# Patient Record
Sex: Female | Born: 1940 | Race: White | Hispanic: No | Marital: Married | Smoking: Former smoker
Health system: Southern US, Community
[De-identification: ages and names within clinical notes are randomized; demographics above are authoritative.]

## PROBLEM LIST (undated history)

## (undated) DIAGNOSIS — I10 Essential (primary) hypertension: Secondary | ICD-10-CM

## (undated) DIAGNOSIS — Z9842 Cataract extraction status, left eye: Secondary | ICD-10-CM

## (undated) DIAGNOSIS — E079 Disorder of thyroid, unspecified: Secondary | ICD-10-CM

## (undated) DIAGNOSIS — I4891 Unspecified atrial fibrillation: Secondary | ICD-10-CM

## (undated) DIAGNOSIS — I219 Acute myocardial infarction, unspecified: Secondary | ICD-10-CM

## (undated) DIAGNOSIS — Z7901 Long term (current) use of anticoagulants: Secondary | ICD-10-CM

## (undated) DIAGNOSIS — H409 Unspecified glaucoma: Secondary | ICD-10-CM

## (undated) DIAGNOSIS — M5030 Other cervical disc degeneration, unspecified cervical region: Secondary | ICD-10-CM

## (undated) DIAGNOSIS — M1990 Unspecified osteoarthritis, unspecified site: Secondary | ICD-10-CM

## (undated) DIAGNOSIS — I700 Atherosclerosis of aorta: Secondary | ICD-10-CM

## (undated) DIAGNOSIS — E785 Hyperlipidemia, unspecified: Secondary | ICD-10-CM

## (undated) DIAGNOSIS — Z9889 Other specified postprocedural states: Secondary | ICD-10-CM

## (undated) DIAGNOSIS — F32A Depression, unspecified: Secondary | ICD-10-CM

## (undated) DIAGNOSIS — D124 Benign neoplasm of descending colon: Secondary | ICD-10-CM

## (undated) DIAGNOSIS — T8859XA Other complications of anesthesia, initial encounter: Secondary | ICD-10-CM

## (undated) DIAGNOSIS — I2510 Atherosclerotic heart disease of native coronary artery without angina pectoris: Secondary | ICD-10-CM

## (undated) DIAGNOSIS — C801 Malignant (primary) neoplasm, unspecified: Secondary | ICD-10-CM

## (undated) DIAGNOSIS — R0600 Dyspnea, unspecified: Secondary | ICD-10-CM

## (undated) DIAGNOSIS — Z9841 Cataract extraction status, right eye: Secondary | ICD-10-CM

## (undated) DIAGNOSIS — R51 Headache: Secondary | ICD-10-CM

## (undated) DIAGNOSIS — F329 Major depressive disorder, single episode, unspecified: Secondary | ICD-10-CM

## (undated) DIAGNOSIS — Z951 Presence of aortocoronary bypass graft: Secondary | ICD-10-CM

## (undated) DIAGNOSIS — U071 COVID-19: Secondary | ICD-10-CM

## (undated) DIAGNOSIS — I639 Cerebral infarction, unspecified: Secondary | ICD-10-CM

## (undated) DIAGNOSIS — L570 Actinic keratosis: Secondary | ICD-10-CM

## (undated) DIAGNOSIS — G8929 Other chronic pain: Secondary | ICD-10-CM

## (undated) DIAGNOSIS — D122 Benign neoplasm of ascending colon: Secondary | ICD-10-CM

## (undated) DIAGNOSIS — T753XXA Motion sickness, initial encounter: Secondary | ICD-10-CM

## (undated) DIAGNOSIS — G43909 Migraine, unspecified, not intractable, without status migrainosus: Secondary | ICD-10-CM

## (undated) DIAGNOSIS — J45909 Unspecified asthma, uncomplicated: Secondary | ICD-10-CM

## (undated) DIAGNOSIS — Z8489 Family history of other specified conditions: Secondary | ICD-10-CM

## (undated) DIAGNOSIS — T4145XA Adverse effect of unspecified anesthetic, initial encounter: Secondary | ICD-10-CM

## (undated) DIAGNOSIS — N183 Chronic kidney disease, stage 3 unspecified: Secondary | ICD-10-CM

## (undated) DIAGNOSIS — G4733 Obstructive sleep apnea (adult) (pediatric): Secondary | ICD-10-CM

## (undated) DIAGNOSIS — R519 Headache, unspecified: Secondary | ICD-10-CM

## (undated) DIAGNOSIS — K219 Gastro-esophageal reflux disease without esophagitis: Secondary | ICD-10-CM

## (undated) DIAGNOSIS — E039 Hypothyroidism, unspecified: Secondary | ICD-10-CM

## (undated) DIAGNOSIS — F419 Anxiety disorder, unspecified: Secondary | ICD-10-CM

## (undated) DIAGNOSIS — R112 Nausea with vomiting, unspecified: Secondary | ICD-10-CM

## (undated) DIAGNOSIS — M797 Fibromyalgia: Secondary | ICD-10-CM

## (undated) DIAGNOSIS — G4730 Sleep apnea, unspecified: Secondary | ICD-10-CM

## (undated) DIAGNOSIS — G4700 Insomnia, unspecified: Secondary | ICD-10-CM

## (undated) DIAGNOSIS — M545 Low back pain, unspecified: Secondary | ICD-10-CM

## (undated) DIAGNOSIS — R9439 Abnormal result of other cardiovascular function study: Secondary | ICD-10-CM

## (undated) DIAGNOSIS — E559 Vitamin D deficiency, unspecified: Secondary | ICD-10-CM

## (undated) HISTORY — PX: APPENDECTOMY: SHX54

## (undated) HISTORY — DX: Malignant (primary) neoplasm, unspecified: C80.1

## (undated) HISTORY — DX: Benign neoplasm of descending colon: D12.4

## (undated) HISTORY — DX: COVID-19: U07.1

## (undated) HISTORY — PX: ABDOMINAL HYSTERECTOMY: SHX81

## (undated) HISTORY — DX: Benign neoplasm of ascending colon: D12.2

## (undated) HISTORY — DX: Depression, unspecified: F32.A

## (undated) HISTORY — PX: RIGHT OOPHORECTOMY: SHX2359

## (undated) HISTORY — PX: CATARACT EXTRACTION W/ INTRAOCULAR LENS  IMPLANT, BILATERAL: SHX1307

## (undated) HISTORY — PX: BLADDER SURGERY: SHX569

## (undated) HISTORY — DX: Essential (primary) hypertension: I10

## (undated) HISTORY — DX: Major depressive disorder, single episode, unspecified: F32.9

## (undated) HISTORY — DX: Unspecified glaucoma: H40.9

## (undated) HISTORY — DX: Cerebral infarction, unspecified: I63.9

## (undated) HISTORY — DX: Disorder of thyroid, unspecified: E07.9

## (undated) HISTORY — DX: Actinic keratosis: L57.0

## (undated) HISTORY — PX: NASAL SINUS SURGERY: SHX719

---

## 1993-03-25 DIAGNOSIS — E785 Hyperlipidemia, unspecified: Secondary | ICD-10-CM

## 1993-03-25 HISTORY — DX: Hyperlipidemia, unspecified: E78.5

## 1993-04-15 DIAGNOSIS — E039 Hypothyroidism, unspecified: Secondary | ICD-10-CM | POA: Insufficient documentation

## 1994-03-23 DIAGNOSIS — M1990 Unspecified osteoarthritis, unspecified site: Secondary | ICD-10-CM | POA: Insufficient documentation

## 2001-09-16 ENCOUNTER — Other Ambulatory Visit: Admission: RE | Admit: 2001-09-16 | Discharge: 2001-09-16 | Payer: Self-pay | Admitting: Family Medicine

## 2004-10-06 ENCOUNTER — Ambulatory Visit: Payer: Self-pay | Admitting: Family Medicine

## 2004-11-14 ENCOUNTER — Ambulatory Visit: Payer: Self-pay | Admitting: General Surgery

## 2005-02-18 ENCOUNTER — Ambulatory Visit: Payer: Self-pay | Admitting: Urology

## 2006-01-20 ENCOUNTER — Ambulatory Visit: Payer: Self-pay | Admitting: Family Medicine

## 2006-10-05 DIAGNOSIS — N959 Unspecified menopausal and perimenopausal disorder: Secondary | ICD-10-CM | POA: Insufficient documentation

## 2006-10-05 DIAGNOSIS — G43909 Migraine, unspecified, not intractable, without status migrainosus: Secondary | ICD-10-CM | POA: Insufficient documentation

## 2006-10-07 DIAGNOSIS — K219 Gastro-esophageal reflux disease without esophagitis: Secondary | ICD-10-CM | POA: Insufficient documentation

## 2007-01-18 ENCOUNTER — Ambulatory Visit: Payer: Self-pay | Admitting: Gastroenterology

## 2007-01-29 DIAGNOSIS — F4320 Adjustment disorder, unspecified: Secondary | ICD-10-CM | POA: Insufficient documentation

## 2007-03-09 DIAGNOSIS — I10 Essential (primary) hypertension: Secondary | ICD-10-CM | POA: Insufficient documentation

## 2007-03-09 HISTORY — DX: Essential (primary) hypertension: I10

## 2007-09-02 ENCOUNTER — Other Ambulatory Visit: Payer: Self-pay

## 2007-09-02 ENCOUNTER — Emergency Department: Payer: Self-pay | Admitting: Emergency Medicine

## 2007-09-29 ENCOUNTER — Encounter: Payer: Self-pay | Admitting: Specialist

## 2007-09-29 DIAGNOSIS — G4700 Insomnia, unspecified: Secondary | ICD-10-CM | POA: Insufficient documentation

## 2007-10-24 ENCOUNTER — Encounter: Payer: Self-pay | Admitting: Specialist

## 2007-11-23 ENCOUNTER — Encounter: Payer: Self-pay | Admitting: Specialist

## 2007-12-24 ENCOUNTER — Encounter: Payer: Self-pay | Admitting: Specialist

## 2008-01-24 ENCOUNTER — Encounter: Payer: Self-pay | Admitting: Specialist

## 2008-02-20 ENCOUNTER — Ambulatory Visit: Payer: Self-pay | Admitting: Family Medicine

## 2008-02-23 ENCOUNTER — Encounter: Payer: Self-pay | Admitting: Specialist

## 2008-04-27 ENCOUNTER — Emergency Department: Payer: Self-pay | Admitting: Emergency Medicine

## 2008-06-09 ENCOUNTER — Observation Stay: Payer: Self-pay | Admitting: Internal Medicine

## 2009-05-03 ENCOUNTER — Ambulatory Visit: Payer: Self-pay | Admitting: Family Medicine

## 2009-05-03 DIAGNOSIS — R7309 Other abnormal glucose: Secondary | ICD-10-CM | POA: Insufficient documentation

## 2009-11-14 DIAGNOSIS — Z86010 Personal history of colonic polyps: Secondary | ICD-10-CM | POA: Insufficient documentation

## 2009-12-31 ENCOUNTER — Ambulatory Visit: Payer: Self-pay | Admitting: Family Medicine

## 2010-03-03 ENCOUNTER — Ambulatory Visit: Payer: Self-pay

## 2010-03-04 ENCOUNTER — Ambulatory Visit: Payer: Self-pay

## 2011-02-03 LAB — HM COLONOSCOPY

## 2011-03-16 ENCOUNTER — Ambulatory Visit: Payer: Self-pay | Admitting: Obstetrics and Gynecology

## 2011-04-10 ENCOUNTER — Ambulatory Visit: Payer: Self-pay | Admitting: Obstetrics and Gynecology

## 2011-04-14 ENCOUNTER — Ambulatory Visit: Payer: Self-pay | Admitting: Obstetrics and Gynecology

## 2011-04-17 LAB — PATHOLOGY REPORT

## 2011-07-31 ENCOUNTER — Ambulatory Visit: Payer: Self-pay | Admitting: Family Medicine

## 2011-07-31 DIAGNOSIS — R509 Fever, unspecified: Secondary | ICD-10-CM | POA: Diagnosis not present

## 2011-07-31 DIAGNOSIS — R059 Cough, unspecified: Secondary | ICD-10-CM | POA: Diagnosis not present

## 2011-07-31 DIAGNOSIS — G43909 Migraine, unspecified, not intractable, without status migrainosus: Secondary | ICD-10-CM | POA: Diagnosis not present

## 2011-07-31 DIAGNOSIS — R05 Cough: Secondary | ICD-10-CM | POA: Diagnosis not present

## 2011-07-31 DIAGNOSIS — IMO0001 Reserved for inherently not codable concepts without codable children: Secondary | ICD-10-CM | POA: Diagnosis not present

## 2011-09-21 DIAGNOSIS — H2510 Age-related nuclear cataract, unspecified eye: Secondary | ICD-10-CM | POA: Diagnosis not present

## 2011-11-09 DIAGNOSIS — C4402 Squamous cell carcinoma of skin of lip: Secondary | ICD-10-CM | POA: Diagnosis not present

## 2011-11-12 DIAGNOSIS — I10 Essential (primary) hypertension: Secondary | ICD-10-CM | POA: Diagnosis not present

## 2011-11-12 DIAGNOSIS — E7800 Pure hypercholesterolemia, unspecified: Secondary | ICD-10-CM | POA: Diagnosis not present

## 2011-11-12 DIAGNOSIS — D72819 Decreased white blood cell count, unspecified: Secondary | ICD-10-CM | POA: Diagnosis not present

## 2011-11-12 DIAGNOSIS — E039 Hypothyroidism, unspecified: Secondary | ICD-10-CM | POA: Diagnosis not present

## 2011-11-12 DIAGNOSIS — E559 Vitamin D deficiency, unspecified: Secondary | ICD-10-CM | POA: Diagnosis not present

## 2011-11-19 DIAGNOSIS — R7309 Other abnormal glucose: Secondary | ICD-10-CM | POA: Diagnosis not present

## 2011-11-19 DIAGNOSIS — Z23 Encounter for immunization: Secondary | ICD-10-CM | POA: Diagnosis not present

## 2011-11-19 DIAGNOSIS — IMO0001 Reserved for inherently not codable concepts without codable children: Secondary | ICD-10-CM | POA: Diagnosis not present

## 2011-11-19 DIAGNOSIS — E7800 Pure hypercholesterolemia, unspecified: Secondary | ICD-10-CM | POA: Diagnosis not present

## 2011-11-19 DIAGNOSIS — E039 Hypothyroidism, unspecified: Secondary | ICD-10-CM | POA: Diagnosis not present

## 2011-12-09 DIAGNOSIS — Z1231 Encounter for screening mammogram for malignant neoplasm of breast: Secondary | ICD-10-CM | POA: Diagnosis not present

## 2011-12-31 DIAGNOSIS — E7800 Pure hypercholesterolemia, unspecified: Secondary | ICD-10-CM | POA: Diagnosis not present

## 2011-12-31 DIAGNOSIS — R7309 Other abnormal glucose: Secondary | ICD-10-CM | POA: Diagnosis not present

## 2012-01-01 DIAGNOSIS — E0590 Thyrotoxicosis, unspecified without thyrotoxic crisis or storm: Secondary | ICD-10-CM | POA: Diagnosis not present

## 2012-01-18 DIAGNOSIS — L820 Inflamed seborrheic keratosis: Secondary | ICD-10-CM | POA: Diagnosis not present

## 2012-01-18 DIAGNOSIS — L821 Other seborrheic keratosis: Secondary | ICD-10-CM | POA: Diagnosis not present

## 2012-01-18 DIAGNOSIS — L578 Other skin changes due to chronic exposure to nonionizing radiation: Secondary | ICD-10-CM | POA: Diagnosis not present

## 2012-01-18 DIAGNOSIS — Z85828 Personal history of other malignant neoplasm of skin: Secondary | ICD-10-CM | POA: Diagnosis not present

## 2012-01-23 DIAGNOSIS — IMO0002 Reserved for concepts with insufficient information to code with codable children: Secondary | ICD-10-CM | POA: Diagnosis not present

## 2012-01-23 DIAGNOSIS — I10 Essential (primary) hypertension: Secondary | ICD-10-CM | POA: Diagnosis not present

## 2012-01-23 DIAGNOSIS — T8140XA Infection following a procedure, unspecified, initial encounter: Secondary | ICD-10-CM | POA: Diagnosis not present

## 2012-02-10 DIAGNOSIS — E039 Hypothyroidism, unspecified: Secondary | ICD-10-CM | POA: Diagnosis not present

## 2012-02-17 DIAGNOSIS — Z23 Encounter for immunization: Secondary | ICD-10-CM | POA: Diagnosis not present

## 2012-03-04 DIAGNOSIS — Z23 Encounter for immunization: Secondary | ICD-10-CM | POA: Diagnosis not present

## 2012-03-04 DIAGNOSIS — E7800 Pure hypercholesterolemia, unspecified: Secondary | ICD-10-CM | POA: Diagnosis not present

## 2012-03-04 DIAGNOSIS — N3000 Acute cystitis without hematuria: Secondary | ICD-10-CM | POA: Diagnosis not present

## 2012-03-15 DIAGNOSIS — E039 Hypothyroidism, unspecified: Secondary | ICD-10-CM | POA: Diagnosis not present

## 2012-03-28 DIAGNOSIS — J40 Bronchitis, not specified as acute or chronic: Secondary | ICD-10-CM | POA: Diagnosis not present

## 2012-03-28 DIAGNOSIS — E7800 Pure hypercholesterolemia, unspecified: Secondary | ICD-10-CM | POA: Diagnosis not present

## 2012-03-28 DIAGNOSIS — Z23 Encounter for immunization: Secondary | ICD-10-CM | POA: Diagnosis not present

## 2012-06-28 DIAGNOSIS — D1800 Hemangioma unspecified site: Secondary | ICD-10-CM | POA: Diagnosis not present

## 2012-06-28 DIAGNOSIS — Z85828 Personal history of other malignant neoplasm of skin: Secondary | ICD-10-CM | POA: Diagnosis not present

## 2012-06-28 DIAGNOSIS — L578 Other skin changes due to chronic exposure to nonionizing radiation: Secondary | ICD-10-CM | POA: Diagnosis not present

## 2012-06-28 DIAGNOSIS — L820 Inflamed seborrheic keratosis: Secondary | ICD-10-CM | POA: Diagnosis not present

## 2012-06-28 DIAGNOSIS — L821 Other seborrheic keratosis: Secondary | ICD-10-CM | POA: Diagnosis not present

## 2012-07-28 DIAGNOSIS — H25019 Cortical age-related cataract, unspecified eye: Secondary | ICD-10-CM | POA: Diagnosis not present

## 2012-08-04 DIAGNOSIS — E7800 Pure hypercholesterolemia, unspecified: Secondary | ICD-10-CM | POA: Diagnosis not present

## 2012-08-04 DIAGNOSIS — Z23 Encounter for immunization: Secondary | ICD-10-CM | POA: Diagnosis not present

## 2012-08-04 DIAGNOSIS — N3000 Acute cystitis without hematuria: Secondary | ICD-10-CM | POA: Diagnosis not present

## 2012-08-10 DIAGNOSIS — H2510 Age-related nuclear cataract, unspecified eye: Secondary | ICD-10-CM | POA: Diagnosis not present

## 2012-08-12 DIAGNOSIS — H33309 Unspecified retinal break, unspecified eye: Secondary | ICD-10-CM | POA: Diagnosis not present

## 2012-08-24 ENCOUNTER — Ambulatory Visit: Payer: Self-pay | Admitting: Ophthalmology

## 2012-08-24 DIAGNOSIS — E039 Hypothyroidism, unspecified: Secondary | ICD-10-CM | POA: Diagnosis not present

## 2012-08-24 DIAGNOSIS — Z79899 Other long term (current) drug therapy: Secondary | ICD-10-CM | POA: Diagnosis not present

## 2012-08-24 DIAGNOSIS — H259 Unspecified age-related cataract: Secondary | ICD-10-CM | POA: Diagnosis not present

## 2012-08-24 DIAGNOSIS — Z85819 Personal history of malignant neoplasm of unspecified site of lip, oral cavity, and pharynx: Secondary | ICD-10-CM | POA: Diagnosis not present

## 2012-08-24 DIAGNOSIS — H2510 Age-related nuclear cataract, unspecified eye: Secondary | ICD-10-CM | POA: Diagnosis not present

## 2012-08-24 DIAGNOSIS — IMO0001 Reserved for inherently not codable concepts without codable children: Secondary | ICD-10-CM | POA: Diagnosis not present

## 2012-08-24 DIAGNOSIS — E7800 Pure hypercholesterolemia, unspecified: Secondary | ICD-10-CM | POA: Diagnosis not present

## 2012-08-24 DIAGNOSIS — K5730 Diverticulosis of large intestine without perforation or abscess without bleeding: Secondary | ICD-10-CM | POA: Diagnosis not present

## 2012-08-24 DIAGNOSIS — F3289 Other specified depressive episodes: Secondary | ICD-10-CM | POA: Diagnosis not present

## 2012-08-24 DIAGNOSIS — F329 Major depressive disorder, single episode, unspecified: Secondary | ICD-10-CM | POA: Diagnosis not present

## 2012-08-24 DIAGNOSIS — F411 Generalized anxiety disorder: Secondary | ICD-10-CM | POA: Diagnosis not present

## 2012-08-24 DIAGNOSIS — Z7982 Long term (current) use of aspirin: Secondary | ICD-10-CM | POA: Diagnosis not present

## 2012-08-24 DIAGNOSIS — I10 Essential (primary) hypertension: Secondary | ICD-10-CM | POA: Diagnosis not present

## 2012-08-24 DIAGNOSIS — G43909 Migraine, unspecified, not intractable, without status migrainosus: Secondary | ICD-10-CM | POA: Diagnosis not present

## 2012-08-24 DIAGNOSIS — K219 Gastro-esophageal reflux disease without esophagitis: Secondary | ICD-10-CM | POA: Diagnosis not present

## 2012-08-24 DIAGNOSIS — M129 Arthropathy, unspecified: Secondary | ICD-10-CM | POA: Diagnosis not present

## 2012-11-04 DIAGNOSIS — H25019 Cortical age-related cataract, unspecified eye: Secondary | ICD-10-CM | POA: Diagnosis not present

## 2012-11-05 ENCOUNTER — Ambulatory Visit: Payer: Self-pay | Admitting: Family Medicine

## 2012-11-05 DIAGNOSIS — G43909 Migraine, unspecified, not intractable, without status migrainosus: Secondary | ICD-10-CM | POA: Diagnosis not present

## 2012-11-05 DIAGNOSIS — Z23 Encounter for immunization: Secondary | ICD-10-CM | POA: Diagnosis not present

## 2012-11-05 DIAGNOSIS — R51 Headache: Secondary | ICD-10-CM | POA: Diagnosis not present

## 2012-11-05 DIAGNOSIS — E7800 Pure hypercholesterolemia, unspecified: Secondary | ICD-10-CM | POA: Diagnosis not present

## 2012-11-16 ENCOUNTER — Ambulatory Visit: Payer: Self-pay | Admitting: Ophthalmology

## 2012-11-16 DIAGNOSIS — I10 Essential (primary) hypertension: Secondary | ICD-10-CM | POA: Diagnosis not present

## 2012-11-16 DIAGNOSIS — H25019 Cortical age-related cataract, unspecified eye: Secondary | ICD-10-CM | POA: Diagnosis not present

## 2012-11-16 DIAGNOSIS — H2510 Age-related nuclear cataract, unspecified eye: Secondary | ICD-10-CM | POA: Diagnosis not present

## 2012-11-16 DIAGNOSIS — G43909 Migraine, unspecified, not intractable, without status migrainosus: Secondary | ICD-10-CM | POA: Diagnosis not present

## 2012-11-16 DIAGNOSIS — M129 Arthropathy, unspecified: Secondary | ICD-10-CM | POA: Diagnosis not present

## 2012-11-16 DIAGNOSIS — H259 Unspecified age-related cataract: Secondary | ICD-10-CM | POA: Diagnosis not present

## 2012-11-16 DIAGNOSIS — M47812 Spondylosis without myelopathy or radiculopathy, cervical region: Secondary | ICD-10-CM | POA: Diagnosis not present

## 2012-11-16 DIAGNOSIS — Z888 Allergy status to other drugs, medicaments and biological substances status: Secondary | ICD-10-CM | POA: Diagnosis not present

## 2012-11-16 DIAGNOSIS — Z880 Allergy status to penicillin: Secondary | ICD-10-CM | POA: Diagnosis not present

## 2012-11-16 DIAGNOSIS — Z79899 Other long term (current) drug therapy: Secondary | ICD-10-CM | POA: Diagnosis not present

## 2012-11-16 DIAGNOSIS — K219 Gastro-esophageal reflux disease without esophagitis: Secondary | ICD-10-CM | POA: Diagnosis not present

## 2012-11-21 DIAGNOSIS — N3000 Acute cystitis without hematuria: Secondary | ICD-10-CM | POA: Diagnosis not present

## 2012-11-21 DIAGNOSIS — E039 Hypothyroidism, unspecified: Secondary | ICD-10-CM | POA: Diagnosis not present

## 2012-11-21 DIAGNOSIS — Z0000 Encounter for general adult medical examination without abnormal findings: Secondary | ICD-10-CM | POA: Diagnosis not present

## 2012-11-21 DIAGNOSIS — D72819 Decreased white blood cell count, unspecified: Secondary | ICD-10-CM | POA: Diagnosis not present

## 2012-11-21 DIAGNOSIS — E7800 Pure hypercholesterolemia, unspecified: Secondary | ICD-10-CM | POA: Diagnosis not present

## 2012-11-21 DIAGNOSIS — E559 Vitamin D deficiency, unspecified: Secondary | ICD-10-CM | POA: Diagnosis not present

## 2012-11-21 DIAGNOSIS — G4700 Insomnia, unspecified: Secondary | ICD-10-CM | POA: Diagnosis not present

## 2012-12-01 DIAGNOSIS — M545 Low back pain, unspecified: Secondary | ICD-10-CM | POA: Diagnosis not present

## 2012-12-01 DIAGNOSIS — IMO0002 Reserved for concepts with insufficient information to code with codable children: Secondary | ICD-10-CM | POA: Diagnosis not present

## 2012-12-01 DIAGNOSIS — M1990 Unspecified osteoarthritis, unspecified site: Secondary | ICD-10-CM | POA: Diagnosis not present

## 2012-12-08 ENCOUNTER — Ambulatory Visit: Payer: Self-pay | Admitting: Orthopedic Surgery

## 2012-12-14 ENCOUNTER — Ambulatory Visit: Payer: Self-pay | Admitting: Family Medicine

## 2012-12-14 DIAGNOSIS — Z1231 Encounter for screening mammogram for malignant neoplasm of breast: Secondary | ICD-10-CM | POA: Diagnosis not present

## 2012-12-15 ENCOUNTER — Ambulatory Visit: Payer: Self-pay | Admitting: Orthopedic Surgery

## 2012-12-15 DIAGNOSIS — M47812 Spondylosis without myelopathy or radiculopathy, cervical region: Secondary | ICD-10-CM | POA: Diagnosis not present

## 2012-12-15 DIAGNOSIS — D1779 Benign lipomatous neoplasm of other sites: Secondary | ICD-10-CM | POA: Diagnosis not present

## 2012-12-15 DIAGNOSIS — S46919A Strain of unspecified muscle, fascia and tendon at shoulder and upper arm level, unspecified arm, initial encounter: Secondary | ICD-10-CM | POA: Diagnosis not present

## 2012-12-15 DIAGNOSIS — M67919 Unspecified disorder of synovium and tendon, unspecified shoulder: Secondary | ICD-10-CM | POA: Diagnosis not present

## 2012-12-15 DIAGNOSIS — M25519 Pain in unspecified shoulder: Secondary | ICD-10-CM | POA: Diagnosis not present

## 2012-12-15 DIAGNOSIS — M719 Bursopathy, unspecified: Secondary | ICD-10-CM | POA: Diagnosis not present

## 2012-12-20 DIAGNOSIS — L659 Nonscarring hair loss, unspecified: Secondary | ICD-10-CM | POA: Diagnosis not present

## 2012-12-20 DIAGNOSIS — Z85828 Personal history of other malignant neoplasm of skin: Secondary | ICD-10-CM | POA: Diagnosis not present

## 2012-12-20 DIAGNOSIS — L578 Other skin changes due to chronic exposure to nonionizing radiation: Secondary | ICD-10-CM | POA: Diagnosis not present

## 2012-12-20 DIAGNOSIS — D1800 Hemangioma unspecified site: Secondary | ICD-10-CM | POA: Diagnosis not present

## 2012-12-20 DIAGNOSIS — D239 Other benign neoplasm of skin, unspecified: Secondary | ICD-10-CM | POA: Diagnosis not present

## 2012-12-20 DIAGNOSIS — L820 Inflamed seborrheic keratosis: Secondary | ICD-10-CM | POA: Diagnosis not present

## 2012-12-20 DIAGNOSIS — L821 Other seborrheic keratosis: Secondary | ICD-10-CM | POA: Diagnosis not present

## 2012-12-30 DIAGNOSIS — IMO0002 Reserved for concepts with insufficient information to code with codable children: Secondary | ICD-10-CM | POA: Diagnosis not present

## 2012-12-30 DIAGNOSIS — M545 Low back pain, unspecified: Secondary | ICD-10-CM | POA: Diagnosis not present

## 2012-12-30 DIAGNOSIS — D179 Benign lipomatous neoplasm, unspecified: Secondary | ICD-10-CM | POA: Diagnosis not present

## 2013-01-07 DIAGNOSIS — I10 Essential (primary) hypertension: Secondary | ICD-10-CM | POA: Diagnosis not present

## 2013-01-07 DIAGNOSIS — R5383 Other fatigue: Secondary | ICD-10-CM | POA: Diagnosis not present

## 2013-01-07 DIAGNOSIS — N390 Urinary tract infection, site not specified: Secondary | ICD-10-CM | POA: Diagnosis not present

## 2013-01-07 DIAGNOSIS — R5381 Other malaise: Secondary | ICD-10-CM | POA: Diagnosis not present

## 2013-01-07 DIAGNOSIS — R109 Unspecified abdominal pain: Secondary | ICD-10-CM | POA: Diagnosis not present

## 2013-01-24 DIAGNOSIS — Z23 Encounter for immunization: Secondary | ICD-10-CM | POA: Diagnosis not present

## 2013-03-14 DIAGNOSIS — H6980 Other specified disorders of Eustachian tube, unspecified ear: Secondary | ICD-10-CM | POA: Diagnosis not present

## 2013-03-14 DIAGNOSIS — H6120 Impacted cerumen, unspecified ear: Secondary | ICD-10-CM | POA: Diagnosis not present

## 2013-03-14 DIAGNOSIS — R42 Dizziness and giddiness: Secondary | ICD-10-CM | POA: Diagnosis not present

## 2013-03-14 DIAGNOSIS — H9209 Otalgia, unspecified ear: Secondary | ICD-10-CM | POA: Diagnosis not present

## 2013-06-26 DIAGNOSIS — R5381 Other malaise: Secondary | ICD-10-CM | POA: Diagnosis not present

## 2013-06-26 DIAGNOSIS — N390 Urinary tract infection, site not specified: Secondary | ICD-10-CM | POA: Diagnosis not present

## 2013-06-26 DIAGNOSIS — I10 Essential (primary) hypertension: Secondary | ICD-10-CM | POA: Diagnosis not present

## 2013-06-26 DIAGNOSIS — R609 Edema, unspecified: Secondary | ICD-10-CM | POA: Diagnosis not present

## 2013-06-27 DIAGNOSIS — H43819 Vitreous degeneration, unspecified eye: Secondary | ICD-10-CM | POA: Diagnosis not present

## 2013-06-29 DIAGNOSIS — Z85828 Personal history of other malignant neoplasm of skin: Secondary | ICD-10-CM | POA: Diagnosis not present

## 2013-06-29 DIAGNOSIS — L719 Rosacea, unspecified: Secondary | ICD-10-CM | POA: Diagnosis not present

## 2013-10-18 DIAGNOSIS — L821 Other seborrheic keratosis: Secondary | ICD-10-CM | POA: Diagnosis not present

## 2013-10-18 DIAGNOSIS — Z85828 Personal history of other malignant neoplasm of skin: Secondary | ICD-10-CM | POA: Diagnosis not present

## 2013-10-18 DIAGNOSIS — D1800 Hemangioma unspecified site: Secondary | ICD-10-CM | POA: Diagnosis not present

## 2013-10-18 DIAGNOSIS — L578 Other skin changes due to chronic exposure to nonionizing radiation: Secondary | ICD-10-CM | POA: Diagnosis not present

## 2013-10-18 DIAGNOSIS — L719 Rosacea, unspecified: Secondary | ICD-10-CM | POA: Diagnosis not present

## 2013-10-18 DIAGNOSIS — D239 Other benign neoplasm of skin, unspecified: Secondary | ICD-10-CM | POA: Diagnosis not present

## 2013-11-22 DIAGNOSIS — I10 Essential (primary) hypertension: Secondary | ICD-10-CM | POA: Diagnosis not present

## 2013-11-22 DIAGNOSIS — E7800 Pure hypercholesterolemia, unspecified: Secondary | ICD-10-CM | POA: Diagnosis not present

## 2013-11-22 DIAGNOSIS — R109 Unspecified abdominal pain: Secondary | ICD-10-CM | POA: Diagnosis not present

## 2013-11-22 DIAGNOSIS — Z23 Encounter for immunization: Secondary | ICD-10-CM | POA: Diagnosis not present

## 2013-11-22 DIAGNOSIS — Z0000 Encounter for general adult medical examination without abnormal findings: Secondary | ICD-10-CM | POA: Diagnosis not present

## 2013-11-22 DIAGNOSIS — R7309 Other abnormal glucose: Secondary | ICD-10-CM | POA: Diagnosis not present

## 2013-11-22 DIAGNOSIS — IMO0001 Reserved for inherently not codable concepts without codable children: Secondary | ICD-10-CM | POA: Diagnosis not present

## 2013-11-22 DIAGNOSIS — Z124 Encounter for screening for malignant neoplasm of cervix: Secondary | ICD-10-CM | POA: Diagnosis not present

## 2013-11-22 DIAGNOSIS — E039 Hypothyroidism, unspecified: Secondary | ICD-10-CM | POA: Diagnosis not present

## 2013-11-22 LAB — HEPATIC FUNCTION PANEL
ALT: 13 U/L (ref 7–35)
AST: 15 U/L (ref 13–35)

## 2013-11-22 LAB — BASIC METABOLIC PANEL
BUN: 18 mg/dL (ref 4–21)
Creatinine: 0.8 mg/dL (ref <=1.1)
Glucose: 104 mg/dL
Potassium: 4.5 mmol/L (ref 3.4–5.3)
Sodium: 138 mmol/L (ref 137–147)

## 2013-11-22 LAB — LIPID PANEL
Cholesterol: 230 mg/dL — AB (ref 0–200)
HDL: 44 mg/dL (ref 35–70)
LDL Cholesterol: 143 mg/dL
Triglycerides: 215 mg/dL — AB (ref 40–160)

## 2013-11-22 LAB — HEMOGLOBIN A1C: Hgb A1c MFr Bld: 5.9 % (ref 4.0–6.0)

## 2013-11-22 LAB — HM PAP SMEAR: HM PAP: NORMAL

## 2013-11-22 LAB — CBC AND DIFFERENTIAL
HEMATOCRIT: 42 % (ref 36–46)
HEMOGLOBIN: 14.3 g/dL (ref 12.0–16.0)
PLATELETS: 197 10*3/uL (ref 150–399)
WBC: 4.8 10^3/mL

## 2013-11-30 ENCOUNTER — Ambulatory Visit: Payer: Self-pay | Admitting: Family Medicine

## 2013-11-30 DIAGNOSIS — R109 Unspecified abdominal pain: Secondary | ICD-10-CM | POA: Diagnosis not present

## 2013-11-30 DIAGNOSIS — Z9079 Acquired absence of other genital organ(s): Secondary | ICD-10-CM | POA: Diagnosis not present

## 2013-11-30 DIAGNOSIS — R141 Gas pain: Secondary | ICD-10-CM | POA: Diagnosis not present

## 2013-12-04 ENCOUNTER — Ambulatory Visit: Payer: Self-pay | Admitting: Family Medicine

## 2013-12-04 DIAGNOSIS — R109 Unspecified abdominal pain: Secondary | ICD-10-CM | POA: Diagnosis not present

## 2013-12-04 DIAGNOSIS — R141 Gas pain: Secondary | ICD-10-CM | POA: Diagnosis not present

## 2013-12-04 DIAGNOSIS — R112 Nausea with vomiting, unspecified: Secondary | ICD-10-CM | POA: Diagnosis not present

## 2013-12-21 ENCOUNTER — Ambulatory Visit: Payer: Self-pay | Admitting: Family Medicine

## 2013-12-21 DIAGNOSIS — M949 Disorder of cartilage, unspecified: Secondary | ICD-10-CM | POA: Diagnosis not present

## 2013-12-21 DIAGNOSIS — M899 Disorder of bone, unspecified: Secondary | ICD-10-CM | POA: Diagnosis not present

## 2013-12-21 DIAGNOSIS — N959 Unspecified menopausal and perimenopausal disorder: Secondary | ICD-10-CM | POA: Diagnosis not present

## 2013-12-21 LAB — HM DEXA SCAN

## 2014-01-05 DIAGNOSIS — IMO0001 Reserved for inherently not codable concepts without codable children: Secondary | ICD-10-CM | POA: Diagnosis not present

## 2014-01-05 DIAGNOSIS — Z23 Encounter for immunization: Secondary | ICD-10-CM | POA: Diagnosis not present

## 2014-01-05 DIAGNOSIS — I10 Essential (primary) hypertension: Secondary | ICD-10-CM | POA: Diagnosis not present

## 2014-01-05 DIAGNOSIS — R109 Unspecified abdominal pain: Secondary | ICD-10-CM | POA: Diagnosis not present

## 2014-01-05 DIAGNOSIS — Z0000 Encounter for general adult medical examination without abnormal findings: Secondary | ICD-10-CM | POA: Diagnosis not present

## 2014-01-19 DIAGNOSIS — Z23 Encounter for immunization: Secondary | ICD-10-CM | POA: Diagnosis not present

## 2014-03-01 ENCOUNTER — Encounter: Payer: Self-pay | Admitting: Podiatry

## 2014-03-01 ENCOUNTER — Ambulatory Visit (INDEPENDENT_AMBULATORY_CARE_PROVIDER_SITE_OTHER): Payer: Medicare Other | Admitting: Podiatry

## 2014-03-01 ENCOUNTER — Ambulatory Visit: Payer: Self-pay

## 2014-03-01 VITALS — BP 134/73 | HR 79 | Resp 16 | Ht 61.5 in | Wt 175.0 lb

## 2014-03-01 DIAGNOSIS — M722 Plantar fascial fibromatosis: Secondary | ICD-10-CM

## 2014-03-01 NOTE — Progress Notes (Signed)
   Subjective:    Patient ID: Stacey [name], female    DOB: 1940/09/30, 73 y.o.   MRN: [id number]  HPI Comments: Ms. Sterne, 73 year old female, presents the office today with complaints of right heel pain. She states it has been ongoing for a couple months it has become more consistent over the last month. She states that there is a throbbing dull ache on the bottom of her foot. She states that standing weightbearing makes the pain worse but is painful most the time. 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 States that she has swelling on the outside aspect of her heel. She previously has been treated for plantar fasciitis for which she states that she underwent multiple steroid injections and immobilization. She denies any specific history of injury or trauma to the area. No other complaints at this time.   Foot Pain      Review of Systems  All other systems reviewed and are negative.      Objective:   Physical Exam AAO x3, NAD DP/PT pulses palpable b/l. CRT < 3 sec Protective sensation intact with Simms Weinstein monofilament, vibratory sensation intact, Achilles tendon reflex. Tenderness to palpation of the plantar medial band of the plantar fascia distal to the insertion on the calcaneus. There is edema over the lateral aspect of the heel with tenderness over this area. There is mild discomfort with vibratory sensation however no specific tenderness. Tenderness over lateral aspect of calcaneus. Decrease in medial arch height. No pain along the posterior aspect of the calcaneus. No calf pain, swelling, warmth. No open lesions. MMT 5/5, ROM WNL       Assessment & Plan:  73 year old female with likely right foot plantar fasciitis however cannot rule out possible stress fracture given pain and swelling on lateral aspect of the calcaneus. -X-rays were obtained and reviewed the patient. No acute fractures. -Conservative versus surgical  treatment were discussed including alternatives, risks, complications. -Given the amount of pain in the location of the lateral aspect of the calcaneus cannot rule out stress fracture. At this time will immobilize and a cam walker for period time. Will rex-ray at next appointment. If symptoms persist we'll consider MRI. -Ice to the effected area. -Signs and symptoms of DVT and other complications of immobilization were discussed the patient. Directed to the emergency room if any are to occur. -Followup in 2 weeks or sooner if any problems are to arise or any change in symptoms. In the meantime call the office with any questions, concerns, change in symptoms to

## 2014-03-01 NOTE — Patient Instructions (Signed)
Cryotherapy °Cryotherapy means treatment with cold. Ice or gel packs can be used to reduce both pain and swelling. Ice is the most helpful within the first 24 to 48 hours after an injury or flare-up from overusing a muscle or joint. Sprains, strains, spasms, burning pain, shooting pain, and aches can all be eased with ice. Ice can also be used when recovering from surgery. Ice is effective, has very few side effects, and is safe for most people to use. °PRECAUTIONS  °Ice is not a safe treatment option for people with: °· Raynaud phenomenon. This is a condition affecting small blood vessels in the extremities. Exposure to cold may cause your problems to return. °· Cold hypersensitivity. There are many forms of cold hypersensitivity, including: °¨ Cold urticaria. Red, itchy hives appear on the skin when the tissues begin to warm after being iced. °¨ Cold erythema. This is a red, itchy rash caused by exposure to cold. °¨ Cold hemoglobinuria. Red blood cells break down when the tissues begin to warm after being iced. The hemoglobin that carry oxygen are passed into the urine because they cannot combine with blood proteins fast enough. °· Numbness or altered sensitivity in the area being iced. °If you have any of the following conditions, do not use ice until you have discussed cryotherapy with your caregiver: °· Heart conditions, such as arrhythmia, angina, or chronic heart disease. °· High blood pressure. °· Healing wounds or open skin in the area being iced. °· Current infections. °· Rheumatoid arthritis. °· Poor circulation. °· Diabetes. °Ice slows the blood flow in the region it is applied. This is beneficial when trying to stop inflamed tissues from spreading irritating chemicals to surrounding tissues. However, if you expose your skin to cold temperatures for too long or without the proper protection, you can damage your skin or nerves. Watch for signs of skin damage due to cold. °HOME CARE INSTRUCTIONS °Follow  these tips to use ice and cold packs safely. °· Place a dry or damp towel between the ice and skin. A damp towel will cool the skin more quickly, so you may need to shorten the time that the ice is used. °· For a more rapid response, add gentle compression to the ice. °· Ice for no more than 10 to 20 minutes at a time. The bonier the area you are icing, the less time it will take to get the benefits of ice. °· Check your skin after 5 minutes to make sure there are no signs of a poor response to cold or skin damage. °· Rest 20 minutes or more between uses. °· Once your skin is numb, you can end your treatment. You can test numbness by very lightly touching your skin. The touch should be so light that you do not see the skin dimple from the pressure of your fingertip. When using ice, most people will feel these normal sensations in this order: cold, burning, aching, and numbness. °· Do not use ice on someone who cannot communicate their responses to pain, such as small children or people with dementia. °HOW TO MAKE AN ICE PACK °Ice packs are the most common way to use ice therapy. Other methods include ice massage, ice baths, and cryosprays. Muscle creams that cause a cold, tingly feeling do not offer the same benefits that ice offers and should not be used as a substitute unless recommended by your caregiver. °To make an ice pack, do one of the following: °· Place crushed ice or a   bag of frozen vegetables in a sealable plastic bag. Squeeze out the excess air. Place this bag inside another plastic bag. Slide the bag into a pillowcase or place a damp towel between your skin and the bag. °· Mix 3 parts water with 1 part rubbing alcohol. Freeze the mixture in a sealable plastic bag. When you remove the mixture from the freezer, it will be slushy. Squeeze out the excess air. Place this bag inside another plastic bag. Slide the bag into a pillowcase or place a damp towel between your skin and the bag. °SEEK MEDICAL CARE  IF: °· You develop white spots on your skin. This may give the skin a blotchy (mottled) appearance. °· Your skin turns blue or pale. °· Your skin becomes waxy or hard. °· Your swelling gets worse. °MAKE SURE YOU:  °· Understand these instructions. °· Will watch your condition. °· Will get help right away if you are not doing well or get worse. °Document Released: 01/05/2011 Document Revised: 09/25/2013 Document Reviewed: 01/05/2011 °ExitCare® Patient Information ©2015 [employer]. This information is not intended to replace advice given to you by your health care provider. Make sure you discuss any questions you have with your health care provider. ° °

## 2014-03-15 ENCOUNTER — Ambulatory Visit (INDEPENDENT_AMBULATORY_CARE_PROVIDER_SITE_OTHER): Payer: Medicare Other | Admitting: Podiatry

## 2014-03-15 ENCOUNTER — Ambulatory Visit: Payer: Self-pay

## 2014-03-15 ENCOUNTER — Encounter: Payer: Self-pay | Admitting: Podiatry

## 2014-03-15 VITALS — BP 159/82 | HR 68 | Resp 17

## 2014-03-15 DIAGNOSIS — M79671 Pain in right foot: Secondary | ICD-10-CM

## 2014-03-15 DIAGNOSIS — M722 Plantar fascial fibromatosis: Secondary | ICD-10-CM

## 2014-03-15 NOTE — Progress Notes (Signed)
Patient ID: Stacey [name], female   DOB: [birth year], 73 y.o.   MRN: [id number]  Subjective: Stacey [name] returns the office they for followup evaluation of right heel pain. She states that she has since gone on a cruise since last appointment. She has been continuing with the cam boot except for today. She states that she has improvement in symptoms while wearing the boot. She states that when she does take the boot off and put pressure she continues to have pain on the outside aspect of her right heel. Also states that when she comes out of the boot she has increased swelling to the area. She denies any recent injury or trauma to the area. No other complaints at this time. No acute changes since last appointment.  Objective: AAO x3, NAD DP/PT pulses palpable bilaterally, CRT less than 3 seconds Protective sensation intact with Simms Weinstein monofilament, vibratory sensation intact, Achilles tendon reflex intact Tenderness to palpation over the plantar medial band of the plantar fascia just distal to the insertion of the calcaneus. There is mild discomfort over the lateral aspect of the calcaneus near the inferior aspect. There is mild edema over the lateral aspect of the heel. There is no pain along the posterior aspect of the calcaneus. There is no pain the vibratory sensation. MMT 5/5, ROM WNL No open lesions or pre-ulcerative lesions No calf pain, swelling, warmth  Assessment: 73 year old female with likely right foot plantar fasciitis however possible stress fracture  Plan: -X-rays were obtained and reviewed with the patient. No acute changes his last appointment. -Various treatments discussed including alternatives, risks, complications. -Discussed possible MRI however the patient does not want to do this at this time. -Continue in CAM boot. Risks and complications of immobilization including but not limited to DVT discussed with the patient. She was directed to go to the emergency  room if any are to occur to -Ice to the effected area -Followup in 3 weeks or sooner if any problems are to arise or any changes symptoms. In the meantime call the office with any questions, concerns.

## 2014-04-05 ENCOUNTER — Ambulatory Visit (INDEPENDENT_AMBULATORY_CARE_PROVIDER_SITE_OTHER): Payer: Medicare Other | Admitting: Podiatry

## 2014-04-05 VITALS — BP 150/83 | HR 75 | Resp 16

## 2014-04-05 DIAGNOSIS — M722 Plantar fascial fibromatosis: Secondary | ICD-10-CM | POA: Diagnosis not present

## 2014-04-05 NOTE — Patient Instructions (Signed)
Plantar Fasciitis (Heel Spur Syndrome) with Rehab The plantar fascia is a fibrous, ligament-like, soft-tissue structure that spans the bottom of the foot. Plantar fasciitis is a condition that causes pain in the foot due to inflammation of the tissue. SYMPTOMS   Pain and tenderness on the underneath side of the foot.  Pain that worsens with standing or walking. CAUSES  Plantar fasciitis is caused by irritation and injury to the plantar fascia on the underneath side of the foot. Common mechanisms of injury include:  Direct trauma to bottom of the foot.  Damage to a small nerve that runs under the foot where the main fascia attaches to the heel bone.  Stress placed on the plantar fascia due to bone spurs. RISK INCREASES WITH:   Activities that place stress on the plantar fascia (running, jumping, pivoting, or cutting).  Poor strength and flexibility.  Improperly fitted shoes.  Tight calf muscles.  Flat feet.  Failure to warm-up properly before activity.  Obesity. PREVENTION  Warm up and stretch properly before activity.  Allow for adequate recovery between workouts.  Maintain physical fitness:  Strength, flexibility, and endurance.  Cardiovascular fitness.  Maintain a health body weight.  Avoid stress on the plantar fascia.  Wear properly fitted shoes, including arch supports for individuals who have flat feet. PROGNOSIS  If treated properly, then the symptoms of plantar fasciitis usually resolve without surgery. However, occasionally surgery is necessary. RELATED COMPLICATIONS   Recurrent symptoms that may result in a chronic condition.  Problems of the lower back that are caused by compensating for the injury, such as limping.  Pain or weakness of the foot during push-off following surgery.  Chronic inflammation, scarring, and partial or complete fascia tear, occurring more often from repeated injections. TREATMENT  Treatment initially involves the use of  ice and medication to help reduce pain and inflammation. The use of strengthening and stretching exercises may help reduce pain with activity, especially stretches of the Achilles tendon. These exercises may be performed at home or with a therapist. Your caregiver may recommend that you use heel cups of arch supports to help reduce stress on the plantar fascia. Occasionally, corticosteroid injections are given to reduce inflammation. If symptoms persist for greater than 6 months despite non-surgical (conservative), then surgery may be recommended.  MEDICATION   If pain medication is necessary, then nonsteroidal anti-inflammatory medications, such as aspirin and ibuprofen, or other minor pain relievers, such as acetaminophen, are often recommended.  Do not take pain medication within 7 days before surgery.  Prescription pain relievers may be given if deemed necessary by your caregiver. Use only as directed and only as much as you need.  Corticosteroid injections may be given by your caregiver. These injections should be reserved for the most serious cases, because they may only be given a certain number of times. HEAT AND COLD  Cold treatment (icing) relieves pain and reduces inflammation. Cold treatment should be applied for 10 to 15 minutes every 2 to 3 hours for inflammation and pain and immediately after any activity that aggravates your symptoms. Use ice packs or massage the area with a piece of ice (ice massage).  Heat treatment may be used prior to performing the stretching and strengthening activities prescribed by your caregiver, physical therapist, or athletic trainer. Use a heat pack or soak the injury in warm water. SEEK IMMEDIATE MEDICAL CARE IF:  Treatment seems to offer no benefit, or the condition worsens.  Any medications produce adverse side effects. EXERCISES RANGE   OF MOTION (ROM) AND STRETCHING EXERCISES - Plantar Fasciitis (Heel Spur Syndrome) These exercises may help you  when beginning to rehabilitate your injury. Your symptoms may resolve with or without further involvement from your physician, physical therapist or athletic trainer. While completing these exercises, remember:   Restoring tissue flexibility helps normal motion to return to the joints. This allows healthier, less painful movement and activity.  An effective stretch should be held for at least 30 seconds.  A stretch should never be painful. You should only feel a gentle lengthening or release in the stretched tissue. RANGE OF MOTION - Toe Extension, Flexion  Sit with your right / left leg crossed over your opposite knee.  Grasp your toes and gently pull them back toward the top of your foot. You should feel a stretch on the bottom of your toes and/or foot.  Hold this stretch for __________ seconds.  Now, gently pull your toes toward the bottom of your foot. You should feel a stretch on the top of your toes and or foot.  Hold this stretch for __________ seconds. Repeat __________ times. Complete this stretch __________ times per day.  RANGE OF MOTION - Ankle Dorsiflexion, Active Assisted  Remove shoes and sit on a chair that is preferably not on a carpeted surface.  Place right / left foot under knee. Extend your opposite leg for support.  Keeping your heel down, slide your right / left foot back toward the chair until you feel a stretch at your ankle or calf. If you do not feel a stretch, slide your bottom forward to the edge of the chair, while still keeping your heel down.  Hold this stretch for __________ seconds. Repeat __________ times. Complete this stretch __________ times per day.  STRETCH - Gastroc, Standing  Place hands on wall.  Extend right / left leg, keeping the front knee somewhat bent.  Slightly point your toes inward on your back foot.  Keeping your right / left heel on the floor and your knee straight, shift your weight toward the wall, not allowing your back to  arch.  You should feel a gentle stretch in the right / left calf. Hold this position for __________ seconds. Repeat __________ times. Complete this stretch __________ times per day. STRETCH - Soleus, Standing  Place hands on wall.  Extend right / left leg, keeping the other knee somewhat bent.  Slightly point your toes inward on your back foot.  Keep your right / left heel on the floor, bend your back knee, and slightly shift your weight over the back leg so that you feel a gentle stretch deep in your back calf.  Hold this position for __________ seconds. Repeat __________ times. Complete this stretch __________ times per day. STRETCH - Gastrocsoleus, Standing  Note: This exercise can place a lot of stress on your foot and ankle. Please complete this exercise only if specifically instructed by your caregiver.   Place the ball of your right / left foot on a step, keeping your other foot firmly on the same step.  Hold on to the wall or a rail for balance.  Slowly lift your other foot, allowing your body weight to press your heel down over the edge of the step.  You should feel a stretch in your right / left calf.  Hold this position for __________ seconds.  Repeat this exercise with a slight bend in your right / left knee. Repeat __________ times. Complete this stretch __________ times per day.    STRENGTHENING EXERCISES - Plantar Fasciitis (Heel Spur Syndrome)  These exercises may help you when beginning to rehabilitate your injury. They may resolve your symptoms with or without further involvement from your physician, physical therapist or athletic trainer. While completing these exercises, remember:   Muscles can gain both the endurance and the strength needed for everyday activities through controlled exercises.  Complete these exercises as instructed by your physician, physical therapist or athletic trainer. Progress the resistance and repetitions only as guided. STRENGTH -  Towel Curls  Sit in a chair positioned on a non-carpeted surface.  Place your foot on a towel, keeping your heel on the floor.  Pull the towel toward your heel by only curling your toes. Keep your heel on the floor.  If instructed by your physician, physical therapist or athletic trainer, add ____________________ at the end of the towel. Repeat __________ times. Complete this exercise __________ times per day. STRENGTH - Ankle Inversion  Secure one end of a rubber exercise band/tubing to a fixed object (table, pole). Loop the other end around your foot just before your toes.  Place your fists between your knees. This will focus your strengthening at your ankle.  Slowly, pull your big toe up and in, making sure the band/tubing is positioned to resist the entire motion.  Hold this position for __________ seconds.  Have your muscles resist the band/tubing as it slowly pulls your foot back to the starting position. Repeat __________ times. Complete this exercises __________ times per day.  Document Released: 05/11/2005 Document Revised: 08/03/2011 Document Reviewed: 08/23/2008 Exit[employer] Patient Information 2015 [employer]. This information is not intended to replace advice given to you by your health care provider. Make sure you discuss any questions you have with your health care provider.  

## 2014-04-08 NOTE — Progress Notes (Signed)
Patient ID: Stacey [name], female   DOB: [birth year], 73 y.o.   MRN: [id number]  Subjective: 73 year old female returns the office they for follow-up evaluation of right foot heel pain. She states that over the last week she has started to transition out of the [employer] back into a regular shoe. She states that while wearing the cam walker she has no pain however since the transition back into the shoe she has some mild discomfort at times. She states that if it starts to hurt too much that she goes back into the boot. She also states that she has a physical therapist that she sees once a week for her fibromyalgia and she had her physical therapist start working with the foot as well. She denies any acute changes since last appointment. No other complaints at this time. Denies any systemic complaints such as fevers, chills, nausea, vomiting.  Objective: AAO x3, NAD DP/PT pulses palpable bilaterally, CRT less than 3 seconds Protective sensation intact with Simms Weinstein monofilament, vibratory sensation intact, Achilles tendon reflex intact Mild tenderness palpation over the plantar medial aspect of the calcaneus at the insertion the plantar fascia as well as just distal to the insertion. There is no pain with lateral compression of the calcaneus or with vibratory sensation. No pain on the posterior aspect or along the course of the Achilles tendon. There is no overlying edema, erythema, increase in warmth. There are no areas of pinpoint bony tenderness. MMT 5/5, ROM WNL No open lesions or pre-ulcerative lesions. No calf pain, swelling, warmth, erythema.  Assessment: 73 year old female with right foot heel pain, likely plantar fasciitis resolving.  Plan: -Treatment options were discussed with the patient including alternatives, risks, complications. -The patient still does not want to proceed with any further imaging at this time. -I discussed with her that she can continue transition out of  the Cam Walker back into a shoe as tolerated and to continue stretching exercises as well as icing to the effected area. Also she can have her physical therapist work with her and I provided a prescription today. -Follow-up in 4 weeks, or sooner if any problems arise. Call the office with any questions, concerns, changes symptoms.

## 2014-04-16 ENCOUNTER — Emergency Department: Payer: Self-pay | Admitting: Emergency Medicine

## 2014-04-16 DIAGNOSIS — S139XXA Sprain of joints and ligaments of unspecified parts of neck, initial encounter: Secondary | ICD-10-CM | POA: Diagnosis not present

## 2014-04-16 DIAGNOSIS — M25579 Pain in unspecified ankle and joints of unspecified foot: Secondary | ICD-10-CM | POA: Diagnosis not present

## 2014-04-16 DIAGNOSIS — S060X0A Concussion without loss of consciousness, initial encounter: Secondary | ICD-10-CM | POA: Diagnosis not present

## 2014-04-16 DIAGNOSIS — S90112A Contusion of left great toe without damage to nail, initial encounter: Secondary | ICD-10-CM | POA: Diagnosis not present

## 2014-04-16 DIAGNOSIS — Z880 Allergy status to penicillin: Secondary | ICD-10-CM | POA: Diagnosis not present

## 2014-04-16 DIAGNOSIS — M549 Dorsalgia, unspecified: Secondary | ICD-10-CM | POA: Diagnosis not present

## 2014-04-16 DIAGNOSIS — S29002A Unspecified injury of muscle and tendon of back wall of thorax, initial encounter: Secondary | ICD-10-CM | POA: Diagnosis not present

## 2014-04-16 DIAGNOSIS — W19XXXA Unspecified fall, initial encounter: Secondary | ICD-10-CM | POA: Diagnosis not present

## 2014-04-16 DIAGNOSIS — R51 Headache: Secondary | ICD-10-CM | POA: Diagnosis not present

## 2014-04-16 DIAGNOSIS — M542 Cervicalgia: Secondary | ICD-10-CM | POA: Diagnosis not present

## 2014-04-16 DIAGNOSIS — S134XXA Sprain of ligaments of cervical spine, initial encounter: Secondary | ICD-10-CM | POA: Diagnosis not present

## 2014-04-16 DIAGNOSIS — T25222A Burn of second degree of left foot, initial encounter: Secondary | ICD-10-CM | POA: Diagnosis not present

## 2014-04-16 DIAGNOSIS — S299XXA Unspecified injury of thorax, initial encounter: Secondary | ICD-10-CM | POA: Diagnosis not present

## 2014-04-16 DIAGNOSIS — M47812 Spondylosis without myelopathy or radiculopathy, cervical region: Secondary | ICD-10-CM | POA: Diagnosis not present

## 2014-04-20 ENCOUNTER — Ambulatory Visit: Payer: Self-pay | Admitting: Family Medicine

## 2014-04-20 DIAGNOSIS — R0781 Pleurodynia: Secondary | ICD-10-CM | POA: Diagnosis not present

## 2014-04-20 DIAGNOSIS — M940 Chondrocostal junction syndrome [Tietze]: Secondary | ICD-10-CM | POA: Diagnosis not present

## 2014-04-20 DIAGNOSIS — R0602 Shortness of breath: Secondary | ICD-10-CM | POA: Diagnosis not present

## 2014-04-23 DIAGNOSIS — R0602 Shortness of breath: Secondary | ICD-10-CM | POA: Diagnosis not present

## 2014-04-23 DIAGNOSIS — S90922A Unspecified superficial injury of left foot, initial encounter: Secondary | ICD-10-CM | POA: Diagnosis not present

## 2014-04-23 DIAGNOSIS — T25222A Burn of second degree of left foot, initial encounter: Secondary | ICD-10-CM | POA: Diagnosis not present

## 2014-04-30 DIAGNOSIS — T25222D Burn of second degree of left foot, subsequent encounter: Secondary | ICD-10-CM | POA: Diagnosis not present

## 2014-04-30 DIAGNOSIS — R0602 Shortness of breath: Secondary | ICD-10-CM | POA: Diagnosis not present

## 2014-04-30 DIAGNOSIS — S90922D Unspecified superficial injury of left foot, subsequent encounter: Secondary | ICD-10-CM | POA: Diagnosis not present

## 2014-05-03 ENCOUNTER — Ambulatory Visit (INDEPENDENT_AMBULATORY_CARE_PROVIDER_SITE_OTHER): Payer: Medicare Other | Admitting: Podiatry

## 2014-05-03 VITALS — BP 160/85 | HR 79 | Resp 16

## 2014-05-03 DIAGNOSIS — M722 Plantar fascial fibromatosis: Secondary | ICD-10-CM

## 2014-05-03 DIAGNOSIS — M79671 Pain in right foot: Secondary | ICD-10-CM
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
## 2014-05-10 ENCOUNTER — Emergency Department: Payer: Self-pay | Admitting: Emergency Medicine

## 2014-05-10 DIAGNOSIS — T148 Other injury of unspecified body region: Secondary | ICD-10-CM | POA: Diagnosis not present

## 2014-05-10 DIAGNOSIS — M542 Cervicalgia: Secondary | ICD-10-CM | POA: Diagnosis not present

## 2014-08-13 DIAGNOSIS — K130 Diseases of lips: Secondary | ICD-10-CM | POA: Diagnosis not present

## 2014-08-20 ENCOUNTER — Ambulatory Visit: Payer: Self-pay | Admitting: Family Medicine

## 2014-08-20 DIAGNOSIS — R0602 Shortness of breath: Secondary | ICD-10-CM | POA: Diagnosis not present

## 2014-08-20 DIAGNOSIS — R609 Edema, unspecified: Secondary | ICD-10-CM | POA: Diagnosis not present

## 2014-08-20 DIAGNOSIS — S90922D Unspecified superficial injury of left foot, subsequent encounter: Secondary | ICD-10-CM | POA: Diagnosis not present

## 2014-08-20 DIAGNOSIS — M7989 Other specified soft tissue disorders: Secondary | ICD-10-CM | POA: Diagnosis not present

## 2014-08-20 DIAGNOSIS — M79604 Pain in right leg: Secondary | ICD-10-CM | POA: Diagnosis not present

## 2014-08-23 ENCOUNTER — Other Ambulatory Visit: Payer: Self-pay | Admitting: Family Medicine

## 2014-08-23 DIAGNOSIS — Z1231 Encounter for screening mammogram for malignant neoplasm of breast: Secondary | ICD-10-CM

## 2014-08-31 ENCOUNTER — Ambulatory Visit (HOSPITAL_COMMUNITY): Payer: Medicare Other | Attending: Family Medicine

## 2014-09-13 DIAGNOSIS — Z1231 Encounter for screening mammogram for malignant neoplasm of breast: Secondary | ICD-10-CM | POA: Diagnosis not present

## 2014-09-13 LAB — HM MAMMOGRAPHY

## 2014-09-24 DIAGNOSIS — T7840XA Allergy, unspecified, initial encounter: Secondary | ICD-10-CM | POA: Diagnosis not present

## 2014-09-24 DIAGNOSIS — S90922D Unspecified superficial injury of left foot, subsequent encounter: Secondary | ICD-10-CM | POA: Diagnosis not present

## 2014-09-24 DIAGNOSIS — R0602 Shortness of breath: Secondary | ICD-10-CM | POA: Diagnosis not present

## 2014-09-27 DIAGNOSIS — E559 Vitamin D deficiency, unspecified: Secondary | ICD-10-CM | POA: Insufficient documentation

## 2014-09-27 DIAGNOSIS — E0590 Thyrotoxicosis, unspecified without thyrotoxic crisis or storm: Secondary | ICD-10-CM | POA: Insufficient documentation

## 2014-09-27 DIAGNOSIS — D72819 Decreased white blood cell count, unspecified: Secondary | ICD-10-CM | POA: Insufficient documentation

## 2014-09-27 DIAGNOSIS — R609 Edema, unspecified: Secondary | ICD-10-CM | POA: Insufficient documentation

## 2014-09-27 DIAGNOSIS — M797 Fibromyalgia: Secondary | ICD-10-CM | POA: Insufficient documentation

## 2014-09-27 DIAGNOSIS — F329 Major depressive disorder, single episode, unspecified: Secondary | ICD-10-CM | POA: Insufficient documentation

## 2014-09-27 DIAGNOSIS — R682 Dry mouth, unspecified: Secondary | ICD-10-CM | POA: Insufficient documentation

## 2014-09-27 DIAGNOSIS — F32A Depression, unspecified: Secondary | ICD-10-CM | POA: Insufficient documentation

## 2014-09-27 DIAGNOSIS — R51 Headache: Secondary | ICD-10-CM

## 2014-09-27 DIAGNOSIS — M545 Low back pain, unspecified: Secondary | ICD-10-CM | POA: Insufficient documentation

## 2014-09-27 DIAGNOSIS — IMO0002 Reserved for concepts with insufficient information to code with codable children: Secondary | ICD-10-CM | POA: Insufficient documentation

## 2014-09-27 DIAGNOSIS — Z13820 Encounter for screening for osteoporosis: Secondary | ICD-10-CM | POA: Insufficient documentation

## 2014-09-27 DIAGNOSIS — R519 Headache, unspecified: Secondary | ICD-10-CM | POA: Insufficient documentation

## 2014-10-24 DIAGNOSIS — L814 Other melanin hyperpigmentation: Secondary | ICD-10-CM | POA: Diagnosis not present

## 2014-10-24 DIAGNOSIS — L578 Other skin changes due to chronic exposure to nonionizing radiation: Secondary | ICD-10-CM | POA: Diagnosis not present

## 2014-10-24 DIAGNOSIS — Z1283 Encounter for screening for malignant neoplasm of skin: Secondary | ICD-10-CM | POA: Diagnosis not present

## 2014-10-24 DIAGNOSIS — D1800 Hemangioma unspecified site: Secondary | ICD-10-CM | POA: Diagnosis not present

## 2014-10-24 DIAGNOSIS — L820 Inflamed seborrheic keratosis: Secondary | ICD-10-CM | POA: Diagnosis not present

## 2014-10-24 DIAGNOSIS — Z85828 Personal history of other malignant neoplasm of skin: Secondary | ICD-10-CM | POA: Diagnosis not present

## 2014-10-24 DIAGNOSIS — D229 Melanocytic nevi, unspecified: Secondary | ICD-10-CM | POA: Diagnosis not present

## 2014-10-24 DIAGNOSIS — L821 Other seborrheic keratosis: Secondary | ICD-10-CM | POA: Diagnosis not present

## 2014-11-26 ENCOUNTER — Other Ambulatory Visit: Payer: Self-pay | Admitting: Family Medicine

## 2014-11-30 ENCOUNTER — Telehealth: Payer: Self-pay | Admitting: Family Medicine

## 2014-11-30 ENCOUNTER — Encounter: Payer: Self-pay | Admitting: Family Medicine

## 2014-11-30 ENCOUNTER — Ambulatory Visit (INDEPENDENT_AMBULATORY_CARE_PROVIDER_SITE_OTHER): Payer: Medicare Other | Admitting: Family Medicine

## 2014-11-30 VITALS — BP 150/82 | HR 80 | Temp 97.7°F | Resp 16 | Ht 61.5 in | Wt 184.0 lb

## 2014-11-30 DIAGNOSIS — E039 Hypothyroidism, unspecified: Secondary | ICD-10-CM

## 2014-11-30 DIAGNOSIS — E7800 Pure hypercholesterolemia, unspecified: Secondary | ICD-10-CM

## 2014-11-30 DIAGNOSIS — E668 Other obesity: Secondary | ICD-10-CM

## 2014-11-30 DIAGNOSIS — IMO0002 Reserved for concepts with insufficient information to code with codable children: Secondary | ICD-10-CM

## 2014-11-30 DIAGNOSIS — E559 Vitamin D deficiency, unspecified: Secondary | ICD-10-CM

## 2014-11-30 DIAGNOSIS — I10 Essential (primary) hypertension: Secondary | ICD-10-CM

## 2014-11-30 DIAGNOSIS — Z0000 Encounter for general adult medical examination without abnormal findings: Secondary | ICD-10-CM

## 2014-11-30 DIAGNOSIS — M1990 Unspecified osteoarthritis, unspecified site: Secondary | ICD-10-CM

## 2014-11-30 DIAGNOSIS — F32A Depression, unspecified: Secondary | ICD-10-CM

## 2014-11-30 DIAGNOSIS — F329 Major depressive disorder, single episode, unspecified: Secondary | ICD-10-CM

## 2014-11-30 MED ORDER — PREDNISONE 10 MG PO TABS: ORAL_TABLET | ORAL | Status: DC

## 2014-11-30 NOTE — Telephone Encounter (Signed)
Will address pain at ov. Thanks.

## 2014-11-30 NOTE — Progress Notes (Signed)
Patient ID: ANNLEIGH KNUEPPEL, female   DOB: [birth year], 74 y.o.   MRN: [id number]       Patient: AMI MALLY, Female    DOB: 09-26-1940, 74 y.o.   MRN: [id number] Visit Date: 11/30/2014  Today's Provider: Margarita Rana, MD   Chief Complaint  Patient presents with  . Medicare Wellness   Subjective:    Annual wellness visit SARAGRACE SELKE is a 74 y.o. female who presents today for her Subsequent Annual Wellness Visit. She feels fairly well, c/o of body aches all over. She reports not exercising other than a few streches. She reports she is sleeping poorly. 11/22/13 Last CPE 11/22/13 Pap-neg  09/13/14 BI-RADS 1 02/03/11 Colonoscopy-diverticulosis 12/21/13 BMD-Osteopenia 03/26/11 EKG  Patient also with significant joint and muscle pain. Is really affecting ADLs and quality of life.  Can not do a lot of things she would like to. Is quite severe. Also can not sleep for same reason. Also, affecting her mood. Making her depression worse. Has very positive ROS.   Lab Results  Component Value Date   WBC 4.8 11/22/2013   HGB 14.3 11/22/2013   HCT 42 11/22/2013   PLT 197 11/22/2013   CHOL 230* 11/22/2013   TRIG 215* 11/22/2013   HDL 44 11/22/2013   LDLCALC 143 11/22/2013   ALT 13 11/22/2013   AST 15 11/22/2013   NA 138 11/22/2013   K 4.5 11/22/2013   CREATININE 0.8 11/22/2013   BUN 18 11/22/2013   HGBA1C 5.9 11/22/2013    -----------------------------------------------------------   Review of Systems  Constitutional: Positive for activity change, appetite change and fatigue.  HENT: Positive for mouth sores.   Eyes: Positive for photophobia.  Respiratory: Negative.   Cardiovascular: Positive for leg swelling.  Gastrointestinal: Positive for abdominal pain and abdominal distention.  Endocrine: Positive for heat intolerance, polydipsia, polyphagia and polyuria.  Genitourinary: Negative.   Musculoskeletal: Positive for myalgias, back pain, joint swelling, arthralgias,  neck pain and neck stiffness.  Allergic/Immunologic: Positive for food allergies.  Neurological: Positive for weakness and headaches.  Hematological: Bruises/bleeds easily.  Psychiatric/Behavioral: Positive for sleep disturbance.    History   Social History  . Marital Status: Married    Spouse Name: [name]  . Number of Children: 3  . Years of Education: N/A   Occupational History  . Not on file.   Social History Main Topics  . Smoking status: Former Research scientist (life sciences)  . Smokeless tobacco: Never Used     Comment: quit 45  years ago   . Alcohol Use: No  . Drug Use: No  . Sexual Activity: Not on file   Other Topics Concern  . Not on file   Social History Narrative    Patient Active Problem List   Diagnosis Date Noted  . Adult BMI 30+ 09/27/2014  . Clinical depression 09/27/2014  . Dry mouth 09/27/2014  . Accumulation of fluid in tissues 09/27/2014  . Fibrositis 09/27/2014  . Cephalalgia 09/27/2014  . LBP (low back pain) 09/27/2014  . Screening for osteoporosis 09/27/2014  . Hyperthyroidism, subclinical 09/27/2014  . Avitaminosis D 09/27/2014  . Decreased leukocytes 09/27/2014  . History of colon polyps 11/14/2009  . Abnormal blood sugar 05/03/2009  . Cannot sleep 09/29/2007  . BP (high blood pressure) 03/09/2007  . Adaptation reaction 01/29/2007  . Acid reflux 10/07/2006  . Menopausal and postmenopausal disorder 10/05/2006  . Headache, migraine 10/05/2006  . Arthritis, degenerative 03/23/1994  . Adult hypothyroidism 04/15/1993  . Hypercholesteremia 03/25/1993    Past  Surgical History  Procedure Laterality Date  . Abdominal hysterectomy    . Nasal sinus surgery    . Right oophorectomy    . Appendectomy    . Bladder surgery      Her family history includes Alzheimer's disease in her mother; Cancer in her sister; Heart attack in her father; Heart attack (age of onset: 60) in her brother; Heart attack (age of onset: 54) in her brother; Kidney cancer in her mother;  Kidney disease in her sister; Ovarian cancer in her sister; Pancreatic cancer in her sister.    Previous Medications   ALBUTEROL (PROVENTIL HFA;VENTOLIN HFA) 108 (90 BASE) MCG/ACT INHALER    Inhale into the lungs.   ASPIRIN 81 MG TABLET    ASPIRIN, 81MG  (Oral Tablet Delayed Release)  1 Every Day for 0 days  Quantity: 0.00;  Refills: 0   Ordered :17-November-2010  [name] ;  Started 05-Oct-2006 Active Comments: DX: 782.9   BYSTOLIC 10 MG TABLET    TAKE 1 TABLET BY MOUTH EVERY DAY   DIAZEPAM (VALIUM) 10 MG TABLET       EPIPEN 2-PAK 0.3 MG/0.3ML SOAJ INJECTION    INJECT AS DIRECTED FOR SEVERE ALLERGIC REACTION   HYDROCHLOROTHIAZIDE (HYDRODIURIL) 12.5 MG TABLET    TAKE 1 TABLET BY MOUTH EVERY DAY FOR SWELLING IN FEET AND ANKLES   LEVOTHYROXINE (SYNTHROID, LEVOTHROID) 88 MCG TABLET       NIACIN PO    Take by mouth.   OMEPRAZOLE-SODIUM BICARBONATE PO    Take by mouth.   RED YEAST RICE 600 MG CAPS    Take by mouth.   TACROLIMUS (PROTOPIC) 0.1 % OINTMENT    TACROLIMUS, 0.1% (External Ointment) - Historical Medication  two times daily (0.1 %) Active   ZOLPIDEM (AMBIEN) 10 MG TABLET        Patient Care Team: Margarita Rana, MD as PCP - General (Family Medicine)     Objective:   Vitals: BP 150/82 mmHg  Pulse 80  Temp(Src) 97.7 F (36.5 C) (Oral)  Resp 16  Ht 5' 1.5" (1.562 m)  Wt 184 lb (83.462 kg)  BMI 34.21 kg/m2  Physical Exam  Constitutional: She is oriented to person, place, and time. She appears well-developed and well-nourished.  HENT:  Head: Normocephalic and atraumatic.  Right Ear: Tympanic membrane, external ear and ear canal normal.  Left Ear: Tympanic membrane, external ear and ear canal normal.  Nose: Nose normal.  Mouth/Throat: Uvula is midline, oropharynx is clear and moist and mucous membranes are normal.  Eyes: Conjunctivae, EOM and lids are normal. Pupils are equal, round, and reactive to light.  Neck: Trachea normal and normal range of motion. Neck supple. Carotid  bruit is not present. No thyroid mass and no thyromegaly present.  Cardiovascular: Normal rate, regular rhythm and normal heart sounds.   Pulmonary/Chest: Effort normal and breath sounds normal.  Abdominal: Soft. Normal appearance and bowel sounds are normal. There is no hepatosplenomegaly. There is no tenderness.  Musculoskeletal: Normal range of motion.  Lymphadenopathy:    She has no cervical adenopathy.    She has no axillary adenopathy.  Neurological: She is alert and oriented to person, place, and time. She has normal strength. No cranial nerve deficit.  Skin: Skin is warm, dry and intact.  Psychiatric: She has a normal mood and affect. Her speech is normal and behavior is normal. Judgment and thought content normal. Cognition and memory are normal.       Activities of Daily Living In  your present state of health, do you have any difficulty performing the following activities: 11/30/2014  Hearing? N  Vision? N  Difficulty concentrating or making decisions? N  Walking or climbing stairs? Y  Dressing or bathing? N  Doing errands, shopping? N    Fall Risk Assessment Fall Risk  11/30/2014  Falls in the past year? Yes  Number falls in past yr: 2 or more  Injury with Fall? Yes     Depression Screen PHQ 2/9 Scores 11/30/2014  PHQ - 2 Score 2  PHQ- 9 Score 12    Cognitive Testing - 6-CIT  Correct? Score   What year is it? yes 0 0 or 4  What month is it? yes 0 0 or 3  Memorize:    Pia Mau,  [birth year],  [address]  [place],      What time is it? (within 1 hour) yes 0 0 or 3  Count backwards from 20 yes 0 0, 2, or 4  Name the months of the year yes 0 0, 2, or 4  Repeat name & address above yes 2 0, 2, 4, 6, 8, or 10       TOTAL SCORE  2/28   Interpretation:  Normal  Normal (0-7) Abnormal (8-28)      Assessment & Plan:     Annual Wellness Visit  Reviewed patient's Family Medical History Reviewed and updated list of patient's medical providers Assessment of cognitive  impairment was done Assessed patient's functional ability Established a written schedule for health screening [place] Completed and Reviewed  Exercise Activities and Dietary recommendations Goals    . Exercise 150 minutes per week (moderate activity)       Immunization History  Administered Date(s) Administered  . Pneumococcal Conjugate-13 11/22/2013  . Pneumococcal Polysaccharide-23 03/30/2005, 11/19/2011  . Tdap 10/01/2005  . Zoster 11/19/2011    Health Maintenance  Topic Date Due  . INFLUENZA VACCINE  12/24/2014  . TETANUS/TDAP  10/02/2015  . MAMMOGRAM  09/12/2016  . COLONOSCOPY  02/02/2021  . DEXA SCAN  Completed  . ZOSTAVAX  Completed  . PNA vac Low Risk Adult  Completed    1. Medicare annual wellness visit, subsequent Healthy maintenance up to date.  Screening and education done. Does have depressed mood. See below. Will address exercise when pain improved.   2. Essential hypertension Continue med - CBC with Differential/Platelet  3. Hypothyroidism, unspecified hypothyroidism type  Check labs.  - TSH  4. Clinical depression Not well controlled currently. Will treat pain and reassess in one month.  May consider Effexor if not improved.    5. Hypercholesteremia Can not take statins. Taking supplements. Will check labs.  - Comprehensive metabolic panel - Lipid Panel With LDL/HDL Ratio  6. Avitaminosis D Taking supplements. Will check labs.  - Vit D  25 hydroxy (rtn osteoporosis monitoring)  7. Adult BMI 30+ Difficult to get off secondary to pain. Will assess at follow up.   8. Arthritis Worsening. Will try prednisone taper. Will check labs for other etiology.  - predniSONE (DELTASONE) 10 MG tablet; 6 po for 2 days and then 5 po for 2 days and then 4 po for 2 days and 3 po for 2 days and then 2 po for 2 days and then 1 po for 2 days.  Dispense: 42 tablet; Refill: 0 - Sedimentation rate - CYCLIC CITRUL PEPTIDE ANTIBODY, IGG/IGA -  ANA Comprehensive Panel - Rheumatoid factor  Patient was seen and examined by  Jerrell Belfast, MD, and note scribed by Lynford Humphrey, [place].  I have reviewed the document for accuracy and completeness and I agree with above. Jerrell Belfast, MD   Margarita Rana, MD         ------------------------------------------------------------------------------------------------------------

## 2014-12-01 LAB — COMPREHENSIVE METABOLIC PANEL
ALK PHOS: 76 IU/L (ref 39–117)
ALT: 13 IU/L (ref 0–32)
AST: 15 IU/L (ref 0–40)
Albumin/Globulin Ratio: 1.9 (ref 1.1–2.5)
Albumin: 4.2 g/dL (ref 3.5–4.8)
BILIRUBIN TOTAL: 0.4 mg/dL (ref 0.0–1.2)
BUN / CREAT RATIO: 20 (ref 11–26)
BUN: 17 mg/dL (ref 8–27)
CHLORIDE: 95 mmol/L — AB (ref 97–108)
CO2: 27 mmol/L (ref 18–29)
Calcium: 9.5 mg/dL (ref 8.7–10.3)
Creatinine, Ser: 0.85 mg/dL (ref 0.57–1.00)
GFR calc non Af Amer: 69 mL/min/{1.73_m2} (ref >59)
GFR, EST AFRICAN AMERICAN: 79 mL/min/{1.73_m2} (ref >59)
GLUCOSE: 116 mg/dL — AB (ref 65–99)
Globulin, Total: 2.2 g/dL (ref 1.5–4.5)
POTASSIUM: 4.5 mmol/L (ref 3.5–5.2)
SODIUM: 135 mmol/L (ref 134–144)
Total Protein: 6.4 g/dL (ref 6.0–8.5)

## 2014-12-01 LAB — ANA COMPREHENSIVE PANEL
Anti JO-1: <0.2 AI (ref 0.0–0.9)
Centromere Ab Screen: 0.3 AI (ref 0.0–0.9)
Chromatin Ab SerPl-aCnc: <0.2 AI (ref 0.0–0.9)
ENA RNP Ab: <0.2 AI (ref 0.0–0.9)
ENA SM Ab Ser-aCnc: <0.2 AI (ref 0.0–0.9)
ENA SSA (RO) Ab: <0.2 AI (ref 0.0–0.9)
ENA SSB (LA) Ab: <0.2 AI (ref 0.0–0.9)

## 2014-12-01 LAB — LIPID PANEL WITH LDL/HDL RATIO
Cholesterol, Total: 236 mg/dL — ABNORMAL HIGH (ref 100–199)
HDL: 51 mg/dL (ref >39)
LDL Calculated: 154 mg/dL — ABNORMAL HIGH (ref 0–99)
LDl/HDL Ratio: 3 ratio units (ref 0.0–3.2)
Triglycerides: 157 mg/dL — ABNORMAL HIGH (ref 0–149)
VLDL Cholesterol Cal: 31 mg/dL (ref 5–40)

## 2014-12-01 LAB — CBC WITH DIFFERENTIAL/PLATELET
Basophils Absolute: 0 10*3/uL (ref 0.0–0.2)
Basos: 1 %
EOS (ABSOLUTE): 0.1 10*3/uL (ref 0.0–0.4)
EOS: 3 %
Hematocrit: 42.4 % (ref 34.0–46.6)
Hemoglobin: 14 g/dL (ref 11.1–15.9)
Immature Grans (Abs): 0 10*3/uL (ref 0.0–0.1)
Immature Granulocytes: 0 %
LYMPHS ABS: 1.3 10*3/uL (ref 0.7–3.1)
Lymphs: 32 %
MCH: 28.8 pg (ref 26.6–33.0)
MCHC: 33 g/dL (ref 31.5–35.7)
MCV: 87 fL (ref 79–97)
MONOS ABS: 0.3 10*3/uL (ref 0.1–0.9)
Monocytes: 8 %
Neutrophils Absolute: 2.2 10*3/uL (ref 1.4–7.0)
Neutrophils: 56 %
PLATELETS: 176 10*3/uL (ref 150–379)
RBC: 4.86 x10E6/uL (ref 3.77–5.28)
RDW: 13.4 % (ref 12.3–15.4)
WBC: 4 10*3/uL (ref 3.4–10.8)

## 2014-12-01 LAB — SEDIMENTATION RATE: Sed Rate: 3 mm/hr (ref 0–40)

## 2014-12-01 LAB — VITAMIN D 25 HYDROXY (VIT D DEFICIENCY, FRACTURES): Vit D, 25-Hydroxy: 19.8 ng/mL — ABNORMAL LOW (ref 30.0–100.0)

## 2014-12-01 LAB — TSH: TSH: 1.15 u[IU]/mL (ref 0.450–4.500)

## 2014-12-01 LAB — RHEUMATOID FACTOR: Rhuematoid fact SerPl-aCnc: 10 IU/mL (ref 0.0–13.9)

## 2014-12-03 ENCOUNTER — Telehealth: Payer: Self-pay

## 2014-12-03 LAB — CYCLIC CITRUL PEPTIDE ANTIBODY, IGG/IGA: Cyclic Citrullin Peptide Ab: 3 units (ref 0–19)

## 2014-12-03 NOTE — Telephone Encounter (Signed)
-----   Message from Margarita Rana, MD sent at 12/03/2014  9:25 AM EDT ----- Labs stable except for low vit D. No signs of inflammatory arthritis. Cholesterol elevated some, but can not tolerated statins, so continue current medications.   Add  vit D 2000 iu otc daily. Thanks.

## 2014-12-03 NOTE — Telephone Encounter (Signed)
Pt advised as directed below.   Thanks,   -[place]ont Gavitt  

## 2014-12-10 ENCOUNTER — Other Ambulatory Visit: Payer: Self-pay | Admitting: Family Medicine

## 2014-12-10 DIAGNOSIS — F32A Depression, unspecified: Secondary | ICD-10-CM

## 2014-12-10 DIAGNOSIS — F329 Major depressive disorder, single episode, unspecified: Secondary | ICD-10-CM

## 2014-12-10 NOTE — Telephone Encounter (Signed)
Please call in rx.  Thanks.  

## 2014-12-28 ENCOUNTER — Ambulatory Visit (INDEPENDENT_AMBULATORY_CARE_PROVIDER_SITE_OTHER): Payer: Medicare Other | Admitting: Family Medicine

## 2014-12-28 ENCOUNTER — Encounter: Payer: Self-pay | Admitting: Family Medicine

## 2014-12-28 VITALS — BP 154/84 | HR 60 | Temp 98.5°F | Resp 16 | Wt 180.0 lb

## 2014-12-28 DIAGNOSIS — G4700 Insomnia, unspecified: Secondary | ICD-10-CM | POA: Diagnosis not present

## 2014-12-28 DIAGNOSIS — M158 Other polyosteoarthritis: Secondary | ICD-10-CM | POA: Diagnosis not present

## 2014-12-28 MED ORDER — CELECOXIB 200 MG PO CAPS: 200.0000 mg | ORAL_CAPSULE | Freq: Two times a day (BID) | ORAL | Status: DC

## 2014-12-28 MED ORDER — ZOLPIDEM TARTRATE 10 MG PO TABS: 10.0000 mg | ORAL_TABLET | Freq: Every day | ORAL | Status: DC

## 2014-12-28 MED ORDER — GABAPENTIN 300 MG PO CAPS: 300.0000 mg | ORAL_CAPSULE | Freq: Three times a day (TID) | ORAL | Status: DC

## 2014-12-28 NOTE — Progress Notes (Signed)
Patient ID: Stacey [name], female   DOB: [birth year], 74 y.o.   MRN: [id number]         Patient: Stacey [name] Female    DOB: [birth year]   74 y.o.   MRN: [id number] Visit Date: 12/28/2014  Today's Provider: Margarita Rana, MD   Chief Complaint  Patient presents with  . Arthritis   Subjective:    Arthritis Presents for follow-up visit. The disease course has been improving. She complains of pain and joint swelling. Associated symptoms include fatigue (Gets tired easily but has improved). Pertinent negatives include no diarrhea or fever. Her past medical history is significant for chronic back pain and osteoarthritis.       Allergies  Allergen Reactions  . Cephalexin Anaphylaxis  . Atorvastatin   . Statins Other (See Comments)    Muscle pain  . Penicillins Hives and Rash   Previous Medications   ALBUTEROL (PROVENTIL HFA;VENTOLIN HFA) 108 (90 BASE) MCG/ACT INHALER    Inhale into the lungs.   ASPIRIN 81 MG TABLET    ASPIRIN, 81MG  (Oral Tablet Delayed Release)  1 Every Day for 0 days  Quantity: 0.00;  Refills: 0   Ordered :17-November-2010  Stacey [name] ;  Started 05-Oct-2006 Active Comments: DX: 268.3   BYSTOLIC 10 MG TABLET    TAKE 1 TABLET BY MOUTH EVERY DAY   DIAZEPAM (VALIUM) 10 MG TABLET    TAKE 1 TABLET BY MOUTH EVERY DAY   EPIPEN 2-PAK 0.3 MG/0.3ML SOAJ INJECTION    INJECT AS DIRECTED FOR SEVERE ALLERGIC REACTION   HYDROCHLOROTHIAZIDE (HYDRODIURIL) 12.5 MG TABLET    TAKE 1 TABLET BY MOUTH EVERY DAY FOR SWELLING IN FEET AND ANKLES   LEVOTHYROXINE (SYNTHROID, LEVOTHROID) 88 MCG TABLET       NIACIN PO    Take by mouth.   OMEPRAZOLE-SODIUM BICARBONATE PO    Take by mouth.   PREDNISONE (DELTASONE) 10 MG TABLET    6 po for 2 days and then 5 po for 2 days and then 4 po for 2 days and 3 po for 2 days and then 2 po for 2 days and then 1 po for 2 days.   RED YEAST RICE 600 MG CAPS    Take by mouth.   TACROLIMUS (PROTOPIC) 0.1 % OINTMENT    TACROLIMUS, 0.1% (External Ointment) -  Historical Medication  two times daily (0.1 %) Active   ZOLPIDEM (AMBIEN) 10 MG TABLET        Review of Systems  Constitutional: Positive for fatigue (Gets tired easily but has improved). Negative for fever, chills, diaphoresis, activity change, appetite change and unexpected weight change.  Respiratory: Negative for apnea, cough, choking, chest tightness, shortness of breath, wheezing and stridor.   Cardiovascular: Positive for leg swelling (Has improved. ). Negative for chest pain and palpitations.  Gastrointestinal: Negative for nausea, vomiting, abdominal pain, diarrhea, constipation, blood in stool, abdominal distention, anal bleeding and rectal pain.       Heartburn seems to be worsening especially since starting Vitamin D.   Musculoskeletal: Positive for myalgias, back pain, joint swelling, arthralgias, arthritis, neck pain and neck stiffness. Negative for gait problem.       Pt reports her pain has improved since taking prednisone.  Psychiatric/Behavioral: Positive for sleep disturbance. Negative for suicidal ideas, hallucinations, behavioral problems, confusion, self-injury, dysphoric mood, decreased concentration and agitation. The patient is nervous/anxious. The patient is not hyperactive.     History  Substance Use Topics  . Smoking status: Former Research scientist (life sciences)  .  Smokeless tobacco: Never Used     Comment: quit 45  years ago   . Alcohol Use: No   Objective:   BP 154/84 mmHg  Pulse 60  Temp(Src) 98.5 F (36.9 C) (Oral)  Resp 16  Wt 180 lb (81.647 kg)  Physical Exam  Constitutional: She is oriented to person, place, and time. She appears well-developed and well-nourished.  Musculoskeletal:  Joints with no erythema today.   Neurological: She is alert and oriented to person, place, and time.      Assessment & Plan:     1. Cannot sleep Will refill medication and try to increase pain medication.  - zolpidem (AMBIEN) 10 MG tablet; Take 1 tablet (10 mg total) by mouth at  bedtime.  Dispense: 30 tablet; Refill: 5 - celecoxib (CELEBREX) 200 MG capsule; Take 1 capsule (200 mg total) by mouth 2 (two) times daily.  Dispense: 60 capsule; Refill: 5  2. Other osteoarthritis involving multiple joints Improved with Prednisone.  Fibromyalgia is probable diagnosis.  Will start Celebrex as needed and retry Gabapentin at higher dose. Will recheck in 6 weeks. Did have negative labs for inflammatory arthritis last month. Please call back if condition worsens or does not continue to improve.    - zolpidem (AMBIEN) 10 MG tablet; Take 1 tablet (10 mg total) by mouth at bedtime.  Dispense: 30 tablet; Refill: 5 - celecoxib (CELEBREX) 200 MG capsule; Take 1 capsule (200 mg total) by mouth 2 (two) times daily.  Dispense: 60 capsule; Refill: [place], MD  [facility]

## 2015-01-24 ENCOUNTER — Telehealth: Payer: Self-pay

## 2015-01-24 NOTE — Telephone Encounter (Signed)
(  Regarding fax sent by insurance about Ambien.) Pt has tried Trazodone and Remeron in the past for insomnia, and states, "I can't take those". Pt does not recall taking recommended medications Temazepam, Silenor, or Rozerem for sleep. (These are the medications that are recommended by [facility].) No record in EMR for these meds. Renaldo Fiddler, CMA

## 2015-01-25 MED ORDER — TEMAZEPAM 15 MG PO CAPS: 15.0000 mg | ORAL_CAPSULE | Freq: Every evening | ORAL | Status: DC | PRN

## 2015-01-25 NOTE — Telephone Encounter (Signed)
RX called in.   Thanks,   -Teneka Malmberg  

## 2015-01-25 NOTE — Telephone Encounter (Signed)
Please call in prescription  Thanks

## 2015-02-06 ENCOUNTER — Encounter: Payer: Self-pay | Admitting: Family Medicine

## 2015-02-06 ENCOUNTER — Ambulatory Visit (INDEPENDENT_AMBULATORY_CARE_PROVIDER_SITE_OTHER): Payer: Medicare Other | Admitting: Family Medicine

## 2015-02-06 VITALS — BP 146/76 | HR 72 | Temp 98.2°F | Resp 16 | Ht 61.5 in | Wt 184.0 lb

## 2015-02-06 DIAGNOSIS — I10 Essential (primary) hypertension: Secondary | ICD-10-CM

## 2015-02-06 DIAGNOSIS — M158 Other polyosteoarthritis: Secondary | ICD-10-CM | POA: Diagnosis not present

## 2015-02-06 MED ORDER — LISINOPRIL 5 MG PO TABS: 5.0000 mg | ORAL_TABLET | Freq: Every day | ORAL | Status: DC

## 2015-02-06 NOTE — Progress Notes (Signed)
Patient ID: Stacey [name], female   DOB: 1940-06-05, 74 y.o.   MRN: [id number]        Patient: Stacey [name] Female    DOB: 1940-06-22   74 y.o.   MRN: [id number] Visit Date: 02/06/2015  Today's Provider: Margarita Rana, MD   Chief Complaint  Patient presents with  . Osteoarthritis   Subjective:    HPI  Follow up for osteoarthritis in multiple joints  The patient was last seen for this 1 months ago. Changes made at last visit include starting Celebrex and increasing Gabapentin.  She reports excellent compliance with treatment. She feels that condition is Improved. Really thinks she is better.  She is having side effects. Swelling in her hands and feet. She is happy with how she feels currently. Still having pain. Can walk.  Having a hard time currently. Having to move her brother to long term care. He has CP and now dementia. Unable to stay at [facility].     Does have hypertension. Has ben monitoring. It has been running up.  Has not had a med change in a while. Takes her medication without any difficulty.  Is having some edema feet. Not painful.  Does not have any side effects to the medication.  ------------------------------------------------------------------------------------ .      Allergies  Allergen Reactions  . Cephalexin Anaphylaxis  . Atorvastatin   . Statins Other (See Comments)    Muscle pain  . Penicillins Hives and Rash   Previous Medications   ALBUTEROL (PROVENTIL HFA;VENTOLIN HFA) 108 (90 BASE) MCG/ACT INHALER    Inhale into the lungs.   ASPIRIN 81 MG TABLET    ASPIRIN, 81MG  (Oral Tablet Delayed Release)  1 Every Day for 0 days  Quantity: 0.00;  Refills: 0   Ordered :17-November-2010  [name] ;  Started 05-Oct-2006 Active Comments: DX: 510.2   BYSTOLIC 10 MG TABLET    TAKE 1 TABLET BY MOUTH EVERY DAY   CELECOXIB (CELEBREX) 200 MG CAPSULE    Take 1 capsule (200 mg total) by mouth 2 (two) times daily.   DIAZEPAM (VALIUM) 10 MG TABLET    TAKE  1 TABLET BY MOUTH EVERY DAY   EPIPEN 2-PAK 0.3 MG/0.3ML SOAJ INJECTION    INJECT AS DIRECTED FOR SEVERE ALLERGIC REACTION   GABAPENTIN (NEURONTIN) 300 MG CAPSULE    Take 1 capsule (300 mg total) by mouth 3 (three) times daily.   HYDROCHLOROTHIAZIDE (HYDRODIURIL) 12.5 MG TABLET    TAKE 1 TABLET BY MOUTH EVERY DAY FOR SWELLING IN FEET AND ANKLES   LEVOTHYROXINE (SYNTHROID, LEVOTHROID) 88 MCG TABLET       NIACIN PO    Take by mouth.   OMEPRAZOLE-SODIUM BICARBONATE PO    Take by mouth.   RED YEAST RICE 600 MG CAPS    Take by mouth.   TACROLIMUS (PROTOPIC) 0.1 % OINTMENT    TACROLIMUS, 0.1% (External Ointment) - Historical Medication  two times daily (0.1 %) Active   TEMAZEPAM (RESTORIL) 15 MG CAPSULE    Take 1 capsule (15 mg total) by mouth at bedtime as needed for sleep.   ZOLPIDEM (AMBIEN) 10 MG TABLET    Take 1 tablet (10 mg total) by mouth at bedtime.    Review of Systems  Constitutional: Positive for fatigue.  Respiratory: Negative.   Cardiovascular: Positive for leg swelling.  Endocrine: Negative.   Musculoskeletal: Positive for myalgias and arthralgias.  Psychiatric/Behavioral: Positive for sleep disturbance.    Social History  Substance Use Topics  .  Smoking status: Former Research scientist (life sciences)  . Smokeless tobacco: Never Used     Comment: quit 45  years ago   . Alcohol Use: No   Objective:   BP 146/76 mmHg  Pulse 72  Temp(Src) 98.2 F (36.8 C) (Oral)  Resp 16  Ht 5' 1.5" (1.562 m)  Wt 184 lb (83.462 kg)  BMI 34.21 kg/m2  SpO2 97%  Physical Exam  Constitutional: She is oriented to person, place, and time. She appears well-developed and well-nourished.  Cardiovascular: Normal rate and regular rhythm.   Pulmonary/Chest: Effort normal and breath sounds normal.  Neurological: She is alert and oriented to person, place, and time.      Assessment & Plan:     1. Other osteoarthritis involving multiple joints Improved with med changes.   2. Essential hypertension Worsening. Kidney  function is stable.  Will recheck labs in one week and follow up office visit.  - lisinopril (PRINIVIL,ZESTRIL) 5 MG tablet; Take 1 tablet (5 mg total) by mouth daily.  Dispense: 90 tablet; Refill: 3     Patient was seen and examined by Jerrell Belfast, MD, and note scribed, in part,  by Lynford Humphrey, CMA and reviewed with patient by me.  I have reviewed the document for accuracy and completeness and I agree with above. - Jerrell Belfast, MD     Margarita Rana, MD [facility]

## 2015-02-26 DIAGNOSIS — Z23 Encounter for immunization: Secondary | ICD-10-CM | POA: Diagnosis not present

## 2015-03-06 ENCOUNTER — Ambulatory Visit (INDEPENDENT_AMBULATORY_CARE_PROVIDER_SITE_OTHER): Payer: Medicare Other | Admitting: Family Medicine

## 2015-03-06 ENCOUNTER — Encounter: Payer: Self-pay | Admitting: Family Medicine

## 2015-03-06 VITALS — BP 152/90 | HR 64 | Temp 98.0°F | Resp 16 | Wt 181.0 lb

## 2015-03-06 DIAGNOSIS — I10 Essential (primary) hypertension: Secondary | ICD-10-CM | POA: Diagnosis not present

## 2015-03-06 DIAGNOSIS — G4700 Insomnia, unspecified: Secondary | ICD-10-CM | POA: Diagnosis not present

## 2015-03-06 DIAGNOSIS — M158 Other polyosteoarthritis: Secondary | ICD-10-CM

## 2015-03-06 MED ORDER — CELECOXIB 200 MG PO CAPS: 200.0000 mg | ORAL_CAPSULE | Freq: Every day | ORAL | Status: DC

## 2015-03-06 MED ORDER — LISINOPRIL 10 MG PO TABS: 10.0000 mg | ORAL_TABLET | Freq: Every day | ORAL | Status: DC

## 2015-03-06 MED ORDER — GABAPENTIN 300 MG PO CAPS: 300.0000 mg | ORAL_CAPSULE | Freq: Three times a day (TID) | ORAL | Status: DC

## 2015-03-06 NOTE — Progress Notes (Signed)
Patient ID: Stacey [name], female   DOB: July 25, 1940, 74 y.o.   MRN: [id number]        Patient: Stacey [name] Female    DOB: [birth year]   74 y.o.   MRN: [id number] Visit Date: 03/06/2015  Today's Provider: Margarita Rana, MD   Chief Complaint  Patient presents with  . Hypertension   Subjective:    HPI   Hypertension, follow-up:  BP Readings from Last 3 Encounters:  03/06/15 152/90  02/06/15 146/76  12/28/14 154/84    She was last seen for hypertension 4 weeks ago.  BP at that visit was 146/76. Management changes since that visit include starting Lisinopril. She reports fair compliance with treatment. She is having side effects. Nausea and diarrhea on and off.  She is exercising. She is adherent to low salt diet.   Outside blood pressures are WNL. She is experiencing none.  Patient denies chest pain.   Cardiovascular risk factors include obesity (BMI >= 30 kg/m2).  Use of agents associated with hypertension: none.  Does not know why she is stressed but at the same time does know why she is stressed- Really worried about her brother. Gets no help with him.      Weight trend: stable Wt Readings from Last 3 Encounters:  03/06/15 181 lb (82.101 kg)  02/06/15 184 lb (83.462 kg)  12/28/14 180 lb (81.647 kg)    Current diet: in general, a "healthy" diet    ------------------------------------------------------------------------       Allergies  Allergen Reactions  . Cephalexin Anaphylaxis  . Atorvastatin   . Statins Other (See Comments)    Muscle pain  . Penicillins Hives and Rash   Previous Medications   ALBUTEROL (PROVENTIL HFA;VENTOLIN HFA) 108 (90 BASE) MCG/ACT INHALER    Inhale into the lungs.   ASPIRIN 81 MG TABLET    ASPIRIN, 81MG (Oral Tablet Delayed Release)  1 Every Day for 0 days  Quantity: 0.00;  Refills: 0   Ordered :17-November-2010  [name] ;  Started 05-Oct-2006 Active Comments: DX: 676.7   BYSTOLIC 10 MG TABLET    TAKE 1 TABLET BY  MOUTH EVERY DAY   CELECOXIB (CELEBREX) 200 MG CAPSULE    Take 1 capsule (200 mg total) by mouth 2 (two) times daily.   DIAZEPAM (VALIUM) 10 MG TABLET    TAKE 1 TABLET BY MOUTH EVERY DAY   EPIPEN 2-PAK 0.3 MG/0.3ML SOAJ INJECTION    INJECT AS DIRECTED FOR SEVERE ALLERGIC REACTION   GABAPENTIN (NEURONTIN) 300 MG CAPSULE    Take 1 capsule (300 mg total) by mouth 3 (three) times daily.   HYDROCHLOROTHIAZIDE (HYDRODIURIL) 12.5 MG TABLET    TAKE 1 TABLET BY MOUTH EVERY DAY FOR SWELLING IN FEET AND ANKLES   LEVOTHYROXINE (SYNTHROID, LEVOTHROID) 88 MCG TABLET    Take 88 mcg by mouth daily before breakfast.    LISINOPRIL (PRINIVIL,ZESTRIL) 5 MG TABLET    Take 1 tablet (5 mg total) by mouth daily.   TACROLIMUS (PROTOPIC) 0.1 % OINTMENT    TACROLIMUS, 0.1% (External Ointment) - Historical Medication  two times daily (0.1 %) Active   TEMAZEPAM (RESTORIL) 15 MG CAPSULE    Take 1 capsule (15 mg total) by mouth at bedtime as needed for sleep.   ZOLPIDEM (AMBIEN) 10 MG TABLET    Take 1 tablet (10 mg total) by mouth at bedtime.    Review of Systems  Constitutional: Negative.   Respiratory: Negative.   Gastrointestinal: Positive for nausea, abdominal pain  and diarrhea.  Neurological: Negative.     Social History  Substance Use Topics  . Smoking status: Former Research scientist (life sciences)  . Smokeless tobacco: Never Used     Comment: quit 45  years ago   . Alcohol Use: No   Objective:   BP 152/90 mmHg  Pulse 64  Temp(Src) 98 F (36.7 C) (Oral)  Resp 16  Wt 181 lb (82.101 kg)  SpO2 98% Physical Exam  Constitutional: She is oriented to person, place, and time. She appears well-developed and well-nourished.  Cardiovascular: Normal rate and regular rhythm.   Pulmonary/Chest: Effort normal and breath sounds normal.  Neurological: She is alert and oriented to person, place, and time.  Psychiatric: She has a normal mood and affect. Her behavior is normal. Judgment and thought content normal.      Assessment & Plan:       1. Essential hypertension Not at goal. Will increase Lisinopril to 10 mg daily. Recheck in 4 weeks. Check met c to make sure kidney function ok.  - Comprehensive metabolic panel  2. Cannot sleep Better with better pain control.  Will continue current medication but decrease Celebrex secondary to abdominal issues.    3. Other osteoarthritis involving multiple joints Pain is improved, but with some GI distress . Decrease dose and recheck in one months.  - celecoxib (CELEBREX) 200 MG capsule; Take 1 capsule (200 mg total) by mouth daily.  Dispense: 30 capsule; Refill: 0     Margarita Rana, MD  [facility]

## 2015-03-08 DIAGNOSIS — I10 Essential (primary) hypertension: Secondary | ICD-10-CM | POA: Diagnosis not present

## 2015-03-09 LAB — COMPREHENSIVE METABOLIC PANEL
A/G RATIO: 2.3 (ref 1.1–2.5)
ALK PHOS: 63 IU/L (ref 39–117)
ALT: 12 IU/L (ref 0–32)
AST: 16 IU/L (ref 0–40)
Albumin: 4.4 g/dL (ref 3.5–4.8)
BUN/Creatinine Ratio: 15 (ref 11–26)
BUN: 12 mg/dL (ref 8–27)
Bilirubin Total: 0.4 mg/dL (ref 0.0–1.2)
CALCIUM: 9.3 mg/dL (ref 8.7–10.3)
CO2: 27 mmol/L (ref 18–29)
Chloride: 88 mmol/L — ABNORMAL LOW (ref 97–108)
Creatinine, Ser: 0.78 mg/dL (ref 0.57–1.00)
GFR calc Af Amer: 88 mL/min/{1.73_m2} (ref >59)
GFR, EST NON AFRICAN AMERICAN: 76 mL/min/{1.73_m2} (ref >59)
Globulin, Total: 1.9 g/dL (ref 1.5–4.5)
Glucose: 108 mg/dL — ABNORMAL HIGH (ref 65–99)
POTASSIUM: 5 mmol/L (ref 3.5–5.2)
Sodium: 129 mmol/L — ABNORMAL LOW (ref 134–144)
Total Protein: 6.3 g/dL (ref 6.0–8.5)

## 2015-03-11 ENCOUNTER — Telehealth: Payer: Self-pay

## 2015-03-11 NOTE — Telephone Encounter (Signed)
-----   Message from Margarita Rana, MD sent at 03/09/2015  9:39 AM EDT ----- Labs stable except for low sodium. Recommend recheck in 4 weeks. Thanks.

## 2015-03-11 NOTE — Telephone Encounter (Signed)
Pt advised.   Thanks,   -Laura  

## 2015-04-03 ENCOUNTER — Ambulatory Visit (INDEPENDENT_AMBULATORY_CARE_PROVIDER_SITE_OTHER): Payer: Medicare Other | Admitting: Family Medicine

## 2015-04-03 ENCOUNTER — Encounter: Payer: Self-pay | Admitting: Family Medicine

## 2015-04-03 VITALS — BP 140/82 | HR 76 | Temp 98.7°F | Resp 16 | Ht 62.0 in | Wt 184.0 lb

## 2015-04-03 DIAGNOSIS — G4700 Insomnia, unspecified: Secondary | ICD-10-CM

## 2015-04-03 DIAGNOSIS — M158 Other polyosteoarthritis: Secondary | ICD-10-CM | POA: Diagnosis not present

## 2015-04-03 DIAGNOSIS — I10 Essential (primary) hypertension: Secondary | ICD-10-CM

## 2015-04-03 MED ORDER — ZOLPIDEM TARTRATE 10 MG PO TABS: 10.0000 mg | ORAL_TABLET | Freq: Every evening | ORAL | Status: DC | PRN

## 2015-04-03 MED ORDER — LISINOPRIL 10 MG PO TABS: 10.0000 mg | ORAL_TABLET | Freq: Every day | ORAL | Status: DC

## 2015-04-03 NOTE — Progress Notes (Signed)
Patient ID: SHRIKA MILOS, female   DOB: Oct 08, 1940, 74 y.o.   MRN: [id number]        Patient: Stacey [name] Female    DOB: December 27, 1940   75 y.o.   MRN: [id number] Visit Date: 04/03/2015  Today's Provider: Margarita Rana, MD   Chief Complaint  Patient presents with  . Hypertension  . Insomnia   Subjective:    HPI  Hypertension, follow-up:  BP Readings from Last 3 Encounters:  04/03/15 140/82  03/06/15 152/90  02/06/15 146/76   She was last seen for hypertension 4 weeks ago.  BP at that visit was 152/90.  Management changes since that visit include increase Lisinopril 10 mg daily, labs checked. She reports excellent compliance with treatment. She is not having side effects.  She is not exercising. She is adherent to low salt diet.   Outside blood pressures are stable. She is experiencing none.  Patient denies chest pain.   Cardiovascular risk factors include none.  Use of agents associated with hypertension: none.     Weight trend: stable Wt Readings from Last 3 Encounters:  04/03/15 184 lb (83.462 kg)  03/06/15 181 lb (82.101 kg)  02/06/15 184 lb (83.462 kg)    Current diet: in general, a "healthy" diet       Follow up for insomnia  The patient was last seen for this 4 weeks ago. Changes made at last visit include decrease celebrex.  She reports excellent compliance with treatment. She feels that condition is Unchanged. She is not having side effects.  Concluded that she just is not going to bet a good nights sleep. Does take Ambien very occasionally.    Follow up for osteoarthritis  The patient was last seen for this 4 weeks ago. Changes made at last visit include as above.  She reports excellent compliance with treatment. She feels that condition is improved some.  She is not having side effects.  Can move without aching.     Allergies  Allergen Reactions  . Cephalexin Anaphylaxis  . Atorvastatin   . Statins Other (See Comments)   Muscle pain  . Penicillins Hives and Rash   Previous Medications   ALBUTEROL (PROVENTIL HFA;VENTOLIN HFA) 108 (90 BASE) MCG/ACT INHALER    Inhale into the lungs.   ASPIRIN 81 MG TABLET    ASPIRIN, 81MG  (Oral Tablet Delayed Release)  1 Every Day for 0 days  Quantity: 0.00;  Refills: 0   Ordered :17-November-2010  [name] ;  Started 05-Oct-2006 Active Comments: DX: 681.2   BYSTOLIC 10 MG TABLET    TAKE 1 TABLET BY MOUTH EVERY DAY   CELECOXIB (CELEBREX) 200 MG CAPSULE    Take 1 capsule (200 mg total) by mouth daily.   DIAZEPAM (VALIUM) 10 MG TABLET    TAKE 1 TABLET BY MOUTH EVERY DAY   EPIPEN 2-PAK 0.3 MG/0.3ML SOAJ INJECTION    INJECT AS DIRECTED FOR SEVERE ALLERGIC REACTION   GABAPENTIN (NEURONTIN) 300 MG CAPSULE    Take 1 capsule (300 mg total) by mouth 3 (three) times daily.   HYDROCHLOROTHIAZIDE (HYDRODIURIL) 12.5 MG TABLET    TAKE 1 TABLET BY MOUTH EVERY DAY FOR SWELLING IN FEET AND ANKLES   LEVOTHYROXINE (SYNTHROID, LEVOTHROID) 88 MCG TABLET    Take 88 mcg by mouth daily before breakfast.    LISINOPRIL (PRINIVIL,ZESTRIL) 10 MG TABLET    Take 1 tablet (10 mg total) by mouth daily.   TACROLIMUS (PROTOPIC) 0.1 % OINTMENT    TACROLIMUS, 0.1% (  External Ointment) - Historical Medication  two times daily (0.1 %) Active   TEMAZEPAM (RESTORIL) 15 MG CAPSULE       ZOLPIDEM (AMBIEN) 10 MG TABLET    Take 1 tablet (10 mg total) by mouth at bedtime.    Review of Systems  Social History  Substance Use Topics  . Smoking status: Former Research scientist (life sciences)  . Smokeless tobacco: Never Used     Comment: quit 45  years ago   . Alcohol Use: No   Objective:   BP 140/82 mmHg  Pulse 76  Temp(Src) 98.7 F (37.1 C) (Oral)  Resp 16  Ht 5\' 2"  (1.575 m)  Wt 184 lb (83.462 kg)  BMI 33.65 kg/m2  Physical Exam  Constitutional: She is oriented to person, place, and time. She appears well-developed and well-nourished.  Cardiovascular: Normal rate and regular rhythm.   Pulmonary/Chest: Effort normal and breath sounds  normal.  Neurological: She is alert and oriented to person, place, and time.  Psychiatric: She has a normal mood and affect. Her behavior is normal. Judgment and thought content normal.      Assessment & Plan:     1. Essential hypertension Condition is stable. Please continue current medication and  plan of care as noted.   - Comprehensive metabolic panel - lisinopril (PRINIVIL,ZESTRIL) 10 MG tablet; Take 1 tablet (10 mg total) by mouth daily.  Dispense: 90 tablet; Refill: 3  2. Cannot sleep Will refill medication to be used as needed.  - zolpidem (AMBIEN) 10 MG tablet; Take 1 tablet (10 mg total) by mouth at bedtime as needed.  Dispense: 30 tablet; Refill: 5  3. Other osteoarthritis involving multiple joints Will take medication as needed.  Has improved on current medication as above. Does not want to change medication at this time.  - zolpidem (AMBIEN) 10 MG tablet; Take 1 tablet (10 mg total) by mouth at bedtime as needed.  Dispense: 30 tablet; Refill: [place], MD  [facility]

## 2015-04-09 DIAGNOSIS — I10 Essential (primary) hypertension: Secondary | ICD-10-CM | POA: Diagnosis not present

## 2015-04-10 LAB — COMPREHENSIVE METABOLIC PANEL
A/G RATIO: 2.3 (ref 1.1–2.5)
ALT: 10 IU/L (ref 0–32)
AST: 15 IU/L (ref 0–40)
Albumin: 4.2 g/dL (ref 3.5–4.8)
Alkaline Phosphatase: 61 IU/L (ref 39–117)
BUN/Creatinine Ratio: 23 (ref 11–26)
BUN: 15 mg/dL (ref 8–27)
Bilirubin Total: 0.3 mg/dL (ref 0.0–1.2)
CALCIUM: 9.1 mg/dL (ref 8.7–10.3)
CO2: 29 mmol/L (ref 18–29)
Chloride: 93 mmol/L — ABNORMAL LOW (ref 97–106)
Creatinine, Ser: 0.65 mg/dL (ref 0.57–1.00)
GFR, EST AFRICAN AMERICAN: 103 mL/min/{1.73_m2} (ref >59)
GFR, EST NON AFRICAN AMERICAN: 89 mL/min/{1.73_m2} (ref >59)
GLOBULIN, TOTAL: 1.8 g/dL (ref 1.5–4.5)
Glucose: 147 mg/dL — ABNORMAL HIGH (ref 65–99)
POTASSIUM: 3.7 mmol/L (ref 3.5–5.2)
SODIUM: 137 mmol/L (ref 136–144)
Total Protein: 6 g/dL (ref 6.0–8.5)

## 2015-04-11 ENCOUNTER — Telehealth: Payer: Self-pay

## 2015-04-11 NOTE — Telephone Encounter (Signed)
Pt advised.   Thanks,   -Laura  

## 2015-04-11 NOTE — Telephone Encounter (Signed)
-----   Message from Margarita Rana, MD sent at 04/10/2015  7:03 AM EST ----- Labs stable. Please notify patient. Thanks.

## 2015-04-12 ENCOUNTER — Other Ambulatory Visit: Payer: Self-pay | Admitting: Family Medicine

## 2015-04-12 DIAGNOSIS — E039 Hypothyroidism, unspecified: Secondary | ICD-10-CM

## 2015-04-15 ENCOUNTER — Encounter: Payer: Self-pay | Admitting: Family Medicine

## 2015-04-15 ENCOUNTER — Ambulatory Visit (INDEPENDENT_AMBULATORY_CARE_PROVIDER_SITE_OTHER): Payer: Medicare Other | Admitting: Family Medicine

## 2015-04-15 VITALS — BP 140/82 | HR 72 | Temp 98.8°F | Resp 16 | Wt 184.0 lb

## 2015-04-15 DIAGNOSIS — H65111 Acute and subacute allergic otitis media (mucoid) (sanguinous) (serous), right ear: Secondary | ICD-10-CM | POA: Diagnosis not present

## 2015-04-15 DIAGNOSIS — H6690 Otitis media, unspecified, unspecified ear: Secondary | ICD-10-CM | POA: Insufficient documentation

## 2015-04-15 DIAGNOSIS — H6981 Other specified disorders of Eustachian tube, right ear: Secondary | ICD-10-CM | POA: Diagnosis not present

## 2015-04-15 MED ORDER — SULFAMETHOXAZOLE-TRIMETHOPRIM 800-160 MG PO TABS: 1.0000 | ORAL_TABLET | Freq: Two times a day (BID) | ORAL | Status: DC

## 2015-04-15 MED ORDER — FLUTICASONE PROPIONATE 50 MCG/ACT NA SUSP: 2.0000 | Freq: Every day | NASAL | Status: DC

## 2015-04-15 NOTE — Progress Notes (Signed)
Name: Stacey [name]   MRN: [id number]    DOB: [birth year]   Date:04/15/2015       Progress Note  Subjective  Chief Complaint  Chief Complaint  Patient presents with  . Otalgia    right ear since yesterday. Also c/o H/A    HPI   74 yo female with ear fullness and pain since Saturday.  Comes and goes.   Also started with congestion today. Did not try any medication, but did use ear drops that Dr. Tami Ribas started for her previously.  Did not really help. Pain is about a 6 out of 10.  Can not pop ear drum. Has perforated her ear drum in the past, last time about one and a half to two years ago.  No fevers. Is not taking medication for allergies. Has a lot going on. Having family for Thanksgiving.  Does not want to be sick.   Chronic problems unchanged.    Past Medical History  Diagnosis Date  . Cancer ([place])   . Glaucoma   . Thyroid disease   . Hypertension   . Depression     Social History  Substance Use Topics  . Smoking status: Former Research scientist (life sciences)  . Smokeless tobacco: Never Used     Comment: quit 45  years ago   . Alcohol Use: No     Current outpatient prescriptions:  .  albuterol (PROVENTIL HFA;VENTOLIN HFA) 108 (90 BASE) MCG/ACT inhaler, Inhale into the lungs., Disp: , Rfl:  .  aspirin 81 MG tablet, ASPIRIN, 81MG  (Oral Tablet Delayed Release)  1 Every Day for 0 days  Quantity: 0.00;  Refills: 0   Ordered :17-November-2010  [name] ;  Started 05-Oct-2006 Active Comments: DX: 401.9, Disp: , Rfl:  .  BYSTOLIC 10 MG tablet, TAKE 1 TABLET BY MOUTH EVERY DAY, Disp: 90 tablet, Rfl: 2 .  celecoxib (CELEBREX) 200 MG capsule, Take 1 capsule (200 mg total) by mouth daily., Disp: 30 capsule, Rfl: 0 .  diazepam (VALIUM) 10 MG tablet, TAKE 1 TABLET BY MOUTH EVERY DAY, Disp: 30 tablet, Rfl: 5 .  EPIPEN 2-PAK 0.3 MG/0.3ML SOAJ injection, INJECT AS DIRECTED FOR SEVERE ALLERGIC REACTION, Disp: , Rfl: 1 .  gabapentin (NEURONTIN) 300 MG capsule, Take 1 capsule (300 mg total) by mouth 3  (three) times daily., Disp: 90 capsule, Rfl: 3 .  hydrochlorothiazide (HYDRODIURIL) 12.5 MG tablet, TAKE 1 TABLET BY MOUTH EVERY DAY FOR SWELLING IN FEET AND ANKLES, Disp: 90 tablet, Rfl: 3 .  levothyroxine (SYNTHROID, LEVOTHROID) 88 MCG tablet, TAKE 1 TABLET EVERY DAY, Disp: 90 tablet, Rfl: 3 .  lisinopril (PRINIVIL,ZESTRIL) 10 MG tablet, Take 1 tablet (10 mg total) by mouth daily., Disp: 90 tablet, Rfl: 3 .  tacrolimus (PROTOPIC) 0.1 % ointment, TACROLIMUS, 0.1% (External Ointment) - Historical Medication  two times daily (0.1 %) Active, Disp: , Rfl:  .  temazepam (RESTORIL) 15 MG capsule, , Disp: , Rfl:  .  zolpidem (AMBIEN) 10 MG tablet, Take 1 tablet (10 mg total) by mouth at bedtime as needed., Disp: 30 tablet, Rfl: 5  Allergies  Allergen Reactions  . Cephalexin Anaphylaxis  . Atorvastatin   . Statins Other (See Comments)    Muscle pain  . Penicillins Hives and Rash    Review of Systems  Constitutional: Negative for fever and chills.  HENT: Positive for congestion and ear pain. Negative for ear discharge.   Respiratory: Negative for cough and hemoptysis.   Cardiovascular: Negative for chest pain and palpitations.  Neurological: Positive for headaches.    Objective  Filed Vitals:   04/15/15 1502  BP: 140/82  Pulse: 72  Temp: 98.8 F (37.1 C)  TempSrc: Oral  Resp: 16  Weight: 184 lb (83.462 kg)     Physical Exam  Constitutional: She is oriented to person, place, and time and well-developed, well-nourished, and in no distress.  HENT:  Head: Normocephalic and atraumatic.  Left Ear: External ear normal.  Mouth/Throat: Oropharynx is clear and moist.  Right TM dull.   Neck: Normal range of motion. Neck supple.  Cardiovascular: Normal rate and regular rhythm.   Pulmonary/Chest: Effort normal and breath sounds normal.  Musculoskeletal: Normal range of motion.  Neurological: She is alert and oriented to person, place, and time.  Psychiatric: Mood, memory, affect and  judgment normal.     Recent Results (from the past 2160 hour(s))  Comprehensive metabolic panel     Status: Abnormal   Collection Time: 03/08/15  8:23 AM  Result Value Ref Range   Glucose 108 (H) 65 - 99 mg/dL   BUN 12 8 - 27 mg/dL   Creatinine, Ser 0.78 0.57 - 1.00 mg/dL   GFR calc non Af Amer 76 >59 mL/min/1.73   GFR calc Af Amer 88 >59 mL/min/1.73   BUN/Creatinine Ratio 15 11 - 26   Sodium 129 (L) 134 - 144 mmol/L    Comment: **Effective March 11, 2015 the reference interval**   for Sodium, Serum will be changing to:                                             136 - 144    Potassium 5.0 3.5 - 5.2 mmol/L    Comment: **Effective March 11, 2015 the reference interval**   for Potassium, Serum will be changing to:                          0 -  7 days        3.7 - 5.2                          8 - 30 days        3.7 - 6.4                          1 -  6 months      3.8 - 6.0                   7 months -  1 year        3.8 - 5.3                              >1 year        3.5 - 5.2    Chloride 88 (L) 97 - 108 mmol/L    Comment: **Effective March 11, 2015 the reference interval**   for Chloride, Serum will be changing to:                                              97 - 106  CO2 27 18 - 29 mmol/L   Calcium 9.3 8.7 - 10.3 mg/dL   Total Protein 6.3 6.0 - 8.5 g/dL   Albumin 4.4 3.5 - 4.8 g/dL   Globulin, Total 1.9 1.5 - 4.5 g/dL   Albumin/Globulin Ratio 2.3 1.1 - 2.5   Bilirubin Total 0.4 0.0 - 1.2 mg/dL   Alkaline Phosphatase 63 39 - 117 IU/L   AST 16 0 - 40 IU/L   ALT 12 0 - 32 IU/L  Comprehensive metabolic panel     Status: Abnormal   Collection Time: 04/09/15  3:47 PM  Result Value Ref Range   Glucose 147 (H) 65 - 99 mg/dL   BUN 15 8 - 27 mg/dL   Creatinine, Ser 0.65 0.57 - 1.00 mg/dL   GFR calc non Af Amer 89 >59 mL/min/1.73   GFR calc Af Amer 103 >59 mL/min/1.73   BUN/Creatinine Ratio 23 11 - 26   Sodium 137 136 - 144 mmol/L   Potassium 3.7 3.5 - 5.2 mmol/L    Chloride 93 (L) 97 - 106 mmol/L   CO2 29 18 - 29 mmol/L   Calcium 9.1 8.7 - 10.3 mg/dL   Total Protein 6.0 6.0 - 8.5 g/dL   Albumin 4.2 3.5 - 4.8 g/dL   Globulin, Total 1.8 1.5 - 4.5 g/dL   Albumin/Globulin Ratio 2.3 1.1 - 2.5   Bilirubin Total 0.3 0.0 - 1.2 mg/dL   Alkaline Phosphatase 61 39 - 117 IU/L   AST 15 0 - 40 IU/L   ALT 10 0 - 32 IU/L     Assessment & Plan  1. Acute allergic otitis media of right ear, recurrence not specified Unclear if developing an secondary infection or just experiencing EUD, will start Flonase and add antibiotic if worsens or does not improve.   - fluticasone (FLONASE) 50 MCG/ACT nasal spray; Place 2 sprays into both nostrils daily.  Dispense: 16 g; Refill: 6 - sulfamethoxazole-trimethoprim (BACTRIM DS,SEPTRA DS) 800-160 MG tablet; Take 1 tablet by mouth 2 (two) times daily.  Dispense: 20 tablet; Refill: 0  2. Eustachian tube dysfunction, right Will try steroid nasal spray. Patient instructed to call back if condition worsens or does not improve.     Margarita Rana, MD

## 2015-04-29 ENCOUNTER — Other Ambulatory Visit: Payer: Self-pay | Admitting: Family Medicine

## 2015-04-29 DIAGNOSIS — M797 Fibromyalgia: Secondary | ICD-10-CM

## 2015-05-24 ENCOUNTER — Ambulatory Visit (INDEPENDENT_AMBULATORY_CARE_PROVIDER_SITE_OTHER): Payer: Medicare Other | Admitting: Family Medicine

## 2015-05-24 ENCOUNTER — Encounter: Payer: Self-pay | Admitting: Family Medicine

## 2015-05-24 VITALS — BP 172/98 | HR 72 | Temp 98.4°F | Resp 16 | Wt 185.0 lb

## 2015-05-24 DIAGNOSIS — M79671 Pain in right foot: Secondary | ICD-10-CM

## 2015-05-24 DIAGNOSIS — I10 Essential (primary) hypertension: Secondary | ICD-10-CM | POA: Diagnosis not present

## 2015-05-24 MED ORDER — AMLODIPINE BESYLATE 2.5 MG PO TABS: 2.5000 mg | ORAL_TABLET | Freq: Every day | ORAL | Status: DC

## 2015-05-24 NOTE — Progress Notes (Signed)
Subjective:    Patient ID: Stacey [name], female    DOB: [birth year], 74 y.o.   MRN: [id number]  HPI  SUBJECTIVE: Stacey [name] is a 74 y.o. female who is c/o  right foot pain since 3 day(s) ago. Mechanism of injury: No injury per pt. Symptoms: pain, swelling, deformity, erythema, warmth at times. Symptoms (throbbing) have been worsening since that time. Prior history of related problems: PMH of blood clot that traveled to lungs. Pt is concerned of a clot today. Pt notes swelling, redness, pain of right foot.   Also with elevated BP. Worse recently. Has been up at home. Did read 172 over 96 at home.   Did go back down. Never goes down very low.   Always at least in the 130s to 140s, and those are on the good days.    Review of Systems  Constitutional: Negative for fever, chills, diaphoresis, activity change, appetite change, fatigue and unexpected weight change.  Respiratory: Positive for cough. Negative for shortness of breath and wheezing.   Cardiovascular: Positive for leg swelling (right foot). Negative for chest pain and palpitations.  Musculoskeletal: Negative for gait problem. Arthralgias: now with foot pain as noted.    Neurological: Positive for numbness (right great toe) and headaches. Negative for dizziness and light-headedness.   BP 172/98 mmHg  Pulse 72  Temp(Src) 98.4 F (36.9 C) (Oral)  Resp 16  Wt 185 lb (83.915 kg)   Patient Active Problem List   Diagnosis Date Noted  . Otitis media 04/15/2015  . Other osteoarthritis involving multiple joints 02/06/2015  . Adult BMI 30+ 09/27/2014  . Clinical depression 09/27/2014  . Dry mouth 09/27/2014  . Accumulation of fluid in tissues 09/27/2014  . Fibrositis 09/27/2014  . Cephalalgia 09/27/2014  . LBP (low back pain) 09/27/2014  . Screening for osteoporosis 09/27/2014  . Hyperthyroidism, subclinical 09/27/2014  . Avitaminosis D 09/27/2014  . Decreased leukocytes 09/27/2014  . History of colon polyps  11/14/2009  . Abnormal blood sugar 05/03/2009  . Cannot sleep 09/29/2007  . BP (high blood pressure) 03/09/2007  . Adaptation reaction 01/29/2007  . Acid reflux 10/07/2006  . Menopausal and postmenopausal disorder 10/05/2006  . Headache, migraine 10/05/2006  . Arthritis, degenerative 03/23/1994  . Adult hypothyroidism 04/15/1993  . Hypercholesteremia 03/25/1993   Past Medical History  Diagnosis Date  . Cancer ([place])   . Glaucoma   . Thyroid disease   . Hypertension   . Depression    Current Outpatient Prescriptions on File Prior to Visit  Medication Sig  . albuterol (PROVENTIL HFA;VENTOLIN HFA) 108 (90 BASE) MCG/ACT inhaler Inhale into the lungs.  Marland Kitchen aspirin 81 MG tablet ASPIRIN, 81MG  (Oral Tablet Delayed Release)  1 Every Day for 0 days  Quantity: 0.00;  Refills: 0   Ordered :17-November-2010  [name] ;  Started 05-Oct-2006 Active Comments: DX: 401.9  . BYSTOLIC 10 MG tablet TAKE 1 TABLET BY MOUTH EVERY DAY  . celecoxib (CELEBREX) 200 MG capsule Take 1 capsule (200 mg total) by mouth daily.  . diazepam (VALIUM) 10 MG tablet TAKE 1 TABLET BY MOUTH EVERY DAY  . EPIPEN 2-PAK 0.3 MG/0.3ML SOAJ injection INJECT AS DIRECTED FOR SEVERE ALLERGIC REACTION  . fluticasone (FLONASE) 50 MCG/ACT nasal spray Place 2 sprays into both nostrils daily.  Marland Kitchen gabapentin (NEURONTIN) 300 MG capsule Take 1 capsule (300 mg total) by mouth 3 (three) times daily.  . hydrochlorothiazide (HYDRODIURIL) 12.5 MG tablet TAKE 1 TABLET BY MOUTH EVERY DAY FOR SWELLING IN  FEET AND ANKLES  . levothyroxine (SYNTHROID, LEVOTHROID) 88 MCG tablet TAKE 1 TABLET EVERY DAY  . lisinopril (PRINIVIL,ZESTRIL) 10 MG tablet Take 1 tablet (10 mg total) by mouth daily.  . tacrolimus (PROTOPIC) 0.1 % ointment TACROLIMUS, 0.1% (External Ointment) - Historical Medication  two times daily (0.1 %) Active  . zolpidem (AMBIEN) 10 MG tablet Take 1 tablet (10 mg total) by mouth at bedtime as needed.   No current facility-administered  medications on file prior to visit.   Allergies  Allergen Reactions  . Cephalexin Anaphylaxis  . Atorvastatin   . Statins Other (See Comments)    Muscle pain  . Penicillins Hives and Rash   Past Surgical History  Procedure Laterality Date  . Abdominal hysterectomy    . Nasal sinus surgery    . Right oophorectomy    . Appendectomy    . Bladder surgery     Social History   Social History  . Marital Status: Married    Spouse Name: [name]  . Number of Children: 3  . Years of Education: N/A   Occupational History  . Not on file.   Social History Main Topics  . Smoking status: Former Research scientist (life sciences)  . Smokeless tobacco: Never Used     Comment: quit 45  years ago   . Alcohol Use: No  . Drug Use: No  . Sexual Activity: Not on file   Other Topics Concern  . Not on file   Social History Narrative   Family History  Problem Relation Age of Onset  . Alzheimer's disease Mother   . Kidney cancer Mother   . Heart attack Father   . Pancreatic cancer Sister   . Heart attack Brother 41  . Ovarian cancer Sister   . Kidney disease Sister     dialysis  . Cancer Sister     uterin  . Heart attack Brother 56      Objective:   Physical Exam  Constitutional: She is oriented to person, place, and time. She appears well-developed and well-nourished.  Cardiovascular: Normal rate and regular rhythm.   Pulmonary/Chest: Effort normal and breath sounds normal.  Musculoskeletal:       Right foot: There is no swelling (there is an area of a 5 palpable firm cysts beneath surface).  +2 pulses in feet bilaterally  Neurological: She is alert and oriented to person, place, and time.  Psychiatric: She has a normal mood and affect. Her behavior is normal.  BP 172/98 mmHg  Pulse 72  Temp(Src) 98.4 F (36.9 C) (Oral)  Resp 16  Wt 185 lb (83.915 kg)     Assessment & Plan:  1. Essential hypertension Worsening. Will increase medication and recheck in 4 weeks.   - amLODipine (NORVASC) 2.5 MG  tablet; Take 1 tablet (2.5 mg total) by mouth daily.  Dispense: 90 tablet; Refill: 3  2. Foot pain, right New problem. Will refer.  - Ambulatory referral to Podiatry    Margarita Rana, MD

## 2015-06-13 ENCOUNTER — Ambulatory Visit (INDEPENDENT_AMBULATORY_CARE_PROVIDER_SITE_OTHER): Payer: Medicare Other | Admitting: Podiatry

## 2015-06-13 ENCOUNTER — Telehealth: Payer: Self-pay

## 2015-06-13 ENCOUNTER — Ambulatory Visit: Payer: Medicare Other

## 2015-06-13 ENCOUNTER — Other Ambulatory Visit: Payer: Self-pay | Admitting: Podiatry

## 2015-06-13 VITALS — BP 181/89

## 2015-06-13 DIAGNOSIS — M799 Soft tissue disorder, unspecified: Secondary | ICD-10-CM | POA: Diagnosis not present

## 2015-06-13 DIAGNOSIS — R52 Pain, unspecified: Secondary | ICD-10-CM | POA: Diagnosis not present

## 2015-06-13 DIAGNOSIS — M779 Enthesopathy, unspecified: Secondary | ICD-10-CM | POA: Diagnosis not present

## 2015-06-13 DIAGNOSIS — M7989 Other specified soft tissue disorders: Secondary | ICD-10-CM

## 2015-06-13 NOTE — Telephone Encounter (Signed)
Was up at podiatrist. Coming down now.  Take another amlodipine and bystolic. Did not feel well.  Is better now. Did get short of breath.   Feels better now.   Not sure if reacted to anesthesia again.   Any worsening symptoms. Call 911 if has symptoms.   Call back tomorrow.  Thanks.

## 2015-06-13 NOTE — Telephone Encounter (Signed)
Patient called and states her B/P is still running high, she was at podiatrist office earlier today and it was very high around 200 on top, she took it now and it is 204/96. She was last seen on December 30th with a high B/P adjustment to medication was done but patient states that B/P was still running high and did not improve when the change was made. Patient is having dizziness, headache -severe, possibly SOB-she states she did not know how she felt just feels off. Advised patient we will call back today and to rest and try not to move too much. Advised patient if she develops chest tightness or pain, any numbness she needs to call 911. Her husband is with her right now. Please review-aa

## 2015-06-13 NOTE — Progress Notes (Signed)
Patient ID: Stacey [name], female   DOB: 22-Mar-1941, 75 y.o.   MRN: [id number]  Subjective: 75 year old female presents the office with concerns of not so the top of her right foot which has been ongoing for approximate one month. She has seen her primary care physician for this and they were unsure what was going on and she was concerned for blood clot and she did see a vascular doctor he was told her she did not have a blood clot but they were unsure of what the issue was. She denies any recent injury or trauma to the area. She states that the area does swell and become red at times. Majority of symptoms appear to be localized the first big toe joint that she points to. No numbness or tingling. Denies any systemic complaints such as fevers, chills, nausea, vomiting. No acute changes since last appointment, and no other complaints at this time.   Objective: AAO x3, NAD DP/PT pulses palpable bilaterally, CRT less than 3 seconds Protective sensation intact with Simms Weinstein monofilament On the dorsal aspect of the right first MTPJ and just proximal along the course of the extensor tendon there does appear to be a fluid-filled soft tissue mass which is somewhat mobile. There is no overlying edema, erythema, increased warmth. There is mild tenderness overlying this area. No other areas of tenderness of bilateral lower extremities. No areas of pinpoint bony tenderness or pain with vibratory sensation. MMT 5/5, ROM WNL. No edema, erythema, increase in warmth to bilateral lower extremities.  No open lesions or pre-ulcerative lesions.  No pain with calf compression, swelling, warmth, erythema  Assessment: 75 year old female with soft tissue mass right foot, synovial fluid inflammation  Plan: -All treatment options discussed with the patient including all alternatives, risks, complications.  -X-rays were obtained and reviewed with the patient.  -I discussed aspiration and the soft tissue mass the  patient for which she understands the risks and consequences and wishes to proceed. Under sterile conditions a total of 3 mL of a one-to-one mixture of 2% lidocaine plain and 0.5% Marcaine plain was infiltrated in a regional block fashion. Once anesthetized the skin was prepped in sterile fashion. An 18-gauge needle was utilized after the soft tissue mass. Upon initial puncture fluid was expressed and the fluid did appear to be cleared is somewhat yellow in nature and was thin. There is no purulence. No gel like substance. No crystals. Fluid was sent for analysis although there was only a small amount of able to actually aspirate. 0.5 mL Kenalog-10 was infiltrated into the area. Postinjection care was discussed. - Fluid was sent for  Cell count. -Follow-up in 2 weeks or sooner if any problems arise. In the meantime, encouraged to call the office with any questions, concerns, change in symptoms.   *During today's appointment the patient did become flushed and diaphoretic and her blood pressure was 181/89. She denies any chest pain or shortness of breath. She states that she does  Have symptoms intermittently. I recommended her going emergency room where she declined this. She did get a ride home and her husband was made aware the symptoms and if she was not improving that she needs to go immediately to the emergency room however she declined this at this time. I discussed with her the risks and complications and possible sequela of this is not checked out and if her blood pressure states elevated she could have a heart attack or stroke.  Celesta Gentile, DPM

## 2015-06-14 ENCOUNTER — Telehealth: Payer: Self-pay | Admitting: *Deleted

## 2015-06-14 NOTE — Telephone Encounter (Signed)
Called patient this morning to ask how patient was feeling after patient was in the [place] office yesterday with an elevated BP and patient stated that it was 162/89 and talked to her doctor last night and her doctor doubled her medicines. Lattie Haw

## 2015-06-15 ENCOUNTER — Ambulatory Visit (INDEPENDENT_AMBULATORY_CARE_PROVIDER_SITE_OTHER): Payer: Medicare Other | Admitting: Family Medicine

## 2015-06-15 VITALS — BP 178/90 | HR 66 | Temp 98.5°F | Resp 18

## 2015-06-15 DIAGNOSIS — I10 Essential (primary) hypertension: Secondary | ICD-10-CM

## 2015-06-15 MED ORDER — LISINOPRIL 10 MG PO TABS: 20.0000 mg | ORAL_TABLET | Freq: Every day | ORAL | Status: DC

## 2015-06-15 MED ORDER — NEBIVOLOL HCL 10 MG PO TABS: 20.0000 mg | ORAL_TABLET | Freq: Every day | ORAL | Status: DC

## 2015-06-15 MED ORDER — HYDROCHLOROTHIAZIDE 12.5 MG PO TABS: 25.0000 mg | ORAL_TABLET | Freq: Every day | ORAL | Status: DC

## 2015-06-15 NOTE — Patient Instructions (Addendum)
Start taking 2 lisinopril 10mg  tablets every day starting today Start taking 2 hydrochlorothiazide 12.5 mg tablets every day starting today Start taking 2 bystolic 10mg  every day starting tomorrow Continue 1 amlodipine 2.5mg  every day  Avoid sodium in diet as much as possible, under 2000mg  a day.

## 2015-06-15 NOTE — Progress Notes (Signed)
Patient ID: Stacey [name], female   DOB: [birth year], 75 y.o.   MRN: [id number]       Patient: Stacey [name] Female    DOB: Jun 26, 1940   76 y.o.   MRN: [id number] Visit Date: 06/15/2015  Today's Provider: Lelon Huh, MD   Chief Complaint  Patient presents with  . Hypertension   Subjective:    HPI  This is a new patient to me who usually sees Dr. Venia Minks presenting today with very high blood pressures at home. She was at podiatrist yesterday and states BP was in the 200s. She was given injection of steroids in right foot. She called  and Dr. Venia Minks advised her to take additional Amlodipine 2.5 and Bystolic 20 and after that B/P came down to 168/83.  She has only taken Bystolic today and BP before coming to the office in the 190s. She has had persistent bifrontal headaches when her BP is up. She also has long history of fibromyalgia which has been worse lately. She feels anxiuos about her B/P and what can she do to get this improved. she was started on lisinopril 5mg  in September and increased to 10mg  In October. Added 2.5mg  amlodipine on 05/24/2015. Denies chest pains shortness of breath and mental status changes, but has more fatigued the last several weeks.     Allergies  Allergen Reactions  . Cephalexin Anaphylaxis  . Atorvastatin   . Statins Other (See Comments)    Muscle pain  . Penicillins Hives and Rash   Previous Medications   ALBUTEROL (PROVENTIL HFA;VENTOLIN HFA) 108 (90 BASE) MCG/ACT INHALER    Inhale into the lungs.   AMLODIPINE (NORVASC) 2.5 MG TABLET    Take 1 tablet (2.5 mg total) by mouth daily.   ASPIRIN 81 MG TABLET    ASPIRIN, 81MG  (Oral Tablet Delayed Release)  1 Every Day for 0 days  Quantity: 0.00;  Refills: 0   Ordered :17-November-2010  [name] ;  Started 05-Oct-2006 Active Comments: DX: [id number]   BYSTOLIC 10 MG TABLET    TAKE 1 TABLET BY MOUTH EVERY DAY   CELECOXIB (CELEBREX) 200 MG CAPSULE    Take 1 capsule (200 mg total) by mouth daily.   DIAZEPAM  (VALIUM) 10 MG TABLET    TAKE 1 TABLET BY MOUTH EVERY DAY   EPIPEN 2-PAK 0.3 MG/0.3ML SOAJ INJECTION    INJECT AS DIRECTED FOR SEVERE ALLERGIC REACTION   FLUTICASONE (FLONASE) 50 MCG/ACT NASAL SPRAY    Place 2 sprays into both nostrils daily.   GABAPENTIN (NEURONTIN) 300 MG CAPSULE    Take 1 capsule (300 mg total) by mouth 3 (three) times daily.   HYDROCHLOROTHIAZIDE (HYDRODIURIL) 12.5 MG TABLET    TAKE 1 TABLET BY MOUTH EVERY DAY FOR SWELLING IN FEET AND ANKLES   LEVOTHYROXINE (SYNTHROID, LEVOTHROID) 88 MCG TABLET    TAKE 1 TABLET EVERY DAY   LISINOPRIL (PRINIVIL,ZESTRIL) 10 MG TABLET    Take 1 tablet (10 mg total) by mouth daily.   TACROLIMUS (PROTOPIC) 0.1 % OINTMENT    TACROLIMUS, 0.1% (External Ointment) - Historical Medication  two times daily (0.1 %) Active   ZOLPIDEM (AMBIEN) 10 MG TABLET    Take 1 tablet (10 mg total) by mouth at bedtime as needed.    Review of Systems  Constitutional: Negative.   HENT: Positive for congestion.   Respiratory: Negative.   Cardiovascular: Negative.   Neurological: Positive for dizziness, light-headedness and headaches.  Psychiatric/Behavioral: The patient is nervous/anxious.  Social History  Substance Use Topics  . Smoking status: Former Research scientist (life sciences)  . Smokeless tobacco: Never Used     Comment: quit 45  years ago   . Alcohol Use: No   Objective:     Filed Vitals:   06/15/15 1152 06/15/15 1219  BP: 194/82 178/90  Pulse: 66   Temp: 98.5 F (36.9 C)   Resp: 18      Physical Exam   General Appearance:    Alert, cooperative, no distress  Eyes:    PERRL, conjunctiva/corneas clear, EOM's intact       Lungs:     Clear to auscultation bilaterally, respirations unlabored  Heart:    Regular rate and rhythm  Neurologic:   Awake, alert, oriented x 3. No apparent focal neurological           defect.           Assessment & Plan:     1. Essential hypertension May be exacerbated by worsening fibromyalgia, anxiety, possibly NSAIDs. She has  put Celebrex on hold.   Patient Instructions  Start taking 2 lisinopril 10mg  tablets every day starting today Start taking 2 hydrochlorothiazide 12.5 mg tablets every day starting today Start taking 2 bystolic 10mg  every day starting tomorrow Continue 1 amlodipine 2.5mg  every day  Avoid sodium in diet as much as possible, under 2000mg  a day.   go to ER if she develops any chest pain, shortness of breath, mental status changes or BP>220.   Otherwise follow up here in 5 day for BP check an additional evaluation.         Lelon Huh, MD  [facility]

## 2015-06-17 LAB — SYNOVIAL FLUID, CELL COUNT
Eos, Fluid: 2 %
Lining Cells, Synovial: 0 %
Lymphs, Fluid: 28 %
MACROPHAGES FLD: 40 %
Polys, Fluid: 30 %

## 2015-06-19 ENCOUNTER — Encounter: Payer: Self-pay | Admitting: Family Medicine

## 2015-06-19 ENCOUNTER — Ambulatory Visit (INDEPENDENT_AMBULATORY_CARE_PROVIDER_SITE_OTHER): Payer: Medicare Other | Admitting: Family Medicine

## 2015-06-19 VITALS — BP 150/80 | HR 53 | Temp 98.9°F | Resp 16 | Wt 184.0 lb

## 2015-06-19 DIAGNOSIS — E039 Hypothyroidism, unspecified: Secondary | ICD-10-CM | POA: Diagnosis not present

## 2015-06-19 DIAGNOSIS — I10 Essential (primary) hypertension: Secondary | ICD-10-CM

## 2015-06-19 LAB — POCT URINALYSIS DIPSTICK
Bilirubin, UA: NEGATIVE
GLUCOSE UA: NEGATIVE
Leukocytes, UA: NEGATIVE
Nitrite, UA: NEGATIVE
Protein, UA: NEGATIVE
SPEC GRAV UA: 1.01
UROBILINOGEN UA: 0.2
pH, UA: 6

## 2015-06-19 MED ORDER — NEBIVOLOL HCL 20 MG PO TABS: 20.0000 mg | ORAL_TABLET | Freq: Every day | ORAL | Status: DC

## 2015-06-19 MED ORDER — LISINOPRIL-HYDROCHLOROTHIAZIDE 20-25 MG PO TABS: 1.0000 | ORAL_TABLET | Freq: Every day | ORAL | Status: DC

## 2015-06-19 NOTE — Progress Notes (Signed)
Patient: Stacey [name] Female    DOB: Jan 02, 1941   75 y.o.   MRN: [id number] Visit Date: 06/19/2015  Today's Provider: Lelon Huh, MD   Chief Complaint  Patient presents with  . Hypertension    follow up   Subjective:    HPI   Hypertension, follow-up:  BP Readings from Last 3 Encounters:  06/15/15 178/90  06/13/15 181/89  05/24/15 172/98    She was last seen for hypertension 4 days ago.  BP at that visit was 194/82. Management at that visit includes doubling Lisinopril to 2x10mg  daily and doubling HCTZ to 2x 12.5mg  2 tablets daily,   The day before that visit she called with headache and very high blood pressure and was advised to double dose of Bystolic to 2x10mg  daily She states BP remained very high for about 2 days, but started coming down quite a bit yesterday and today. Her headache is much better.She is still fatigued and having a lot of fibromyalgia pain since she also stopped Celebrex.     Outside blood pressures are 137/87 today  Patient denies chest pain, chest pressure/discomfort, claudication, dyspnea, exertional chest pressure/discomfort, fatigue, irregular heart beat and lower extremity edema.       Weight trend: stable Wt Readings from Last 3 Encounters:  05/24/15 185 lb (83.915 kg)  04/15/15 184 lb (83.462 kg)  04/03/15 184 lb (83.462 kg)    Current diet: in general, a "healthy" diet    ------------------------------------------------------------------------       Allergies  Allergen Reactions  . Cephalexin Anaphylaxis  . Atorvastatin   . Statins Other (See Comments)    Muscle pain  . Penicillins Hives and Rash   Previous Medications   ALBUTEROL (PROVENTIL HFA;VENTOLIN HFA) 108 (90 BASE) MCG/ACT INHALER    Inhale into the lungs.   AMLODIPINE (NORVASC) 2.5 MG TABLET    Take 1 tablet (2.5 mg total) by mouth daily.   ASPIRIN 81 MG TABLET    ASPIRIN, 81MG  (Oral Tablet Delayed Release)  1 Every Day for 0 days  Quantity: 0.00;   Refills: 0   Ordered :17-November-2010  [name] ;  Started 05-Oct-2006 Active Comments: DX: 401.9   CELECOXIB (CELEBREX) 200 MG CAPSULE    Take 1 capsule (200 mg total) by mouth daily.   DIAZEPAM (VALIUM) 10 MG TABLET    TAKE 1 TABLET BY MOUTH EVERY DAY   EPIPEN 2-PAK 0.3 MG/0.3ML SOAJ INJECTION    INJECT AS DIRECTED FOR SEVERE ALLERGIC REACTION   FLUTICASONE (FLONASE) 50 MCG/ACT NASAL SPRAY    Place 2 sprays into both nostrils daily.   GABAPENTIN (NEURONTIN) 300 MG CAPSULE    Take 1 capsule (300 mg total) by mouth 3 (three) times daily.   HYDROCHLOROTHIAZIDE (HYDRODIURIL) 12.5 MG TABLET    Take 2 tablets (25 mg total) by mouth daily.   LEVOTHYROXINE (SYNTHROID, LEVOTHROID) 88 MCG TABLET    TAKE 1 TABLET EVERY DAY   LISINOPRIL (PRINIVIL,ZESTRIL) 10 MG TABLET    Take 2 tablets (20 mg total) by mouth daily.   NEBIVOLOL (BYSTOLIC) 10 MG TABLET    Take 2 tablets (20 mg total) by mouth daily.   TACROLIMUS (PROTOPIC) 0.1 % OINTMENT    TACROLIMUS, 0.1% (External Ointment) - Historical Medication  two times daily (0.1 %) Active   ZOLPIDEM (AMBIEN) 10 MG TABLET    Take 1 tablet (10 mg total) by mouth at bedtime as needed.    Review of Systems  Constitutional: Negative for  fever, chills, appetite change and fatigue.  Respiratory: Negative for chest tightness and shortness of breath.   Cardiovascular: Negative for chest pain and palpitations.  Gastrointestinal: Positive for nausea. Negative for vomiting and abdominal pain.  Neurological: Positive for headaches. Negative for dizziness and weakness.    Social History  Substance Use Topics  . Smoking status: Former Research scientist (life sciences)  . Smokeless tobacco: Never Used     Comment: quit 45  years ago   . Alcohol Use: No   Objective:   BP 150/80 mmHg  Pulse 53  Temp(Src) 98.9 F (37.2 C) (Oral)  Resp 16  Wt 184 lb (83.462 kg)  SpO2 95%  Physical Exam   General Appearance:    Alert, cooperative, no distress  Eyes:    PERRL, conjunctiva/corneas clear,  EOM's intact       Lungs:     Clear to auscultation bilaterally, respirations unlabored  Heart:    Regular rate and rhythm  Neurologic:   Awake, alert, oriented x 3. No apparent focal neurological           defect.       Results for orders placed or performed in visit on 06/19/15  POCT urinalysis dipstick  Result Value Ref Range   Color, UA Yellow    Clarity, UA Clear    Glucose, UA Negative    Bilirubin, UA Negative    Ketones, UA Netative    Spec Grav, UA 1.010    Blood, UA Trace    pH, UA 6.0    Protein, UA Negative    Urobilinogen, UA 0.2    Nitrite, UA Negative    Leukocytes, UA Negative Negative       Assessment & Plan:     1. Essential hypertension Much better since double bystolic, hctz, and lisinopril and seems to be tolerating medications well  Will change to combination lisinopril-hctz at same total daily dosage for convenience. She wants to finish off current bottle of Bystolic by take 2x10mg  per day since it is much more expensive. If BP remains well controlled she can try starting back on Celebrex, but advised to to stop if it pushes BP above 160/90.   - POCT urinalysis dipstick - Comprehensive metabolic panel - lisinopril-hydrochlorothiazide (PRINZIDE,ZESTORETIC) 20-25 MG tablet; Take 1 tablet by mouth daily.  Dispense: 90 tablet; Refill: 1 - Nebivolol HCl 20 MG TABS; Take 1 tablet (20 mg total) by mouth daily.  Dispense: 90 tablet; Refill: 1  2. Hypothyroidism, unspecified hypothyroidism type  - T4 AND TSH  Follow up Dr. Venia Minks first week of February as already scheduled.        Lelon Huh, MD  [facility]

## 2015-06-19 NOTE — Patient Instructions (Addendum)
   You can stop taking the 10mg  lisinopril tablets, and the 12.5mg  hctz tablets  In their place, start taking lisinopril-hctz 20/25mg  tablets once a day  Continue taking 2 Bystolic 10mg  tablets until they are gone, than take 1 20mg  tablet daily  Continue taking 1 amlodipine 2.5mg  tablets every day

## 2015-06-21 DIAGNOSIS — E039 Hypothyroidism, unspecified: Secondary | ICD-10-CM | POA: Diagnosis not present

## 2015-06-21 DIAGNOSIS — I10 Essential (primary) hypertension: Secondary | ICD-10-CM | POA: Diagnosis not present

## 2015-06-22 LAB — COMPREHENSIVE METABOLIC PANEL
ALBUMIN: 4.4 g/dL (ref 3.5–4.8)
ALK PHOS: 68 IU/L (ref 39–117)
ALT: 10 IU/L (ref 0–32)
AST: 16 IU/L (ref 0–40)
Albumin/Globulin Ratio: 2 (ref 1.1–2.5)
BILIRUBIN TOTAL: 0.5 mg/dL (ref 0.0–1.2)
BUN / CREAT RATIO: 20 (ref 11–26)
BUN: 18 mg/dL (ref 8–27)
CHLORIDE: 85 mmol/L — AB (ref 96–106)
CO2: 26 mmol/L (ref 18–29)
Calcium: 9.2 mg/dL (ref 8.7–10.3)
Creatinine, Ser: 0.91 mg/dL (ref 0.57–1.00)
GFR calc Af Amer: 73 mL/min/{1.73_m2} (ref >59)
GFR calc non Af Amer: 63 mL/min/{1.73_m2} (ref >59)
GLUCOSE: 103 mg/dL — AB (ref 65–99)
Globulin, Total: 2.2 g/dL (ref 1.5–4.5)
Potassium: 4.8 mmol/L (ref 3.5–5.2)
Sodium: 127 mmol/L — ABNORMAL LOW (ref 134–144)
Total Protein: 6.6 g/dL (ref 6.0–8.5)

## 2015-06-22 LAB — T4 AND TSH
T4, Total: 9.9 ug/dL (ref 4.5–12.0)
TSH: 1.45 u[IU]/mL (ref 0.450–4.500)

## 2015-06-24 ENCOUNTER — Telehealth: Payer: Self-pay | Admitting: *Deleted

## 2015-06-24 MED ORDER — LISINOPRIL 20 MG PO TABS: 20.0000 mg | ORAL_TABLET | Freq: Every day | ORAL | Status: DC

## 2015-06-24 MED ORDER — AMLODIPINE BESYLATE 5 MG PO TABS: 5.0000 mg | ORAL_TABLET | Freq: Every day | ORAL | Status: DC

## 2015-06-24 NOTE — Telephone Encounter (Signed)
Patient was notified of results. Patient expressed understanding. Rx's sent to pharmacy.  

## 2015-06-24 NOTE — Telephone Encounter (Signed)
-----   Message from Birdie Sons, MD sent at 06/22/2015 10:03 AM EST -----   ----- Message -----    From: Birdie Sons, MD    Sent: 06/22/2015  10:02 AM      To: [facility] Nurse  Patient's sodium is getting very low. Needs to STOP lisinopril-hctz. Start lisinopril 20mg  once a day, #30, rf x 1, and increase amlodipine to 5mg  daily, #30, rf x 1. Continue current dose of Bystolic. Otherwise labs are normal.

## 2015-06-27 ENCOUNTER — Telehealth: Payer: Self-pay | Admitting: *Deleted

## 2015-06-27 ENCOUNTER — Encounter: Payer: Self-pay | Admitting: Podiatry

## 2015-06-27 ENCOUNTER — Ambulatory Visit (INDEPENDENT_AMBULATORY_CARE_PROVIDER_SITE_OTHER): Payer: Medicare Other | Admitting: Podiatry

## 2015-06-27 VITALS — BP 154/86 | HR 86 | Resp 16

## 2015-06-27 DIAGNOSIS — M799 Soft tissue disorder, unspecified: Secondary | ICD-10-CM

## 2015-06-27 DIAGNOSIS — M7989 Other specified soft tissue disorders: Secondary | ICD-10-CM

## 2015-06-27 NOTE — Progress Notes (Signed)
Patient ID: HIEDI TALLUTO, female   DOB: [birth year], 75 y.o.   MRN: [id number]  Subjective: 75 year old female presents the office today. Evaluation of possible cyst, soft tissue mass to the top of her right foot along the plate toe joint. She states the area does feel better since last appointment although does continue to throb at times. Denies any swelling or redness over one area. No tenderness. Denies any systemic complaints such as fevers, chills, nausea, vomiting. No acute changes since last appointment, and no other complaints at this time.   Objective: AAO x3, NAD DP/PT pulses palpable bilaterally, CRT less than 3 seconds Protective sensation intact with Simms Weinstein monofilament On the dorsal aspect of the right first MTPJ there is a lobulated soft tissue fluid-filled mass which appears to be on the course the extensor tendon along the joint into the distal first metatarsal. There is mild discomfort upon palpation to this area. There is no upon erythema or increase in warmth. There is slight discomfort with first MTPJ range of motion. There is no other areas of tenderness to bilateral lower extremity is. No areas of pinpoint bony tenderness or pain with vibratory sensation. MMT 5/5, ROM WNL. No edema, erythema, increase in warmth to bilateral lower extremities.  No open lesions or pre-ulcerative lesions.  No pain with calf compression, swelling, warmth, erythema  Assessment: Soft tissue mass present right dorsal first MTPJ along the extensor tendon.  Plan: -All treatment options discussed with the patient including all alternatives, risks, complications.  -I discussed the fluid analysis with the patient. I do believe this is not an infection. Due to continued pain and a continuation the soft tissue mass will obtain an MRI to further evaluate the area. Discussed the surgical excision pending MRI. Given her pain with first MTPJ range of motion I'm concerned the mass may be extending  deep into the first interspace into the joint. We'll obtain the MRI and discuss further treatment options pending the MRI. -Follow-up after MRI or sooner if any problems arise. In the meantime, encouraged to call the office with any questions, concerns, change in symptoms.   Celesta Gentile, DPM

## 2015-06-27 NOTE — Telephone Encounter (Addendum)
-----   Message from Trula Slade, DPM sent at 06/27/2015  4:14 PM EST ----- Can you please order an MRI of the right foot. She has a lobulated soft tissue mass on the dorsal 1st MTPJ along the extensor tendon. Faxed orders and pt demographics to [facility].  07/22/2015-[place] - [facility] ASKED IF could change MRI of right foot to with and without contrast.  Dr. Jacqualyn Posey states yes if can.  07/23/2015-INFORMED [place] - [facility], Dr. Jacqualyn Posey wanted pt to have MRI with and without contrast. 07/24/2015-INFORMED PT THAT DR. Jacqualyn Posey reviewed her MRI and it showed small ganglion cysts, and I transferred to schedule an appt.

## 2015-06-30 ENCOUNTER — Other Ambulatory Visit: Payer: Self-pay | Admitting: Family Medicine

## 2015-06-30 DIAGNOSIS — F32A Depression, unspecified: Secondary | ICD-10-CM

## 2015-06-30 DIAGNOSIS — F329 Major depressive disorder, single episode, unspecified: Secondary | ICD-10-CM

## 2015-07-01 NOTE — Telephone Encounter (Signed)
Printed, please fax or call in to pharmacy. Thank you.   

## 2015-07-02 ENCOUNTER — Other Ambulatory Visit: Payer: Self-pay | Admitting: Family Medicine

## 2015-07-02 DIAGNOSIS — M158 Other polyosteoarthritis: Secondary | ICD-10-CM

## 2015-07-04 ENCOUNTER — Encounter: Payer: Self-pay | Admitting: Family Medicine

## 2015-07-04 ENCOUNTER — Ambulatory Visit (INDEPENDENT_AMBULATORY_CARE_PROVIDER_SITE_OTHER): Payer: Medicare Other | Admitting: Family Medicine

## 2015-07-04 ENCOUNTER — Ambulatory Visit: Payer: Medicare Other | Admitting: Family Medicine

## 2015-07-04 VITALS — BP 130/66 | HR 68 | Temp 98.4°F | Resp 16 | Wt 185.0 lb

## 2015-07-04 DIAGNOSIS — M546 Pain in thoracic spine: Secondary | ICD-10-CM

## 2015-07-04 DIAGNOSIS — E871 Hypo-osmolality and hyponatremia: Secondary | ICD-10-CM

## 2015-07-04 DIAGNOSIS — F329 Major depressive disorder, single episode, unspecified: Secondary | ICD-10-CM | POA: Diagnosis not present

## 2015-07-04 DIAGNOSIS — I10 Essential (primary) hypertension: Secondary | ICD-10-CM

## 2015-07-04 DIAGNOSIS — F32A Depression, unspecified: Secondary | ICD-10-CM

## 2015-07-04 DIAGNOSIS — M549 Dorsalgia, unspecified: Secondary | ICD-10-CM

## 2015-07-04 DIAGNOSIS — E870 Hyperosmolality and hypernatremia: Secondary | ICD-10-CM | POA: Insufficient documentation

## 2015-07-04 MED ORDER — AMLODIPINE BESYLATE 5 MG PO TABS: 5.0000 mg | ORAL_TABLET | Freq: Every day | ORAL | Status: DC

## 2015-07-04 NOTE — Progress Notes (Signed)
Subjective:    Patient ID: Stacey [name], female    DOB: Dec 02, 1940, 75 y.o.   MRN: [id number]  Hypertension This is a chronic (Pt last seen on 06/19/2015 for HTN. Started pt on Lisinopril-HCTZ. Pt advised to D/C Celebrex if BP >160/90. Labs were checked. Sodium was low at 127. D/C Lisinopril-HCTZ and started Lisinopril 20 mg, increased Amlodipine to 5 mg, and continue Bystolic.) problem. The problem has been gradually improving (BP is 130/66 in office today) since onset. The problem is controlled. Associated symptoms include anxiety, blurred vision (fibromyalgia), chest pain ("anxiety attack"), headaches, malaise/fatigue and neck pain (fibromyalgia). Pertinent negatives include no palpitations, peripheral edema or shortness of breath. Risk factors for coronary artery disease include dyslipidemia, stress and family history. Treatments tried: Bystolic 20 mg, Lisinopril 20 mg, Amlodipine 5 mg. The current treatment provides moderate improvement. There are no compliance problems.     Review of Systems  Constitutional: Positive for malaise/fatigue.  Eyes: Positive for blurred vision (fibromyalgia).  Respiratory: Negative for shortness of breath.   Cardiovascular: Positive for chest pain ("anxiety attack"). Negative for palpitations.  Musculoskeletal: Positive for neck pain (fibromyalgia).  Neurological: Positive for headaches.   BP 130/66 mmHg  Pulse 68  Temp(Src) 98.4 F (36.9 C) (Oral)  Resp 16  Wt 185 lb (83.915 kg)   Patient Active Problem List   Diagnosis Date Noted  . Other osteoarthritis involving multiple joints 02/06/2015  . Adult BMI 30+ 09/27/2014  . Clinical depression 09/27/2014  . Dry mouth 09/27/2014  . Accumulation of fluid in tissues 09/27/2014  . Fibrositis 09/27/2014  . Cephalalgia 09/27/2014  . LBP (low back pain) 09/27/2014  . Screening for osteoporosis 09/27/2014  . Hyperthyroidism, subclinical 09/27/2014  . Avitaminosis D 09/27/2014  . Decreased  leukocytes 09/27/2014  . History of colon polyps 11/14/2009  . Abnormal blood sugar 05/03/2009  . Cannot sleep 09/29/2007  . BP (high blood pressure) 03/09/2007  . Adaptation reaction 01/29/2007  . Acid reflux 10/07/2006  . Menopausal and postmenopausal disorder 10/05/2006  . Headache, migraine 10/05/2006  . Arthritis, degenerative 03/23/1994  . Adult hypothyroidism 04/15/1993  . Hypercholesteremia 03/25/1993   Past Medical History  Diagnosis Date  . Cancer ([place])   . Glaucoma   . Thyroid disease   . Hypertension   . Depression    Current Outpatient Prescriptions on File Prior to Visit  Medication Sig  . albuterol (PROVENTIL HFA;VENTOLIN HFA) 108 (90 BASE) MCG/ACT inhaler Inhale into the lungs.  Marland Kitchen amLODipine (NORVASC) 5 MG tablet Take 1 tablet (5 mg total) by mouth daily.  Marland Kitchen aspirin 81 MG tablet ASPIRIN, 81MG  (Oral Tablet Delayed Release)  1 Every Day for 0 days  Quantity: 0.00;  Refills: 0   Ordered :17-November-2010  [name] ;  Started 05-Oct-2006 Active Comments: DX: 401.9  . celecoxib (CELEBREX) 200 MG capsule Take 1 capsule (200 mg total) by mouth daily.  . diazepam (VALIUM) 10 MG tablet TAKE 1 TABLET EVERY DAY  . EPIPEN 2-PAK 0.3 MG/0.3ML SOAJ injection INJECT AS DIRECTED FOR SEVERE ALLERGIC REACTION  . fluticasone (FLONASE) 50 MCG/ACT nasal spray Place 2 sprays into both nostrils daily.  Marland Kitchen gabapentin (NEURONTIN) 300 MG capsule Take 1 capsule (300 mg total) by mouth 3 (three) times daily.  Marland Kitchen levothyroxine (SYNTHROID, LEVOTHROID) 88 MCG tablet TAKE 1 TABLET EVERY DAY  . lisinopril (PRINIVIL,ZESTRIL) 20 MG tablet Take 1 tablet (20 mg total) by mouth daily.  . Nebivolol HCl 20 MG TABS Take 1 tablet (20 mg total) by  mouth daily.  . tacrolimus (PROTOPIC) 0.1 % ointment TACROLIMUS, 0.1% (External Ointment) - Historical Medication  two times daily (0.1 %) Active  . zolpidem (AMBIEN) 10 MG tablet Take 1 tablet (10 mg total) by mouth at bedtime as needed.   No current  facility-administered medications on file prior to visit.   Allergies  Allergen Reactions  . Cephalexin Anaphylaxis  . Atorvastatin   . Statins Other (See Comments)    Muscle pain  . Penicillins Hives and Rash   Past Surgical History  Procedure Laterality Date  . Abdominal hysterectomy    . Nasal sinus surgery    . Right oophorectomy    . Appendectomy    . Bladder surgery     Social History   Social History  . Marital Status: Married    Spouse Name: [name]  . Number of Children: 3  . Years of Education: N/A   Occupational History  . Not on file.   Social History Main Topics  . Smoking status: Former Research scientist (life sciences)  . Smokeless tobacco: Never Used     Comment: quit 45  years ago   . Alcohol Use: No  . Drug Use: No  . Sexual Activity: Not on file   Other Topics Concern  . Not on file   Social History Narrative   Family History  Problem Relation Age of Onset  . Alzheimer's disease Mother   . Kidney cancer Mother   . Heart attack Father   . Pancreatic cancer Sister   . Heart attack Brother 43  . Ovarian cancer Sister   . Kidney disease Sister     dialysis  . Cancer Sister     uterin  . Heart attack Brother 56      Objective:   Physical Exam  Constitutional: She is oriented to person, place, and time. She appears well-developed and well-nourished.  Cardiovascular: Normal rate and regular rhythm.   Pulmonary/Chest: Effort normal and breath sounds normal.  Neurological: She is alert and oriented to person, place, and time.  Psychiatric: She has a normal mood and affect. Her behavior is normal. Judgment and thought content normal.   BP 130/66 mmHg  Pulse 68  Temp(Src) 98.4 F (36.9 C) (Oral)  Resp 16  Wt 185 lb (83.915 kg)      Assessment & Plan:  1. Essential hypertension Condition is stable. Please continue current medication and  plan of care as noted.  Do not think her home monitor is accurate. Will bring monitor in to be checked.   - amLODipine (NORVASC)  5 MG tablet; Take 1 tablet (5 mg total) by mouth daily.  Dispense: 30 tablet; Refill: 1  2. Hyponatremia Recheck labs.  - Comprehensive metabolic panel  3. Upper back pain Will refer to Dr.Chasnis to see if can help pain in different fashion.  Medical management has not helped. Has pain everywhere, but most symptomatic here currently.   - Ambulatory referral to Orthopedics  4. Clinical depression Stable. Really related to pain.   Patient was seen and examined by Jerrell Belfast, MD, and note scribed by Renaldo Fiddler, CMA. I have reviewed the document for accuracy and completeness and I agree with above. Jerrell Belfast, MD  Margarita Rana, MD

## 2015-07-05 ENCOUNTER — Telehealth: Payer: Self-pay | Admitting: Family Medicine

## 2015-07-05 LAB — COMPREHENSIVE METABOLIC PANEL
ALT: 12 IU/L (ref 0–32)
AST: 13 IU/L (ref 0–40)
Albumin/Globulin Ratio: 2.2 (ref 1.1–2.5)
Albumin: 4.4 g/dL (ref 3.5–4.8)
Alkaline Phosphatase: 61 IU/L (ref 39–117)
BUN/Creatinine Ratio: 19 (ref 11–26)
BUN: 14 mg/dL (ref 8–27)
Bilirubin Total: 0.3 mg/dL (ref 0.0–1.2)
CALCIUM: 9.3 mg/dL (ref 8.7–10.3)
CHLORIDE: 86 mmol/L — AB (ref 96–106)
CO2: 28 mmol/L (ref 18–29)
CREATININE: 0.73 mg/dL (ref 0.57–1.00)
GFR calc Af Amer: 95 mL/min/{1.73_m2} (ref >59)
GFR, EST NON AFRICAN AMERICAN: 83 mL/min/{1.73_m2} (ref >59)
GLUCOSE: 93 mg/dL (ref 65–99)
Globulin, Total: 2 g/dL (ref 1.5–4.5)
Potassium: 4.4 mmol/L (ref 3.5–5.2)
Sodium: 129 mmol/L — ABNORMAL LOW (ref 134–144)
TOTAL PROTEIN: 6.4 g/dL (ref 6.0–8.5)

## 2015-07-05 NOTE — Telephone Encounter (Signed)
Med list printed. Patient advised.

## 2015-07-05 NOTE — Telephone Encounter (Signed)
Pt would like to get an updated list of her medications. Pt would like to get it today if possible. Thanks TNP

## 2015-07-23 ENCOUNTER — Ambulatory Visit
Admission: RE | Admit: 2015-07-23 | Discharge: 2015-07-23 | Disposition: A | Discharge status: Home / Self Care | Payer: Medicare Other | Source: Ambulatory Visit | Attending: Podiatry | Admitting: Podiatry

## 2015-07-23 DIAGNOSIS — M799 Soft tissue disorder, unspecified: Secondary | ICD-10-CM | POA: Insufficient documentation

## 2015-07-23 DIAGNOSIS — R938 Abnormal findings on diagnostic imaging of other specified body structures: Secondary | ICD-10-CM | POA: Insufficient documentation

## 2015-07-23 DIAGNOSIS — M19071 Primary osteoarthritis, right ankle and foot: Secondary | ICD-10-CM | POA: Diagnosis not present

## 2015-07-23 DIAGNOSIS — M7989 Other specified soft tissue disorders: Secondary | ICD-10-CM

## 2015-07-23 MED ORDER — GADOBENATE DIMEGLUMINE 529 MG/ML IV SOLN
20.0000 mL | Freq: Once | INTRAVENOUS | Status: AC | PRN
  Administered 2015-07-23: 17 mL via INTRAVENOUS

## 2015-07-24 NOTE — Telephone Encounter (Signed)
-----   Message from Trula Slade, DPM sent at 07/24/2015 11:16 AM EST ----- Can you let her know that the MRI shoes small ganglion cysts. Can schedule follow-up.

## 2015-08-07 DIAGNOSIS — M5136 Other intervertebral disc degeneration, lumbar region: Secondary | ICD-10-CM | POA: Diagnosis not present

## 2015-08-07 DIAGNOSIS — M797 Fibromyalgia: Secondary | ICD-10-CM | POA: Diagnosis not present

## 2015-08-07 DIAGNOSIS — M62830 Muscle spasm of back: Secondary | ICD-10-CM | POA: Diagnosis not present

## 2015-08-07 DIAGNOSIS — M5030 Other cervical disc degeneration, unspecified cervical region: Secondary | ICD-10-CM | POA: Diagnosis not present

## 2015-08-07 DIAGNOSIS — R200 Anesthesia of skin: Secondary | ICD-10-CM | POA: Diagnosis not present

## 2015-08-15 ENCOUNTER — Ambulatory Visit (INDEPENDENT_AMBULATORY_CARE_PROVIDER_SITE_OTHER): Payer: Medicare Other | Admitting: Podiatry

## 2015-08-15 ENCOUNTER — Encounter: Payer: Self-pay | Admitting: Podiatry

## 2015-08-15 VITALS — BP 165/76 | HR 73 | Resp 18

## 2015-08-15 DIAGNOSIS — M19071 Primary osteoarthritis, right ankle and foot: Secondary | ICD-10-CM

## 2015-08-15 DIAGNOSIS — M799 Soft tissue disorder, unspecified: Secondary | ICD-10-CM | POA: Diagnosis not present

## 2015-08-15 DIAGNOSIS — M6740 Ganglion, unspecified site: Secondary | ICD-10-CM | POA: Diagnosis not present

## 2015-08-15 DIAGNOSIS — M7989 Other specified soft tissue disorders: Secondary | ICD-10-CM

## 2015-08-16 DIAGNOSIS — M19079 Primary osteoarthritis, unspecified ankle and foot: Secondary | ICD-10-CM | POA: Insufficient documentation

## 2015-08-16 DIAGNOSIS — M6740 Ganglion, unspecified site: Secondary | ICD-10-CM | POA: Insufficient documentation

## 2015-08-16 NOTE — Progress Notes (Signed)
Patient ID: Stacey [name], female   DOB: 1940/06/20, 75 y.o.   MRN: [id number]  Subjective: 75 year old female presents the office in follow-up evaluation of right foot pain along the dorsal aspect of the first MTPJ first mile cysts. She presents to discuss MRI results as well. No acute changes his last clinic. She is a very does continue to become painful intermittently. Denies any redness or warmth around the area. No tingling or numbness. Denies any systemic complaints such as fevers, chills, nausea, vomiting. No acute changes since last appointment, and no other complaints at this time.   Objective: AAO x3, NAD DP/PT pulses palpable bilaterally, CRT less than 3 seconds Protective sensation intact with Simms Weinstein monofilament There is some slight discomfort with first MTPJ range of motion however the majority of her symptoms appear to be localized over fluid-filled softs mobile soft tissue masses along the dorsal lateral aspect of the first MTPJ, first interspace. This is just adjacent to the extensor tendon. These. The ganglion cyst. There is no overlying erythema or increase in warmth. No other areas of tenderness to bilateral lower extremities. No areas of pinpoint bony tenderness or pain with vibratory sensation. MMT 5/5, ROM WNL. No edema, erythema, increase in warmth to bilateral lower extremities.  No open lesions or pre-ulcerative lesions.  No pain with calf compression, swelling, warmth, erythema  Assessment: Right foot ganglion cyst, osteoarthritis  Plan: -All treatment options discussed with the patient including all alternatives, risks, complications.  -MRI results were discussed the patient was sent reveal likely ganglion cyst and moderate degenerative changes of the first MTPJ. No stress fracture or AVN. -As I discussed aspiration of the area be amenable bubble aspirate much fluid. Discussed steroid injection and she would proceed with this today. Discussed the potential  risks and complications. Under sterile conditions does wasn't also was infiltrated into the fluid-filled mass without complications. Post injection care was discussed. -If the mass grows will likely drain. -Follow-up as scheduled. -Patient encouraged to call the office with any questions, concerns, change in symptoms.   Celesta Gentile, DPM

## 2015-08-31 ENCOUNTER — Other Ambulatory Visit: Payer: Self-pay | Admitting: Family Medicine

## 2015-09-06 DIAGNOSIS — S61202A Unspecified open wound of right middle finger without damage to nail, initial encounter: Secondary | ICD-10-CM | POA: Diagnosis not present

## 2015-09-11 ENCOUNTER — Ambulatory Visit (INDEPENDENT_AMBULATORY_CARE_PROVIDER_SITE_OTHER): Payer: Medicare Other | Admitting: Family Medicine

## 2015-09-11 ENCOUNTER — Encounter: Payer: Self-pay | Admitting: Family Medicine

## 2015-09-11 VITALS — BP 162/80 | HR 72 | Temp 98.2°F | Resp 16 | Wt 185.0 lb

## 2015-09-11 DIAGNOSIS — R55 Syncope and collapse: Secondary | ICD-10-CM

## 2015-09-11 DIAGNOSIS — R0789 Other chest pain: Secondary | ICD-10-CM | POA: Diagnosis not present

## 2015-09-11 DIAGNOSIS — F410 Panic disorder [episodic paroxysmal anxiety] without agoraphobia: Secondary | ICD-10-CM | POA: Diagnosis not present

## 2015-09-11 DIAGNOSIS — I10 Essential (primary) hypertension: Secondary | ICD-10-CM | POA: Diagnosis not present

## 2015-09-11 MED ORDER — ESCITALOPRAM OXALATE 10 MG PO TABS: 10.0000 mg | ORAL_TABLET | Freq: Every day | ORAL | Status: DC

## 2015-09-11 MED ORDER — LISINOPRIL 40 MG PO TABS: 40.0000 mg | ORAL_TABLET | Freq: Every day | ORAL | Status: DC
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
## 2015-09-16 DIAGNOSIS — E782 Mixed hyperlipidemia: Secondary | ICD-10-CM | POA: Insufficient documentation

## 2015-09-16 DIAGNOSIS — R42 Dizziness and giddiness: Secondary | ICD-10-CM | POA: Insufficient documentation

## 2015-09-16 DIAGNOSIS — I10 Essential (primary) hypertension: Secondary | ICD-10-CM | POA: Diagnosis not present

## 2015-09-16 DIAGNOSIS — R079 Chest pain, unspecified: Secondary | ICD-10-CM | POA: Insufficient documentation

## 2015-09-16 DIAGNOSIS — I208 Other forms of angina pectoris: Secondary | ICD-10-CM | POA: Diagnosis not present

## 2015-09-17 ENCOUNTER — Telehealth: Payer: Self-pay

## 2015-09-17 ENCOUNTER — Encounter: Payer: Self-pay | Admitting: Podiatry

## 2015-09-17 ENCOUNTER — Ambulatory Visit (INDEPENDENT_AMBULATORY_CARE_PROVIDER_SITE_OTHER): Payer: Medicare Other | Admitting: Podiatry

## 2015-09-17 VITALS — BP 173/94 | HR 59 | Resp 18

## 2015-09-17 DIAGNOSIS — M6740 Ganglion, unspecified site: Secondary | ICD-10-CM

## 2015-09-17 LAB — COMPREHENSIVE METABOLIC PANEL
A/G RATIO: 1.9 (ref 1.2–2.2)
ALT: 9 IU/L (ref 0–32)
AST: 13 IU/L (ref 0–40)
Albumin: 4.2 g/dL (ref 3.5–4.8)
Alkaline Phosphatase: 69 IU/L (ref 39–117)
BILIRUBIN TOTAL: 0.4 mg/dL (ref 0.0–1.2)
BUN/Creatinine Ratio: 23 (ref 12–28)
BUN: 18 mg/dL (ref 8–27)
CHLORIDE: 95 mmol/L — AB (ref 96–106)
CO2: 27 mmol/L (ref 18–29)
Calcium: 9.4 mg/dL (ref 8.7–10.3)
Creatinine, Ser: 0.77 mg/dL (ref 0.57–1.00)
GFR calc Af Amer: 89 mL/min/{1.73_m2} (ref >59)
GFR calc non Af Amer: 77 mL/min/{1.73_m2} (ref >59)
GLOBULIN, TOTAL: 2.2 g/dL (ref 1.5–4.5)
Glucose: 116 mg/dL — ABNORMAL HIGH (ref 65–99)
POTASSIUM: 4.6 mmol/L (ref 3.5–5.2)
SODIUM: 138 mmol/L (ref 134–144)
Total Protein: 6.4 g/dL (ref 6.0–8.5)

## 2015-09-17 NOTE — Telephone Encounter (Signed)
lmtcb Stacey [name], CMA  

## 2015-09-17 NOTE — Telephone Encounter (Signed)
Advised patient of results.  

## 2015-09-17 NOTE — Telephone Encounter (Signed)
Pt returned your call.   ° °Thanks, teri °

## 2015-09-17 NOTE — Telephone Encounter (Signed)
-----   Message from Margarita Rana, MD sent at 09/17/2015  7:59 AM EDT ----- Labs stable  Please notify patient. Thanks.

## 2015-09-19 NOTE — Progress Notes (Signed)
Patient ID: KANEKA BRONSON, female   DOB: 1940/12/20, 75 y.o.   MRN: [id number]  Subjective: Patient presents today for follow-up evaluation of systems top of the right foot which has gotten somewhat bigger. It causes irritation shoes and get somewhat red due to irritation. No recent injury or trauma. Denies any systemic complaints such as fevers, chills, nausea, vomiting. No acute changes since last appointment, and no other complaints at this time.   Objective: AAO x3, NAD DP/PT pulses palpable bilaterally, CRT less than 3 seconds Fluid-filled soft tissue mass present on the dorsal first MTPJ and on the first interspace of the right foot. There is no overlying erythema or skin change. There is mild tenderness overlying the cyst. No pain with MPJ range of motion. No other areas of tenderness bilaterally. nsation. MMT 5/5, ROM WNL. No edema, erythema, increase in warmth to bilateral lower extremities.  No open lesions or pre-ulcerative lesions.  No pain with calf compression, swelling, warmth, erythema  Assessment: Ganglion cyst likely right foot   Plan: -All treatment options discussed with the patient including all alternatives, risks, complications.  -Discussed aspiration of the cyst. She wishes to proceed. The cyst was drained with an 18-gauge needle under sterile conditions. Small amount of clear gel-like fluid was expressed. Dexamethasone phosphate was infiltrated. Compression bandage was applied.  -Follow up of symptoms recur or the symptoms do not improve in the next 3-4 weeks.  -Patient encouraged to call the office with any questions, concerns, change in symptoms.   Celesta Gentile, DPM

## 2015-09-23 DIAGNOSIS — R42 Dizziness and giddiness: Secondary | ICD-10-CM | POA: Diagnosis not present

## 2015-09-26 ENCOUNTER — Other Ambulatory Visit: Payer: Self-pay | Admitting: Family Medicine

## 2015-09-26 DIAGNOSIS — I10 Essential (primary) hypertension: Secondary | ICD-10-CM

## 2015-09-27 ENCOUNTER — Other Ambulatory Visit: Payer: Self-pay | Admitting: Family Medicine

## 2015-09-27 DIAGNOSIS — I10 Essential (primary) hypertension: Secondary | ICD-10-CM

## 2015-09-30 ENCOUNTER — Other Ambulatory Visit: Payer: Self-pay | Admitting: Family Medicine

## 2015-09-30 DIAGNOSIS — G4700 Insomnia, unspecified: Secondary | ICD-10-CM

## 2015-09-30 NOTE — Telephone Encounter (Signed)
Printed, please fax or call in to pharmacy. Thank you.   

## 2015-10-05 ENCOUNTER — Other Ambulatory Visit: Payer: Self-pay | Admitting: Family Medicine

## 2015-10-07 DIAGNOSIS — J209 Acute bronchitis, unspecified: Secondary | ICD-10-CM | POA: Diagnosis not present

## 2015-10-09 ENCOUNTER — Ambulatory Visit: Payer: Medicare Other | Admitting: Family Medicine

## 2015-10-12 ENCOUNTER — Other Ambulatory Visit: Payer: Self-pay | Admitting: Family Medicine

## 2015-10-12 DIAGNOSIS — F329 Major depressive disorder, single episode, unspecified: Secondary | ICD-10-CM

## 2015-10-12 DIAGNOSIS — F32A Depression, unspecified: Secondary | ICD-10-CM

## 2015-10-12 DIAGNOSIS — I10 Essential (primary) hypertension: Secondary | ICD-10-CM

## 2015-10-15 ENCOUNTER — Other Ambulatory Visit: Payer: Self-pay | Admitting: Family Medicine

## 2015-10-15 DIAGNOSIS — F32A Depression, unspecified: Secondary | ICD-10-CM

## 2015-10-15 DIAGNOSIS — F329 Major depressive disorder, single episode, unspecified: Secondary | ICD-10-CM

## 2015-10-18 ENCOUNTER — Encounter: Payer: Self-pay | Admitting: Family Medicine

## 2015-10-18 ENCOUNTER — Ambulatory Visit (INDEPENDENT_AMBULATORY_CARE_PROVIDER_SITE_OTHER): Payer: Medicare Other | Admitting: Family Medicine

## 2015-10-18 VITALS — BP 138/80 | HR 68 | Temp 98.6°F | Resp 16 | Wt 184.0 lb

## 2015-10-18 DIAGNOSIS — F410 Panic disorder [episodic paroxysmal anxiety] without agoraphobia: Secondary | ICD-10-CM | POA: Diagnosis not present

## 2015-10-18 DIAGNOSIS — I10 Essential (primary) hypertension: Secondary | ICD-10-CM | POA: Diagnosis not present

## 2015-10-18 DIAGNOSIS — G4700 Insomnia, unspecified: Secondary | ICD-10-CM

## 2015-10-18 MED ORDER — ZOLPIDEM TARTRATE 10 MG PO TABS: 10.0000 mg | ORAL_TABLET | Freq: Every evening | ORAL | Status: DC | PRN

## 2015-10-18 NOTE — Progress Notes (Signed)
Patient ID: Stacey [name], female   DOB: [birth year], 75 y.o.   MRN: [id number]        Patient: Stacey [name] Female    DOB: 1940/06/14   75 y.o.   MRN: [id number] Visit Date: 10/18/2015  Today's Provider: Margarita Rana, MD   No chief complaint on file.  Subjective:    Anxiety Presents for follow-up visit. The problem has been gradually improving. Symptoms include depressed mood, excessive worry, insomnia, nervous/anxious behavior and panic. Patient reports no chest pain, confusion, decreased concentration, dizziness, dry mouth, feeling of choking, irritability, malaise, muscle tension, nausea, obsessions, palpitations, restlessness, shortness of breath or suicidal ideas. The quality of sleep is poor.   Past treatments include SSRIs. The treatment provided mild relief. Compliance with prior treatments has been good.    Hypertension, follow-up:  BP Readings from Last 3 Encounters:  10/18/15 138/80  09/17/15 173/94  09/11/15 162/80    She was last seen for hypertension 4 weeks ago.  Management since that visit includes Increased Lisinopril to 40mg  She reports excellent compliance with treatment. She is not having side effects.  She is very active  She is adherent to low salt diet.   Outside blood pressures are 140's/80's. She is experiencing none.  Patient denies chest pain, irregular heart beat, near-syncope and palpitations.   Cardiovascular risk factors include advanced age (older than 67 for men, 70 for women).   Also sleeping ok, but not great. Needs her Lexapro refilled.   ------------------------------------------------------------------------    Allergies  Allergen Reactions  . Cephalexin Anaphylaxis  . Atorvastatin   . Statins Other (See Comments)    Muscle pain  . Penicillins Hives and Rash   Previous Medications   AMLODIPINE (NORVASC) 5 MG TABLET    TAKE 1 TABLET (5 MG TOTAL) BY MOUTH DAILY.   ASPIRIN 81 MG TABLET    ASPIRIN, 81MG  (Oral Tablet  Delayed Release)  1 Every Day for 0 days  Quantity: 0.00;  Refills: 0   Ordered :17-November-2010  Stacey [name] ;  Started 05-Oct-2006 Active Comments: DX: 401.9   CELECOXIB (CELEBREX) 200 MG CAPSULE    Take 1 capsule (200 mg total) by mouth daily.   CHOLECALCIFEROL (VITAMIN D) 2000 UNITS TABLET    Take 2,000 Units by mouth daily.   DIAZEPAM (VALIUM) 10 MG TABLET    TAKE 1 TABLET EVERY DAY   EPIPEN 2-PAK 0.3 MG/0.3ML SOAJ INJECTION    INJECT AS DIRECTED FOR SEVERE ALLERGIC REACTION   ESCITALOPRAM (LEXAPRO) 10 MG TABLET    Take 1 tablet (10 mg total) by mouth daily.   ESCITALOPRAM (LEXAPRO) 10 MG TABLET    Take 1 tablet (10 mg total) by mouth daily.   FLUTICASONE (FLONASE) 50 MCG/ACT NASAL SPRAY    Place 2 sprays into both nostrils daily.   GABAPENTIN (NEURONTIN) 300 MG CAPSULE    Take 1 capsule (300 mg total) by mouth 3 (three) times daily.   LEVOTHYROXINE (SYNTHROID, LEVOTHROID) 88 MCG TABLET    TAKE 1 TABLET EVERY DAY   LISINOPRIL (PRINIVIL,ZESTRIL) 40 MG TABLET    TAKE 1 TABLET (40 MG TOTAL) BY MOUTH DAILY. DOSE CHANGE.   NEBIVOLOL HCL 20 MG TABS    Take 1 tablet (20 mg total) by mouth daily.   TACROLIMUS (PROTOPIC) 0.1 % OINTMENT    TACROLIMUS, 0.1% (External Ointment) - Historical Medication  two times daily (0.1 %) Active   VENTOLIN HFA 108 (90 BASE) MCG/ACT INHALER    INHALE 2 PUFFS BY MOUTH  EVERY 4 TO 6 HOURS AS NEEDED FOR SHORTNESS OF BREATH   ZOLPIDEM (AMBIEN) 10 MG TABLET    Take 1 tablet (10 mg total) by mouth at bedtime as needed for sleep.    Review of Systems  Constitutional: Negative.  Negative for irritability.  Respiratory: Positive for cough. Negative for apnea, choking, chest tightness, shortness of breath, wheezing and stridor.   Cardiovascular: Negative.  Negative for chest pain and palpitations.  Gastrointestinal: Positive for abdominal pain and diarrhea. Negative for nausea, vomiting, constipation, blood in stool, abdominal distention, anal bleeding and rectal pain.       Pt  reports she was recently on an antibiotic that caused some stomach issues.  She says it is getting better now.  Neurological: Negative for dizziness.  Psychiatric/Behavioral: Positive for sleep disturbance and dysphoric mood. Negative for suicidal ideas, hallucinations, behavioral problems, confusion, self-injury, decreased concentration and agitation. The patient is nervous/anxious and has insomnia. The patient is not hyperactive.     Social History  Substance Use Topics  . Smoking status: Former Research scientist (life sciences)  . Smokeless tobacco: Never Used     Comment: quit 45  years ago   . Alcohol Use: No   Objective:   BP 138/80 mmHg  Pulse 68  Temp(Src) 98.6 F (37 C) (Oral)  Resp 16  Wt 184 lb (83.462 kg)  Physical Exam  Constitutional: She is oriented to person, place, and time. She appears well-developed and well-nourished.  Cardiovascular: Normal rate and regular rhythm.   Pulmonary/Chest: Effort normal and breath sounds normal.  Neurological: She is alert and oriented to person, place, and time.  Skin: Skin is warm and dry.  Psychiatric: She has a normal mood and affect. Her behavior is normal. Judgment and thought content normal.      Assessment & Plan:     1. Panic attacks Improved on Lexapro.  Continue current medication. Follow up as needed.    2. Essential hypertension Condition is stable. Please continue current medication and  plan of care as noted.    3. Insomnia Condition is stable. Please continue current medication and  plan of care as noted.   - zolpidem (AMBIEN) 10 MG tablet; Take 1 tablet (10 mg total) by mouth at bedtime as needed for sleep.  Dispense: 30 tablet; Refill: 5  Patient was seen and examined by Jerrell Belfast, MD, and note scribed by Stacey [name], CMA.  I have reviewed the document for accuracy and completeness and I agree with above. - Jerrell Belfast, MD        Margarita Rana, MD  [facility]

## 2015-10-24 DIAGNOSIS — D1800 Hemangioma unspecified site: Secondary | ICD-10-CM | POA: Diagnosis not present

## 2015-10-24 DIAGNOSIS — I781 Nevus, non-neoplastic: Secondary | ICD-10-CM | POA: Diagnosis not present

## 2015-10-24 DIAGNOSIS — L820 Inflamed seborrheic keratosis: Secondary | ICD-10-CM | POA: Diagnosis not present

## 2015-10-24 DIAGNOSIS — L570 Actinic keratosis: Secondary | ICD-10-CM | POA: Diagnosis not present

## 2015-10-24 DIAGNOSIS — I8393 Asymptomatic varicose veins of bilateral lower extremities: Secondary | ICD-10-CM | POA: Diagnosis not present

## 2015-10-24 DIAGNOSIS — D229 Melanocytic nevi, unspecified: Secondary | ICD-10-CM | POA: Diagnosis not present

## 2015-10-24 DIAGNOSIS — L821 Other seborrheic keratosis: Secondary | ICD-10-CM | POA: Diagnosis not present

## 2015-10-24 DIAGNOSIS — Z1283 Encounter for screening for malignant neoplasm of skin: Secondary | ICD-10-CM | POA: Diagnosis not present

## 2015-10-24 DIAGNOSIS — Z85828 Personal history of other malignant neoplasm of skin: Secondary | ICD-10-CM | POA: Diagnosis not present

## 2015-10-24 DIAGNOSIS — L815 Leukoderma, not elsewhere classified: Secondary | ICD-10-CM | POA: Diagnosis not present

## 2015-12-04 ENCOUNTER — Ambulatory Visit (INDEPENDENT_AMBULATORY_CARE_PROVIDER_SITE_OTHER): Payer: Medicare Other | Admitting: Physician Assistant

## 2015-12-04 ENCOUNTER — Encounter: Payer: Self-pay | Admitting: Physician Assistant

## 2015-12-04 VITALS — BP 160/80 | HR 72 | Temp 97.2°F | Resp 16 | Ht 62.0 in | Wt 183.8 lb

## 2015-12-04 DIAGNOSIS — Z1211 Encounter for screening for malignant neoplasm of colon: Secondary | ICD-10-CM

## 2015-12-04 DIAGNOSIS — Z0000 Encounter for general adult medical examination without abnormal findings: Secondary | ICD-10-CM | POA: Diagnosis not present

## 2015-12-04 DIAGNOSIS — Z1239 Encounter for other screening for malignant neoplasm of breast: Secondary | ICD-10-CM

## 2015-12-04 DIAGNOSIS — Z01419 Encounter for gynecological examination (general) (routine) without abnormal findings: Secondary | ICD-10-CM | POA: Diagnosis not present

## 2015-12-04 DIAGNOSIS — Z780 Asymptomatic menopausal state: Secondary | ICD-10-CM

## 2015-12-04 NOTE — Patient Instructions (Signed)

## 2015-12-04 NOTE — Progress Notes (Signed)
Patient: Stacey [name], Female    DOB: 1941/01/05, 75 y.o.   MRN: [id number] Visit Date: 12/04/2015  Today's Provider: Mar Daring, PA-C   Chief Complaint  Patient presents with  . Medicare Wellness   Subjective:    Annual wellness visit Stacey [name] is a 75 y.o. female. She feels fairly well. 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 She reports she is sleeping poorly.Takes 1/2 Ambien as needed. This helps her sleep.   Last PCP:11/30/2014 Pap-11/22/13 Normal Colonoscopy:02/03/2011-Diverticulosis BMD: 12/21/13-Osteopenia EKG: 03/26/2011 Mammogram:09/13/2014-Normal -----------------------------------------------------------  Review of Systems  Constitutional: Negative.   HENT: Positive for mouth sores (Dry mouth).   Eyes: Positive for photophobia, discharge and itching.  Respiratory: Positive for cough and wheezing (some).   Cardiovascular: Negative.   Gastrointestinal: Negative.   Endocrine: Positive for cold intolerance and heat intolerance.  Genitourinary: Negative.   Musculoskeletal: Positive for myalgias, back pain, arthralgias, neck pain and neck stiffness.  Allergic/Immunologic: Positive for environmental allergies and food allergies.  Neurological: Positive for headaches.  Hematological: Negative.   Psychiatric/Behavioral: Positive for sleep disturbance. The patient is nervous/anxious and is hyperactive.   All ROS that are positive are chronic issues, none acute.  Social History   Social History  . Marital Status: Married    Spouse Name: [name]  . Number of Children: 3  . Years of Education: N/A   Occupational History  . Not on file.   Social History Main Topics  . Smoking status: Former Research scientist (life sciences)  . Smokeless tobacco: Never Used     Comment: quit 45  years ago   . Alcohol Use: No  . Drug Use: No  . Sexual Activity: Not on file   Other Topics Concern  . Not on file   Social History Narrative    Past Medical  History  Diagnosis Date  . Cancer ([place])   . Glaucoma   . Thyroid disease   . Hypertension   . Depression      Patient Active Problem List   Diagnosis Date Noted  . Panic attacks 09/11/2015  . Ganglion cyst 08/16/2015  . Osteoarthritis of ankle and foot 08/16/2015  . Hypernatremia 07/04/2015  . Upper back pain 07/04/2015  . Other osteoarthritis involving multiple joints 02/06/2015  . Adult BMI 30+ 09/27/2014  . Clinical depression 09/27/2014  . Dry mouth 09/27/2014  . Accumulation of fluid in tissues 09/27/2014  . Fibrositis 09/27/2014  . Cephalalgia 09/27/2014  . LBP (low back pain) 09/27/2014  . Screening for osteoporosis 09/27/2014  . Avitaminosis D 09/27/2014  . Decreased leukocytes 09/27/2014  . History of colon polyps 11/14/2009  . Abnormal blood sugar 05/03/2009  . Insomnia 09/29/2007  . BP (high blood pressure) 03/09/2007  . Adaptation reaction 01/29/2007  . Acid reflux 10/07/2006  . Menopausal and postmenopausal disorder 10/05/2006  . Headache, migraine 10/05/2006  . Arthritis, degenerative 03/23/1994  . Adult hypothyroidism 04/15/1993  . Hypercholesteremia 03/25/1993    Past Surgical History  Procedure Laterality Date  . Abdominal hysterectomy    . Nasal sinus surgery    . Right oophorectomy    . Appendectomy    . Bladder surgery      Her family history includes Alzheimer's disease in her mother; Cancer in her sister; Heart attack in her father; Heart attack (age of onset: 102) in her brother; Heart attack (age of onset: 54) in her brother; Kidney cancer in her mother; Kidney disease in her sister; Ovarian cancer in her  sister; Pancreatic cancer in her sister.    Current Meds  Medication Sig  . amLODipine (NORVASC) 5 MG tablet TAKE 1 TABLET (5 MG TOTAL) BY MOUTH DAILY.  Marland Kitchen aspirin 81 MG tablet ASPIRIN, 81MG  (Oral Tablet Delayed Release)  1 Every Day for 0 days  Quantity: 0.00;  Refills: 0   Ordered :17-November-2010  [name] ;  Started 05-Oct-2006 Active  Comments: DX: 401.9  . celecoxib (CELEBREX) 200 MG capsule Take 1 capsule (200 mg total) by mouth daily.  . Cholecalciferol (VITAMIN D) 2000 units tablet Take 2,000 Units by mouth daily.  . diazepam (VALIUM) 10 MG tablet TAKE 1 TABLET EVERY DAY (Patient taking differently: TAKE 1 TABLET AS NEEDED)  . EPIPEN 2-PAK 0.3 MG/0.3ML SOAJ injection INJECT AS DIRECTED FOR SEVERE ALLERGIC REACTION  . escitalopram (LEXAPRO) 10 MG tablet Take 1 tablet (10 mg total) by mouth daily.  Marland Kitchen escitalopram (LEXAPRO) 10 MG tablet Take 1 tablet (10 mg total) by mouth daily.  . fluticasone (FLONASE) 50 MCG/ACT nasal spray Place 2 sprays into both nostrils daily.  Marland Kitchen gabapentin (NEURONTIN) 300 MG capsule Take 1 capsule (300 mg total) by mouth 3 (three) times daily. (Patient taking differently: Take 300 mg by mouth 2 (two) times daily. )  . levothyroxine (SYNTHROID, LEVOTHROID) 88 MCG tablet TAKE 1 TABLET EVERY DAY  . lisinopril (PRINIVIL,ZESTRIL) 40 MG tablet TAKE 1 TABLET (40 MG TOTAL) BY MOUTH DAILY. DOSE CHANGE.  Marland Kitchen Nebivolol HCl 20 MG TABS Take 1 tablet (20 mg total) by mouth daily.  . tacrolimus (PROTOPIC) 0.1 % ointment TACROLIMUS, 0.1% (External Ointment) - Historical Medication  two times daily (0.1 %) Active  . VENTOLIN HFA 108 (90 Base) MCG/ACT inhaler INHALE 2 PUFFS BY MOUTH EVERY 4 TO 6 HOURS AS NEEDED FOR SHORTNESS OF BREATH  . zolpidem (AMBIEN) 10 MG tablet Take 1 tablet (10 mg total) by mouth at bedtime as needed for sleep.    Patient Care Team: Margarita Rana, MD as PCP - General (Family Medicine)    Objective:   Vitals: BP 160/80 mmHg  Pulse 72  Temp(Src) 97.2 F (36.2 C) (Oral)  Resp 16  Ht 5\' 2"  (1.575 m)  Wt 183 lb 12.8 oz (83.371 kg)  BMI 33.61 kg/m2  Physical Exam  Constitutional: She is oriented to person, place, and time. She appears well-developed and well-nourished. No distress.  HENT:  Head: Normocephalic and atraumatic.  Right Ear: External ear normal.  Left Ear: External ear  normal.  Nose: Nose normal.  Mouth/Throat: Oropharynx is clear and moist. No oropharyngeal exudate.  Eyes: Conjunctivae and EOM are normal. Pupils are equal, round, and reactive to light. Right eye exhibits no discharge. Left eye exhibits no discharge. No scleral icterus.  Neck: Normal range of motion. Neck supple. No JVD present. No tracheal deviation present. No thyromegaly present.  Cardiovascular: Normal rate, regular rhythm, normal heart sounds and intact distal pulses.  Exam reveals no gallop and no friction rub.   No murmur heard. Pulmonary/Chest: Effort normal and breath sounds normal. No respiratory distress. She has no wheezes. She has no rales. She exhibits no tenderness. Right breast exhibits no inverted nipple, no mass, no nipple discharge, no skin change and no tenderness. Left breast exhibits no inverted nipple, no mass, no nipple discharge, no skin change and no tenderness. Breasts are symmetrical.  Abdominal: Soft. Bowel sounds are normal. She exhibits no distension and no mass. There is no tenderness. There is no rebound and no guarding.  Musculoskeletal: Normal range  of motion. She exhibits no edema or tenderness.  Lymphadenopathy:    She has no cervical adenopathy.  Neurological: She is alert and oriented to person, place, and time.  Skin: Skin is warm and dry. No rash noted. She is not diaphoretic.  Psychiatric: She has a normal mood and affect. Her behavior is normal. Judgment and thought content normal.  Vitals reviewed.   Activities of Daily Living In your present state of health, do you have any difficulty performing the following activities: 12/04/2015  Hearing? N  Vision? N  Difficulty concentrating or making decisions? N  Walking or climbing stairs? N  Dressing or bathing? N  Doing errands, shopping? N    Fall Risk Assessment Fall Risk  12/04/2015 11/30/2014  Falls in the past year? No Yes  Number falls in past yr: - 2 or more  Injury with Fall? - Yes       Depression Screen PHQ 2/9 Scores 12/04/2015 11/30/2014  PHQ - 2 Score 0 2  PHQ- 9 Score - 12    Cognitive Testing - 6-CIT  Correct? Score   What year is it? yes 0 0 or 4  What month is it? yes 0 0 or 3  Memorize:    Pia Mau,  44,  [place],      What time is it? (within 1 hour) yes 0 0 or 3  Count backwards from 20 yes 0 0, 2, or 4  Name the months of the year yes 0 0, 2, or 4  Repeat name & address above yes 3 0, 2, 4, 6, 8, or 10       TOTAL SCORE  3/28   Interpretation:  Normal  Normal (0-7) Abnormal (8-28)   Audit-C Alcohol Use Screening  Question Answer Points  How often do you have alcoholic drink? never 0  On days you do drink alcohol, how many drinks do you typically consume? 0 0  How oftey will you drink 6 or more in a total? never 0  Total Score:  0   A score of 3 or more in women, and 4 or more in men indicates increased risk for alcohol abuse, EXCEPT if all of the points are from question 1.     Assessment & Plan:     Annual Wellness Visit  Reviewed patient's Family Medical History Reviewed and updated list of patient's medical providers Assessment of cognitive impairment was done Assessed patient's functional ability Established a written schedule for health screening [place] Completed and Reviewed  Exercise Activities and Dietary recommendations Goals    . Exercise 150 minutes per week (moderate activity)       Immunization History  Administered Date(s) Administered  . Influenza,inj,Quad PF,36+ Mos 01/24/2015  . Pneumococcal Conjugate-13 11/22/2013  . Pneumococcal Polysaccharide-23 03/30/2005, 11/19/2011  . Tdap 10/01/2005  . Zoster 11/19/2011    Health Maintenance  Topic Date Due  . TETANUS/TDAP  10/02/2015  . INFLUENZA VACCINE  12/24/2015  . MAMMOGRAM  09/12/2016  . COLONOSCOPY  02/02/2021  . DEXA SCAN  Completed  . ZOSTAVAX  Completed  . PNA vac Low Risk Adult  Completed      Discussed health  benefits of physical activity, and encouraged her to engage in regular exercise appropriate for her age and condition.  1. Annual physical exam Normal physical exam today. She is to call the office in the meantime if she has any acute issue, questions or concerns. I will see her  back in one month for chronic issues and labs.   2. Encounter for screening breast examination Breast exam today was normal. There is no family history of breast cancer. She does perform regular self breast exams. Mammogram was ordered as below. Information for [facility] was given to patient so she may schedule her mammogram at her convenience. - MM DIGITAL SCREENING BILATERAL; Future  3. Colon cancer screening Family history of colon cancer. Colonoscopy in 01/2011 showed diverticulosis and was to repeat in 5 years due to family history. Referral placed as below. - Ambulatory referral to Gastroenterology  4. Postmenopausal estrogen deficiency - DG Bone Density; Future   ------------------------------------------------------------------------------------------------------------    Mar Daring, PA-C  [facility]

## 2015-12-09 ENCOUNTER — Telehealth: Payer: Self-pay

## 2015-12-09 ENCOUNTER — Other Ambulatory Visit: Payer: Self-pay

## 2015-12-09 NOTE — Telephone Encounter (Signed)
Gastroenterology Pre-Procedure Review  Request Date: 01/17/2016  Requesting Physician: Dr. Venia Minks  PATIENT REVIEW QUESTIONS: The patient responded to the following health history questions as indicated:    1. Are you having any GI issues? yes (Diarrhea and constipation, gassy) 2. Do you have a personal history of Polyps? yes (benign) 3. Do you have a family history of Colon Cancer or Polyps? yes (mother, polyps) 4. Diabetes Mellitus? no 5. Joint replacements in the past 12 months?no 6. Major health problems in the past 3 months?no 7. Any artificial heart valves, MVP, or defibrillator?no    MEDICATIONS & ALLERGIES:    Patient reports the following regarding taking any anticoagulation/antiplatelet therapy:   Plavix, Coumadin, Eliquis, Xarelto, Lovenox, Pradaxa, Brilinta, or Effient? no Aspirin? yes (blood thinner)  Patient confirms/reports the following medications:  Current Outpatient Prescriptions  Medication Sig Dispense Refill  . amLODipine (NORVASC) 5 MG tablet TAKE 1 TABLET (5 MG TOTAL) BY MOUTH DAILY. 90 tablet 1  . aspirin 81 MG tablet ASPIRIN, 81MG  (Oral Tablet Delayed Release)  1 Every Day for 0 days  Quantity: 0.00;  Refills: 0   Ordered :17-November-2010  [name] ;  Started 05-Oct-2006 Active Comments: DX: 401.9    . celecoxib (CELEBREX) 200 MG capsule Take 1 capsule (200 mg total) by mouth daily. 30 capsule 0  . Cholecalciferol (VITAMIN D) 2000 units tablet Take 2,000 Units by mouth daily.    . Cyanocobalamin (B-12) 500 MCG TABS Take by mouth.    . diazepam (VALIUM) 10 MG tablet TAKE 1 TABLET EVERY DAY (Patient taking differently: TAKE 1 TABLET AS NEEDED) 30 tablet 5  . EPIPEN 2-PAK 0.3 MG/0.3ML SOAJ injection INJECT AS DIRECTED FOR SEVERE ALLERGIC REACTION  1  . escitalopram (LEXAPRO) 10 MG tablet Take 1 tablet (10 mg total) by mouth daily. 90 tablet 1  . fluticasone (FLONASE) 50 MCG/ACT nasal spray Place 2 sprays into both nostrils daily. 16 g 6  . gabapentin  (NEURONTIN) 300 MG capsule Take 1 capsule (300 mg total) by mouth 3 (three) times daily. (Patient taking differently: Take 300 mg by mouth 2 (two) times daily. ) 90 capsule 3  . levothyroxine (SYNTHROID, LEVOTHROID) 88 MCG tablet TAKE 1 TABLET EVERY DAY 90 tablet 3  . lisinopril (PRINIVIL,ZESTRIL) 40 MG tablet TAKE 1 TABLET (40 MG TOTAL) BY MOUTH DAILY. DOSE CHANGE. 90 tablet 1  . Nebivolol HCl 20 MG TABS Take 1 tablet (20 mg total) by mouth daily. 90 tablet 1  . tacrolimus (PROTOPIC) 0.1 % ointment TACROLIMUS, 0.1% (External Ointment) - Historical Medication  two times daily (0.1 %) Active    . VENTOLIN HFA 108 (90 Base) MCG/ACT inhaler INHALE 2 PUFFS BY MOUTH EVERY 4 TO 6 HOURS AS NEEDED FOR SHORTNESS OF BREATH 18 g 5  . zolpidem (AMBIEN) 10 MG tablet Take 1 tablet (10 mg total) by mouth at bedtime as needed for sleep. 30 tablet 5   No current facility-administered medications for this visit.    Patient confirms/reports the following allergies:  Allergies  Allergen Reactions  . Cephalexin Anaphylaxis  . Atorvastatin   . Statins Other (See Comments)    Muscle pain  . Penicillins Hives and Rash    No orders of the defined types were placed in this encounter.    AUTHORIZATION INFORMATION Primary Insurance: 1D#: Group #:  Secondary Insurance: 1D#: Group #:  SCHEDULE INFORMATION: Date: 01/17/2016 Time: Location: [facility]

## 2015-12-09 NOTE — Telephone Encounter (Signed)
error 

## 2015-12-16 ENCOUNTER — Telehealth: Payer: Self-pay | Admitting: Emergency Medicine

## 2015-12-16 MED ORDER — GABAPENTIN 300 MG PO CAPS: 300.0000 mg | ORAL_CAPSULE | Freq: Two times a day (BID) | ORAL | 1 refills | Status: DC

## 2015-12-16 NOTE — Telephone Encounter (Signed)
Pharmacy faxed for a refill on Gabapentin 300 mg TID. Please advise. Thanks.

## 2015-12-16 NOTE — Telephone Encounter (Signed)
Gabapentin refilled

## 2015-12-17 DIAGNOSIS — M85852 Other specified disorders of bone density and structure, left thigh: Secondary | ICD-10-CM | POA: Diagnosis not present

## 2015-12-17 DIAGNOSIS — Z1231 Encounter for screening mammogram for malignant neoplasm of breast: Secondary | ICD-10-CM | POA: Diagnosis not present

## 2015-12-17 DIAGNOSIS — M85862 Other specified disorders of bone density and structure, left lower leg: Secondary | ICD-10-CM | POA: Diagnosis not present

## 2015-12-17 DIAGNOSIS — M8588 Other specified disorders of bone density and structure, other site: Secondary | ICD-10-CM | POA: Diagnosis not present

## 2015-12-17 DIAGNOSIS — Z780 Asymptomatic menopausal state: Secondary | ICD-10-CM | POA: Diagnosis not present

## 2015-12-26 ENCOUNTER — Encounter: Payer: Self-pay | Admitting: Family Medicine

## 2015-12-26 ENCOUNTER — Ambulatory Visit (INDEPENDENT_AMBULATORY_CARE_PROVIDER_SITE_OTHER): Payer: Medicare Other | Admitting: Family Medicine

## 2015-12-26 VITALS — BP 140/84 | HR 81 | Temp 98.2°F | Resp 16

## 2015-12-26 DIAGNOSIS — R112 Nausea with vomiting, unspecified: Secondary | ICD-10-CM | POA: Diagnosis not present

## 2015-12-26 DIAGNOSIS — R202 Paresthesia of skin: Secondary | ICD-10-CM | POA: Diagnosis not present

## 2015-12-26 MED ORDER — ONDANSETRON HCL 4 MG PO TABS: 4.0000 mg | ORAL_TABLET | Freq: Three times a day (TID) | ORAL | 0 refills | Status: DC | PRN

## 2015-12-26 NOTE — Patient Instructions (Signed)
We will call you with the lab results. 

## 2015-12-26 NOTE — Progress Notes (Signed)
Subjective:     Patient ID: Stacey [name], female   DOB: 07-30-1940, 75 y.o.   MRN: [id number]  HPI  Chief Complaint  Patient presents with  . Allergic Reaction    Patient comes in office today with concerns of a possible allergic reaction after consuming peanut butter an hr ago. Patient reports feeling opf burning throughout her whole body and numbness. Patient states that she is very lethargic and nauses and has taken otc Benadryl  Reports nausea, vomiting, and loose stools. 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 States she has taken a few of her medications today but not all.   Review of Systems     Objective:   Physical Exam  Constitutional: She appears well-developed and well-nourished. She has a sickly appearance.  Cardiovascular: Normal rate and regular rhythm.   Pulmonary/Chest: Breath sounds normal. She has no rales.  Abdominal: Soft. There is no tenderness.  Musculoskeletal: She exhibits no edema (of lower extremities).       Assessment:    1. Non-intractable vomiting with nausea, vomiting of unspecified type: ? Onset of viral gastroenteritis - Comprehensive metabolic panel - CBC with Differential/Platelet - ondansetron (ZOFRAN) 4 MG tablet; Take 1 tablet (4 mg total) by mouth every 8 (eight) hours as needed for nausea or vomiting.  Dispense: 12 tablet; Refill: 0  2. Paresthesias: ? SSRI discontinuation symptoms-unclear whether she is taking regularly    Plan:   Report to ER if symptoms worsening. Provided daughter with a wheelchair to get her to the lab. Further f/u pending lab work.

## 2015-12-27 ENCOUNTER — Encounter: Payer: Self-pay | Admitting: Physician Assistant

## 2015-12-27 LAB — CBC WITH DIFFERENTIAL/PLATELET
BASOS: 1 %
Basophils Absolute: 0.1 10*3/uL (ref 0.0–0.2)
EOS (ABSOLUTE): 0.1 10*3/uL (ref 0.0–0.4)
EOS: 2 %
HEMATOCRIT: 39.4 % (ref 34.0–46.6)
Hemoglobin: 13 g/dL (ref 11.1–15.9)
IMMATURE GRANS (ABS): 0 10*3/uL (ref 0.0–0.1)
IMMATURE GRANULOCYTES: 0 %
LYMPHS: 15 %
Lymphocytes Absolute: 0.9 10*3/uL (ref 0.7–3.1)
MCH: 28.3 pg (ref 26.6–33.0)
MCHC: 33 g/dL (ref 31.5–35.7)
MCV: 86 fL (ref 79–97)
Monocytes Absolute: 0.4 10*3/uL (ref 0.1–0.9)
Monocytes: 7 %
NEUTROS ABS: 4.7 10*3/uL (ref 1.4–7.0)
NEUTROS PCT: 75 %
PLATELETS: 197 10*3/uL (ref 150–379)
RBC: 4.6 x10E6/uL (ref 3.77–5.28)
RDW: 14 % (ref 12.3–15.4)
WBC: 6.2 10*3/uL (ref 3.4–10.8)

## 2015-12-27 LAB — COMPREHENSIVE METABOLIC PANEL
ALBUMIN: 4.5 g/dL (ref 3.5–4.8)
ALK PHOS: 71 IU/L (ref 39–117)
ALT: 10 IU/L (ref 0–32)
AST: 17 IU/L (ref 0–40)
Albumin/Globulin Ratio: 2.3 — ABNORMAL HIGH (ref 1.2–2.2)
BILIRUBIN TOTAL: 0.3 mg/dL (ref 0.0–1.2)
BUN/Creatinine Ratio: 23 (ref 12–28)
BUN: 18 mg/dL (ref 8–27)
CO2: 27 mmol/L (ref 18–29)
CREATININE: 0.79 mg/dL (ref 0.57–1.00)
Calcium: 9 mg/dL (ref 8.7–10.3)
Chloride: 93 mmol/L — ABNORMAL LOW (ref 96–106)
GFR calc Af Amer: 86 mL/min/{1.73_m2} (ref >59)
GFR calc non Af Amer: 74 mL/min/{1.73_m2} (ref >59)
GLOBULIN, TOTAL: 2 g/dL (ref 1.5–4.5)
GLUCOSE: 133 mg/dL — AB (ref 65–99)
POTASSIUM: 4.5 mmol/L (ref 3.5–5.2)
SODIUM: 136 mmol/L (ref 134–144)
Total Protein: 6.5 g/dL (ref 6.0–8.5)

## 2016-01-06 ENCOUNTER — Encounter: Payer: Self-pay | Admitting: Physician Assistant

## 2016-01-06 ENCOUNTER — Ambulatory Visit (INDEPENDENT_AMBULATORY_CARE_PROVIDER_SITE_OTHER): Payer: Medicare Other | Admitting: Physician Assistant

## 2016-01-06 VITALS — BP 152/90 | HR 62 | Temp 98.1°F | Resp 16 | Wt 185.6 lb

## 2016-01-06 DIAGNOSIS — T7801XA Anaphylactic reaction due to peanuts, initial encounter: Secondary | ICD-10-CM | POA: Diagnosis not present

## 2016-01-06 DIAGNOSIS — R7309 Other abnormal glucose: Secondary | ICD-10-CM | POA: Diagnosis not present

## 2016-01-06 DIAGNOSIS — G629 Polyneuropathy, unspecified: Secondary | ICD-10-CM | POA: Diagnosis not present

## 2016-01-06 DIAGNOSIS — H65111 Acute and subacute allergic otitis media (mucoid) (sanguinous) (serous), right ear: Secondary | ICD-10-CM

## 2016-01-06 DIAGNOSIS — E039 Hypothyroidism, unspecified: Secondary | ICD-10-CM

## 2016-01-06 DIAGNOSIS — M158 Other polyosteoarthritis: Secondary | ICD-10-CM | POA: Diagnosis not present

## 2016-01-06 DIAGNOSIS — F329 Major depressive disorder, single episode, unspecified: Secondary | ICD-10-CM

## 2016-01-06 DIAGNOSIS — G4700 Insomnia, unspecified: Secondary | ICD-10-CM | POA: Diagnosis not present

## 2016-01-06 DIAGNOSIS — E7800 Pure hypercholesterolemia, unspecified: Secondary | ICD-10-CM | POA: Diagnosis not present

## 2016-01-06 DIAGNOSIS — I10 Essential (primary) hypertension: Secondary | ICD-10-CM | POA: Diagnosis not present

## 2016-01-06 DIAGNOSIS — F32A Depression, unspecified: Secondary | ICD-10-CM

## 2016-01-06 MED ORDER — NEBIVOLOL HCL 20 MG PO TABS: 20.0000 mg | ORAL_TABLET | Freq: Every day | ORAL | 3 refills | Status: DC

## 2016-01-06 MED ORDER — EPINEPHRINE 0.3 MG/0.3ML IJ SOAJ: INTRAMUSCULAR | 1 refills | Status: DC

## 2016-01-06 MED ORDER — ESCITALOPRAM OXALATE 10 MG PO TABS: 10.0000 mg | ORAL_TABLET | Freq: Every day | ORAL | 3 refills | Status: DC

## 2016-01-06 MED ORDER — AMLODIPINE BESYLATE 5 MG PO TABS: ORAL_TABLET | ORAL | 3 refills | Status: DC

## 2016-01-06 MED ORDER — LISINOPRIL 40 MG PO TABS: ORAL_TABLET | ORAL | 3 refills | Status: DC

## 2016-01-06 MED ORDER — LEVOTHYROXINE SODIUM 88 MCG PO TABS: 88.0000 ug | ORAL_TABLET | Freq: Every day | ORAL | 3 refills | Status: DC

## 2016-01-06 MED ORDER — DIAZEPAM 10 MG PO TABS: 10.0000 mg | ORAL_TABLET | ORAL | 5 refills | Status: DC | PRN

## 2016-01-06 MED ORDER — CELECOXIB 200 MG PO CAPS
200.0000 mg | ORAL_CAPSULE | Freq: Every day | ORAL | 5 refills | Status: DC
Start: 2016-01-06 — End: 2016-06-12

## 2016-01-06 MED ORDER — GABAPENTIN 300 MG PO CAPS: 300.0000 mg | ORAL_CAPSULE | Freq: Two times a day (BID) | ORAL | 3 refills | Status: DC

## 2016-01-06 MED ORDER — ZOLPIDEM TARTRATE 10 MG PO TABS: 10.0000 mg | ORAL_TABLET | Freq: Every evening | ORAL | 5 refills | Status: DC | PRN

## 2016-01-06 MED ORDER — FLUTICASONE PROPIONATE 50 MCG/ACT NA SUSP: 2.0000 | Freq: Every day | NASAL | 11 refills | Status: DC

## 2016-01-06 NOTE — Progress Notes (Signed)
Patient: Stacey [name] Female    DOB: 1941-03-24   75 y.o.   MRN: [id number] Visit Date: 01/06/2016  Today's Provider: Mar Daring, PA-C   Chief Complaint  Patient presents with  . Follow-up    Chrinic issues and labs   Subjective:    HPI Patient is here a discuss chronic issues and labs. She needs a refill on all her medications. She feels she has been doing well for the most part. States that 3 weeks ago she had something like a panic attack. She did come to the office and get evaluated. She actually reports that day she had a little bit of graham cracker with peanut butter. She began feeling a hot sensation all over her body, then had blurred vision and a pain over her right eye. She has had a similar occurrence when she had a granola bar with peanuts a few years back. She went to the hospital that time. She reports she had tested positive for peanut allergy but still occasionally has them. She has epi-pens on hand if she has an anaphylactic reaction. She now believes that episode was an allergic reaction to the peanut butter rather than a panic attack now.      Allergies  Allergen Reactions  . Cephalexin Anaphylaxis  . Atorvastatin   . Statins Other (See Comments)    Muscle pain  . Penicillins Hives and Rash   Current Meds  Medication Sig  . amLODipine (NORVASC) 5 MG tablet TAKE 1 TABLET (5 MG TOTAL) BY MOUTH DAILY.  Marland Kitchen aspirin 81 MG tablet ASPIRIN, 81MG  (Oral Tablet Delayed Release)  1 Every Day for 0 days  Quantity: 0.00;  Refills: 0   Ordered :17-November-2010  [name] ;  Started 05-Oct-2006 Active Comments: DX: 401.9  . celecoxib (CELEBREX) 200 MG capsule Take 1 capsule (200 mg total) by mouth daily.  . Cholecalciferol (VITAMIN D) 2000 units tablet Take 2,000 Units by mouth daily.  . Cyanocobalamin (B-12) 500 MCG TABS Take by mouth.  . diazepam (VALIUM) 10 MG tablet TAKE 1 TABLET EVERY DAY (Patient taking differently: TAKE 1 TABLET AS NEEDED)  . EPIPEN  2-PAK 0.3 MG/0.3ML SOAJ injection INJECT AS DIRECTED FOR SEVERE ALLERGIC REACTION  . escitalopram (LEXAPRO) 10 MG tablet Take 1 tablet (10 mg total) by mouth daily.  . fluticasone (FLONASE) 50 MCG/ACT nasal spray Place 2 sprays into both nostrils daily.  Marland Kitchen gabapentin (NEURONTIN) 300 MG capsule Take 1 capsule (300 mg total) by mouth 2 (two) times daily.  Marland Kitchen levothyroxine (SYNTHROID, LEVOTHROID) 88 MCG tablet TAKE 1 TABLET EVERY DAY  . lisinopril (PRINIVIL,ZESTRIL) 40 MG tablet TAKE 1 TABLET (40 MG TOTAL) BY MOUTH DAILY. DOSE CHANGE.  Marland Kitchen Nebivolol HCl 20 MG TABS Take 1 tablet (20 mg total) by mouth daily.  . ondansetron (ZOFRAN) 4 MG tablet Take 1 tablet (4 mg total) by mouth every 8 (eight) hours as needed for nausea or vomiting.  . tacrolimus (PROTOPIC) 0.1 % ointment TACROLIMUS, 0.1% (External Ointment) - Historical Medication  two times daily (0.1 %) Active  . VENTOLIN HFA 108 (90 Base) MCG/ACT inhaler INHALE 2 PUFFS BY MOUTH EVERY 4 TO 6 HOURS AS NEEDED FOR SHORTNESS OF BREATH  . zolpidem (AMBIEN) 10 MG tablet Take 1 tablet (10 mg total) by mouth at bedtime as needed for sleep.    Review of Systems  Constitutional: Positive for fatigue.  Eyes: Negative for visual disturbance.  Respiratory: Negative for cough, chest tightness and  shortness of breath.   Cardiovascular: Negative for chest pain, palpitations and leg swelling.  Gastrointestinal: Negative for abdominal pain.  Musculoskeletal: Positive for arthralgias.  Neurological: Positive for dizziness (little dizzy) and headaches. Negative for weakness, light-headedness and numbness.    Social History  Substance Use Topics  . Smoking status: Former Research scientist (life sciences)  . Smokeless tobacco: Never Used     Comment: quit 45  years ago   . Alcohol use No   Objective:   BP (!) 152/90 (BP Location: Left Arm, Patient Position: Sitting, Cuff Size: Normal)   Pulse 62   Temp 98.1 F (36.7 C) (Oral)   Resp 16   Wt 185 lb 9.6 oz (84.2 kg)   BMI 33.95  kg/m   Physical Exam  Constitutional: She appears well-developed and well-nourished. No distress.  Neck: Normal range of motion. Neck supple. No tracheal deviation present. No thyromegaly present.  Cardiovascular: Normal rate, regular rhythm and normal heart sounds.  Exam reveals no gallop and no friction rub.   No murmur heard. Pulmonary/Chest: Effort normal and breath sounds normal. No respiratory distress. She has no wheezes. She has no rales.  Musculoskeletal: She exhibits no edema.  Lymphadenopathy:    She has no cervical adenopathy.  Skin: She is not diaphoretic.  Psychiatric: She has a normal mood and affect. Her behavior is normal. Judgment and thought content normal.  Vitals reviewed.     Assessment & Plan:     1. Essential hypertension Stable. Diagnosis pulled for medication refill. Continue current medical treatment plan. Will check labs as below and f/u pending results. - CBC with Differential - Comprehensive Metabolic Panel (CMET) - amLODipine (NORVASC) 5 MG tablet; TAKE 1 TABLET (5 MG TOTAL) BY MOUTH DAILY.  Dispense: 90 tablet; Refill: 3 - lisinopril (PRINIVIL,ZESTRIL) 40 MG tablet; TAKE 1 TABLET (40 MG TOTAL) BY MOUTH DAILY.  Dispense: 90 tablet; Refill: 3 - Nebivolol HCl 20 MG TABS; Take 1 tablet (20 mg total) by mouth daily.  Dispense: 90 tablet; Refill: 3  2. Hypothyroidism, unspecified hypothyroidism type Stable. Diagnosis pulled for medication refill. Continue current medical treatment plan. Will check labs as below and f/u pending results. - TSH - levothyroxine (SYNTHROID, LEVOTHROID) 88 MCG tablet; Take 1 tablet (88 mcg total) by mouth daily.  Dispense: 90 tablet; Refill: 3  3. Abnormal blood sugar Will check labs as below and f/u pending results. - Comprehensive Metabolic Panel (CMET) - HgB A1c  4. Hypercholesteremia Will check labs as below and f/u pending results. - Lipid Profile  5. Other osteoarthritis involving multiple joints Stable. Diagnosis  pulled for medication refill. Continue current medical treatment plan. - celecoxib (CELEBREX) 200 MG capsule; Take 1 capsule (200 mg total) by mouth daily.  Dispense: 30 capsule; Refill: 5  6. Clinical depression Stable. Diagnosis pulled for medication refill. Continue current medical treatment plan. - diazepam (VALIUM) 10 MG tablet; Take 1 tablet (10 mg total) by mouth as needed.  Dispense: 30 tablet; Refill: 5 - escitalopram (LEXAPRO) 10 MG tablet; Take 1 tablet (10 mg total) by mouth daily.  Dispense: 90 tablet; Refill: 3  7. 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 Diagnosis pulled for medication refill. Continue current medical treatment plan. - EPINEPHrine (EPIPEN 2-PAK) 0.3 mg/0.3 mL IJ SOAJ injection; INJECT AS DIRECTED FOR SEVERE ALLERGIC REACTION  Dispense: 1 Device; Refill: 1  8. Acute allergic otitis media of right ear, recurrence not specified Stable. Diagnosis pulled for medication refill. Continue current medical treatment plan. - fluticasone (FLONASE)  50 MCG/ACT nasal spray; Place 2 sprays into both nostrils daily.  Dispense: 16 g; Refill: 11  9. Neuropathy (HCC) Stable. Diagnosis pulled for medication refill. Continue current medical treatment plan. - gabapentin (NEURONTIN) 300 MG capsule; Take 1 capsule (300 mg total) by mouth 2 (two) times daily.  Dispense: 180 capsule; Refill: 3  10. Insomnia Stable. Diagnosis pulled for medication refill. Continue current medical treatment plan. - zolpidem (AMBIEN) 10 MG tablet; Take 1 tablet (10 mg total) by mouth at bedtime as needed for sleep.  Dispense: 30 tablet; Refill: [place], PA-C  [facility]

## 2016-01-07 DIAGNOSIS — I10 Essential (primary) hypertension: Secondary | ICD-10-CM | POA: Diagnosis not present

## 2016-01-07 DIAGNOSIS — R7309 Other abnormal glucose: Secondary | ICD-10-CM | POA: Diagnosis not present

## 2016-01-07 DIAGNOSIS — E039 Hypothyroidism, unspecified: Secondary | ICD-10-CM | POA: Diagnosis not present

## 2016-01-07 DIAGNOSIS — E7800 Pure hypercholesterolemia, unspecified: Secondary | ICD-10-CM | POA: Diagnosis not present

## 2016-01-08 LAB — CBC WITH DIFFERENTIAL/PLATELET
BASOS ABS: 0.1 10*3/uL (ref 0.0–0.2)
Basos: 1 %
EOS (ABSOLUTE): 0.2 10*3/uL (ref 0.0–0.4)
Eos: 4 %
Hematocrit: 42.1 % (ref 34.0–46.6)
Hemoglobin: 13.7 g/dL (ref 11.1–15.9)
IMMATURE GRANS (ABS): 0 10*3/uL (ref 0.0–0.1)
IMMATURE GRANULOCYTES: 1 %
LYMPHS: 29 %
Lymphocytes Absolute: 1.3 10*3/uL (ref 0.7–3.1)
MCH: 28.8 pg (ref 26.6–33.0)
MCHC: 32.5 g/dL (ref 31.5–35.7)
MCV: 88 fL (ref 79–97)
MONOS ABS: 0.4 10*3/uL (ref 0.1–0.9)
Monocytes: 9 %
NEUTROS ABS: 2.5 10*3/uL (ref 1.4–7.0)
NEUTROS PCT: 56 %
PLATELETS: 195 10*3/uL (ref 150–379)
RBC: 4.76 x10E6/uL (ref 3.77–5.28)
RDW: 13.8 % (ref 12.3–15.4)
WBC: 4.3 10*3/uL (ref 3.4–10.8)

## 2016-01-08 LAB — COMPREHENSIVE METABOLIC PANEL
A/G RATIO: 2 (ref 1.2–2.2)
ALK PHOS: 74 IU/L (ref 39–117)
ALT: 13 IU/L (ref 0–32)
AST: 14 IU/L (ref 0–40)
Albumin: 4.2 g/dL (ref 3.5–4.8)
BILIRUBIN TOTAL: 0.5 mg/dL (ref 0.0–1.2)
BUN/Creatinine Ratio: 21 (ref 12–28)
BUN: 17 mg/dL (ref 8–27)
CHLORIDE: 96 mmol/L (ref 96–106)
CO2: 29 mmol/L (ref 18–29)
Calcium: 9.4 mg/dL (ref 8.7–10.3)
Creatinine, Ser: 0.8 mg/dL (ref 0.57–1.00)
GFR calc non Af Amer: 73 mL/min/{1.73_m2} (ref >59)
GFR, EST AFRICAN AMERICAN: 85 mL/min/{1.73_m2} (ref >59)
GLUCOSE: 107 mg/dL — AB (ref 65–99)
Globulin, Total: 2.1 g/dL (ref 1.5–4.5)
POTASSIUM: 4.8 mmol/L (ref 3.5–5.2)
Sodium: 138 mmol/L (ref 134–144)
TOTAL PROTEIN: 6.3 g/dL (ref 6.0–8.5)

## 2016-01-08 LAB — HEMOGLOBIN A1C
Est. average glucose Bld gHb Est-mCnc: 114 mg/dL
HEMOGLOBIN A1C: 5.6 % (ref 4.8–5.6)

## 2016-01-08 LAB — LIPID PANEL
CHOLESTEROL TOTAL: 260 mg/dL — AB (ref 100–199)
Chol/HDL Ratio: 5.4 ratio units — ABNORMAL HIGH (ref 0.0–4.4)
HDL: 48 mg/dL (ref >39)
LDL Calculated: 169 mg/dL — ABNORMAL HIGH (ref 0–99)
Triglycerides: 216 mg/dL — ABNORMAL HIGH (ref 0–149)
VLDL CHOLESTEROL CAL: 43 mg/dL — AB (ref 5–40)

## 2016-01-08 LAB — TSH: TSH: 3.75 u[IU]/mL (ref 0.450–4.500)

## 2016-01-08 MED ORDER — FENOFIBRATE 145 MG PO TABS
145.0000 mg | ORAL_TABLET | Freq: Every day | ORAL | 1 refills | Status: DC
Start: 2016-01-08 — End: 2016-06-26

## 2016-01-08 NOTE — Addendum Note (Signed)
Addended by: Mar Daring on: 01/08/2016 01:02 PM   Modules accepted: Orders

## 2016-01-10 ENCOUNTER — Encounter: Payer: Self-pay | Admitting: Physician Assistant

## 2016-01-15 ENCOUNTER — Encounter: Payer: Self-pay | Admitting: *Deleted

## 2016-01-16 NOTE — Discharge Instructions (Signed)

## 2016-01-17 ENCOUNTER — Ambulatory Visit: Payer: Medicare Other | Admitting: Anesthesiology

## 2016-01-17 ENCOUNTER — Encounter
Admission: RE | Disposition: A | Discharge status: Home / Self Care | Payer: Self-pay | Source: Ambulatory Visit | Attending: Gastroenterology

## 2016-01-17 ENCOUNTER — Ambulatory Visit
Admission: RE | Admit: 2016-01-17 | Discharge: 2016-01-17 | Disposition: A | Discharge status: Home / Self Care | Payer: Medicare Other | Source: Ambulatory Visit | Attending: Gastroenterology | Admitting: Gastroenterology

## 2016-01-17 DIAGNOSIS — D124 Benign neoplasm of descending colon: Secondary | ICD-10-CM

## 2016-01-17 DIAGNOSIS — H409 Unspecified glaucoma: Secondary | ICD-10-CM | POA: Diagnosis not present

## 2016-01-17 DIAGNOSIS — Z791 Long term (current) use of non-steroidal anti-inflammatories (NSAID): Secondary | ICD-10-CM | POA: Insufficient documentation

## 2016-01-17 DIAGNOSIS — Z860100 Personal history of colon polyps, unspecified: Secondary | ICD-10-CM

## 2016-01-17 DIAGNOSIS — Z8051 Family history of malignant neoplasm of kidney: Secondary | ICD-10-CM | POA: Diagnosis not present

## 2016-01-17 DIAGNOSIS — Z79899 Other long term (current) drug therapy: Secondary | ICD-10-CM | POA: Insufficient documentation

## 2016-01-17 DIAGNOSIS — Z880 Allergy status to penicillin: Secondary | ICD-10-CM | POA: Diagnosis not present

## 2016-01-17 DIAGNOSIS — E039 Hypothyroidism, unspecified: Secondary | ICD-10-CM | POA: Diagnosis not present

## 2016-01-17 DIAGNOSIS — Z7982 Long term (current) use of aspirin: Secondary | ICD-10-CM | POA: Diagnosis not present

## 2016-01-17 DIAGNOSIS — Z87891 Personal history of nicotine dependence: Secondary | ICD-10-CM | POA: Insufficient documentation

## 2016-01-17 DIAGNOSIS — I10 Essential (primary) hypertension: Secondary | ICD-10-CM | POA: Insufficient documentation

## 2016-01-17 DIAGNOSIS — Z8249 Family history of ischemic heart disease and other diseases of the circulatory system: Secondary | ICD-10-CM | POA: Insufficient documentation

## 2016-01-17 DIAGNOSIS — M797 Fibromyalgia: Secondary | ICD-10-CM | POA: Insufficient documentation

## 2016-01-17 DIAGNOSIS — Z1211 Encounter for screening for malignant neoplasm of colon: Secondary | ICD-10-CM | POA: Diagnosis not present

## 2016-01-17 DIAGNOSIS — F329 Major depressive disorder, single episode, unspecified: Secondary | ICD-10-CM | POA: Diagnosis not present

## 2016-01-17 DIAGNOSIS — Z818 Family history of other mental and behavioral disorders: Secondary | ICD-10-CM | POA: Insufficient documentation

## 2016-01-17 DIAGNOSIS — K635 Polyp of colon: Secondary | ICD-10-CM | POA: Insufficient documentation

## 2016-01-17 DIAGNOSIS — Z841 Family history of disorders of kidney and ureter: Secondary | ICD-10-CM | POA: Insufficient documentation

## 2016-01-17 DIAGNOSIS — M1990 Unspecified osteoarthritis, unspecified site: Secondary | ICD-10-CM | POA: Insufficient documentation

## 2016-01-17 DIAGNOSIS — Z8041 Family history of malignant neoplasm of ovary: Secondary | ICD-10-CM | POA: Insufficient documentation

## 2016-01-17 DIAGNOSIS — K642 Third degree hemorrhoids: Secondary | ICD-10-CM | POA: Insufficient documentation

## 2016-01-17 DIAGNOSIS — Z800 Family history of malignant neoplasm of digestive organs: Secondary | ICD-10-CM | POA: Insufficient documentation

## 2016-01-17 DIAGNOSIS — D122 Benign neoplasm of ascending colon: Secondary | ICD-10-CM | POA: Diagnosis not present

## 2016-01-17 DIAGNOSIS — K5730 Diverticulosis of large intestine without perforation or abscess without bleeding: Secondary | ICD-10-CM | POA: Insufficient documentation

## 2016-01-17 DIAGNOSIS — J45909 Unspecified asthma, uncomplicated: Secondary | ICD-10-CM | POA: Diagnosis not present

## 2016-01-17 DIAGNOSIS — Z7951 Long term (current) use of inhaled steroids: Secondary | ICD-10-CM | POA: Diagnosis not present

## 2016-01-17 DIAGNOSIS — Z881 Allergy status to other antibiotic agents status: Secondary | ICD-10-CM | POA: Diagnosis not present

## 2016-01-17 DIAGNOSIS — Z888 Allergy status to other drugs, medicaments and biological substances status: Secondary | ICD-10-CM | POA: Insufficient documentation

## 2016-01-17 HISTORY — DX: Headache: R51

## 2016-01-17 HISTORY — DX: Unspecified osteoarthritis, unspecified site: M19.90

## 2016-01-17 HISTORY — PX: POLYPECTOMY: SHX5525

## 2016-01-17 HISTORY — DX: Headache, unspecified: R51.9

## 2016-01-17 HISTORY — DX: Nausea with vomiting, unspecified: R11.2

## 2016-01-17 HISTORY — DX: Other complications of anesthesia, initial encounter: T88.59XA

## 2016-01-17 HISTORY — DX: Motion sickness, initial encounter: T75.3XXA

## 2016-01-17 HISTORY — PX: COLONOSCOPY WITH PROPOFOL: SHX5780

## 2016-01-17 HISTORY — DX: Other specified postprocedural states: Z98.890

## 2016-01-17 HISTORY — DX: Fibromyalgia: M79.7

## 2016-01-17 HISTORY — DX: Family history of other specified conditions: Z84.89

## 2016-01-17 HISTORY — DX: Adverse effect of unspecified anesthetic, initial encounter: T41.45XA

## 2016-01-17 HISTORY — DX: Unspecified asthma, uncomplicated: J45.909

## 2016-01-17 SURGERY — COLONOSCOPY WITH PROPOFOL
Anesthesia: Monitor Anesthesia Care | Wound class: Contaminated

## 2016-01-17 MED ORDER — LACTATED RINGERS IV SOLN
INTRAVENOUS | Status: DC
  Administered 2016-01-17: 10:00:00 via INTRAVENOUS

## 2016-01-17 MED ORDER — PROPOFOL 10 MG/ML IV BOLUS
INTRAVENOUS | Status: DC | PRN
  Administered 2016-01-17: 50 mg via INTRAVENOUS
  Administered 2016-01-17: 20 mg via INTRAVENOUS
  Administered 2016-01-17: 50 mg via INTRAVENOUS
  Administered 2016-01-17: 20 mg via INTRAVENOUS
  Administered 2016-01-17: 10 mg via INTRAVENOUS
  Administered 2016-01-17: 50 mg via INTRAVENOUS

## 2016-01-17 MED ORDER — LIDOCAINE HCL (CARDIAC) 20 MG/ML IV SOLN
INTRAVENOUS | Status: DC | PRN
  Administered 2016-01-17: 40 mg via INTRAVENOUS

## 2016-01-17 MED ORDER — STERILE WATER FOR IRRIGATION IR SOLN
Status: DC | PRN
  Administered 2016-01-17: 11:00:00

## 2016-01-17 SURGICAL SUPPLY — 23 items
CANISTER SUCT 1200ML W/VALVE (MISCELLANEOUS) ×3 IMPLANT
CLIP HMST 235XBRD CATH ROT (MISCELLANEOUS) IMPLANT
CLIP RESOLUTION 360 11X235 (MISCELLANEOUS)
FCP ESCP3.2XJMB 240X2.8X (MISCELLANEOUS)
FORCEPS BIOP RAD 4 LRG CAP 4 (CUTTING FORCEPS) IMPLANT
FORCEPS BIOP RJ4 240 W/NDL (MISCELLANEOUS)
FORCEPS ESCP3.2XJMB 240X2.8X (MISCELLANEOUS) IMPLANT
GOWN CVR UNV OPN BCK APRN NK (MISCELLANEOUS) ×4 IMPLANT
GOWN ISOL THUMB LOOP REG UNIV (MISCELLANEOUS) ×6
INJECTOR VARIJECT VIN23 (MISCELLANEOUS) IMPLANT
KIT DEFENDO VALVE AND CONN (KITS) IMPLANT
KIT ENDO PROCEDURE OLY (KITS) ×3 IMPLANT
MARKER SPOT ENDO TATTOO 5ML (MISCELLANEOUS) IMPLANT
PAD GROUND ADULT SPLIT (MISCELLANEOUS) IMPLANT
PROBE APC STR FIRE (PROBE) IMPLANT
RETRIEVER NET ROTH 2.5X230 LF (MISCELLANEOUS) ×3 IMPLANT
SNARE SHORT THROW 13M SML OVAL (MISCELLANEOUS) ×1 IMPLANT
SNARE SHORT THROW 30M LRG OVAL (MISCELLANEOUS) IMPLANT
SNARE SNG USE RND 15MM (INSTRUMENTS) IMPLANT
SPOT EX ENDOSCOPIC TATTOO (MISCELLANEOUS)
TRAP ETRAP POLY (MISCELLANEOUS) ×1 IMPLANT
VARIJECT INJECTOR VIN23 (MISCELLANEOUS)
WATER STERILE IRR 250ML POUR (IV SOLUTION) ×3 IMPLANT

## 2016-01-17 NOTE — Anesthesia Preprocedure Evaluation (Signed)
Anesthesia Evaluation    History of Anesthesia Complications (+) PONV and history of anesthetic complications  Airway Mallampati: I  TM Distance: >3 FB Neck ROM: Full    Dental   Pulmonary asthma , former smoker,    Pulmonary exam normal        Cardiovascular hypertension, Pt. on medications Normal cardiovascular exam     Neuro/Psych  Headaches, PSYCHIATRIC DISORDERS Anxiety Depression  Neuromuscular disease    GI/Hepatic   Endo/Other  Hypothyroidism   Renal/GU      Musculoskeletal  (+) Arthritis , Fibromyalgia -  Abdominal   Peds  Hematology   Anesthesia Other Findings   Reproductive/Obstetrics                             Anesthesia Physical Anesthesia Plan  ASA: III  Anesthesia Plan: MAC   Post-op Pain Management:    Induction: Intravenous  Airway Management Planned: Natural Airway  Additional Equipment:   Intra-op Plan:   Post-operative Plan:   Informed Consent: I have reviewed the patients History and Physical, chart, labs and discussed the procedure including the risks, benefits and alternatives for the proposed anesthesia with the patient or authorized representative who has indicated his/her understanding and acceptance.     Plan Discussed with: CRNA  Anesthesia Plan Comments:         Anesthesia Quick Evaluation

## 2016-01-17 NOTE — Anesthesia Postprocedure Evaluation (Signed)
Anesthesia Post Note  Patient: Stacey [name]  Procedure(s) Performed: Procedure(s) (LRB): COLONOSCOPY WITH PROPOFOL (N/A) POLYPECTOMY  Patient location during evaluation: PACU Anesthesia Type: MAC Level of consciousness: awake and alert Pain management: pain level controlled Vital Signs Assessment: post-procedure vital signs reviewed and stable Respiratory status: spontaneous breathing, nonlabored ventilation, respiratory function stable and patient connected to nasal cannula oxygen Cardiovascular status: stable and blood pressure returned to baseline Anesthetic complications: no    Marshell Levan

## 2016-01-17 NOTE — Transfer of Care (Signed)
Immediate Anesthesia Transfer of Care Note  Patient: Stacey [name]  Procedure(s) Performed: Procedure(s): COLONOSCOPY WITH PROPOFOL (N/A) POLYPECTOMY  Patient Location: PACU  Anesthesia Type: MAC  Level of Consciousness: awake, alert  and patient cooperative  Airway and Oxygen Therapy: Patient Spontanous Breathing and Patient connected to supplemental oxygen  Post-op Assessment: Post-op Vital signs reviewed, Patient's Cardiovascular Status Stable, Respiratory Function Stable, Patent Airway and No signs of Nausea or vomiting  Post-op Vital Signs: Reviewed and stable  Complications: No apparent anesthesia complications

## 2016-01-17 NOTE — H&P (Signed)
[name], MD [facility] [address], [place], [place] [address] Phone: [phone] Fax : [phone]  Primary Care Physician:  Mar Daring, PA-C Primary Gastroenterologist:  Dr. Allen Norris  Pre-Procedure History & Physical: HPI:  Stacey [name] is a 75 y.o. female is here for an colonoscopy.   Past Medical History:  Diagnosis Date  . Arthritis   . Asthma   . Cancer ([place])   . Complication of anesthesia   . Depression   . Family history of adverse reaction to anesthesia    most of family - PONV  . Fibromyalgia   . Glaucoma   . Headache    migraines - 1-2x/mo  . Hypertension   . Motion sickness    all moving vehicles  . PONV (postoperative nausea and vomiting)   . Thyroid disease     Past Surgical History:  Procedure Laterality Date  . ABDOMINAL HYSTERECTOMY    . APPENDECTOMY    . BLADDER SURGERY    . CATARACT EXTRACTION W/ INTRAOCULAR LENS  IMPLANT, BILATERAL    . NASAL SINUS SURGERY    . RIGHT OOPHORECTOMY      Prior to Admission medications   Medication Sig Start Date End Date Taking? Authorizing Provider  amLODipine (NORVASC) 5 MG tablet TAKE 1 TABLET (5 MG TOTAL) BY MOUTH DAILY. 01/06/16  Yes Mar Daring, PA-C  aspirin 81 MG tablet ASPIRIN, 81MG  (Oral Tablet Delayed Release)  1 Every Day for 0 days  Quantity: 0.00;  Refills: 0   Ordered :17-November-2010  [name] ;  Started 05-Oct-2006 Active Comments: DX: 401.9 10/05/06  Yes Historical Provider, MD  celecoxib (CELEBREX) 200 MG capsule Take 1 capsule (200 mg total) by mouth daily. 01/06/16  Yes Mar Daring, PA-C  Cholecalciferol (VITAMIN D) 2000 units tablet Take 2,000 Units by mouth daily.   Yes Historical Provider, MD  Cyanocobalamin (B-12) 500 MCG TABS Take by mouth.   Yes Historical Provider, MD  diazepam (VALIUM) 10 MG tablet Take 1 tablet (10 mg total) by mouth as needed. 01/06/16  Yes Clearnce Sorrel Burnette, PA-C  escitalopram (LEXAPRO) 10 MG tablet Take 1 tablet (10 mg total) by  mouth daily. 01/06/16  Yes Clearnce Sorrel Burnette, PA-C  fenofibrate (TRICOR) 145 MG tablet Take 1 tablet (145 mg total) by mouth daily. 01/08/16  Yes Clearnce Sorrel Burnette, PA-C  fluticasone (FLONASE) 50 MCG/ACT nasal spray Place 2 sprays into both nostrils daily. 01/06/16  Yes Clearnce Sorrel Burnette, PA-C  gabapentin (NEURONTIN) 300 MG capsule Take 1 capsule (300 mg total) by mouth 2 (two) times daily. 01/06/16  Yes Clearnce Sorrel Burnette, PA-C  levothyroxine (SYNTHROID, LEVOTHROID) 88 MCG tablet Take 1 tablet (88 mcg total) by mouth daily. 01/06/16  Yes Clearnce Sorrel Burnette, PA-C  lisinopril (PRINIVIL,ZESTRIL) 40 MG tablet TAKE 1 TABLET (40 MG TOTAL) BY MOUTH DAILY. 01/06/16  Yes Clearnce Sorrel Burnette, PA-C  Nebivolol HCl 20 MG TABS Take 1 tablet (20 mg total) by mouth daily. 01/06/16  Yes Clearnce Sorrel Burnette, PA-C  ondansetron (ZOFRAN) 4 MG tablet Take 1 tablet (4 mg total) by mouth every 8 (eight) hours as needed for nausea or vomiting. 12/26/15  Yes Carmon Ginsberg, PA  tacrolimus (PROTOPIC) 0.1 % ointment TACROLIMUS, 0.1% (External Ointment) - Historical Medication  two times daily (0.1 %) Active   Yes Historical Provider, MD  VENTOLIN HFA 108 (90 Base) MCG/ACT inhaler INHALE 2 PUFFS BY MOUTH EVERY 4 TO 6 HOURS AS NEEDED FOR SHORTNESS OF BREATH 10/07/15  Yes  Margarita Rana, MD  zolpidem (AMBIEN) 10 MG tablet Take 1 tablet (10 mg total) by mouth at bedtime as needed for sleep. 01/06/16  Yes Clearnce Sorrel Burnette, PA-C  EPINEPHrine (EPIPEN 2-PAK) 0.3 mg/0.3 mL IJ SOAJ injection INJECT AS DIRECTED FOR SEVERE ALLERGIC REACTION 01/06/16   Mar Daring, PA-C    Allergies as of 12/09/2015 - Review Complete 12/04/2015  Allergen Reaction Noted  . Cephalexin Anaphylaxis 03/01/2014  . Atorvastatin  09/27/2014  . Statins Other (See Comments) 03/01/2014  . Penicillins Hives and Rash 03/01/2014    Family History  Problem Relation Age of Onset  . Alzheimer's disease Mother   . Kidney cancer Mother   . Heart attack Father    . Pancreatic cancer Sister   . Heart attack Brother 61  . Ovarian cancer Sister   . Kidney disease Sister     dialysis  . Cancer Sister     uterin  . Heart attack Brother 78    Social History   Social History  . Marital status: Married    Spouse name: [name]  . Number of children: 3  . Years of education: N/A   Occupational History  . Not on file.   Social History Main Topics  . Smoking status: Former Smoker    Quit date: 05/26/1975  . Smokeless tobacco: Never Used     Comment: quit 45  years ago   . Alcohol use No  . Drug use: No  . Sexual activity: Not on file   Other Topics Concern  . Not on file   Social History Narrative  . No narrative on file    Review of Systems: See HPI, otherwise negative ROS  Physical Exam: BP 136/72   Pulse 60   Temp 98.4 F (36.9 C) (Temporal)   Resp 16   Ht 5\' 2"  (1.575 m)   Wt 174 lb (78.9 kg)   SpO2 97%   BMI 31.83 kg/m  General:   Alert,  pleasant and cooperative in NAD Head:  Normocephalic and atraumatic. Neck:  Supple; no masses or thyromegaly. Lungs:  Clear throughout to auscultation.    Heart:  Regular rate and rhythm. Abdomen:  Soft, nontender and nondistended. Normal bowel sounds, without guarding, and without rebound.   Neurologic:  Alert and  oriented x4;  grossly normal neurologically.  Impression/Plan: ZLATA NGO is here for an colonoscopy to be performed for history of polyps  Risks, benefits, limitations, and alternatives regarding  colonoscopy have been reviewed with the patient.  Questions have been answered.  All parties agreeable.   [name], MD  01/17/2016, 9:58 AM

## 2016-01-17 NOTE — Op Note (Signed)
[facility] Patient Name: Stacey [name] Procedure Date: 01/17/2016 10:27 AM MRN: [id number] Account #: [id number] Date of Birth: 02/17/1941 Admit Type: Outpatient Age: 75 Room: [facility] OR ROOM 01 Gender: Female Note Status: Finalized Procedure:            Colonoscopy Indications:          High risk colon cancer surveillance: Personal history                        of colonic polyps Providers:            [name] MD, MD Referring MD:         Mar Daring (Referring MD) Medicines:            Propofol per Anesthesia Complications:        No immediate complications. Procedure:            Pre-Anesthesia Assessment:                       - Prior to the procedure, a History and Physical was                        performed, and patient medications and allergies were                        reviewed. The patient's tolerance of previous                        anesthesia was also reviewed. The risks and benefits of                        the procedure and the sedation options and risks were                        discussed with the patient. All questions were                        answered, and informed consent was obtained. Prior                        Anticoagulants: The patient has taken no previous                        anticoagulant or antiplatelet agents. ASA Grade                        Assessment: II - A patient with mild systemic disease.                        After reviewing the risks and benefits, the patient was                        deemed in satisfactory condition to undergo the                        procedure.                       After obtaining informed consent, the colonoscope was  passed under direct vision. Throughout the procedure,                        the patient's blood pressure, pulse, and oxygen                        saturations were monitored continuously. The Olympus CF      H180AL colonoscope (S#: [id number]) was introduced through                        the anus and advanced to the the cecum, identified by                        appendiceal orifice and ileocecal valve. The                        colonoscopy was performed without difficulty. The                        patient tolerated the procedure well. The quality of                        the bowel preparation was excellent. Findings:      The perianal and digital rectal examinations were normal.      A 2 mm polyp was found in the ascending colon. The polyp was sessile.       The polyp was removed with a cold snare. Resection was complete, but the       polyp tissue was not retrieved.      A 3 mm polyp was found in the descending colon. The polyp was sessile.       The polyp was removed with a cold snare. Resection and retrieval were       complete.      Non-bleeding internal hemorrhoids were found during retroflexion. The       hemorrhoids were Grade III (internal hemorrhoids that prolapse but       require manual reduction).      A few small-mouthed diverticula were found in the entire colon. Impression:           - One 2 mm polyp in the ascending colon, removed with a                        cold snare. Complete resection. Polyp tissue not                        retrieved.                       - One 3 mm polyp in the descending colon, removed with                        a cold snare. Resected and retrieved.                       - Non-bleeding internal hemorrhoids.                       - Diverticulosis in the entire examined colon. Recommendation:       - Await pathology results. Procedure Code(s):    --- Professional ---  45385, Colonoscopy, flexible; with removal of tumor(s),                        polyp(s), or other lesion(s) by snare technique Diagnosis Code(s):    --- Professional ---                       Z86.010, Personal history of colonic polyps                        D12.2, Benign neoplasm of ascending colon                       D12.4, Benign neoplasm of descending colon CPT copyright 2016 American Medical Association. All rights reserved. The codes documented in this report are preliminary and upon coder review may  be revised to meet current compliance requirements. [name] MD, MD 01/17/2016 10:58:30 AM This report has been signed electronically. Number of Addenda: 0 Note Initiated On: 01/17/2016 10:27 AM Scope Withdrawal Time: 0 hours 8 minutes 13 seconds  Total Procedure Duration: 0 hours 13 minutes 32 seconds       [facility]

## 2016-01-17 NOTE — H&P (Signed)
Stacey Lame, MD [facility] [address], [place], [place] [address] Phone: [phone] Fax : [phone]  Primary Care Physician:  Mar Daring, PA-C Primary Gastroenterologist:  Dr. Allen Norris  Pre-Procedure History & Physical: HPI:  Stacey [name] is a 75 y.o. female is here for a screening colonoscopy.   Past Medical History:  Diagnosis Date  . Arthritis   . Asthma   . Cancer ([place])   . Complication of anesthesia   . Depression   . Family history of adverse reaction to anesthesia    most of family - PONV  . Fibromyalgia   . Glaucoma   . Headache    migraines - 1-2x/mo  . Hypertension   . Motion sickness    all moving vehicles  . PONV (postoperative nausea and vomiting)   . Thyroid disease     Past Surgical History:  Procedure Laterality Date  . ABDOMINAL HYSTERECTOMY    . APPENDECTOMY    . BLADDER SURGERY    . CATARACT EXTRACTION W/ INTRAOCULAR LENS  IMPLANT, BILATERAL    . NASAL SINUS SURGERY    . RIGHT OOPHORECTOMY      Prior to Admission medications   Medication Sig Start Date End Date Taking? Authorizing Provider  amLODipine (NORVASC) 5 MG tablet TAKE 1 TABLET (5 MG TOTAL) BY MOUTH DAILY. 01/06/16  Yes Mar Daring, PA-C  aspirin 81 MG tablet ASPIRIN, 81MG  (Oral Tablet Delayed Release)  1 Every Day for 0 days  Quantity: 0.00;  Refills: 0   Ordered :17-November-2010  [name] ;  Started 05-Oct-2006 Active Comments: DX: 401.9 10/05/06  Yes Historical Provider, MD  celecoxib (CELEBREX) 200 MG capsule Take 1 capsule (200 mg total) by mouth daily. 01/06/16  Yes Mar Daring, PA-C  Cholecalciferol (VITAMIN D) 2000 units tablet Take 2,000 Units by mouth daily.   Yes Historical Provider, MD  Cyanocobalamin (B-12) 500 MCG TABS Take by mouth.   Yes Historical Provider, MD  diazepam (VALIUM) 10 MG tablet Take 1 tablet (10 mg total) by mouth as needed. 01/06/16  Yes Clearnce Sorrel Burnette, PA-C  escitalopram (LEXAPRO) 10 MG tablet Take 1 tablet (10 mg  total) by mouth daily. 01/06/16  Yes Clearnce Sorrel Burnette, PA-C  fenofibrate (TRICOR) 145 MG tablet Take 1 tablet (145 mg total) by mouth daily. 01/08/16  Yes Clearnce Sorrel Burnette, PA-C  fluticasone (FLONASE) 50 MCG/ACT nasal spray Place 2 sprays into both nostrils daily. 01/06/16  Yes Clearnce Sorrel Burnette, PA-C  gabapentin (NEURONTIN) 300 MG capsule Take 1 capsule (300 mg total) by mouth 2 (two) times daily. 01/06/16  Yes Clearnce Sorrel Burnette, PA-C  levothyroxine (SYNTHROID, LEVOTHROID) 88 MCG tablet Take 1 tablet (88 mcg total) by mouth daily. 01/06/16  Yes Clearnce Sorrel Burnette, PA-C  lisinopril (PRINIVIL,ZESTRIL) 40 MG tablet TAKE 1 TABLET (40 MG TOTAL) BY MOUTH DAILY. 01/06/16  Yes Clearnce Sorrel Burnette, PA-C  Nebivolol HCl 20 MG TABS Take 1 tablet (20 mg total) by mouth daily. 01/06/16  Yes Clearnce Sorrel Burnette, PA-C  ondansetron (ZOFRAN) 4 MG tablet Take 1 tablet (4 mg total) by mouth every 8 (eight) hours as needed for nausea or vomiting. 12/26/15  Yes Carmon Ginsberg, PA  tacrolimus (PROTOPIC) 0.1 % ointment TACROLIMUS, 0.1% (External Ointment) - Historical Medication  two times daily (0.1 %) Active   Yes Historical Provider, MD  VENTOLIN HFA 108 (90 Base) MCG/ACT inhaler INHALE 2 PUFFS BY MOUTH EVERY 4 TO 6 HOURS AS NEEDED FOR SHORTNESS OF BREATH 10/07/15  Yes Margarita Rana, MD  zolpidem (AMBIEN) 10 MG tablet Take 1 tablet (10 mg total) by mouth at bedtime as needed for sleep. 01/06/16  Yes Clearnce Sorrel Burnette, PA-C  EPINEPHrine (EPIPEN 2-PAK) 0.3 mg/0.3 mL IJ SOAJ injection INJECT AS DIRECTED FOR SEVERE ALLERGIC REACTION 01/06/16   Mar Daring, PA-C    Allergies as of 12/09/2015 - Review Complete 12/04/2015  Allergen Reaction Noted  . Cephalexin Anaphylaxis 03/01/2014  . Atorvastatin  09/27/2014  . Statins Other (See Comments) 03/01/2014  . Penicillins Hives and Rash 03/01/2014    Family History  Problem Relation Age of Onset  . Alzheimer's disease Mother   . Kidney cancer Mother   . Heart  attack Father   . Pancreatic cancer Sister   . Heart attack Brother 102  . Ovarian cancer Sister   . Kidney disease Sister     dialysis  . Cancer Sister     uterin  . Heart attack Brother 43    Social History   Social History  . Marital status: Married    Spouse name: [name]  . Number of children: 3  . Years of education: N/A   Occupational History  . Not on file.   Social History Main Topics  . Smoking status: Former Smoker    Quit date: 05/26/1975  . Smokeless tobacco: Never Used     Comment: quit 45  years ago   . Alcohol use No  . Drug use: No  . Sexual activity: Not on file   Other Topics Concern  . Not on file   Social History Narrative  . No narrative on file    Review of Systems: See HPI, otherwise negative ROS  Physical Exam: BP 136/72   Pulse 60   Temp 98.4 F (36.9 C) (Temporal)   Resp 16   Ht 5\' 2"  (1.575 m)   Wt 174 lb (78.9 kg)   SpO2 97%   BMI 31.83 kg/m  General:   Alert,  pleasant and cooperative in NAD Head:  Normocephalic and atraumatic. Neck:  Supple; no masses or thyromegaly. Lungs:  Clear throughout to auscultation.    Heart:  Regular rate and rhythm. Abdomen:  Soft, nontender and nondistended. Normal bowel sounds, without guarding, and without rebound.   Neurologic:  Alert and  oriented x4;  grossly normal neurologically.  Impression/Plan: [name] is now here to undergo a screening colonoscopy.  Risks, benefits, and alternatives regarding colonoscopy have been reviewed with the patient.  Questions have been answered.  All parties agreeable.

## 2016-01-20 ENCOUNTER — Encounter: Payer: Self-pay | Admitting: Gastroenterology

## 2016-01-21 ENCOUNTER — Encounter: Payer: Self-pay | Admitting: Gastroenterology

## 2016-01-22 ENCOUNTER — Encounter: Payer: Self-pay | Admitting: Gastroenterology

## 2016-02-19 DIAGNOSIS — Z23 Encounter for immunization: Secondary | ICD-10-CM | POA: Diagnosis not present

## 2016-06-10 ENCOUNTER — Other Ambulatory Visit: Payer: Self-pay | Admitting: Family Medicine

## 2016-06-10 DIAGNOSIS — M158 Other polyosteoarthritis: Secondary | ICD-10-CM

## 2016-06-12 NOTE — Telephone Encounter (Signed)
Please review-aa 

## 2016-06-18 ENCOUNTER — Other Ambulatory Visit: Payer: Self-pay | Admitting: Family Medicine

## 2016-06-18 DIAGNOSIS — J111 Influenza due to unidentified influenza virus with other respiratory manifestations: Secondary | ICD-10-CM

## 2016-06-18 MED ORDER — OSELTAMIVIR PHOSPHATE 75 MG PO CAPS: 75.0000 mg | ORAL_CAPSULE | Freq: Two times a day (BID) | ORAL | 0 refills | Status: DC

## 2016-06-26 ENCOUNTER — Other Ambulatory Visit: Payer: Self-pay | Admitting: Physician Assistant

## 2016-06-26 DIAGNOSIS — E7800 Pure hypercholesterolemia, unspecified: Secondary | ICD-10-CM

## 2016-08-13 ENCOUNTER — Other Ambulatory Visit: Payer: Self-pay

## 2016-08-13 DIAGNOSIS — M158 Other polyosteoarthritis: Secondary | ICD-10-CM

## 2016-08-13 MED ORDER — CELECOXIB 200 MG PO CAPS: 200.0000 mg | ORAL_CAPSULE | Freq: Every day | ORAL | 5 refills | Status: DC

## 2016-08-13 NOTE — Telephone Encounter (Signed)
[facility] sent fax requesting that we change the sig to one capsule daily, as this is how the patient is now taking the medication. Renaldo Fiddler, CMA

## 2016-08-23 DIAGNOSIS — M25562 Pain in left knee: Secondary | ICD-10-CM | POA: Diagnosis not present

## 2016-08-23 DIAGNOSIS — M1380 Other specified arthritis, unspecified site: Secondary | ICD-10-CM | POA: Diagnosis not present

## 2016-08-24 ENCOUNTER — Ambulatory Visit
Admission: RE | Admit: 2016-08-24 | Discharge: 2016-08-24 | Disposition: A | Discharge status: Home / Self Care | Payer: Medicare Other | Source: Ambulatory Visit | Attending: Family Medicine | Admitting: Family Medicine

## 2016-08-24 ENCOUNTER — Encounter: Payer: Self-pay | Admitting: Family Medicine

## 2016-08-24 ENCOUNTER — Ambulatory Visit (INDEPENDENT_AMBULATORY_CARE_PROVIDER_SITE_OTHER): Payer: Medicare Other | Admitting: Family Medicine

## 2016-08-24 VITALS — BP 132/80 | HR 64 | Temp 98.3°F | Resp 16

## 2016-08-24 DIAGNOSIS — M158 Other polyosteoarthritis: Secondary | ICD-10-CM | POA: Diagnosis not present

## 2016-08-24 DIAGNOSIS — M1712 Unilateral primary osteoarthritis, left knee: Secondary | ICD-10-CM | POA: Diagnosis not present

## 2016-08-24 DIAGNOSIS — M25562 Pain in left knee: Secondary | ICD-10-CM | POA: Diagnosis not present

## 2016-08-24 NOTE — Progress Notes (Signed)
Patient: Stacey [name] Female    DOB: Dec 06, 1940   76 y.o.   MRN: [id number] Visit Date: 08/24/2016  Today's Provider: Vernie Murders, PA   Chief Complaint  Patient presents with  . Knee Pain   Subjective:    Knee Pain   There was no injury mechanism. The pain is present in the left knee (x 4 weeks). The quality of the pain is described as stabbing and shooting. The pain is at a severity of 5/10 (pt reports the pain can get as high as an 8/10). The pain is moderate. The pain has been fluctuating since onset. Associated symptoms include an inability to bear weight, numbness and tingling. Pertinent negatives include no loss of motion or muscle weakness. The symptoms are aggravated by weight bearing (pt went to beach last weekend, and pt believes walking through the sand caused the pain to worsen). Treatments tried: Celebrex 200 mg three times daily, ibuprofen 800 mg TID. The treatment provided no relief.   Patient Active Problem List   Diagnosis Date Noted  . Personal history of colonic polyps   . Benign neoplasm of ascending colon   . Benign neoplasm of descending colon   . Chest pain 09/16/2015  . Disequilibrium 09/16/2015  . Panic attacks 09/11/2015  . Ganglion cyst 08/16/2015  . Osteoarthritis of ankle and foot 08/16/2015  . Hypernatremia 07/04/2015  . Upper back pain 07/04/2015  . Other osteoarthritis involving multiple joints 02/06/2015  . Adult BMI 30+ 09/27/2014  . Clinical depression 09/27/2014  . Dry mouth 09/27/2014  . Accumulation of fluid in tissues 09/27/2014  . Fibrositis 09/27/2014  . Cephalalgia 09/27/2014  . LBP (low back pain) 09/27/2014  . Screening for osteoporosis 09/27/2014  . Avitaminosis D 09/27/2014  . Decreased leukocytes 09/27/2014  . History of colon polyps 11/14/2009  . Abnormal blood sugar 05/03/2009  . Insomnia 09/29/2007  . BP (high blood pressure) 03/09/2007  . Adaptation reaction 01/29/2007  . Acid reflux 10/07/2006  .  Menopausal and postmenopausal disorder 10/05/2006  . Headache, migraine 10/05/2006  . Arthritis, degenerative 03/23/1994  . Adult hypothyroidism 04/15/1993  . Hypercholesteremia 03/25/1993   Past Surgical History:  Procedure Laterality Date  . ABDOMINAL HYSTERECTOMY    . APPENDECTOMY    . BLADDER SURGERY    . CATARACT EXTRACTION W/ INTRAOCULAR LENS  IMPLANT, BILATERAL    . COLONOSCOPY WITH PROPOFOL N/A 01/17/2016   Procedure: COLONOSCOPY WITH PROPOFOL;  Surgeon: [name], MD;  Location: [facility];  Service: Endoscopy;  Laterality: N/A;  . NASAL SINUS SURGERY    . POLYPECTOMY  01/17/2016   Procedure: POLYPECTOMY;  Surgeon: [name], MD;  Location: [facility];  Service: Endoscopy;;  . RIGHT OOPHORECTOMY     Family History  Problem Relation Age of Onset  . Alzheimer's disease Mother   . Kidney cancer Mother   . Heart attack Father   . Pancreatic cancer Sister   . Heart attack Brother 22  . Ovarian cancer Sister   . Kidney disease Sister     dialysis  . Cancer Sister     uterin  . Heart attack Brother 53   Past Medical History:  Diagnosis Date  . Arthritis   . Asthma   . Cancer ([place])   . Complication of anesthesia   . Depression   . Family history of adverse reaction to anesthesia    most of family - PONV  . Fibromyalgia   . Glaucoma   .  Headache    migraines - 1-2x/mo  . Hypertension   . Motion sickness    all moving vehicles  . PONV (postoperative nausea and vomiting)   . Thyroid disease     Allergies  Allergen Reactions  . Cephalexin Anaphylaxis  . Atorvastatin   . Novocain [Procaine]     Chest pain  . Shellfish Allergy Swelling    angioedema  . Statins Other (See Comments)    Muscle pain  . Tomato     (by testing) - also beans, wheat, peas  . Penicillins Hives and Rash     Current Outpatient Prescriptions:  .  amLODipine (NORVASC) 5 MG tablet, TAKE 1 TABLET (5 MG TOTAL) BY MOUTH DAILY., Disp: 90 tablet, Rfl: 3 .  aspirin 81  MG tablet, ASPIRIN, 81MG  (Oral Tablet Delayed Release)  1 Every Day for 0 days  Quantity: 0.00;  Refills: 0   Ordered :17-November-2010  [name] ;  Started 05-Oct-2006 Active Comments: DX: 401.9, Disp: , Rfl:  .  celecoxib (CELEBREX) 200 MG capsule, Take 1 capsule (200 mg total) by mouth daily. (Patient taking differently: Take 200 mg by mouth 3 (three) times daily. ), Disp: 30 capsule, Rfl: 5 .  Cholecalciferol (VITAMIN D) 2000 units tablet, Take 2,000 Units by mouth daily., Disp: , Rfl:  .  Cyanocobalamin (B-12) 500 MCG TABS, Take by mouth., Disp: , Rfl:  .  diazepam (VALIUM) 10 MG tablet, Take 1 tablet (10 mg total) by mouth as needed., Disp: 30 tablet, Rfl: 5 .  EPINEPHrine (EPIPEN 2-PAK) 0.3 mg/0.3 mL IJ SOAJ injection, INJECT AS DIRECTED FOR SEVERE ALLERGIC REACTION, Disp: 1 Device, Rfl: 1 .  escitalopram (LEXAPRO) 10 MG tablet, Take 1 tablet (10 mg total) by mouth daily., Disp: 90 tablet, Rfl: 3 .  fenofibrate (TRICOR) 145 MG tablet, TAKE 1 TABLET (145 MG TOTAL) BY MOUTH DAILY., Disp: 90 tablet, Rfl: 1 .  fluticasone (FLONASE) 50 MCG/ACT nasal spray, Place 2 sprays into both nostrils daily., Disp: 16 g, Rfl: 11 .  gabapentin (NEURONTIN) 300 MG capsule, Take 1 capsule (300 mg total) by mouth 2 (two) times daily., Disp: 180 capsule, Rfl: 3 .  levothyroxine (SYNTHROID, LEVOTHROID) 88 MCG tablet, Take 1 tablet (88 mcg total) by mouth daily., Disp: 90 tablet, Rfl: 3 .  lisinopril (PRINIVIL,ZESTRIL) 40 MG tablet, TAKE 1 TABLET (40 MG TOTAL) BY MOUTH DAILY., Disp: 90 tablet, Rfl: 3 .  Nebivolol HCl 20 MG TABS, Take 1 tablet (20 mg total) by mouth daily., Disp: 90 tablet, Rfl: 3 .  ondansetron (ZOFRAN) 4 MG tablet, Take 1 tablet (4 mg total) by mouth every 8 (eight) hours as needed for nausea or vomiting., Disp: 12 tablet, Rfl: 0 .  tacrolimus (PROTOPIC) 0.1 % ointment, TACROLIMUS, 0.1% (External Ointment) - Historical Medication  two times daily (0.1 %) Active, Disp: , Rfl:  .  VENTOLIN HFA 108 (90  Base) MCG/ACT inhaler, INHALE 2 PUFFS BY MOUTH EVERY 4 TO 6 HOURS AS NEEDED FOR SHORTNESS OF BREATH, Disp: 18 g, Rfl: 5 .  zolpidem (AMBIEN) 10 MG tablet, Take 1 tablet (10 mg total) by mouth at bedtime as needed for sleep., Disp: 30 tablet, Rfl: 5  Review of Systems  Constitutional: Negative.   HENT: Negative.   Respiratory: Negative.   Cardiovascular: Negative.   Genitourinary: Negative.   Musculoskeletal: Positive for arthralgias, gait problem and joint swelling.       Pain in left knee. Slight swelling medially.  Neurological: Positive for tingling and numbness.  Secondary to fibromyalgia    Social History  Substance Use Topics  . Smoking status: Former Smoker    Quit date: 05/26/1975  . Smokeless tobacco: Never Used     Comment: quit 45  years ago   . Alcohol use No   Objective:   BP 132/80 (BP Location: Right Arm, Patient Position: Sitting, Cuff Size: Large)   Pulse 64   Temp 98.3 F (36.8 C) (Oral)   Resp 16  Vitals:   08/24/16 1537  BP: 132/80  Pulse: 64  Resp: 16  Temp: 98.3 F (36.8 C)  TempSrc: Oral   Physical Exam  Constitutional: She is oriented to person, place, and time. She appears well-developed and well-nourished. No distress.  HENT:  Head: Normocephalic and atraumatic.  Right Ear: Hearing normal.  Left Ear: Hearing normal.  Nose: Nose normal.  Eyes: Conjunctivae and lids are normal. Right eye exhibits no discharge. Left eye exhibits no discharge. No scleral icterus.  Pulmonary/Chest: Effort normal. No respiratory distress.  Musculoskeletal: She exhibits tenderness.  Left medial knee with some crepitus. Shape pain to McMurray test medial knee. Good pulses.  Neurological: She is alert and oriented to person, place, and time.  Skin: Skin is intact. No lesion and no rash noted.  Psychiatric: She has a normal mood and affect. Her speech is normal and behavior is normal. Thought content normal.      Assessment & Plan:     1. Acute pain of left  knee Onset the past month with a throbbing and occasional sharp pain. Worse with first standing or fully extending the left knee. Some medial swelling without erythema. Crepitus mild to moderate. Tried Celebrex and Ibuprofen TID EACH and felt abdominal pain. Only did that for 2 days and had to stop. Stomach better with Zegerid BID. Discontinue oral NSAID and try Aspercreme with Lidocaine. Get x-ray evaluation. May need referral to orthopedist for cortisone injection or MRI. - DG Knee Complete 4 Views Left  2. Other osteoarthritis involving multiple joints  Aches and pains in hands and C-spine. X-rays of C-spine confirm degenerative disease in 2015. History of fibromyalgia in shoulders and upper back. Unable to tolerate opiates due to nausea and vomiting. Usually some relief from NSAID.     Patient seen and examined by Vernie Murders, PA, and note scribed by Renaldo Fiddler, CMA.  Vernie Murders, PA  [facility]

## 2016-08-25 ENCOUNTER — Other Ambulatory Visit: Payer: Self-pay | Admitting: Family Medicine

## 2016-08-25 ENCOUNTER — Telehealth: Payer: Self-pay

## 2016-08-25 DIAGNOSIS — M25562 Pain in left knee: Secondary | ICD-10-CM

## 2016-08-25 DIAGNOSIS — S83242A Other tear of medial meniscus, current injury, left knee, initial encounter: Secondary | ICD-10-CM

## 2016-08-25 NOTE — Telephone Encounter (Signed)
-----   Message from Margo Common, [place] sent at 08/24/2016  6:28 PM EDT ----- Mild degenerative changes of the left knee. Some narrowing of the joint space and patellofemoral compartment. Questionable free floating calcified body in the medial joint. Recommend MRI and/or orthopedic referral.

## 2016-08-25 NOTE — Telephone Encounter (Signed)
Patient advised of results and would like to proceed to having MRI. Please place order for MRI or tell me which type you would like to order and the associated dx code.

## 2016-08-25 NOTE — Telephone Encounter (Signed)
Please schedule. Thanks! 

## 2016-08-25 NOTE — Telephone Encounter (Signed)
MRI scan of left knee to access meniscal tear and calcified body. Placed MRI order in as a future order to be released when scheduled.

## 2016-08-27 ENCOUNTER — Other Ambulatory Visit: Payer: Self-pay | Admitting: Physician Assistant

## 2016-08-27 DIAGNOSIS — G4700 Insomnia, unspecified: Secondary | ICD-10-CM

## 2016-08-27 NOTE — Telephone Encounter (Signed)
Done. Prescription called into the pharmacy.  

## 2016-09-04 ENCOUNTER — Ambulatory Visit
Admission: RE | Admit: 2016-09-04 | Discharge: 2016-09-04 | Disposition: A | Discharge status: Home / Self Care | Payer: Medicare Other | Source: Ambulatory Visit | Attending: Family Medicine | Admitting: Family Medicine

## 2016-09-04 ENCOUNTER — Other Ambulatory Visit: Payer: Self-pay | Admitting: Family Medicine

## 2016-09-04 DIAGNOSIS — M25562 Pain in left knee: Secondary | ICD-10-CM

## 2016-09-04 DIAGNOSIS — X58XXXA Exposure to other specified factors, initial encounter: Secondary | ICD-10-CM | POA: Insufficient documentation

## 2016-09-04 DIAGNOSIS — S83232A Complex tear of medial meniscus, current injury, left knee, initial encounter: Secondary | ICD-10-CM | POA: Diagnosis not present

## 2016-09-04 DIAGNOSIS — S83242A Other tear of medial meniscus, current injury, left knee, initial encounter: Secondary | ICD-10-CM

## 2016-09-04 DIAGNOSIS — M949 Disorder of cartilage, unspecified: Secondary | ICD-10-CM | POA: Diagnosis not present

## 2016-09-07 NOTE — Progress Notes (Signed)
Advised  ED 

## 2016-09-09 DIAGNOSIS — M25562 Pain in left knee: Secondary | ICD-10-CM | POA: Insufficient documentation

## 2016-09-09 DIAGNOSIS — M1712 Unilateral primary osteoarthritis, left knee: Secondary | ICD-10-CM | POA: Diagnosis not present

## 2016-09-09 DIAGNOSIS — S83222A Peripheral tear of medial meniscus, current injury, left knee, initial encounter: Secondary | ICD-10-CM | POA: Diagnosis not present

## 2016-10-01 DIAGNOSIS — S83222A Peripheral tear of medial meniscus, current injury, left knee, initial encounter: Secondary | ICD-10-CM | POA: Diagnosis not present

## 2016-10-01 DIAGNOSIS — M25562 Pain in left knee: Secondary | ICD-10-CM | POA: Diagnosis not present

## 2016-10-26 DIAGNOSIS — L570 Actinic keratosis: Secondary | ICD-10-CM | POA: Diagnosis not present

## 2016-10-26 DIAGNOSIS — D1800 Hemangioma unspecified site: Secondary | ICD-10-CM | POA: Diagnosis not present

## 2016-10-26 DIAGNOSIS — L578 Other skin changes due to chronic exposure to nonionizing radiation: Secondary | ICD-10-CM | POA: Diagnosis not present

## 2016-10-26 DIAGNOSIS — Z1283 Encounter for screening for malignant neoplasm of skin: Secondary | ICD-10-CM | POA: Diagnosis not present

## 2016-10-26 DIAGNOSIS — L812 Freckles: Secondary | ICD-10-CM | POA: Diagnosis not present

## 2016-10-26 DIAGNOSIS — Z85828 Personal history of other malignant neoplasm of skin: Secondary | ICD-10-CM | POA: Diagnosis not present

## 2016-10-26 DIAGNOSIS — L821 Other seborrheic keratosis: Secondary | ICD-10-CM | POA: Diagnosis not present

## 2016-10-26 DIAGNOSIS — I8393 Asymptomatic varicose veins of bilateral lower extremities: Secondary | ICD-10-CM | POA: Diagnosis not present

## 2016-10-27 ENCOUNTER — Other Ambulatory Visit: Payer: Self-pay | Admitting: Physician Assistant

## 2016-10-27 DIAGNOSIS — I10 Essential (primary) hypertension: Secondary | ICD-10-CM

## 2016-10-27 NOTE — Telephone Encounter (Signed)
Has AWV/FU 12/17/2016. Renaldo Fiddler, CMA

## 2016-12-14 DIAGNOSIS — Z85828 Personal history of other malignant neoplasm of skin: Secondary | ICD-10-CM | POA: Diagnosis not present

## 2016-12-14 DIAGNOSIS — L578 Other skin changes due to chronic exposure to nonionizing radiation: Secondary | ICD-10-CM | POA: Diagnosis not present

## 2016-12-14 DIAGNOSIS — L570 Actinic keratosis: Secondary | ICD-10-CM | POA: Diagnosis not present

## 2016-12-17 ENCOUNTER — Ambulatory Visit (INDEPENDENT_AMBULATORY_CARE_PROVIDER_SITE_OTHER): Payer: Medicare Other

## 2016-12-17 ENCOUNTER — Ambulatory Visit (INDEPENDENT_AMBULATORY_CARE_PROVIDER_SITE_OTHER): Payer: Medicare Other | Admitting: Physician Assistant

## 2016-12-17 VITALS — BP 172/88 | HR 64 | Temp 98.8°F | Ht 62.0 in | Wt 180.4 lb

## 2016-12-17 VITALS — BP 168/72

## 2016-12-17 DIAGNOSIS — H65111 Acute and subacute allergic otitis media (mucoid) (sanguinous) (serous), right ear: Secondary | ICD-10-CM

## 2016-12-17 DIAGNOSIS — F5101 Primary insomnia: Secondary | ICD-10-CM | POA: Diagnosis not present

## 2016-12-17 DIAGNOSIS — G629 Polyneuropathy, unspecified: Secondary | ICD-10-CM | POA: Diagnosis not present

## 2016-12-17 DIAGNOSIS — M158 Other polyosteoarthritis: Secondary | ICD-10-CM

## 2016-12-17 DIAGNOSIS — T7801XA Anaphylactic reaction due to peanuts, initial encounter: Secondary | ICD-10-CM

## 2016-12-17 DIAGNOSIS — R7309 Other abnormal glucose: Secondary | ICD-10-CM

## 2016-12-17 DIAGNOSIS — E039 Hypothyroidism, unspecified: Secondary | ICD-10-CM

## 2016-12-17 DIAGNOSIS — Z0000 Encounter for general adult medical examination without abnormal findings: Secondary | ICD-10-CM | POA: Diagnosis not present

## 2016-12-17 DIAGNOSIS — F3342 Major depressive disorder, recurrent, in full remission: Secondary | ICD-10-CM

## 2016-12-17 DIAGNOSIS — E7800 Pure hypercholesterolemia, unspecified: Secondary | ICD-10-CM

## 2016-12-17 DIAGNOSIS — I10 Essential (primary) hypertension: Secondary | ICD-10-CM

## 2016-12-17 DIAGNOSIS — J4520 Mild intermittent asthma, uncomplicated: Secondary | ICD-10-CM

## 2016-12-17 MED ORDER — ZOLPIDEM TARTRATE 10 MG PO TABS: 10.0000 mg | ORAL_TABLET | Freq: Every evening | ORAL | 1 refills | Status: DC | PRN

## 2016-12-17 MED ORDER — FENOFIBRATE 145 MG PO TABS: 145.0000 mg | ORAL_TABLET | Freq: Every day | ORAL | 3 refills | Status: DC

## 2016-12-17 MED ORDER — EPINEPHRINE 0.3 MG/0.3ML IJ SOAJ: INTRAMUSCULAR | 1 refills | Status: AC

## 2016-12-17 MED ORDER — LEVOTHYROXINE SODIUM 88 MCG PO TABS: 88.0000 ug | ORAL_TABLET | Freq: Every day | ORAL | 3 refills | Status: DC

## 2016-12-17 MED ORDER — ALBUTEROL SULFATE HFA 108 (90 BASE) MCG/ACT IN AERS: INHALATION_SPRAY | RESPIRATORY_TRACT | 5 refills | Status: DC

## 2016-12-17 MED ORDER — GABAPENTIN 300 MG PO CAPS: 300.0000 mg | ORAL_CAPSULE | Freq: Three times a day (TID) | ORAL | 3 refills | Status: DC

## 2016-12-17 MED ORDER — LISINOPRIL 40 MG PO TABS: ORAL_TABLET | ORAL | 3 refills | Status: DC

## 2016-12-17 MED ORDER — FLUTICASONE PROPIONATE 50 MCG/ACT NA SUSP: 2.0000 | Freq: Every day | NASAL | 11 refills | Status: DC

## 2016-12-17 MED ORDER — CELECOXIB 200 MG PO CAPS: 200.0000 mg | ORAL_CAPSULE | Freq: Three times a day (TID) | ORAL | 3 refills | Status: DC

## 2016-12-17 MED ORDER — ESCITALOPRAM OXALATE 10 MG PO TABS: 10.0000 mg | ORAL_TABLET | Freq: Every day | ORAL | 3 refills | Status: DC

## 2016-12-17 MED ORDER — DIAZEPAM 10 MG PO TABS: 10.0000 mg | ORAL_TABLET | ORAL | 1 refills | Status: DC | PRN

## 2016-12-17 NOTE — Patient Instructions (Signed)
Health Maintenance for Postmenopausal Women Menopause is a normal process in which your reproductive ability comes to an end. This process happens gradually over a span of months to years, usually between the ages of 22 and 9. Menopause is complete when you have missed 12 consecutive menstrual periods. It is important to talk with your health care provider about some of the most common conditions that affect postmenopausal women, such as heart disease, cancer, and bone loss (osteoporosis). Adopting a healthy lifestyle and getting preventive care can help to promote your health and wellness. Those actions can also lower your chances of developing some of these common conditions. What should I know about menopause? During menopause, you may experience a number of symptoms, such as:  Moderate-to-severe hot flashes.  Night sweats.  Decrease in sex drive.  Mood swings.  Headaches.  Tiredness.  Irritability.  Memory problems.  Insomnia.  Choosing to treat or not to treat menopausal changes is an individual decision that you make with your health care provider. What should I know about hormone replacement therapy and supplements? Hormone therapy products are effective for treating symptoms that are associated with menopause, such as hot flashes and night sweats. Hormone replacement carries certain risks, especially as you become older. If you are thinking about using estrogen or estrogen with progestin treatments, discuss the benefits and risks with your health care provider. What should I know about heart disease and stroke? Heart disease, heart attack, and stroke become more likely as you age. This may be due, in part, to the hormonal changes that your body experiences during menopause. These can affect how your body processes dietary fats, triglycerides, and cholesterol. Heart attack and stroke are both medical emergencies. There are many things that you can do to help prevent heart disease  and stroke:  Have your blood pressure checked at least every 1-2 years. High blood pressure causes heart disease and increases the risk of stroke.  If you are 53-22 years old, ask your health care provider if you should take aspirin to prevent a heart attack or a stroke.  Do not use any tobacco products, including cigarettes, chewing tobacco, or electronic cigarettes. If you need help quitting, ask your health care provider.  It is important to eat a healthy diet and maintain a healthy weight. ? Be sure to include plenty of vegetables, fruits, low-fat dairy products, and lean protein. ? Avoid eating foods that are high in solid fats, added sugars, or salt (sodium).  Get regular exercise. This is one of the most important things that you can do for your health. ? Try to exercise for at least 150 minutes each week. The type of exercise that you do should increase your heart rate and make you sweat. This is known as moderate-intensity exercise. ? Try to do strengthening exercises at least twice each week. Do these in addition to the moderate-intensity exercise.  Know your numbers.Ask your health care provider to check your cholesterol and your blood glucose. Continue to have your blood tested as directed by your health care provider.  What should I know about cancer screening? There are several types of cancer. Take the following steps to reduce your risk and to catch any cancer development as early as possible. Breast Cancer  Practice breast self-awareness. ? This means understanding how your breasts normally appear and feel. ? It also means doing regular breast self-exams. Let your health care provider know about any changes, no matter how small.  If you are 40  or older, have a clinician do a breast exam (clinical breast exam or CBE) every year. Depending on your age, family history, and medical history, it may be recommended that you also have a yearly breast X-ray (mammogram).  If you  have a family history of breast cancer, talk with your health care provider about genetic screening.  If you are at high risk for breast cancer, talk with your health care provider about having an MRI and a mammogram every year.  Breast cancer (BRCA) gene test is recommended for women who have family members with BRCA-related cancers. Results of the assessment will determine the need for genetic counseling and BRCA1 and for BRCA2 testing. BRCA-related cancers include these types: ? Breast. This occurs in males or females. ? Ovarian. ? Tubal. This may also be called fallopian tube cancer. ? Cancer of the abdominal or pelvic lining (peritoneal cancer). ? Prostate. ? Pancreatic.  Cervical, Uterine, and Ovarian Cancer Your health care provider may recommend that you be screened regularly for cancer of the pelvic organs. These include your ovaries, uterus, and vagina. This screening involves a pelvic exam, which includes checking for microscopic changes to the surface of your cervix (Pap test).  For women ages 21-65, health care providers may recommend a pelvic exam and a Pap test every three years. For women ages 79-65, they may recommend the Pap test and pelvic exam, combined with testing for human papilloma virus (HPV), every five years. Some types of HPV increase your risk of cervical cancer. Testing for HPV may also be done on women of any age who have unclear Pap test results.  Other health care providers may not recommend any screening for nonpregnant women who are considered low risk for pelvic cancer and have no symptoms. Ask your health care provider if a screening pelvic exam is right for you.  If you have had past treatment for cervical cancer or a condition that could lead to cancer, you need Pap tests and screening for cancer for at least 20 years after your treatment. If Pap tests have been discontinued for you, your risk factors (such as having a new sexual partner) need to be  reassessed to determine if you should start having screenings again. Some women have medical problems that increase the chance of getting cervical cancer. In these cases, your health care provider may recommend that you have screening and Pap tests more often.  If you have a family history of uterine cancer or ovarian cancer, talk with your health care provider about genetic screening.  If you have vaginal bleeding after reaching menopause, tell your health care provider.  There are currently no reliable tests available to screen for ovarian cancer.  Lung Cancer Lung cancer screening is recommended for adults 69-62 years old who are at high risk for lung cancer because of a history of smoking. A yearly low-dose CT scan of the lungs is recommended if you:  Currently smoke.  Have a history of at least 30 pack-years of smoking and you currently smoke or have quit within the past 15 years. A pack-year is smoking an average of one pack of cigarettes per day for one year.  Yearly screening should:  Continue until it has been 15 years since you quit.  Stop if you develop a health problem that would prevent you from having lung cancer treatment.  Colorectal Cancer  This type of cancer can be detected and can often be prevented.  Routine colorectal cancer screening usually begins at  age 42 and continues through age 45.  If you have risk factors for colon cancer, your health care provider may recommend that you be screened at an earlier age.  If you have a family history of colorectal cancer, talk with your health care provider about genetic screening.  Your health care provider may also recommend using home test kits to check for hidden blood in your stool.  A small camera at the end of a tube can be used to examine your colon directly (sigmoidoscopy or colonoscopy). This is done to check for the earliest forms of colorectal cancer.  Direct examination of the colon should be repeated every  5-10 years until age 71. However, if early forms of precancerous polyps or small growths are found or if you have a family history or genetic risk for colorectal cancer, you may need to be screened more often.  Skin Cancer  Check your skin from head to toe regularly.  Monitor any moles. Be sure to tell your health care provider: ? About any new moles or changes in moles, especially if there is a change in a mole's shape or color. ? If you have a mole that is larger than the size of a pencil eraser.  If any of your family members has a history of skin cancer, especially at a young age, talk with your health care provider about genetic screening.  Always use sunscreen. Apply sunscreen liberally and repeatedly throughout the day.  Whenever you are outside, protect yourself by wearing long sleeves, pants, a wide-brimmed hat, and sunglasses.  What should I know about osteoporosis? Osteoporosis is a condition in which bone destruction happens more quickly than new bone creation. After menopause, you may be at an increased risk for osteoporosis. To help prevent osteoporosis or the bone fractures that can happen because of osteoporosis, the following is recommended:  If you are 46-71 years old, get at least 1,000 mg of calcium and at least 600 mg of vitamin D per day.  If you are older than age 55 but younger than age 65, get at least 1,200 mg of calcium and at least 600 mg of vitamin D per day.  If you are older than age 54, get at least 1,200 mg of calcium and at least 800 mg of vitamin D per day.  Smoking and excessive alcohol intake increase the risk of osteoporosis. Eat foods that are rich in calcium and vitamin D, and do weight-bearing exercises several times each week as directed by your health care provider. What should I know about how menopause affects my mental health? Depression may occur at any age, but it is more common as you become older. Common symptoms of depression  include:  Low or sad mood.  Changes in sleep patterns.  Changes in appetite or eating patterns.  Feeling an overall lack of motivation or enjoyment of activities that you previously enjoyed.  Frequent crying spells.  Talk with your health care provider if you think that you are experiencing depression. What should I know about immunizations? It is important that you get and maintain your immunizations. These include:  Tetanus, diphtheria, and pertussis (Tdap) booster vaccine.  Influenza every year before the flu season begins.  Pneumonia vaccine.  Shingles vaccine.  Your health care provider may also recommend other immunizations. This information is not intended to replace advice given to you by your health care provider. Make sure you discuss any questions you have with your health care provider. Document Released: 07/03/2005  Document Revised: 11/29/2015 Document Reviewed: 02/12/2015 Elsevier Interactive Patient Education  [employer].

## 2016-12-17 NOTE — Progress Notes (Signed)
Patient: Stacey [name] Female    DOB: 1940-10-08   76 y.o.   MRN: [id number] Visit Date: 12/17/2016  Today's Provider: Mar Daring, PA-C   Chief Complaint  Patient presents with  . Hypertension  . Hyperlipidemia  . Hypothyroidism   Subjective:    HPI  Hypertension, follow-up:  BP Readings from Last 3 Encounters:  12/17/16 (!) 168/72  12/17/16 (!) 172/88  08/24/16 132/80    She was last seen for hypertension 1 years ago.  BP at that visit was 102/67. Management since that visit includes no changes. She reports good compliance with treatment. She is not having side effects.  She is exercising. She is adherent to low salt diet.   Outside blood pressures are checked daily. She is experiencing fatigue.  Patient denies exertional chest pressure/discomfort, lower extremity edema and palpitations.   Cardiovascular risk factors include dyslipidemia.     Weight trend: stable Wt Readings from Last 3 Encounters:  12/17/16 180 lb 6.4 oz (81.8 kg)  01/17/16 174 lb (78.9 kg)  01/06/16 185 lb 9.6 oz (84.2 kg)    Current diet: well balanced   Lipid/Cholesterol, Follow-up:   Last seen for this1 years ago.  Management changes since that visit include no changes. . Last Lipid Panel:    Component Value Date/Time   CHOL 260 (H) 01/07/2016 0702   TRIG 216 (H) 01/07/2016 0702   HDL 48 01/07/2016 0702   CHOLHDL 5.4 (H) 01/07/2016 0702   LDLCALC 169 (H) 01/07/2016 2831    Risk factors for vascular disease include hypertension  She reports good compliance with treatment. She is not having side effects.  Current symptoms include none and have been stable. Weight trend: stable Prior visit with dietician: no Current diet: well balanced Current exercise: walking  Wt Readings from Last 3 Encounters:  12/17/16 180 lb 6.4 oz (81.8 kg)  01/17/16 174 lb (78.9 kg)  01/06/16 185 lb 9.6 oz (84.2 kg)         Allergies  Allergen Reactions  . Cephalexin  Anaphylaxis  . Atorvastatin   . Novocain [Procaine]     Chest pain  . Shellfish Allergy Swelling    angioedema  . Statins Other (See Comments)    Muscle pain  . Tomato     (by testing) - also beans, wheat, peas  . Penicillins Hives and Rash     Current Outpatient Prescriptions:  .  amLODipine (NORVASC) 5 MG tablet, TAKE 1 TABLET (5 MG TOTAL) BY MOUTH DAILY. (Patient not taking: Reported on 12/17/2016), Disp: 90 tablet, Rfl: 3 .  aspirin 81 MG tablet, ASPIRIN, 81MG  (Oral Tablet Delayed Release)  1 Every Day for 0 days  Quantity: 0.00;  Refills: 0   Ordered :17-November-2010  [name] ;  Started 05-Oct-2006 Active Comments: DX: 401.9, Disp: , Rfl:  .  BYSTOLIC 20 MG TABS, TAKE 1 TABLET BY MOUTH EVERY DAY, Disp: 90 tablet, Rfl: 2 .  celecoxib (CELEBREX) 200 MG capsule, Take 1 capsule (200 mg total) by mouth daily. (Patient taking differently: Take 200 mg by mouth 3 (three) times daily. ), Disp: 30 capsule, Rfl: 5 .  Cholecalciferol (VITAMIN D) 2000 units tablet, Take 2,000 Units by mouth daily., Disp: , Rfl:  .  Cyanocobalamin (B-12) 500 MCG TABS, Take by mouth., Disp: , Rfl:  .  diazepam (VALIUM) 10 MG tablet, Take 1 tablet (10 mg total) by mouth as needed., Disp: 30 tablet, Rfl: 5 .  EPINEPHrine (EPIPEN  2-PAK) 0.3 mg/0.3 mL IJ SOAJ injection, INJECT AS DIRECTED FOR SEVERE ALLERGIC REACTION, Disp: 1 Device, Rfl: 1 .  escitalopram (LEXAPRO) 10 MG tablet, Take 1 tablet (10 mg total) by mouth daily., Disp: 90 tablet, Rfl: 3 .  fenofibrate (TRICOR) 145 MG tablet, TAKE 1 TABLET (145 MG TOTAL) BY MOUTH DAILY., Disp: 90 tablet, Rfl: 1 .  fluticasone (FLONASE) 50 MCG/ACT nasal spray, Place 2 sprays into both nostrils daily., Disp: 16 g, Rfl: 11 .  gabapentin (NEURONTIN) 300 MG capsule, Take 1 capsule (300 mg total) by mouth 2 (two) times daily. (Patient taking differently: Take 300 mg by mouth 3 (three) times daily. ), Disp: 180 capsule, Rfl: 3 .  levothyroxine (SYNTHROID, LEVOTHROID) 88 MCG tablet,  Take 1 tablet (88 mcg total) by mouth daily., Disp: 90 tablet, Rfl: 3 .  lisinopril (PRINIVIL,ZESTRIL) 40 MG tablet, TAKE 1 TABLET (40 MG TOTAL) BY MOUTH DAILY., Disp: 90 tablet, Rfl: 3 .  ondansetron (ZOFRAN) 4 MG tablet, Take 1 tablet (4 mg total) by mouth every 8 (eight) hours as needed for nausea or vomiting. (Patient not taking: Reported on 12/17/2016), Disp: 12 tablet, Rfl: 0 .  tacrolimus (PROTOPIC) 0.1 % ointment, TACROLIMUS, 0.1% (External Ointment) - Historical Medication  two times daily (0.1 %) Active, Disp: , Rfl:  .  VENTOLIN HFA 108 (90 Base) MCG/ACT inhaler, INHALE 2 PUFFS BY MOUTH EVERY 4 TO 6 HOURS AS NEEDED FOR SHORTNESS OF BREATH, Disp: 18 g, Rfl: 5 .  zolpidem (AMBIEN) 10 MG tablet, TAKE 1 TABLET BY MOUTH AT BEDTIME AS NEEDED FOR SLEEP, Disp: 30 tablet, Rfl: 5  Review of Systems  Constitutional: Negative.   HENT: Negative.   Eyes: Negative.   Respiratory: Negative.   Cardiovascular: Negative.   Gastrointestinal: Negative.   Endocrine: Negative.   Genitourinary: Negative.   Musculoskeletal: Negative.   Skin: Negative.   Allergic/Immunologic: Negative.   Neurological: Negative.   Hematological: Negative.   Psychiatric/Behavioral: Negative.     Social History  Substance Use Topics  . Smoking status: Former Smoker    Quit date: 05/26/1975  . Smokeless tobacco: Never Used     Comment: quit 45  years ago   . Alcohol use No   Objective:   BP    172/88 (BP Location: Left Arm)     Pulse  64     Temp  98.8 F (37.1 C) (Oral)     Ht  5\' 2"  (1.575 m)     Wt  180 lb 6.4 oz (81.8 kg)      BMI  33.00 kg/m       Physical Exam  Constitutional: She is oriented to person, place, and time. She appears well-developed and well-nourished. No distress.  HENT:  Head: Normocephalic and atraumatic.  Right Ear: Hearing, tympanic membrane, external ear and ear canal normal.  Left Ear: Hearing, tympanic membrane, external ear and ear canal normal.  Nose: Nose  normal.  Mouth/Throat: Uvula is midline, oropharynx is clear and moist and mucous membranes are normal. No oropharyngeal exudate.  Eyes: Pupils are equal, round, and reactive to light. Conjunctivae and EOM are normal. Right eye exhibits no discharge. Left eye exhibits no discharge. No scleral icterus.  Neck: Normal range of motion. Neck supple. No JVD present. Carotid bruit is not present. No tracheal deviation present. No thyromegaly present.  Cardiovascular: Normal rate, regular rhythm, normal heart sounds and intact distal pulses.  Exam reveals no gallop and no friction rub.   No murmur heard. Pulmonary/Chest:  Effort normal and breath sounds normal. No respiratory distress. She has no wheezes. She has no rales. She exhibits no tenderness.  Abdominal: Soft. Bowel sounds are normal. She exhibits no distension and no mass. There is no tenderness. There is no rebound and no guarding.  Musculoskeletal: Normal range of motion. She exhibits no edema or tenderness.  Lymphadenopathy:    She has no cervical adenopathy.  Neurological: She is alert and oriented to person, place, and time.  Skin: Skin is warm and dry. No rash noted. She is not diaphoretic.  Psychiatric: She has a normal mood and affect. Her behavior is normal. Judgment and thought content normal.  Vitals reviewed.      Assessment & Plan:     1. Essential hypertension Uncontrolled. Continue lisinopril 40mg  and bystolic 20mg . Patient has not been taking amlodipine 5mg  so I advised patient to restart this. She is to call if she needs refills of amlodipine. She feels she has a bottle at home. She is to call with BP readings in 4-6 weeks.  - lisinopril (PRINIVIL,ZESTRIL) 40 MG tablet; TAKE 1 TABLET (40 MG TOTAL) BY MOUTH DAILY.  Dispense: 90 tablet; Refill: 3 - CBC w/Diff/Platelet - Comprehensive Metabolic Panel (CMET) - Lipid Profile - HgB A1c  2. Other osteoarthritis involving multiple joints Stable. Diagnosis pulled for medication  refill. Continue current medical treatment plan. - celecoxib (CELEBREX) 200 MG capsule; Take 1 capsule (200 mg total) by mouth 3 (three) times daily.  Dispense: 270 capsule; Refill: 3  3. 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 Diagnosis pulled for medication refill. Continue current medical treatment plan. - EPINEPHrine (EPIPEN 2-PAK) 0.3 mg/0.3 mL IJ SOAJ injection; INJECT AS DIRECTED FOR SEVERE ALLERGIC REACTION  Dispense: 1 Device; Refill: 1  4. Recurrent major depressive disorder, in full remission ([place]) Stable. Diagnosis pulled for medication refill. Continue current medical treatment plan. - escitalopram (LEXAPRO) 10 MG tablet; Take 1 tablet (10 mg total) by mouth daily.  Dispense: 90 tablet; Refill: 3 - diazepam (VALIUM) 10 MG tablet; Take 1 tablet (10 mg total) by mouth as needed.  Dispense: 90 tablet; Refill: 1  5. Hypercholesteremia Stable and tolerating Tricor. Intolerant to statins. Will check labs as below and f/u pending results. - fenofibrate (TRICOR) 145 MG tablet; Take 1 tablet (145 mg total) by mouth daily.  Dispense: 90 tablet; Refill: 3 - Comprehensive Metabolic Panel (CMET) - Lipid Profile - HgB A1c  6. Acute allergic otitis media of right ear, recurrence not specified Stable. Diagnosis pulled for medication refill. Continue current medical treatment plan. - fluticasone (FLONASE) 50 MCG/ACT nasal spray; Place 2 sprays into both nostrils daily.  Dispense: 16 g; Refill: 11  7. Neuropathy Stable. Diagnosis pulled for medication refill. Continue current medical treatment plan. - gabapentin (NEURONTIN) 300 MG capsule; Take 1 capsule (300 mg total) by mouth 3 (three) times daily.  Dispense: 270 capsule; Refill: 3  8. Primary insomnia Only uses prn. Stable. Diagnosis pulled for medication refill. Continue current medical treatment plan. - zolpidem (AMBIEN) 10 MG tablet; Take 1 tablet (10 mg total) by mouth at bedtime as needed. for sleep   Dispense: 90 tablet; Refill: 1  9. Hypothyroidism, unspecified type Stable. Diagnosis pulled for medication refill. Continue current medical treatment plan. Will check labs as below and f/u pending results. - levothyroxine (SYNTHROID, LEVOTHROID) 88 MCG tablet; Take 1 tablet (88 mcg total) by mouth daily.  Dispense: 90 tablet; Refill: 3 - TSH  10. Mild intermittent reactive airway disease without  complication Stable. Diagnosis pulled for medication refill. Continue current medical treatment plan. - albuterol (VENTOLIN HFA) 108 (90 Base) MCG/ACT inhaler; INHALE 2 PUFFS BY MOUTH EVERY 4 TO 6 HOURS AS NEEDED FOR SHORTNESS OF BREATH  Dispense: 18 g; Refill: 5  11. Abnormal blood sugar Will check labs as below and f/u pending results. - HgB A1c       Mar Daring, PA-C  [facility]

## 2016-12-17 NOTE — Patient Instructions (Signed)
Stacey [name] , Thank you for taking time to come for your Medicare Wellness Visit. I appreciate your ongoing commitment to your health goals. Please review the following plan we discussed and let me know if I can assist you in the future.   Screening recommendations/referrals: Colonoscopy: completed 01/17/16 Mammogram: completed 12/17/15, due 11/2016 Bone Density: completed 12/17/15 Recommended yearly ophthalmology/optometry visit for glaucoma screening and checkup Recommended yearly dental visit for hygiene and checkup  Vaccinations: Influenza vaccine: due 01/2017 Pneumococcal vaccine: completed series Tdap vaccine: completed 09/07/15, due 08/2025 Shingles vaccine: completed 11/19/11  Advanced directives: Please bring a copy of your POA (Power of [place]) and/or Living Will to your next appointment.   Conditions/risks identified: Obesity; Recommend eating dinner before 7 o'clock every night.   Next appointment: None, need to schedule 1 year AWV   Preventive Care 22 Years and Older, Female Preventive care refers to lifestyle choices and visits with your health care provider that can promote health and wellness. What does preventive care include?  A yearly physical exam. This is also called an annual well check.  Dental exams once or twice a year.  Routine eye exams. Ask your health care provider how often you should have your eyes checked.  Personal lifestyle choices, including:  Daily care of your teeth and gums.  Regular physical activity.  Eating a healthy diet.  Avoiding tobacco and drug use.  Limiting alcohol use.  Practicing safe sex.  Taking low-dose aspirin every day.  Taking vitamin and mineral supplements as recommended by your health care provider. What happens during an annual well check? The services and screenings done by your health care provider during your annual well check will depend on your age, overall health, lifestyle risk factors, and family  history of disease. Counseling  Your health care provider may ask you questions about your:  Alcohol use.  Tobacco use.  Drug use.  Emotional well-being.  Home and relationship well-being.  Sexual activity.  Eating habits.  History of falls.  Memory and ability to understand (cognition).  Work and work Statistician.  Reproductive health. Screening  You may have the following tests or measurements:  Height, weight, and BMI.  Blood pressure.  Lipid and cholesterol levels. These may be checked every 5 years, or more frequently if you are over 78 years old.  Skin check.  Lung cancer screening. You may have this screening every year starting at age 25 if you have a 30-pack-year history of smoking and currently smoke or have quit within the past 15 years.  Fecal occult blood test (FOBT) of the stool. You may have this test every year starting at age 70.  Flexible sigmoidoscopy or colonoscopy. You may have a sigmoidoscopy every 5 years or a colonoscopy every 10 years starting at age 45.  Hepatitis C blood test.  Hepatitis B blood test.  Sexually transmitted disease (STD) testing.  Diabetes screening. This is done by checking your blood sugar (glucose) after you have not eaten for a while (fasting). You may have this done every 1-3 years.  Bone density scan. This is done to screen for osteoporosis. You may have this done starting at age 48.  Mammogram. This may be done every 1-2 years. Talk to your health care provider about how often you should have regular mammograms. Talk with your health care provider about your test results, treatment options, and if necessary, the need for more tests. Vaccines  Your health care provider may recommend certain vaccines, such as:  Influenza  vaccine. This is recommended every year.  Tetanus, diphtheria, and acellular pertussis (Tdap, Td) vaccine. You may need a Td booster every 10 years.  Zoster vaccine. You may need this after  age 50.  Pneumococcal 13-valent conjugate (PCV13) vaccine. One dose is recommended after age 36.  Pneumococcal polysaccharide (PPSV23) vaccine. One dose is recommended after age 87. Talk to your health care provider about which screenings and vaccines you need and how often you need them. This information is not intended to replace advice given to you by your health care provider. Make sure you discuss any questions you have with your health care provider. Document Released: 06/07/2015 Document Revised: 01/29/2016 Document Reviewed: 03/12/2015 Elsevier Interactive Patient Education  2017 [place] Prevention in the Home Falls can cause injuries. They can happen to people of all ages. There are many things you can do to make your home safe and to help prevent falls. What can I do on the outside of my home?  Regularly fix the edges of walkways and driveways and fix any cracks.  Remove anything that might make you trip as you walk through a door, such as a raised step or threshold.  Trim any bushes or trees on the path to your home.  Use bright outdoor lighting.  Clear any walking paths of anything that might make someone trip, such as rocks or tools.  Regularly check to see if handrails are loose or broken. Make sure that both sides of any steps have handrails.  Any raised decks and porches should have guardrails on the edges.  Have any leaves, snow, or ice cleared regularly.  Use sand or salt on walking paths during winter.  Clean up any spills in your garage right away. This includes oil or grease spills. What can I do in the bathroom?  Use night lights.  Install grab bars by the toilet and in the tub and shower. Do not use towel bars as grab bars.  Use non-skid mats or decals in the tub or shower.  If you need to sit down in the shower, use a plastic, non-slip stool.  Keep the floor dry. Clean up any water that spills on the floor as soon as it  happens.  Remove soap buildup in the tub or shower regularly.  Attach bath mats securely with double-sided non-slip rug tape.  Do not have throw rugs and other things on the floor that can make you trip. What can I do in the bedroom?  Use night lights.  Make sure that you have a light by your bed that is easy to reach.  Do not use any sheets or blankets that are too big for your bed. They should not hang down onto the floor.  Have a firm chair that has side arms. You can use this for support while you get dressed.  Do not have throw rugs and other things on the floor that can make you trip. What can I do in the kitchen?  Clean up any spills right away.  Avoid walking on wet floors.  Keep items that you use a lot in easy-to-reach places.  If you need to reach something above you, use a strong step stool that has a grab bar.  Keep electrical cords out of the way.  Do not use floor polish or wax that makes floors slippery. If you must use wax, use non-skid floor wax.  Do not have throw rugs and other things on the floor that can  make you trip. What can I do with my stairs?  Do not leave any items on the stairs.  Make sure that there are handrails on both sides of the stairs and use them. Fix handrails that are broken or loose. Make sure that handrails are as long as the stairways.  Check any carpeting to make sure that it is firmly attached to the stairs. Fix any carpet that is loose or worn.  Avoid having throw rugs at the top or bottom of the stairs. If you do have throw rugs, attach them to the floor with carpet tape.  Make sure that you have a light switch at the top of the stairs and the bottom of the stairs. If you do not have them, ask someone to add them for you. What else can I do to help prevent falls?  Wear shoes that:  Do not have high heels.  Have rubber bottoms.  Are comfortable and fit you well.  Are closed at the toe. Do not wear sandals.  If you  use a stepladder:  Make sure that it is fully opened. Do not climb a closed stepladder.  Make sure that both sides of the stepladder are locked into place.  Ask someone to hold it for you, if possible.  Clearly mark and make sure that you can see:  Any grab bars or handrails.  First and last steps.  Where the edge of each step is.  Use tools that help you move around (mobility aids) if they are needed. These include:  Canes.  Walkers.  Scooters.  Crutches.  Turn on the lights when you go into a dark area. Replace any light bulbs as soon as they burn out.  Set up your furniture so you have a clear path. Avoid moving your furniture around.  If any of your floors are uneven, fix them.  If there are any pets around you, be aware of where they are.  Review your medicines with your doctor. Some medicines can make you feel dizzy. This can increase your chance of falling. Ask your doctor what other things that you can do to help prevent falls. This information is not intended to replace advice given to you by your health care provider. Make sure you discuss any questions you have with your health care provider. Document Released: 03/07/2009 Document Revised: 10/17/2015 Document Reviewed: 06/15/2014 Elsevier Interactive Patient Education  2017 [employer].

## 2016-12-17 NOTE — Progress Notes (Signed)
Subjective:   Stacey [name] is a 76 y.o. female who presents for Medicare Annual (Subsequent) preventive examination.  Review of Systems:  N/A  Cardiac Risk Factors include: advanced age (>23men, >4 women);hypertension;obesity (BMI >30kg/m2)     Objective:     Vitals: BP (!) 172/88 (BP Location: Left Arm)   Pulse 64   Temp 98.8 F (37.1 C) (Oral)   Ht 5\' 2"  (1.575 m)   Wt 180 lb 6.4 oz (81.8 kg)   BMI 33.00 kg/m   Body mass index is 33 kg/m.   Tobacco History  Smoking Status  . Former Smoker  . Quit date: 05/26/1975  Smokeless Tobacco  . Never Used    Comment: quit 45  years ago      Counseling given: Not Answered   Past Medical History:  Diagnosis Date  . Arthritis   . Asthma   . Cancer ([place])   . Complication of anesthesia   . Depression   . Family history of adverse reaction to anesthesia    most of family - PONV  . Fibromyalgia   . Glaucoma   . Headache    migraines - 1-2x/mo  . Hypertension   . Motion sickness    all moving vehicles  . PONV (postoperative nausea and vomiting)   . Thyroid disease    Past Surgical History:  Procedure Laterality Date  . ABDOMINAL HYSTERECTOMY    . APPENDECTOMY    . BLADDER SURGERY    . CATARACT EXTRACTION W/ INTRAOCULAR LENS  IMPLANT, BILATERAL    . COLONOSCOPY WITH PROPOFOL N/A 01/17/2016   Procedure: COLONOSCOPY WITH PROPOFOL;  Surgeon: [name], MD;  Location: [facility];  Service: Endoscopy;  Laterality: N/A;  . NASAL SINUS SURGERY    . POLYPECTOMY  01/17/2016   Procedure: POLYPECTOMY;  Surgeon: [name], MD;  Location: [facility];  Service: Endoscopy;;  . RIGHT OOPHORECTOMY     Family History  Problem Relation Age of Onset  . Alzheimer's disease Mother   . Kidney cancer Mother   . Heart attack Father   . Pancreatic cancer Sister   . Heart attack Brother 59  . Ovarian cancer Sister   . Kidney disease Sister        dialysis  . Cancer Sister        uterin  . Diabetes  Sister   . Heart attack Sister   . Heart attack Brother 38   History  Sexual Activity  . Sexual activity: Not on file    Outpatient Encounter Prescriptions as of 12/17/2016  Medication Sig  . aspirin 81 MG tablet ASPIRIN, 81MG  (Oral Tablet Delayed Release)  1 Every Day for 0 days  Quantity: 0.00;  Refills: 0   Ordered :17-November-2010  [name] ;  Started 05-Oct-2006 Active Comments: DX: 401.9  . BYSTOLIC 20 MG TABS TAKE 1 TABLET BY MOUTH EVERY DAY  . celecoxib (CELEBREX) 200 MG capsule Take 1 capsule (200 mg total) by mouth daily. (Patient taking differently: Take 200 mg by mouth 3 (three) times daily. )  . Cholecalciferol (VITAMIN D) 2000 units tablet Take 2,000 Units by mouth daily.  . Cyanocobalamin (B-12) 500 MCG TABS Take by mouth.  . diazepam (VALIUM) 10 MG tablet Take 1 tablet (10 mg total) by mouth as needed.  Marland Kitchen EPINEPHrine (EPIPEN 2-PAK) 0.3 mg/0.3 mL IJ SOAJ injection INJECT AS DIRECTED FOR SEVERE ALLERGIC REACTION  . escitalopram (LEXAPRO) 10 MG tablet Take 1 tablet (10 mg total) by  mouth daily.  . fenofibrate (TRICOR) 145 MG tablet TAKE 1 TABLET (145 MG TOTAL) BY MOUTH DAILY.  . fluticasone (FLONASE) 50 MCG/ACT nasal spray Place 2 sprays into both nostrils daily.  Marland Kitchen gabapentin (NEURONTIN) 300 MG capsule Take 1 capsule (300 mg total) by mouth 2 (two) times daily. (Patient taking differently: Take 300 mg by mouth 3 (three) times daily. )  . levothyroxine (SYNTHROID, LEVOTHROID) 88 MCG tablet Take 1 tablet (88 mcg total) by mouth daily.  Marland Kitchen lisinopril (PRINIVIL,ZESTRIL) 40 MG tablet TAKE 1 TABLET (40 MG TOTAL) BY MOUTH DAILY.  Marland Kitchen tacrolimus (PROTOPIC) 0.1 % ointment TACROLIMUS, 0.1% (External Ointment) - Historical Medication  two times daily (0.1 %) Active  . VENTOLIN HFA 108 (90 Base) MCG/ACT inhaler INHALE 2 PUFFS BY MOUTH EVERY 4 TO 6 HOURS AS NEEDED FOR SHORTNESS OF BREATH  . zolpidem (AMBIEN) 10 MG tablet TAKE 1 TABLET BY MOUTH AT BEDTIME AS NEEDED FOR SLEEP  . amLODipine  (NORVASC) 5 MG tablet TAKE 1 TABLET (5 MG TOTAL) BY MOUTH DAILY. (Patient not taking: Reported on 12/17/2016)  . ondansetron (ZOFRAN) 4 MG tablet Take 1 tablet (4 mg total) by mouth every 8 (eight) hours as needed for nausea or vomiting. (Patient not taking: Reported on 12/17/2016)   No facility-administered encounter medications on file as of 12/17/2016.     Activities of Daily Living In your present state of health, do you have any difficulty performing the following activities: 12/17/2016 01/17/2016  Hearing? N N  Vision? N N  Difficulty concentrating or making decisions? N N  Walking or climbing stairs? N Y  Dressing or bathing? N N  Doing errands, shopping? N -  Preparing Food and eating ? N -  Using the Toilet? N -  In the past six months, have you accidently leaked urine? Y -  Do you have problems with loss of bowel control? N -  Managing your Medications? N -  Managing your Finances? N -  Housekeeping or managing your Housekeeping? 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 Cut out extra servings          Recommend eating dinner before 7 o'clock every night.     . Exercise 150 minutes per week (moderate activity)      Fall Risk Fall Risk  12/17/2016 12/04/2015 11/30/2014  Falls in the past year? No No Yes  Number falls in past yr: - - 2 or more  Injury with Fall? - - Yes    Depression Screen PHQ 2/9 Scores 12/17/2016 12/17/2016 12/04/2015 11/30/2014  PHQ - 2 Score 0 0 0 2  PHQ- 9 Score 6 - - 12     Cognitive Function     6CIT Screen 12/17/2016  What Year? 0 points  What month? 0 points  What time? 0 points  Count back from 20 0 points  Months in reverse 0 points  Repeat phrase 2 points  Total Score 2  Immunization History  Administered Date(s) Administered  . Influenza,inj,Quad PF,36+ Mos 01/24/2015  . Pneumococcal Conjugate-13 11/22/2013  . Pneumococcal Polysaccharide-23 03/30/2005, 11/19/2011  . Tdap 10/01/2005  . Zoster 11/19/2011   Screening Tests Health Maintenance  Topic Date Due  . MAMMOGRAM  11/22/2017 (Originally 12/16/2016)  . INFLUENZA VACCINE  12/23/2016  . TETANUS/TDAP  09/06/2025  . COLONOSCOPY  01/16/2026  . DEXA SCAN  Completed  . PNA vac Low Risk Adult  Completed      Plan:  I have personally reviewed and addressed the Medicare Annual Wellness questionnaire and have noted the following in the patient's chart:  A. Medical and social history B. Use of alcohol, tobacco or illicit drugs  C. Current medications and supplements D. Functional ability and status E.  Nutritional status F.  Physical activity G. Advance directives H. List of other physicians I.  Hospitalizations, surgeries, and ER visits in previous 12 months J.  [place] such as hearing and vision if needed, cognitive and depression L. Referrals and appointments - none  In addition, I have reviewed and discussed with patient certain preventive protocols, quality metrics, and best practice recommendations. A written personalized care plan for preventive services as well as general preventive health recommendations were provided to patient.  See attached scanned questionnaire for additional information.   Signed,  Fabio Neighbors, LPN Nurse Health Advisor   MD Recommendations: None. Pt to set up mammogram screening within the next year.

## 2016-12-18 DIAGNOSIS — R7309 Other abnormal glucose: Secondary | ICD-10-CM | POA: Diagnosis not present

## 2016-12-18 DIAGNOSIS — I10 Essential (primary) hypertension: Secondary | ICD-10-CM | POA: Diagnosis not present

## 2016-12-18 DIAGNOSIS — E039 Hypothyroidism, unspecified: Secondary | ICD-10-CM | POA: Diagnosis not present

## 2016-12-18 DIAGNOSIS — E7800 Pure hypercholesterolemia, unspecified: Secondary | ICD-10-CM | POA: Diagnosis not present

## 2016-12-19 LAB — CBC WITH DIFFERENTIAL/PLATELET
BASOS: 1 %
Basophils Absolute: 0 10*3/uL (ref 0.0–0.2)
EOS (ABSOLUTE): 0.3 10*3/uL (ref 0.0–0.4)
EOS: 7 %
HEMATOCRIT: 38.3 % (ref 34.0–46.6)
HEMOGLOBIN: 12.4 g/dL (ref 11.1–15.9)
Immature Grans (Abs): 0 10*3/uL (ref 0.0–0.1)
Immature Granulocytes: 0 %
Lymphocytes Absolute: 1.2 10*3/uL (ref 0.7–3.1)
Lymphs: 31 %
MCH: 28.7 pg (ref 26.6–33.0)
MCHC: 32.4 g/dL (ref 31.5–35.7)
MCV: 89 fL (ref 79–97)
MONOCYTES: 9 %
Monocytes Absolute: 0.3 10*3/uL (ref 0.1–0.9)
NEUTROS ABS: 2.1 10*3/uL (ref 1.4–7.0)
NEUTROS PCT: 52 %
Platelets: 189 10*3/uL (ref 150–379)
RBC: 4.32 x10E6/uL (ref 3.77–5.28)
RDW: 12.6 % (ref 12.3–15.4)
WBC: 3.9 10*3/uL (ref 3.4–10.8)

## 2016-12-19 LAB — COMPREHENSIVE METABOLIC PANEL
ALBUMIN: 4 g/dL (ref 3.5–4.8)
ALK PHOS: 39 IU/L (ref 39–117)
ALT: 12 IU/L (ref 0–32)
AST: 16 IU/L (ref 0–40)
Albumin/Globulin Ratio: 1.7 (ref 1.2–2.2)
BILIRUBIN TOTAL: 0.3 mg/dL (ref 0.0–1.2)
BUN / CREAT RATIO: 24 (ref 12–28)
BUN: 21 mg/dL (ref 8–27)
CHLORIDE: 97 mmol/L (ref 96–106)
CO2: 29 mmol/L (ref 20–29)
Calcium: 9.2 mg/dL (ref 8.7–10.3)
Creatinine, Ser: 0.88 mg/dL (ref 0.57–1.00)
GFR calc non Af Amer: 65 mL/min/{1.73_m2} (ref >59)
GFR, EST AFRICAN AMERICAN: 75 mL/min/{1.73_m2} (ref >59)
GLOBULIN, TOTAL: 2.3 g/dL (ref 1.5–4.5)
Glucose: 97 mg/dL (ref 65–99)
Potassium: 4.7 mmol/L (ref 3.5–5.2)
SODIUM: 138 mmol/L (ref 134–144)
Total Protein: 6.3 g/dL (ref 6.0–8.5)

## 2016-12-19 LAB — LIPID PANEL
CHOL/HDL RATIO: 3.2 ratio (ref 0.0–4.4)
Cholesterol, Total: 174 mg/dL (ref 100–199)
HDL: 54 mg/dL (ref >39)
LDL Calculated: 104 mg/dL — ABNORMAL HIGH (ref 0–99)
Triglycerides: 79 mg/dL (ref 0–149)
VLDL CHOLESTEROL CAL: 16 mg/dL (ref 5–40)

## 2016-12-19 LAB — HEMOGLOBIN A1C
ESTIMATED AVERAGE GLUCOSE: 108 mg/dL
HEMOGLOBIN A1C: 5.4 % (ref 4.8–5.6)

## 2016-12-19 LAB — TSH: TSH: 1.27 u[IU]/mL (ref 0.450–4.500)

## 2016-12-21 ENCOUNTER — Telehealth: Payer: Self-pay

## 2016-12-21 DIAGNOSIS — I10 Essential (primary) hypertension: Secondary | ICD-10-CM

## 2016-12-21 MED ORDER — AMLODIPINE BESYLATE 10 MG PO TABS: 10.0000 mg | ORAL_TABLET | Freq: Every day | ORAL | 0 refills | Status: DC

## 2016-12-21 NOTE — Telephone Encounter (Signed)
Pt advised-aa 

## 2016-12-21 NOTE — Telephone Encounter (Signed)
Patient wanted to let you know that she has been taking Amlodipine with the rest of her medications. She wanted to know what should she do for her b/p at this time since it was elevated? -aa

## 2016-12-21 NOTE — Telephone Encounter (Signed)
She can increase to two pills daily.  I will send in refill for 10mg  instead of the 5mg  so then she would only need the one pill.

## 2016-12-26 ENCOUNTER — Other Ambulatory Visit: Payer: Self-pay | Admitting: Physician Assistant

## 2016-12-26 DIAGNOSIS — E7800 Pure hypercholesterolemia, unspecified: Secondary | ICD-10-CM

## 2016-12-28 DIAGNOSIS — I10 Essential (primary) hypertension: Secondary | ICD-10-CM | POA: Diagnosis not present

## 2016-12-28 DIAGNOSIS — E782 Mixed hyperlipidemia: Secondary | ICD-10-CM | POA: Diagnosis not present

## 2016-12-28 DIAGNOSIS — R0602 Shortness of breath: Secondary | ICD-10-CM | POA: Diagnosis not present

## 2017-01-06 DIAGNOSIS — M1712 Unilateral primary osteoarthritis, left knee: Secondary | ICD-10-CM | POA: Diagnosis not present

## 2017-01-11 DIAGNOSIS — I340 Nonrheumatic mitral (valve) insufficiency: Secondary | ICD-10-CM | POA: Diagnosis not present

## 2017-01-11 DIAGNOSIS — I10 Essential (primary) hypertension: Secondary | ICD-10-CM | POA: Diagnosis not present

## 2017-01-11 DIAGNOSIS — E782 Mixed hyperlipidemia: Secondary | ICD-10-CM | POA: Diagnosis not present

## 2017-01-11 DIAGNOSIS — R0602 Shortness of breath: Secondary | ICD-10-CM | POA: Diagnosis not present

## 2017-01-21 ENCOUNTER — Other Ambulatory Visit: Payer: Self-pay | Admitting: Physician Assistant

## 2017-01-21 DIAGNOSIS — I10 Essential (primary) hypertension: Secondary | ICD-10-CM

## 2017-02-17 DIAGNOSIS — Z23 Encounter for immunization: Secondary | ICD-10-CM | POA: Diagnosis not present

## 2017-02-18 ENCOUNTER — Ambulatory Visit (INDEPENDENT_AMBULATORY_CARE_PROVIDER_SITE_OTHER): Payer: Medicare Other | Admitting: Family Medicine

## 2017-02-18 ENCOUNTER — Encounter: Payer: Self-pay | Admitting: Family Medicine

## 2017-02-18 VITALS — BP 128/70 | HR 63 | Temp 98.4°F | Resp 16

## 2017-02-18 DIAGNOSIS — R079 Chest pain, unspecified: Secondary | ICD-10-CM | POA: Diagnosis not present

## 2017-02-18 DIAGNOSIS — M546 Pain in thoracic spine: Secondary | ICD-10-CM

## 2017-02-18 DIAGNOSIS — M542 Cervicalgia: Secondary | ICD-10-CM

## 2017-02-18 DIAGNOSIS — F3342 Major depressive disorder, recurrent, in full remission: Secondary | ICD-10-CM

## 2017-02-18 MED ORDER — BUPROPION HCL ER (XL) 150 MG PO TB24: 150.0000 mg | ORAL_TABLET | Freq: Every day | ORAL | 12 refills | Status: DC

## 2017-02-18 MED ORDER — PREDNISONE 10 MG PO TABS: 10.0000 mg | ORAL_TABLET | Freq: Every day | ORAL | 0 refills | Status: DC

## 2017-02-18 NOTE — Progress Notes (Signed)
Patient: Stacey [name] Female    DOB: Jan 30, 1941   76 y.o.   MRN: [id number] Visit Date: 02/18/2017  Today's Provider: Wilhemena Durie, MD   Chief Complaint  Patient presents with  . Hypertension   Subjective:    HPI  Hypertension, follow-up:  BP Readings from Last 3 Encounters:  12/17/16 (!) 168/72  12/17/16 (!) 172/88  08/24/16 132/80    She was last seen for hypertension 2 months ago (seen by Fenton Malling, PA-C).  BP at that visit was 168/72. Management since that visit includes increasing Amlodipine from 5mg  to 10mg  daily. She reports good compliance with treatment. She is not having side effects.  She is not exercising. She is adherent to low salt diet.   Outside blood pressures are 130-169/ 70-80. She is experiencing chest pain and lower extremity edema.  Patient denies dyspnea, near-syncope, palpitations, syncope and tachypnea.   Cardiovascular risk factors include advanced age (older than 56 for men, 71 for women) and hypertension.  Use of agents associated with hypertension: NSAIDS.     Weight trend: stable Wt Readings from Last 3 Encounters:  12/17/16 180 lb 6.4 oz (81.8 kg)  01/17/16 174 lb (78.9 kg)  01/06/16 185 lb 9.6 oz (84.2 kg)    Current diet: in general, an "unhealthy" diet  ------------------------------------------------------------------------     Allergies  Allergen Reactions  . Cephalexin Anaphylaxis  . Atorvastatin   . Novocain [Procaine]     Chest pain  . Shellfish Allergy Swelling    angioedema  . Statins Other (See Comments)    Muscle pain  . Tomato     (by testing) - also beans, wheat, peas  . Penicillins Hives and Rash     Current Outpatient Prescriptions:  .  albuterol (VENTOLIN HFA) 108 (90 Base) MCG/ACT inhaler, INHALE 2 PUFFS BY MOUTH EVERY 4 TO 6 HOURS AS NEEDED FOR SHORTNESS OF BREATH, Disp: 18 g, Rfl: 5 .  amLODipine (NORVASC) 10 MG tablet, Take 1 tablet (10 mg total) by mouth daily., Disp: 90  tablet, Rfl: 0 .  aspirin 81 MG tablet, ASPIRIN, 81MG  (Oral Tablet Delayed Release)  1 Every Day for 0 days  Quantity: 0.00;  Refills: 0   Ordered :17-November-2010  [name] ;  Started 05-Oct-2006 Active Comments: DX: 401.9, Disp: , Rfl:  .  BYSTOLIC 20 MG TABS, TAKE 1 TABLET BY MOUTH EVERY DAY, Disp: 90 tablet, Rfl: 2 .  celecoxib (CELEBREX) 200 MG capsule, Take 1 capsule (200 mg total) by mouth 3 (three) times daily., Disp: 270 capsule, Rfl: 3 .  Cholecalciferol (VITAMIN D) 2000 units tablet, Take 2,000 Units by mouth daily., Disp: , Rfl:  .  Cyanocobalamin (B-12) 500 MCG TABS, Take by mouth., Disp: , Rfl:  .  diazepam (VALIUM) 10 MG tablet, Take 1 tablet (10 mg total) by mouth as needed., Disp: 90 tablet, Rfl: 1 .  EPINEPHrine (EPIPEN 2-PAK) 0.3 mg/0.3 mL IJ SOAJ injection, INJECT AS DIRECTED FOR SEVERE ALLERGIC REACTION, Disp: 1 Device, Rfl: 1 .  escitalopram (LEXAPRO) 10 MG tablet, Take 1 tablet (10 mg total) by mouth daily., Disp: 90 tablet, Rfl: 3 .  fenofibrate (TRICOR) 145 MG tablet, TAKE 1 TABLET (145 MG TOTAL) BY MOUTH DAILY., Disp: 90 tablet, Rfl: 1 .  fluticasone (FLONASE) 50 MCG/ACT nasal spray, Place 2 sprays into both nostrils daily., Disp: 16 g, Rfl: 11 .  gabapentin (NEURONTIN) 300 MG capsule, Take 1 capsule (300 mg total) by mouth 3 (three)  times daily., Disp: 270 capsule, Rfl: 3 .  levothyroxine (SYNTHROID, LEVOTHROID) 88 MCG tablet, Take 1 tablet (88 mcg total) by mouth daily., Disp: 90 tablet, Rfl: 3 .  lisinopril (PRINIVIL,ZESTRIL) 40 MG tablet, TAKE 1 TABLET (40 MG TOTAL) BY MOUTH DAILY., Disp: 90 tablet, Rfl: 3 .  ondansetron (ZOFRAN) 4 MG tablet, Take 1 tablet (4 mg total) by mouth every 8 (eight) hours as needed for nausea or vomiting. (Patient not taking: Reported on 12/17/2016), Disp: 12 tablet, Rfl: 0 .  tacrolimus (PROTOPIC) 0.1 % ointment, TACROLIMUS, 0.1% (External Ointment) - Historical Medication  two times daily (0.1 %) Active, Disp: , Rfl:  .  zolpidem (AMBIEN) 10  MG tablet, Take 1 tablet (10 mg total) by mouth at bedtime as needed. for sleep, Disp: 90 tablet, Rfl: 1  Review of Systems  Constitutional: Negative for appetite change, chills, fatigue and fever.  HENT: Negative.   Eyes: Negative.   Respiratory: Negative for chest tightness and shortness of breath.   Cardiovascular: Positive for chest pain. Negative for palpitations.       Patient has no chest pain but over  the past few days has developed neck pain and thoracic back pain radiating to the chest when she exerts herself.. Again no rest pain. She does have some dyspnea when she exerts herself and is worsened also.  Gastrointestinal: Negative for abdominal pain, nausea and vomiting.  Endocrine: Negative.   Musculoskeletal: Positive for arthralgias (shoulder pain), myalgias (pain in both arms) and neck pain.       Patient thinks her neck and back pain are from her spine.  Allergic/Immunologic: Negative.   Neurological: Positive for headaches. Negative for dizziness and weakness.  Hematological: Negative.   Psychiatric/Behavioral: Negative.        She admits to being depressed. She is slightly tearful during exam today.    Social History  Substance Use Topics  . Smoking status: Former Smoker    Quit date: 05/26/1975  . Smokeless tobacco: Never Used     Comment: quit 45  years ago   . Alcohol use No   Objective:   BP 128/70 (BP Location: Left Arm, Patient Position: Sitting, Cuff Size: Normal)   Pulse 63   Temp 98.4 F (36.9 C) (Oral)   Resp 16   SpO2 96% Comment: room air There were no vitals filed for this visit.   Physical Exam  Constitutional: She is oriented to person, place, and time. She appears well-developed and well-nourished.  HENT:  Head: Normocephalic and atraumatic.  Right Ear: External ear normal.  Left Ear: External ear normal.  Eyes: Conjunctivae are normal.  Neck: Neck supple.  Cardiovascular: Normal rate, regular rhythm and normal heart sounds.     Pulmonary/Chest: Effort normal and breath sounds normal.  Abdominal: Soft.  Musculoskeletal: She exhibits no edema.  Minimal tenderness over cervical spine and thoracic spine.  Neurological: She is alert and oriented to person, place, and time.  Skin: Skin is warm and dry.  Psychiatric: She has a normal mood and affect. Her behavior is normal. Judgment and thought content normal.        Assessment & Plan:     1. Chest pain, unspecified type  Pain referred from neck and back. I am concerned this is developing angina. She is taking We'll plan on referring to cardiology. - EKG 12-Lead--unremarkable. - DG Thoracic Spine W/Swimmers; Future - DG Cervical Spine 2 or 3 views; Future  2. Recurrent major depressive disorder,  ([place])  -  buPROPion (WELLBUTRIN XL) 150 MG 24 hr tablet; Take 1 tablet (150 mg total) by mouth daily.  Dispense: 30 tablet; Refill: 12  3. Thoracic back pain, unspecified back pain laterality, unspecified chronicity  - DG Thoracic Spine W/Swimmers; Future - DG Cervical Spine 2 or 3 views; Future  4. Neck pain  - DG Thoracic Spine W/Swimmers; Future - DG Cervical Spine 2 or 3 views; Future      I have done the exam and reviewed the above chart and it is accurate to the best of my knowledge. Development worker, community has been used in this note in any air is in the dictation or transcription are unintentional.  Wilhemena Durie, MD  [place]

## 2017-02-19 ENCOUNTER — Emergency Department: Payer: Medicare Other

## 2017-02-19 ENCOUNTER — Encounter: Payer: Self-pay | Admitting: Emergency Medicine

## 2017-02-19 ENCOUNTER — Ambulatory Visit
Admission: RE | Admit: 2017-02-19 | Discharge: 2017-02-19 | Disposition: A | Discharge status: Home / Self Care | Payer: Medicare Other | Source: Ambulatory Visit | Attending: Family Medicine | Admitting: Family Medicine

## 2017-02-19 ENCOUNTER — Other Ambulatory Visit: Payer: Self-pay

## 2017-02-19 ENCOUNTER — Inpatient Hospital Stay
Admission: EM | Admit: 2017-02-19 | Discharge: 2017-02-20 | DRG: 281 | Disposition: A | Discharge status: Other Acute Inpatient Hospital | Payer: Medicare Other | Attending: Internal Medicine | Admitting: Internal Medicine

## 2017-02-19 DIAGNOSIS — M542 Cervicalgia: Secondary | ICD-10-CM

## 2017-02-19 DIAGNOSIS — H409 Unspecified glaucoma: Secondary | ICD-10-CM | POA: Diagnosis present

## 2017-02-19 DIAGNOSIS — Z9842 Cataract extraction status, left eye: Secondary | ICD-10-CM

## 2017-02-19 DIAGNOSIS — Z8041 Family history of malignant neoplasm of ovary: Secondary | ICD-10-CM

## 2017-02-19 DIAGNOSIS — I10 Essential (primary) hypertension: Secondary | ICD-10-CM | POA: Diagnosis present

## 2017-02-19 DIAGNOSIS — E7800 Pure hypercholesterolemia, unspecified: Secondary | ICD-10-CM | POA: Diagnosis present

## 2017-02-19 DIAGNOSIS — R079 Chest pain, unspecified: Secondary | ICD-10-CM | POA: Diagnosis not present

## 2017-02-19 DIAGNOSIS — M546 Pain in thoracic spine: Secondary | ICD-10-CM | POA: Insufficient documentation

## 2017-02-19 DIAGNOSIS — Z91018 Allergy to other foods: Secondary | ICD-10-CM

## 2017-02-19 DIAGNOSIS — M797 Fibromyalgia: Secondary | ICD-10-CM | POA: Diagnosis present

## 2017-02-19 DIAGNOSIS — Z820 Family history of epilepsy and other diseases of the nervous system: Secondary | ICD-10-CM

## 2017-02-19 DIAGNOSIS — I25119 Atherosclerotic heart disease of native coronary artery with unspecified angina pectoris: Secondary | ICD-10-CM | POA: Diagnosis present

## 2017-02-19 DIAGNOSIS — S299XXA Unspecified injury of thorax, initial encounter: Secondary | ICD-10-CM | POA: Diagnosis not present

## 2017-02-19 DIAGNOSIS — S199XXA Unspecified injury of neck, initial encounter: Secondary | ICD-10-CM | POA: Diagnosis not present

## 2017-02-19 DIAGNOSIS — Z800 Family history of malignant neoplasm of digestive organs: Secondary | ICD-10-CM

## 2017-02-19 DIAGNOSIS — E871 Hypo-osmolality and hyponatremia: Secondary | ICD-10-CM

## 2017-02-19 DIAGNOSIS — Z8249 Family history of ischemic heart disease and other diseases of the circulatory system: Secondary | ICD-10-CM

## 2017-02-19 DIAGNOSIS — M5030 Other cervical disc degeneration, unspecified cervical region: Secondary | ICD-10-CM | POA: Insufficient documentation

## 2017-02-19 DIAGNOSIS — R739 Hyperglycemia, unspecified: Secondary | ICD-10-CM

## 2017-02-19 DIAGNOSIS — Z90710 Acquired absence of both cervix and uterus: Secondary | ICD-10-CM

## 2017-02-19 DIAGNOSIS — Z884 Allergy status to anesthetic agent status: Secondary | ICD-10-CM

## 2017-02-19 DIAGNOSIS — M549 Dorsalgia, unspecified: Secondary | ICD-10-CM | POA: Diagnosis not present

## 2017-02-19 DIAGNOSIS — Z91013 Allergy to seafood: Secondary | ICD-10-CM

## 2017-02-19 DIAGNOSIS — J45909 Unspecified asthma, uncomplicated: Secondary | ICD-10-CM | POA: Diagnosis present

## 2017-02-19 DIAGNOSIS — M1990 Unspecified osteoarthritis, unspecified site: Secondary | ICD-10-CM | POA: Diagnosis present

## 2017-02-19 DIAGNOSIS — Z841 Family history of disorders of kidney and ureter: Secondary | ICD-10-CM

## 2017-02-19 DIAGNOSIS — Z9841 Cataract extraction status, right eye: Secondary | ICD-10-CM

## 2017-02-19 DIAGNOSIS — Z87891 Personal history of nicotine dependence: Secondary | ICD-10-CM | POA: Diagnosis not present

## 2017-02-19 DIAGNOSIS — I214 Non-ST elevation (NSTEMI) myocardial infarction: Secondary | ICD-10-CM | POA: Diagnosis not present

## 2017-02-19 DIAGNOSIS — Z888 Allergy status to other drugs, medicaments and biological substances status: Secondary | ICD-10-CM

## 2017-02-19 DIAGNOSIS — Z8051 Family history of malignant neoplasm of kidney: Secondary | ICD-10-CM

## 2017-02-19 DIAGNOSIS — Z885 Allergy status to narcotic agent status: Secondary | ICD-10-CM

## 2017-02-19 DIAGNOSIS — E039 Hypothyroidism, unspecified: Secondary | ICD-10-CM | POA: Diagnosis present

## 2017-02-19 DIAGNOSIS — G43909 Migraine, unspecified, not intractable, without status migrainosus: Secondary | ICD-10-CM | POA: Diagnosis present

## 2017-02-19 DIAGNOSIS — Z880 Allergy status to penicillin: Secondary | ICD-10-CM

## 2017-02-19 DIAGNOSIS — Z7982 Long term (current) use of aspirin: Secondary | ICD-10-CM

## 2017-02-19 DIAGNOSIS — Z961 Presence of intraocular lens: Secondary | ICD-10-CM | POA: Diagnosis present

## 2017-02-19 DIAGNOSIS — Z833 Family history of diabetes mellitus: Secondary | ICD-10-CM

## 2017-02-19 LAB — BRAIN NATRIURETIC PEPTIDE: B Natriuretic Peptide: 479 pg/mL — ABNORMAL HIGH (ref 0.0–100.0)

## 2017-02-19 LAB — TROPONIN I: Troponin I: 0.07 ng/mL (ref <0.03)

## 2017-02-19 LAB — CBC
HEMATOCRIT: 41.4 % (ref 35.0–47.0)
Hemoglobin: 14.1 g/dL (ref 12.0–16.0)
MCH: 29.8 pg (ref 26.0–34.0)
MCHC: 34.1 g/dL (ref 32.0–36.0)
MCV: 87.3 fL (ref 80.0–100.0)
Platelets: 212 10*3/uL (ref 150–440)
RBC: 4.74 MIL/uL (ref 3.80–5.20)
RDW: 13.4 % (ref 11.5–14.5)
WBC: 5.8 10*3/uL (ref 3.6–11.0)

## 2017-02-19 LAB — BASIC METABOLIC PANEL
Anion gap: 12 (ref 5–15)
BUN: 25 mg/dL — ABNORMAL HIGH (ref 6–20)
CHLORIDE: 94 mmol/L — AB (ref 101–111)
CO2: 26 mmol/L (ref 22–32)
Calcium: 9.8 mg/dL (ref 8.9–10.3)
Creatinine, Ser: 0.98 mg/dL (ref 0.44–1.00)
GFR calc non Af Amer: 55 mL/min — ABNORMAL LOW (ref >60)
Glucose, Bld: 268 mg/dL — ABNORMAL HIGH (ref 65–99)
POTASSIUM: 4 mmol/L (ref 3.5–5.1)
SODIUM: 132 mmol/L — AB (ref 135–145)

## 2017-02-19 MED ORDER — HEPARIN SODIUM (PORCINE) 5000 UNIT/ML IJ SOLN
4000.0000 [IU] | Freq: Once | INTRAMUSCULAR | Status: AC
  Administered 2017-02-19: 4000 [IU] via INTRAVENOUS

## 2017-02-19 MED ORDER — NITROGLYCERIN 0.4 MG SL SUBL
0.4000 mg | SUBLINGUAL_TABLET | SUBLINGUAL | Status: DC | PRN
Start: 2017-02-19 — End: 2017-02-20
  Administered 2017-02-19: 0.4 mg via SUBLINGUAL
  Filled 2017-02-19: qty 1

## 2017-02-19 MED ORDER — HEPARIN (PORCINE) IN NACL 100-0.45 UNIT/ML-% IJ SOLN
1000.0000 [IU]/h | INTRAMUSCULAR | Status: DC
Start: 2017-02-19 — End: 2017-02-20
  Administered 2017-02-19: 1000 [IU]/h via INTRAVENOUS
  Filled 2017-02-19: qty 250

## 2017-02-19 MED ORDER — IOPAMIDOL (ISOVUE-370) INJECTION 76%
75.0000 mL | Freq: Once | INTRAVENOUS | Status: AC | PRN
  Administered 2017-02-19: 75 mL via INTRAVENOUS

## 2017-02-19 MED ORDER — HEPARIN (PORCINE) IN NACL 100-0.45 UNIT/ML-% IJ SOLN: 12.0000 [IU]/kg/h | INTRAMUSCULAR | Status: DC

## 2017-02-19 MED ORDER — ASPIRIN 81 MG PO CHEW
324.0000 mg | CHEWABLE_TABLET | Freq: Once | ORAL | Status: AC
  Administered 2017-02-19: 324 mg via ORAL
  Filled 2017-02-19: qty 4

## 2017-02-19 MED ORDER — NITROGLYCERIN IN D5W 200-5 MCG/ML-% IV SOLN
0.0000 ug/min | Freq: Once | INTRAVENOUS | Status: AC
  Administered 2017-02-19: 40 ug/min via INTRAVENOUS
  Filled 2017-02-19: qty 250

## 2017-02-19 NOTE — ED Triage Notes (Signed)
Pt c/o left sided chest pain that radiates into back and down left arm. Pt reports she has had 3 episodes of this chest pain and in the last hour pain has increased and worsened. Pt contacted PCP and was referred to come to ED for further evaluation. Pt denies N/V but feels SOB.

## 2017-02-19 NOTE — ED Provider Notes (Addendum)
[facility] Emergency Department Provider Note  ____________________________________________  Time seen: Approximately 10:00 PM  I have reviewed the triage vital signs and the nursing notes.   HISTORY  Chief Complaint Chest Pain    HPI Stacey [name] is a 76 y.o. female with a history of hypertension, no known CAD, presenting with 3 weeks of chest pain. The patient and her husband describe that she has been having 2-3 daily episodes of a severe central "dullpressure" sensation that radiates into the neck and the bilateral arms and occasionally into the back. Positive associated shortness of breath but no diaphoresis, nausea or vomiting. No palpitations, lightheadedness or syncope.These episodes are not exertional and not related with food.Her chest pain is worse when she takes deep breaths. She has not noticed any lower external swelling or calf pain. When she has the episodes that night,she finds that her symptoms are improved if she sleeps with her knees on the ground and her head on the bed. The patient continues to have active chest pain at this time, which she has had since 7 PM without relief.   Past Medical History:  Diagnosis Date  . Arthritis   . Asthma   . Cancer ([place])   . Complication of anesthesia   . Depression   . Family history of adverse reaction to anesthesia    most of family - PONV  . Fibromyalgia   . Glaucoma   . Headache    migraines - 1-2x/mo  . Hypertension   . Motion sickness    all moving vehicles  . PONV (postoperative nausea and vomiting)   . Thyroid disease     Patient Active Problem List   Diagnosis Date Noted  . Personal history of colonic polyps   . Benign neoplasm of ascending colon   . Benign neoplasm of descending colon   . Chest pain 09/16/2015  . Disequilibrium 09/16/2015  . Panic attacks 09/11/2015  . Ganglion cyst 08/16/2015  . Hypernatremia 07/04/2015  . Upper back pain 07/04/2015  . Other  osteoarthritis involving multiple joints 02/06/2015  . Clinical depression 09/27/2014  . Dry mouth 09/27/2014  . Fibrositis 09/27/2014  . LBP (low back pain) 09/27/2014  . Avitaminosis D 09/27/2014  . Decreased leukocytes 09/27/2014  . Abnormal blood sugar 05/03/2009  . Insomnia 09/29/2007  . BP (high blood pressure) 03/09/2007  . Adaptation reaction 01/29/2007  . Acid reflux 10/07/2006  . Menopausal and postmenopausal disorder 10/05/2006  . Headache, migraine 10/05/2006  . Arthritis, degenerative 03/23/1994  . Adult hypothyroidism 04/15/1993  . Hypercholesteremia 03/25/1993    Past Surgical History:  Procedure Laterality Date  . ABDOMINAL HYSTERECTOMY    . APPENDECTOMY    . BLADDER SURGERY    . CATARACT EXTRACTION W/ INTRAOCULAR LENS  IMPLANT, BILATERAL    . COLONOSCOPY WITH PROPOFOL N/A 01/17/2016   Procedure: COLONOSCOPY WITH PROPOFOL;  Surgeon: [name], MD;  Location: [place];  Service: Endoscopy;  Laterality: N/A;  . NASAL SINUS SURGERY    . POLYPECTOMY  01/17/2016   Procedure: POLYPECTOMY;  Surgeon: [name], MD;  Location: [place];  Service: Endoscopy;;  . RIGHT OOPHORECTOMY      Current Outpatient Rx  . Order #: [id number] Class: Normal  . Order #: [id number] Class: Normal  . Order #: [id number] Class: Historical Med  . Order #: [id number] Class: Normal  . Order #: [id number] Class: Normal  . Order #: [id number] Class: Historical Med  . Order #: [id number] Class: Historical Med  . Order #: [id number] Class: Historical  Med  . Order #: [id number] Class: Print  . Order #: [id number] Class: Normal  . Order #: [id number] Class: Normal  . Order #: [id number] Class: Historical Med  . Order #: [id number] Class: Historical Med  . Order #: [id number] Class: Normal  . Order #: [id number] Class: Normal  . Order #: [id number] Class: Historical Med  . Order #: [id number] Class: Print    Allergies Cephalexin; Atorvastatin; Novocain [procaine]; Shellfish allergy; Statins;  Tomato; and Penicillins  Family History  Problem Relation Age of Onset  . Alzheimer's disease Mother   . Kidney cancer Mother   . Heart attack Father   . Pancreatic cancer Sister   . Heart attack Brother 107  . Ovarian cancer Sister   . Kidney disease Sister        dialysis  . Cancer Sister        uterin  . Diabetes Sister   . Heart attack Sister   . Heart attack Brother 58    Social History Social History  Substance Use Topics  . Smoking status: Former Smoker    Quit date: 05/26/1975  . Smokeless tobacco: Never Used     Comment: quit 45  years ago   . Alcohol use No    Review of Systems Constitutional: No fever/chills. No lightheadedness or syncope. Eyes: No visual changes. ENT: No sore throat. No congestion or rhinorrhea. Cardiovascular: positive centralchest pain that radiates into the neck, armsand back.. Denies palpitations. Respiratory: positive shortness of breath.  No cough. Gastrointestinal: No abdominal pain.  No nausea, no vomiting.  No diarrhea.  No constipation. Genitourinary: Negative for dysuria. Musculoskeletal: Negative for back pain. Skin: Negative for rash. Neurological: Negative for headaches. No focal numbness, tingling or weakness.     ____________________________________________   PHYSICAL EXAM:  VITAL SIGNS: ED Triage Vitals  Enc Vitals Group     BP 02/19/17 2020 (!) 171/83     Pulse Rate 02/19/17 2020 83     Resp 02/19/17 2020 17     Temp 02/19/17 2020 98.3 F (36.8 C)     Temp Source 02/19/17 2020 Oral     SpO2 02/19/17 2020 97 %     Weight 02/19/17 2015 180 lb (81.6 kg)     Height --      Head Circumference --      Peak Flow --      Pain Score --      Pain Loc --      Pain Edu? --      Excl. in [place]? --     Constitutional: Alert and oriented. Answers questions appropriately.the patient isn't comfortable appearing, hyperventilating, holding her chest. She is nontoxic. Eyes: Conjunctivae are normal.  EOMI. No scleral  icterus. Head: Atraumatic. Nose: No congestion/rhinnorhea. Mouth/Throat: Mucous membranes are moist.  Neck: No stridor.  Supple.  No JVD. No meningismus. Cardiovascular: Normal rate, regular rhythm. No murmurs, rubs or gallops.  Respiratory: achypnea with accessory muscle use but no retractions. Lungs CTAB.  No wheezes, rales or ronchi. Gastrointestinal: Soft, nontender and nondistended.  No guarding or rebound.  No peritoneal signs. Musculoskeletal: No LE edema. No ttp in the calves or palpable cords.  Negative Homan's sign. Neurologic:  A&Ox3.  Speech is clear.  Face and smile are symmetric.  EOMI.  Moves all extremities well. Skin:  Skin is warm, dry and intact. No rash noted. Psychiatric: Mood and affect are normal. Speech and behavior are normal.  Normal judgement  ____________________________________________   LABS (all labs ordered are listed, but only abnormal results are  displayed)  Labs Reviewed  BASIC METABOLIC PANEL - Abnormal; Notable for the following:       Result Value   Sodium 132 (*)    Chloride 94 (*)    Glucose, Bld 268 (*)    BUN 25 (*)    GFR calc non Af Amer 55 (*)    All other components within normal limits  TROPONIN I - Abnormal; Notable for the following:    Troponin I 0.07 (*)    All other components within normal limits  BRAIN NATRIURETIC PEPTIDE - Abnormal; Notable for the following:    B Natriuretic Peptide 479.0 (*)    All other components within normal limits  CBC   ____________________________________________  EKG  ED ECG REPORT I, Eula Listen, the attending physician, personally viewed and interpreted this ECG.   Date: 02/19/2017  EKG Time: 2010  Rate: 85  Rhythm: normal sinus rhythm  Axis: normal  Intervals:none  ST&T Change: No STEMI  ----------------------------------------- 11:25 PM on 02/19/2017 -----------------------------------------  ED ECG REPORT I, Eula Listen, the attending physician, personally  viewed and interpreted this ECG.   Date: 02/19/2017  EKG Time: 2310  Rate: 83  Rhythm: normal sinus rhythm  Axis: normal  Intervals:none  ST&T Change: No STEMI   ____________________________________________  RADIOLOGY  Dg Chest 2 View  Result Date: 02/19/2017 CLINICAL DATA:  Chest pain EXAM: CHEST  2 VIEW COMPARISON:  04/20/2014 FINDINGS: Hyperinflation. Mild cardiomegaly with aortic atherosclerosis. Moderate diffuse interstitial opacity possibly due to edema. Patchy more focal opacities at the lung bases could reflect superimposed pneumonia. No pneumothorax. IMPRESSION: 1. Moderate diffuse coarse interstitial opacity, could reflect mild underlying pulmonary edema ; patchy more confluent opacities at the lung bases may reflect superimposed pneumonia 2. Cardiomegaly with mild central congestion Electronically Signed   By: Donavan Foil M.D.   On: 02/19/2017 20:37   Dg Cervical Spine 2 Or 3 Views  Result Date: 02/19/2017 CLINICAL DATA:  Fall with neck pain EXAM: CERVICAL SPINE - 2-3 VIEW COMPARISON:  Cervical spine radiograph 05/10/2014 FINDINGS: There is narrowing of the intervertebral disc spaces of the lower cervical spine that is unchanged from the prior study. There is no fracture. Alignment is normal. IMPRESSION: Unchanged lower cervical degenerative disc disease compared to 05/10/2014. No acute abnormality. Electronically Signed   By: Ulyses Jarred M.D.   On: 02/19/2017 16:02   Dg Thoracic Spine W/swimmers  Result Date: 02/19/2017 CLINICAL DATA:  Fall with back pain EXAM: THORACIC SPINE - 3 VIEWS COMPARISON:  None. FINDINGS: There is no evidence of thoracic spine fracture. Alignment is normal. No other significant bone abnormalities are identified. There is calcific aortic atherosclerosis. IMPRESSION: No fracture or listhesis of the thoracic spine. Electronically Signed   By: Ulyses Jarred M.D.   On: 02/19/2017 16:00     ____________________________________________   PROCEDURES  Procedure(s) performed: None  Procedures  Critical Care performed: Yes ____________________________________________   INITIAL IMPRESSION / ASSESSMENT AND PLAN / ED COURSE  Pertinent labs & imaging results that were available during my care of the patient were reviewed by me and considered in my medical decision making (see chart for details).  76 y.o. Female with hypertension presenting with 3 weeks of intermittent episodes of chest pain, now with a chest pain episode that has not resolved. Overall, the patient is hypertensive and significantly uncomfortable looking. I'm concerned about ACS or MI. Her EKG does not show ischemic changes but she does have a positive troponin at 0.07. I am concerned about  ACS, but other causes of heart strain including PE could cause this elevation in troponin so a get a CT angiogram for further evaluation and rule out a PE. Aortic pathology is possible but less likely. Consider GI pathology. The patient will be given aspirin and nitroglycerin, but no further anticoagulation until her CT is complete and aortic pathology is ruled out. This patient will require admission.  ----------------------------------------- 11:24 PM on 02/19/2017 -----------------------------------------  I'm concerned that the patient is having an and STEMI, with a positive troponin and ongoing chest pain. She has significantly improved with 2 glycerin and is currently on a nitroglycerin drip. She also has an elevated BNP suggestive of CHF, whichalso explains the pulmonary opacities on her chest x-ray. She has no infectious symptoms, so these are unlikely to represent pneumonia or infection in the lung. She continues to be hemodynamically stable with this. Her repeat EKG does not show any ischemic changes. Her CT angiogram is negative for PE. At this time, we'll initiate heparinization and admission to the hospital for  further evaluation and treatment.  CRITICAL CARE Performed by: Eula Listen   Total critical care time: 45 minutes  Critical care time was exclusive of separately billable procedures and treating other patients.  Critical care was necessary to treat or prevent imminent or life-threatening deterioration.  Critical care was time spent personally by me on the following activities: development of treatment plan with patient and/or surrogate as well as nursing, discussions with consultants, evaluation of patient's response to treatment, examination of patient, obtaining history from patient or surrogate, ordering and performing treatments and interventions, ordering and review of laboratory studies, ordering and review of radiographic studies, pulse oximetry and re-evaluation of patient's condition.   ____________________________________________  FINAL CLINICAL IMPRESSION(S) / ED DIAGNOSES  Final diagnoses:  NSTEMI (non-ST elevated myocardial infarction) ([place])         NEW MEDICATIONS STARTED DURING THIS VISIT:  New Prescriptions   No medications on file      Eula Listen, MD 02/19/17 2325    Eula Listen, MD 02/19/17 2326

## 2017-02-20 ENCOUNTER — Inpatient Hospital Stay (HOSPITAL_COMMUNITY): Payer: Medicare Other

## 2017-02-20 ENCOUNTER — Inpatient Hospital Stay (HOSPITAL_COMMUNITY): Payer: Medicare Other | Admitting: Certified Registered Nurse Anesthetist

## 2017-02-20 ENCOUNTER — Ambulatory Visit: Payer: Medicare Other

## 2017-02-20 ENCOUNTER — Encounter: Payer: Self-pay | Admitting: Internal Medicine

## 2017-02-20 ENCOUNTER — Other Ambulatory Visit: Payer: Self-pay

## 2017-02-20 ENCOUNTER — Encounter
Admission: EM | Disposition: A | Discharge status: Other Acute Inpatient Hospital | Payer: Self-pay | Source: Home / Self Care | Attending: Internal Medicine

## 2017-02-20 ENCOUNTER — Inpatient Hospital Stay (HOSPITAL_COMMUNITY)
Admission: AD | Admit: 2017-02-20 | Discharge: 2017-02-28 | DRG: 236 | Disposition: A | Discharge status: Home Health | Payer: Medicare Other | Source: Ambulatory Visit | Attending: Cardiothoracic Surgery | Admitting: Cardiothoracic Surgery

## 2017-02-20 ENCOUNTER — Inpatient Hospital Stay: Admit: 2017-02-20 | Payer: Medicare Other

## 2017-02-20 ENCOUNTER — Encounter (HOSPITAL_COMMUNITY): Payer: Self-pay | Admitting: *Deleted

## 2017-02-20 ENCOUNTER — Inpatient Hospital Stay (HOSPITAL_COMMUNITY)
Admission: AD | Disposition: A | Discharge status: Home Health | Payer: Self-pay | Source: Ambulatory Visit | Attending: Cardiothoracic Surgery

## 2017-02-20 DIAGNOSIS — I10 Essential (primary) hypertension: Secondary | ICD-10-CM | POA: Diagnosis present

## 2017-02-20 DIAGNOSIS — Z9841 Cataract extraction status, right eye: Secondary | ICD-10-CM

## 2017-02-20 DIAGNOSIS — Z86010 Personal history of colonic polyps: Secondary | ICD-10-CM | POA: Diagnosis not present

## 2017-02-20 DIAGNOSIS — H409 Unspecified glaucoma: Secondary | ICD-10-CM | POA: Diagnosis not present

## 2017-02-20 DIAGNOSIS — Z800 Family history of malignant neoplasm of digestive organs: Secondary | ICD-10-CM | POA: Diagnosis not present

## 2017-02-20 DIAGNOSIS — Z961 Presence of intraocular lens: Secondary | ICD-10-CM | POA: Diagnosis not present

## 2017-02-20 DIAGNOSIS — Z09 Encounter for follow-up examination after completed treatment for conditions other than malignant neoplasm: Secondary | ICD-10-CM

## 2017-02-20 DIAGNOSIS — E119 Type 2 diabetes mellitus without complications: Secondary | ICD-10-CM | POA: Diagnosis not present

## 2017-02-20 DIAGNOSIS — J45909 Unspecified asthma, uncomplicated: Secondary | ICD-10-CM | POA: Diagnosis present

## 2017-02-20 DIAGNOSIS — Z9842 Cataract extraction status, left eye: Secondary | ICD-10-CM | POA: Diagnosis not present

## 2017-02-20 DIAGNOSIS — Z951 Presence of aortocoronary bypass graft: Secondary | ICD-10-CM

## 2017-02-20 DIAGNOSIS — M797 Fibromyalgia: Secondary | ICD-10-CM | POA: Diagnosis not present

## 2017-02-20 DIAGNOSIS — I25110 Atherosclerotic heart disease of native coronary artery with unstable angina pectoris: Secondary | ICD-10-CM | POA: Diagnosis not present

## 2017-02-20 DIAGNOSIS — E7800 Pure hypercholesterolemia, unspecified: Secondary | ICD-10-CM | POA: Diagnosis present

## 2017-02-20 DIAGNOSIS — G43909 Migraine, unspecified, not intractable, without status migrainosus: Secondary | ICD-10-CM | POA: Diagnosis present

## 2017-02-20 DIAGNOSIS — I252 Old myocardial infarction: Secondary | ICD-10-CM | POA: Diagnosis not present

## 2017-02-20 DIAGNOSIS — Z91013 Allergy to seafood: Secondary | ICD-10-CM | POA: Diagnosis not present

## 2017-02-20 DIAGNOSIS — Z9689 Presence of other specified functional implants: Secondary | ICD-10-CM

## 2017-02-20 DIAGNOSIS — E871 Hypo-osmolality and hyponatremia: Secondary | ICD-10-CM | POA: Diagnosis not present

## 2017-02-20 DIAGNOSIS — Z90710 Acquired absence of both cervix and uterus: Secondary | ICD-10-CM | POA: Diagnosis not present

## 2017-02-20 DIAGNOSIS — Z885 Allergy status to narcotic agent status: Secondary | ICD-10-CM | POA: Diagnosis not present

## 2017-02-20 DIAGNOSIS — Z841 Family history of disorders of kidney and ureter: Secondary | ICD-10-CM | POA: Diagnosis not present

## 2017-02-20 DIAGNOSIS — Z820 Family history of epilepsy and other diseases of the nervous system: Secondary | ICD-10-CM | POA: Diagnosis not present

## 2017-02-20 DIAGNOSIS — I2510 Atherosclerotic heart disease of native coronary artery without angina pectoris: Secondary | ICD-10-CM | POA: Diagnosis not present

## 2017-02-20 DIAGNOSIS — J90 Pleural effusion, not elsewhere classified: Secondary | ICD-10-CM | POA: Diagnosis not present

## 2017-02-20 DIAGNOSIS — I509 Heart failure, unspecified: Secondary | ICD-10-CM | POA: Diagnosis not present

## 2017-02-20 DIAGNOSIS — I081 Rheumatic disorders of both mitral and tricuspid valves: Secondary | ICD-10-CM | POA: Diagnosis not present

## 2017-02-20 DIAGNOSIS — R682 Dry mouth, unspecified: Secondary | ICD-10-CM | POA: Diagnosis present

## 2017-02-20 DIAGNOSIS — Z880 Allergy status to penicillin: Secondary | ICD-10-CM | POA: Diagnosis not present

## 2017-02-20 DIAGNOSIS — Z888 Allergy status to other drugs, medicaments and biological substances status: Secondary | ICD-10-CM | POA: Diagnosis not present

## 2017-02-20 DIAGNOSIS — Z87891 Personal history of nicotine dependence: Secondary | ICD-10-CM

## 2017-02-20 DIAGNOSIS — Z833 Family history of diabetes mellitus: Secondary | ICD-10-CM | POA: Diagnosis not present

## 2017-02-20 DIAGNOSIS — I214 Non-ST elevation (NSTEMI) myocardial infarction: Secondary | ICD-10-CM

## 2017-02-20 DIAGNOSIS — I200 Unstable angina: Secondary | ICD-10-CM | POA: Diagnosis not present

## 2017-02-20 DIAGNOSIS — Z8051 Family history of malignant neoplasm of kidney: Secondary | ICD-10-CM | POA: Diagnosis not present

## 2017-02-20 DIAGNOSIS — Z8249 Family history of ischemic heart disease and other diseases of the circulatory system: Secondary | ICD-10-CM | POA: Diagnosis not present

## 2017-02-20 DIAGNOSIS — D62 Acute posthemorrhagic anemia: Secondary | ICD-10-CM | POA: Diagnosis not present

## 2017-02-20 DIAGNOSIS — J9811 Atelectasis: Secondary | ICD-10-CM | POA: Diagnosis not present

## 2017-02-20 DIAGNOSIS — F3342 Major depressive disorder, recurrent, in full remission: Secondary | ICD-10-CM

## 2017-02-20 DIAGNOSIS — Z91018 Allergy to other foods: Secondary | ICD-10-CM

## 2017-02-20 DIAGNOSIS — I480 Paroxysmal atrial fibrillation: Secondary | ICD-10-CM | POA: Diagnosis not present

## 2017-02-20 DIAGNOSIS — M1990 Unspecified osteoarthritis, unspecified site: Secondary | ICD-10-CM | POA: Diagnosis present

## 2017-02-20 DIAGNOSIS — R079 Chest pain, unspecified: Secondary | ICD-10-CM | POA: Diagnosis not present

## 2017-02-20 DIAGNOSIS — J939 Pneumothorax, unspecified: Secondary | ICD-10-CM | POA: Diagnosis not present

## 2017-02-20 DIAGNOSIS — Z4682 Encounter for fitting and adjustment of non-vascular catheter: Secondary | ICD-10-CM | POA: Diagnosis not present

## 2017-02-20 DIAGNOSIS — Z884 Allergy status to anesthetic agent status: Secondary | ICD-10-CM | POA: Diagnosis not present

## 2017-02-20 DIAGNOSIS — I25119 Atherosclerotic heart disease of native coronary artery with unspecified angina pectoris: Secondary | ICD-10-CM | POA: Diagnosis present

## 2017-02-20 DIAGNOSIS — E039 Hypothyroidism, unspecified: Secondary | ICD-10-CM | POA: Diagnosis present

## 2017-02-20 DIAGNOSIS — Z8041 Family history of malignant neoplasm of ovary: Secondary | ICD-10-CM | POA: Diagnosis not present

## 2017-02-20 DIAGNOSIS — I219 Acute myocardial infarction, unspecified: Secondary | ICD-10-CM | POA: Diagnosis not present

## 2017-02-20 HISTORY — PX: CORONARY ARTERY BYPASS GRAFT: SHX141

## 2017-02-20 HISTORY — PX: TEE WITHOUT CARDIOVERSION: SHX5443

## 2017-02-20 HISTORY — PX: LEFT HEART CATH AND CORONARY ANGIOGRAPHY: CATH118249

## 2017-02-20 HISTORY — DX: Non-ST elevation (NSTEMI) myocardial infarction: I21.4

## 2017-02-20 LAB — POCT I-STAT 3, ART BLOOD GAS (G3+)
Acid-Base Excess: 5 mmol/L — ABNORMAL HIGH (ref 0.0–2.0)
Acid-Base Excess: 1 mmol/L (ref 0.0–2.0)
Bicarbonate: 28.5 mmol/L — ABNORMAL HIGH (ref 20.0–28.0)
Bicarbonate: 26.3 mmol/L (ref 20.0–28.0)
Bicarbonate: 25.4 mmol/L (ref 20.0–28.0)
O2 Saturation: 100 %
O2 Saturation: 100 %
O2 Saturation: 96 %
Patient temperature: 36
TCO2: 30 mmol/L (ref 22–32)
TCO2: 28 mmol/L (ref 22–32)
TCO2: 27 mmol/L (ref 22–32)
pCO2 arterial: 37.6 mmHg (ref 32.0–48.0)
pCO2 arterial: 44.6 mmHg (ref 32.0–48.0)
pCO2 arterial: 44.1 mmHg (ref 32.0–48.0)
pH, Arterial: 7.488 — ABNORMAL HIGH (ref 7.350–7.450)
pH, Arterial: 7.379 (ref 7.350–7.450)
pH, Arterial: 7.364 (ref 7.350–7.450)
pO2, Arterial: 346 mmHg — ABNORMAL HIGH (ref 83.0–108.0)
pO2, Arterial: 176 mmHg — ABNORMAL HIGH (ref 83.0–108.0)
pO2, Arterial: 85 mmHg (ref 83.0–108.0)

## 2017-02-20 LAB — CBC
HCT: 34 % — ABNORMAL LOW (ref 35.0–47.0)
HEMATOCRIT: 30.6 % — AB (ref 36.0–46.0)
Hemoglobin: 12 g/dL (ref 12.0–16.0)
Hemoglobin: 10 g/dL — ABNORMAL LOW (ref 12.0–15.0)
MCH: 29.9 pg (ref 26.0–34.0)
MCH: 28.3 pg (ref 26.0–34.0)
MCHC: 35.2 g/dL (ref 32.0–36.0)
MCHC: 32.7 g/dL (ref 30.0–36.0)
MCV: 85 fL (ref 80.0–100.0)
MCV: 86.7 fL (ref 78.0–100.0)
PLATELETS: 188 10*3/uL (ref 150–440)
PLATELETS: 172 10*3/uL (ref 150–400)
RBC: 4 MIL/uL (ref 3.80–5.20)
RBC: 3.53 MIL/uL — ABNORMAL LOW (ref 3.87–5.11)
RDW: 13.1 % (ref 11.5–14.5)
RDW: 13 % (ref 11.5–15.5)
WBC: 8.5 10*3/uL (ref 3.6–11.0)
WBC: 19.3 10*3/uL — AB (ref 4.0–10.5)

## 2017-02-20 LAB — POCT ACTIVATED CLOTTING TIME: Activated Clotting Time: 114 seconds

## 2017-02-20 LAB — POCT I-STAT, CHEM 8
BUN: 20 mg/dL (ref 6–20)
BUN: 19 mg/dL (ref 6–20)
BUN: 16 mg/dL (ref 6–20)
BUN: 17 mg/dL (ref 6–20)
BUN: 17 mg/dL (ref 6–20)
Calcium, Ion: 1.17 mmol/L (ref 1.15–1.40)
Calcium, Ion: 1.14 mmol/L — ABNORMAL LOW (ref 1.15–1.40)
Calcium, Ion: 0.97 mmol/L — ABNORMAL LOW (ref 1.15–1.40)
Calcium, Ion: 1 mmol/L — ABNORMAL LOW (ref 1.15–1.40)
Calcium, Ion: 1.03 mmol/L — ABNORMAL LOW (ref 1.15–1.40)
Chloride: 98 mmol/L — ABNORMAL LOW (ref 101–111)
Chloride: 99 mmol/L — ABNORMAL LOW (ref 101–111)
Chloride: 98 mmol/L — ABNORMAL LOW (ref 101–111)
Chloride: 98 mmol/L — ABNORMAL LOW (ref 101–111)
Chloride: 99 mmol/L — ABNORMAL LOW (ref 101–111)
Creatinine, Ser: 0.7 mg/dL (ref 0.44–1.00)
Creatinine, Ser: 0.8 mg/dL (ref 0.44–1.00)
Creatinine, Ser: 0.6 mg/dL (ref 0.44–1.00)
Creatinine, Ser: 0.6 mg/dL (ref 0.44–1.00)
Creatinine, Ser: 0.7 mg/dL (ref 0.44–1.00)
Glucose, Bld: 144 mg/dL — ABNORMAL HIGH (ref 65–99)
Glucose, Bld: 137 mg/dL — ABNORMAL HIGH (ref 65–99)
Glucose, Bld: 119 mg/dL — ABNORMAL HIGH (ref 65–99)
Glucose, Bld: 149 mg/dL — ABNORMAL HIGH (ref 65–99)
Glucose, Bld: 134 mg/dL — ABNORMAL HIGH (ref 65–99)
HCT: 33 % — ABNORMAL LOW (ref 36.0–46.0)
HCT: 31 % — ABNORMAL LOW (ref 36.0–46.0)
HCT: 26 % — ABNORMAL LOW (ref 36.0–46.0)
HCT: 25 % — ABNORMAL LOW (ref 36.0–46.0)
HCT: 26 % — ABNORMAL LOW (ref 36.0–46.0)
Hemoglobin: 11.2 g/dL — ABNORMAL LOW (ref 12.0–15.0)
Hemoglobin: 10.5 g/dL — ABNORMAL LOW (ref 12.0–15.0)
Hemoglobin: 8.8 g/dL — ABNORMAL LOW (ref 12.0–15.0)
Hemoglobin: 8.5 g/dL — ABNORMAL LOW (ref 12.0–15.0)
Hemoglobin: 8.8 g/dL — ABNORMAL LOW (ref 12.0–15.0)
Potassium: 3.8 mmol/L (ref 3.5–5.1)
Potassium: 4 mmol/L (ref 3.5–5.1)
Potassium: 4.3 mmol/L (ref 3.5–5.1)
Potassium: 4.9 mmol/L (ref 3.5–5.1)
Potassium: 4.2 mmol/L (ref 3.5–5.1)
Sodium: 138 mmol/L (ref 135–145)
Sodium: 137 mmol/L (ref 135–145)
Sodium: 137 mmol/L (ref 135–145)
Sodium: 134 mmol/L — ABNORMAL LOW (ref 135–145)
Sodium: 137 mmol/L (ref 135–145)
TCO2: 27 mmol/L (ref 22–32)
TCO2: 26 mmol/L (ref 22–32)
TCO2: 28 mmol/L (ref 22–32)
TCO2: 28 mmol/L (ref 22–32)
TCO2: 26 mmol/L (ref 22–32)

## 2017-02-20 LAB — BASIC METABOLIC PANEL
Anion gap: 8 (ref 5–15)
BUN: 20 mg/dL (ref 6–20)
CO2: 26 mmol/L (ref 22–32)
Calcium: 9 mg/dL (ref 8.9–10.3)
Chloride: 101 mmol/L (ref 101–111)
Creatinine, Ser: 0.79 mg/dL (ref 0.44–1.00)
GFR calc Af Amer: >60 mL/min (ref >60)
GLUCOSE: 146 mg/dL — AB (ref 65–99)
POTASSIUM: 4.1 mmol/L (ref 3.5–5.1)
Sodium: 135 mmol/L (ref 135–145)

## 2017-02-20 LAB — ECHO INTRAOPERATIVE TEE
Height: 62 in
Weight: 2899.49 oz

## 2017-02-20 LAB — GLUCOSE, CAPILLARY
GLUCOSE-CAPILLARY: 155 mg/dL — AB (ref 65–99)
Glucose-Capillary: 153 mg/dL — ABNORMAL HIGH (ref 65–99)
Glucose-Capillary: 144 mg/dL — ABNORMAL HIGH (ref 65–99)
Glucose-Capillary: 121 mg/dL — ABNORMAL HIGH (ref 65–99)
Glucose-Capillary: 113 mg/dL — ABNORMAL HIGH (ref 65–99)

## 2017-02-20 LAB — SURGICAL PCR SCREEN
MRSA, PCR: NEGATIVE
Staphylococcus aureus: NEGATIVE

## 2017-02-20 LAB — HEMOGLOBIN AND HEMATOCRIT, BLOOD
HCT: 24.2 % — ABNORMAL LOW (ref 36.0–46.0)
Hemoglobin: 8.4 g/dL — ABNORMAL LOW (ref 12.0–15.0)

## 2017-02-20 LAB — PROTIME-INR
INR: 1.06
INR: 1.38
PROTHROMBIN TIME: 13.7 s (ref 11.4–15.2)
Prothrombin Time: 16.9 seconds — ABNORMAL HIGH (ref 11.4–15.2)

## 2017-02-20 LAB — LIPID PANEL
CHOL/HDL RATIO: 3.2 ratio
Cholesterol: 183 mg/dL (ref 0–200)
HDL: 58 mg/dL (ref >40)
LDL CALC: 106 mg/dL — AB (ref 0–99)
Triglycerides: 96 mg/dL (ref <150)
VLDL: 19 mg/dL (ref 0–40)

## 2017-02-20 LAB — POCT I-STAT 4, (NA,K, GLUC, HGB,HCT)
Glucose, Bld: 134 mg/dL — ABNORMAL HIGH (ref 65–99)
HCT: 28 % — ABNORMAL LOW (ref 36.0–46.0)
Hemoglobin: 9.5 g/dL — ABNORMAL LOW (ref 12.0–15.0)
Potassium: 4 mmol/L (ref 3.5–5.1)
Sodium: 140 mmol/L (ref 135–145)

## 2017-02-20 LAB — ABO/RH: ABO/RH(D): A POS

## 2017-02-20 LAB — PLATELET COUNT: Platelets: 163 10*3/uL (ref 150–400)

## 2017-02-20 LAB — TROPONIN I
TROPONIN I: 6.38 ng/mL — AB (ref <0.03)
TROPONIN I: 1.2 ng/mL — AB (ref <0.03)
TROPONIN I: 4.34 ng/mL — AB (ref <0.03)

## 2017-02-20 LAB — APTT: APTT: 31 s (ref 24–36)

## 2017-02-20 LAB — HEPARIN LEVEL (UNFRACTIONATED): Heparin Unfractionated: 0.38 IU/mL (ref 0.30–0.70)

## 2017-02-20 LAB — MRSA PCR SCREENING: MRSA by PCR: NEGATIVE

## 2017-02-20 SURGERY — LEFT HEART CATH AND CORONARY ANGIOGRAPHY
Anesthesia: Moderate Sedation

## 2017-02-20 SURGERY — CORONARY ARTERY BYPASS GRAFTING (CABG)
Anesthesia: General | Site: Chest

## 2017-02-20 MED ORDER — HEPARIN (PORCINE) IN NACL 2-0.9 UNIT/ML-% IJ SOLN
INTRAMUSCULAR | Status: AC
  Filled 2017-02-20: qty 1000

## 2017-02-20 MED ORDER — ASPIRIN 81 MG PO CHEW: 324.0000 mg | CHEWABLE_TABLET | ORAL | Status: DC

## 2017-02-20 MED ORDER — CHLORHEXIDINE GLUCONATE 0.12 % MT SOLN
15.0000 mL | OROMUCOSAL | Status: AC
  Administered 2017-02-20: 15 mL via OROMUCOSAL

## 2017-02-20 MED ORDER — MIDAZOLAM HCL 2 MG/2ML IJ SOLN: 2.0000 mg | INTRAMUSCULAR | Status: DC | PRN

## 2017-02-20 MED ORDER — MAGNESIUM SULFATE 4 GM/100ML IV SOLN
4.0000 g | Freq: Once | INTRAVENOUS | Status: AC
  Administered 2017-02-20: 4 g via INTRAVENOUS
  Filled 2017-02-20: qty 100

## 2017-02-20 MED ORDER — NITROGLYCERIN IN D5W 200-5 MCG/ML-% IV SOLN
INTRAVENOUS | Status: DC | PRN
  Administered 2017-02-20: 50 ug/min via INTRAVENOUS

## 2017-02-20 MED ORDER — THROMBIN 20000 UNITS EX SOLR
CUTANEOUS | Status: AC
  Filled 2017-02-20: qty 20000

## 2017-02-20 MED ORDER — SODIUM CHLORIDE 0.9 % IV SOLN
INTRAVENOUS | Status: DC
  Filled 2017-02-20: qty 1

## 2017-02-20 MED ORDER — NEBIVOLOL HCL 10 MG PO TABS
20.0000 mg | ORAL_TABLET | Freq: Every day | ORAL | Status: DC
  Filled 2017-02-20: qty 2

## 2017-02-20 MED ORDER — ALBUTEROL SULFATE HFA 108 (90 BASE) MCG/ACT IN AERS
INHALATION_SPRAY | RESPIRATORY_TRACT | Status: AC
  Filled 2017-02-20: qty 6.7

## 2017-02-20 MED ORDER — FENTANYL CITRATE (PF) 100 MCG/2ML IJ SOLN
INTRAMUSCULAR | Status: DC | PRN
  Administered 2017-02-20: 100 ug via INTRAVENOUS
  Administered 2017-02-20: 150 ug via INTRAVENOUS
  Administered 2017-02-20: 50 ug via INTRAVENOUS
  Administered 2017-02-20 (×2): 100 ug via INTRAVENOUS
  Administered 2017-02-20: 50 ug via INTRAVENOUS
  Administered 2017-02-20: 150 ug via INTRAVENOUS
  Administered 2017-02-20: 200 ug via INTRAVENOUS
  Administered 2017-02-20: 150 ug via INTRAVENOUS
  Administered 2017-02-20 (×2): 100 ug via INTRAVENOUS

## 2017-02-20 MED ORDER — SODIUM CHLORIDE 0.9 % IV SOLN: 250.0000 mL | INTRAVENOUS | Status: DC

## 2017-02-20 MED ORDER — SODIUM CHLORIDE 0.9 % IV SOLN
INTRAVENOUS | Status: DC | PRN
  Administered 2017-02-20: 1250 mg via INTRAVENOUS

## 2017-02-20 MED ORDER — ZOLPIDEM TARTRATE 5 MG PO TABS: 5.0000 mg | ORAL_TABLET | Freq: Every evening | ORAL | Status: DC | PRN

## 2017-02-20 MED ORDER — TRANEXAMIC ACID (OHS) BOLUS VIA INFUSION
15.0000 mg/kg | INTRAVENOUS | Status: DC
  Filled 2017-02-20: qty 1233

## 2017-02-20 MED ORDER — LACTATED RINGERS IV SOLN
INTRAVENOUS | Status: DC | PRN
  Administered 2017-02-20: 13:00:00 via INTRAVENOUS

## 2017-02-20 MED ORDER — METOCLOPRAMIDE HCL 5 MG/ML IJ SOLN
10.0000 mg | Freq: Four times a day (QID) | INTRAMUSCULAR | Status: AC
  Administered 2017-02-21 (×3): 10 mg via INTRAVENOUS
  Filled 2017-02-20 (×3): qty 2

## 2017-02-20 MED ORDER — ALBUTEROL SULFATE HFA 108 (90 BASE) MCG/ACT IN AERS
INHALATION_SPRAY | RESPIRATORY_TRACT | Status: DC | PRN
  Administered 2017-02-20: 4 via RESPIRATORY_TRACT

## 2017-02-20 MED ORDER — DEXTROSE 5 % IV SOLN
0.0000 ug/min | INTRAVENOUS | Status: DC
Start: 2017-02-20 — End: 2017-02-20
  Filled 2017-02-20: qty 4

## 2017-02-20 MED ORDER — POTASSIUM CHLORIDE 2 MEQ/ML IV SOLN
80.0000 meq | INTRAVENOUS | Status: DC
  Filled 2017-02-20 (×2): qty 40

## 2017-02-20 MED ORDER — LEVOTHYROXINE SODIUM 88 MCG PO TABS
88.0000 ug | ORAL_TABLET | Freq: Every day | ORAL | Status: DC
  Administered 2017-02-20: 88 ug via ORAL
  Filled 2017-02-20: qty 1

## 2017-02-20 MED ORDER — ETOMIDATE 2 MG/ML IV SOLN
INTRAVENOUS | Status: DC | PRN
  Administered 2017-02-20: 16 mg via INTRAVENOUS

## 2017-02-20 MED ORDER — ROCURONIUM BROMIDE 100 MG/10ML IV SOLN
INTRAVENOUS | Status: DC | PRN
  Administered 2017-02-20: 20 mg via INTRAVENOUS
  Administered 2017-02-20: 100 mg via INTRAVENOUS
  Administered 2017-02-20: 20 mg via INTRAVENOUS

## 2017-02-20 MED ORDER — ORAL CARE MOUTH RINSE
15.0000 mL | Freq: Four times a day (QID) | OROMUCOSAL | Status: DC
  Administered 2017-02-21 – 2017-02-22 (×7): 15 mL via OROMUCOSAL

## 2017-02-20 MED ORDER — ESCITALOPRAM OXALATE 10 MG PO TABS
10.0000 mg | ORAL_TABLET | Freq: Every day | ORAL | Status: DC
Start: 2017-02-20 — End: 2017-02-28
  Administered 2017-02-21 – 2017-02-28 (×8): 10 mg via ORAL
  Filled 2017-02-20 (×8): qty 1

## 2017-02-20 MED ORDER — PROTAMINE SULFATE 10 MG/ML IV SOLN
INTRAVENOUS | Status: AC
  Filled 2017-02-20: qty 25

## 2017-02-20 MED ORDER — ACETAMINOPHEN 325 MG PO TABS
650.0000 mg | ORAL_TABLET | ORAL | Status: DC | PRN
  Administered 2017-02-20: 650 mg via ORAL
  Filled 2017-02-20: qty 2

## 2017-02-20 MED ORDER — MILRINONE LACTATE IN DEXTROSE 20-5 MG/100ML-% IV SOLN
0.2000 ug/kg/min | INTRAVENOUS | Status: DC
  Administered 2017-02-21 (×2): 0.2 ug/kg/min via INTRAVENOUS
  Filled 2017-02-20 (×2): qty 100

## 2017-02-20 MED ORDER — BUPROPION HCL ER (XL) 150 MG PO TB24
150.0000 mg | ORAL_TABLET | Freq: Every day | ORAL | Status: DC
  Administered 2017-02-21 – 2017-02-28 (×8): 150 mg via ORAL
  Filled 2017-02-20 (×8): qty 1

## 2017-02-20 MED ORDER — SODIUM CHLORIDE 0.9 % IV SOLN: 30.0000 ug/min | INTRAVENOUS | Status: DC

## 2017-02-20 MED ORDER — ASPIRIN 300 MG RE SUPP: 300.0000 mg | RECTAL | Status: DC

## 2017-02-20 MED ORDER — TRANEXAMIC ACID (OHS) BOLUS VIA INFUSION: 15.0000 mg/kg | INTRAVENOUS | Status: DC

## 2017-02-20 MED ORDER — FENOFIBRATE 54 MG PO TABS
54.0000 mg | ORAL_TABLET | Freq: Every day | ORAL | Status: DC
  Administered 2017-02-21 – 2017-02-28 (×8): 54 mg via ORAL
  Filled 2017-02-20 (×8): qty 1

## 2017-02-20 MED ORDER — ALBUMIN HUMAN 5 % IV SOLN
INTRAVENOUS | Status: DC | PRN
Start: 2017-02-20 — End: 2017-02-20
  Administered 2017-02-20 (×2): via INTRAVENOUS

## 2017-02-20 MED ORDER — PROTAMINE SULFATE 10 MG/ML IV SOLN
INTRAVENOUS | Status: DC | PRN
  Administered 2017-02-20 (×4): 25 mg via INTRAVENOUS
  Administered 2017-02-20: 10 mg via INTRAVENOUS
  Administered 2017-02-20: 15 mg via INTRAVENOUS
  Administered 2017-02-20 (×4): 30 mg via INTRAVENOUS
  Administered 2017-02-20: 20 mg via INTRAVENOUS
  Administered 2017-02-20: 25 mg via INTRAVENOUS

## 2017-02-20 MED ORDER — LEVOFLOXACIN IN D5W 500 MG/100ML IV SOLN
500.0000 mg | INTRAVENOUS | Status: DC
  Filled 2017-02-20 (×2): qty 100

## 2017-02-20 MED ORDER — LEVOFLOXACIN IN D5W 750 MG/150ML IV SOLN
750.0000 mg | INTRAVENOUS | Status: AC
  Administered 2017-02-21: 750 mg via INTRAVENOUS
  Filled 2017-02-20: qty 150

## 2017-02-20 MED ORDER — DOPAMINE-DEXTROSE 3.2-5 MG/ML-% IV SOLN
0.0000 ug/kg/min | INTRAVENOUS | Status: DC
  Filled 2017-02-20: qty 250

## 2017-02-20 MED ORDER — PROTAMINE SULFATE 10 MG/ML IV SOLN
INTRAVENOUS | Status: AC
  Filled 2017-02-20: qty 5

## 2017-02-20 MED ORDER — SODIUM CHLORIDE 0.9 % IV SOLN: INTRAVENOUS | Status: DC

## 2017-02-20 MED ORDER — LACTATED RINGERS IV SOLN: INTRAVENOUS | Status: DC

## 2017-02-20 MED ORDER — NITROGLYCERIN IN D5W 200-5 MCG/ML-% IV SOLN
2.0000 ug/min | INTRAVENOUS | Status: DC
  Filled 2017-02-20: qty 250

## 2017-02-20 MED ORDER — MORPHINE SULFATE (PF) 2 MG/ML IV SOLN: 2.0000 mg | INTRAVENOUS | Status: DC | PRN

## 2017-02-20 MED ORDER — SODIUM CHLORIDE 0.9 % IV SOLN
0.0000 ug/min | INTRAVENOUS | Status: DC
  Administered 2017-02-21: 90 ug/min via INTRAVENOUS
  Administered 2017-02-21: 60 ug/min via INTRAVENOUS
  Administered 2017-02-21: 100 ug/min via INTRAVENOUS
  Administered 2017-02-21: 90 ug/min via INTRAVENOUS
  Administered 2017-02-21 (×2): 60 ug/min via INTRAVENOUS
  Filled 2017-02-20 (×7): qty 2

## 2017-02-20 MED ORDER — METOPROLOL TARTRATE 25 MG/10 ML ORAL SUSPENSION: 12.5000 mg | Freq: Two times a day (BID) | ORAL | Status: DC

## 2017-02-20 MED ORDER — PHENYLEPHRINE 40 MCG/ML (10ML) SYRINGE FOR IV PUSH (FOR BLOOD PRESSURE SUPPORT)
PREFILLED_SYRINGE | INTRAVENOUS | Status: AC
  Filled 2017-02-20: qty 20

## 2017-02-20 MED ORDER — FENTANYL CITRATE (PF) 100 MCG/2ML IJ SOLN
INTRAMUSCULAR | Status: AC
  Filled 2017-02-20: qty 2

## 2017-02-20 MED ORDER — ALBUMIN HUMAN 5 % IV SOLN
250.0000 mL | INTRAVENOUS | Status: AC | PRN
  Administered 2017-02-20 – 2017-02-21 (×4): 250 mL via INTRAVENOUS
  Filled 2017-02-20 (×2): qty 250

## 2017-02-20 MED ORDER — ROCURONIUM BROMIDE 10 MG/ML (PF) SYRINGE
PREFILLED_SYRINGE | INTRAVENOUS | Status: AC
  Filled 2017-02-20: qty 10

## 2017-02-20 MED ORDER — EPINEPHRINE PF 1 MG/ML IJ SOLN: 0.0000 ug/min | INTRAVENOUS | Status: DC

## 2017-02-20 MED ORDER — BISACODYL 5 MG PO TBEC
10.0000 mg | DELAYED_RELEASE_TABLET | Freq: Every day | ORAL | Status: DC
  Administered 2017-02-21 – 2017-02-25 (×5): 10 mg via ORAL
  Filled 2017-02-20 (×5): qty 2

## 2017-02-20 MED ORDER — ASPIRIN 81 MG PO CHEW: 324.0000 mg | CHEWABLE_TABLET | Freq: Every day | ORAL | Status: DC

## 2017-02-20 MED ORDER — PLASMA-LYTE 148 IV SOLN
INTRAVENOUS | Status: AC
  Administered 2017-02-20: 14:00:00
  Filled 2017-02-20: qty 2.5

## 2017-02-20 MED ORDER — SODIUM CHLORIDE 0.9 % IV SOLN: 1.5000 mg/kg/h | INTRAVENOUS | Status: DC

## 2017-02-20 MED ORDER — ONDANSETRON HCL 4 MG/2ML IJ SOLN
4.0000 mg | Freq: Four times a day (QID) | INTRAMUSCULAR | Status: DC | PRN
  Administered 2017-02-22 – 2017-02-23 (×2): 4 mg via INTRAVENOUS
  Filled 2017-02-20 (×2): qty 2

## 2017-02-20 MED ORDER — SODIUM CHLORIDE 0.9% FLUSH: 10.0000 mL | INTRAVENOUS | Status: DC | PRN

## 2017-02-20 MED ORDER — INSULIN REGULAR BOLUS VIA INFUSION
0.0000 [IU] | Freq: Three times a day (TID) | INTRAVENOUS | Status: DC
  Filled 2017-02-20: qty 10

## 2017-02-20 MED ORDER — OXYCODONE HCL 5 MG PO TABS
5.0000 mg | ORAL_TABLET | ORAL | Status: DC | PRN
  Administered 2017-02-21: 10 mg via ORAL
  Filled 2017-02-20: qty 2

## 2017-02-20 MED ORDER — LEVOTHYROXINE SODIUM 88 MCG PO TABS: 88.0000 ug | ORAL_TABLET | Freq: Every day | ORAL | Status: DC

## 2017-02-20 MED ORDER — CHLORHEXIDINE GLUCONATE 0.12% ORAL RINSE (MEDLINE KIT)
15.0000 mL | Freq: Two times a day (BID) | OROMUCOSAL | Status: DC
  Administered 2017-02-21 – 2017-02-28 (×10): 15 mL via OROMUCOSAL

## 2017-02-20 MED ORDER — DOCUSATE SODIUM 100 MG PO CAPS
200.0000 mg | ORAL_CAPSULE | Freq: Every day | ORAL | Status: DC
  Administered 2017-02-21 – 2017-02-25 (×5): 200 mg via ORAL
  Filled 2017-02-20 (×5): qty 2

## 2017-02-20 MED ORDER — HEPARIN SODIUM (PORCINE) 1000 UNIT/ML IJ SOLN
INTRAMUSCULAR | Status: AC
  Filled 2017-02-20: qty 1

## 2017-02-20 MED ORDER — MIDAZOLAM HCL 2 MG/2ML IJ SOLN
INTRAMUSCULAR | Status: AC
  Filled 2017-02-20: qty 2

## 2017-02-20 MED ORDER — POTASSIUM CHLORIDE 10 MEQ/50ML IV SOLN: 10.0000 meq | INTRAVENOUS | Status: AC

## 2017-02-20 MED ORDER — HEPARIN SODIUM (PORCINE) 1000 UNIT/ML IJ SOLN
INTRAMUSCULAR | Status: DC | PRN
  Administered 2017-02-20: 29000 [IU] via INTRAVENOUS

## 2017-02-20 MED ORDER — VANCOMYCIN HCL 10 G IV SOLR
1250.0000 mg | INTRAVENOUS | Status: DC
  Filled 2017-02-20: qty 1250

## 2017-02-20 MED ORDER — ARTIFICIAL TEARS OPHTHALMIC OINT
TOPICAL_OINTMENT | OPHTHALMIC | Status: AC
  Filled 2017-02-20: qty 3.5

## 2017-02-20 MED ORDER — FENOFIBRATE 54 MG PO TABS
54.0000 mg | ORAL_TABLET | Freq: Every day | ORAL | Status: DC
  Filled 2017-02-20: qty 1

## 2017-02-20 MED ORDER — SODIUM CHLORIDE 0.9% FLUSH
3.0000 mL | Freq: Two times a day (BID) | INTRAVENOUS | Status: DC
  Administered 2017-02-22 – 2017-02-25 (×7): 3 mL via INTRAVENOUS

## 2017-02-20 MED ORDER — METOPROLOL TARTRATE 12.5 MG HALF TABLET
12.5000 mg | ORAL_TABLET | Freq: Two times a day (BID) | ORAL | Status: DC
  Administered 2017-02-21 – 2017-02-25 (×6): 12.5 mg via ORAL
  Filled 2017-02-20 (×6): qty 1

## 2017-02-20 MED ORDER — SODIUM CHLORIDE 0.9% FLUSH: 3.0000 mL | INTRAVENOUS | Status: DC | PRN

## 2017-02-20 MED ORDER — NITROGLYCERIN IN D5W 200-5 MCG/ML-% IV SOLN: 0.0000 ug/min | INTRAVENOUS | Status: DC

## 2017-02-20 MED ORDER — TRANEXAMIC ACID 1000 MG/10ML IV SOLN
1.5000 mg/kg/h | INTRAVENOUS | Status: DC
  Filled 2017-02-20: qty 25

## 2017-02-20 MED ORDER — ASPIRIN EC 81 MG PO TBEC: 81.0000 mg | DELAYED_RELEASE_TABLET | Freq: Every day | ORAL | Status: DC

## 2017-02-20 MED ORDER — HEMOSTATIC AGENTS (NO CHARGE) OPTIME
TOPICAL | Status: DC | PRN
  Administered 2017-02-20: 1 via TOPICAL

## 2017-02-20 MED ORDER — LACTATED RINGERS IV SOLN: 500.0000 mL | Freq: Once | INTRAVENOUS | Status: DC | PRN

## 2017-02-20 MED ORDER — ASPIRIN EC 325 MG PO TBEC
325.0000 mg | DELAYED_RELEASE_TABLET | Freq: Every day | ORAL | Status: DC
  Administered 2017-02-21 – 2017-02-22 (×2): 325 mg via ORAL
  Filled 2017-02-20 (×2): qty 1

## 2017-02-20 MED ORDER — DEXMEDETOMIDINE HCL IN NACL 400 MCG/100ML IV SOLN: 0.1000 ug/kg/h | INTRAVENOUS | Status: DC

## 2017-02-20 MED ORDER — SODIUM CHLORIDE 0.9 % IJ SOLN
OROMUCOSAL | Status: DC | PRN
  Administered 2017-02-20 (×3): via TOPICAL

## 2017-02-20 MED ORDER — PREDNISONE 20 MG PO TABS
40.0000 mg | ORAL_TABLET | Freq: Every day | ORAL | Status: DC
  Administered 2017-02-20: 40 mg via ORAL
  Filled 2017-02-20: qty 2

## 2017-02-20 MED ORDER — METOPROLOL TARTRATE 12.5 MG HALF TABLET: 12.5000 mg | ORAL_TABLET | Freq: Once | ORAL | Status: DC

## 2017-02-20 MED ORDER — SODIUM CHLORIDE 0.9 % IV SOLN
INTRAVENOUS | Status: DC
  Filled 2017-02-20: qty 30

## 2017-02-20 MED ORDER — PHENYLEPHRINE HCL 10 MG/ML IJ SOLN
INTRAVENOUS | Status: DC | PRN
  Administered 2017-02-20: 15 ug/min via INTRAVENOUS

## 2017-02-20 MED ORDER — 0.9 % SODIUM CHLORIDE (POUR BTL) OPTIME
TOPICAL | Status: DC | PRN
  Administered 2017-02-20: 1000 mL

## 2017-02-20 MED ORDER — FUROSEMIDE 10 MG/ML IJ SOLN
INTRAMUSCULAR | Status: AC
  Filled 2017-02-20: qty 4

## 2017-02-20 MED ORDER — MIDAZOLAM HCL 5 MG/5ML IJ SOLN
INTRAMUSCULAR | Status: DC | PRN
  Administered 2017-02-20: 1 mg via INTRAVENOUS
  Administered 2017-02-20: 2 mg via INTRAVENOUS
  Administered 2017-02-20: 1 mg via INTRAVENOUS
  Administered 2017-02-20: 3 mg via INTRAVENOUS
  Administered 2017-02-20 (×2): 1 mg via INTRAVENOUS

## 2017-02-20 MED ORDER — ACETAMINOPHEN 650 MG RE SUPP
650.0000 mg | Freq: Once | RECTAL | Status: AC
  Administered 2017-02-20: 650 mg via RECTAL

## 2017-02-20 MED ORDER — LIDOCAINE 2% (20 MG/ML) 5 ML SYRINGE
INTRAMUSCULAR | Status: AC
  Filled 2017-02-20: qty 5

## 2017-02-20 MED ORDER — MIDAZOLAM HCL 2 MG/2ML IJ SOLN
INTRAMUSCULAR | Status: DC | PRN
  Administered 2017-02-20: 1 mg via INTRAVENOUS

## 2017-02-20 MED ORDER — FAMOTIDINE IN NACL 20-0.9 MG/50ML-% IV SOLN
20.0000 mg | Freq: Two times a day (BID) | INTRAVENOUS | Status: AC
  Administered 2017-02-20 – 2017-02-21 (×2): 20 mg via INTRAVENOUS
  Filled 2017-02-20: qty 50

## 2017-02-20 MED ORDER — DOPAMINE-DEXTROSE 3.2-5 MG/ML-% IV SOLN: 0.0000 ug/kg/min | INTRAVENOUS | Status: DC

## 2017-02-20 MED ORDER — SODIUM CHLORIDE 0.45 % IV SOLN
INTRAVENOUS | Status: DC | PRN
  Administered 2017-02-20: 20:00:00 via INTRAVENOUS

## 2017-02-20 MED ORDER — LISINOPRIL 20 MG PO TABS: 40.0000 mg | ORAL_TABLET | Freq: Every day | ORAL | Status: DC

## 2017-02-20 MED ORDER — TRAMADOL HCL 50 MG PO TABS
50.0000 mg | ORAL_TABLET | ORAL | Status: DC | PRN
  Administered 2017-02-21: 50 mg via ORAL
  Administered 2017-02-21 – 2017-02-22 (×2): 100 mg via ORAL
  Administered 2017-02-22 – 2017-02-23 (×4): 50 mg via ORAL
  Filled 2017-02-20: qty 2
  Filled 2017-02-20 (×3): qty 1
  Filled 2017-02-20: qty 2
  Filled 2017-02-20 (×2): qty 1

## 2017-02-20 MED ORDER — VANCOMYCIN HCL 10 G IV SOLR
1250.0000 mg | INTRAVENOUS | Status: DC
  Filled 2017-02-20 (×2): qty 1250

## 2017-02-20 MED ORDER — ACETAMINOPHEN 160 MG/5ML PO SOLN: 650.0000 mg | Freq: Once | ORAL | Status: AC

## 2017-02-20 MED ORDER — SODIUM CHLORIDE 0.9 % IV SOLN
INTRAVENOUS | Status: DC | PRN
  Administered 2017-02-20: 1.7 [IU]/h via INTRAVENOUS

## 2017-02-20 MED ORDER — LEVOTHYROXINE SODIUM 88 MCG PO TABS
88.0000 ug | ORAL_TABLET | Freq: Every day | ORAL | Status: DC
  Administered 2017-02-21 – 2017-02-28 (×8): 88 ug via ORAL
  Filled 2017-02-20 (×8): qty 1

## 2017-02-20 MED ORDER — KETOROLAC TROMETHAMINE 15 MG/ML IJ SOLN
15.0000 mg | Freq: Four times a day (QID) | INTRAMUSCULAR | Status: DC | PRN
  Administered 2017-02-20: 15 mg via INTRAVENOUS
  Filled 2017-02-20: qty 1

## 2017-02-20 MED ORDER — THROMBIN 20000 UNITS EX SOLR
CUTANEOUS | Status: DC | PRN
  Administered 2017-02-20: 20000 [IU] via TOPICAL

## 2017-02-20 MED ORDER — ACETAMINOPHEN 500 MG PO TABS
1000.0000 mg | ORAL_TABLET | Freq: Four times a day (QID) | ORAL | Status: DC
  Administered 2017-02-21 – 2017-02-25 (×17): 1000 mg via ORAL
  Filled 2017-02-20 (×17): qty 2

## 2017-02-20 MED ORDER — ACETAMINOPHEN 160 MG/5ML PO SOLN
1000.0000 mg | Freq: Four times a day (QID) | ORAL | Status: DC
  Administered 2017-02-21: 1000 mg
  Filled 2017-02-20: qty 40.6

## 2017-02-20 MED ORDER — SODIUM CHLORIDE 0.9 % IV SOLN
INTRAVENOUS | Status: DC
  Administered 2017-02-20: 04:00:00 via INTRAVENOUS

## 2017-02-20 MED ORDER — SODIUM CHLORIDE 0.9% FLUSH
10.0000 mL | Freq: Two times a day (BID) | INTRAVENOUS | Status: DC
  Administered 2017-02-20 – 2017-02-23 (×5): 10 mL

## 2017-02-20 MED ORDER — PLASMA-LYTE 148 IV SOLN
INTRAVENOUS | Status: DC
  Filled 2017-02-20: qty 2.5

## 2017-02-20 MED ORDER — BISACODYL 10 MG RE SUPP: 10.0000 mg | Freq: Every day | RECTAL | Status: DC

## 2017-02-20 MED ORDER — NITROGLYCERIN IN D5W 200-5 MCG/ML-% IV SOLN
0.0000 ug/min | INTRAVENOUS | Status: DC
Start: 2017-02-20 — End: 2017-02-24

## 2017-02-20 MED ORDER — MAGNESIUM SULFATE 50 % IJ SOLN
40.0000 meq | INTRAMUSCULAR | Status: DC
  Filled 2017-02-20 (×2): qty 10

## 2017-02-20 MED ORDER — LEVALBUTEROL HCL 0.63 MG/3ML IN NEBU
0.6300 mg | INHALATION_SOLUTION | Freq: Three times a day (TID) | RESPIRATORY_TRACT | Status: DC
  Administered 2017-02-20 – 2017-02-28 (×21): 0.63 mg via RESPIRATORY_TRACT
  Filled 2017-02-20 (×22): qty 3

## 2017-02-20 MED ORDER — TRANEXAMIC ACID 1000 MG/10ML IV SOLN
INTRAVENOUS | Status: DC | PRN
  Administered 2017-02-20: 1.5 mg/kg/h via INTRAVENOUS

## 2017-02-20 MED ORDER — ONDANSETRON HCL 4 MG/2ML IJ SOLN: 4.0000 mg | Freq: Four times a day (QID) | INTRAMUSCULAR | Status: DC | PRN

## 2017-02-20 MED ORDER — ESCITALOPRAM OXALATE 10 MG PO TABS
10.0000 mg | ORAL_TABLET | Freq: Every day | ORAL | Status: DC
  Filled 2017-02-20: qty 1

## 2017-02-20 MED ORDER — BUPROPION HCL ER (XL) 150 MG PO TB24
150.0000 mg | ORAL_TABLET | Freq: Every day | ORAL | Status: DC
Start: 2017-02-20 — End: 2017-02-20
  Filled 2017-02-20: qty 1

## 2017-02-20 MED ORDER — MAGNESIUM SULFATE 50 % IJ SOLN: 40.0000 meq | INTRAMUSCULAR | Status: DC

## 2017-02-20 MED ORDER — MILRINONE LACTATE IN DEXTROSE 20-5 MG/100ML-% IV SOLN
0.1250 ug/kg/min | Freq: Once | INTRAVENOUS | Status: AC
  Administered 2017-02-20: .3 ug/kg/min via INTRAVENOUS
  Filled 2017-02-20: qty 100

## 2017-02-20 MED ORDER — TRANEXAMIC ACID (OHS) PUMP PRIME SOLUTION
2.0000 mg/kg | INTRAVENOUS | Status: DC
  Filled 2017-02-20: qty 1.64

## 2017-02-20 MED ORDER — FENTANYL CITRATE (PF) 100 MCG/2ML IJ SOLN
INTRAMUSCULAR | Status: DC | PRN
Start: 2017-02-20 — End: 2017-02-20
  Administered 2017-02-20 (×2): 25 ug via INTRAVENOUS

## 2017-02-20 MED ORDER — PHENYLEPHRINE HCL 10 MG/ML IJ SOLN
30.0000 ug/min | INTRAMUSCULAR | Status: DC
  Filled 2017-02-20: qty 2

## 2017-02-20 MED ORDER — LACTATED RINGERS IV SOLN
INTRAVENOUS | Status: DC
  Administered 2017-02-20: 19:00:00 via INTRAVENOUS
  Administered 2017-02-21: 20 mL/h via INTRAVENOUS

## 2017-02-20 MED ORDER — DEXMEDETOMIDINE HCL IN NACL 400 MCG/100ML IV SOLN: 0.0000 ug/kg/h | INTRAVENOUS | Status: DC

## 2017-02-20 MED ORDER — TRANEXAMIC ACID (OHS) PUMP PRIME SOLUTION: 2.0000 mg/kg | INTRAVENOUS | Status: DC

## 2017-02-20 MED ORDER — DEXMEDETOMIDINE HCL IN NACL 400 MCG/100ML IV SOLN
0.1000 ug/kg/h | INTRAVENOUS | Status: DC
  Filled 2017-02-20: qty 100

## 2017-02-20 MED ORDER — METOPROLOL TARTRATE 5 MG/5ML IV SOLN: 2.5000 mg | INTRAVENOUS | Status: DC | PRN

## 2017-02-20 MED ORDER — LEVOFLOXACIN IN D5W 500 MG/100ML IV SOLN
500.0000 mg | INTRAVENOUS | Status: AC
  Administered 2017-02-20: 500 mg via INTRAVENOUS
  Filled 2017-02-20: qty 100

## 2017-02-20 MED ORDER — DEXMEDETOMIDINE HCL IN NACL 400 MCG/100ML IV SOLN
INTRAVENOUS | Status: DC | PRN
  Administered 2017-02-20: .3 ug/kg/h via INTRAVENOUS

## 2017-02-20 MED ORDER — SODIUM CHLORIDE 0.9 % IV SOLN
INTRAVENOUS | Status: DC | PRN
  Administered 2017-02-20: 18:00:00 via INTRAVENOUS

## 2017-02-20 MED ORDER — FENTANYL CITRATE (PF) 100 MCG/2ML IJ SOLN
50.0000 ug | INTRAMUSCULAR | Status: DC | PRN
  Administered 2017-02-20: 100 ug via INTRAVENOUS
  Administered 2017-02-21 (×3): 50 ug via INTRAVENOUS
  Filled 2017-02-20 (×4): qty 2

## 2017-02-20 MED ORDER — CHLORHEXIDINE GLUCONATE 0.12 % MT SOLN: 15.0000 mL | Freq: Once | OROMUCOSAL | Status: DC

## 2017-02-20 MED ORDER — CHLORHEXIDINE GLUCONATE CLOTH 2 % EX PADS
6.0000 | MEDICATED_PAD | Freq: Every day | CUTANEOUS | Status: DC
  Administered 2017-02-20 – 2017-02-22 (×3): 6 via TOPICAL

## 2017-02-20 MED ORDER — VITAMIN D 1000 UNITS PO TABS: 2000.0000 [IU] | ORAL_TABLET | Freq: Every day | ORAL | Status: DC

## 2017-02-20 MED ORDER — VANCOMYCIN HCL IN DEXTROSE 1-5 GM/200ML-% IV SOLN
1000.0000 mg | Freq: Once | INTRAVENOUS | Status: AC
  Administered 2017-02-21: 1000 mg via INTRAVENOUS
  Filled 2017-02-20: qty 200

## 2017-02-20 MED ORDER — CELECOXIB 200 MG PO CAPS
200.0000 mg | ORAL_CAPSULE | Freq: Every day | ORAL | Status: DC
  Administered 2017-02-21: 200 mg via ORAL
  Filled 2017-02-20 (×2): qty 1

## 2017-02-20 MED ORDER — POTASSIUM CHLORIDE 2 MEQ/ML IV SOLN: 80.0000 meq | INTRAVENOUS | Status: DC

## 2017-02-20 MED ORDER — NITROGLYCERIN IN D5W 200-5 MCG/ML-% IV SOLN: 2.0000 ug/min | INTRAVENOUS | Status: DC

## 2017-02-20 MED ORDER — FENTANYL CITRATE (PF) 250 MCG/5ML IJ SOLN
INTRAMUSCULAR | Status: AC
Start: 2017-02-20 — End: 2017-02-20
  Filled 2017-02-20: qty 25

## 2017-02-20 MED ORDER — MIDAZOLAM HCL 10 MG/2ML IJ SOLN
INTRAMUSCULAR | Status: AC
  Filled 2017-02-20: qty 2

## 2017-02-20 MED ORDER — SODIUM CHLORIDE 0.9 % IV SOLN
INTRAVENOUS | Status: DC
  Administered 2017-02-20: 19:00:00 via INTRAVENOUS

## 2017-02-20 MED ORDER — PANTOPRAZOLE SODIUM 40 MG PO TBEC
40.0000 mg | DELAYED_RELEASE_TABLET | Freq: Every day | ORAL | Status: DC
  Administered 2017-02-22 – 2017-02-25 (×4): 40 mg via ORAL
  Filled 2017-02-20 (×4): qty 1

## 2017-02-20 SURGICAL SUPPLY — 11 items
CATH 5FR JR4 DIAGNOSTIC (CATHETERS) ×1 IMPLANT
CATH INFINITI 5FR ANG PIGTAIL (CATHETERS) ×1 IMPLANT
CATH INFINITI 5FR JL4 (CATHETERS) ×1 IMPLANT
DEVICE CLOSURE MYNXGRIP 6/7F (Vascular Products) ×1 IMPLANT
DEVICE INFLAT 30 PLUS (MISCELLANEOUS) IMPLANT
KIT MANI 3VAL PERCEP (MISCELLANEOUS) ×2 IMPLANT
NDL PERC 18GX7CM (NEEDLE) IMPLANT
NEEDLE PERC 18GX7CM (NEEDLE) ×2 IMPLANT
PACK CARDIAC CATH (CUSTOM PROCEDURE TRAY) ×2 IMPLANT
SHEATH AVANTI 6FR X 11CM (SHEATH) ×1 IMPLANT
WIRE EMERALD 3MM-J .035X150CM (WIRE) ×1 IMPLANT

## 2017-02-20 SURGICAL SUPPLY — 78 items
ADH SKN CLS APL DERMABOND .7 (GAUZE/BANDAGES/DRESSINGS) ×2
BAG DECANTER FOR FLEXI CONT (MISCELLANEOUS) ×4 IMPLANT
BANDAGE ACE 4X5 VEL STRL LF (GAUZE/BANDAGES/DRESSINGS) ×4 IMPLANT
BANDAGE ACE 6X5 VEL STRL LF (GAUZE/BANDAGES/DRESSINGS) ×4 IMPLANT
BLADE STERNUM SYSTEM 6 (BLADE) ×4 IMPLANT
BLADE SURG 11 STRL SS (BLADE) ×2 IMPLANT
BNDG GAUZE ELAST 4 BULKY (GAUZE/BANDAGES/DRESSINGS) ×4 IMPLANT
CANISTER SUCT 3000ML PPV (MISCELLANEOUS) ×4 IMPLANT
CATH CPB KIT GERHARDT (MISCELLANEOUS) ×4 IMPLANT
CATH THORACIC 28FR (CATHETERS) ×4 IMPLANT
CRADLE DONUT ADULT HEAD (MISCELLANEOUS) ×4 IMPLANT
DERMABOND ADVANCED (GAUZE/BANDAGES/DRESSINGS) ×2
DERMABOND ADVANCED .7 DNX12 (GAUZE/BANDAGES/DRESSINGS) IMPLANT
DRAIN CHANNEL 28F RND 3/8 FF (WOUND CARE) ×4 IMPLANT
DRAIN JACKSON PRT FLT 10 (DRAIN) ×2 IMPLANT
DRAPE CARDIOVASCULAR INCISE (DRAPES) ×4
DRAPE SLUSH/WARMER DISC (DRAPES) ×4 IMPLANT
DRAPE SRG 135X102X78XABS (DRAPES) ×2 IMPLANT
DRSG AQUACEL AG ADV 3.5X14 (GAUZE/BANDAGES/DRESSINGS) ×4 IMPLANT
ELECT BLADE 4.0 EZ CLEAN MEGAD (MISCELLANEOUS) ×4
ELECT REM PT RETURN 9FT ADLT (ELECTROSURGICAL) ×8
ELECTRODE BLDE 4.0 EZ CLN MEGD (MISCELLANEOUS) ×2 IMPLANT
ELECTRODE REM PT RTRN 9FT ADLT (ELECTROSURGICAL) ×4 IMPLANT
EVACUATOR SILICONE 100CC (DRAIN) ×2 IMPLANT
FELT TEFLON 1X6 (MISCELLANEOUS) ×6 IMPLANT
GAUZE SPONGE 4X4 12PLY STRL (GAUZE/BANDAGES/DRESSINGS) ×8 IMPLANT
GAUZE SPONGE 4X4 12PLY STRL LF (GAUZE/BANDAGES/DRESSINGS) ×4 IMPLANT
GLOVE BIO SURGEON STRL SZ 6 (GLOVE) ×6 IMPLANT
GLOVE BIO SURGEON STRL SZ 6.5 (GLOVE) ×15 IMPLANT
GLOVE BIO SURGEONS STRL SZ 6.5 (GLOVE) ×9
GLOVE BIOGEL M STER SZ 6 (GLOVE) ×16 IMPLANT
GLOVE BIOGEL PI IND STRL 6 (GLOVE) IMPLANT
GLOVE BIOGEL PI IND STRL 6.5 (GLOVE) IMPLANT
GLOVE BIOGEL PI INDICATOR 6 (GLOVE) ×4
GLOVE BIOGEL PI INDICATOR 6.5 (GLOVE) ×4
GOWN STRL REUS W/ TWL LRG LVL3 (GOWN DISPOSABLE) ×8 IMPLANT
GOWN STRL REUS W/TWL LRG LVL3 (GOWN DISPOSABLE) ×40
HEMOSTAT POWDER SURGIFOAM 1G (HEMOSTASIS) ×12 IMPLANT
HEMOSTAT SURGICEL 2X14 (HEMOSTASIS) ×4 IMPLANT
KIT BASIN OR (CUSTOM PROCEDURE TRAY) ×4 IMPLANT
KIT CATH CPB BARTLE (MISCELLANEOUS) IMPLANT
KIT CATH SUCT 8FR (CATHETERS) ×4 IMPLANT
KIT ROOM TURNOVER OR (KITS) ×4 IMPLANT
KIT SUCTION CATH 14FR (SUCTIONS) ×8 IMPLANT
KIT VASOVIEW HEMOPRO VH 3000 (KITS) ×4 IMPLANT
LEAD PACING MYOCARDI (MISCELLANEOUS) ×4 IMPLANT
MARKER GRAFT CORONARY BYPASS (MISCELLANEOUS) ×12 IMPLANT
MASK FACE 3 LYR ANT FOG FR FLM (MASK) IMPLANT
MASK SURG FACE ANTI FOG ADLT (MASK) ×4
NS IRRIG 1000ML POUR BTL (IV SOLUTION) ×22 IMPLANT
PACK OPEN HEART (CUSTOM PROCEDURE TRAY) ×4 IMPLANT
PAD ARMBOARD 7.5X6 YLW CONV (MISCELLANEOUS) ×8 IMPLANT
PAD ELECT DEFIB RADIOL ZOLL (MISCELLANEOUS) ×4 IMPLANT
PENCIL BUTTON HOLSTER BLD 10FT (ELECTRODE) ×4 IMPLANT
SCRUB FOAM CHG 2% SURGICAL (MISCELLANEOUS) ×2 IMPLANT
SPONGE LAP 18X18 X RAY DECT (DISPOSABLE) ×12 IMPLANT
SUT BONE WAX W31G (SUTURE) ×4 IMPLANT
SUT ETHILON 3 0 FSL (SUTURE) ×4 IMPLANT
SUT MNCRL AB 4-0 PS2 18 (SUTURE) ×4 IMPLANT
SUT PROLENE 3 0 SH1 36 (SUTURE) ×6 IMPLANT
SUT PROLENE 4 0 TF (SUTURE) ×12 IMPLANT
SUT PROLENE 6 0 C 1 30 (SUTURE) ×4 IMPLANT
SUT PROLENE 6 0 CC (SUTURE) ×10 IMPLANT
SUT PROLENE 7 0 BV 1 (SUTURE) ×4 IMPLANT
SUT PROLENE 7 0 BV1 MDA (SUTURE) ×6 IMPLANT
SUT PROLENE 8 0 BV175 6 (SUTURE) ×10 IMPLANT
SUT STEEL 6MS V (SUTURE) ×4 IMPLANT
SUT STEEL SZ 6 DBL 3X14 BALL (SUTURE) ×4 IMPLANT
SUT VIC AB 1 CTX 18 (SUTURE) ×8 IMPLANT
SUT VIC AB 2-0 CT1 27 (SUTURE) ×8
SUT VIC AB 2-0 CT1 TAPERPNT 27 (SUTURE) IMPLANT
SYSTEM SAHARA CHEST DRAIN ATS (WOUND CARE) ×4 IMPLANT
TAPE CLOTH SURG 4X10 WHT LF (GAUZE/BANDAGES/DRESSINGS) ×4 IMPLANT
TAPE PAPER 2X10 WHT MICROPORE (GAUZE/BANDAGES/DRESSINGS) ×2 IMPLANT
TOWEL GREEN STERILE (TOWEL DISPOSABLE) ×10 IMPLANT
TUBING INSUFFLATION (TUBING) ×4 IMPLANT
UNDERPAD 30X30 (UNDERPADS AND DIAPERS) ×4 IMPLANT
WATER STERILE IRR 1000ML POUR (IV SOLUTION) ×8 IMPLANT

## 2017-02-20 NOTE — OR Nursing (Signed)
1st call to SICU charge [phone]

## 2017-02-20 NOTE — Discharge Summary (Signed)
   Physician Discharge Summary  Patient ID: Stacey [name] MRN: [id number] DOB/AGE: 1940/08/19 76 y.o.  Admit date: 02/19/2017 Discharge date: 02/20/2017  Primary Discharge DiagnosisNon-ST elevation myocardial infarction Secondary Discharge Diagnosis coronary artery disease  Significant Diagnostic Studies: yes  Consults: cardiology  Hospital Course: The patient was admitted to the ICU on 02/20/2017 following new-onset substernal chest pain. Initial ECG revealed poor R-wave progression without acute ischemic ST-T wave changes. The patient ruled in for non-ST elevation myocardial infarction, with troponins of 1.2, 4.34 and 6.38. The patient was treated with intravenous heparin and nitro drip with persistent 5 out of 10 chest discomfort. She underwent urgent cardiac catheterization which revealed complex two-vessel coronary artery disease, with sequential 90 and 95% stenosis proximal and mid LAD, involving takeoff of a moderate caliber diagonal branch, 80% stenosis proximal segment of moderate caliber ramus intermedius branch, and 99% stenosis proximal segment large caliber RCA. Left ventriculography revealed mildly ventricular function, with anteroapical hypokinesis. The patient will be transferred to [facility] for urgent coronary artery bypass graft surgery per Dr. Pia Mau.   Discharge Exam: Blood pressure 124/66, pulse 69, temperature 98.1 F (36.7 C), temperature source Oral, resp. rate 15, height 5\' 2"  (1.575 m), weight 82.2 kg (181 lb 3.5 oz), SpO2 (!) 88 %.   General appearance: alert Head: Normocephalic, without obvious abnormality, atraumatic Eyes: conjunctivae/corneas clear. PERRL, EOM's intact. Fundi benign. Ears: normal TM's and external ear canals both ears Nose: Nares normal. Septum midline. Mucosa normal. No drainage or sinus tenderness. Throat: lips, mucosa, and tongue normal; teeth and gums normal Neck: no adenopathy, no carotid bruit, no JVD, supple, symmetrical,  trachea midline and thyroid not enlarged, symmetric, no tenderness/mass/nodules Back: symmetric, no curvature. ROM normal. No CVA tenderness. Resp: clear to auscultation bilaterally Chest wall: no tenderness Cardio: regular rate and rhythm, S1, S2 normal, no murmur, click, rub or gallop GI: soft, non-tender; bowel sounds normal; no masses,  no organomegaly Extremities: extremities normal, atraumatic, no cyanosis or edema Pulses: 2+ and symmetric Skin: Skin color, texture, turgor normal. No rashes or lesions Lymph nodes: Cervical, supraclavicular, and axillary nodes normal. Labs:   Lab Results  Component Value Date   WBC 8.5 02/20/2017   HGB 12.0 02/20/2017   HCT 34.0 (L) 02/20/2017   MCV 85.0 02/20/2017   PLT 188 02/20/2017    Recent Labs Lab 02/20/17 0738  NA 135  K 4.1  CL 101  CO2 26  BUN 20  CREATININE 0.79  CALCIUM 9.0  GLUCOSE 146*      Radiology:  XBM:[id number] sinus rhythm  FOLLOW UP PLANS AND APPOINTMENTS  Transfer [facility] for urgent CABG   BRING ALL MEDICATIONS WITH YOU TO FOLLOW UP APPOINTMENTS  Time spent with patient to include physician time: 25 minutes Signed:  Isaias Cowman MD, PhD, [facility] 02/20/2017, 11:28 AM

## 2017-02-20 NOTE — Anesthesia Procedure Notes (Signed)
Central Venous Catheter Insertion Performed by: Catalina Gravel, anesthesiologist Start/End9/29/2018 12:55 PM, 02/20/2017 1:00 PM Patient location: Pre-op. Preanesthetic checklist: patient identified, IV checked, site marked, risks and benefits discussed, surgical consent, monitors and equipment checked, pre-op evaluation, timeout performed and anesthesia consent Hand hygiene performed  and maximum sterile barriers used  Total catheter length 90. PA cath was placed.Swan type:thermodilution PA Cath depth:45 Procedure performed using ultrasound guided technique. Ultrasound Notes:anatomy identified, needle tip was noted to be adjacent to the nerve/plexus identified, no ultrasound evidence of intravascular and/or intraneural injection and image(s) printed for medical record Attempts: 1 Patient tolerated the procedure well with no immediate complications.

## 2017-02-20 NOTE — Progress Notes (Signed)
Patient ID: Stacey [name], female   DOB: 1940-11-26, 76 y.o.   MRN: [id number] EVENING ROUNDS NOTE :     [address]       [place] [address]             [phone]                 Day of Surgery Procedure(s) (LRB): CORONARY ARTERY BYPASS GRAFTING (CABG) x , three using left internal mammary artery to left anterior descending coronary artery and right greater saphenous vein harvested endoscopically to distal right and diagonal coronary arteries. (N/A) TRANSESOPHAGEAL ECHOCARDIOGRAM (TEE) (N/A)  Total Length of Stay:  LOS: 0 days  There were no vitals taken for this visit.  .Intake/Output      09/29 0701 - 09/30 0700   I.V. 2600   Blood 480   IV Piggyback 300   Total Intake 3380   Urine 1095   Blood 900   Total Output 1995   Net +1385         . sodium chloride    . [START ON 02/21/2017] sodium chloride    . sodium chloride    . albumin human    . dexmedetomidine (PRECEDEX) IV infusion    . famotidine (PEPCID) IV    . insulin (NOVOLIN-R) infusion    . lactated ringers    . lactated ringers    . lactated ringers    . [START ON 02/21/2017] levofloxacin (LEVAQUIN) IV    . magnesium sulfate    . milrinone    . nitroGLYCERIN    . phenylephrine (NEO-SYNEPHRINE) Adult infusion    . potassium chloride    . [START ON 02/21/2017] vancomycin       Lab Results  Component Value Date   WBC 8.5 02/20/2017   HGB 8.8 (L) 02/20/2017   HCT 26.0 (L) 02/20/2017   PLT 163 02/20/2017   GLUCOSE 134 (H) 02/20/2017   CHOL 183 02/20/2017   TRIG 96 02/20/2017   HDL 58 02/20/2017   LDLCALC 106 (H) 02/20/2017   ALT 12 12/18/2016   AST 16 12/18/2016   NA 137 02/20/2017   K 4.2 02/20/2017   CL 99 (L) 02/20/2017   CREATININE 0.70 02/20/2017   BUN 17 02/20/2017   CO2 26 02/20/2017   TSH 1.270 12/18/2016   INR 1.06 02/19/2017   HGBA1C 5.4 12/18/2016   Early post op, waking up slightly ci good on low sose milrinone   Grace Isaac MD  Beeper  [phone] Office [phone] 02/20/2017 7:37 PM

## 2017-02-20 NOTE — Anesthesia Postprocedure Evaluation (Signed)
Anesthesia Post Note  Patient: Stacey [name]  Procedure(s) Performed: Procedure(s) (LRB): CORONARY ARTERY BYPASS GRAFTING (CABG) x , three using left internal mammary artery to left anterior descending coronary artery and right greater saphenous vein harvested endoscopically to distal right and diagonal coronary arteries. (N/A) TRANSESOPHAGEAL ECHOCARDIOGRAM (TEE) (N/A)     Patient location during evaluation: SICU Anesthesia Type: General Level of consciousness: sedated Pain management: pain level controlled Vital Signs Assessment: post-procedure vital signs reviewed and stable Respiratory status: patient remains intubated per anesthesia plan Cardiovascular status: stable Postop Assessment: no apparent nausea or vomiting Anesthetic complications: no    Last Vitals:  Vitals:   02/20/17 2045 02/20/17 2100  BP:  92/61  Pulse: 88 89  Resp: 12 12  Temp:  36.4 C  SpO2: 98% 97%    Last Pain:  Vitals:   02/20/17 2100  TempSrc: Axillary  PainSc:                  Catalina Gravel

## 2017-02-20 NOTE — Anesthesia Procedure Notes (Addendum)
Procedure Name: Intubation Date/Time: 02/20/2017 1:22 PM Performed by: Julieta Bellini Pre-anesthesia Checklist: Patient identified, Emergency Drugs available, Suction available and Patient being monitored Patient Re-evaluated:Patient Re-evaluated prior to induction Oxygen Delivery Method: Circle system utilized Preoxygenation: Pre-oxygenation with 100% oxygen Induction Type: IV induction Ventilation: Mask ventilation without difficulty Laryngoscope Size: Mac and 3 Grade View: Grade I Tube type: Subglottic suction tube Tube size: 7.5 mm Number of attempts: 1 Airway Equipment and Method: Stylet Placement Confirmation: ETT inserted through vocal cords under direct vision,  positive ETCO2 and breath sounds checked- equal and bilateral Secured at: 21 cm Tube secured with: Tape Dental Injury: Teeth and Oropharynx as per pre-operative assessment

## 2017-02-20 NOTE — Consult Note (Signed)
PULMONARY / CRITICAL CARE MEDICINE   Name: Stacey [name] MRN: [id number] DOB: 1940/06/01    ADMISSION DATE:  02/19/2017   CONSULTATION DATE:  02/19/17  REFERRING MD:  Dr. Claria Dice  REASON: NSTEMI  CHIEF COMPLAINT:  Chest pain  HISTORY OF PRESENT ILLNESS:   76 Y/O WF who presented to the ED with complaints of chest pain, and acute dyspnea. She states that symptoms started insidiously and gradually got worse. She describes 2 episodes of substernal chest pain, associated with nausea and shortness of breath, lasting approximately 10-15 minutes. After she ate dinner, she had another episode and hence she decided to come to the emergency room. At the emergency room. Her chest x-ray as well as her initial EKG when negative. However, her troponin was elevated at 1.2. She was ruled in for a non-ST elevation MI, admitted on a heparin and nitroglycerin drip infusion. She reports significant improvement in symptoms but indicates mild left chest pressure. She reports having similar symptoms in August of this year.   PAST MEDICAL HISTORY :  She  has a past medical history of Arthritis; Asthma; Cancer ([place]); Complication of anesthesia; Depression; Family history of adverse reaction to anesthesia; Fibromyalgia; Glaucoma; Headache; Hypertension; Motion sickness; PONV (postoperative nausea and vomiting); and Thyroid disease.  PAST SURGICAL HISTORY: She  has a past surgical history that includes Abdominal hysterectomy; Nasal sinus surgery; Right oophorectomy; Appendectomy; Bladder surgery; Cataract extraction w/ intraocular lens  implant, bilateral; Colonoscopy with propofol (N/A, 01/17/2016); and polypectomy (01/17/2016).  Allergies  Allergen Reactions  . Cephalexin Anaphylaxis  . Atorvastatin   . Morphine And Related Hives  . Novocain [Procaine]     Chest pain  . Shellfish Allergy Swelling    angioedema  . Statins Other (See Comments)    Muscle pain  . Tomato     (by testing) - also beans, wheat,  peas  . Penicillins Hives and Rash    No current facility-administered medications on file prior to encounter.    Current Outpatient Prescriptions on File Prior to Encounter  Medication Sig  . albuterol (VENTOLIN HFA) 108 (90 Base) MCG/ACT inhaler INHALE 2 PUFFS BY MOUTH EVERY 4 TO 6 HOURS AS NEEDED FOR SHORTNESS OF BREATH  . aspirin 81 MG tablet ASPIRIN, 81MG  (Oral Tablet Delayed Release)  1 Every Day for 0 days  Quantity: 0.00;  Refills: 0   Ordered :17-November-2010  [name] ;  Started 05-Oct-2006 Active Comments: DX: 401.9  . buPROPion (WELLBUTRIN XL) 150 MG 24 hr tablet Take 1 tablet (150 mg total) by mouth daily.  Marland Kitchen BYSTOLIC 20 MG TABS TAKE 1 TABLET BY MOUTH EVERY DAY  . Cholecalciferol (VITAMIN D) 2000 units tablet Take 2,000 Units by mouth daily.  . Cyanocobalamin (B-12) 500 MCG TABS Take by mouth.  . EPINEPHrine (EPIPEN 2-PAK) 0.3 mg/0.3 mL IJ SOAJ injection INJECT AS DIRECTED FOR SEVERE ALLERGIC REACTION  . escitalopram (LEXAPRO) 10 MG tablet Take 1 tablet (10 mg total) by mouth daily.  Marland Kitchen levothyroxine (SYNTHROID, LEVOTHROID) 88 MCG tablet Take 1 tablet (88 mcg total) by mouth daily.  Marland Kitchen lisinopril (PRINIVIL,ZESTRIL) 40 MG tablet TAKE 1 TABLET (40 MG TOTAL) BY MOUTH DAILY.  Marland Kitchen amLODipine (NORVASC) 10 MG tablet Take 1 tablet (10 mg total) by mouth daily. (Patient not taking: Reported on 02/19/2017)  . diazepam (VALIUM) 10 MG tablet Take 1 tablet (10 mg total) by mouth as needed. (Patient not taking: Reported on 02/19/2017)  . zolpidem (AMBIEN) 10 MG tablet Take 1 tablet (10 mg total)  by mouth at bedtime as needed. for sleep    FAMILY HISTORY:  Her indicated that her mother is deceased. She indicated that her father is deceased. She indicated that all of her three sisters are deceased. She indicated that both of her brothers are deceased.    SOCIAL HISTORY: She  reports that she quit smoking about 41 years ago. She has never used smokeless tobacco. She reports that she does not drink  alcohol or use drugs.  REVIEW OF SYSTEMS:   Constitutional: Negative for fever and chills.  HENT: Negative for congestion and rhinorrhea.  Eyes: Negative for redness and visual disturbance.  Respiratory: Positive for shortness of breath but negative for wheezing.  Cardiovascular: positive for chest pressure but negative for palpitations.  Gastrointestinal:positive  for nausea but negative for vomiting and abdominal pain and  Loose stools Genitourinary: Negative for dysuria and urgency.  Endocrine: Denies polyuria, polyphagia and heat intolerance Musculoskeletal: Negative for myalgias and arthralgias.  Skin: Negative for pallor and wound.  Neurological: Negative for dizziness and headaches   SUBJECTIVE:   VITAL SIGNS: BP 135/71   Pulse 68   Temp 98 F (36.7 C) (Oral)   Resp 17   Ht 5\' 2"  (1.575 m)   Wt 181 lb 3.5 oz (82.2 kg)   SpO2 96%   BMI 33.15 kg/m   HEMODYNAMICS:    VENTILATOR SETTINGS:    INTAKE / OUTPUT: No intake/output data recorded.  PHYSICAL EXAMINATION: General:  No acute distress Neuro:  Alert and oriented 4, no focal deficits HEENT:  PERRLA, no JVD, trachea midline Cardiovascular:  Apical pulse regular, S1, S2, no murmur, regurg or gallop Lungs:  Clear to auscultation bilaterally Abdomen:  Soft, nontender, nondistended, normal bowel sounds in all 4 quadrants Musculoskeletal:  No deformities, no swelling Skin:  Warm and dry  LABS:  BMET  Recent Labs Lab 02/19/17 2014  NA 132*  K 4.0  CL 94*  CO2 26  BUN 25*  CREATININE 0.98  GLUCOSE 268*    Electrolytes  Recent Labs Lab 02/19/17 2014  CALCIUM 9.8    CBC  Recent Labs Lab 02/19/17 2014  WBC 5.8  HGB 14.1  HCT 41.4  PLT 212    Coag's  Recent Labs Lab 02/19/17 2353  APTT >160*  INR 1.06    Sepsis Markers No results for input(s): LATICACIDVEN, PROCALCITON, O2SATVEN in the last 168 hours.  ABG No results for input(s): PHART, PCO2ART, PO2ART in the last 168  hours.  Liver Enzymes No results for input(s): AST, ALT, ALKPHOS, BILITOT, ALBUMIN in the last 168 hours.  Cardiac Enzymes  Recent Labs Lab 02/19/17 2014 02/19/17 2353 02/20/17 0224  TROPONINI 0.07* 1.20* 4.34*    Glucose  Recent Labs Lab 02/20/17 0314  GLUCAP 155*    Imaging Dg Chest 2 View  Result Date: 02/19/2017 CLINICAL DATA:  Chest pain EXAM: CHEST  2 VIEW COMPARISON:  04/20/2014 FINDINGS: Hyperinflation. Mild cardiomegaly with aortic atherosclerosis. Moderate diffuse interstitial opacity possibly due to edema. Patchy more focal opacities at the lung bases could reflect superimposed pneumonia. No pneumothorax. IMPRESSION: 1. Moderate diffuse coarse interstitial opacity, could reflect mild underlying pulmonary edema ; patchy more confluent opacities at the lung bases may reflect superimposed pneumonia 2. Cardiomegaly with mild central congestion Electronically Signed   By: Donavan Foil M.D.   On: 02/19/2017 20:37   Dg Cervical Spine 2 Or 3 Views  Result Date: 02/19/2017 CLINICAL DATA:  Fall with neck pain EXAM: CERVICAL SPINE - 2-3 VIEW  COMPARISON:  Cervical spine radiograph 05/10/2014 FINDINGS: There is narrowing of the intervertebral disc spaces of the lower cervical spine that is unchanged from the prior study. There is no fracture. Alignment is normal. IMPRESSION: Unchanged lower cervical degenerative disc disease compared to 05/10/2014. No acute abnormality. Electronically Signed   By: Ulyses Jarred M.D.   On: 02/19/2017 16:02   Dg Thoracic Spine W/swimmers  Result Date: 02/19/2017 CLINICAL DATA:  Fall with back pain EXAM: THORACIC SPINE - 3 VIEWS COMPARISON:  None. FINDINGS: There is no evidence of thoracic spine fracture. Alignment is normal. No other significant bone abnormalities are identified. There is calcific aortic atherosclerosis. IMPRESSION: No fracture or listhesis of the thoracic spine. Electronically Signed   By: Ulyses Jarred M.D.   On: 02/19/2017 16:00    Ct Angio Chest Pe W And/or Wo Contrast  Result Date: 02/19/2017 CLINICAL DATA:  76 y/o F; Left-sided chest pain radiating into back and down left arm. EXAM: CT ANGIOGRAPHY CHEST WITH CONTRAST TECHNIQUE: Multidetector CT imaging of the chest was performed using the standard protocol during bolus administration of intravenous contrast. Multiplanar CT image reconstructions and MIPs were obtained to evaluate the vascular anatomy. CONTRAST:  75 cc Isovue 370 COMPARISON:  06/08/2008 CT chest FINDINGS: Cardiovascular: Mild cardiomegaly. Dense mitral annular calcification. Mild coronary artery calcification. No pericardial effusion. Normal caliber thoracic aorta with moderate calcific atherosclerosis. Enlarged main pulmonary artery measuring 4.0 cm, progressed from 2010. Satisfactory opacification of the main pulmonary artery. No pulmonary embolus identified. Mediastinum/Nodes: No enlarged mediastinal, hilar, or axillary lymph nodes. Thyroid gland, trachea, and esophagus demonstrate no significant findings. Lungs/Pleura: Smooth interlobular septal thickening and hazy ground-glass opacities greatest in lung bases is likely to represent interstitial and mild alveolar pulmonary edema. There is peribronchial thickening diffusely. No pleural effusion. Upper Abdomen: No acute abnormality. Musculoskeletal: No chest wall abnormality. No acute or significant osseous findings. Chronic right tenth posterior rib fracture. Review of the MIP images confirms the above findings. IMPRESSION: 1. No pulmonary embolus identified. 2. Mild cardiomegaly. Dense mitral annular calcification. Mild coronary artery calcification. 3. 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 4. Interstitial and mild alveolar pulmonary edema. Electronically Signed   By: Kristine Garbe M.D.   On: 02/19/2017 23:09    STUDIES:   none  CULTURES: none  ANTIBIOTICS: none  SIGNIFICANT EVENTS: 02/19/17>admitted  LINES/TUBES: PIVs  DISCUSSION: 76 y/o female presenting with NSTEMI  ASSESSMENT  NSTEMI Mitral regurgiation H/o hypertension, thyroid disease and depression  PLAN Cardiology following Heparin gtte Nitroglycerin gtt Moderate dynamic's per ICU protocol Cycle cardiac enzymes Monitor and replace electrolytes GI and DVT prophylaxis  FAMILY  - Updates: patient updated on current treatment plan  - Inter-disciplinary family meet or Palliative Care meeting due by:  day [place]. [facility] ANP-BC Pulmonary and Critical Care Medicine [facility] Pager [phone] or [phone] 02/20/2017, 5:13 AM   Merton Border, MD PCCM service Mobile [phone] Pager [phone] 02/20/2017 4:34 PM

## 2017-02-20 NOTE — Consult Note (Signed)
Stacey [name] Cardiology  CARDIOLOGY CONSULT NOTE  Patient ID: Stacey [name] MRN: [id number] DOB/AGE: [birth year] 76 y.o.  Admit date: 02/19/2017 Referring Physician Stacey [name] Primary Physician Stacey [name] Primary Cardiologist  Reason for Consultation Non-ST elevation myocardial infarction  HPI: Patient referred for non-ST elevation myocardial infarction. The patient has had a two-week history of intermittent shortness of breath. 2-D echocardiogram was performed 01/11/2001 which revealed normal left ventricular function, with LVEF greater than 55% with mild to moderate mitral regurgitation. On 02/19/2017 she experienced 2 episodes of substernal chest pressure with associated shortness of breath which lasted approximately 15-20 minutes. Last evening, she had recurrent chest pain following dinner which was prolonged and prompted her to come to [facility] emergency room. EKG reveals sinus rhythm, poor R-wave progression, no acute ST-T wave changes. Initial troponin was elevated to 1.2. Follow-up troponins were 4.34 and 6.38. The patient was treated with IV heparin and nitro drip, but has had persistent low level chest pain.  Review of systems complete and found to be negative unless listed above     Past Medical History:  Diagnosis Date  . Arthritis   . Asthma   . Cancer ([place])   . Complication of anesthesia   . Depression   . Family history of adverse reaction to anesthesia    most of family - PONV  . Fibromyalgia   . Glaucoma   . Headache    migraines - 1-2x/mo  . Hypertension   . Motion sickness    all moving vehicles  . PONV (postoperative nausea and vomiting)   . Thyroid disease     Past Surgical History:  Procedure Laterality Date  . ABDOMINAL HYSTERECTOMY    . APPENDECTOMY    . BLADDER SURGERY    . CATARACT EXTRACTION W/ INTRAOCULAR LENS  IMPLANT, BILATERAL    . COLONOSCOPY WITH PROPOFOL N/A 01/17/2016   Procedure: COLONOSCOPY WITH PROPOFOL;  Surgeon: Stacey Lame, MD;  Location: [place];  Service: Endoscopy;  Laterality: N/A;  . NASAL SINUS SURGERY    . POLYPECTOMY  01/17/2016   Procedure: POLYPECTOMY;  Surgeon: Stacey Lame, MD;  Location: [place];  Service: Endoscopy;;  . RIGHT OOPHORECTOMY      Prescriptions Prior to Admission  Medication Sig Dispense Refill Last Dose  . albuterol (VENTOLIN HFA) 108 (90 Base) MCG/ACT inhaler INHALE 2 PUFFS BY MOUTH EVERY 4 TO 6 HOURS AS NEEDED FOR SHORTNESS OF BREATH 18 g 5 prn  . aspirin 81 MG tablet ASPIRIN, 81MG  (Oral Tablet Delayed Release)  1 Every Day for 0 days  Quantity: 0.00;  Refills: 0   Ordered :17-November-2010  Stacey [name] ;  Started 05-Oct-2006 Active Comments: DX: 401.9   Taking  . buPROPion (WELLBUTRIN XL) 150 MG 24 hr tablet Take 1 tablet (150 mg total) by mouth daily. 30 tablet 12   . BYSTOLIC 20 MG TABS TAKE 1 TABLET BY MOUTH EVERY DAY 90 tablet 2 Taking  . celecoxib (CELEBREX) 200 MG capsule Take 1 capsule by mouth daily.  5   . Cholecalciferol (VITAMIN D) 2000 units tablet Take 2,000 Units by mouth daily.   Taking  . Cyanocobalamin (B-12) 500 MCG TABS Take by mouth.   Taking  . EPINEPHrine (EPIPEN 2-PAK) 0.3 mg/0.3 mL IJ SOAJ injection INJECT AS DIRECTED FOR SEVERE ALLERGIC REACTION 1 Device 1 prn  . escitalopram (LEXAPRO) 10 MG tablet Take 1 tablet (10 mg total) by mouth daily. 90 tablet 3   . fenofibrate (TRICOR) 145 MG tablet Take 1 tablet  by mouth daily.  1   . levothyroxine (SYNTHROID, LEVOTHROID) 88 MCG tablet Take 1 tablet (88 mcg total) by mouth daily. 90 tablet 3   . lisinopril (PRINIVIL,ZESTRIL) 40 MG tablet TAKE 1 TABLET (40 MG TOTAL) BY MOUTH DAILY. 90 tablet 3   . predniSONE (STERAPRED UNI-PAK 21 TAB) 10 MG (21) TBPK tablet Take 1 tablet by mouth as directed.     Marland Kitchen amLODipine (NORVASC) 10 MG tablet Take 1 tablet (10 mg total) by mouth daily. (Patient not taking: Reported on 02/19/2017) 90 tablet 0 Not Taking at Unknown time  . diazepam (VALIUM) 10 MG tablet Take 1 tablet (10 mg total) by mouth  as needed. (Patient not taking: Reported on 02/19/2017) 90 tablet 1 Not Taking at Unknown time  . FLUZONE HIGH-DOSE 0.5 ML injection Inject 1 Dose into the muscle once.  0 Completed Course at Unknown time  . zolpidem (AMBIEN) 10 MG tablet Take 1 tablet (10 mg total) by mouth at bedtime as needed. for sleep 90 tablet 1    Social History   Social History  . Marital status: Married    Spouse name: Stacey [name]  . Number of children: 3  . Years of education: N/A   Occupational History  .  [employer]   Social History Main Topics  . Smoking status: Former Smoker    Quit date: 05/26/1975  . Smokeless tobacco: Never Used     Comment: quit 45  years ago   . Alcohol use No  . Drug use: No  . Sexual activity: No   Other Topics Concern  . Not on file   Social History Narrative  . No narrative on file    Family History  Problem Relation Age of Onset  . Alzheimer's disease Mother   . Kidney cancer Mother   . Heart attack Father   . Pancreatic cancer Sister   . Heart attack Brother 86  . Ovarian cancer Sister   . Kidney disease Sister        dialysis  . Cancer Sister        uterin  . Diabetes Sister   . Heart attack Sister   . Heart attack Brother 56      Review of systems complete and found to be negative unless listed above      PHYSICAL EXAM  General: Well developed, well nourished, in no acute distress HEENT:  Normocephalic and atramatic Neck:  No JVD.  Lungs: Clear bilaterally to auscultation and percussion. Heart: HRRR . Normal S1 and S2 without gallops or murmurs.  Abdomen: Bowel sounds are positive, abdomen soft and non-tender  Msk:  Back normal, normal gait. Normal strength and tone for age. Extremities: No clubbing, cyanosis or edema.   Neuro: Alert and oriented X 3. Psych:  Good affect, responds appropriately  Labs:   Lab Results  Component Value Date   WBC 8.5 02/20/2017   HGB 12.0 02/20/2017   HCT 34.0 (L) 02/20/2017   MCV 85.0 02/20/2017   PLT 188  02/20/2017    Recent Labs Lab 02/20/17 0738  NA 135  K 4.1  CL 101  CO2 26  BUN 20  CREATININE 0.79  CALCIUM 9.0  GLUCOSE 146*   Lab Results  Component Value Date   TROPONINI 6.38 (HH) 02/20/2017    Lab Results  Component Value Date   CHOL 183 02/20/2017   CHOL 174 12/18/2016   CHOL 260 (H) 01/07/2016   Lab Results  Component Value Date  HDL 58 02/20/2017   HDL 54 12/18/2016   HDL 48 01/07/2016   Lab Results  Component Value Date   LDLCALC 106 (H) 02/20/2017   LDLCALC 104 (H) 12/18/2016   LDLCALC 169 (H) 01/07/2016   Lab Results  Component Value Date   TRIG 96 02/20/2017   TRIG 79 12/18/2016   TRIG 216 (H) 01/07/2016   Lab Results  Component Value Date   CHOLHDL 3.2 02/20/2017   CHOLHDL 3.2 12/18/2016   CHOLHDL 5.4 (H) 01/07/2016   No results found for: LDLDIRECT    Radiology: Dg Chest 2 View  Result Date: 02/19/2017 CLINICAL DATA:  Chest pain EXAM: CHEST  2 VIEW COMPARISON:  04/20/2014 FINDINGS: Hyperinflation. Mild cardiomegaly with aortic atherosclerosis. Moderate diffuse interstitial opacity possibly due to edema. Patchy more focal opacities at the lung bases could reflect superimposed pneumonia. No pneumothorax. IMPRESSION: 1. Moderate diffuse coarse interstitial opacity, could reflect mild underlying pulmonary edema ; patchy more confluent opacities at the lung bases may reflect superimposed pneumonia 2. Cardiomegaly with mild central congestion Electronically Signed   By: Donavan Foil M.D.   On: 02/19/2017 20:37   Dg Cervical Spine 2 Or 3 Views  Result Date: 02/19/2017 CLINICAL DATA:  Fall with neck pain EXAM: CERVICAL SPINE - 2-3 VIEW COMPARISON:  Cervical spine radiograph 05/10/2014 FINDINGS: There is narrowing of the intervertebral disc spaces of the lower cervical spine that is unchanged from the prior study. There is no fracture. Alignment is normal. IMPRESSION: Unchanged lower cervical degenerative disc disease compared to 05/10/2014. No acute  abnormality. Electronically Signed   By: Ulyses Jarred M.D.   On: 02/19/2017 16:02   Dg Thoracic Spine W/swimmers  Result Date: 02/19/2017 CLINICAL DATA:  Fall with back pain EXAM: THORACIC SPINE - 3 VIEWS COMPARISON:  None. FINDINGS: There is no evidence of thoracic spine fracture. Alignment is normal. No other significant bone abnormalities are identified. There is calcific aortic atherosclerosis. IMPRESSION: No fracture or listhesis of the thoracic spine. Electronically Signed   By: Ulyses Jarred M.D.   On: 02/19/2017 16:00   Ct Angio Chest Pe W And/or Wo Contrast  Result Date: 02/19/2017 CLINICAL DATA:  76 y/o F; Left-sided chest pain radiating into back and down left arm. EXAM: CT ANGIOGRAPHY CHEST WITH CONTRAST TECHNIQUE: Multidetector CT imaging of the chest was performed using the standard protocol during bolus administration of intravenous contrast. Multiplanar CT image reconstructions and MIPs were obtained to evaluate the vascular anatomy. CONTRAST:  75 cc Isovue 370 COMPARISON:  06/08/2008 CT chest FINDINGS: Cardiovascular: Mild cardiomegaly. Dense mitral annular calcification. Mild coronary artery calcification. No pericardial effusion. Normal caliber thoracic aorta with moderate calcific atherosclerosis. Enlarged main pulmonary artery measuring 4.0 cm, progressed from 2010. Satisfactory opacification of the main pulmonary artery. No pulmonary embolus identified. Mediastinum/Nodes: No enlarged mediastinal, hilar, or axillary lymph nodes. Thyroid gland, trachea, and esophagus demonstrate no significant findings. Lungs/Pleura: Smooth interlobular septal thickening and hazy ground-glass opacities greatest in lung bases is likely to represent interstitial and mild alveolar pulmonary edema. There is peribronchial thickening diffusely. No pleural effusion. Upper Abdomen: No acute abnormality. Musculoskeletal: No chest wall abnormality. No acute or significant osseous findings. Chronic right tenth  posterior rib fracture. Review of the MIP images confirms the above findings. IMPRESSION: 1. No pulmonary embolus identified. 2. Mild cardiomegaly. Dense mitral annular calcification. Mild coronary artery calcification. 3. 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 4. Interstitial and mild alveolar pulmonary  edema. Electronically Signed   By: Kristine Garbe M.D.   On: 02/19/2017 23:09    EKG: Normal sinus rhythm  ASSESSMENT AND PLAN:   1. Non-ST elevation myocardial infarction, persistent chest pain refractory to IV heparin and nitro drip  Recommendations  1. Proceed with cardiac catheterization with selective coronary arteriography. The risks, benefits alternatives cardiac catheterization, and possible percutaneous coronary intervention were explained to the patient and informed consent was obtained.  Signed: Isaias Cowman MD,PhD, [facility] 02/20/2017, 9:08 AM

## 2017-02-20 NOTE — Anesthesia Preprocedure Evaluation (Signed)
Anesthesia Evaluation  Patient identified by MRN, date of birth, ID band Patient awake    Reviewed: Allergy & Precautions, NPO status , Patient's Chart, lab work & pertinent test results  History of Anesthesia Complications (+) PONV, Family history of anesthesia reaction and history of anesthetic complications  Airway Mallampati: II  TM Distance: >3 FB Neck ROM: Full    Dental  (+) Teeth Intact, Dental Advisory Given, Loose   Pulmonary asthma , former smoker,    Pulmonary exam normal breath sounds clear to auscultation       Cardiovascular hypertension, Pt. on medications and Pt. on home beta blockers + CAD and + Past MI  Normal cardiovascular exam Rhythm:Regular Rate:Normal     Neuro/Psych  Headaches, PSYCHIATRIC DISORDERS Anxiety Depression    GI/Hepatic Neg liver ROS, GERD  Medicated,  Endo/Other  Hypothyroidism Obesity   Renal/GU negative Renal ROS     Musculoskeletal  (+) Arthritis , Fibromyalgia -  Abdominal   Peds  Hematology negative hematology ROS (+)   Anesthesia Other Findings Day of surgery medications reviewed with the patient.  Reproductive/Obstetrics                             Anesthesia Physical Anesthesia Plan  ASA: IV and emergent  Anesthesia Plan: General   Post-op Pain Management:    Induction: Intravenous  PONV Risk Score and Plan: 4 or greater and Ondansetron, Dexamethasone, Propofol infusion, Treatment may vary due to age or medical condition and Midazolam  Airway Management Planned: Oral ETT  Additional Equipment: Arterial line, CVP, PA Cath, TEE and Ultrasound Guidance Line Placement  Intra-op Plan:   Post-operative Plan: Post-operative intubation/ventilation  Informed Consent: I have reviewed the patients History and Physical, chart, labs and discussed the procedure including the risks, benefits and alternatives for the proposed anesthesia with the  patient or authorized representative who has indicated his/her understanding and acceptance.   Dental advisory given  Plan Discussed with: CRNA  Anesthesia Plan Comments:         Anesthesia Quick Evaluation

## 2017-02-20 NOTE — Brief Op Note (Addendum)
      [address]       [place] [address]             [phone]     02/20/2017  6:53 PM  PATIENT:  Stacey [name]  76 y.o. female  PRE-OPERATIVE DIAGNOSIS:  mycardial infarction  POST-OPERATIVE DIAGNOSIS:  mycardial infarction  PROCEDURE:  Procedure(s):  CORONARY ARTERY BYPASS GRAFTING x 3 -LIMA to LAD -SVG to DIAGONAL -SVG to DISTAL RCA  ENDOSCOPIC HARVEST GREATER SAPHENOUS VEIN -Right Leg  TRANSESOPHAGEAL ECHOCARDIOGRAM (TEE) (N/A)  SURGEON:  Surgeon(s) and Role:    * Grace Isaac, MD - Primary  PHYSICIAN ASSISTANT: Ellwood Handler PA-C  ANESTHESIA:   general  EBL:  Total I/O In: 1601 [I.V.:500; Blood:480; IV Piggyback:250] Out: 1995 [Urine:1095; Blood:900]  BLOOD ADMINISTERED:  CELLSAVER  DRAINS: Left Pleural Chest Tubes, Mediastinal Chest Drains, JP Drain Right leg   LOCAL MEDICATIONS USED:  NONE  SPECIMEN:  No Specimen  DISPOSITION OF SPECIMEN:  N/A  COUNTS:  YES  DICTATION: .Dragon Dictation  PLAN OF CARE: Admit to inpatient   PATIENT DISPOSITION:  ICU - intubated and hemodynamically stable.   Delay start of Pharmacological VTE agent (>24hrs) due to surgical blood loss or risk of bleeding: yes

## 2017-02-20 NOTE — Interval H&P Note (Signed)
History and Physical Interval Note:  02/20/2017 1:03 PM  Stacey [name]  has presented today for surgery, with the diagnosis of mycardia infarction  The various methods of treatment have been discussed with the patient and family. After consideration of risks, benefits and other options for treatment, the patient has consented to  Procedure(s): CORONARY ARTERY BYPASS GRAFTING (CABG) WITH TEE (N/A) as a surgical intervention .  The patient's history has been reviewed, patient examined, no change in status, stable for surgery.  I have reviewed the patient's chart and labs.  Questions were answered to the patient's satisfaction.     Grace Isaac

## 2017-02-20 NOTE — ED Notes (Signed)
Date and time results received: 02/20/17 0042 (use smartphrase ".now" to insert current time)  Test: troponin Critical Value: 1.20  Name of Provider Notified: Dr. Claria Dice  Orders Received? Or Actions Taken?: acknowledged

## 2017-02-20 NOTE — Progress Notes (Deleted)
  Echocardiogram 2D Echocardiogram has been performed.  Johny Chess 02/20/2017, 1:56 PM

## 2017-02-20 NOTE — ED Notes (Signed)
MD Beather Arbour aware of PTT results.

## 2017-02-20 NOTE — OR Nursing (Signed)
2nd call [phone]

## 2017-02-20 NOTE — Anesthesia Procedure Notes (Signed)
Central Venous Catheter Insertion Performed by: Catalina Gravel, anesthesiologist Start/End9/29/2018 12:45 PM, 02/20/2017 12:55 PM Patient location: Pre-op. Preanesthetic checklist: patient identified, IV checked, site marked, risks and benefits discussed, surgical consent, monitors and equipment checked, pre-op evaluation, timeout performed and anesthesia consent Position: Trendelenburg Lidocaine 1% used for infiltration and patient sedated Hand hygiene performed , maximum sterile barriers used  and Seldinger technique used Catheter size: 8.5 Fr Total catheter length 10. Central line was placed.Sheath introducer Swan type:thermodilution PA Cath depth:50 Procedure performed using ultrasound guided technique. Ultrasound Notes:anatomy identified, needle tip was noted to be adjacent to the nerve/plexus identified, no ultrasound evidence of intravascular and/or intraneural injection and image(s) printed for medical record Attempts: 1 Following insertion, line sutured, dressing applied and Biopatch. Post procedure assessment: blood return through all ports, free fluid flow and no air  Patient tolerated the procedure well with no immediate complications.

## 2017-02-20 NOTE — Progress Notes (Signed)
eLink Physician-Brief Progress Note Patient Name: Stacey [name] DOB: 07-29-1940 MRN: [id number]   Date of Service  02/20/2017  HPI/Events of Note  NSTEMI, On heparin drip, cardiology called Stable on cam check  eICU Interventions  No eICU interventions     Intervention Category Evaluation Type: New Patient Evaluation  Timesha Cervantez 02/20/2017, 4:17 AM

## 2017-02-20 NOTE — Progress Notes (Addendum)
ANTICOAGULATION CONSULT NOTE - Initial Consult  Pharmacy Consult for heparin (NOT A PHARMACY CONSULT) Indication: chest pain/ACS  Allergies  Allergen Reactions  . Cephalexin Anaphylaxis  . Atorvastatin   . Novocain [Procaine]     Chest pain  . Shellfish Allergy Swelling    angioedema  . Statins Other (See Comments)    Muscle pain  . Tomato     (by testing) - also beans, wheat, peas  . Penicillins Hives and Rash    Patient Measurements: Weight: 180 lb (81.6 kg) Heparin Dosing Weight: 81.6 kg  Vital Signs: Temp: 98.3 F (36.8 C) (09/28 2020) Temp Source: Oral (09/28 2020) BP: 171/83 (09/28 2020) Pulse Rate: 83 (09/28 2020)  Labs:  Recent Labs  02/19/17 2014  HGB 14.1  HCT 41.4  PLT 212  CREATININE 0.98  TROPONINI 0.07*    Estimated Creatinine Clearance: 49.9 mL/min (by C-G formula based on SCr of 0.98 mg/dL).   Medical History: Past Medical History:  Diagnosis Date  . Arthritis   . Asthma   . Cancer ([place])   . Complication of anesthesia   . Depression   . Family history of adverse reaction to anesthesia    most of family - PONV  . Fibromyalgia   . Glaucoma   . Headache    migraines - 1-2x/mo  . Hypertension   . Motion sickness    all moving vehicles  . PONV (postoperative nausea and vomiting)   . Thyroid disease     Medications:  Scheduled:    Assessment: Patient admitted for radiating CP with trops at 0.07. Patient is being started on a heparin drip.  No pharmacy consult ordered.  Goal of Therapy:  Heparin level 0.3-0.7 units/ml Monitor platelets by anticoagulation protocol: Yes   Plan:  Patient received heparin 4000 unit bolus IV x 1. Order came across for heparin drip 12 units/kg/hr, which came to 1000 units/hr Confirmed w/ ED doc doesn't want pharmacy consult--bolus and drip as written. Will monitor HL @ 0800. Baseline labs drawn.  Tobie Lords, PharmD, BCPS Clinical Pharmacist 02/20/2017

## 2017-02-20 NOTE — Progress Notes (Signed)
ANTICOAGULATION CONSULT NOTE - Initial Consult  Pharmacy Consult for heparin (NOW A PHARMACY CONSULT) Indication: chest pain/ACS  Allergies  Allergen Reactions  . Cephalexin Anaphylaxis  . Atorvastatin   . Novocain [Procaine]     Chest pain  . Shellfish Allergy Swelling    angioedema  . Statins Other (See Comments)    Muscle pain  . Tomato     (by testing) - also beans, wheat, peas  . Penicillins Hives and Rash    Patient Measurements: Weight: 180 lb (81.6 kg) Heparin Dosing Weight: 81.6 kg  Vital Signs: Temp: 98.3 F (36.8 C) (09/28 2020) Temp Source: Oral (09/28 2020) BP: 135/71 (09/29 0245) Pulse Rate: 68 (09/29 0245)  Labs:  Recent Labs  02/19/17 2014 02/19/17 2353 02/20/17 0224  HGB 14.1  --   --   HCT 41.4  --   --   PLT 212  --   --   APTT  --  >160*  --   LABPROT  --  13.7  --   INR  --  1.06  --   CREATININE 0.98  --   --   TROPONINI 0.07* 1.20* 4.34*    Estimated Creatinine Clearance: 49.9 mL/min (by C-G formula based on SCr of 0.98 mg/dL).   Medical History: Past Medical History:  Diagnosis Date  . Arthritis   . Asthma   . Cancer ([place])   . Complication of anesthesia   . Depression   . Family history of adverse reaction to anesthesia    most of family - PONV  . Fibromyalgia   . Glaucoma   . Headache    migraines - 1-2x/mo  . Hypertension   . Motion sickness    all moving vehicles  . PONV (postoperative nausea and vomiting)   . Thyroid disease     Medications:  Scheduled:  . aspirin  324 mg Oral NOW   Or  . aspirin  300 mg Rectal NOW  . [START ON 02/21/2017] aspirin EC  81 mg Oral Daily  . buPROPion  150 mg Oral Daily  . cholecalciferol  2,000 Units Oral Daily  . escitalopram  10 mg Oral Daily  . fenofibrate  54 mg Oral Daily  . levothyroxine  88 mcg Oral QAC breakfast  . lisinopril  40 mg Oral Daily  . nebivolol  20 mg Oral Daily    Assessment: Patient admitted for radiating CP with trops at 0.07. Patient is being  started on a heparin drip.  No pharmacy consult ordered in ED. Pharmacy consult now ordered by admitting MD.  Goal of Therapy:  Heparin level 0.3-0.7 units/ml Monitor platelets by anticoagulation protocol: Yes   Plan:  Patient received heparin 4000 unit bolus IV x 1. Order came across for heparin drip 12 units/kg/hr, which came to 1000 units/hr Confirmed w/ ED doc doesn't want pharmacy consult--bolus and drip as written. Pharmacy consult was now ordered by admitting MD--pharmacy will dose and monitor heparin. Current dose appropriate. Will reassess with next HL @ 0800. Baseline labs drawn.  Tobie Lords, PharmD, BCPS Clinical Pharmacist 02/20/2017

## 2017-02-20 NOTE — H&P (Signed)
[address]       [place] [address]             [phone]                    Kamdyn I Whittlesey [place] Medical Record #[id number] Date of Birth: [birth year]  Referring: Dr Saralyn Pilar  Primary Care: [name], MD  Chief Complaint:  Acute MI with onging chest pain    History of Present Illness:    Stacey [name] 76 y.o. female transferred emergently from [place] today for consideration of cabg . Cath done this am because  of rising troponin    On 02/19/2017 she experienced 2 episodes of substernal chest pressure with associated shortness of breath which lasted approximately 15-20 minutes. Last evening, she had recurrent chest pain following dinner last night which was prolonged and prompted her to go  to [facility] emergency room. EKG reveals sinus rhythm, poor R-wave progression, no acute ST-T wave changes. Initial troponin was elevated to 1.2. Follow-up troponins were 4.34 and 6.38. The patient was treated with IV heparin and nitro drip, but has had persistent low level chest pain. Urgent cath done today .  Current Activity/ Functional Status:  Patient is independent with mobility/ambulation, transfers, ADL's, IADL's.   Zubrod Score: At the time of surgery this patient's most appropriate activity status/level should be described as: []     0    Normal activity, no symptoms [x]     1    Restricted in physical strenuous activity but ambulatory, able to do out light work []     2    Ambulatory and capable of self care, unable to do work activities, up and about               >50 % of waking hours                              []     3    Only limited self care, in bed greater than 50% of waking hours []     4    Completely disabled, no self care, confined to bed or chair []     5    Moribund   Past Medical History:  Diagnosis Date  . Arthritis   . Asthma   . Cancer ([place])   . Complication of anesthesia   . Depression   . Family history of  adverse reaction to anesthesia    most of family - PONV  . Fibromyalgia   . Glaucoma   . Headache    migraines - 1-2x/mo  . Hypertension   . Motion sickness    all moving vehicles  . PONV (postoperative nausea and vomiting)   . Thyroid disease     Past Surgical History:  Procedure Laterality Date  . ABDOMINAL HYSTERECTOMY    . APPENDECTOMY    . BLADDER SURGERY    . CATARACT EXTRACTION W/ INTRAOCULAR LENS  IMPLANT, BILATERAL    . COLONOSCOPY WITH PROPOFOL N/A 01/17/2016   Procedure: COLONOSCOPY WITH PROPOFOL;  Surgeon: [name], MD;  Location: [place];  Service: Endoscopy;  Laterality: N/A;  . NASAL SINUS SURGERY    . POLYPECTOMY  01/17/2016   Procedure: POLYPECTOMY;  Surgeon: [name], MD;  Location: [place];  Service: Endoscopy;;  . RIGHT OOPHORECTOMY      Family History  Problem Relation  Age of Onset  . Alzheimer's disease Mother   . Kidney cancer Mother   . Heart attack Father   . Pancreatic cancer Sister   . Heart attack Brother 33  . Ovarian cancer Sister   . Kidney disease Sister        dialysis  . Cancer Sister        uterin  . Diabetes Sister   . Heart attack Sister   . Heart attack Brother 60    Social History   Social History  . Marital status: Married    Spouse name: [name]  . Number of children: 3  . Years of education: N/A   Occupational History  .  [employer]   Social History Main Topics  . Smoking status: Former Smoker    Quit date: 05/26/1975  . Smokeless tobacco: Never Used     Comment: quit 45  years ago   . Alcohol use No  . Drug use: No  . Sexual activity: No   Other Topics Concern  . Not on file   Social History Narrative  . No narrative on file    History  Smoking Status  . Former Smoker  . Quit date: 05/26/1975  Smokeless Tobacco  . Never Used    Comment: quit 45  years ago     History  Alcohol Use No     Allergies  Allergen Reactions  . Cephalexin Anaphylaxis  . Atorvastatin     . Morphine And Related Hives  . Novocain [Procaine]     Chest pain  . Shellfish Allergy Swelling    angioedema  . Statins Other (See Comments)    Muscle pain  . Tomato     (by testing) - also beans, wheat, peas  . Penicillins Hives and Rash    Current Facility-Administered Medications  Medication Dose Route Frequency Provider Last Rate Last Dose  . [START ON 02/21/2017] chlorhexidine (PERIDEX) 0.12 % solution 15 mL  15 mL Mouth/Throat Once Grace Isaac, MD      . heparin 2,500 Units, papaverine 30 mg in electrolyte-148 (PLASMALYTE-148) 500 mL irrigation   Irrigation To OR Grace Isaac, MD      . levofloxacin (LEVAQUIN) IVPB 500 mg  500 mg Intravenous To OR Grace Isaac, MD      . metoprolol tartrate (LOPRESSOR) tablet 12.5 mg  12.5 mg Oral Once Grace Isaac, MD      . vancomycin (VANCOCIN) 1,250 mg in sodium chloride 0.9 % 250 mL IVPB  1,250 mg Intravenous To OR Grace Isaac, MD       Facility-Administered Medications Ordered in Other Encounters  Medication Dose Route Frequency Provider Last Rate Last Dose  . fentaNYL (SUBLIMAZE) injection   Intravenous Anesthesia Intra-op Julieta Bellini, CRNA   50 mcg at 02/20/17 1244  . lactated ringers infusion   Intravenous Continuous PRN Julieta Bellini, CRNA      . midazolam (VERSED) 5 MG/5ML injection   Intravenous Anesthesia Intra-op Julieta Bellini, CRNA   1 mg at 02/20/17 1244  . nitroGLYCERIN 50 mg in dextrose 5 % 250 mL (0.2 mg/mL) infusion   Intravenous Continuous PRN Julieta Bellini, CRNA 15 mL/hr at 02/20/17 1243 50 mcg/min at 02/20/17 1243    Pertinent items are noted in HPI.   Review of Systems:     Cardiac Review of Systems: Y or N  Chest Pain [    ]  Resting SOB [   ] Exertional  SOB  [  ]  Orthopnea [  ]   Pedal Edema [   ]    Palpitations [  ] Syncope  [  ]   Presyncope [   ]  General Review of Systems: [Y] = yes [  ]=no Constitional: recent weight change [  ];  Wt  loss over the last 3 months [   ] anorexia [  ]; fatigue [  ]; nausea [  ]; night sweats [  ]; fever [  ]; or chills [  ];          Dental: poor dentition[  ]; Last Dentist visit:   Eye : blurred vision [  ]; diplopia [   ]; vision changes [  ];  Amaurosis fugax[  ]; Resp: cough [  ];  wheezing[  ];  hemoptysis[  ]; shortness of breath[  ]; paroxysmal nocturnal dyspnea[  ]; dyspnea on exertion[  ]; or orthopnea[  ];  GI:  gallstones[  ], vomiting[  ];  dysphagia[  ]; melena[  ];  hematochezia [  ]; heartburn[  ];   Hx of  Colonoscopy[  ]; GU: kidney stones [  ]; hematuria[  ];   dysuria [  ];  nocturia[  ];  history of     obstruction [  ]; urinary frequency [  ]             Skin: rash, swelling[  ];, hair loss[  ];  peripheral edema[  ];  or itching[  ]; Musculosketetal: myalgias[  ];  joint swelling[  ];  joint erythema[  ];  joint pain[  ];  back pain[  ];  Heme/Lymph: bruising[  ];  bleeding[  ];  anemia[  ];  Neuro: TIA[  ];  headaches[  ];  stroke[  ];  vertigo[  ];  seizures[  ];   paresthesias[  ];  difficulty walking[  ];  Psych:depression[  ]; anxiety[  ];  Endocrine: diabetes[  ];  thyroid dysfunction[  ];  Immunizations: Flu up to date [  ]; Pneumococcal up to date [  ];  Other:  Physical Exam: There were no vitals taken for this visit.  PHYSICAL EXAMINATION: General appearance: alert, cooperative and appears stated age Head: Normocephalic, without obvious abnormality, atraumatic Neck: no adenopathy, no carotid bruit, no JVD, supple, symmetrical, trachea midline and thyroid not enlarged, symmetric, no tenderness/mass/nodules Lymph nodes: Cervical, supraclavicular, and axillary nodes normal. Resp: clear to auscultation bilaterally Back: symmetric, no curvature. ROM normal. No CVA tenderness. Cardio: regular rate and rhythm, S1, S2 normal, no murmur, click, rub or gallop GI: soft, non-tender; bowel sounds normal; no masses,  no organomegaly Extremities: extremities normal,  atraumatic, no cyanosis or edema and Homans sign is negative, no sign of DVT Neurologic: Grossly normal  Diagnostic Studies & Laboratory data:     Recent Radiology Findings:   Dg Chest 2 View  Result Date: 02/19/2017 CLINICAL DATA:  Chest pain EXAM: CHEST  2 VIEW COMPARISON:  04/20/2014 FINDINGS: Hyperinflation. Mild cardiomegaly with aortic atherosclerosis. Moderate diffuse interstitial opacity possibly due to edema. Patchy more focal opacities at the lung bases could reflect superimposed pneumonia. No pneumothorax. IMPRESSION: 1. Moderate diffuse coarse interstitial opacity, could reflect mild underlying pulmonary edema ; patchy more confluent opacities at the lung bases may reflect superimposed pneumonia 2. Cardiomegaly with mild central congestion Electronically Signed   By: Donavan Foil M.D.   On: 02/19/2017 20:37   Dg Cervical Spine 2  Or 3 Views  Result Date: 02/19/2017 CLINICAL DATA:  Fall with neck pain EXAM: CERVICAL SPINE - 2-3 VIEW COMPARISON:  Cervical spine radiograph 05/10/2014 FINDINGS: There is narrowing of the intervertebral disc spaces of the lower cervical spine that is unchanged from the prior study. There is no fracture. Alignment is normal. IMPRESSION: Unchanged lower cervical degenerative disc disease compared to 05/10/2014. No acute abnormality. Electronically Signed   By: Ulyses Jarred M.D.   On: 02/19/2017 16:02   Dg Thoracic Spine W/swimmers  Result Date: 02/19/2017 CLINICAL DATA:  Fall with back pain EXAM: THORACIC SPINE - 3 VIEWS COMPARISON:  None. FINDINGS: There is no evidence of thoracic spine fracture. Alignment is normal. No other significant bone abnormalities are identified. There is calcific aortic atherosclerosis. IMPRESSION: No fracture or listhesis of the thoracic spine. Electronically Signed   By: Ulyses Jarred M.D.   On: 02/19/2017 16:00   Ct Angio Chest Pe W And/or Wo Contrast  Result Date: 02/19/2017 CLINICAL DATA:  76 y/o F; Left-sided chest pain  radiating into back and down left arm. EXAM: CT ANGIOGRAPHY CHEST WITH CONTRAST TECHNIQUE: Multidetector CT imaging of the chest was performed using the standard protocol during bolus administration of intravenous contrast. Multiplanar CT image reconstructions and MIPs were obtained to evaluate the vascular anatomy. CONTRAST:  75 cc Isovue 370 COMPARISON:  06/08/2008 CT chest FINDINGS: Cardiovascular: Mild cardiomegaly. Dense mitral annular calcification. Mild coronary artery calcification. No pericardial effusion. Normal caliber thoracic aorta with moderate calcific atherosclerosis. Enlarged main pulmonary artery measuring 4.0 cm, progressed from 2010. Satisfactory opacification of the main pulmonary artery. No pulmonary embolus identified. Mediastinum/Nodes: No enlarged mediastinal, hilar, or axillary lymph nodes. Thyroid gland, trachea, and esophagus demonstrate no significant findings. Lungs/Pleura: Smooth interlobular septal thickening and hazy ground-glass opacities greatest in lung bases is likely to represent interstitial and mild alveolar pulmonary edema. There is peribronchial thickening diffusely. No pleural effusion. Upper Abdomen: No acute abnormality. Musculoskeletal: No chest wall abnormality. No acute or significant osseous findings. Chronic right tenth posterior rib fracture. Review of the MIP images confirms the above findings. IMPRESSION: 1. No pulmonary embolus identified. 2. Mild cardiomegaly. Dense mitral annular calcification. Mild coronary artery calcification. 3. 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 4. Interstitial and mild alveolar pulmonary edema. Electronically Signed   By: Kristine Garbe M.D.   On: 02/19/2017 23:09     I have independently reviewed the above radiologic studies.  Recent Lab Findings: Lab Results  Component Value Date   WBC 8.5 02/20/2017   HGB 12.0 02/20/2017   HCT 34.0  (L) 02/20/2017   PLT 188 02/20/2017   GLUCOSE 146 (H) 02/20/2017   CHOL 183 02/20/2017   TRIG 96 02/20/2017   HDL 58 02/20/2017   LDLCALC 106 (H) 02/20/2017   ALT 12 12/18/2016   AST 16 12/18/2016   NA 135 02/20/2017   K 4.1 02/20/2017   CL 101 02/20/2017   CREATININE 0.79 02/20/2017   BUN 20 02/20/2017   CO2 26 02/20/2017   TSH 1.270 12/18/2016   INR 1.06 02/19/2017   HGBA1C 5.4 12/18/2016    Lab Results  Component Value Date   TROPONINI 6.38 [facility]) 02/20/2017         CARDIOLOGY DEPARTMENT NEYAH, ELLERMAN [id number] [facility] Acct #: [id number] [address] [place], [place] [address] Date: 01/11/2017 03:03 PM  Adult Female Age: 73 yrs  ECHOCARDIOGRAM REPORT Outpatient  KC::KCWC STUDY:CHEST  WALL TAPE: MD1:KOWALSKI, BRUCE JAY  ECHO:Yes DOPPLER:YesFILE: BP: 140/78 mmHg COLOR:YesCONTRAST:NoMACHINE:PhilipsHeight: 61 in RV BIOPSY:No 3D:No SOUND QLTY:Moderate Weight: 178 lb  MEDIUM:None BSA: 1.8 m2  ___________________________________________________________________________________________  HISTORY:DOE REASON:Assess, LV function INDICATION:SOBOE (shortness of breath on exertion) [R06.02 (ICD-10-CM)] ___________________________________________________________________________________________  ECHOCARDIOGRAPHIC MEASUREMENTS 2D DIMENSIONS AORTA ValuesNormal RangeMAIN PAValuesNormal Range Annulus:1.7 cm[2.1 - 2.5]PA Main:nm* [1.5 -  2.1] Aorta Sin:2.7 cm[2.7 - 3.3] RIGHT VENTRICLE ST Junction:nm* [2.3 - 2.9]RV Base:nm* [ < 4.2] Asc.Aorta:nm* [2.3 - 3.1] RV Mid:nm* [ < 3.5]  LEFT VENTRICLERV Length:2.7 cm[ < 8.6] LVIDd:4.8 cm[3.9 - 5.3] INFERIOR VENA CAVA LVIDs:3.2 cmMax. IVC:nm* [ <= 2.1]  FS:34.0 %[> 25]Min. IVC:nm* SWT:1.5 cm[0.5 - 0.9] ------------------ PWT:1.2 cm[0.5 - 0.9] nm* - not measured  LEFT ATRIUM LA Diam:4.3 cm[2.7 - 3.8] LA A4C Area:nm* [ < 20] LA Volume:nm* [22 - 52] ___________________________________________________________________________________________  ECHOCARDIOGRAPHIC DESCRIPTIONS  AORTIC ROOT Size:Normal Dissection:INDETERM FOR DISSECTION  AORTIC VALVE Leaflets:TricuspidMorphology:Normal Mobility:Fully mobile  LEFT VENTRICLE Size:Normal Anterior:Normal  Contraction:NormalLateral:Normal Closest EF:>55% (Estimated) Septal:Normal  LV Masses:No MassesApical:Normal  ZOX:WRUEAVWU LVH Inferior:Normal  Posterior:Normal Dias.FxClass:N/A  MITRAL VALVE Leaflets:Normal Mobility:Fully mobile Morphology:ANNULAR CALC  LEFT ATRIUM Size:MODERATELY ENLARGED LA Masses:No masses  IA Septum:Normal IAS  MAIN PA Size:Normal  PULMONIC VALVE Morphology:Normal Mobility:Fully mobile  RIGHT VENTRICLE  RV Masses:No MassesSize:Normal  Free  Wall:NormalContraction:Normal  TRICUSPID VALVE Leaflets:Normal Mobility:Fully mobile Morphology:Normal  RIGHT ATRIUM Size:Normal RA Other:None  RA Mass:No masses  PERICARDIUM  Fluid:No effusion  INFERIOR VENACAVA Size:Normal Normal respiratory collapse  _____________________________________________________________________  DOPPLER ECHO and OTHER SPECIAL PROCEDURES  Aortic:No ARNo AS 174.9 cm/sec peak vel12.2 mmHg peak grad   Mitral:MODERATE MRNo MS  2.5 cm^2 by DOPPLER MV Inflow E Vel=118.0 cm/sec MV Annulus E'Vel=4.2 cm/sec E/E'Ratio=28.1  Tricuspid:MILD TRNo TS 273.6 cm/sec peak TR vel 35.0 mmHg peak RV pressure  Pulmonary:MILD PRNo PS 89.6 cm/sec peak vel 3.2 mmHg peak grad    ___________________________________________________________________________________________  INTERPRETATION NORMAL LEFT VENTRICULAR SYSTOLIC FUNCTION WITH MODERATE LVH NORMAL RIGHT VENTRICULAR SYSTOLIC FUNCTION MODERATE VALVULAR REGURGITATION (See above) NO VALVULAR STENOSIS MILD to MODERATE MR MILD PHTN EF >55%  ___________________________________________________________________________________________  Electronically signed by: MD Serafina Royals on 01/11/2017 04:04 PM Performed By: Maurilio Lovely, [place] Ordering Physician: Serafina Royals _______________________________________________  Isaias Cowman, MD (Primary)  Paraschos, Alexander, MD  Procedures   LEFT HEART CATH AND CORONARY ANGIOGRAPHY  Conclusion     Ost Ramus to Ramus lesion, 80 %stenosed.  Ost LAD to Prox LAD lesion, 90 %stenosed.  Mid LAD lesion, 95 %stenosed.  Prox RCA to Mid RCA lesion, 99  %stenosed.   1. Complicated two-vessel coronary artery disease with 90% stenosis proximal LAD, 95% stenosis mid LAD involving takeoff of moderate caliber diagonal branch, 80% stenosis moderate caliber ramus intermedius branch, 99% stenosis proximal RCA. 2. Mildly reduced left ventricular function, with anteroapical and inferoapical hypokinesis  Recommendations  1. Urgent coronary artery bypass graft surgery   Procedural Details/Technique   Technical Details The patient was brought to the cardiac catheterization laboratory in the fasting state. 5 French sheath was introduced right femoral artery. Left ventriculography was performed with a pigtail catheter. Coronary angiography was performed with standard left and right diagnostic catheters. She received 1 mg of Versed and 50 g of fentanyl. Total contrast was 120 cc. the right femoral artery was closed with a minx closure device. There were no periprocedural complications.   Estimated blood loss <50 mL.  During this procedure the patient was administered the following to achieve and maintain moderate conscious sedation: Versed mg, while the patient's  heart rate, blood pressure, and oxygen saturation were continuously monitored.    Coronary Findings   Dominance: Right  Left Anterior Descending  Ost LAD to Prox LAD lesion, 90% stenosed. The lesion is tubular.  Mid LAD lesion, 95% stenosed. The lesion is tubular.  Ramus Intermedius  Ost Ramus to Ramus lesion, 80% stenosed. The lesion is tubular.  Right Coronary Artery  Prox RCA to Mid RCA lesion, 99% stenosed. The lesion is tubular.     Assessment / Plan:   Acute nonstemi with ongoing chest pain in  Setting of discrete   -large and complex disease of LAD disease. Agree with Dr Claudius Sis that emergency CABG offers best treatment option.       The goals risks and alternatives of the planned surgical procedure Procedure(s): CORONARY ARTERY BYPASS GRAFTING (CABG) WITH TEE (N/A)  have  been discussed with the patient in detail. The risks of the procedure including death, infection, stroke, myocardial infarction, bleeding, blood transfusion have all been discussed specifically.  I have quoted Stacey [name] a 2 % of perioperative mortality and a complication rate as high as 40  %. The patient's questions have been answered.Stacey [name] is willing  to proceed with the planned procedure.  Grace Isaac MD      [address] [place] [address] Office [phone]   Beeper [phone]  02/20/2017 12:56 PM

## 2017-02-20 NOTE — Anesthesia Procedure Notes (Signed)
Arterial Line Insertion Start/End9/29/2018 12:56 PM Performed by: Julieta Bellini, CRNA  Preanesthetic checklist: patient identified, IV checked, site marked, risks and benefits discussed, surgical consent, monitors and equipment checked, pre-op evaluation, timeout performed and anesthesia consent Lidocaine 1% used for infiltration radial was placed Catheter size: 20 G Hand hygiene performed  and maximum sterile barriers used  Allen's test indicative of satisfactory collateral circulation Attempts: 1 Procedure performed without using ultrasound guided technique. Following insertion, dressing applied and Biopatch. Post procedure assessment: normal  Patient tolerated the procedure well with no immediate complications.

## 2017-02-20 NOTE — Progress Notes (Signed)
[place] for heparin  Indication: chest pain/ACS  Allergies  Allergen Reactions  . Cephalexin Anaphylaxis  . Atorvastatin   . Morphine And Related Hives  . Novocain [Procaine]     Chest pain  . Shellfish Allergy Swelling    angioedema  . Statins Other (See Comments)    Muscle pain  . Tomato     (by testing) - also beans, wheat, peas  . Penicillins Hives and Rash    Patient Measurements: Height: 5\' 2"  (157.5 cm) Weight: 181 lb 3.5 oz (82.2 kg) IBW/kg (Calculated) : 50.1 Heparin Dosing Weight: 81.6 kg  Vital Signs: Temp: 98.1 F (36.7 C) (09/29 0800) Temp Source: Oral (09/29 0800) BP: 124/66 (09/29 0800) Pulse Rate: 69 (09/29 0800)  Labs:  Recent Labs  02/19/17 2014 02/19/17 2353 02/20/17 0224 02/20/17 0738  HGB 14.1  --   --  12.0  HCT 41.4  --   --  34.0*  PLT 212  --   --  188  APTT  --  >160*  --   --   LABPROT  --  13.7  --   --   INR  --  1.06  --   --   HEPARINUNFRC  --   --   --  0.38  CREATININE 0.98  --   --  0.79  TROPONINI 0.07* 1.20* 4.34* 6.38*    Estimated Creatinine Clearance: 61.3 mL/min (by C-G formula based on SCr of 0.79 mg/dL).   Medical History: Past Medical History:  Diagnosis Date  . Arthritis   . Asthma   . Cancer ([place])   . Complication of anesthesia   . Depression   . Family history of adverse reaction to anesthesia    most of family - PONV  . Fibromyalgia   . Glaucoma   . Headache    migraines - 1-2x/mo  . Hypertension   . Motion sickness    all moving vehicles  . PONV (postoperative nausea and vomiting)   . Thyroid disease     Medications:  Scheduled:  . aspirin  324 mg Oral NOW   Or  . aspirin  300 mg Rectal NOW  . [START ON 02/21/2017] aspirin EC  81 mg Oral Daily  . buPROPion  150 mg Oral Daily  . cholecalciferol  2,000 Units Oral Daily  . escitalopram  10 mg Oral Daily  . fenofibrate  54 mg Oral Daily  . levothyroxine  88 mcg Oral QAC breakfast  . lisinopril  40  mg Oral Daily  . nebivolol  20 mg Oral Daily  . predniSONE  40 mg Oral Q breakfast    Assessment: Pharmacy consulted for heparin drip management for 76 yo female admitted with elevated troponins/NSTEMI. Patient will have cardiac catheterization. Patient currently receiving heparin drip at 1000 units/hr.   Goal of Therapy:  Heparin level 0.3-0.7 units/ml Monitor platelets by anticoagulation protocol: Yes   Plan:  Anti-Xa level is in range, will continue heparin drip at 1000 units/hr and obtain follow up anti-Xa level at 1600.   Pharmacy will continue to monitor and adjust per consult.   MLS 02/20/2017

## 2017-02-20 NOTE — Transfer of Care (Signed)
Immediate Anesthesia Transfer of Care Note  Patient: Stacey [name]  Procedure(s) Performed: Procedure(s): CORONARY ARTERY BYPASS GRAFTING (CABG) x , three using left internal mammary artery to left anterior descending coronary artery and right greater saphenous vein harvested endoscopically to distal right and diagonal coronary arteries. (N/A) TRANSESOPHAGEAL ECHOCARDIOGRAM (TEE) (N/A)  Patient Location: SICU  Anesthesia Type:General  Level of Consciousness: sedated, unresponsive and Patient remains intubated per anesthesia plan  Airway & Oxygen Therapy: Patient remains intubated per anesthesia plan and Patient placed on Ventilator (see vital sign flow sheet for setting)  Post-op Assessment: Report given to RN and Post -op Vital signs reviewed and stable  Post vital signs: Reviewed and stable  Last Vitals: There were no vitals filed for this visit.  Last Pain: There were no vitals filed for this visit.       Complications: No apparent anesthesia complications

## 2017-02-20 NOTE — H&P (Addendum)
[place] [facility] [place] NAME: Stacey [name]    MR#:  [id number]  DATE OF BIRTH:  [birth year]  DATE OF ADMISSION:  02/19/2017  PRIMARY CARE PHYSICIAN: [name], MD   REQUESTING/REFERRING PHYSICIAN:   CHIEF COMPLAINT:   Chief Complaint  Patient presents with  . Chest Pain    HISTORY OF PRESENT ILLNESS: Stacey [name]  is a 76 y.o. female with a known history of arthritis, bronchial asthma,fibromyalgia, glaucoma, hypertension presented to the emergency room with chest pain.chest pain started yesterday and located in the retrosternal area. Patient also complains of back pain. Chest pain is sharp in nature 7 out of 10 on a scale of 1-10. Patient also had some numbness in the left arm.She also had some pain radiating down the left arm. Patient follows up with [facility] clinic cardiology as outpatient. Patient was evaluated in the emergency room her troponin was elevated. She was started on IV nitroglycerin drip and IV heparin drip. Hospitalist service was consulted. EKG showed normal sinus rhythm with no ST segment elevation.Patient also gets short of breath whenever she takes a flight of stairs. No history of orthopnea PAST MEDICAL HISTORY:   Past Medical History:  Diagnosis Date  . Arthritis   . Asthma   . Cancer ([place])   . Complication of anesthesia   . Depression   . Family history of adverse reaction to anesthesia    most of family - PONV  . Fibromyalgia   . Glaucoma   . Headache    migraines - 1-2x/mo  . Hypertension   . Motion sickness    all moving vehicles  . PONV (postoperative nausea and vomiting)   . Thyroid disease     PAST SURGICAL HISTORY: Past Surgical History:  Procedure Laterality Date  . ABDOMINAL HYSTERECTOMY    . APPENDECTOMY    . BLADDER SURGERY    . CATARACT EXTRACTION W/ INTRAOCULAR LENS  IMPLANT, BILATERAL    . COLONOSCOPY WITH PROPOFOL N/A 01/17/2016   Procedure: COLONOSCOPY WITH PROPOFOL;   Surgeon: [name], MD;  Location: [facility];  Service: Endoscopy;  Laterality: N/A;  . NASAL SINUS SURGERY    . POLYPECTOMY  01/17/2016   Procedure: POLYPECTOMY;  Surgeon: [name], MD;  Location: [facility];  Service: Endoscopy;;  . RIGHT OOPHORECTOMY      SOCIAL HISTORY:  Social History  Substance Use Topics  . Smoking status: Former Smoker    Quit date: 05/26/1975  . Smokeless tobacco: Never Used     Comment: quit 45  years ago   . Alcohol use No    FAMILY HISTORY:  Family History  Problem Relation Age of Onset  . Alzheimer's disease Mother   . Kidney cancer Mother   . Heart attack Father   . Pancreatic cancer Sister   . Heart attack Brother 67  . Ovarian cancer Sister   . Kidney disease Sister        dialysis  . Cancer Sister        uterin  . Diabetes Sister   . Heart attack Sister   . Heart attack Brother 16    DRUG ALLERGIES:  Allergies  Allergen Reactions  . Cephalexin Anaphylaxis  . Atorvastatin   . Novocain [Procaine]     Chest pain  . Shellfish Allergy Swelling    angioedema  . Statins Other (See Comments)    Muscle pain  . Tomato     (by  testing) - also beans, wheat, peas  . Penicillins Hives and Rash    REVIEW OF SYSTEMS:   CONSTITUTIONAL: No fever, fatigue or weakness.  EYES: No blurred or double vision.  EARS, NOSE, AND THROAT: No tinnitus or ear pain.  RESPIRATORY: No cough, has shortness of breath, no wheezing or hemoptysis.  CARDIOVASCULAR: Has chest pain,  No orthopnea, edema.  GASTROINTESTINAL: No nausea, vomiting, diarrhea or abdominal pain.  GENITOURINARY: No dysuria, hematuria.  ENDOCRINE: No polyuria, nocturia,  HEMATOLOGY: No anemia, easy bruising or bleeding SKIN: No rash or lesion. MUSCULOSKELETAL: No joint pain or arthritis.   Has back pain NEUROLOGIC: No tingling, numbness, weakness.  PSYCHIATRY: No anxiety or depression.   MEDICATIONS AT HOME:  Prior to Admission medications   Medication Sig  Start Date End Date Taking? Authorizing Provider  albuterol (VENTOLIN HFA) 108 (90 Base) MCG/ACT inhaler INHALE 2 PUFFS BY MOUTH EVERY 4 TO 6 HOURS AS NEEDED FOR SHORTNESS OF BREATH 12/17/16  Yes Mar Daring, PA-C  aspirin 81 MG tablet ASPIRIN, 81MG  (Oral Tablet Delayed Release)  1 Every Day for 0 days  Quantity: 0.00;  Refills: 0   Ordered :17-November-2010  [name] ;  Started 05-Oct-2006 Active Comments: DX: 401.9 10/05/06  Yes [provider]  buPROPion (WELLBUTRIN XL) 150 MG 24 hr tablet Take 1 tablet (150 mg total) by mouth daily. 02/18/17  Yes [name], MD  BYSTOLIC 20 MG TABS TAKE 1 TABLET BY MOUTH EVERY DAY 10/27/16  Yes Mar Daring, PA-C  celecoxib (CELEBREX) 200 MG capsule Take 1 capsule by mouth daily. 01/26/17  Yes [provider]  Cholecalciferol (VITAMIN D) 2000 units tablet Take 2,000 Units by mouth daily.   Yes [provider]  Cyanocobalamin (B-12) 500 MCG TABS Take by mouth.   Yes [provider]  EPINEPHrine (EPIPEN 2-PAK) 0.3 mg/0.3 mL IJ SOAJ injection INJECT AS DIRECTED FOR SEVERE ALLERGIC REACTION 12/17/16  Yes Fenton Malling M, PA-C  escitalopram (LEXAPRO) 10 MG tablet Take 1 tablet (10 mg total) by mouth daily. 12/17/16  Yes Mar Daring, PA-C  fenofibrate (TRICOR) 145 MG tablet Take 1 tablet by mouth daily. 01/26/17  Yes [provider]  levothyroxine (SYNTHROID, LEVOTHROID) 88 MCG tablet Take 1 tablet (88 mcg total) by mouth daily. 12/17/16  Yes Mar Daring, PA-C  lisinopril (PRINIVIL,ZESTRIL) 40 MG tablet TAKE 1 TABLET (40 MG TOTAL) BY MOUTH DAILY. 01/21/17  Yes Fenton Malling M, PA-C  predniSONE (STERAPRED UNI-PAK 21 TAB) 10 MG (21) TBPK tablet Take 1 tablet by mouth as directed. 02/18/17  Yes [provider]  amLODipine (NORVASC) 10 MG tablet Take 1 tablet (10 mg total) by mouth daily. Patient not taking: Reported on 02/19/2017 12/21/16   Mar Daring, PA-C  diazepam  (VALIUM) 10 MG tablet Take 1 tablet (10 mg total) by mouth as needed. Patient not taking: Reported on 02/19/2017 12/17/16   Mar Daring, PA-C  FLUZONE HIGH-DOSE 0.5 ML injection Inject 1 Dose into the muscle once. 02/17/17   [provider]  zolpidem (AMBIEN) 10 MG tablet Take 1 tablet (10 mg total) by mouth at bedtime as needed. for sleep 12/17/16   Mar Daring, PA-C      PHYSICAL EXAMINATION:   VITAL SIGNS: Blood pressure 125/64, pulse 69, temperature 98.3 F (36.8 C), temperature source Oral, resp. rate (!) 21, weight 81.6 kg (180 lb), SpO2 98 %.  GENERAL:  76 y.o.-year-old patient lying in the bed on oxygen via  nasal canula EYES: Pupils equal, round, reactive to light and accommodation. No scleral icterus. Extraocular muscles intact.  HEENT: Head atraumatic, normocephalic. Oropharynx and nasopharynx clear.  NECK:  Supple, no jugular venous distention. No thyroid enlargement, no tenderness.  LUNGS: Normal breath sounds bilaterally, no wheezing, rales,rhonchi or crepitation. No use of accessory muscles of respiration.  CARDIOVASCULAR: S1, S2 normal. No murmurs, rubs, or gallops.  ABDOMEN: Soft, nontender, nondistended. Bowel sounds present. No organomegaly or mass.  EXTREMITIES: No pedal edema, cyanosis, or clubbing.  NEUROLOGIC: Cranial nerves II through XII are intact. Muscle strength 5/5 in all extremities. Sensation intact. Gait not checked.  PSYCHIATRIC: The patient is alert and oriented x 3.  SKIN: No obvious rash, lesion, or ulcer.   LABORATORY PANEL:   CBC  Recent Labs Lab 02/19/17 2014  WBC 5.8  HGB 14.1  HCT 41.4  PLT 212  MCV 87.3  MCH 29.8  MCHC 34.1  RDW 13.4   ------------------------------------------------------------------------------------------------------------------  Chemistries   Recent Labs Lab 02/19/17 2014  NA 132*  K 4.0  CL 94*  CO2 26  GLUCOSE 268*  BUN 25*  CREATININE 0.98  CALCIUM 9.8    ------------------------------------------------------------------------------------------------------------------ estimated creatinine clearance is 49.9 mL/min (by C-G formula based on SCr of 0.98 mg/dL). ------------------------------------------------------------------------------------------------------------------ No results for input(s): TSH, T4TOTAL, T3FREE, THYROIDAB in the last 72 hours.  Invalid input(s): FREET3   Coagulation profile  Recent Labs Lab 02/19/17 2353  INR 1.06   ------------------------------------------------------------------------------------------------------------------- No results for input(s): DDIMER in the last 72 hours. -------------------------------------------------------------------------------------------------------------------  Cardiac Enzymes  Recent Labs Lab 02/19/17 2014 02/19/17 2353  TROPONINI 0.07* 1.20*   ------------------------------------------------------------------------------------------------------------------ Invalid input(s): POCBNP  ---------------------------------------------------------------------------------------------------------------  Urinalysis    Component Value Date/Time   BILIRUBINUR Negative 06/19/2015 1722   PROTEINUR Negative 06/19/2015 1722   UROBILINOGEN 0.2 06/19/2015 1722   NITRITE Negative 06/19/2015 1722   LEUKOCYTESUR Negative 06/19/2015 1722     RADIOLOGY: Dg Chest 2 View  Result Date: 02/19/2017 CLINICAL DATA:  Chest pain EXAM: CHEST  2 VIEW COMPARISON:  04/20/2014 FINDINGS: Hyperinflation. Mild cardiomegaly with aortic atherosclerosis. Moderate diffuse interstitial opacity possibly due to edema. Patchy more focal opacities at the lung bases could reflect superimposed pneumonia. No pneumothorax. IMPRESSION: 1. Moderate diffuse coarse interstitial opacity, could reflect mild underlying pulmonary edema ; patchy more confluent opacities at the lung bases may reflect superimposed pneumonia  2. Cardiomegaly with mild central congestion Electronically Signed   By: Donavan Foil M.D.   On: 02/19/2017 20:37   Dg Cervical Spine 2 Or 3 Views  Result Date: 02/19/2017 CLINICAL DATA:  Fall with neck pain EXAM: CERVICAL SPINE - 2-3 VIEW COMPARISON:  Cervical spine radiograph 05/10/2014 FINDINGS: There is narrowing of the intervertebral disc spaces of the lower cervical spine that is unchanged from the prior study. There is no fracture. Alignment is normal. IMPRESSION: Unchanged lower cervical degenerative disc disease compared to 05/10/2014. No acute abnormality. Electronically Signed   By: Ulyses Jarred M.D.   On: 02/19/2017 16:02   Dg Thoracic Spine W/swimmers  Result Date: 02/19/2017 CLINICAL DATA:  Fall with back pain EXAM: THORACIC SPINE - 3 VIEWS COMPARISON:  None. FINDINGS: There is no evidence of thoracic spine fracture. Alignment is normal. No other significant bone abnormalities are identified. There is calcific aortic atherosclerosis. IMPRESSION: No fracture or listhesis of the thoracic spine. Electronically Signed   By: Ulyses Jarred M.D.   On: 02/19/2017 16:00   Ct Angio Chest Pe W And/or Wo Contrast  Result Date: 02/19/2017 CLINICAL  DATA:  76 y/o F; Left-sided chest pain radiating into back and down left arm. EXAM: CT ANGIOGRAPHY CHEST WITH CONTRAST TECHNIQUE: Multidetector CT imaging of the chest was performed using the standard protocol during bolus administration of intravenous contrast. Multiplanar CT image reconstructions and MIPs were obtained to evaluate the vascular anatomy. CONTRAST:  75 cc Isovue 370 COMPARISON:  06/08/2008 CT chest FINDINGS: Cardiovascular: Mild cardiomegaly. Dense mitral annular calcification. Mild coronary artery calcification. No pericardial effusion. Normal caliber thoracic aorta with moderate calcific atherosclerosis. Enlarged main pulmonary artery measuring 4.0 cm, progressed from 2010. Satisfactory opacification of the main pulmonary artery. No  pulmonary embolus identified. Mediastinum/Nodes: No enlarged mediastinal, hilar, or axillary lymph nodes. Thyroid gland, trachea, and esophagus demonstrate no significant findings. Lungs/Pleura: Smooth interlobular septal thickening and hazy ground-glass opacities greatest in lung bases is likely to represent interstitial and mild alveolar pulmonary edema. There is peribronchial thickening diffusely. No pleural effusion. Upper Abdomen: No acute abnormality. Musculoskeletal: No chest wall abnormality. No acute or significant osseous findings. Chronic right tenth posterior rib fracture. Review of the MIP images confirms the above findings. IMPRESSION: 1. No pulmonary embolus identified. 2. Mild cardiomegaly. Dense mitral annular calcification. Mild coronary artery calcification. 3. 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 4. Interstitial and mild alveolar pulmonary edema. Electronically Signed   By: Kristine Garbe M.D.   On: 02/19/2017 23:09    EKG: Orders placed or performed during the hospital encounter of 02/19/17  . EKG 12-Lead  . EKG 12-Lead  . ED EKG within 10 minutes  . ED EKG within 10 minutes  . ED EKG  . ED EKG    IMPRESSION AND PLAN: 76 year old female patient with history of hypertension, fibromyalgia, arthritis, thyroid disease presented to the emergency room with chest pain.  Admitting diagnosis 1. Non-ST elevation myocardial infarction 2. Angina 3. Hypertension 4. Hyponatremia Treatment plan Admit patient to stepdown unit Continue IV heparin drip and IV nitroglycerin drip Cycle troponin Check echocardiogram Cardiology consultation Nothing by mouth for now Follow-up electrolytes Aspirin 81 mg daily Intensivist on call notified   All the records are reviewed and case discussed with ED provider. Management plans discussed with the patient, family and they are in agreement.  CODE  STATUS:FULL CODE Code Status History    This patient does not have a recorded code status. Please follow your organizational policy for patients in this situation.       TOTAL TIME TAKING CARE OF THIS PATIENT: 55 minutes.    Saundra Shelling M.D on 02/20/2017 at 2:20 AM  Between 7am to 6pm - Pager - [phone]  After 6pm go to [email or web address] - password [email or web address] [facility]  [facility]  Office  [phone]  CC: Primary care physician; [name], MD

## 2017-02-20 NOTE — Progress Notes (Signed)
  Echocardiogram Echocardiogram Transesophageal has been performed.  Stacey [name] 02/20/2017, 1:57 PM

## 2017-02-21 ENCOUNTER — Inpatient Hospital Stay (HOSPITAL_COMMUNITY): Payer: Medicare Other

## 2017-02-21 ENCOUNTER — Encounter (HOSPITAL_COMMUNITY): Payer: Self-pay | Admitting: Cardiothoracic Surgery

## 2017-02-21 LAB — GLUCOSE, CAPILLARY
Glucose-Capillary: 104 mg/dL — ABNORMAL HIGH (ref 65–99)
Glucose-Capillary: 109 mg/dL — ABNORMAL HIGH (ref 65–99)
Glucose-Capillary: 114 mg/dL — ABNORMAL HIGH (ref 65–99)
Glucose-Capillary: 115 mg/dL — ABNORMAL HIGH (ref 65–99)
Glucose-Capillary: 140 mg/dL — ABNORMAL HIGH (ref 65–99)
Glucose-Capillary: 143 mg/dL — ABNORMAL HIGH (ref 65–99)
Glucose-Capillary: 150 mg/dL — ABNORMAL HIGH (ref 65–99)
Glucose-Capillary: 167 mg/dL — ABNORMAL HIGH (ref 65–99)
Glucose-Capillary: 167 mg/dL — ABNORMAL HIGH (ref 65–99)
Glucose-Capillary: 76 mg/dL (ref 65–99)
Glucose-Capillary: 77 mg/dL (ref 65–99)
Glucose-Capillary: 91 mg/dL (ref 65–99)

## 2017-02-21 LAB — CREATININE, SERUM
CREATININE: 1.57 mg/dL — AB (ref 0.44–1.00)
GFR, EST AFRICAN AMERICAN: 36 mL/min — AB (ref >60)
GFR, EST NON AFRICAN AMERICAN: 31 mL/min — AB (ref >60)

## 2017-02-21 LAB — POCT I-STAT 3, ART BLOOD GAS (G3+)
Acid-base deficit: 1 mmol/L (ref 0.0–2.0)
Bicarbonate: 24.9 mmol/L (ref 20.0–28.0)
Bicarbonate: 26 mmol/L (ref 20.0–28.0)
O2 Saturation: 92 %
O2 Saturation: 94 %
Patient temperature: 37.6
Patient temperature: 97.8
TCO2: 26 mmol/L (ref 22–32)
TCO2: 28 mmol/L (ref 22–32)
pCO2 arterial: 45.2 mmHg (ref 32.0–48.0)
pCO2 arterial: 48.3 mmHg — ABNORMAL HIGH (ref 32.0–48.0)
pH, Arterial: 7.338 — ABNORMAL LOW (ref 7.350–7.450)
pH, Arterial: 7.352 (ref 7.350–7.450)
pO2, Arterial: 66 mmHg — ABNORMAL LOW (ref 83.0–108.0)
pO2, Arterial: 75 mmHg — ABNORMAL LOW (ref 83.0–108.0)

## 2017-02-21 LAB — BASIC METABOLIC PANEL
ANION GAP: 5 (ref 5–15)
Anion gap: 6 (ref 5–15)
BUN: 16 mg/dL (ref 6–20)
BUN: 22 mg/dL — ABNORMAL HIGH (ref 6–20)
CALCIUM: 7.5 mg/dL — AB (ref 8.9–10.3)
CHLORIDE: 107 mmol/L (ref 101–111)
CO2: 26 mmol/L (ref 22–32)
CO2: 24 mmol/L (ref 22–32)
CREATININE: 0.96 mg/dL (ref 0.44–1.00)
Calcium: 7.6 mg/dL — ABNORMAL LOW (ref 8.9–10.3)
Chloride: 105 mmol/L (ref 101–111)
Creatinine, Ser: 1.55 mg/dL — ABNORMAL HIGH (ref 0.44–1.00)
GFR calc Af Amer: >60 mL/min (ref >60)
GFR calc Af Amer: 37 mL/min — ABNORMAL LOW (ref >60)
GFR calc non Af Amer: 57 mL/min — ABNORMAL LOW (ref >60)
GFR calc non Af Amer: 32 mL/min — ABNORMAL LOW (ref >60)
Glucose, Bld: 99 mg/dL (ref 65–99)
Glucose, Bld: 143 mg/dL — ABNORMAL HIGH (ref 65–99)
Potassium: 3.7 mmol/L (ref 3.5–5.1)
Potassium: 4.9 mmol/L (ref 3.5–5.1)
Sodium: 138 mmol/L (ref 135–145)
Sodium: 135 mmol/L (ref 135–145)

## 2017-02-21 LAB — CBC
HCT: 23.9 % — ABNORMAL LOW (ref 36.0–46.0)
HEMATOCRIT: 25.6 % — AB (ref 36.0–46.0)
HEMOGLOBIN: 8.6 g/dL — AB (ref 12.0–15.0)
HEMOGLOBIN: 7.6 g/dL — AB (ref 12.0–15.0)
MCH: 29.4 pg (ref 26.0–34.0)
MCH: 28.6 pg (ref 26.0–34.0)
MCHC: 33.6 g/dL (ref 30.0–36.0)
MCHC: 31.8 g/dL (ref 30.0–36.0)
MCV: 87.4 fL (ref 78.0–100.0)
MCV: 89.8 fL (ref 78.0–100.0)
PLATELETS: 149 10*3/uL — AB (ref 150–400)
Platelets: 158 10*3/uL (ref 150–400)
RBC: 2.93 MIL/uL — AB (ref 3.87–5.11)
RBC: 2.66 MIL/uL — AB (ref 3.87–5.11)
RDW: 13.2 % (ref 11.5–15.5)
RDW: 13.9 % (ref 11.5–15.5)
WBC: 9.2 10*3/uL (ref 4.0–10.5)
WBC: 10.3 10*3/uL (ref 4.0–10.5)

## 2017-02-21 LAB — MAGNESIUM
MAGNESIUM: 2.7 mg/dL — AB (ref 1.7–2.4)
Magnesium: 3 mg/dL — ABNORMAL HIGH (ref 1.7–2.4)

## 2017-02-21 MED ORDER — INSULIN ASPART 100 UNIT/ML ~~LOC~~ SOLN
0.0000 [IU] | SUBCUTANEOUS | Status: DC
  Administered 2017-02-21: 4 [IU] via SUBCUTANEOUS
  Administered 2017-02-21: 2 [IU] via SUBCUTANEOUS
  Administered 2017-02-21: 4 [IU] via SUBCUTANEOUS
  Administered 2017-02-21 – 2017-02-22 (×4): 2 [IU] via SUBCUTANEOUS

## 2017-02-21 MED ORDER — INSULIN ASPART 100 UNIT/ML ~~LOC~~ SOLN: 0.0000 [IU] | SUBCUTANEOUS | Status: DC

## 2017-02-21 MED ORDER — POTASSIUM CHLORIDE 10 MEQ/50ML IV SOLN
10.0000 meq | INTRAVENOUS | Status: AC
  Administered 2017-02-21 (×3): 10 meq via INTRAVENOUS
  Filled 2017-02-21 (×3): qty 50

## 2017-02-21 MED ORDER — FUROSEMIDE 10 MG/ML IJ SOLN: 40.0000 mg | Freq: Once | INTRAMUSCULAR | Status: DC

## 2017-02-21 MED ORDER — FUROSEMIDE 10 MG/ML IJ SOLN
40.0000 mg | Freq: Once | INTRAMUSCULAR | Status: AC
  Administered 2017-02-21: 40 mg via INTRAVENOUS
  Filled 2017-02-21: qty 4

## 2017-02-21 MED ORDER — ENOXAPARIN SODIUM 30 MG/0.3ML ~~LOC~~ SOLN
30.0000 mg | SUBCUTANEOUS | Status: DC
  Administered 2017-02-21 – 2017-02-22 (×2): 30 mg via SUBCUTANEOUS
  Filled 2017-02-21 (×2): qty 0.3

## 2017-02-21 NOTE — Procedures (Signed)
Extubation Procedure Note  Patient Details:   Name: Stacey [name] DOB: 1941-02-23 MRN: [id number]   Airway Documentation:   PAtient extubated at [place] and placed on a 4l O2 Oxygen through the nose. Patient doing well, verbalized name and did well with her IS post extubation.  Evaluation  O2 sats: stable throughout Complications: No apparent complications Patient did tolerate procedure well. Bilateral Breath Sounds: Clear, Diminished   Yes Vent cleaned and removed from room at this time Evonnie Dawes 02/21/2017, 12:31 AM

## 2017-02-21 NOTE — Addendum Note (Signed)
Addendum  created 02/21/17 0255 by Suzy Bouchard, CRNA   Anesthesia Intra Flowsheets edited

## 2017-02-21 NOTE — Progress Notes (Addendum)
[address]       [place] [address]             [phone]      1 Day Post-Op Procedure(s) (LRB): CORONARY ARTERY BYPASS GRAFTING (CABG) x , three using left internal mammary artery to left anterior descending coronary artery and right greater saphenous vein harvested endoscopically to distal right and diagonal coronary arteries. (N/A) TRANSESOPHAGEAL ECHOCARDIOGRAM (TEE) (N/A)   Subjective:  Patient complains of pain.  She appears very sleepy/sedated this morning.  Husband is at bedside.  Objective: Vital signs in last 24 hours: Temp:  [96.8 F (36 C)-98.2 F (36.8 C)] 98.2 F (36.8 C) (09/30 0742) Pulse Rate:  [72-91] 89 (09/30 0800) Cardiac Rhythm: Heart block (09/30 0800) Resp:  [12-28] 18 (09/30 0800) BP: (83-116)/(43-68) 93/50 (09/30 0800) SpO2:  [88 %-98 %] 92 % (09/30 0800) Arterial Line BP: (71-150)/(39-80) 88/39 (09/30 0800) FiO2 (%):  [40 %-50 %] 40 % (09/29 2340) Weight:  [181 lb 3.5 oz (82.2 kg)-186 lb 1.1 oz (84.4 kg)] 186 lb 1.1 oz (84.4 kg) (09/30 0600)  Hemodynamic parameters for last 24 hours: PAP: (28-46)/(14-21) 40/20  Intake/Output from previous day: 09/29 0701 - 09/30 0700 In: 5676 [P.O.:200; I.V.:3746; Blood:480; IV Piggyback:1250] Out: 6270 [Urine:1795; Drains:35; Blood:900; Chest Tube:450] Intake/Output this shift: Total I/O In: 97.5 [I.V.:97.5] Out: -   General appearance: alert, cooperative and no distress Heart: regular rate and rhythm Lungs: diminished breath sounds bibasilar Abdomen: soft, non-tender; bowel sounds normal; no masses,  no organomegaly Extremities: edema mild Wound: clean and dry, sutures on RLE EVH sites  Lab Results:  Recent Labs  02/20/17 1910 02/21/17 0320  WBC 19.3* 9.2  HGB 10.0* 8.6*  HCT 30.6* 25.6*  PLT 172 149*   BMET:  Recent Labs  02/20/17 0738  02/20/17 1756 02/20/17 1906 02/21/17 0320  NA 135  < > 137 140 138  K 4.1  < > 4.2 4.0 3.7  CL 101  < > 99*  --  107  CO2  26  --   --   --  26  GLUCOSE 146*  < > 134* 134* 99  BUN 20  < > 17  --  16  CREATININE 0.79  < > 0.70  --  0.96  CALCIUM 9.0  --   --   --  7.5*  < > = values in this interval not displayed.  PT/INR:  Recent Labs  02/20/17 1910  LABPROT 16.9*  INR 1.38   ABG    Component Value Date/Time   PHART 7.338 (L) 02/21/2017 0133   HCO3 26.0 02/21/2017 0133   TCO2 28 02/21/2017 0133   ACIDBASEDEF 1.0 02/21/2017 0000   O2SAT 92.0 02/21/2017 0133   CBG (last 3)   Recent Labs  02/21/17 0501 02/21/17 0537 02/21/17 0743  GLUCAP 76 91 140*    Assessment/Plan: S/P Procedure(s) (LRB): CORONARY ARTERY BYPASS GRAFTING (CABG) x , three using left internal mammary artery to left anterior descending coronary artery and right greater saphenous vein harvested endoscopically to distal right and diagonal coronary arteries. (N/A) TRANSESOPHAGEAL ECHOCARDIOGRAM (TEE) (N/A)  1. CV- Paced at 90 BPM, currently NSR in the 80s underneath- remains on Milrinone and Neo.. Will wean as tolerated 2. Pulm- chest tube with 450 cc output, small pleural effusion on the left, + atelectasis.. Patient with sats in the upper 80s-- will leave tubes today, encouraged use of IS by nursing.Marland Kitchen However patient is fairly sedated 3. Renal-  creatinine WNL, weight is only 5 lbs above baseline, hold off on Lasix for now, K at 3.7 4. Expected post operative blood loss anemia- Hgb at 8.6 will montior 5. Pain control- patient with Fibromyalgia, states she is having pain, but very sedated on current regimen.. Will not adjust 6. CBGs- patient not a diabetic, will transition of insulin drip 7. Dispo- patient stable, wean milrinone/neo as tolerated, watch hemoglobin, leave chest tubes and Jp drain in place today, POD #1 progression orders   LOS: 1 day    BARRETT, ERIN 02/21/2017  Renal function stable D/c mt and aline  On low dose milrinone I have seen and examined [name] and agree with the above assessment  and  plan.  Grace Isaac MD Beeper [phone] Office [phone] 02/21/2017 5:31 PM

## 2017-02-22 ENCOUNTER — Telehealth: Payer: Self-pay

## 2017-02-22 ENCOUNTER — Inpatient Hospital Stay (HOSPITAL_COMMUNITY): Payer: Medicare Other

## 2017-02-22 ENCOUNTER — Encounter: Payer: Self-pay | Admitting: Cardiology

## 2017-02-22 ENCOUNTER — Other Ambulatory Visit: Payer: Self-pay | Admitting: Physician Assistant

## 2017-02-22 DIAGNOSIS — M158 Other polyosteoarthritis: Secondary | ICD-10-CM

## 2017-02-22 LAB — GLUCOSE, CAPILLARY
Glucose-Capillary: 121 mg/dL — ABNORMAL HIGH (ref 65–99)
Glucose-Capillary: 136 mg/dL — ABNORMAL HIGH (ref 65–99)
Glucose-Capillary: 150 mg/dL — ABNORMAL HIGH (ref 65–99)
Glucose-Capillary: 118 mg/dL — ABNORMAL HIGH (ref 65–99)
Glucose-Capillary: 141 mg/dL — ABNORMAL HIGH (ref 65–99)
Glucose-Capillary: 102 mg/dL — ABNORMAL HIGH (ref 65–99)

## 2017-02-22 LAB — BASIC METABOLIC PANEL
ANION GAP: 3 — AB (ref 5–15)
BUN: 24 mg/dL — ABNORMAL HIGH (ref 6–20)
CALCIUM: 7.8 mg/dL — AB (ref 8.9–10.3)
CHLORIDE: 107 mmol/L (ref 101–111)
CO2: 24 mmol/L (ref 22–32)
Creatinine, Ser: 1.66 mg/dL — ABNORMAL HIGH (ref 0.44–1.00)
GFR calc Af Amer: 34 mL/min — ABNORMAL LOW (ref >60)
GFR calc non Af Amer: 29 mL/min — ABNORMAL LOW (ref >60)
Glucose, Bld: 124 mg/dL — ABNORMAL HIGH (ref 65–99)
POTASSIUM: 4.7 mmol/L (ref 3.5–5.1)
Sodium: 134 mmol/L — ABNORMAL LOW (ref 135–145)

## 2017-02-22 LAB — CBC
HCT: 22.2 % — ABNORMAL LOW (ref 36.0–46.0)
HEMOGLOBIN: 7.2 g/dL — AB (ref 12.0–15.0)
MCH: 29.1 pg (ref 26.0–34.0)
MCHC: 32.4 g/dL (ref 30.0–36.0)
MCV: 89.9 fL (ref 78.0–100.0)
Platelets: 127 10*3/uL — ABNORMAL LOW (ref 150–400)
RBC: 2.47 MIL/uL — AB (ref 3.87–5.11)
RDW: 14.4 % (ref 11.5–15.5)
WBC: 8.3 10*3/uL (ref 4.0–10.5)

## 2017-02-22 LAB — HEMOGLOBIN AND HEMATOCRIT, BLOOD
HCT: 24.7 % — ABNORMAL LOW (ref 36.0–46.0)
Hemoglobin: 7.9 g/dL — ABNORMAL LOW (ref 12.0–15.0)

## 2017-02-22 LAB — PREPARE RBC (CROSSMATCH)

## 2017-02-22 LAB — TSH: TSH: 1.675 u[IU]/mL (ref 0.350–4.500)

## 2017-02-22 MED ORDER — AMIODARONE HCL IN DEXTROSE 360-4.14 MG/200ML-% IV SOLN
30.0000 mg/h | INTRAVENOUS | Status: DC
  Administered 2017-02-22 – 2017-02-23 (×2): 30 mg/h via INTRAVENOUS
  Filled 2017-02-22 (×2): qty 200

## 2017-02-22 MED ORDER — AMIODARONE HCL IN DEXTROSE 360-4.14 MG/200ML-% IV SOLN
60.0000 mg/h | INTRAVENOUS | Status: DC
  Administered 2017-02-22: 60 mg/h via INTRAVENOUS
  Filled 2017-02-22: qty 200

## 2017-02-22 MED ORDER — FUROSEMIDE 10 MG/ML IJ SOLN
40.0000 mg | Freq: Once | INTRAMUSCULAR | Status: DC
  Filled 2017-02-22: qty 4

## 2017-02-22 MED ORDER — SODIUM CHLORIDE 0.9 % IV SOLN
Freq: Once | INTRAVENOUS | Status: AC
  Administered 2017-02-22: 13:00:00 via INTRAVENOUS

## 2017-02-22 MED ORDER — FOLIC ACID 1 MG PO TABS
1.0000 mg | ORAL_TABLET | Freq: Every day | ORAL | Status: DC
  Administered 2017-02-22 – 2017-02-28 (×7): 1 mg via ORAL
  Filled 2017-02-22 (×7): qty 1

## 2017-02-22 MED FILL — Lidocaine HCl IV Inj 20 MG/ML: INTRAVENOUS | Qty: 5 | Status: AC

## 2017-02-22 MED FILL — Electrolyte-R (PH 7.4) Solution: INTRAVENOUS | Qty: 3000 | Status: AC

## 2017-02-22 MED FILL — Heparin Sodium (Porcine) Inj 1000 Unit/ML: INTRAMUSCULAR | Qty: 10 | Status: AC

## 2017-02-22 MED FILL — Sodium Bicarbonate IV Soln 8.4%: INTRAVENOUS | Qty: 50 | Status: AC

## 2017-02-22 MED FILL — Sodium Chloride IV Soln 0.9%: INTRAVENOUS | Qty: 2000 | Status: AC

## 2017-02-22 MED FILL — Mannitol IV Soln 20%: INTRAVENOUS | Qty: 500 | Status: AC

## 2017-02-22 NOTE — Telephone Encounter (Signed)
-----   Message from [name], MD sent at 02/19/2017  5:29 PM EDT ----- Mild DDD.

## 2017-02-22 NOTE — Telephone Encounter (Signed)
Patients spouse advised. KW

## 2017-02-22 NOTE — Telephone Encounter (Signed)
[id number] 

## 2017-02-22 NOTE — Evaluation (Signed)
Physical Therapy Evaluation Patient Details Name: Stacey [name] MRN: [id number] DOB: [birth year] Today's Date: 02/22/2017   History of Present Illness  Stacey [name] y.o.femaletransferred emergently from [place] for consideration of cabg .On 02/19/2017 she experienced 2 episodes of substernal chest pressure with associated shortness of breath which lasted approximately 15-20 minutes. In [facility] ED Initial troponin was elevated to 1.2. Follow-up troponins were 4.34 and 6.38. 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 PMH: fibromyalgia, HTN, thyroid disease, PONV.   Clinical Impression  Pt admitted with above diagnosis. Pt currently with functional limitations due to the deficits listed below (see PT Problem List). Pt lethargic this morning likely due to pain meds. Stood with +2 min A but was dizzy, pivoted to bed with +2 min A. Pt's family has thought through home environment and current plan is to go home. Though if progress is not as expected, they are not disagreeable to SNF then home.  Pt will benefit from skilled PT to increase their independence and safety with mobility to allow discharge to the venue listed below.       Follow Up Recommendations Home health PT;Supervision/Assistance - 24 hour    Equipment Recommendations  Rolling walker with 5" wheels    Recommendations for Other Services OT consult     Precautions / Restrictions Precautions Precautions: Fall;Sternal Precaution Comments: reviewed sternal precautions, pt with appropriate use of pillow Restrictions Weight Bearing Restrictions: No Other Position/Activity Restrictions: chest tube, external pacer, and JP R ankle      Mobility  Bed Mobility Overal bed mobility: Needs Assistance Bed Mobility: Sit to Supine       Sit to supine: Mod assist;+2 for physical assistance   General bed mobility comments: vc's for sequencing to decreased pressure on sternum, mod A to LE's for back into bed, mod A at trunk to maintain SL  until ready to roll onto back  Transfers Overall transfer level: Needs assistance Equipment used: Rolling walker (2 wheeled) Transfers: Sit to/from [facility] Sit to Stand: Min assist;+2 physical assistance Stand pivot transfers: Min assist;+2 physical assistance       General transfer comment: pt dizzy in standing and has had low BP today, returned to sitting once before pivoting to chair. Pt also very lethargic. Second time she stood she was slightly more alert. Took pivot steps to bed with min A +2 and use of RW  Ambulation/Gait             General Gait Details: only stepped to bed due to lethargy and dizziness  Stairs            Wheelchair Mobility    Modified Rankin (Stroke Patients Only)       Balance Overall balance assessment: Needs assistance Sitting-balance support: No upper extremity supported Sitting balance-Leahy Scale: Fair     Standing balance support: Bilateral upper extremity supported Standing balance-Leahy Scale: Poor Standing balance comment: reliant on UE support today                             Pertinent Vitals/Pain Pain Assessment: Faces Faces Pain Scale: Hurts even more Pain Location: chest and R leg at graft site Pain Descriptors / Indicators: Aching;Sore;Grimacing Pain Intervention(s): Limited activity within patient's tolerance;Monitored during session;Premedicated before session    Home Living Family/patient expects to be discharged to:: Private residence Living Arrangements: Spouse/significant other Available Help at Discharge: Family;Available 24 hours/day Type of Home: House Home Access: Stairs to enter Entrance  Stairs-Rails: None Entrance Stairs-Number of Steps: 2 Home Layout: Two level;1/2 bath on main level;Able to live on main level with bedroom/bathroom Home Equipment: Hospital bed;Bedside commode Additional Comments: plan of for pt to stay downstairs in hospital bed until she is ready  for flight of stairs. They have a close friend that is a HHPT who is planning to work with her and loan them a [place].     Prior Function Level of Independence: Independent         Comments: keeps children in her home     Hand Dominance        Extremity/Trunk Assessment   Upper Extremity Assessment Upper Extremity Assessment: Overall WFL for tasks assessed    Lower Extremity Assessment Lower Extremity Assessment: RLE deficits/detail RLE Deficits / Details: sore and stiff right knee due to graft site RLE: Unable to fully assess due to pain    Cervical / Trunk Assessment Cervical / Trunk Assessment: Kyphotic  Communication   Communication: No difficulties  Cognition Arousal/Alertness: Lethargic;Suspect due to medications Behavior During Therapy: Flat affect Overall Cognitive Status: Within Functional Limits for tasks assessed                                 General Comments: pt slow to respond today but seems related to pain meds      General Comments General comments (skin integrity, edema, etc.): O2 sats 96% on 6L O2, HR 86 bpm    Exercises     Assessment/Plan    PT Assessment Patient needs continued PT services  PT Problem List Decreased strength;Decreased activity tolerance;Decreased balance;Decreased mobility;Decreased knowledge of use of DME;Decreased safety awareness;Decreased knowledge of precautions;Pain;Obesity;Cardiopulmonary status limiting activity       PT Treatment Interventions DME instruction;Gait training;Stair training;Functional mobility training;Therapeutic activities;Therapeutic exercise;Balance training;Patient/family education    PT Goals (Current goals can be found in the Care Plan section)  Acute Rehab PT Goals Patient Stated Goal: return home PT Goal Formulation: With patient Time For Goal Achievement: 03/08/17 Potential to Achieve Goals: Good    Frequency Min 3X/week   Barriers to discharge        Co-evaluation                AM-PAC PT "6 Clicks" Daily Activity  Outcome Measure Difficulty turning over in bed (including adjusting bedclothes, sheets and blankets)?: Unable Difficulty moving from lying on back to sitting on the side of the bed? : Unable Difficulty sitting down on and standing up from a chair with arms (e.g., wheelchair, bedside commode, etc,.)?: Unable Help needed moving to and from a bed to chair (including a wheelchair)?: A Lot Help needed walking in hospital room?: A Lot Help needed climbing 3-5 steps with a railing? : Total 6 Click Score: 8    End of Session   Activity Tolerance: Patient limited by lethargy Patient left: in bed;with call bell/phone within reach;with family/visitor present;with nursing/sitter in room Nurse Communication: Mobility status PT Visit Diagnosis: Muscle weakness (generalized) (M62.81);Other abnormalities of gait and mobility (R26.89);Dizziness and giddiness (R42);Pain Pain - Right/Left: Right Pain - part of body: Leg (and chest)    Time: 1610-9604 PT Time Calculation (min) (ACUTE ONLY): 36 min   Charges:   PT Evaluation $PT Eval Moderate Complexity: 1 Mod PT Treatments $Therapeutic Activity: 8-22 mins   PT G Codes:        Leighton Roach, [place]  [phone]  [place] 02/22/2017, 12:41 PM

## 2017-02-22 NOTE — Care Management Note (Addendum)
Case Management Note  Patient Details  Name: Stacey [name] MRN: [id number] Date of Birth: 1940/06/16  Subjective/Objective:   From home with spouse,  POD 2 CABG, afib, wean milrinone, neo,  amio, cont to diurese, cret rising, conts with one chest tube.  Per pt eval rec HHPT, spouse chose [facility] , referral given to [facility] with [facility] for HHPT/ HHOT  and rolling walker.  Soc will begin 24-48 hrs post dc. Will need HHPT / HHOT orders with face to face and rolling walker order.  Granddaughter also wants her to have OT eval.  NCM informed Archivist, she will check with Dr. Servando Snare for this order.                   Action/Plan: NCM will cont to follow for dc needs.   Expected Discharge Date:                  Expected Discharge Plan:  [place]  In-House Referral:     Discharge planning Services  CM Consult  Post Acute Care Choice:  Home Health Choice offered to:  Spouse  DME Arranged:    DME Agency:     HH Arranged:  PT [place]:  [place]  Status of Service:  In process, will continue to follow  If discussed at Long Length of Stay Meetings, dates discussed:    Additional Comments:  Zenon Mayo, RN 02/22/2017, 3:16 PM

## 2017-02-22 NOTE — Progress Notes (Signed)
CT surgery p.m. Rounds  Patient resting comfortably Normal sinus rhythm, IV amiodarone drip Urine output 30 cc/h Hemoglobin 8.0 this p.m.

## 2017-02-22 NOTE — Progress Notes (Addendum)
TCTS DAILY ICU PROGRESS NOTE                   [address]            [place] [address]          [phone]   2 Days Post-Op Procedure(s) (LRB): CORONARY ARTERY BYPASS GRAFTING (CABG) x , three using left internal mammary artery to left anterior descending coronary artery and right greater saphenous vein harvested endoscopically to distal right and diagonal coronary arteries. (N/A) TRANSESOPHAGEAL ECHOCARDIOGRAM (TEE) (N/A)  Total Length of Stay:  LOS: 2 days   Subjective: In afib this am  Objective: Vital signs in last 24 hours: Temp:  [98.3 F (36.8 C)-98.8 F (37.1 C)] 98.4 F (36.9 C) (10/01 0753) Pulse Rate:  [88-111] 88 (10/01 0700) Cardiac Rhythm: Normal sinus rhythm;Atrial fibrillation (09/30 2000) Resp:  [12-26] 14 (10/01 0700) BP: (78-109)/(45-67) 97/59 (10/01 0700) SpO2:  [78 %-96 %] 96 % (10/01 0700) Arterial Line BP: (84-131)/(39-56) 108/51 (10/01 0500) FiO2 (%):  [40 %] 40 % (09/30 1446) Weight:  [190 lb (86.2 kg)] 190 lb (86.2 kg) (10/01 0500)  Filed Weights   02/20/17 1900 02/21/17 0600 02/22/17 0500  Weight: 181 lb 3.5 oz (82.2 kg) 186 lb 1.1 oz (84.4 kg) 190 lb (86.2 kg)    Weight change: 8 lb 12.5 oz (3.983 kg)   Hemodynamic parameters for last 24 hours:    Intake/Output from previous day: 09/30 0701 - 10/01 0700 In: 2237.8 [P.O.:240; I.V.:1997.8] Out: 1240 [Urine:775; Drains:115; Chest Tube:350]  Intake/Output this shift: No intake/output data recorded.  Current Meds: Scheduled Meds: . acetaminophen  1,000 mg Oral Q6H   Or  . acetaminophen (TYLENOL) oral liquid 160 mg/5 mL  1,000 mg Per Tube Q6H  . aspirin EC  325 mg Oral Daily   Or  . aspirin  324 mg Per Tube Daily  . bisacodyl  10 mg Oral Daily   Or  . bisacodyl  10 mg Rectal Daily  . buPROPion  150 mg Oral Daily  . celecoxib  200 mg Oral Daily  . chlorhexidine gluconate (MEDLINE KIT)  15 mL Mouth Rinse BID  . Chlorhexidine Gluconate Cloth  6 each Topical  Daily  . docusate sodium  200 mg Oral Daily  . enoxaparin (LOVENOX) injection  30 mg Subcutaneous Q24H  . escitalopram  10 mg Oral Daily  . fenofibrate  54 mg Oral Daily  . insulin aspart  0-24 Units Subcutaneous Q4H  . levalbuterol  0.63 mg Nebulization TID  . levothyroxine  88 mcg Oral QAC breakfast  . mouth rinse  15 mL Mouth Rinse QID  . metoprolol tartrate  12.5 mg Oral BID   Or  . metoprolol tartrate  12.5 mg Per Tube BID  . pantoprazole  40 mg Oral Daily  . sodium chloride flush  10-40 mL Intracatheter Q12H  . sodium chloride flush  3 mL Intravenous Q12H   Continuous Infusions: . sodium chloride Stopped (02/22/17 0600)  . sodium chloride    . sodium chloride Stopped (02/20/17 1930)  . dexmedetomidine (PRECEDEX) IV infusion Stopped (02/21/17 0430)  . lactated ringers    . lactated ringers    . lactated ringers 10 mL/hr at 02/22/17 0600  . milrinone 0.2 mcg/kg/min (02/22/17 0600)  . nitroGLYCERIN Stopped (02/20/17 1915)  . phenylephrine (NEO-SYNEPHRINE) Adult infusion 30 mcg/min (02/22/17 0600)   PRN Meds:.sodium chloride, fentaNYL (SUBLIMAZE) injection, lactated ringers, metoprolol tartrate, midazolam, ondansetron (ZOFRAN)  IV, oxyCODONE, sodium chloride flush, sodium chloride flush, traMADol, zolpidem  General appearance: alert, cooperative, fatigued and no distress Heart: irregularly irregular rhythm Lungs: dim in bases Abdomen: soft, nontender Extremities: + mild edema Wound: incis healing well  Lab Results: CBC: Recent Labs  02/21/17 1702 02/22/17 0416  WBC 10.3 8.3  HGB 7.6* 7.2*  HCT 23.9* 22.2*  PLT 158 127*   BMET:  Recent Labs  02/21/17 1702 02/22/17 0416  NA 135 134*  K 4.9 4.7  CL 105 107  CO2 24 24  GLUCOSE 143* 124*  BUN 22* 24*  CREATININE 1.55*  1.57* 1.66*  CALCIUM 7.6* 7.8*    CMET: Lab Results  Component Value Date   WBC 8.3 02/22/2017   HGB 7.2 (L) 02/22/2017   HCT 22.2 (L) 02/22/2017   PLT 127 (L) 02/22/2017   GLUCOSE 124  (H) 02/22/2017   CHOL 183 02/20/2017   TRIG 96 02/20/2017   HDL 58 02/20/2017   LDLCALC 106 (H) 02/20/2017   ALT 12 12/18/2016   AST 16 12/18/2016   NA 134 (L) 02/22/2017   K 4.7 02/22/2017   CL 107 02/22/2017   CREATININE 1.66 (H) 02/22/2017   BUN 24 (H) 02/22/2017   CO2 24 02/22/2017   TSH 1.270 12/18/2016   INR 1.38 02/20/2017   HGBA1C 5.4 12/18/2016      PT/INR:  Recent Labs  02/20/17 1910  LABPROT 16.9*  INR 1.38   Radiology: Dg Chest Port 1 View  Result Date: 02/22/2017 CLINICAL DATA:  Status post CABG EXAM: PORTABLE CHEST 1 VIEW COMPARISON:  Yesterday FINDINGS: Right IJ sheath and left-sided thoracic drain remain. Unchanged cardiomegaly and aortic tortuosity. Hazy and streaky opacities at the bases. No Kerley lines or pneumothorax. IMPRESSION: Stable atelectasis.  No visible pneumothorax. Electronically Signed   By: Monte Fantasia M.D.   On: 02/22/2017 07:36     Assessment/Plan: S/P Procedure(s) (LRB): CORONARY ARTERY BYPASS GRAFTING (CABG) x , three using left internal mammary artery to left anterior descending coronary artery and right greater saphenous vein harvested endoscopically to distal right and diagonal coronary arteries. (N/A) TRANSESOPHAGEAL ECHOCARDIOGRAM (TEE) (N/A)  1 doing well 2 wean milrinone as able, neo is now off 3  Amiodarone for afib. MG yesterday is 2.7. K+ 4.7 4 reorder lasix for volume overload, monitor creat closely as is slowly rising 5 H/H is close to transfusion threshhold, monitor closely 6 aggressive pulm toilet for atx as well as cont to mobilize 7 500 cc out chest tube yesterday- cont for now   GOLD,WAYNE E 02/22/2017 7:54 AM   Start on Cordarone  for Afib  Will need PT to get mobilized  I have seen and examined [name] and agree with the above assessment  and plan.  Grace Isaac MD [place] [phone] Office [phone] 02/22/2017 9:33 AM

## 2017-02-22 NOTE — Progress Notes (Signed)
1 unit PRBC verified utilizing Duck with Elink RN-Jill Glenford Peers and 2H RN-Pat [employer]. Pt MRN, armbad, blood bank band, and PRBC unit clearly visible for verification.

## 2017-02-23 ENCOUNTER — Inpatient Hospital Stay (HOSPITAL_COMMUNITY): Payer: Medicare Other

## 2017-02-23 ENCOUNTER — Ambulatory Visit: Payer: Self-pay | Admitting: Family Medicine

## 2017-02-23 LAB — BASIC METABOLIC PANEL
Anion gap: 7 (ref 5–15)
BUN: 27 mg/dL — ABNORMAL HIGH (ref 6–20)
CO2: 24 mmol/L (ref 22–32)
Calcium: 8.2 mg/dL — ABNORMAL LOW (ref 8.9–10.3)
Chloride: 100 mmol/L — ABNORMAL LOW (ref 101–111)
Creatinine, Ser: 1.41 mg/dL — ABNORMAL HIGH (ref 0.44–1.00)
GFR calc Af Amer: 41 mL/min — ABNORMAL LOW (ref >60)
GFR calc non Af Amer: 36 mL/min — ABNORMAL LOW (ref >60)
Glucose, Bld: 116 mg/dL — ABNORMAL HIGH (ref 65–99)
Potassium: 4.9 mmol/L (ref 3.5–5.1)
Sodium: 131 mmol/L — ABNORMAL LOW (ref 135–145)

## 2017-02-23 LAB — TYPE AND SCREEN
ABO/RH(D): A POS
Antibody Screen: NEGATIVE
Unit division: 0

## 2017-02-23 LAB — CBC
HCT: 25.9 % — ABNORMAL LOW (ref 36.0–46.0)
Hemoglobin: 8.4 g/dL — ABNORMAL LOW (ref 12.0–15.0)
MCH: 28.7 pg (ref 26.0–34.0)
MCHC: 32.4 g/dL (ref 30.0–36.0)
MCV: 88.4 fL (ref 78.0–100.0)
Platelets: 116 10*3/uL — ABNORMAL LOW (ref 150–400)
RBC: 2.93 MIL/uL — ABNORMAL LOW (ref 3.87–5.11)
RDW: 14.4 % (ref 11.5–15.5)
WBC: 6.6 10*3/uL (ref 4.0–10.5)

## 2017-02-23 LAB — BPAM RBC
Blood Product Expiration Date: 201810102359
ISSUE DATE / TIME: 201810011127
Unit Type and Rh: 6200

## 2017-02-23 LAB — GLUCOSE, CAPILLARY
Glucose-Capillary: 118 mg/dL — ABNORMAL HIGH (ref 65–99)
Glucose-Capillary: 103 mg/dL — ABNORMAL HIGH (ref 65–99)
Glucose-Capillary: 103 mg/dL — ABNORMAL HIGH (ref 65–99)
Glucose-Capillary: 124 mg/dL — ABNORMAL HIGH (ref 65–99)
Glucose-Capillary: 98 mg/dL (ref 65–99)
Glucose-Capillary: 91 mg/dL (ref 65–99)

## 2017-02-23 MED ORDER — FUROSEMIDE 10 MG/ML IJ SOLN
40.0000 mg | Freq: Once | INTRAMUSCULAR | Status: DC
  Filled 2017-02-23: qty 4

## 2017-02-23 MED ORDER — INSULIN ASPART 100 UNIT/ML ~~LOC~~ SOLN
0.0000 [IU] | Freq: Three times a day (TID) | SUBCUTANEOUS | Status: DC
  Administered 2017-02-23: 2 [IU] via SUBCUTANEOUS

## 2017-02-23 MED ORDER — AMIODARONE HCL 200 MG PO TABS: 200.0000 mg | ORAL_TABLET | Freq: Every day | ORAL | Status: DC

## 2017-02-23 MED ORDER — CHLORHEXIDINE GLUCONATE 0.12 % MT SOLN
OROMUCOSAL | Status: AC
  Filled 2017-02-23: qty 15

## 2017-02-23 MED ORDER — FUROSEMIDE 10 MG/ML IJ SOLN
INTRAMUSCULAR | Status: AC
  Administered 2017-02-23: 40 mg
  Filled 2017-02-23: qty 4

## 2017-02-23 MED ORDER — ASPIRIN 81 MG PO CHEW: 81.0000 mg | CHEWABLE_TABLET | Freq: Every day | ORAL | Status: DC

## 2017-02-23 MED ORDER — AMIODARONE HCL 200 MG PO TABS
200.0000 mg | ORAL_TABLET | Freq: Two times a day (BID) | ORAL | Status: DC
  Administered 2017-02-23 – 2017-02-28 (×11): 200 mg via ORAL
  Filled 2017-02-23 (×12): qty 1

## 2017-02-23 MED ORDER — ENOXAPARIN SODIUM 30 MG/0.3ML ~~LOC~~ SOLN
30.0000 mg | SUBCUTANEOUS | Status: DC
Start: 2017-02-23 — End: 2017-02-25
  Administered 2017-02-23 – 2017-02-24 (×2): 30 mg via SUBCUTANEOUS
  Filled 2017-02-23 (×3): qty 0.3

## 2017-02-23 MED ORDER — ENOXAPARIN SODIUM 30 MG/0.3ML ~~LOC~~ SOLN: 30.0000 mg | SUBCUTANEOUS | Status: DC

## 2017-02-23 MED ORDER — ASPIRIN EC 81 MG PO TBEC
81.0000 mg | DELAYED_RELEASE_TABLET | Freq: Every day | ORAL | Status: DC
  Administered 2017-02-23 – 2017-02-25 (×3): 81 mg via ORAL
  Filled 2017-02-23 (×3): qty 1

## 2017-02-23 NOTE — Progress Notes (Signed)
Patient ID: Stacey [name], female   DOB: [birth year], 76 y.o.   MRN: [id number] TCTS DAILY ICU PROGRESS NOTE                   [address]            [place] [address]          [phone]   3 Days Post-Op Procedure(s) (LRB): CORONARY ARTERY BYPASS GRAFTING (CABG) x , three using left internal mammary artery to left anterior descending coronary artery and right greater saphenous vein harvested endoscopically to distal right and diagonal coronary arteries. (N/A) TRANSESOPHAGEAL ECHOCARDIOGRAM (TEE) (N/A)  Total Length of Stay:  LOS: 3 days   Subjective: Feels better today , in sinus   Objective: Vital signs in last 24 hours: Temp:  [97.5 F (36.4 C)-98.6 F (37 C)] 97.8 F (36.6 C) (10/02 0802) Pulse Rate:  [63-106] 66 (10/02 0600) Cardiac Rhythm: Normal sinus rhythm (10/02 0400) Resp:  [11-25] 14 (10/02 0600) BP: (81-127)/(50-83) 111/62 (10/02 0600) SpO2:  [93 %-100 %] 98 % (10/02 0600) FiO2 (%):  [40 %] 40 % (10/01 1954) Weight:  [192 lb 8 oz (87.3 kg)] 192 lb 8 oz (87.3 kg) (10/02 0500)  Filed Weights   02/21/17 0600 02/22/17 0500 02/23/17 0500  Weight: 186 lb 1.1 oz (84.4 kg) 190 lb (86.2 kg) 192 lb 8 oz (87.3 kg)    Weight change: 2 lb 8 oz (1.134 kg)   Hemodynamic parameters for last 24 hours:    Intake/Output from previous day: 10/01 0701 - 10/02 0700 In: 1057.2 [I.V.:744.7; Blood:312.5] Out: 1005 [Urine:700; Drains:225; Chest Tube:80]  Intake/Output this shift: No intake/output data recorded.  Current Meds: Scheduled Meds: . acetaminophen  1,000 mg Oral Q6H   Or  . acetaminophen (TYLENOL) oral liquid 160 mg/5 mL  1,000 mg Per Tube Q6H  . aspirin EC  325 mg Oral Daily   Or  . aspirin  324 mg Per Tube Daily  . bisacodyl  10 mg Oral Daily   Or  . bisacodyl  10 mg Rectal Daily  . buPROPion  150 mg Oral Daily  . chlorhexidine gluconate (MEDLINE KIT)  15 mL Mouth Rinse BID  . Chlorhexidine Gluconate Cloth  6 each Topical Daily  .  docusate sodium  200 mg Oral Daily  . enoxaparin (LOVENOX) injection  30 mg Subcutaneous Q24H  . escitalopram  10 mg Oral Daily  . fenofibrate  54 mg Oral Daily  . folic acid  1 mg Oral Daily  . furosemide  40 mg Intravenous Once  . insulin aspart  0-24 Units Subcutaneous Q4H  . levalbuterol  0.63 mg Nebulization TID  . levothyroxine  88 mcg Oral QAC breakfast  . mouth rinse  15 mL Mouth Rinse QID  . metoprolol tartrate  12.5 mg Oral BID   Or  . metoprolol tartrate  12.5 mg Per Tube BID  . pantoprazole  40 mg Oral Daily  . sodium chloride flush  10-40 mL Intracatheter Q12H  . sodium chloride flush  3 mL Intravenous Q12H   Continuous Infusions: . sodium chloride Stopped (02/22/17 0600)  . sodium chloride    . sodium chloride Stopped (02/20/17 1930)  . amiodarone 30 mg/hr (02/23/17 0507)  . lactated ringers    . lactated ringers    . lactated ringers 10 mL/hr at 02/22/17 2000  . nitroGLYCERIN Stopped (02/20/17 1915)  . phenylephrine (NEO-SYNEPHRINE) Adult infusion 0 mcg/min (02/22/17 0800)   PRN  Meds:.sodium chloride, fentaNYL (SUBLIMAZE) injection, lactated ringers, metoprolol tartrate, ondansetron (ZOFRAN) IV, oxyCODONE, sodium chloride flush, sodium chloride flush, traMADol  General appearance: alert and cooperative Neurologic: intact Heart: regular rate and rhythm, S1, S2 normal, no murmur, click, rub or gallop Lungs: diminished breath sounds bibasilar Abdomen: soft, non-tender; bowel sounds normal; no masses,  no organomegaly Extremities: extremities normal, atraumatic, no cyanosis or edema and Homans sign is negative, no sign of DVT Wound: sternum inatct  Lab Results: CBC: Recent Labs  02/22/17 0416 02/22/17 1530 02/23/17 0301  WBC 8.3  --  6.6  HGB 7.2* 7.9* 8.4*  HCT 22.2* 24.7* 25.9*  PLT 127*  --  116*   BMET:  Recent Labs  02/22/17 0416 02/23/17 0301  NA 134* 131*  K 4.7 4.9  CL 107 100*  CO2 24 24  GLUCOSE 124* 116*  BUN 24* 27*  CREATININE 1.66*  1.41*  CALCIUM 7.8* 8.2*    CMET: Lab Results  Component Value Date   WBC 6.6 02/23/2017   HGB 8.4 (L) 02/23/2017   HCT 25.9 (L) 02/23/2017   PLT 116 (L) 02/23/2017   GLUCOSE 116 (H) 02/23/2017   CHOL 183 02/20/2017   TRIG 96 02/20/2017   HDL 58 02/20/2017   LDLCALC 106 (H) 02/20/2017   ALT 12 12/18/2016   AST 16 12/18/2016   NA 131 (L) 02/23/2017   K 4.9 02/23/2017   CL 100 (L) 02/23/2017   CREATININE 1.41 (H) 02/23/2017   BUN 27 (H) 02/23/2017   CO2 24 02/23/2017   TSH 1.675 02/22/2017   INR 1.38 02/20/2017   HGBA1C 5.4 12/18/2016      PT/INR:  Recent Labs  02/20/17 1910  LABPROT 16.9*  INR 1.38   Radiology: Dg Chest Port 1 View  Result Date: 02/23/2017 CLINICAL DATA:  Recent coronary artery bypass grafting. Chest tube in place EXAM: PORTABLE CHEST 1 VIEW COMPARISON:  February 22, 2017 FINDINGS: There is a chest tube on the left without evident pneumothorax. Cordis tip is in superior vena cava. Temporary pacemaker leads are attached to the right heart. There is cardiomegaly with mild pulmonary venous hypertension. There is atelectatic change in the mid and lower lung zones without frank consolidation. There is aortic atherosclerosis. There is no appreciable edema. No evident adenopathy. No bone lesions. IMPRESSION: Tube and catheter positions as described without evident pneumothorax. Mid and lower lung zone atelectasis without frank consolidation. Underlying pulmonary vascular congestion evident. There is aortic atherosclerosis. Aortic Atherosclerosis (ICD10-I70.0). Electronically Signed   By: Lowella Grip III M.D.   On: 02/23/2017 07:40     Assessment/Plan: S/P Procedure(s) (LRB): CORONARY ARTERY BYPASS GRAFTING (CABG) x , three using left internal mammary artery to left anterior descending coronary artery and right greater saphenous vein harvested endoscopically to distal right and diagonal coronary arteries. (N/A) TRANSESOPHAGEAL ECHOCARDIOGRAM (TEE)  (N/A) Mobilize Diuresis Diabetes control d/c tubes/lines H/h improved Convert to po cordrone     Grace Isaac 02/23/2017 8:12 AM

## 2017-02-23 NOTE — Progress Notes (Signed)
Chaplain stopped in and introduced herself to patient and patient's friend who is at bedside.  Patient says she is feeling somewhat better considering what she has gone through she is thankful to be here.  Chaplain will continue to provide support and care for this patient and would like to thank her medical professionals for their care for her.    02/23/17 1245  Clinical Encounter Type  Visited With Patient  Visit Type Initial;Psychological support;Spiritual support;Social support

## 2017-02-23 NOTE — Progress Notes (Signed)
      [address]       [place] [address]             [phone]      Up in chair  BP (!) 126/59   Pulse 65   Temp 97.7 F (36.5 C) (Oral)   Resp 15   Ht 5\' 2"  (1.575 m)   Wt 192 lb 8 oz (87.3 kg)   SpO2 97%   BMI 35.21 kg/m    Intake/Output Summary (Last 24 hours) at 02/23/17 1841 Last data filed at 02/23/17 1235  Gross per 24 hour  Intake           620.34 ml  Output             1915 ml  Net         -1294.66 ml   Stable day  Remo Lipps C. Roxan Hockey, MD [facility] [phone]

## 2017-02-23 NOTE — Evaluation (Signed)
Occupational Therapy Evaluation Patient Details Name: Stacey [name] MRN: [id number] DOB: June 26, 1940 Today's Date: 02/23/2017    History of Present Illness Stacey [name] y.o.femaletransferred emergently from [place] for consideration of cabg .On 02/19/2017 she experienced 2 episodes of substernal chest pressure with associated shortness of breath which lasted approximately 15-20 minutes. In [facility] ED Initial troponin was elevated to 1.2. Follow-up troponins were 4.34 and 6.38. 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 PMH: fibromyalgia, HTN, thyroid disease, PONV.    Clinical Impression   PTA, pt was independent with ADL and functional mobility and working. Pt currently requires max assist for LB ADL, min assist for UB ADL, and min assist +2 for toilet transfer ambulating with Harmon Pier walker. Pt educated on sternal precautions related to ADL participation and she is able to maintain with VC's this session. She was additionally able to stand at sink to complete grooming tasks with min assist this session. Pt would benefit from continued OT services while admitted to improve independence and safety with ADL prior to returning home with assistance from her daughter and home health OT follow-up. See general comments section for vital signs during session. OT will continue to follow while admitted.      Follow Up Recommendations  Home health OT;Supervision/Assistance - 24 hour    Equipment Recommendations  Tub/shower seat    Recommendations for Other Services       Precautions / Restrictions Precautions Precautions: Fall;Sternal Precaution Comments: reviewed sternal precautions, pt with appropriate use of pillow Restrictions Weight Bearing Restrictions: No Other Position/Activity Restrictions: chest tube, external pacer, and JP R ankle      Mobility Bed Mobility Overal bed mobility: Needs Assistance Bed Mobility: Sit to Sidelying;Rolling Rolling: Min assist       Sit to sidelying: Mod  assist;+2 for safety/equipment General bed mobility comments: Assist for B LE back into bed.   Transfers Overall transfer level: Needs assistance Equipment used: Rolling walker (2 wheeled) Transfers: Sit to/from Stand Sit to Stand: Min assist;+2 safety/equipment         General transfer comment: Min assist for lifting and steadying. Educated on sternal precautions and use of pillow to splint during standing.     Balance Overall balance assessment: Needs assistance Sitting-balance support: No upper extremity supported Sitting balance-Leahy Scale: Fair     Standing balance support: Bilateral upper extremity supported Standing balance-Leahy Scale: Poor Standing balance comment: reliant on UE support from [facility] walker today                           ADL either performed or assessed with clinical judgement   ADL Overall ADL's : Needs assistance/impaired Eating/Feeding: Supervision/ safety;Sitting   Grooming: Minimal assistance;Standing   Upper Body Bathing: Minimal assistance;Sitting   Lower Body Bathing: Maximal assistance;Sit to/from stand   Upper Body Dressing : Minimal assistance;Sitting   Lower Body Dressing: Maximal assistance;Sit to/from stand   Toilet Transfer: Minimal assistance;+2 for safety/equipment;Ambulation;BSC Harmon Pier walker)   Toileting- Clothing Manipulation and Hygiene: Maximal assistance;Sit to/from stand       Functional mobility during ADLs: Minimal assistance;+2 for safety/equipment Harmon Pier walker) General ADL Comments: Pt stood at sink to wash her hands and was able to ambulate in the hallway this session with Harmon Pier walker prior to use of [facility] for toileting task.      Vision Patient Visual Report: No change from baseline Vision Assessment?: No apparent visual deficits     Perception     Praxis  Pertinent Vitals/Pain Pain Assessment: Faces Faces Pain Scale: Hurts even more Pain Location: chest and R leg at graft site Pain Descriptors  / Indicators: Aching;Sore;Grimacing Pain Intervention(s): Limited activity within patient's tolerance;Monitored during session;Repositioned     Hand Dominance     Extremity/Trunk Assessment Upper Extremity Assessment Upper Extremity Assessment: RUE deficits/detail RUE Deficits / Details: history of rotator cuff pathology   Lower Extremity Assessment Lower Extremity Assessment: Defer to PT evaluation       Communication Communication Communication: No difficulties   Cognition Arousal/Alertness: Lethargic;Suspect due to medications Behavior During Therapy: Flat affect Overall Cognitive Status: Within Functional Limits for tasks assessed                                 General Comments: Pt becomes fixated with gaze during mobility (per daughter this is hyperfocus) and slow to respond. Pt with decreased follow through when attempting to talk therough breathing techniques during periods of intense focus in hallway.    General Comments  Pt on 5L O2 throughout session. Pt does demonstrate periods of time in which she holds her breath during ambulation with simultaneous SpO2 desaturation. Pt with desat to 78% briefly but quick return to 95% when educated concerning pursed lip breathing techniques.      Exercises     Shoulder Instructions      Home Living Family/patient expects to be discharged to:: Private residence Living Arrangements: Spouse/significant other Available Help at Discharge: Family;Available 24 hours/day Type of Home: House Home Access: Stairs to enter [employer] of Steps: 2 Entrance Stairs-Rails: None Home Layout: Two level;1/2 bath on main level;Able to live on main level with bedroom/bathroom Alternate Level Stairs-Number of Steps: flight Alternate Level Stairs-Rails: Right Bathroom Shower/Tub: Walk-in shower         Home Equipment: Hospital bed;Bedside commode   Additional Comments: plan of for pt to stay downstairs in hospital  bed until she is ready for flight of stairs. They have a close friend that is a HHPT who is planning to work with her and loan them a [place].       Prior Functioning/Environment Level of Independence: Independent        Comments: keeps children in her home        OT Problem List: Decreased strength;Decreased activity tolerance;Impaired balance (sitting and/or standing);Decreased safety awareness;Decreased knowledge of use of DME or AE;Decreased knowledge of precautions;Pain;Impaired UE functional use;Cardiopulmonary status limiting activity      OT Treatment/Interventions: Self-care/ADL training;Therapeutic exercise;Energy conservation;DME and/or AE instruction;Therapeutic activities;Patient/family education;Balance training    OT Goals(Current goals can be found in the care plan section) Acute Rehab OT Goals Patient Stated Goal: return home OT Goal Formulation: With patient/family Time For Goal Achievement: 03/09/17 Potential to Achieve Goals: Good ADL Goals Pt Will Perform Grooming: with supervision;standing Pt Will Perform Upper Body Dressing: with supervision;sitting Pt Will Perform Lower Body Dressing: with supervision;sit to/from stand Pt Will Transfer to Toilet: with supervision;ambulating;bedside commode (BSC over toilet) Pt Will Perform Toileting - Clothing Manipulation and hygiene: with supervision;sit to/from stand  OT Frequency: Min 2X/week   Barriers to D/C:            Co-evaluation PT/OT/SLP Co-Evaluation/Treatment: Yes Reason for Co-Treatment: Complexity of the patient's impairments (multi-system involvement);For patient/therapist safety;To address functional/ADL transfers   OT goals addressed during session: ADL's and self-care      AM-PAC PT "6 Clicks" Daily Activity     Outcome Measure  Help from another person eating meals?: A Little Help from another person taking care of personal grooming?: A Little Help from another person toileting, which includes  using toliet, bedpan, or urinal?: A Lot Help from another person bathing (including washing, rinsing, drying)?: A Lot Help from another person to put on and taking off regular upper body clothing?: A Little Help from another person to put on and taking off regular lower body clothing?: A Lot 6 Click Score: 15   End of Session Equipment Utilized During Treatment: Gait belt Harmon Pier walker) Nurse Communication: Mobility status  Activity Tolerance: Patient tolerated treatment well Patient left: in bed;with call bell/phone within reach  OT Visit Diagnosis: Muscle weakness (generalized) (M62.81);Other abnormalities of gait and mobility (R26.89)                Time: 1005-1050 OT Time Calculation (min): 45 min Charges:  OT General Charges $OT Visit: 1 Visit OT Evaluation $OT Eval Moderate Complexity: 1 Mod G-Codes:     Norman Herrlich, MS OTR/L  Pager: [place] 02/23/2017, 11:08 AM

## 2017-02-23 NOTE — Progress Notes (Signed)
  Amiodarone Drug - Drug Interaction Consult Note  Recommendations: None, pharmacy will continue to follow for drug interactions.  Amiodarone is metabolized by the cytochrome P450 system and therefore has the potential to cause many drug interactions. Amiodarone has an average plasma half-life of 50 days (range 20 to 100 days).   There is potential for drug interactions to occur several weeks or months after stopping treatment and the onset of drug interactions may be slow after initiating amiodarone.   []  Statins: Increased risk of myopathy. Simvastatin- restrict dose to 20mg  daily. Other statins: counsel patients to report any muscle pain or weakness immediately.  []  Anticoagulants: Amiodarone can increase anticoagulant effect. Consider warfarin dose reduction. Patients should be monitored closely and the dose of anticoagulant altered accordingly, remembering that amiodarone levels take several weeks to stabilize.  []  Antiepileptics: Amiodarone can increase plasma concentration of phenytoin, the dose should be reduced. Note that small changes in phenytoin dose can result in large changes in levels. Monitor patient and counsel on signs of toxicity.  [x]  Beta blockers: increased risk of bradycardia, AV block and myocardial depression. Sotalol - avoid concomitant use.  []   Calcium channel blockers (diltiazem and verapamil): increased risk of bradycardia, AV block and myocardial depression.  []   Cyclosporine: Amiodarone increases levels of cyclosporine. Reduced dose of cyclosporine is recommended.  []  Digoxin dose should be halved when amiodarone is started.  []  Diuretics: increased risk of cardiotoxicity if hypokalemia occurs.  []  Oral hypoglycemic agents (glyburide, glipizide, glimepiride): increased risk of hypoglycemia. Patient's glucose levels should be monitored closely when initiating amiodarone therapy.   []  Drugs that prolong the QT interval:  Torsades de pointes risk may be  increased with concurrent use - avoid if possible.  Monitor QTc, also keep magnesium/potassium WNL if concurrent therapy can't be avoided. Marland Kitchen Antibiotics: e.g. fluoroquinolones, erythromycin. . Antiarrhythmics: e.g. quinidine, procainamide, disopyramide, sotalol. . Antipsychotics: e.g. phenothiazines, haloperidol.  . Lithium, tricyclic antidepressants, and methadone.  Thank You,  Deboraha Sprang  02/23/2017 8:49 AM

## 2017-02-23 NOTE — Progress Notes (Signed)
Physical Therapy Treatment Patient Details Name: Stacey [name] MRN: [id number] DOB: 23-Nov-1940 Today's Date: 02/23/2017    History of Present Illness Stacey [name] y.o.femaletransferred emergently from [place] for consideration of cabg .On 02/19/2017 she experienced 2 episodes of substernal chest pressure with associated shortness of breath which lasted approximately 15-20 minutes. In [facility] ED Initial troponin was elevated to 1.2. Follow-up troponins were 4.34 and 6.38. 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 PMH: fibromyalgia, HTN, thyroid disease, PONV.     PT Comments    Pt is making progress towards her goals and is currently modA for bed mobility, and min A for transfers and ambulation of 160 feet with Harmon Pier walker. Pt educated on sternal precautions related to bed mobility and transfers. Pt requires skilled PT to progress mobility and improve strength and endurance to safely navigate their discharge environment.    Follow Up Recommendations  Home health PT;Supervision/Assistance - 24 hour     Equipment Recommendations  Rolling walker with 5" wheels    Recommendations for Other Services OT consult     Precautions / Restrictions Precautions Precautions: Fall;Sternal Precaution Comments: reviewed sternal precautions, pt with appropriate use of pillow Restrictions Weight Bearing Restrictions: No Other Position/Activity Restrictions: chest tube, external pacer, and JP R ankle    Mobility  Bed Mobility Overal bed mobility: Needs Assistance Bed Mobility: Sit to Sidelying;Rolling Rolling: Min assist       Sit to sidelying: Mod assist;+2 for safety/equipment General bed mobility comments: Assist for B LE back into bed.   Transfers Overall transfer level: Needs assistance Equipment used: Rolling walker (2 wheeled) Transfers: Sit to/from Stand Sit to Stand: Min assist;+2 safety/equipment         General transfer comment: Min assist for lifting and steadying. Educated  on sternal precautions and use of pillow to splint during standing.   Ambulation/Gait Ambulation/Gait assistance: Min assist Ambulation Distance (Feet): 160 Feet Assistive device:  (Eva walker) Gait Pattern/deviations: Step-through pattern;Decreased step length - right;Decreased step length - left;Drifts right/left;Shuffle Gait velocity: slowed Gait velocity interpretation: Below normal speed for age/gender General Gait Details: minA for steadying Eva walker with gait, pt with increased focus with gait and requires maximal verbal cuing to perform pursed lipped breathing to increase O2 saturation       Balance Overall balance assessment: Needs assistance Sitting-balance support: No upper extremity supported Sitting balance-Leahy Scale: Fair     Standing balance support: Bilateral upper extremity supported Standing balance-Leahy Scale: Poor Standing balance comment: reliant on UE support from Harmon Pier walker today                            Cognition Arousal/Alertness: Lethargic;Suspect due to medications Behavior During Therapy: Flat affect Overall Cognitive Status: Within Functional Limits for tasks assessed                                 General Comments: Pt becomes fixated with gaze during mobility (per daughter this is hyperfocus) and slow to respond. Pt with decreased follow through when attempting to talk therough breathing techniques during periods of intense focus in hallway.       Exercises      General Comments General comments (skin integrity, edema, etc.): Pt on 5L O2 throughout session. Pt does demonstrate periods of time in which she holds her breath during ambulation with simultaneous SpO2 desaturation. Pt with desat to 78% briefly but  quick return to 95% when educated concerning pursed lip breathing techniques.        Pertinent Vitals/Pain Pain Assessment: Faces Faces Pain Scale: Hurts even more Pain Location: chest and R leg at graft  site Pain Descriptors / Indicators: Aching;Sore;Grimacing Pain Intervention(s): Limited activity within patient's tolerance;Monitored during session;Repositioned    Home Living Family/patient expects to be discharged to:: Private residence Living Arrangements: Spouse/significant other Available Help at Discharge: Family;Available 24 hours/day Type of Home: House Home Access: Stairs to enter Entrance Stairs-Rails: None Home Layout: Two level;1/2 bath on main level;Able to live on main level with bedroom/bathroom Home Equipment: Hospital bed;Bedside commode Additional Comments: plan of for pt to stay downstairs in hospital bed until she is ready for flight of stairs. They have a close friend that is a HHPT who is planning to work with her and loan them a [place].     Prior Function Level of Independence: Independent      Comments: keeps children in her home   PT Goals (current goals can now be found in the care plan section) Acute Rehab PT Goals Patient Stated Goal: return home PT Goal Formulation: With patient Time For Goal Achievement: 03/08/17 Potential to Achieve Goals: Good Progress towards PT goals: Progressing toward goals    Frequency    Min 3X/week      PT Plan Current plan remains appropriate    Co-evaluation PT/OT/SLP Co-Evaluation/Treatment: Yes Reason for Co-Treatment: Complexity of the patient's impairments (multi-system involvement) PT goals addressed during session: Mobility/safety with mobility;Balance OT goals addressed during session: ADL's and self-care      AM-PAC PT "6 Clicks" Daily Activity  Outcome Measure  Difficulty turning over in bed (including adjusting bedclothes, sheets and blankets)?: Unable Difficulty moving from lying on back to sitting on the side of the bed? : Unable Difficulty sitting down on and standing up from a chair with arms (e.g., wheelchair, bedside commode, etc,.)?: Unable Help needed moving to and from a bed to chair  (including a wheelchair)?: A Lot Help needed walking in hospital room?: A Lot Help needed climbing 3-5 steps with a railing? : Total 6 Click Score: 8    End of Session Equipment Utilized During Treatment: Gait belt;Oxygen Activity Tolerance: Patient limited by lethargy Patient left: in bed;with call bell/phone within reach;with family/visitor present;with nursing/sitter in room Nurse Communication: Mobility status PT Visit Diagnosis: Unsteadiness on feet (R26.81);Other abnormalities of gait and mobility (R26.89);Repeated falls (R29.6);Muscle weakness (generalized) (M62.81);Difficulty in walking, not elsewhere classified (R26.2) Pain - Right/Left: Right Pain - part of body: Leg     Time: 1005-1050 PT Time Calculation (min) (ACUTE ONLY): 45 min  Charges:  $Gait Training: 8-22 mins                    G Codes:       Radonna Bracher B. Migdalia Dk PT, DPT Acute Rehabilitation  [phone] Pager [phone]     [place] 02/23/2017, 1:04 PM

## 2017-02-23 NOTE — Op Note (Signed)
[name], [name] NO.:  [id number]  MEDICAL RECORD NO.:  [id number]  LOCATION:  2D78E                        FACILITY:  [place]  PHYSICIAN:  Lanelle Bal, MD    DATE OF BIRTH:  [birth year]  DATE OF PROCEDURE:  02/20/2017 DATE OF DISCHARGE:                              OPERATIVE REPORT   PREOPERATIVE DIAGNOSIS:  Acute myocardial infarction, less than 24 hours.  POSTOPERATIVE DIAGNOSIS:  Acute myocardial infarction, less than 24 hours.  SURGICAL PROCEDURES:  Emergency coronary artery bypass grafting x3 with the left internal mammary to the left anterior descending coronary artery, reversed saphenous vein graft to the diagonal coronary artery, reversed saphenous vein graft to the distal right coronary artery with right greater saphenous endoscopic vein harvesting of the right greater saphenous vein thigh and calf.  SURGEON:  Lanelle Bal, MD.  FIRST ASSISTANT:  Ellwood Handler, PA.  BRIEF HISTORY:  The patient is a 76 year old female with previous history of fibromyalgia, who the day prior presented to [facility] with prolonged chest and neck pain.  The patient had noted the symptoms and initially thought it was her fibromyalgia, but because of persistent pain was admitted to [facility].  Serial troponins gradually rose to 6.5.  The patient continued having substernal discomfort and underwent emergent catheterization by Dr. Saralyn Pilar on the morning of surgery.  At the time of catheterization, she was found to have significant coronary artery disease with a mid right coronary lesion of 95%, complex serial 90% lesions of the LAD involving the diagonal branch.  There was a small intermediate branch that had disease.  Previous echocardiogram had shown normal systolic function with mild to moderate mitral regurgitation.  On catheterization, the patient was noted to have significant calcification in the ascending aorta and also highly  calcified mitral valve anulus. Because of the complex LAD disease that was not amenable to angioplasty or stenting, the patient was referred for emergency coronary artery bypass grafting and was transferred directly from the cath lab at [facility] to the [facility] [place].  Risks and options of surgery were discussed with the patient and with her family and she was willing to proceed. She and her husband signed informed consent.  DESCRIPTION OF PROCEDURE:  With Swan-Ganz and arterial line monitors in place, the patient underwent general endotracheal anesthesia without incident.  Skin of the chest and legs was prepped with Betadine and draped in usual sterile manner.  TEE probe was placed by Dr. Gifford Shave.  This showed depressed anterior wall motion with overall ejection fraction approximately 40%.  There was trace mitral regurgitation.  No aortic regurgitation.  Appropriate time-out was performed, and we proceeded with endoscopic vein harvesting of the right greater saphenous vein from the thigh and calf and obtained 2 suitable pieces of vein for bypass. Median sternotomy was performed.  Left internal mammary artery was dissected down as a pedicle graft.  The distal artery was divided and had excellent free flow.  Pericardium was opened.  There was a moderate slightly bloody pericardial effusion.  On examination, palpation of the aorta along the inner curve of the ascending aorta was a firm calcific plaque.  The proximal aorta just above the aortic valve and  along the right lateral side was normal at the cannulation site __________.  The patient was systemically heparinized.  The ascending aorta was cannulated.  The right atrium was cannulated.  An aortic root vent cardioplegia needle was introduced into the ascending aorta slightly to the outer curve of the aorta to avoid the calcific plaque.  The patient was placed on cardiopulmonary bypass at 2.4 L/min/m2.  Sites of anastomosis were selected and  dissected out of the epicardium.  The patient's body temperature was cooled to 32 degrees.  Aortic crossclamp was applied, and 500 mL of cold blood potassium cardioplegia was administered with diastolic arrest of the heart.  Myocardial septal temperature was monitored throughout the cross clamp.  Attention was turned first to the distal right coronary artery just prior to the bifurcation of the PD.  The vessel was opened with a large-sized vessel and it was of good quality at the site of anastomosis.  Using a running 7-0 Prolene, distal anastomosis was performed.  Additional cold blood cardioplegia was administered down the right vein graft.  The heart was elevated.  This intermediate branch was identified, but it was very small, less than 1 mm and was not felt to be bypassable.  The diagonal branch was also small, but was opened and admitted a 1-mm probe distally.  Using a running 8-0 Prolene, a segment of reversed saphenous vein graft was anastomosed to the diagonal coronary artery.  The distal LAD was then opened and admitted a 1-mm probe distally and proximally. The vessel was approximately 1.3 mm in size.  Using a running 8-0 Prolene, the left internal mammary artery was anastomosed to the left anterior descending coronary artery.  With the crossclamp still in place, 2 punch aortotomies were performed toward the right lateral side of the aorta, avoiding the calcific area along the inner curve.  At the site of the proximal anastomosis, the aorta was of normal plasticity and thickness.  Using running 6-0 Prolene, the proximal anastomoses were created.  The bulldog on the mammary artery was removed with prompt rise in myocardial septal temperature.  The heart was allowed to passively fill and de-air.  The proximal anastomoses were completed and the aortic cross-clamp was removed with total cross-clamp time of 75 minutes.  The patient spontaneously converted to a sinus rhythm.  Sites of  anastomosis were then inspected, were free of bleeding.  The patient was then ventilated and weaned off cardiopulmonary bypass without difficulty. The TEE post pump showed no increase in mitral regurgitation and overall adequate LV function without severe wall motion abnormalities.  The patient was decannulated.  Protamine was administered.  Atrial and ventricular pacing wires had been applied.  Graft markers were applied. Pericardium was loosely reapproximated.  Left pleural tube and Blake mediastinal drain were left in place.  Sternum was closed with #6 stainless steel wire.  Fascia was closed with interrupted 0 Vicryl, running 3-0 Vicryl on subcutaneous tissue, 4-0 subcuticular stitch in skin edges.  Dry dressings were applied.  Sponge and needle count was reported as correct at the completion of procedure.  The patient tolerated the procedure without obvious complication and was transferred to the Surgical Intensive Care Unit for further postoperative care.  A small silastic drain was left in the depths of the right calf endoscopic tunnel on the right leg for drainage.  Because of low hematocrit while on pump, the patient was given packed red blood cells.     Lanelle Bal, MD     EG/MEDQ  D:  02/23/2017  T:  02/23/2017  Job:  [id number]  cc:   Isaias Cowman, MD Serafina Royals, MD

## 2017-02-24 ENCOUNTER — Inpatient Hospital Stay (HOSPITAL_COMMUNITY): Payer: Medicare Other

## 2017-02-24 LAB — BASIC METABOLIC PANEL
Anion gap: 9 (ref 5–15)
BUN: 22 mg/dL — ABNORMAL HIGH (ref 6–20)
CO2: 27 mmol/L (ref 22–32)
Calcium: 8.4 mg/dL — ABNORMAL LOW (ref 8.9–10.3)
Chloride: 98 mmol/L — ABNORMAL LOW (ref 101–111)
Creatinine, Ser: 1.12 mg/dL — ABNORMAL HIGH (ref 0.44–1.00)
GFR calc Af Amer: 55 mL/min — ABNORMAL LOW (ref >60)
GFR calc non Af Amer: 47 mL/min — ABNORMAL LOW (ref >60)
Glucose, Bld: 100 mg/dL — ABNORMAL HIGH (ref 65–99)
Potassium: 4.2 mmol/L (ref 3.5–5.1)
Sodium: 134 mmol/L — ABNORMAL LOW (ref 135–145)

## 2017-02-24 LAB — GLUCOSE, CAPILLARY
Glucose-Capillary: 83 mg/dL (ref 65–99)
Glucose-Capillary: 78 mg/dL (ref 65–99)
Glucose-Capillary: 102 mg/dL — ABNORMAL HIGH (ref 65–99)
Glucose-Capillary: 112 mg/dL — ABNORMAL HIGH (ref 65–99)
Glucose-Capillary: 128 mg/dL — ABNORMAL HIGH (ref 65–99)
Glucose-Capillary: 107 mg/dL — ABNORMAL HIGH (ref 65–99)

## 2017-02-24 LAB — CBC
HCT: 26.8 % — ABNORMAL LOW (ref 36.0–46.0)
Hemoglobin: 8.9 g/dL — ABNORMAL LOW (ref 12.0–15.0)
MCH: 29.5 pg (ref 26.0–34.0)
MCHC: 33.2 g/dL (ref 30.0–36.0)
MCV: 88.7 fL (ref 78.0–100.0)
Platelets: 145 10*3/uL — ABNORMAL LOW (ref 150–400)
RBC: 3.02 MIL/uL — ABNORMAL LOW (ref 3.87–5.11)
RDW: 14.4 % (ref 11.5–15.5)
WBC: 6 10*3/uL (ref 4.0–10.5)

## 2017-02-24 LAB — MAGNESIUM: Magnesium: 2 mg/dL (ref 1.7–2.4)

## 2017-02-24 MED ORDER — FUROSEMIDE 10 MG/ML IJ SOLN
40.0000 mg | Freq: Once | INTRAMUSCULAR | Status: AC
  Administered 2017-02-24: 40 mg via INTRAVENOUS

## 2017-02-24 NOTE — Progress Notes (Signed)
Patient ID: Stacey [name], female   DOB: [birth year], 76 y.o.   MRN: [id number] TCTS evening rounds:  Hemodynamically stable today.  Went back into rate controlled atrial fib sats 97% on 1L [place] Diuresing well Continue present course.

## 2017-02-24 NOTE — Progress Notes (Signed)
Patient ID: Stacey [name], female   DOB: Jun 26, 1940, 76 y.o.   MRN: [id number] TCTS DAILY ICU PROGRESS NOTE                   [address]            [place] [address]          [phone]   4 Days Post-Op Procedure(s) (LRB): CORONARY ARTERY BYPASS GRAFTING (CABG) x , three using left internal mammary artery to left anterior descending coronary artery and right greater saphenous vein harvested endoscopically to distal right and diagonal coronary arteries. (N/A) TRANSESOPHAGEAL ECHOCARDIOGRAM (TEE) (N/A)  Total Length of Stay:  LOS: 4 days   Subjective: Walked in unit   Objective: Vital signs in last 24 hours: Temp:  [97.7 F (36.5 C)-98.3 F (36.8 C)] 97.9 F (36.6 C) (10/03 1120) Pulse Rate:  [61-97] 97 (10/03 0939) Cardiac Rhythm: Normal sinus rhythm (10/03 0800) Resp:  [0-33] 17 (10/03 0939) BP: (88-148)/(57-90) 98/57 (10/03 0900) SpO2:  [93 %-100 %] 100 % (10/03 0939) Weight:  [192 lb 10.9 oz (87.4 kg)] 192 lb 10.9 oz (87.4 kg) (10/03 0445)  Filed Weights   02/22/17 0500 02/23/17 0500 02/24/17 0445  Weight: 190 lb (86.2 kg) 192 lb 8 oz (87.3 kg) 192 lb 10.9 oz (87.4 kg)    Weight change: 2.9 oz (0.082 kg)   Hemodynamic parameters for last 24 hours:    Intake/Output from previous day: 10/02 0701 - 10/03 0700 In: 633.2 [P.O.:360; I.V.:273.2] Out: 2450 [Urine:2450]  Intake/Output this shift: Total I/O In: 240 [P.O.:240] Out: -   Current Meds: Scheduled Meds: . acetaminophen  1,000 mg Oral Q6H   Or  . acetaminophen (TYLENOL) oral liquid 160 mg/5 mL  1,000 mg Per Tube Q6H  . amiodarone  200 mg Oral Q12H   Followed by  . [START ON 03/02/2017] amiodarone  200 mg Oral Daily  . aspirin EC  81 mg Oral Daily   Or  . aspirin  81 mg Per Tube Daily  . bisacodyl  10 mg Oral Daily   Or  . bisacodyl  10 mg Rectal Daily  . buPROPion  150 mg Oral Daily  . chlorhexidine gluconate (MEDLINE KIT)  15 mL Mouth Rinse BID  . docusate sodium  200 mg  Oral Daily  . enoxaparin (LOVENOX) injection  30 mg Subcutaneous Q24H  . escitalopram  10 mg Oral Daily  . fenofibrate  54 mg Oral Daily  . folic acid  1 mg Oral Daily  . furosemide  40 mg Intravenous Once  . insulin aspart  0-24 Units Subcutaneous TID AC & HS  . levalbuterol  0.63 mg Nebulization TID  . levothyroxine  88 mcg Oral QAC breakfast  . metoprolol tartrate  12.5 mg Oral BID   Or  . metoprolol tartrate  12.5 mg Per Tube BID  . pantoprazole  40 mg Oral Daily  . sodium chloride flush  3 mL Intravenous Q12H   Continuous Infusions: . lactated ringers 20 mL/hr at 02/23/17 1100   PRN Meds:.metoprolol tartrate, ondansetron (ZOFRAN) IV, oxyCODONE, sodium chloride flush, traMADol  General appearance: alert and cooperative Neurologic: intact Heart: irregularly irregular rhythm Lungs: diminished breath sounds bibasilar Abdomen: soft, non-tender; bowel sounds normal; no masses,  no organomegaly Extremities: extremities normal, atraumatic, no cyanosis or edema and bruising at site of vein harvest  Wound: sternum intact  Lab Results: CBC: Recent Labs  02/23/17 0301 02/24/17 0525  WBC 6.6 6.0  HGB 8.4* 8.9*  HCT 25.9* 26.8*  PLT 116* 145*   BMET:  Recent Labs  02/23/17 0301 02/24/17 0525  NA 131* 134*  K 4.9 4.2  CL 100* 98*  CO2 24 27  GLUCOSE 116* 100*  BUN 27* 22*  CREATININE 1.41* 1.12*  CALCIUM 8.2* 8.4*    CMET: Lab Results  Component Value Date   WBC 6.0 02/24/2017   HGB 8.9 (L) 02/24/2017   HCT 26.8 (L) 02/24/2017   PLT 145 (L) 02/24/2017   GLUCOSE 100 (H) 02/24/2017   CHOL 183 02/20/2017   TRIG 96 02/20/2017   HDL 58 02/20/2017   LDLCALC 106 (H) 02/20/2017   ALT 12 12/18/2016   AST 16 12/18/2016   NA 134 (L) 02/24/2017   K 4.2 02/24/2017   CL 98 (L) 02/24/2017   CREATININE 1.12 (H) 02/24/2017   BUN 22 (H) 02/24/2017   CO2 27 02/24/2017   TSH 1.675 02/22/2017   INR 1.38 02/20/2017   HGBA1C 5.4 12/18/2016      PT/INR: No results for  input(s): LABPROT, INR in the last 72 hours. Radiology: Dg Chest Port 1 View  Result Date: 02/24/2017 CLINICAL DATA:  Status post CABG on February 20, 2017. History of asthma. EXAM: PORTABLE CHEST 1 VIEW COMPARISON:  Chest x-ray of February 23, 2017. FINDINGS: The lungs are adequately inflated. The interstitial markings are coarse bilaterally. The cardiac silhouette is enlarged and the pulmonary vascularity is engorged. There is calcification in the wall of the aortic arch. The right internal jugular Cordis sheath has been removed as has the left chest tube. The sternal wires are intact. IMPRESSION: CHF with mild interstitial edema. No significant pleural effusion and no pneumothorax. There has not been significant interval change since the previous study. Thoracic aortic atherosclerosis. Electronically Signed   By: David  Martinique M.D.   On: 02/24/2017 07:46     Assessment/Plan: S/P Procedure(s) (LRB): CORONARY ARTERY BYPASS GRAFTING (CABG) x , three using left internal mammary artery to left anterior descending coronary artery and right greater saphenous vein harvested endoscopically to distal right and diagonal coronary arteries. (N/A) TRANSESOPHAGEAL ECHOCARDIOGRAM (TEE) (N/A) Mobilize Diuresis Diabetes control Progressing, but now back in a fib with controlled rate, if does not convert will start on anticoagulation     Grace Isaac 02/24/2017 12:25 PM

## 2017-02-25 ENCOUNTER — Inpatient Hospital Stay (HOSPITAL_COMMUNITY): Payer: Medicare Other

## 2017-02-25 DIAGNOSIS — Z951 Presence of aortocoronary bypass graft: Secondary | ICD-10-CM

## 2017-02-25 DIAGNOSIS — I480 Paroxysmal atrial fibrillation: Secondary | ICD-10-CM

## 2017-02-25 LAB — BASIC METABOLIC PANEL
Anion gap: 9 (ref 5–15)
Anion gap: 10 (ref 5–15)
BUN: 22 mg/dL — ABNORMAL HIGH (ref 6–20)
BUN: 16 mg/dL (ref 6–20)
CO2: 28 mmol/L (ref 22–32)
CO2: 28 mmol/L (ref 22–32)
Calcium: 8.3 mg/dL — ABNORMAL LOW (ref 8.9–10.3)
Calcium: 8.6 mg/dL — ABNORMAL LOW (ref 8.9–10.3)
Chloride: 98 mmol/L — ABNORMAL LOW (ref 101–111)
Chloride: 98 mmol/L — ABNORMAL LOW (ref 101–111)
Creatinine, Ser: 1.12 mg/dL — ABNORMAL HIGH (ref 0.44–1.00)
Creatinine, Ser: 1 mg/dL (ref 0.44–1.00)
GFR calc Af Amer: 55 mL/min — ABNORMAL LOW (ref >60)
GFR calc Af Amer: >60 mL/min (ref >60)
GFR calc non Af Amer: 47 mL/min — ABNORMAL LOW (ref >60)
GFR calc non Af Amer: 54 mL/min — ABNORMAL LOW (ref >60)
Glucose, Bld: 115 mg/dL — ABNORMAL HIGH (ref 65–99)
Glucose, Bld: 115 mg/dL — ABNORMAL HIGH (ref 65–99)
Potassium: 3.9 mmol/L (ref 3.5–5.1)
Potassium: 4.3 mmol/L (ref 3.5–5.1)
Sodium: 135 mmol/L (ref 135–145)
Sodium: 136 mmol/L (ref 135–145)

## 2017-02-25 LAB — CBC
HCT: 28.5 % — ABNORMAL LOW (ref 36.0–46.0)
Hemoglobin: 9 g/dL — ABNORMAL LOW (ref 12.0–15.0)
MCH: 28 pg (ref 26.0–34.0)
MCHC: 31.6 g/dL (ref 30.0–36.0)
MCV: 88.5 fL (ref 78.0–100.0)
Platelets: 203 10*3/uL (ref 150–400)
RBC: 3.22 MIL/uL — ABNORMAL LOW (ref 3.87–5.11)
RDW: 14.3 % (ref 11.5–15.5)
WBC: 5.9 10*3/uL (ref 4.0–10.5)

## 2017-02-25 LAB — GLUCOSE, CAPILLARY
Glucose-Capillary: 98 mg/dL (ref 65–99)
Glucose-Capillary: 110 mg/dL — ABNORMAL HIGH (ref 65–99)
Glucose-Capillary: 96 mg/dL (ref 65–99)

## 2017-02-25 LAB — PROTIME-INR
INR: 1.1
Prothrombin Time: 14.1 seconds (ref 11.4–15.2)

## 2017-02-25 MED ORDER — TRAMADOL HCL 50 MG PO TABS
50.0000 mg | ORAL_TABLET | Freq: Four times a day (QID) | ORAL | Status: DC | PRN
  Administered 2017-02-25 – 2017-02-27 (×4): 50 mg via ORAL
  Filled 2017-02-25 (×4): qty 1

## 2017-02-25 MED ORDER — WARFARIN - PHYSICIAN DOSING INPATIENT
Freq: Every day | Status: DC
  Administered 2017-02-25: 17:00:00

## 2017-02-25 MED ORDER — SODIUM CHLORIDE 0.9% FLUSH: 3.0000 mL | INTRAVENOUS | Status: DC | PRN

## 2017-02-25 MED ORDER — ALUM & MAG HYDROXIDE-SIMETH 200-200-20 MG/5ML PO SUSP: 15.0000 mL | ORAL | Status: DC | PRN

## 2017-02-25 MED ORDER — SODIUM CHLORIDE 0.9% FLUSH
3.0000 mL | Freq: Two times a day (BID) | INTRAVENOUS | Status: DC
  Administered 2017-02-25 – 2017-02-28 (×5): 3 mL via INTRAVENOUS

## 2017-02-25 MED ORDER — PANTOPRAZOLE SODIUM 40 MG PO TBEC
40.0000 mg | DELAYED_RELEASE_TABLET | Freq: Every day | ORAL | Status: DC
  Administered 2017-02-26 – 2017-02-28 (×3): 40 mg via ORAL
  Filled 2017-02-25 (×3): qty 1

## 2017-02-25 MED ORDER — ONDANSETRON HCL 4 MG PO TABS
4.0000 mg | ORAL_TABLET | Freq: Four times a day (QID) | ORAL | Status: DC | PRN
  Filled 2017-02-25: qty 1

## 2017-02-25 MED ORDER — MOVING RIGHT ALONG BOOK
Freq: Once | Status: AC
  Administered 2017-02-25: 18:00:00
  Filled 2017-02-25: qty 1

## 2017-02-25 MED ORDER — ASPIRIN EC 81 MG PO TBEC
81.0000 mg | DELAYED_RELEASE_TABLET | Freq: Every day | ORAL | Status: DC
  Administered 2017-02-26 – 2017-02-28 (×3): 81 mg via ORAL
  Filled 2017-02-25 (×3): qty 1

## 2017-02-25 MED ORDER — BISACODYL 5 MG PO TBEC: 10.0000 mg | DELAYED_RELEASE_TABLET | Freq: Every day | ORAL | Status: DC | PRN

## 2017-02-25 MED ORDER — PATIENT'S GUIDE TO USING COUMADIN BOOK
Freq: Once | Status: AC
  Administered 2017-02-25: 10:00:00
  Filled 2017-02-25: qty 1

## 2017-02-25 MED ORDER — BISACODYL 10 MG RE SUPP
10.0000 mg | Freq: Every day | RECTAL | Status: DC | PRN
  Filled 2017-02-25: qty 1

## 2017-02-25 MED ORDER — OXYCODONE HCL 5 MG PO TABS
5.0000 mg | ORAL_TABLET | ORAL | Status: DC | PRN
  Administered 2017-02-25 – 2017-02-27 (×3): 5 mg via ORAL
  Filled 2017-02-25 (×4): qty 1

## 2017-02-25 MED ORDER — SODIUM CHLORIDE 0.9 % IV SOLN: 250.0000 mL | INTRAVENOUS | Status: DC | PRN

## 2017-02-25 MED ORDER — WARFARIN SODIUM 5 MG PO TABS
5.0000 mg | ORAL_TABLET | Freq: Every day | ORAL | Status: DC
  Administered 2017-02-25: 5 mg via ORAL
  Filled 2017-02-25: qty 1

## 2017-02-25 MED ORDER — METOPROLOL TARTRATE 12.5 MG HALF TABLET
12.5000 mg | ORAL_TABLET | Freq: Two times a day (BID) | ORAL | Status: DC
  Administered 2017-02-25 – 2017-02-26 (×2): 12.5 mg via ORAL
  Filled 2017-02-25 (×2): qty 1

## 2017-02-25 MED ORDER — ONDANSETRON HCL 4 MG/2ML IJ SOLN: 4.0000 mg | Freq: Four times a day (QID) | INTRAMUSCULAR | Status: DC | PRN

## 2017-02-25 MED ORDER — APIXABAN 5 MG PO TABS
5.0000 mg | ORAL_TABLET | Freq: Two times a day (BID) | ORAL | Status: DC
  Administered 2017-02-26 – 2017-02-28 (×5): 5 mg via ORAL
  Filled 2017-02-25 (×6): qty 1

## 2017-02-25 MED ORDER — ACETAMINOPHEN 325 MG PO TABS
650.0000 mg | ORAL_TABLET | Freq: Four times a day (QID) | ORAL | Status: DC | PRN
  Administered 2017-02-25 – 2017-02-27 (×2): 650 mg via ORAL
  Filled 2017-02-25 (×2): qty 2

## 2017-02-25 MED ORDER — INSULIN ASPART 100 UNIT/ML ~~LOC~~ SOLN
0.0000 [IU] | Freq: Three times a day (TID) | SUBCUTANEOUS | Status: DC
  Administered 2017-02-25: 2 [IU] via SUBCUTANEOUS

## 2017-02-25 NOTE — Progress Notes (Signed)
Patient ID: Stacey [name], female   DOB: 10/11/1940, 76 y.o.   MRN: [id number] TCTS DAILY ICU PROGRESS NOTE                   [address]            [place] [address]          [phone]   5 Days Post-Op Procedure(s) (LRB): CORONARY ARTERY BYPASS GRAFTING (CABG) x , three using left internal mammary artery to left anterior descending coronary artery and right greater saphenous vein harvested endoscopically to distal right and diagonal coronary arteries. (N/A) TRANSESOPHAGEAL ECHOCARDIOGRAM (TEE) (N/A)  Total Length of Stay:  LOS: 5 days   Subjective: Walked in hall with help today   Objective: Vital signs in last 24 hours: Temp:  [97.9 F (36.6 C)-98.6 F (37 C)] 98.5 F (36.9 C) (10/04 0800) Pulse Rate:  [69-112] 69 (10/04 0800) Cardiac Rhythm: Atrial fibrillation (10/04 0800) Resp:  [15-40] 40 (10/04 0900) BP: (87-145)/(56-82) 145/81 (10/04 0900) SpO2:  [91 %-99 %] 95 % (10/04 0800) FiO2 (%):  [24 %] 24 % (10/03 1411) Weight:  [181 lb 3.5 oz (82.2 kg)] 181 lb 3.5 oz (82.2 kg) (10/04 0500)  Filed Weights   02/23/17 0500 02/24/17 0445 02/25/17 0500  Weight: 192 lb 8 oz (87.3 kg) 192 lb 10.9 oz (87.4 kg) 181 lb 3.5 oz (82.2 kg)    Weight change: -11 lb 7.4 oz (-5.2 kg)   Hemodynamic parameters for last 24 hours:    Intake/Output from previous day: 10/03 0701 - 10/04 0700 In: 720 [P.O.:720] Out: 1850 [Urine:1850]  Intake/Output this shift: Total I/O In: 240 [P.O.:240] Out: -   Current Meds: Scheduled Meds: . acetaminophen  1,000 mg Oral Q6H   Or  . acetaminophen (TYLENOL) oral liquid 160 mg/5 mL  1,000 mg Per Tube Q6H  . amiodarone  200 mg Oral Q12H   Followed by  . [START ON 03/02/2017] amiodarone  200 mg Oral Daily  . aspirin EC  81 mg Oral Daily   Or  . aspirin  81 mg Per Tube Daily  . bisacodyl  10 mg Oral Daily   Or  . bisacodyl  10 mg Rectal Daily  . buPROPion  150 mg Oral Daily  . chlorhexidine gluconate (MEDLINE KIT)  15  mL Mouth Rinse BID  . docusate sodium  200 mg Oral Daily  . enoxaparin (LOVENOX) injection  30 mg Subcutaneous Q24H  . escitalopram  10 mg Oral Daily  . fenofibrate  54 mg Oral Daily  . folic acid  1 mg Oral Daily  . insulin aspart  0-24 Units Subcutaneous TID AC & HS  . levalbuterol  0.63 mg Nebulization TID  . levothyroxine  88 mcg Oral QAC breakfast  . metoprolol tartrate  12.5 mg Oral BID   Or  . metoprolol tartrate  12.5 mg Per Tube BID  . pantoprazole  40 mg Oral Daily  . sodium chloride flush  3 mL Intravenous Q12H   Continuous Infusions: . lactated ringers 20 mL/hr at 02/23/17 1100   PRN Meds:.metoprolol tartrate, ondansetron (ZOFRAN) IV, oxyCODONE, sodium chloride flush, traMADol  General appearance: alert and cooperative Neurologic: intact Heart: irregularly irregular rhythm Lungs: diminished breath sounds bibasilar Abdomen: soft, non-tender; bowel sounds normal; no masses,  no organomegaly Extremities: bruising left leg vein harvest  Wound: sternum intact  Lab Results: CBC: Recent Labs  02/24/17 0525 02/25/17 0317  WBC 6.0 5.9  HGB 8.9*  9.0*  HCT 26.8* 28.5*  PLT 145* 203   BMET:  Recent Labs  02/24/17 0525 02/25/17 0317  NA 134* 135  K 4.2 3.9  CL 98* 98*  CO2 27 28  GLUCOSE 100* 115*  BUN 22* 22*  CREATININE 1.12* 1.12*  CALCIUM 8.4* 8.3*    CMET: Lab Results  Component Value Date   WBC 5.9 02/25/2017   HGB 9.0 (L) 02/25/2017   HCT 28.5 (L) 02/25/2017   PLT 203 02/25/2017   GLUCOSE 115 (H) 02/25/2017   CHOL 183 02/20/2017   TRIG 96 02/20/2017   HDL 58 02/20/2017   LDLCALC 106 (H) 02/20/2017   ALT 12 12/18/2016   AST 16 12/18/2016   NA 135 02/25/2017   K 3.9 02/25/2017   CL 98 (L) 02/25/2017   CREATININE 1.12 (H) 02/25/2017   BUN 22 (H) 02/25/2017   CO2 28 02/25/2017   TSH 1.675 02/22/2017   INR 1.10 02/25/2017   HGBA1C 5.4 12/18/2016      PT/INR:  Recent Labs  02/25/17 0317  LABPROT 14.1  INR 1.10   Radiology: Dg  Chest Port 1 View  Result Date: 02/25/2017 CLINICAL DATA:  Status post CABG on February 20, 2017 EXAM: PORTABLE CHEST 1 VIEW COMPARISON:  Portable chest x-ray of February 24, 2017 FINDINGS: The lungs are reasonably well inflated. The interstitial markings have improved. No more than a trace of pleural fluid remains bilaterally. There is persistent left lower lobe atelectasis. The cardiac silhouette remains enlarged. The pulmonary vascularity is nearly normal. The sternal wires are intact. There is calcification in the wall of the aortic arch. IMPRESSION: Further interval improvement in the appearance of the pulmonary interstitium with decreased interstitial edema. Tiny bilateral pleural effusions persist as well as mild left basilar atelectasis. Electronically Signed   By: David  Martinique M.D.   On: 02/25/2017 07:35     Assessment/Plan: S/P Procedure(s) (LRB): CORONARY ARTERY BYPASS GRAFTING (CABG) x , three using left internal mammary artery to left anterior descending coronary artery and right greater saphenous vein harvested endoscopically to distal right and diagonal coronary arteries. (N/A) TRANSESOPHAGEAL ECHOCARDIOGRAM (TEE) (N/A) Mobilize Diuresis Plan for transfer to step-down: see transfer orders Intermittent AFib post op, on beta blocker and amniodrone , rate controlled but now staying in afib this am  will start coumadin , cardioclogy to see     Grace Isaac 02/25/2017 9:46 AM

## 2017-02-25 NOTE — Consult Note (Signed)
Cardiology Consult    Patient ID: Stacey [name] MRN: [id number], DOB/AGE: 74-27-1942   Admit date: 02/20/2017 Date of Consult: 02/25/2017  Primary Physician: [name], MD Primary Cardiologist: Paras[place] Requesting Provider: Servando Snare Reason for Consultation: post op Afib  Stacey [name] is a 76 y.o. female who is being seen today for the evaluation of post op Afib at the request of Dr. Servando Snare.   Patient Profile    76 yo female with PMH of HTN, fibromyalgia, depression and CAD s/p 3v bypass on 02/20/17 by Dr. Servando Snare who developed post op Afib.   Past Medical History   Past Medical History:  Diagnosis Date  . Arthritis   . Asthma   . Cancer ([place])   . Complication of anesthesia   . Depression   . Family history of adverse reaction to anesthesia    most of family - PONV  . Fibromyalgia   . Glaucoma   . Headache    migraines - 1-2x/mo  . Hypertension   . Motion sickness    all moving vehicles  . PONV (postoperative nausea and vomiting)   . Thyroid disease     Past Surgical History:  Procedure Laterality Date  . ABDOMINAL HYSTERECTOMY    . APPENDECTOMY    . BLADDER SURGERY    . CATARACT EXTRACTION W/ INTRAOCULAR LENS  IMPLANT, BILATERAL    . COLONOSCOPY WITH PROPOFOL N/A 01/17/2016   Procedure: COLONOSCOPY WITH PROPOFOL;  Surgeon: [name], MD;  Location: [place];  Service: Endoscopy;  Laterality: N/A;  . CORONARY ARTERY BYPASS GRAFT N/A 02/20/2017   Procedure: CORONARY ARTERY BYPASS GRAFTING (CABG) x , three using left internal mammary artery to left anterior descending coronary artery and right greater saphenous vein harvested endoscopically to distal right and diagonal coronary arteries.;  Surgeon: Grace Isaac, MD;  Location: [place];  Service: Open Heart Surgery;  Laterality: N/A;  . LEFT HEART CATH AND CORONARY ANGIOGRAPHY N/A 02/20/2017   Procedure: LEFT HEART CATH AND CORONARY ANGIOGRAPHY;  Surgeon: Isaias Cowman,  MD;  Location: [facility];  Service: Cardiovascular;  Laterality: N/A;  . NASAL SINUS SURGERY    . POLYPECTOMY  01/17/2016   Procedure: POLYPECTOMY;  Surgeon: [name], MD;  Location: [place];  Service: Endoscopy;;  . RIGHT OOPHORECTOMY    . TEE WITHOUT CARDIOVERSION N/A 02/20/2017   Procedure: TRANSESOPHAGEAL ECHOCARDIOGRAM (TEE);  Surgeon: Grace Isaac, MD;  Location: [place];  Service: Open Heart Surgery;  Laterality: N/A;     Allergies  Allergies  Allergen Reactions  . Cephalexin Anaphylaxis  . Atorvastatin   . Morphine And Related Hives  . Novocain [Procaine]     Chest pain  . Shellfish Allergy Swelling    angioedema  . Statins Other (See Comments)    Muscle pain  . Tomato     (by testing) - also beans, wheat, peas  . Penicillins Hives and Rash    History of Present Illness    Mrs. Stacey [name] is a 76 yo female with no previous PMH of CAD who presented to [facility] with chest pain. Ruled in with trop peaking at 6.38. Underwent cardiac cath showing complex multivessel disease with 95% p/mLAD, 80% stenosis of the proximal ramus intermediate branch, and 99% pRCA. LV function was reported as mildly reduced with anteroapical and inferoapical hypokinesis. She was sent to [facility] for urgent CABG with Dr. Servando Snare with successful 3v (LIMA to LAD, SVG to Diag, SVG to [facility]). Developed  post op Afib and placed on IV amiodarone and converted back to SR. Placed on PO amiodarone '200mg'$  BID. Converted back into Afib on 02/24/17 and placed on metoprolol 12.'5mg'$  BID. Has remained in Afib, rate controlled, and now planning to start [facility] with coumadin today.   Inpatient Medications    . acetaminophen  1,000 mg Oral Q6H   Or  . acetaminophen (TYLENOL) oral liquid 160 mg/5 mL  1,000 mg Per Tube Q6H  . amiodarone  200 mg Oral Q12H   Followed by  . [START ON 03/02/2017] amiodarone  200 mg Oral Daily  . aspirin EC  81 mg Oral Daily   Or  . aspirin  81 mg Per Tube Daily  . bisacodyl   10 mg Oral Daily   Or  . bisacodyl  10 mg Rectal Daily  . buPROPion  150 mg Oral Daily  . chlorhexidine gluconate (MEDLINE KIT)  15 mL Mouth Rinse BID  . docusate sodium  200 mg Oral Daily  . enoxaparin (LOVENOX) injection  30 mg Subcutaneous Q24H  . escitalopram  10 mg Oral Daily  . fenofibrate  54 mg Oral Daily  . folic acid  1 mg Oral Daily  . insulin aspart  0-24 Units Subcutaneous TID AC & HS  . levalbuterol  0.63 mg Nebulization TID  . levothyroxine  88 mcg Oral QAC breakfast  . metoprolol tartrate  12.5 mg Oral BID   Or  . metoprolol tartrate  12.5 mg Per Tube BID  . pantoprazole  40 mg Oral Daily  . sodium chloride flush  3 mL Intravenous Q12H  . warfarin  5 mg Oral Daily  . Warfarin - Physician Dosing Inpatient   Does not apply q21    Family History    Family History  Problem Relation Age of Onset  . Alzheimer's disease Mother   . Kidney cancer Mother   . Heart attack Father   . Pancreatic cancer Sister   . Heart attack Brother 96  . Ovarian cancer Sister   . Kidney disease Sister        dialysis  . Cancer Sister        uterin  . Diabetes Sister   . Heart attack Sister   . Heart attack Brother 29    Social History    Social History   Social History  . Marital status: Married    Spouse name: Stacey [name]  . Number of children: 3  . Years of education: N/A   Occupational History  .  [employer]   Social History Main Topics  . Smoking status: Former Smoker    Quit date: 05/26/1975  . Smokeless tobacco: Never Used     Comment: quit 45  years ago   . Alcohol use No  . Drug use: No  . Sexual activity: No   Other Topics Concern  . Not on file   Social History Narrative  . No narrative on file     Review of Systems    See HPI  All other systems reviewed and are otherwise negative except as noted above.  Physical Exam    Blood pressure 114/67, pulse 93, temperature 98.1 F (36.7 C), temperature source Oral, resp. rate 15, height '5\' 2"'$   (1.575 m), weight 181 lb 3.5 oz (82.2 kg), SpO2 97 %.  General: Pleasant, older WF, NAD Psych: Normal affect. Neuro: Alert and oriented X 3. Moves all extremities spontaneously. HEENT: Normal  Neck: Supple without bruits or JVD. Lungs:  Resp regular and unlabored, CTA. Heart: Irreg Irreg, no s3, s4, or murmurs, healing sternotomy  Abdomen: Soft, non-tender, non-distended, BS + x 4.  Extremities: No clubbing, cyanosis, RL edema with sutures intact. DP/PT/Radials 2+ and equal bilaterally.  Labs    Troponin (Point of Care Test) No results for input(s): TROPIPOC in the last 72 hours. No results for input(s): CKTOTAL, CKMB, TROPONINI in the last 72 hours. Lab Results  Component Value Date   WBC 5.9 02/25/2017   HGB 9.0 (L) 02/25/2017   HCT 28.5 (L) 02/25/2017   MCV 88.5 02/25/2017   PLT 203 02/25/2017    Recent Labs Lab 02/25/17 0317  NA 135  K 3.9  CL 98*  CO2 28  BUN 22*  CREATININE 1.12*  CALCIUM 8.3*  GLUCOSE 115*   Lab Results  Component Value Date   CHOL 183 02/20/2017   HDL 58 02/20/2017   LDLCALC 106 (H) 02/20/2017   TRIG 96 02/20/2017   No results found for: [facility]   Radiology Studies    Dg Chest 2 View  Result Date: 02/19/2017 CLINICAL DATA:  Chest pain EXAM: CHEST  2 VIEW COMPARISON:  04/20/2014 FINDINGS: Hyperinflation. Mild cardiomegaly with aortic atherosclerosis. Moderate diffuse interstitial opacity possibly due to edema. Patchy more focal opacities at the lung bases could reflect superimposed pneumonia. No pneumothorax. IMPRESSION: 1. Moderate diffuse coarse interstitial opacity, could reflect mild underlying pulmonary edema ; patchy more confluent opacities at the lung bases may reflect superimposed pneumonia 2. Cardiomegaly with mild central congestion Electronically Signed   By: Jasmine Pang M.D.   On: 02/19/2017 20:37   Dg Cervical Spine 2 Or 3 Views  Result Date: 02/19/2017 CLINICAL DATA:  Fall with neck pain EXAM: CERVICAL SPINE - 2-3 VIEW  COMPARISON:  Cervical spine radiograph 05/10/2014 FINDINGS: There is narrowing of the intervertebral disc spaces of the lower cervical spine that is unchanged from the prior study. There is no fracture. Alignment is normal. IMPRESSION: Unchanged lower cervical degenerative disc disease compared to 05/10/2014. No acute abnormality. Electronically Signed   By: Deatra Robinson M.D.   On: 02/19/2017 16:02   Dg Thoracic Spine W/swimmers  Result Date: 02/19/2017 CLINICAL DATA:  Fall with back pain EXAM: THORACIC SPINE - 3 VIEWS COMPARISON:  None. FINDINGS: There is no evidence of thoracic spine fracture. Alignment is normal. No other significant bone abnormalities are identified. There is calcific aortic atherosclerosis. IMPRESSION: No fracture or listhesis of the thoracic spine. Electronically Signed   By: Deatra Robinson M.D.   On: 02/19/2017 16:00   Ct Angio Chest Pe W And/or Wo Contrast  Result Date: 02/19/2017 CLINICAL DATA:  76 y/o F; Left-sided chest pain radiating into back and down left arm. EXAM: CT ANGIOGRAPHY CHEST WITH CONTRAST TECHNIQUE: Multidetector CT imaging of the chest was performed using the standard protocol during bolus administration of intravenous contrast. Multiplanar CT image reconstructions and MIPs were obtained to evaluate the vascular anatomy. CONTRAST:  75 cc Isovue 370 COMPARISON:  06/08/2008 CT chest FINDINGS: Cardiovascular: Mild cardiomegaly. Dense mitral annular calcification. Mild coronary artery calcification. No pericardial effusion. Normal caliber thoracic aorta with moderate calcific atherosclerosis. Enlarged main pulmonary artery measuring 4.0 cm, progressed from 2010. Satisfactory opacification of the main pulmonary artery. No pulmonary embolus identified. Mediastinum/Nodes: No enlarged mediastinal, hilar, or axillary lymph nodes. Thyroid gland, trachea, and esophagus demonstrate no significant findings. Lungs/Pleura: Smooth interlobular septal thickening and hazy  ground-glass opacities greatest in lung bases is likely to represent interstitial and mild alveolar pulmonary  edema. There is peribronchial thickening diffusely. No pleural effusion. Upper Abdomen: No acute abnormality. Musculoskeletal: No chest wall abnormality. No acute or significant osseous findings. Chronic right tenth posterior rib fracture. Review of the MIP images confirms the above findings. IMPRESSION: 1. No pulmonary embolus identified. 2. Mild cardiomegaly. Dense mitral annular calcification. Mild coronary artery calcification. 3. 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 4. Interstitial and mild alveolar pulmonary edema. Electronically Signed   By: Kristine Garbe M.D.   On: 02/19/2017 23:09   Dg Chest Port 1 View  Result Date: 02/25/2017 CLINICAL DATA:  Status post CABG on February 20, 2017 EXAM: PORTABLE CHEST 1 VIEW COMPARISON:  Portable chest x-ray of February 24, 2017 FINDINGS: The lungs are reasonably well inflated. The interstitial markings have improved. No more than a trace of pleural fluid remains bilaterally. There is persistent left lower lobe atelectasis. The cardiac silhouette remains enlarged. The pulmonary vascularity is nearly normal. The sternal wires are intact. There is calcification in the wall of the aortic arch. IMPRESSION: Further interval improvement in the appearance of the pulmonary interstitium with decreased interstitial edema. Tiny bilateral pleural effusions persist as well as mild left basilar atelectasis. Electronically Signed   By: David  Martinique M.D.   On: 02/25/2017 07:35   Dg Chest Port 1 View  Result Date: 02/24/2017 CLINICAL DATA:  Status post CABG on February 20, 2017. History of asthma. EXAM: PORTABLE CHEST 1 VIEW COMPARISON:  Chest x-ray of February 23, 2017. FINDINGS: The lungs are adequately inflated. The interstitial markings are coarse bilaterally. The cardiac  silhouette is enlarged and the pulmonary vascularity is engorged. There is calcification in the wall of the aortic arch. The right internal jugular Cordis sheath has been removed as has the left chest tube. The sternal wires are intact. IMPRESSION: CHF with mild interstitial edema. No significant pleural effusion and no pneumothorax. There has not been significant interval change since the previous study. Thoracic aortic atherosclerosis. Electronically Signed   By: David  Martinique M.D.   On: 02/24/2017 07:46   Dg Chest Port 1 View  Result Date: 02/23/2017 CLINICAL DATA:  Recent coronary artery bypass grafting. Chest tube in place EXAM: PORTABLE CHEST 1 VIEW COMPARISON:  February 22, 2017 FINDINGS: There is a chest tube on the left without evident pneumothorax. Cordis tip is in superior vena cava. Temporary pacemaker leads are attached to the right heart. There is cardiomegaly with mild pulmonary venous hypertension. There is atelectatic change in the mid and lower lung zones without frank consolidation. There is aortic atherosclerosis. There is no appreciable edema. No evident adenopathy. No bone lesions. IMPRESSION: Tube and catheter positions as described without evident pneumothorax. Mid and lower lung zone atelectasis without frank consolidation. Underlying pulmonary vascular congestion evident. There is aortic atherosclerosis. Aortic Atherosclerosis (ICD10-I70.0). Electronically Signed   By: Lowella Grip III M.D.   On: 02/23/2017 07:40   Dg Chest Port 1 View  Result Date: 02/22/2017 CLINICAL DATA:  Status post CABG EXAM: PORTABLE CHEST 1 VIEW COMPARISON:  Yesterday FINDINGS: Right IJ sheath and left-sided thoracic drain remain. Unchanged cardiomegaly and aortic tortuosity. Hazy and streaky opacities at the bases. No Kerley lines or pneumothorax. IMPRESSION: Stable atelectasis.  No visible pneumothorax. Electronically Signed   By: Monte Fantasia M.D.   On: 02/22/2017 07:36   Dg Chest Port 1  View  Result Date: 02/21/2017 CLINICAL DATA:  Status post CABG. EXAM: PORTABLE CHEST 1 VIEW COMPARISON:  02/21/2015 FINDINGS: Endotracheal tube and NG tube have been removed. Mediastinal tube, left thoracostomy tube and right IJ central venous catheter sheath again noted. Cardiomegaly and CABG changes again identified. Bibasilar atelectasis noted, improved at the left lung base. There is no evidence of pneumothorax. No other interval change identified. IMPRESSION: Endotracheal tube and NG tube removal with slightly improved left basilar aeration. No evidence of pneumothorax. Electronically Signed   By: Margarette Canada M.D.   On: 02/21/2017 07:34   Dg Chest Port 1 View  Result Date: 02/20/2017 CLINICAL DATA:  Status post coronary artery bypass graft. EXAM: PORTABLE CHEST 1 VIEW COMPARISON:  Radiographs of February 19, 2017. FINDINGS: Stable cardiomediastinal silhouette. Atherosclerosis of thoracic aorta is noted. Endotracheal tube is seen projected over tracheal air shadow with distal tip 2 cm above the carina. Nasogastric tube is seen entering proximal stomach. Left-sided chest tube is noted without pneumothorax. Bibasilar atelectasis is noted. Right internal jugular venous sheath is noted. Bony thorax is unremarkable. IMPRESSION: Endotracheal and nasogastric tubes in grossly good position. Left-sided chest tube is noted without pneumothorax. Mild bibasilar atelectasis is noted. Electronically Signed   By: Marijo Conception, M.D.   On: 02/20/2017 20:52    ECG & Cardiac Imaging    EKG: 02/22/17 Afib Rate 108  Assessment & Plan    76 yo female with PMH of HTN, fibromyalgia, depression and CAD s/p 3v bypass on 02/20/17 by Dr. Servando Snare who developed post op Afib.   1. Afib: Has been in and out of Afib since 10/1 but more recently ha sustained for at least the past 24 hours. Rate is generally controlled in the 80-90s while at rest, and low 100s with activity. Had increased edema on CXR but responded well to IV  lasix with 1.8L UOP yesterday. Currently on metoprolol 12.'5mg'$  BID and amiodarone '200mg'$  BID. Given duration of Afib was started on coumadin today by [facility]CTS. Overall appears asymptomatic.   2. CAD s/p 3v CABG (LIMA to LAD, SVG to Diag, and SVG to RCA): post op day 5, seems to be progressing well. Just finished her 3 time walking in the hallway with PT. On ASA, BB, fenofibrate. Intolerant to statins.   3. HTN: Controlled with current therapy  4. Anemia: post op anemia noted. Hgb improving. Need to monitor closely with the initiation of coumadin.   Barnet Pall, NP-C Pager [phone] 02/25/2017, 2:21 PM

## 2017-02-25 NOTE — Progress Notes (Signed)
Physical Therapy Treatment Patient Details Name: Stacey [name] MRN: [id number] DOB: 12/25/1940 Today's Date: 02/25/2017    History of Present Illness Stacey [name] y.o.femaletrans[facility] of cabg .On 02/19/2017 she experienced 2 episodes of substernal chest pressure with associated shortness of breath which lasted approximately 15-20 minutes. In [facility] ED Initial troponin was elevated to 1.2. Follow-up troponins were 4.34 and 6.38. 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 PMH: fibromyalgia, HTN, thyroid disease, PONV.     PT Comments    Patient tolerated ambulating 245ft with min A and RW. Pt required standing rest break due to 2/4 DOE. Pt with HR up to 125, RR up to 40, and SpO2 95%. Mod A for bed mobility. Continue to progress as tolerated.    Follow Up Recommendations  Home health PT;Supervision/Assistance - 24 hour     Equipment Recommendations  Rolling walker with 5" wheels    Recommendations for Other Services OT consult     Precautions / Restrictions Precautions Precautions: Fall;Sternal Precaution Comments: reviewed sternal precautions, pt with appropriate use of pillow Restrictions Weight Bearing Restrictions: Yes (sternal precautions)    Mobility  Bed Mobility Overal bed mobility: Needs Assistance Bed Mobility: Rolling;Sidelying to Sit Rolling: Mod assist Sidelying to sit: Mod assist       General bed mobility comments: assist to elevate trunk into sitting and to scoot hips toward EOB; pt hugging cardiac pillow  Transfers Overall transfer level: Needs assistance Equipment used: Rolling walker (2 wheeled) Transfers: Sit to/from Stand Sit to Stand: Min assist         General transfer comment: cues for technique; assist to power up into standing with pt hugging cardiac pillow  Ambulation/Gait Ambulation/Gait assistance: Min assist;+2 safety/equipment Ambulation Distance (Feet): 200 Feet Assistive device: Rolling  walker (2 wheeled) Gait Pattern/deviations: Step-through pattern;Decreased step length - right;Decreased step length - left     General Gait Details: slow, steady gait; standing rest break and cues for pursed lip breathing   Stairs            Wheelchair Mobility    Modified Rankin (Stroke Patients Only)       Balance Overall balance assessment: Needs assistance Sitting-balance support: No upper extremity supported Sitting balance-Leahy Scale: Fair     Standing balance support: Bilateral upper extremity supported Standing balance-Leahy Scale: Poor                              Cognition Arousal/Alertness: Awake/alert Behavior During Therapy: WFL for tasks assessed/performed Overall Cognitive Status: Within Functional Limits for tasks assessed                                        Exercises      General Comments General comments (skin integrity, edema, etc.): HR up to 125 with pt in Afib, Spo2 95% on RA, and RR up to 40       Pertinent Vitals/Pain Pain Assessment: Faces Faces Pain Scale: Hurts even more Pain Location: chest  Pain Descriptors / Indicators: Sore;Grimacing Pain Intervention(s): Limited activity within patient's tolerance;Monitored during session;Repositioned    Home Living                      Prior Function            PT Goals (current goals can now be  found in the care plan section) Acute Rehab PT Goals Patient Stated Goal: return home PT Goal Formulation: With patient Time For Goal Achievement: 03/08/17 Potential to Achieve Goals: Good Progress towards PT goals: Progressing toward goals    Frequency    Min 3X/week      PT Plan Current plan remains appropriate    Co-evaluation              AM-PAC PT "6 Clicks" Daily Activity  Outcome Measure  Difficulty turning over in bed (including adjusting bedclothes, sheets and blankets)?: Unable Difficulty moving from lying on back to  sitting on the side of the bed? : Unable Difficulty sitting down on and standing up from a chair with arms (e.g., wheelchair, bedside commode, etc,.)?: Unable Help needed moving to and from a bed to chair (including a wheelchair)?: A Little Help needed walking in hospital room?: A Little Help needed climbing 3-5 steps with a railing? : A Lot 6 Click Score: 11    End of Session Equipment Utilized During Treatment: Gait belt Activity Tolerance: Patient tolerated treatment well Patient left: with call bell/phone within reach;with family/visitor present;in chair Nurse Communication: Mobility status PT Visit Diagnosis: Unsteadiness on feet (R26.81);Other abnormalities of gait and mobility (R26.89);Repeated falls (R29.6);Muscle weakness (generalized) (M62.81);Difficulty in walking, not elsewhere classified (R26.2) Pain - Right/Left: Right Pain - part of body: Leg     Time: 7494-4967 PT Time Calculation (min) (ACUTE ONLY): 20 min  Charges:  $Gait Training: 8-22 mins                    G Codes:       Earney Navy, PTA Pager: [phone]     Darliss Cheney 02/25/2017, 4:48 PM

## 2017-02-25 NOTE — Progress Notes (Signed)
02/25/2017 1400 Received pt to room 4E-04 from [place].  Pt is A&O, no c/o voiced.  Tele monitor applied and CCMD notified.  Oriented to room, call light and bed.  Call bell in reach, family at bedside.

## 2017-02-25 NOTE — Progress Notes (Signed)
OT Cancellation Note  Patient Details Name: Stacey [name] MRN: [id number] DOB: 31-Jan-1941   Cancelled Treatment:    Reason Eval/Treat Not Completed: Fatigue/lethargy limiting ability to participate.   Just finished with PT.  Will reattempt.  [place], OTR/L [phone]   Lucille Passy M 02/25/2017, 4:57 PM

## 2017-02-25 NOTE — Progress Notes (Signed)
02/25/2017 1800 EPW D/C'd per order and per protocol.  Ends intact. Pt. Tolerated well.  Advised bedrest x1hr.  Call bell in reach.  Vital signs collected per protocol. [place]

## 2017-02-26 ENCOUNTER — Inpatient Hospital Stay (HOSPITAL_COMMUNITY): Payer: Medicare Other

## 2017-02-26 LAB — BASIC METABOLIC PANEL
Anion gap: 8 (ref 5–15)
BUN: 15 mg/dL (ref 6–20)
CO2: 30 mmol/L (ref 22–32)
Calcium: 8.3 mg/dL — ABNORMAL LOW (ref 8.9–10.3)
Chloride: 98 mmol/L — ABNORMAL LOW (ref 101–111)
Creatinine, Ser: 1 mg/dL (ref 0.44–1.00)
GFR calc Af Amer: >60 mL/min (ref >60)
GFR calc non Af Amer: 54 mL/min — ABNORMAL LOW (ref >60)
Glucose, Bld: 93 mg/dL (ref 65–99)
Potassium: 4.1 mmol/L (ref 3.5–5.1)
Sodium: 136 mmol/L (ref 135–145)

## 2017-02-26 LAB — CBC
HCT: 27.1 % — ABNORMAL LOW (ref 36.0–46.0)
Hemoglobin: 8.7 g/dL — ABNORMAL LOW (ref 12.0–15.0)
MCH: 28.5 pg (ref 26.0–34.0)
MCHC: 32.1 g/dL (ref 30.0–36.0)
MCV: 88.9 fL (ref 78.0–100.0)
Platelets: 216 10*3/uL (ref 150–400)
RBC: 3.05 MIL/uL — ABNORMAL LOW (ref 3.87–5.11)
RDW: 14.5 % (ref 11.5–15.5)
WBC: 5.8 10*3/uL (ref 4.0–10.5)

## 2017-02-26 LAB — GLUCOSE, CAPILLARY
Glucose-Capillary: 97 mg/dL (ref 65–99)
Glucose-Capillary: 101 mg/dL — ABNORMAL HIGH (ref 65–99)
Glucose-Capillary: 116 mg/dL — ABNORMAL HIGH (ref 65–99)
Glucose-Capillary: 146 mg/dL — ABNORMAL HIGH (ref 65–99)

## 2017-02-26 MED ORDER — POTASSIUM CHLORIDE CRYS ER 20 MEQ PO TBCR
20.0000 meq | EXTENDED_RELEASE_TABLET | Freq: Every day | ORAL | Status: AC
  Administered 2017-02-26 – 2017-02-28 (×3): 20 meq via ORAL
  Filled 2017-02-26 (×3): qty 1

## 2017-02-26 MED ORDER — FUROSEMIDE 40 MG PO TABS
40.0000 mg | ORAL_TABLET | Freq: Every day | ORAL | Status: AC
  Administered 2017-02-26 – 2017-02-28 (×3): 40 mg via ORAL
  Filled 2017-02-26 (×3): qty 1

## 2017-02-26 MED ORDER — METOPROLOL TARTRATE 25 MG PO TABS
25.0000 mg | ORAL_TABLET | Freq: Two times a day (BID) | ORAL | Status: DC
  Administered 2017-02-26 – 2017-02-27 (×2): 25 mg via ORAL
  Filled 2017-02-26 (×3): qty 1

## 2017-02-26 MED ORDER — METOPROLOL TARTRATE 12.5 MG HALF TABLET
12.5000 mg | ORAL_TABLET | Freq: Once | ORAL | Status: AC
  Administered 2017-02-26: 12.5 mg via ORAL

## 2017-02-26 NOTE — Progress Notes (Signed)
Occupational Therapy Treatment Patient Details Name: Stacey [name] MRN: [id number] DOB: 10-18-1940 Today's Date: 02/26/2017    History of present illness Stacey [name] [id number] y.o.femaletransferred emergently from [place] for consideration of cabg .On 02/19/2017 she experienced 2 episodes of substernal chest pressure with associated shortness of breath which lasted approximately 15-20 minutes. In [facility] ED Initial troponin was elevated to 1.2. Follow-up troponins were 4.34 and 6.38. 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 PMH: fibromyalgia, HTN, thyroid disease, PONV.    OT comments  Pt is making excellent progress toward OT goals.  She requires min guard assist to min A for LB ADLs and min guard assist for functional transfers. HR to 111 with activity.  Reviewed energy conservation techniques and need to pace self.  Family is very supportive.    Follow Up Recommendations  Home health OT;Supervision/Assistance - 24 hour    Equipment Recommendations  Tub/shower seat    Recommendations for Other Services      Precautions / Restrictions Precautions Precautions: Fall;Sternal Precaution Comments: reviewed sternal precautions, pt with appropriate use of pillow       Mobility Bed Mobility Overal bed mobility: Needs Assistance Bed Mobility: Rolling;Sidelying to Sit;Sit to Sidelying Rolling: Min assist Sidelying to sit: Min assist     Sit to sidelying: Min assist General bed mobility comments: assist to lift trunk and min A to lift LEs back onto bed   Transfers Overall transfer level: Needs assistance Equipment used: Rolling walker (2 wheeled) Transfers: Sit to/from [facility] Sit to Stand: Min guard Stand pivot transfers: Min guard       General transfer comment: demonstrates good technique     Balance Overall balance assessment: Needs assistance Sitting-balance support: No upper extremity supported Sitting balance-Leahy Scale: Good     Standing balance  support: No upper extremity supported;During functional activity Standing balance-Leahy Scale: Fair                             ADL either performed or assessed with clinical judgement   ADL Overall ADL's : Needs assistance/impaired     Grooming: Wash/dry hands;Wash/dry face;Min guard;Standing       Lower Body Bathing: Min guard;Sit to/from stand   Upper Body Dressing : Set up;Supervision/safety;Sitting   Lower Body Dressing: Minimal assistance;Sit to/from stand Lower Body Dressing Details (indicate cue type and reason): min A for socks  Toilet Transfer: Min guard;Ambulation;Regular Toilet;RW   Toileting- Water quality scientist and Hygiene: Min guard;Sit to/from Nurse, children's Details (indicate cue type and reason): Pt will sponge bathe initially  Functional mobility during ADLs: Min guard;Rolling walker General ADL Comments: Pt demonstrates good awareness of sternal precautions.  Discussed energy conservation techniques and need to pace self      Vision       Perception     Praxis      Cognition Arousal/Alertness: Awake/alert Behavior During Therapy: WFL for tasks assessed/performed Overall Cognitive Status: Within Functional Limits for tasks assessed                                          Exercises     Shoulder Instructions       General Comments HR to 111 pt required 3 standing rest breaks during activity     Pertinent Vitals/ Pain       Pain Assessment:  Faces Faces Pain Scale: Hurts little more Pain Location: chest  Pain Descriptors / Indicators: Sore;Grimacing Pain Intervention(s): Monitored during session  Home Living                                          Prior Functioning/Environment              Frequency  Min 2X/week        Progress Toward Goals  OT Goals(current goals can now be found in the care plan section)  Progress towards OT goals: Progressing toward  goals     Plan Discharge plan remains appropriate    Co-evaluation                 AM-PAC PT "6 Clicks" Daily Activity     Outcome Measure   Help from another person eating meals?: None Help from another person taking care of personal grooming?: A Little Help from another person toileting, which includes using toliet, bedpan, or urinal?: A Little Help from another person bathing (including washing, rinsing, drying)?: A Little Help from another person to put on and taking off regular upper body clothing?: A Little Help from another person to put on and taking off regular lower body clothing?: A Little 6 Click Score: 19    End of Session Equipment Utilized During Treatment: Rolling walker  OT Visit Diagnosis: Muscle weakness (generalized) (M62.81);Other abnormalities of gait and mobility (R26.89)   Activity Tolerance Patient tolerated treatment well   Patient Left in bed;with call bell/phone within reach;with family/visitor present   Nurse Communication Mobility status        Time: 5916-3846 OT Time Calculation (min): 35 min  Charges: OT General Charges $OT Visit: 1 Visit OT Treatments $Self Care/Home Management : 8-22 mins $Therapeutic Activity: 8-22 mins  [facility], OTR/L [phone]    Stacey [name] 02/26/2017, 4:07 PM
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
## 2017-02-26 NOTE — Progress Notes (Addendum)
    [name]       [place], [address]             [phone]      6 Days Post-Op Procedure(s) (LRB): CORONARY ARTERY BYPASS GRAFTING (CABG) x , three using left internal mammary artery to left anterior descending coronary artery and right greater saphenous vein harvested endoscopically to distal right and diagonal coronary arteries. (N/A) TRANSESOPHAGEAL ECHOCARDIOGRAM (TEE) (N/A) Subjective: Feels fairly well, + afib to 120's at times  Objective: Vital signs in last 24 hours: Temp:  [97.8 F (36.6 C)-98.7 F (37.1 C)] 98.7 F (37.1 C) (10/05 0500) Pulse Rate:  [82-107] 89 (10/05 0500) Cardiac Rhythm: Atrial fibrillation (10/05 0701) Resp:  [15-40] 15 (10/05 0500) BP: (108-145)/(56-89) 122/79 (10/05 0500) SpO2:  [93 %-100 %] 93 % (10/05 0500) Weight:  [181 lb 11.2 oz (82.4 kg)] 181 lb 11.2 oz (82.4 kg) (10/05 0500)  Hemodynamic parameters for last 24 hours:    Intake/Output from previous day: 10/04 0701 - 10/05 0700 In: 840 [P.O.:840] Out: 700 [Urine:700] Intake/Output this shift: No intake/output data recorded.  General appearance: alert, cooperative and no distress Heart: irregularly irregular rhythm and tachy Lungs: min dim in bases Abdomen: soft, nontender Extremities: + edema, minor in RLE Wound: no signs of infection  Lab Results:  Recent Labs  02/25/17 0317 02/26/17 0304  WBC 5.9 5.8  HGB 9.0* 8.7*  HCT 28.5* 27.1*  PLT 203 216   BMET:  Recent Labs  02/25/17 1814 02/26/17 0304  NA 136 136  K 4.3 4.1  CL 98* 98*  CO2 28 30  GLUCOSE 115* 93  BUN 16 15  CREATININE 1.00 1.00  CALCIUM 8.6* 8.3*    PT/INR:  Recent Labs  02/25/17 0317  LABPROT 14.1  INR 1.10   ABG    Component Value Date/Time   PHART 7.338 (L) 02/21/2017 0133   HCO3 26.0 02/21/2017 0133   TCO2 28 02/21/2017 0133   ACIDBASEDEF 1.0 02/21/2017 0000   O2SAT 92.0 02/21/2017 0133   CBG (last 3)   Recent Labs  02/25/17 1211 02/25/17 2138  02/26/17 0625  GLUCAP 110* 96 97    Meds Scheduled Meds: . amiodarone  200 mg Oral Q12H   Followed by  . [START ON 03/02/2017] amiodarone  200 mg Oral Daily  . apixaban  5 mg Oral BID  . aspirin EC  81 mg Oral Daily  . buPROPion  150 mg Oral Daily  . chlorhexidine gluconate (MEDLINE KIT)  15 mL Mouth Rinse BID  . escitalopram  10 mg Oral Daily  . fenofibrate  54 mg Oral Daily  . folic acid  1 mg Oral Daily  . insulin aspart  0-24 Units Subcutaneous TID AC & HS  . levalbuterol  0.63 mg Nebulization TID  . levothyroxine  88 mcg Oral QAC breakfast  . metoprolol tartrate  12.5 mg Oral BID  . pantoprazole  40 mg Oral QAC breakfast  . sodium chloride flush  3 mL Intravenous Q12H   Continuous Infusions: . sodium chloride     PRN Meds:.sodium chloride, acetaminophen, alum & mag hydroxide-simeth, bisacodyl **OR** bisacodyl, ondansetron **OR** ondansetron (ZOFRAN) IV, oxyCODONE, sodium chloride flush, traMADol  Xrays Dg Chest 2 View  Result Date: 02/26/2017 CLINICAL DATA:  Check postoperative status EXAM: CHEST  2 VIEW COMPARISON:  08/30/2008 FINDINGS: Cardiac shadow is enlarged. Postsurgical changes are again noted. Aortic calcifications are seen. Improved aeration is noted bilaterally with only minimal left basilar   atelectasis. Small pleural effusions are noted posteriorly. No acute bony abnormality is noted. IMPRESSION: Minimal left basilar atelectasis and small effusions. Electronically Signed   By: Mark  Lukens M.D.   On: 02/26/2017 06:55   Dg Chest Port 1 View  Result Date: 02/25/2017 CLINICAL DATA:  Status post CABG on February 20, 2017 EXAM: PORTABLE CHEST 1 VIEW COMPARISON:  Portable chest x-ray of February 24, 2017 FINDINGS: The lungs are reasonably well inflated. The interstitial markings have improved. No more than a trace of pleural fluid remains bilaterally. There is persistent left lower lobe atelectasis. The cardiac silhouette remains enlarged. The pulmonary vascularity is  nearly normal. The sternal wires are intact. There is calcification in the wall of the aortic arch. IMPRESSION: Further interval improvement in the appearance of the pulmonary interstitium with decreased interstitial edema. Tiny bilateral pleural effusions persist as well as mild left basilar atelectasis. Electronically Signed   By: David  Jordan M.D.   On: 02/25/2017 07:35    Assessment/Plan: S/P Procedure(s) (LRB): CORONARY ARTERY BYPASS GRAFTING (CABG) x , three using left internal mammary artery to left anterior descending coronary artery and right greater saphenous vein harvested endoscopically to distal right and diagonal coronary arteries. (N/A) TRANSESOPHAGEAL ECHOCARDIOGRAM (TEE) (N/A)  1 doing well 2 cardiology assisting with afib management 3 wires are out, now on apixaban 4 will add a little diuretic 5 sugars conrolled 6 H/H pretty stable, follow 7 renal fxn is normal 6 cont pulm toilet/rehab   LOS: 6 days    Stacey [name],Stacey [name] 02/26/2017   Started on apiaxpan today for afib , wires out last pm  Home 1-2 days depending on afib  I have seen and examined Stacey [name] and agree with the above assessment  and plan.  Stacey B Gerhardt MD Beeper [phone] Office [phone] 02/26/2017 10:59 AM       

## 2017-02-26 NOTE — Discharge Instructions (Addendum)
Endoscopic Saphenous Vein Harvesting, Care After °Refer to this sheet in the next few weeks. These instructions provide you with information about caring for yourself after your procedure. Your health care provider may also give you more specific instructions. Your treatment has been planned according to current medical practices, but problems sometimes occur. Call your health care provider if you have any problems or questions after your procedure. °What can I expect after the procedure? °After the procedure, it is common to have: °· Pain. °· Bruising. °· Swelling. °· Numbness. ° °Follow these instructions at home: °Medicine °· Take over-the-counter and prescription medicines only as told by your health care provider. °· Do not drive or operate heavy machinery while taking prescription pain medicine. °Incision care ° °· Follow instructions from your health care provider about how to take care of the cut made during surgery (incision). Make sure you: °? Wash your hands with soap and water before you change your bandage (dressing). If soap and water are not available, use hand sanitizer. °? Change your dressing as told by your health care provider. °? Leave stitches (sutures), skin glue, or adhesive strips in place. These skin closures may need to be in place for 2 weeks or longer. If adhesive strip edges start to loosen and curl up, you may trim the loose edges. Do not remove adhesive strips completely unless your health care provider tells you to do that. °· Check your incision area every day for signs of infection. Check for: °? More redness, swelling, or pain. °? More fluid or blood. °? Warmth. °? Pus or a bad smell. °General instructions °· Raise (elevate) your legs above the level of your heart while you are sitting or lying down. °· Do any exercises your health care providers have given you. These may include deep breathing, coughing, and walking exercises. °· Do not shower, take baths, swim, or use a hot tub  unless told by your health care provider. °· Wear your elastic stocking if told by your health care provider. °· Keep all follow-up visits as told by your health care provider. This is important. °Contact a health care provider if: °· Medicine does not help your pain. °· Your pain gets worse. °· You have new leg bruises or your leg bruises get bigger. °· You have a fever. °· Your leg feels numb. °· You have more redness, swelling, or pain around your incision. °· You have more fluid or blood coming from your incision. °· Your incision feels warm to the touch. °· You have pus or a bad smell coming from your incision. °Get help right away if: °· Your pain is severe. °· You develop pain, tenderness, warmth, redness, or swelling in any part of your leg. °· You have chest pain. °· You have trouble breathing. °This information is not intended to replace advice given to you by your health care provider. Make sure you discuss any questions you have with your health care provider. °Document Released: 01/21/2011 Document Revised: 10/17/2015 Document Reviewed: 03/25/2015 °Elsevier Interactive Patient Education © 2018 [employer] °Coronary Artery Bypass Grafting, Care After °These instructions give you information on caring for yourself after your procedure. Your doctor may also give you more specific instructions. Call your doctor if you have any problems or questions after your procedure. °Follow these instructions at home: °· Only take medicine as told by your doctor. Take medicines exactly as told. Do not stop taking medicines or start any new medicines without talking to your doctor first. °·   Take your pulse as told by your doctor.  Do deep breathing as told by your doctor. Use your breathing device (incentive spirometer), if given, to practice deep breathing several times a day. Support your chest with a pillow or your arms when you take deep breaths or cough.  Keep the area clean, dry, and protected where the  surgery cuts (incisions) were made. Remove bandages (dressings) only as told by your doctor. If strips were applied to surgical area, do not take them off. They fall off on their own.  Check the surgery area daily for puffiness (swelling), redness, or leaking fluid.  If surgery cuts were made in your legs: ? Avoid crossing your legs. ? Avoid sitting for long periods of time. Change positions every 30 minutes. ? Raise your legs when you are sitting. Place them on pillows.  Wear stockings that help keep blood clots from forming in your legs (compression stockings).  Only take sponge baths until your doctor says it is okay to take showers. Pat the surgery area dry. Do not rub the surgery area with a washcloth or towel. Do not bathe, swim, or use a hot tub until your doctor says it is okay.  Eat foods that are high in fiber. These include raw fruits and vegetables, whole grains, beans, and nuts. Choose lean meats. Avoid canned, processed, and fried foods.  Drink enough fluids to keep your pee (urine) clear or pale yellow.  Weigh yourself every day.  Rest and limit activity as told by your doctor. You may be told to: ? Stop any activity if you have chest pain, shortness of breath, changes in heartbeat, or dizziness. Get help right away if this happens. ? Move around often for short amounts of time or take short walks as told by your doctor. Gradually become more active. You may need help to strengthen your muscles and build endurance. ? Avoid lifting, pushing, or pulling anything heavier than 10 pounds (4.5 kg) for at least 6 weeks after surgery.  Do not drive until your doctor says it is okay.  Ask your doctor when you can go back to work.  Ask your doctor when you can begin sexual activity again.  Follow up with your doctor as told. Contact a doctor if:  You have puffiness, redness, more pain, or fluid draining from the incision site.  You have a fever.  You have puffiness in your  ankles or legs.  You have pain in your legs.  You gain 2 or more pounds (0.9 kg) a day.  You feel sick to your stomach (nauseous) or throw up (vomit).  You have watery poop (diarrhea). Get help right away if:  You have chest pain that goes to your jaw or arms.  You have shortness of breath.  You have a fast or irregular heartbeat.  You notice a "clicking" in your breastbone when you move.  You have numbness or weakness in your arms or legs.  You feel dizzy or light-headed. This information is not intended to replace advice given to you by your health care provider. Make sure you discuss any questions you have with your health care provider. Document Released: 05/16/2013 Document Revised: 10/17/2015 Document Reviewed: 10/18/2012 Elsevier Interactive Patient Education  2017 [place] on my medicine - ELIQUIS (apixaban)   Why was Eliquis prescribed for you? Eliquis was prescribed for you to reduce the risk of a blood clot forming that can cause a stroke if you have a  medical condition called atrial fibrillation (a type of irregular heartbeat).  What do You need to know about Eliquis ? Take your Eliquis TWICE DAILY - one tablet in the morning and one tablet in the evening with or without food. If you have difficulty swallowing the tablet whole please discuss with your pharmacist how to take the medication safely.  Take Eliquis exactly as prescribed by your doctor and DO NOT stop taking Eliquis without talking to the doctor who prescribed the medication.  Stopping may increase your risk of developing a stroke.  Refill your prescription before you run out.  After discharge, you should have regular check-up appointments with your healthcare provider that is prescribing your Eliquis.  In the future your dose may need to be changed if your kidney function or weight changes by a significant amount or as you get older.  What do you do if you miss a dose? If  you miss a dose, take it as soon as you remember on the same day and resume taking twice daily.  Do not take more than one dose of ELIQUIS at the same time to make up a missed dose.  Important Safety Information A possible side effect of Eliquis is bleeding. You should call your healthcare provider right away if you experience any of the following: ? Bleeding from an injury or your nose that does not stop. ? Unusual colored urine (red or dark brown) or unusual colored stools (red or black). ? Unusual bruising for unknown reasons. ? A serious fall or if you hit your head (even if there is no bleeding).  Some medicines may interact with Eliquis and might increase your risk of bleeding or clotting while on Eliquis. To help avoid this, consult your healthcare provider or pharmacist prior to using any new prescription or non-prescription medications, including herbals, vitamins, non-steroidal anti-inflammatory drugs (NSAIDs) and supplements.  This website has more information on Eliquis (apixaban): http://www.eliquis.com/eliquis/home

## 2017-02-26 NOTE — Progress Notes (Addendum)
DAILY PROGRESS NOTE   Patient Name: Stacey [name] Date of Encounter: 02/26/2017  Hospital Problem List   Active Problems:   S/P CABG x 4   PAF (paroxysmal atrial fibrillation) ([place])    Chief Complaint   Feels well  Subjective   HR noted to be up at times into the 120's. On amiodarone and metoprolol. Eliquis started this am - pacer wires removed overnight.  Objective   Vitals:   02/26/17 0051 02/26/17 0500 02/26/17 0806 02/26/17 0815  BP: 116/60 122/79 134/84 134/84  Pulse: (!) 103 89 (!) 105 99  Resp: '18 15 18 18  '$ Temp: 97.8 F (36.6 C) 98.7 F (37.1 C) 98.1 F (36.7 C)   TempSrc: Oral Oral Oral   SpO2: 94% 93% 97% 97%  Weight:  181 lb 11.2 oz (82.4 kg)    Height:        Intake/Output Summary (Last 24 hours) at 02/26/17 1041 Last data filed at 02/26/17 0253  Gross per 24 hour  Intake              600 ml  Output              700 ml  Net             -100 ml   Filed Weights   02/24/17 0445 02/25/17 0500 02/26/17 0500  Weight: 192 lb 10.9 oz (87.4 kg) 181 lb 3.5 oz (82.2 kg) 181 lb 11.2 oz (82.4 kg)    Physical Exam   General appearance: alert and no distress Lungs: clear to auscultation bilaterally Heart: regular rate and rhythm, S1, S2 normal, no murmur, click, rub or gallop Extremities: extremities normal, atraumatic, no cyanosis or edema Neurologic: Grossly normal  Inpatient Medications    Scheduled Meds: . amiodarone  200 mg Oral Q12H   Followed by  . [START ON 03/02/2017] amiodarone  200 mg Oral Daily  . apixaban  5 mg Oral BID  . aspirin EC  81 mg Oral Daily  . buPROPion  150 mg Oral Daily  . chlorhexidine gluconate (MEDLINE KIT)  15 mL Mouth Rinse BID  . escitalopram  10 mg Oral Daily  . fenofibrate  54 mg Oral Daily  . folic acid  1 mg Oral Daily  . furosemide  40 mg Oral Daily  . insulin aspart  0-24 Units Subcutaneous TID AC & HS  . levalbuterol  0.63 mg Nebulization TID  . levothyroxine  88 mcg Oral QAC breakfast  . metoprolol  tartrate  12.5 mg Oral Once  . metoprolol tartrate  25 mg Oral BID  . pantoprazole  40 mg Oral QAC breakfast  . potassium chloride  20 mEq Oral Daily  . sodium chloride flush  3 mL Intravenous Q12H    Continuous Infusions: . sodium chloride      PRN Meds: sodium chloride, acetaminophen, alum & mag hydroxide-simeth, bisacodyl **OR** bisacodyl, ondansetron **OR** ondansetron (ZOFRAN) IV, oxyCODONE, sodium chloride flush, traMADol   Labs   Results for orders placed or performed during the hospital encounter of 02/20/17 (from the past 48 hour(s))  Glucose, capillary     Status: Abnormal   Collection Time: 02/24/17 11:19 AM  Result Value Ref Range   Glucose-Capillary 102 (H) 65 - 99 mg/dL   Comment 1 Notify RN   Glucose, capillary     Status: Abnormal   Collection Time: 02/24/17  4:39 PM  Result Value Ref Range   Glucose-Capillary 112 (H) 65 - 99 mg/dL  Comment 1 Notify RN   Glucose, capillary     Status: Abnormal   Collection Time: 02/24/17  7:34 PM  Result Value Ref Range   Glucose-Capillary 128 (H) 65 - 99 mg/dL   Comment 1 Capillary Specimen   Glucose, capillary     Status: Abnormal   Collection Time: 02/24/17  9:20 PM  Result Value Ref Range   Glucose-Capillary 107 (H) 65 - 99 mg/dL   Comment 1 Capillary Specimen   Basic metabolic panel     Status: Abnormal   Collection Time: 02/25/17  3:17 AM  Result Value Ref Range   Sodium 135 135 - 145 mmol/L   Potassium 3.9 3.5 - 5.1 mmol/L   Chloride 98 (L) 101 - 111 mmol/L   CO2 28 22 - 32 mmol/L   Glucose, Bld 115 (H) 65 - 99 mg/dL   BUN 22 (H) 6 - 20 mg/dL   Creatinine, Ser 1.12 (H) 0.44 - 1.00 mg/dL   Calcium 8.3 (L) 8.9 - 10.3 mg/dL   GFR calc non Af Amer 47 (L) >60 mL/min   GFR calc Af Amer 55 (L) >60 mL/min    Comment: (NOTE) The eGFR has been calculated using the CKD EPI equation. This calculation has not been validated in all clinical situations. eGFR's persistently <60 mL/min signify possible Chronic  Kidney Disease.    Anion gap 9 5 - 15  CBC     Status: Abnormal   Collection Time: 02/25/17  3:17 AM  Result Value Ref Range   WBC 5.9 4.0 - 10.5 K/uL   RBC 3.22 (L) 3.87 - 5.11 MIL/uL   Hemoglobin 9.0 (L) 12.0 - 15.0 g/dL   HCT 28.5 (L) 36.0 - 46.0 %   MCV 88.5 78.0 - 100.0 fL   MCH 28.0 26.0 - 34.0 pg   MCHC 31.6 30.0 - 36.0 g/dL   RDW 14.3 11.5 - 15.5 %   Platelets 203 150 - 400 K/uL  Protime-INR     Status: None   Collection Time: 02/25/17  3:17 AM  Result Value Ref Range   Prothrombin Time 14.1 11.4 - 15.2 seconds   INR 1.10   Glucose, capillary     Status: None   Collection Time: 02/25/17  8:24 AM  Result Value Ref Range   Glucose-Capillary 98 65 - 99 mg/dL  Glucose, capillary     Status: Abnormal   Collection Time: 02/25/17 12:11 PM  Result Value Ref Range   Glucose-Capillary 110 (H) 65 - 99 mg/dL   Comment 1 Capillary Specimen    Comment 2 Notify RN   Basic metabolic panel     Status: Abnormal   Collection Time: 02/25/17  6:14 PM  Result Value Ref Range   Sodium 136 135 - 145 mmol/L   Potassium 4.3 3.5 - 5.1 mmol/L   Chloride 98 (L) 101 - 111 mmol/L   CO2 28 22 - 32 mmol/L   Glucose, Bld 115 (H) 65 - 99 mg/dL   BUN 16 6 - 20 mg/dL   Creatinine, Ser 1.00 0.44 - 1.00 mg/dL   Calcium 8.6 (L) 8.9 - 10.3 mg/dL   GFR calc non Af Amer 54 (L) >60 mL/min   GFR calc Af Amer >60 >60 mL/min    Comment: (NOTE) The eGFR has been calculated using the CKD EPI equation. This calculation has not been validated in all clinical situations. eGFR's persistently <60 mL/min signify possible Chronic Kidney Disease.    Anion gap 10 5 - 15  Glucose, capillary     Status: None   Collection Time: 02/25/17  9:38 PM  Result Value Ref Range   Glucose-Capillary 96 65 - 99 mg/dL  CBC     Status: Abnormal   Collection Time: 02/26/17  3:04 AM  Result Value Ref Range   WBC 5.8 4.0 - 10.5 K/uL   RBC 3.05 (L) 3.87 - 5.11 MIL/uL   Hemoglobin 8.7 (L) 12.0 - 15.0 g/dL   HCT 27.1 (L) 36.0 -  46.0 %   MCV 88.9 78.0 - 100.0 fL   MCH 28.5 26.0 - 34.0 pg   MCHC 32.1 30.0 - 36.0 g/dL   RDW 14.5 11.5 - 15.5 %   Platelets 216 150 - 400 K/uL  Basic metabolic panel     Status: Abnormal   Collection Time: 02/26/17  3:04 AM  Result Value Ref Range   Sodium 136 135 - 145 mmol/L   Potassium 4.1 3.5 - 5.1 mmol/L   Chloride 98 (L) 101 - 111 mmol/L   CO2 30 22 - 32 mmol/L   Glucose, Bld 93 65 - 99 mg/dL   BUN 15 6 - 20 mg/dL   Creatinine, Ser 1.00 0.44 - 1.00 mg/dL   Calcium 8.3 (L) 8.9 - 10.3 mg/dL   GFR calc non Af Amer 54 (L) >60 mL/min   GFR calc Af Amer >60 >60 mL/min    Comment: (NOTE) The eGFR has been calculated using the CKD EPI equation. This calculation has not been validated in all clinical situations. eGFR's persistently <60 mL/min signify possible Chronic Kidney Disease.    Anion gap 8 5 - 15  Glucose, capillary     Status: None   Collection Time: 02/26/17  6:25 AM  Result Value Ref Range   Glucose-Capillary 97 65 - 99 mg/dL    ECG   N/A  Telemetry   A-fib with rates up to 120's - Personally Reviewed  Radiology    Dg Chest 2 View  Result Date: 02/26/2017 CLINICAL DATA:  Check postoperative status EXAM: CHEST  2 VIEW COMPARISON:  08/30/2008 FINDINGS: Cardiac shadow is enlarged. Postsurgical changes are again noted. Aortic calcifications are seen. Improved aeration is noted bilaterally with only minimal left basilar atelectasis. Small pleural effusions are noted posteriorly. No acute bony abnormality is noted. IMPRESSION: Minimal left basilar atelectasis and small effusions. Electronically Signed   By: Inez Catalina M.D.   On: 02/26/2017 06:55   Dg Chest Port 1 View  Result Date: 02/25/2017 CLINICAL DATA:  Status post CABG on February 20, 2017 EXAM: PORTABLE CHEST 1 VIEW COMPARISON:  Portable chest x-ray of February 24, 2017 FINDINGS: The lungs are reasonably well inflated. The interstitial markings have improved. No more than a trace of pleural fluid remains  bilaterally. There is persistent left lower lobe atelectasis. The cardiac silhouette remains enlarged. The pulmonary vascularity is nearly normal. The sternal wires are intact. There is calcification in the wall of the aortic arch. IMPRESSION: Further interval improvement in the appearance of the pulmonary interstitium with decreased interstitial edema. Tiny bilateral pleural effusions persist as well as mild left basilar atelectasis. Electronically Signed   By: David  Martinique M.D.   On: 02/25/2017 07:35    Cardiac Studies   N/A  Assessment   1. Active Problems: 2.   S/P CABG x 4 3.   PAF (paroxysmal atrial fibrillation) ([place]) 4.   Plan   1. Close to discharge - will give additional B-blocker this am and increase lopressor to 25  mg BID. On amiodarone and Eliquis. May need a DCCV after 3 weeks if she remains in a-fib. Follow-up with Dr. [name].  Time Spent Directly with Patient:  I have spent a total of 15 minutes with the patient reviewing hospital notes, telemetry, EKGs, labs and examining the patient as well as establishing an assessment and plan that was discussed personally with the patient. > 50% of time was spent in direct patient care.  Length of Stay:  LOS: 6 days   Pixie Casino, MD, [place]  Attending Cardiologist  Direct Dial: [phone]  Fax: [phone]  Website:  [email or web address] Camron Essman 02/26/2017, 10:41 AM

## 2017-02-26 NOTE — Care Management Important Message (Signed)
Important Message  Patient Details  Name: Stacey [name] MRN: [id number] Date of Birth: Jan 07, 1941   Medicare Important Message Given:  Yes    Nathen May 02/26/2017, 9:46 AM

## 2017-02-26 NOTE — Progress Notes (Signed)
CARDIAC REHAB PHASE I   PRE:  Rate/Rhythm: 110 a fib  BP:  Sitting: 123/83        SaO2: 97 RA  MODE:  Ambulation: 300 ft   POST:  Rate/Rhythm: 115 a fib  BP:  Sitting: 111/73         SaO2: 98 RA  Pt ambulated 300 ft on RA, rolling walker, assist x1, steady gait, tolerated fairly well. Pt c/o DOE, standing rest x3. HR 110s during ambulation. Cardiac surgery discharge education completed with pt, pt's husband and daughter at bedside. Reviewed risk factors, IS, sternal precautions, activity progression, exercise, heart healthy diet, daily weights and phase 2 cardiac rehab. Pt verbalized understanding. Pt declined cardiac surgery discharge video. Pt agrees to phase 2 cardiac rehab referral, Dr. Saralyn Pilar is cardiologist, will send letter of interest to [facility]. Pt to recliner after walk, feet elevated, call bell within reach. Will follow.   [id number] Lenna Sciara, RN, BSN 02/26/2017 12:54 PM

## 2017-02-27 ENCOUNTER — Encounter (HOSPITAL_COMMUNITY): Payer: Medicare Other

## 2017-02-27 MED ORDER — LISINOPRIL 5 MG PO TABS
5.0000 mg | ORAL_TABLET | Freq: Every day | ORAL | Status: DC
  Administered 2017-02-27: 5 mg via ORAL
  Filled 2017-02-27: qty 1

## 2017-02-27 MED ORDER — METOPROLOL TARTRATE 25 MG PO TABS
37.5000 mg | ORAL_TABLET | Freq: Two times a day (BID) | ORAL | Status: DC
  Administered 2017-02-27 – 2017-02-28 (×2): 37.5 mg via ORAL
  Filled 2017-02-27 (×3): qty 1

## 2017-02-27 MED FILL — Heparin Sodium (Porcine) Inj 1000 Unit/ML: INTRAMUSCULAR | Qty: 30 | Status: AC

## 2017-02-27 MED FILL — Potassium Chloride Inj 2 mEq/ML: INTRAVENOUS | Qty: 40 | Status: AC

## 2017-02-27 MED FILL — Magnesium Sulfate Inj 50%: INTRAMUSCULAR | Qty: 10 | Status: AC

## 2017-02-27 NOTE — Progress Notes (Signed)
Pt refused AM CBG check stating that they have been fine every time and MD told her they were going to D/C ACHS check. Isac Caddy, RN

## 2017-02-27 NOTE — Progress Notes (Signed)
DAILY PROGRESS NOTE   Patient Name: Stacey [name] Date of Encounter: 02/27/2017  Primary Cardiologist :  [facility] Problem List   Active Problems:   S/P CABG x 4   PAF (paroxysmal atrial fibrillation) [facility])    Chief Complaint   No chest pain palpitations or dyspnea   Subjective   No complaints   Objective   Vitals:   02/27/17 0012 02/27/17 0353 02/27/17 0835 02/27/17 0940  BP: 109/74 131/79  123/73  Pulse: 99   (!) 118  Resp: 16 15    Temp: 98.9 F (37.2 C) 98 F (36.7 C)    TempSrc: Oral Oral    SpO2: 95% 95% 92%   Weight:  180 lb 9.6 oz (81.9 kg)    Height:        Intake/Output Summary (Last 24 hours) at 02/27/17 1039 Last data filed at 02/27/17 0941  Gross per 24 hour  Intake              603 ml  Output                0 ml  Net              603 ml   Filed Weights   02/25/17 0500 02/26/17 0500 02/27/17 0353  Weight: 181 lb 3.5 oz (82.2 kg) 181 lb 11.2 oz (82.4 kg) 180 lb 9.6 oz (81.9 kg)    Physical Exam   Affect appropriate Healthy:  appears stated age 23: normal Neck supple with no adenopathy JVP normal no bruits no thyromegaly Lungs clear with no wheezing and good diaphragmatic motion Heart:  S1/S2 no murmur, no rub, gallop or click post sternotomy PMI normal Abdomen: benighn, BS positve, no tenderness, no AAA no bruit.  No HSM or HJR Distal pulses intact with no bruits No edema Neuro non-focal Skin warm and dry No muscular weakness   Inpatient Medications    Scheduled Meds: . amiodarone  200 mg Oral Q12H   Followed by  . [START ON 03/02/2017] amiodarone  200 mg Oral Daily  . apixaban  5 mg Oral BID  . aspirin EC  81 mg Oral Daily  . buPROPion  150 mg Oral Daily  . chlorhexidine gluconate (MEDLINE KIT)  15 mL Mouth Rinse BID  . escitalopram  10 mg Oral Daily  . fenofibrate  54 mg Oral Daily  . folic acid  1 mg Oral Daily  . furosemide  40 mg Oral Daily  . levalbuterol  0.63 mg Nebulization TID  .  levothyroxine  88 mcg Oral QAC breakfast  . lisinopril  5 mg Oral Daily  . metoprolol tartrate  25 mg Oral BID  . pantoprazole  40 mg Oral QAC breakfast  . potassium chloride  20 mEq Oral Daily  . sodium chloride flush  3 mL Intravenous Q12H    Continuous Infusions: . sodium chloride      PRN Meds: sodium chloride, acetaminophen, alum & mag hydroxide-simeth, bisacodyl **OR** bisacodyl, ondansetron **OR** ondansetron (ZOFRAN) IV, oxyCODONE, sodium chloride flush, traMADol   Labs   Results for orders placed or performed during the hospital encounter of 02/20/17 (from the past 48 hour(s))  Glucose, capillary     Status: Abnormal   Collection Time: 02/25/17 12:11 PM  Result Value Ref Range   Glucose-Capillary 110 (H) 65 - 99 mg/dL   Comment 1 Capillary Specimen    Comment 2 Notify RN   Basic metabolic panel  Status: Abnormal   Collection Time: 02/25/17  6:14 PM  Result Value Ref Range   Sodium 136 135 - 145 mmol/L   Potassium 4.3 3.5 - 5.1 mmol/L   Chloride 98 (L) 101 - 111 mmol/L   CO2 28 22 - 32 mmol/L   Glucose, Bld 115 (H) 65 - 99 mg/dL   BUN 16 6 - 20 mg/dL   Creatinine, Ser 6.14 0.44 - 1.00 mg/dL   Calcium 8.6 (L) 8.9 - 10.3 mg/dL   GFR calc non Af Amer 54 (L) >60 mL/min   GFR calc Af Amer >60 >60 mL/min    Comment: (NOTE) The eGFR has been calculated using the CKD EPI equation. This calculation has not been validated in all clinical situations. eGFR's persistently <60 mL/min signify possible Chronic Kidney Disease.    Anion gap 10 5 - 15  Glucose, capillary     Status: None   Collection Time: 02/25/17  9:38 PM  Result Value Ref Range   Glucose-Capillary 96 65 - 99 mg/dL  CBC     Status: Abnormal   Collection Time: 02/26/17  3:04 AM  Result Value Ref Range   WBC 5.8 4.0 - 10.5 K/uL   RBC 3.05 (L) 3.87 - 5.11 MIL/uL   Hemoglobin 8.7 (L) 12.0 - 15.0 g/dL   HCT 43.2 (L) 46.9 - 97.8 %   MCV 88.9 78.0 - 100.0 fL   MCH 28.5 26.0 - 34.0 pg   MCHC 32.1 30.0 -  36.0 g/dL   RDW 02.0 89.1 - 00.2 %   Platelets 216 150 - 400 K/uL  Basic metabolic panel     Status: Abnormal   Collection Time: 02/26/17  3:04 AM  Result Value Ref Range   Sodium 136 135 - 145 mmol/L   Potassium 4.1 3.5 - 5.1 mmol/L   Chloride 98 (L) 101 - 111 mmol/L   CO2 30 22 - 32 mmol/L   Glucose, Bld 93 65 - 99 mg/dL   BUN 15 6 - 20 mg/dL   Creatinine, Ser 6.28 0.44 - 1.00 mg/dL   Calcium 8.3 (L) 8.9 - 10.3 mg/dL   GFR calc non Af Amer 54 (L) >60 mL/min   GFR calc Af Amer >60 >60 mL/min    Comment: (NOTE) The eGFR has been calculated using the CKD EPI equation. This calculation has not been validated in all clinical situations. eGFR's persistently <60 mL/min signify possible Chronic Kidney Disease.    Anion gap 8 5 - 15  Glucose, capillary     Status: None   Collection Time: 02/26/17  6:25 AM  Result Value Ref Range   Glucose-Capillary 97 65 - 99 mg/dL  Glucose, capillary     Status: Abnormal   Collection Time: 02/26/17 12:32 PM  Result Value Ref Range   Glucose-Capillary 101 (H) 65 - 99 mg/dL   Comment 1 Notify RN    Comment 2 Document in Chart   Glucose, capillary     Status: Abnormal   Collection Time: 02/26/17  4:38 PM  Result Value Ref Range   Glucose-Capillary 116 (H) 65 - 99 mg/dL   Comment 1 Notify RN    Comment 2 Document in Chart   Glucose, capillary     Status: Abnormal   Collection Time: 02/26/17  9:27 PM  Result Value Ref Range   Glucose-Capillary 146 (H) 65 - 99 mg/dL    ECG   54/9/65 SR rate 82 no acute changes   Telemetry   A-fib with  rates up to 100s - Personally Reviewed  Radiology    Dg Chest 2 View  Result Date: 02/26/2017 CLINICAL DATA:  Check postoperative status EXAM: CHEST  2 VIEW COMPARISON:  08/30/2008 FINDINGS: Cardiac shadow is enlarged. Postsurgical changes are again noted. Aortic calcifications are seen. Improved aeration is noted bilaterally with only minimal left basilar atelectasis. Small pleural effusions are noted  posteriorly. No acute bony abnormality is noted. IMPRESSION: Minimal left basilar atelectasis and small effusions. Electronically Signed   By: Inez Catalina M.D.   On: 02/26/2017 06:55    Cardiac Studies   N/A  Assessment   Active Problems:   S/P CABG x 4   PAF (paroxysmal atrial fibrillation) (HCC)   Plan   1. CAD/CABG: sternum looks good pacing wires out doing well 2. PAF:  Increase lopressor to 50 bid Day 2 eliquis consider [place] In 3 weeks if she does not convert spontaneously Continue amiodarone 200 bid   ? Outpatient f/u with Dr Saralyn Pilar in [place]
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
## 2017-02-28 MED ORDER — APIXABAN 5 MG PO TABS: 5.0000 mg | ORAL_TABLET | Freq: Two times a day (BID) | ORAL | 1 refills | Status: DC

## 2017-02-28 MED ORDER — POTASSIUM CHLORIDE ER 20 MEQ PO TBCR: 20.0000 meq | EXTENDED_RELEASE_TABLET | Freq: Every day | ORAL | 0 refills | Status: DC

## 2017-02-28 MED ORDER — FUROSEMIDE 40 MG PO TABS: 40.0000 mg | ORAL_TABLET | Freq: Every day | ORAL | 0 refills | Status: DC

## 2017-02-28 MED ORDER — TRAMADOL HCL 50 MG PO TABS: 50.0000 mg | ORAL_TABLET | Freq: Four times a day (QID) | ORAL | 0 refills | Status: DC | PRN

## 2017-02-28 MED ORDER — METOPROLOL TARTRATE 25 MG PO TABS: 37.5000 mg | ORAL_TABLET | Freq: Two times a day (BID) | ORAL | Status: DC

## 2017-02-28 MED ORDER — ESCITALOPRAM OXALATE 10 MG PO TABS: 10.0000 mg | ORAL_TABLET | Freq: Every day | ORAL | 3 refills | Status: DC

## 2017-02-28 MED ORDER — METOPROLOL TARTRATE 50 MG PO TABS: 37.5000 mg | ORAL_TABLET | Freq: Two times a day (BID) | ORAL | 1 refills | Status: DC

## 2017-02-28 MED ORDER — BIOTENE DRY MOUTH MT LIQD: 15.0000 mL | OROMUCOSAL | Status: DC | PRN

## 2017-02-28 MED ORDER — LISINOPRIL 2.5 MG PO TABS
2.5000 mg | ORAL_TABLET | Freq: Every day | ORAL | Status: DC
  Administered 2017-02-28: 2.5 mg via ORAL
  Filled 2017-02-28: qty 1

## 2017-02-28 MED ORDER — ACETAMINOPHEN 325 MG PO TABS: 650.0000 mg | ORAL_TABLET | Freq: Four times a day (QID) | ORAL | Status: DC | PRN

## 2017-02-28 MED ORDER — AMIODARONE HCL 200 MG PO TABS: 200.0000 mg | ORAL_TABLET | Freq: Two times a day (BID) | ORAL | 1 refills | Status: DC

## 2017-02-28 MED ORDER — LISINOPRIL 2.5 MG PO TABS: ORAL_TABLET | ORAL | 1 refills | Status: DC

## 2017-02-28 MED ORDER — METOPROLOL TARTRATE 25 MG PO TABS: 37.5000 mg | ORAL_TABLET | Freq: Two times a day (BID) | ORAL | 1 refills | Status: DC

## 2017-02-28 MED ORDER — METOPROLOL TARTRATE 50 MG PO TABS: 50.0000 mg | ORAL_TABLET | Freq: Two times a day (BID) | ORAL | Status: DC

## 2017-02-28 MED ORDER — METOPROLOL TARTRATE 50 MG PO TABS: 50.0000 mg | ORAL_TABLET | Freq: Two times a day (BID) | ORAL | 1 refills | Status: DC

## 2017-02-28 NOTE — Progress Notes (Addendum)
      [address]       [place], [address]             [phone]        8 Days Post-Op Procedure(s) (LRB): CORONARY ARTERY BYPASS GRAFTING (CABG) x , three using left internal mammary artery to left anterior descending coronary artery and right greater saphenous vein harvested endoscopically to distal right and diagonal coronary arteries. (N/A) TRANSESOPHAGEAL ECHOCARDIOGRAM (TEE) (N/A)  Subjective: Patient's only complaint is dry mouth.  Objective: Vital signs in last 24 hours: Temp:  [97.8 F (36.6 C)-98.3 F (36.8 C)] 97.9 F (36.6 C) (10/07 0412) Pulse Rate:  [73-118] 95 (10/07 0412) Cardiac Rhythm: Atrial fibrillation (10/06 2030) Resp:  [14-22] 14 (10/07 0412) BP: (100-132)/(59-73) 111/72 (10/07 0412) SpO2:  [92 %-100 %] 94 % (10/07 0412) Weight:  [177 lb 12.8 oz (80.6 kg)] 177 lb 12.8 oz (80.6 kg) (10/07 0412)  Pre op weight 82.2 kg Current Weight  02/28/17 177 lb 12.8 oz (80.6 kg)      Intake/Output from previous day: 10/06 0701 - 10/07 0700 In: 723 [P.O.:720; I.V.:3] Out: -    Physical Exam:  Cardiovascular: IRRR IRRR Pulmonary: Clear to auscultation bilaterally Abdomen: Soft, non tender, bowel sounds present. Extremities: Mild bilateral lower extremity edema.  Wounds: Clean and dry.  No erythema or signs of infection.  Lab Results: CBC:  Recent Labs  02/26/17 0304  WBC 5.8  HGB 8.7*  HCT 27.1*  PLT 216   BMET:   Recent Labs  02/25/17 1814 02/26/17 0304  NA 136 136  K 4.3 4.1  CL 98* 98*  CO2 28 30  GLUCOSE 115* 93  BUN 16 15  CREATININE 1.00 1.00  CALCIUM 8.6* 8.3*    PT/INR:  Lab Results  Component Value Date   INR 1.10 02/25/2017   INR 1.38 02/20/2017   INR 1.06 02/19/2017   ABG:  INR: Will add last result for INR, ABG once components are confirmed Will add last 4 CBG results once components are confirmed  Assessment/Plan:  1. CV - S/p NSTEMI. A fib with CVR this am-has had PAF with CVR. On  Amiodarone 200 mg bid, Lopressor 37.5 mg bid, Lisinopril 5 mg daily, and Apixaban. Will decrease Lisinopril. Per cardiology, will increase Lopressor to 50 mg bid as HR occasionally in the mid 100's. . May need DCCV as outpatient if remains in a fib. 2.  Pulmonary - On room air. Encourage incentive spirometer 3. Volume Overload - On Lasix 40 mg daily 4.  Acute blood loss anemia - H and H yesterday 8.7 and 27.1 5. Biotene for dry mouth 6.Will ask case manager to assist with obtaining Eliquis. Will discharge as long as patient able to obtain Eliquis.  ZIMMERMAN,DONIELLE MPA-C 02/28/2017,7:06 AM

## 2017-02-28 NOTE — Care Management Note (Signed)
Case Management Note  Patient Details  Name: Stacey [name] MRN: [id number] Date of Birth: 1941/02/04  Subjective/Objective:  CM consult for assist with Benefit check for Eliquis; made provider aware we were unable to perform these on the weekend yet we could provide 30d free coupon and she could discuss approval/benefit check with PCP after discharge.                   Action/Plan:CM will sign off for now but will be available should additional discharge needs arise or disposition change.    Expected Discharge Date:                  Expected Discharge Plan:  [place]  In-House Referral:     Discharge planning Services  CM Consult, Medication Assistance (Coupons for Eliquis)  Post Acute Care Choice:  Home Health Choice offered to:  Spouse  DME Arranged:    DME Agency:     HH Arranged:  PT [place]:  [place]  Status of Service:  Completed, signed off  If discussed at [place] of Stay Meetings, dates discussed:    Additional Comments:  Delrae Sawyers, RN 02/28/2017, 9:55 AM

## 2017-02-28 NOTE — Progress Notes (Signed)
DAILY PROGRESS NOTE   Patient Name: Stacey [name] Date of Encounter: 02/28/2017  Primary Cardiologist :  [facility]/ [facility] Problem List   Active Problems:   S/P CABG x 4   PAF (paroxysmal atrial fibrillation) (HCC)    Chief Complaint   No chest pain palpitations or dyspnea Ambulating well in hall with walker. She will f/u in [place] with Dr Saralyn Pilar  Subjective   No complaints   Objective   Vitals:   02/27/17 1448 02/27/17 1947 02/27/17 1955 02/28/17 0412  BP:   132/64 111/72  Pulse:  100 83 95  Resp:  '20 16 14  '$ Temp:   97.8 F (36.6 C) 97.9 F (36.6 C)  TempSrc:   Oral Oral  SpO2: 92% 94% 100% 94%  Weight:    177 lb 12.8 oz (80.6 kg)  Height:        Intake/Output Summary (Last 24 hours) at 02/28/17 0820 Last data filed at 02/27/17 1829  Gross per 24 hour  Intake              723 ml  Output                0 ml  Net              723 ml   Filed Weights   02/26/17 0500 02/27/17 0353 02/28/17 0412  Weight: 181 lb 11.2 oz (82.4 kg) 180 lb 9.6 oz (81.9 kg) 177 lb 12.8 oz (80.6 kg)    Physical Exam   Affect appropriate Healthy:  appears stated age HEENT: normal Neck supple with no adenopathy JVP normal no bruits no thyromegaly Lungs clear with no wheezing and good diaphragmatic motion Heart:  S1/S2 no murmur, no rub, gallop or click post sternotomy PMI normal Abdomen: benighn, BS positve, no tenderness, no AAA no bruit.  No HSM or HJR Distal pulses intact with no bruits No edema Neuro non-focal Skin warm and dry No muscular weakness   Inpatient Medications    Scheduled Meds: . amiodarone  200 mg Oral Q12H  . apixaban  5 mg Oral BID  . aspirin EC  81 mg Oral Daily  . buPROPion  150 mg Oral Daily  . chlorhexidine gluconate (MEDLINE KIT)  15 mL Mouth Rinse BID  . escitalopram  10 mg Oral Daily  . fenofibrate  54 mg Oral Daily  . folic acid  1 mg Oral Daily  . furosemide  40 mg Oral Daily  . levalbuterol  0.63 mg Nebulization  TID  . levothyroxine  88 mcg Oral QAC breakfast  . lisinopril  2.5 mg Oral Daily  . metoprolol tartrate  37.5 mg Oral BID  . pantoprazole  40 mg Oral QAC breakfast  . potassium chloride  20 mEq Oral Daily  . sodium chloride flush  3 mL Intravenous Q12H    Continuous Infusions: . sodium chloride      PRN Meds: sodium chloride, acetaminophen, alum & mag hydroxide-simeth, antiseptic oral rinse, bisacodyl **OR** bisacodyl, ondansetron **OR** ondansetron (ZOFRAN) IV, oxyCODONE, sodium chloride flush, traMADol   Labs   Results for orders placed or performed during the hospital encounter of 02/20/17 (from the past 48 hour(s))  Glucose, capillary     Status: Abnormal   Collection Time: 02/26/17 12:32 PM  Result Value Ref Range   Glucose-Capillary 101 (H) 65 - 99 mg/dL   Comment 1 Notify RN    Comment 2 Document in Chart   Glucose, capillary  Status: Abnormal   Collection Time: 02/26/17  4:38 PM  Result Value Ref Range   Glucose-Capillary 116 (H) 65 - 99 mg/dL   Comment 1 Notify RN    Comment 2 Document in Chart   Glucose, capillary     Status: Abnormal   Collection Time: 02/26/17  9:27 PM  Result Value Ref Range   Glucose-Capillary 146 (H) 65 - 99 mg/dL    ECG   02/23/17 SR rate 82 no acute changes   Telemetry   A-fib with rates up to 100s - Personally Reviewed  Radiology    No results found.  Cardiac Studies   N/A  Assessment   Active Problems:   S/P CABG x 4   PAF (paroxysmal atrial fibrillation) (HCC)   Plan   1. CAD/CABG: sternum looks good pacing wires out doing well 2. PAF:  Increase lopressor to 50 bid Day 2 eliquis consider [place] In 3 weeks if she does not convert spontaneously Continue amiodarone 200 bid   Outpatient f/u with Dr Saralyn Pilar in [place]

## 2017-02-28 NOTE — Progress Notes (Signed)
Chest tube sutures removed, Iv discontinued, tele discontinued. Pt given discharge instructions and 7 prescriptions. All questions answered.

## 2017-02-28 NOTE — Plan of Care (Signed)
Problem: Pain Management: Goal: Pain level will decrease Outcome: Progressing Pain controlled at this time, requests for pain meds as needed

## 2017-03-02 DIAGNOSIS — Z7982 Long term (current) use of aspirin: Secondary | ICD-10-CM | POA: Diagnosis not present

## 2017-03-02 DIAGNOSIS — M797 Fibromyalgia: Secondary | ICD-10-CM | POA: Diagnosis not present

## 2017-03-02 DIAGNOSIS — F329 Major depressive disorder, single episode, unspecified: Secondary | ICD-10-CM | POA: Diagnosis not present

## 2017-03-02 DIAGNOSIS — Z48812 Encounter for surgical aftercare following surgery on the circulatory system: Secondary | ICD-10-CM | POA: Diagnosis not present

## 2017-03-02 DIAGNOSIS — J45909 Unspecified asthma, uncomplicated: Secondary | ICD-10-CM | POA: Diagnosis not present

## 2017-03-02 DIAGNOSIS — Z7901 Long term (current) use of anticoagulants: Secondary | ICD-10-CM | POA: Diagnosis not present

## 2017-03-02 DIAGNOSIS — Z87891 Personal history of nicotine dependence: Secondary | ICD-10-CM | POA: Diagnosis not present

## 2017-03-02 DIAGNOSIS — I10 Essential (primary) hypertension: Secondary | ICD-10-CM | POA: Diagnosis not present

## 2017-03-02 DIAGNOSIS — M1990 Unspecified osteoarthritis, unspecified site: Secondary | ICD-10-CM | POA: Diagnosis not present

## 2017-03-02 DIAGNOSIS — I219 Acute myocardial infarction, unspecified: Secondary | ICD-10-CM | POA: Diagnosis not present

## 2017-03-03 ENCOUNTER — Other Ambulatory Visit (INDEPENDENT_AMBULATORY_CARE_PROVIDER_SITE_OTHER): Payer: Medicare Other | Admitting: Emergency Medicine

## 2017-03-03 ENCOUNTER — Other Ambulatory Visit: Payer: Self-pay | Admitting: Emergency Medicine

## 2017-03-03 DIAGNOSIS — J45909 Unspecified asthma, uncomplicated: Secondary | ICD-10-CM | POA: Diagnosis not present

## 2017-03-03 DIAGNOSIS — R300 Dysuria: Secondary | ICD-10-CM | POA: Diagnosis not present

## 2017-03-03 DIAGNOSIS — I10 Essential (primary) hypertension: Secondary | ICD-10-CM | POA: Diagnosis not present

## 2017-03-03 DIAGNOSIS — M797 Fibromyalgia: Secondary | ICD-10-CM | POA: Diagnosis not present

## 2017-03-03 DIAGNOSIS — I219 Acute myocardial infarction, unspecified: Secondary | ICD-10-CM | POA: Diagnosis not present

## 2017-03-03 DIAGNOSIS — Z48812 Encounter for surgical aftercare following surgery on the circulatory system: Secondary | ICD-10-CM | POA: Diagnosis not present

## 2017-03-03 DIAGNOSIS — M1990 Unspecified osteoarthritis, unspecified site: Secondary | ICD-10-CM | POA: Diagnosis not present

## 2017-03-03 LAB — POCT URINALYSIS DIPSTICK
Bilirubin, UA: NEGATIVE
Glucose, UA: NEGATIVE
Ketones, UA: NEGATIVE
NITRITE UA: NEGATIVE
PH UA: 7.5 (ref 5.0–8.0)
PROTEIN UA: NEGATIVE
SPEC GRAV UA: 1.01 (ref 1.010–1.025)
UROBILINOGEN UA: 0.2 U/dL

## 2017-03-03 NOTE — Addendum Note (Signed)
Addended by: Ermalinda Barrios on: 03/03/2017 01:54 PM   Modules accepted: Orders

## 2017-03-04 ENCOUNTER — Telehealth: Payer: Self-pay | Admitting: Family Medicine

## 2017-03-04 DIAGNOSIS — J45909 Unspecified asthma, uncomplicated: Secondary | ICD-10-CM | POA: Diagnosis not present

## 2017-03-04 DIAGNOSIS — I219 Acute myocardial infarction, unspecified: Secondary | ICD-10-CM | POA: Diagnosis not present

## 2017-03-04 DIAGNOSIS — I10 Essential (primary) hypertension: Secondary | ICD-10-CM | POA: Diagnosis not present

## 2017-03-04 DIAGNOSIS — M1990 Unspecified osteoarthritis, unspecified site: Secondary | ICD-10-CM | POA: Diagnosis not present

## 2017-03-04 DIAGNOSIS — Z48812 Encounter for surgical aftercare following surgery on the circulatory system: Secondary | ICD-10-CM | POA: Diagnosis not present

## 2017-03-04 DIAGNOSIS — M797 Fibromyalgia: Secondary | ICD-10-CM | POA: Diagnosis not present

## 2017-03-04 MED ORDER — NITROFURANTOIN MONOHYD MACRO 100 MG PO CAPS: 100.0000 mg | ORAL_CAPSULE | Freq: Two times a day (BID) | ORAL | 0 refills | Status: DC

## 2017-03-04 NOTE — Telephone Encounter (Signed)
Done-Averie Hornbaker V Montavius Subramaniam, RMA Try Nitrofurantoin 100mg  BID for 5 days. Pt just had CABG.

## 2017-03-04 NOTE — Telephone Encounter (Signed)
Pt's friend Kennyth Lose stated that pt got up this morning and she has blood in urine. Kennyth Lose is requesting a nurse return her call to discuss what they should try. [facility] [address]. Please advise. Thanks TNP

## 2017-03-04 NOTE — Telephone Encounter (Signed)
Spoke with patient - see lab result note also, advised patient will start her on antibiotic while waiting on urine culture to come back. Could not call back to the friend because she is not on DPR.Kris Mouton, RMA

## 2017-03-05 DIAGNOSIS — Z48812 Encounter for surgical aftercare following surgery on the circulatory system: Secondary | ICD-10-CM | POA: Diagnosis not present

## 2017-03-05 DIAGNOSIS — M797 Fibromyalgia: Secondary | ICD-10-CM | POA: Diagnosis not present

## 2017-03-05 DIAGNOSIS — M1990 Unspecified osteoarthritis, unspecified site: Secondary | ICD-10-CM | POA: Diagnosis not present

## 2017-03-05 DIAGNOSIS — I10 Essential (primary) hypertension: Secondary | ICD-10-CM | POA: Diagnosis not present

## 2017-03-05 DIAGNOSIS — J45909 Unspecified asthma, uncomplicated: Secondary | ICD-10-CM | POA: Diagnosis not present

## 2017-03-05 DIAGNOSIS — I219 Acute myocardial infarction, unspecified: Secondary | ICD-10-CM | POA: Diagnosis not present

## 2017-03-06 LAB — URINE CULTURE
MICRO NUMBER:: 81130485
SPECIMEN QUALITY:: ADEQUATE

## 2017-03-08 ENCOUNTER — Telehealth: Payer: Self-pay

## 2017-03-08 DIAGNOSIS — I10 Essential (primary) hypertension: Secondary | ICD-10-CM | POA: Diagnosis not present

## 2017-03-08 DIAGNOSIS — I219 Acute myocardial infarction, unspecified: Secondary | ICD-10-CM | POA: Diagnosis not present

## 2017-03-08 DIAGNOSIS — J45909 Unspecified asthma, uncomplicated: Secondary | ICD-10-CM | POA: Diagnosis not present

## 2017-03-08 DIAGNOSIS — M797 Fibromyalgia: Secondary | ICD-10-CM | POA: Diagnosis not present

## 2017-03-08 DIAGNOSIS — M1990 Unspecified osteoarthritis, unspecified site: Secondary | ICD-10-CM | POA: Diagnosis not present

## 2017-03-08 DIAGNOSIS — Z48812 Encounter for surgical aftercare following surgery on the circulatory system: Secondary | ICD-10-CM | POA: Diagnosis not present

## 2017-03-08 NOTE — Telephone Encounter (Signed)
Pt advised.   Thanks,   -Kensey Luepke  

## 2017-03-08 NOTE — Telephone Encounter (Signed)
-----   Message from [name], MD sent at 03/07/2017  1:21 PM EDT ----- Pt covered with macrobid.

## 2017-03-09 DIAGNOSIS — I219 Acute myocardial infarction, unspecified: Secondary | ICD-10-CM | POA: Diagnosis not present

## 2017-03-09 DIAGNOSIS — Z48812 Encounter for surgical aftercare following surgery on the circulatory system: Secondary | ICD-10-CM | POA: Diagnosis not present

## 2017-03-09 DIAGNOSIS — I10 Essential (primary) hypertension: Secondary | ICD-10-CM | POA: Diagnosis not present

## 2017-03-09 DIAGNOSIS — M1990 Unspecified osteoarthritis, unspecified site: Secondary | ICD-10-CM | POA: Diagnosis not present

## 2017-03-09 DIAGNOSIS — M797 Fibromyalgia: Secondary | ICD-10-CM | POA: Diagnosis not present

## 2017-03-09 DIAGNOSIS — J45909 Unspecified asthma, uncomplicated: Secondary | ICD-10-CM | POA: Diagnosis not present

## 2017-03-10 ENCOUNTER — Telehealth: Payer: Self-pay | Admitting: Family Medicine

## 2017-03-10 DIAGNOSIS — M797 Fibromyalgia: Secondary | ICD-10-CM | POA: Diagnosis not present

## 2017-03-10 DIAGNOSIS — I219 Acute myocardial infarction, unspecified: Secondary | ICD-10-CM | POA: Diagnosis not present

## 2017-03-10 DIAGNOSIS — J45909 Unspecified asthma, uncomplicated: Secondary | ICD-10-CM | POA: Diagnosis not present

## 2017-03-10 DIAGNOSIS — Z48812 Encounter for surgical aftercare following surgery on the circulatory system: Secondary | ICD-10-CM | POA: Diagnosis not present

## 2017-03-10 DIAGNOSIS — M1990 Unspecified osteoarthritis, unspecified site: Secondary | ICD-10-CM | POA: Diagnosis not present

## 2017-03-10 DIAGNOSIS — I10 Essential (primary) hypertension: Secondary | ICD-10-CM | POA: Diagnosis not present

## 2017-03-10 NOTE — Telephone Encounter (Signed)
Noted and pharmacy Harolyn Rutherford, RMA

## 2017-03-10 NOTE — Telephone Encounter (Signed)
FYI: [employer] faxed notice that the prior auth for  zolpidem (AMBIEN) 10 MG tablet was approved. The document has been attached in chart. Please advise. Thanks TNP

## 2017-03-11 DIAGNOSIS — Z48812 Encounter for surgical aftercare following surgery on the circulatory system: Secondary | ICD-10-CM | POA: Diagnosis not present

## 2017-03-11 DIAGNOSIS — M797 Fibromyalgia: Secondary | ICD-10-CM | POA: Diagnosis not present

## 2017-03-11 DIAGNOSIS — M1990 Unspecified osteoarthritis, unspecified site: Secondary | ICD-10-CM | POA: Diagnosis not present

## 2017-03-11 DIAGNOSIS — I219 Acute myocardial infarction, unspecified: Secondary | ICD-10-CM | POA: Diagnosis not present

## 2017-03-11 DIAGNOSIS — J45909 Unspecified asthma, uncomplicated: Secondary | ICD-10-CM | POA: Diagnosis not present

## 2017-03-11 DIAGNOSIS — I10 Essential (primary) hypertension: Secondary | ICD-10-CM | POA: Diagnosis not present

## 2017-03-12 DIAGNOSIS — M797 Fibromyalgia: Secondary | ICD-10-CM | POA: Diagnosis not present

## 2017-03-12 DIAGNOSIS — I219 Acute myocardial infarction, unspecified: Secondary | ICD-10-CM | POA: Diagnosis not present

## 2017-03-12 DIAGNOSIS — Z48812 Encounter for surgical aftercare following surgery on the circulatory system: Secondary | ICD-10-CM | POA: Diagnosis not present

## 2017-03-12 DIAGNOSIS — J45909 Unspecified asthma, uncomplicated: Secondary | ICD-10-CM | POA: Diagnosis not present

## 2017-03-12 DIAGNOSIS — M1990 Unspecified osteoarthritis, unspecified site: Secondary | ICD-10-CM | POA: Diagnosis not present

## 2017-03-12 DIAGNOSIS — I10 Essential (primary) hypertension: Secondary | ICD-10-CM | POA: Diagnosis not present

## 2017-03-15 DIAGNOSIS — E782 Mixed hyperlipidemia: Secondary | ICD-10-CM | POA: Diagnosis not present

## 2017-03-15 DIAGNOSIS — I10 Essential (primary) hypertension: Secondary | ICD-10-CM | POA: Diagnosis not present

## 2017-03-15 DIAGNOSIS — I25810 Atherosclerosis of coronary artery bypass graft(s) without angina pectoris: Secondary | ICD-10-CM | POA: Insufficient documentation

## 2017-03-15 DIAGNOSIS — I480 Paroxysmal atrial fibrillation: Secondary | ICD-10-CM | POA: Diagnosis not present

## 2017-03-16 DIAGNOSIS — I10 Essential (primary) hypertension: Secondary | ICD-10-CM | POA: Diagnosis not present

## 2017-03-16 DIAGNOSIS — J45909 Unspecified asthma, uncomplicated: Secondary | ICD-10-CM | POA: Diagnosis not present

## 2017-03-16 DIAGNOSIS — M797 Fibromyalgia: Secondary | ICD-10-CM | POA: Diagnosis not present

## 2017-03-16 DIAGNOSIS — Z48812 Encounter for surgical aftercare following surgery on the circulatory system: Secondary | ICD-10-CM | POA: Diagnosis not present

## 2017-03-16 DIAGNOSIS — M1990 Unspecified osteoarthritis, unspecified site: Secondary | ICD-10-CM | POA: Diagnosis not present

## 2017-03-16 DIAGNOSIS — I219 Acute myocardial infarction, unspecified: Secondary | ICD-10-CM | POA: Diagnosis not present

## 2017-03-18 DIAGNOSIS — J45909 Unspecified asthma, uncomplicated: Secondary | ICD-10-CM | POA: Diagnosis not present

## 2017-03-18 DIAGNOSIS — M1990 Unspecified osteoarthritis, unspecified site: Secondary | ICD-10-CM | POA: Diagnosis not present

## 2017-03-18 DIAGNOSIS — I219 Acute myocardial infarction, unspecified: Secondary | ICD-10-CM | POA: Diagnosis not present

## 2017-03-18 DIAGNOSIS — M797 Fibromyalgia: Secondary | ICD-10-CM | POA: Diagnosis not present

## 2017-03-18 DIAGNOSIS — Z48812 Encounter for surgical aftercare following surgery on the circulatory system: Secondary | ICD-10-CM | POA: Diagnosis not present

## 2017-03-18 DIAGNOSIS — I10 Essential (primary) hypertension: Secondary | ICD-10-CM | POA: Diagnosis not present

## 2017-03-19 DIAGNOSIS — I219 Acute myocardial infarction, unspecified: Secondary | ICD-10-CM | POA: Diagnosis not present

## 2017-03-19 DIAGNOSIS — Z48812 Encounter for surgical aftercare following surgery on the circulatory system: Secondary | ICD-10-CM | POA: Diagnosis not present

## 2017-03-19 DIAGNOSIS — M1990 Unspecified osteoarthritis, unspecified site: Secondary | ICD-10-CM | POA: Diagnosis not present

## 2017-03-19 DIAGNOSIS — I10 Essential (primary) hypertension: Secondary | ICD-10-CM | POA: Diagnosis not present

## 2017-03-19 DIAGNOSIS — J45909 Unspecified asthma, uncomplicated: Secondary | ICD-10-CM | POA: Diagnosis not present

## 2017-03-19 DIAGNOSIS — M797 Fibromyalgia: Secondary | ICD-10-CM | POA: Diagnosis not present

## 2017-03-22 ENCOUNTER — Encounter: Payer: Self-pay | Admitting: Family Medicine

## 2017-03-22 ENCOUNTER — Ambulatory Visit (INDEPENDENT_AMBULATORY_CARE_PROVIDER_SITE_OTHER): Payer: Medicare Other | Admitting: Family Medicine

## 2017-03-22 DIAGNOSIS — F3342 Major depressive disorder, recurrent, in full remission: Secondary | ICD-10-CM | POA: Diagnosis not present

## 2017-03-22 DIAGNOSIS — F5101 Primary insomnia: Secondary | ICD-10-CM

## 2017-03-22 MED ORDER — BUPROPION HCL ER (XL) 300 MG PO TB24: 300.0000 mg | ORAL_TABLET | Freq: Every day | ORAL | 3 refills | Status: DC

## 2017-03-22 MED ORDER — ROSUVASTATIN CALCIUM 10 MG PO TABS: 10.0000 mg | ORAL_TABLET | Freq: Every day | ORAL | 3 refills | Status: DC

## 2017-03-22 MED ORDER — ZOLPIDEM TARTRATE 10 MG PO TABS: 10.0000 mg | ORAL_TABLET | Freq: Every evening | ORAL | 1 refills | Status: DC | PRN

## 2017-03-22 NOTE — Progress Notes (Signed)
Patient: Stacey [name] Female    DOB: 12/02/1940   76 y.o.   MRN: [id number] Visit Date: 03/22/2017  Today's Provider: Wilhemena Durie, MD   Chief Complaint  Patient presents with  . Hospitalization Follow-up   Subjective:    HPI  Pt is here today for a hospital follow up. She had a CABG X4. She went to the hospital with chest pain on 02/19/17, had cardiac cath and was transported to [facility] for Urgent CABG. She had CABG on 01/2917 by Dr. Servando Snare. She was started on Amiodarone, Eliquis, Metoprolol, and lasix. Stopped meloxicam, Bystolic and Prednisone.  Pt looks to be doing well from the surgery. She says that she is hurting in her back across the bra line. She also feels depressed. She was told to stop taking her Lexapro due to drug interactions. Her UTI has cleared up.   Depression screen [facility] 2/9 03/22/2017 12/17/2016 12/17/2016 12/04/2015 11/30/2014  Decreased Interest 3 0 0 0 1  Down, Depressed, Hopeless 3 0 0 0 1  PHQ - 2 Score 6 0 0 0 2  Altered sleeping 2 2 - - 3  Tired, decreased energy 3 2 - - 3  Change in appetite 2 2 - - 0  Feeling bad or failure about yourself  3 0 - - 2  Trouble concentrating 2 0 - - 2  Moving slowly or fidgety/restless 1 0 - - 0  Suicidal thoughts 0 0 - - 0  PHQ-9 Score 19 6 - - 12  Difficult doing work/chores Extremely dIfficult - - - Very difficult        Allergies  Allergen Reactions  . Cephalexin Anaphylaxis  . Atorvastatin   . Morphine And Related Hives  . Novocain [Procaine]     Chest pain  . Shellfish Allergy Swelling    angioedema  . Statins Other (See Comments)    Muscle pain  . Tomato     (by testing) - also beans, wheat, peas  . Penicillins Hives and Rash     Current Outpatient Prescriptions:  .  acetaminophen (TYLENOL) 325 MG tablet, Take 2 tablets (650 mg total) by mouth every 6 (six) hours as needed for mild pain., Disp: , Rfl:  .  albuterol (VENTOLIN HFA) 108 (90 Base) MCG/ACT inhaler, INHALE 2 PUFFS BY  MOUTH EVERY 4 TO 6 HOURS AS NEEDED FOR SHORTNESS OF BREATH, Disp: 18 g, Rfl: 5 .  amiodarone (PACERONE) 200 MG tablet, Take 1 tablet (200 mg total) by mouth every 12 (twelve) hours. For one week then take Amiodarone 200 mg by mouth daily thereafter., Disp: 60 tablet, Rfl: 1 .  apixaban (ELIQUIS) 5 MG TABS tablet, Take 1 tablet (5 mg total) by mouth 2 (two) times daily., Disp: 60 tablet, Rfl: 1 .  aspirin 81 MG tablet, ASPIRIN, 81MG  (Oral Tablet Delayed Release)  1 Every Day for 0 days  Quantity: 0.00;  Refills: 0   Ordered :17-November-2010  [name] ;  Started 05-Oct-2006 Active Comments: DX: 401.9, Disp: , Rfl:  .  buPROPion (WELLBUTRIN XL) 150 MG 24 hr tablet, Take 1 tablet (150 mg total) by mouth daily., Disp: 30 tablet, Rfl: 12 .  Cholecalciferol (VITAMIN D) 2000 units tablet, Take 2,000 Units by mouth daily., Disp: , Rfl:  .  Cyanocobalamin (B-12) 500 MCG TABS, Take by mouth., Disp: , Rfl:  .  diazepam (VALIUM) 10 MG tablet, Take 1 tablet (10 mg total) by mouth as needed., Disp: 90  tablet, Rfl: 1 .  EPINEPHrine (EPIPEN 2-PAK) 0.3 mg/0.3 mL IJ SOAJ injection, INJECT AS DIRECTED FOR SEVERE ALLERGIC REACTION, Disp: 1 Device, Rfl: 1 .  levothyroxine (SYNTHROID, LEVOTHROID) 88 MCG tablet, Take 1 tablet (88 mcg total) by mouth daily., Disp: 90 tablet, Rfl: 3 .  lisinopril (PRINIVIL,ZESTRIL) 2.5 MG tablet, Take 2.5 mg by mouth daily., Disp: 30 tablet, Rfl: 1 .  metoprolol tartrate (LOPRESSOR) 25 MG tablet, Take 1.5 tablets (37.5 mg total) by mouth 2 (two) times daily., Disp: 60 tablet, Rfl: 1 .  Multiple Vitamin (MULTI-VITAMINS) TABS, Take 1 tablet by mouth daily., Disp: , Rfl:  .  traMADol (ULTRAM) 50 MG tablet, Take 1 tablet (50 mg total) by mouth every 6 (six) hours as needed for moderate pain., Disp: 30 tablet, Rfl: 0 .  zolpidem (AMBIEN) 10 MG tablet, Take 1 tablet (10 mg total) by mouth at bedtime as needed. for sleep, Disp: 90 tablet, Rfl: 1 .  escitalopram (LEXAPRO) 10 MG tablet, Take 1 tablet  (10 mg total) by mouth daily. (Patient not taking: Reported on 03/22/2017), Disp: 90 tablet, Rfl: 3 .  fenofibrate (TRICOR) 145 MG tablet, Take 1 tablet by mouth daily., Disp: , Rfl: 1 .  furosemide (LASIX) 40 MG tablet, Take 1 tablet (40 mg total) by mouth daily. For 4 days then stop. (Patient not taking: Reported on 03/22/2017), Disp: 30 tablet, Rfl: 0 .  nitrofurantoin, macrocrystal-monohydrate, (MACROBID) 100 MG capsule, Take 1 capsule (100 mg total) by mouth 2 (two) times daily. (Patient not taking: Reported on 03/22/2017), Disp: 10 capsule, Rfl: 0 .  potassium chloride 20 MEQ TBCR, Take 20 mEq by mouth daily. For 4 days then stop. (Patient not taking: Reported on 03/22/2017), Disp: 4 tablet, Rfl: 0  Review of Systems  Constitutional: Positive for fatigue.  HENT: Negative.   Eyes: Negative.   Respiratory: Negative.   Cardiovascular: Negative.   Gastrointestinal: Negative.   Endocrine: Negative.   Genitourinary: Negative.   Musculoskeletal: Positive for back pain.  Skin: Negative.   Allergic/Immunologic: Negative.   Neurological: Negative.   Hematological: Negative.   Psychiatric/Behavioral: Positive for dysphoric mood.    Social History  Substance Use Topics  . Smoking status: Former Smoker    Quit date: 05/26/1975  . Smokeless tobacco: Never Used     Comment: quit 45  years ago   . Alcohol use No   Objective:   BP (!) 148/88 (BP Location: Left Arm, Patient Position: Sitting, Cuff Size: Normal)   Pulse 78   Temp 97.7 F (36.5 C) (Oral)   Resp 16   Wt 170 lb (77.1 kg)   SpO2 97%   BMI 31.09 kg/m  Vitals:   03/22/17 1535  BP: (!) 148/88  Pulse: 78  Resp: 16  Temp: 97.7 F (36.5 C)  TempSrc: Oral  SpO2: 97%  Weight: 170 lb (77.1 kg)     Physical Exam  Constitutional: She is oriented to person, place, and time. She appears well-developed and well-nourished.  HENT:  Head: Normocephalic and atraumatic.  Right Ear: External ear normal.  Left Ear: External ear  normal.  Nose: Nose normal.  Mouth/Throat: Oropharynx is clear and moist.  Eyes: Pupils are equal, round, and reactive to light. Conjunctivae are normal. No scleral icterus.  Neck: No thyromegaly present.  Cardiovascular: Normal rate, regular rhythm and normal heart sounds.   Pulmonary/Chest: Effort normal and breath sounds normal.  Abdominal: Soft.  Neurological: She is alert and oriented to person, place, and time.  Skin:  Skin is warm and dry.  Psychiatric: She has a normal mood and affect. Her behavior is normal. Judgment and thought content normal.        Assessment & Plan:     1. Recurrent major depressive disorder, in full remission ([place])  - buPROPion (WELLBUTRIN XL) 300 MG 24 hr tablet; Take 1 tablet (300 mg total) by mouth daily.  Dispense: 90 tablet; Refill: 3  2. Primary insomnia  - zolpidem (AMBIEN) 10 MG tablet; Take 1 tablet (10 mg total) by mouth at bedtime as needed. for sleep  Dispense: 90 tablet; Refill: 1 3.CAD/s/p CABG 4.HLD Try crestor 5 as he has been statin intolerant in past.  I have done the exam and reviewed the chart and it is accurate to the best of my knowledge. Development worker, community has been used and  any errors in dictation or transcription are unintentional. Miguel Aschoff M.D. [place], MD  [facility]

## 2017-03-23 DIAGNOSIS — J45909 Unspecified asthma, uncomplicated: Secondary | ICD-10-CM | POA: Diagnosis not present

## 2017-03-23 DIAGNOSIS — M797 Fibromyalgia: Secondary | ICD-10-CM | POA: Diagnosis not present

## 2017-03-23 DIAGNOSIS — I10 Essential (primary) hypertension: Secondary | ICD-10-CM | POA: Diagnosis not present

## 2017-03-23 DIAGNOSIS — M1990 Unspecified osteoarthritis, unspecified site: Secondary | ICD-10-CM | POA: Diagnosis not present

## 2017-03-23 DIAGNOSIS — I219 Acute myocardial infarction, unspecified: Secondary | ICD-10-CM | POA: Diagnosis not present

## 2017-03-23 DIAGNOSIS — Z48812 Encounter for surgical aftercare following surgery on the circulatory system: Secondary | ICD-10-CM | POA: Diagnosis not present

## 2017-03-24 ENCOUNTER — Telehealth: Payer: Self-pay | Admitting: Physician Assistant

## 2017-03-24 NOTE — Telephone Encounter (Signed)
Please review-Stacey [name], RMA  

## 2017-03-24 NOTE — Telephone Encounter (Signed)
Correct, no Celebrex

## 2017-03-24 NOTE — Telephone Encounter (Signed)
Pharmacy request for celebrex. Patient should not be taking celebrex while taking eliquis.

## 2017-03-24 NOTE — Telephone Encounter (Signed)
Left message informing pharmacy.

## 2017-03-25 DIAGNOSIS — J45909 Unspecified asthma, uncomplicated: Secondary | ICD-10-CM | POA: Diagnosis not present

## 2017-03-25 DIAGNOSIS — I10 Essential (primary) hypertension: Secondary | ICD-10-CM | POA: Diagnosis not present

## 2017-03-25 DIAGNOSIS — M797 Fibromyalgia: Secondary | ICD-10-CM | POA: Diagnosis not present

## 2017-03-25 DIAGNOSIS — Z48812 Encounter for surgical aftercare following surgery on the circulatory system: Secondary | ICD-10-CM | POA: Diagnosis not present

## 2017-03-25 DIAGNOSIS — M1990 Unspecified osteoarthritis, unspecified site: Secondary | ICD-10-CM | POA: Diagnosis not present

## 2017-03-25 DIAGNOSIS — I219 Acute myocardial infarction, unspecified: Secondary | ICD-10-CM | POA: Diagnosis not present

## 2017-03-26 ENCOUNTER — Other Ambulatory Visit: Payer: Self-pay | Admitting: Cardiothoracic Surgery

## 2017-03-26 DIAGNOSIS — Z951 Presence of aortocoronary bypass graft: Secondary | ICD-10-CM

## 2017-03-29 ENCOUNTER — Ambulatory Visit (INDEPENDENT_AMBULATORY_CARE_PROVIDER_SITE_OTHER): Payer: Self-pay | Admitting: Physician Assistant

## 2017-03-29 ENCOUNTER — Ambulatory Visit
Admission: RE | Admit: 2017-03-29 | Discharge: 2017-03-29 | Disposition: A | Discharge status: Home / Self Care | Payer: Medicare Other | Source: Ambulatory Visit | Attending: Cardiothoracic Surgery | Admitting: Cardiothoracic Surgery

## 2017-03-29 DIAGNOSIS — I10 Essential (primary) hypertension: Secondary | ICD-10-CM

## 2017-03-29 DIAGNOSIS — J90 Pleural effusion, not elsewhere classified: Secondary | ICD-10-CM | POA: Diagnosis not present

## 2017-03-29 DIAGNOSIS — Z951 Presence of aortocoronary bypass graft: Secondary | ICD-10-CM

## 2017-03-29 MED ORDER — LISINOPRIL 5 MG PO TABS: ORAL_TABLET | ORAL | 1 refills | Status: DC

## 2017-03-29 NOTE — Progress Notes (Signed)
HPI: Patient returns for routine postoperative follow-up having undergone Emergent CABG x 3 on 02/20/2017 The patient's early postoperative recovery while in the hospital was notable for Atrial fibrillation requiring Eliqius. Since hospital discharge the patient reports she is doing very well.  She is ambulating independently without difficulty.  She continues to have some mild back discomfort.  Her incisions are healing without difficulty.    Current Outpatient Medications  Medication Sig Dispense Refill  . acetaminophen (TYLENOL) 325 MG tablet Take 2 tablets (650 mg total) by mouth every 6 (six) hours as needed for mild pain.    Marland Kitchen amiodarone (PACERONE) 200 MG tablet Take 1 tablet (200 mg total) by mouth every 12 (twelve) hours. For one week then take Amiodarone 200 mg by mouth daily thereafter. 60 tablet 1  . apixaban (ELIQUIS) 5 MG TABS tablet Take 1 tablet (5 mg total) by mouth 2 (two) times daily. 60 tablet 1  . aspirin 81 MG tablet ASPIRIN, 81MG  (Oral Tablet Delayed Release)  1 Every Day for 0 days  Quantity: 0.00;  Refills: 0   Ordered :17-November-2010  [name] ;  Started 05-Oct-2006 Active Comments: DX: 401.9    . buPROPion (WELLBUTRIN XL) 300 MG 24 hr tablet Take 1 tablet (300 mg total) by mouth daily. 90 tablet 3  . Cholecalciferol (VITAMIN D) 2000 units tablet Take 2,000 Units by mouth daily.    . Cyanocobalamin (B-12) 500 MCG TABS Take by mouth.    . diazepam (VALIUM) 10 MG tablet Take 1 tablet (10 mg total) by mouth as needed. 90 tablet 1  . EPINEPHrine (EPIPEN 2-PAK) 0.3 mg/0.3 mL IJ SOAJ injection INJECT AS DIRECTED FOR SEVERE ALLERGIC REACTION 1 Device 1  . levothyroxine (SYNTHROID, LEVOTHROID) 88 MCG tablet Take 1 tablet (88 mcg total) by mouth daily. 90 tablet 3  . lisinopril (PRINIVIL,ZESTRIL) 5 MG tablet Take 2.5 mg by mouth daily. 30 tablet 1  . metoprolol tartrate (LOPRESSOR) 25 MG tablet Take 1.5 tablets (37.5 mg total) by mouth 2 (two) times daily. 60 tablet 1  . Multiple  Vitamin (MULTI-VITAMINS) TABS Take 1 tablet by mouth daily.    . rosuvastatin (CRESTOR) 10 MG tablet Take 1 tablet (10 mg total) by mouth daily. 90 tablet 3  . traMADol (ULTRAM) 50 MG tablet Take 1 tablet (50 mg total) by mouth every 6 (six) hours as needed for moderate pain. 30 tablet 0  . zolpidem (AMBIEN) 10 MG tablet Take 1 tablet (10 mg total) by mouth at bedtime as needed. for sleep 90 tablet 1  . albuterol (VENTOLIN HFA) 108 (90 Base) MCG/ACT inhaler INHALE 2 PUFFS BY MOUTH EVERY 4 TO 6 HOURS AS NEEDED FOR SHORTNESS OF BREATH (Patient not taking: Reported on 03/29/2017) 18 g 5  . fenofibrate (TRICOR) 145 MG tablet Take 1 tablet by mouth daily.  1   No current facility-administered medications for this visit.     Physical Exam:  BP (!) 159/100 (BP Location: Left Arm, Patient Position: Sitting, Cuff Size: Normal)   Pulse 83   Ht 5\' 2"  (1.575 m)   Wt 167 lb (75.8 kg)   SpO2 95%   BMI 30.54 kg/m   Gen: no apparent distress Heart: RRR Lungs: CTA bilaterally Abd: soft non-tender Ext: no edema Incisions: healing without evidence of infection  Diagnostic Tests:  CXR: sternal wires intact, minimal pleural effusions, + atelectasis  1. CV- S/P CABG x 3 doing well 2. Hypertension- BP 159/100 today, will increase Lisinopril to 5 mg daily 3.  PAF- only 1 episodes since discharge from hospital.. May be able to stop Amiodarone at Cardiology visit later this week... She does question if she will be able to get off Eliqius 4. Activity- increase as tolerated, re-iterated sternal precautions and lifting restrictions for several more weeks 5. RTC prn   Ellwood Handler, PA-C [facility] [phone]

## 2017-03-29 NOTE — Patient Instructions (Signed)
You may return to driving an automobile as long as you are no longer requiring oral narcotic pain relievers during the daytime.  It would be wise to start driving only short distances during the daylight and gradually increase from there as you feel comfortable.  Make every effort to maintain a "heart-healthy" lifestyle with regular physical exercise and adherence to a low-fat, low-carbohydrate diet.  Continue to seek regular follow-up appointments with your primary care physician and/or cardiologist.      

## 2017-03-30 DIAGNOSIS — I10 Essential (primary) hypertension: Secondary | ICD-10-CM | POA: Diagnosis not present

## 2017-03-30 DIAGNOSIS — I219 Acute myocardial infarction, unspecified: Secondary | ICD-10-CM | POA: Diagnosis not present

## 2017-03-30 DIAGNOSIS — M1990 Unspecified osteoarthritis, unspecified site: Secondary | ICD-10-CM | POA: Diagnosis not present

## 2017-03-30 DIAGNOSIS — M797 Fibromyalgia: Secondary | ICD-10-CM | POA: Diagnosis not present

## 2017-03-30 DIAGNOSIS — Z48812 Encounter for surgical aftercare following surgery on the circulatory system: Secondary | ICD-10-CM | POA: Diagnosis not present

## 2017-03-30 DIAGNOSIS — J45909 Unspecified asthma, uncomplicated: Secondary | ICD-10-CM | POA: Diagnosis not present

## 2017-03-31 DIAGNOSIS — E782 Mixed hyperlipidemia: Secondary | ICD-10-CM | POA: Diagnosis not present

## 2017-03-31 DIAGNOSIS — I10 Essential (primary) hypertension: Secondary | ICD-10-CM | POA: Diagnosis not present

## 2017-03-31 DIAGNOSIS — I25810 Atherosclerosis of coronary artery bypass graft(s) without angina pectoris: Secondary | ICD-10-CM | POA: Diagnosis not present

## 2017-03-31 DIAGNOSIS — I480 Paroxysmal atrial fibrillation: Secondary | ICD-10-CM | POA: Diagnosis not present

## 2017-04-01 ENCOUNTER — Ambulatory Visit: Payer: Medicare Other | Admitting: Cardiothoracic Surgery

## 2017-04-04 ENCOUNTER — Other Ambulatory Visit: Payer: Self-pay | Admitting: Family Medicine

## 2017-04-04 DIAGNOSIS — I10 Essential (primary) hypertension: Secondary | ICD-10-CM

## 2017-04-04 MED ORDER — AMLODIPINE BESYLATE 5 MG PO TABS: 5.0000 mg | ORAL_TABLET | Freq: Every day | ORAL | 3 refills | Status: DC

## 2017-04-07 DIAGNOSIS — M1990 Unspecified osteoarthritis, unspecified site: Secondary | ICD-10-CM | POA: Diagnosis not present

## 2017-04-07 DIAGNOSIS — M797 Fibromyalgia: Secondary | ICD-10-CM | POA: Diagnosis not present

## 2017-04-07 DIAGNOSIS — J45909 Unspecified asthma, uncomplicated: Secondary | ICD-10-CM | POA: Diagnosis not present

## 2017-04-07 DIAGNOSIS — I219 Acute myocardial infarction, unspecified: Secondary | ICD-10-CM | POA: Diagnosis not present

## 2017-04-07 DIAGNOSIS — I10 Essential (primary) hypertension: Secondary | ICD-10-CM | POA: Diagnosis not present

## 2017-04-07 DIAGNOSIS — Z48812 Encounter for surgical aftercare following surgery on the circulatory system: Secondary | ICD-10-CM | POA: Diagnosis not present

## 2017-04-27 ENCOUNTER — Ambulatory Visit (INDEPENDENT_AMBULATORY_CARE_PROVIDER_SITE_OTHER): Payer: Medicare Other | Admitting: Family Medicine

## 2017-04-27 VITALS — BP 148/68 | HR 72 | Temp 98.1°F | Resp 14 | Wt 168.8 lb

## 2017-04-27 DIAGNOSIS — I25709 Atherosclerosis of coronary artery bypass graft(s), unspecified, with unspecified angina pectoris: Secondary | ICD-10-CM | POA: Diagnosis not present

## 2017-04-27 DIAGNOSIS — E7800 Pure hypercholesterolemia, unspecified: Secondary | ICD-10-CM | POA: Diagnosis not present

## 2017-04-27 DIAGNOSIS — E039 Hypothyroidism, unspecified: Secondary | ICD-10-CM

## 2017-04-27 DIAGNOSIS — F5101 Primary insomnia: Secondary | ICD-10-CM

## 2017-04-27 DIAGNOSIS — I10 Essential (primary) hypertension: Secondary | ICD-10-CM

## 2017-04-27 DIAGNOSIS — F3342 Major depressive disorder, recurrent, in full remission: Secondary | ICD-10-CM

## 2017-04-27 DIAGNOSIS — I209 Angina pectoris, unspecified: Secondary | ICD-10-CM | POA: Diagnosis not present

## 2017-04-27 MED ORDER — LISINOPRIL 40 MG PO TABS: 40.0000 mg | ORAL_TABLET | Freq: Every day | ORAL | 3 refills | Status: DC

## 2017-04-27 NOTE — Progress Notes (Signed)
Stacey [name]  MRN: [id number] DOB: [birth year]  Subjective:  HPI  Patient is here for follow up. Last office visit was on 03/22/17. Depression: Increased Wellbutrin to 300 mg on last visit. Patient states she is doing better she thinks on this dose. Depression screen [facility] 2/9 04/27/2017 03/22/2017 12/17/2016  Decreased Interest 1 3 0  Down, Depressed, Hopeless 2 3 0  PHQ - 2 Score 3 6 0  Altered sleeping 3 2 2   Tired, decreased energy 1 3 2   Change in appetite 1 2 2   Feeling bad or failure about yourself  2 3 0  Trouble concentrating 0 2 0  Moving slowly or fidgety/restless 0 1 0  Suicidal thoughts 0 0 0  PHQ-9 Score 10 19 6   Difficult doing work/chores Somewhat difficult Extremely dIfficult -   Hyperlipidemia: patient was started on Crestor 10 mg. Patient is taking this daily. Tolerating it so far. Lab Results  Component Value Date   CHOL 183 02/20/2017   HDL 58 02/20/2017   LDLCALC 106 (H) 02/20/2017   TRIG 96 02/20/2017   CHOLHDL 3.2 02/20/2017   HTN: patient was advised to increase Lisinopril 5 mg to 2 tablets in the morning and 1 tablet in the evening. Patient was also started on Amlodipine 5 mg daily. Patient states she has been checking her b/p and readings are up and down sometimes really high and then improves and sometimes gets low.  Patient has noticed swelling in her lower right leg and right foot. BP Readings from Last 3 Encounters:  04/27/17 (!) 148/68  03/29/17 (!) 159/100  03/22/17 (!) 148/88   Wt Readings from Last 3 Encounters:  04/27/17 168 lb 12.8 oz (76.6 kg)  03/29/17 167 lb (75.8 kg)  03/22/17 170 lb (77.1 kg)   Patient Active Problem List   Diagnosis Date Noted  . PAF (paroxysmal atrial fibrillation) ([place])   . Non-STEMI (non-ST elevated myocardial infarction) ([place]) 02/20/2017  . 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 Personal history of colonic polyps   . Benign neoplasm of ascending colon   . Benign neoplasm of descending colon   . Chest pain  09/16/2015  . Disequilibrium 09/16/2015  . Panic attacks 09/11/2015  . Ganglion cyst 08/16/2015  . Hypernatremia 07/04/2015  . Upper back pain 07/04/2015  . Other osteoarthritis involving multiple joints 02/06/2015  . Clinical depression 09/27/2014  . Dry mouth 09/27/2014  . Fibrositis 09/27/2014  . LBP (low back pain) 09/27/2014  . Avitaminosis D 09/27/2014  . Decreased leukocytes 09/27/2014  . Abnormal blood sugar 05/03/2009  . Insomnia 09/29/2007  . BP (high blood pressure) 03/09/2007  . Adaptation reaction 01/29/2007  . Acid reflux 10/07/2006  . Menopausal and postmenopausal disorder 10/05/2006  . Headache, migraine 10/05/2006  . Arthritis, degenerative 03/23/1994  . Adult hypothyroidism 04/15/1993  . Hypercholesteremia 03/25/1993    Past Medical History:  Diagnosis Date  . Arthritis   . Asthma   . Cancer ([place])   . Complication of anesthesia   . Depression   . Family history of adverse reaction to anesthesia    most of family - PONV  . Fibromyalgia   . Glaucoma   . Headache    migraines - 1-2x/mo  . Hypertension   . Motion sickness    all moving vehicles  . PONV (postoperative nausea and vomiting)   . Thyroid disease     Social History   Socioeconomic History  . Marital status: Married    Spouse name: [name]  .  Number of children: 3  . Years of education: Not on file  . Highest education level: Not on file  Social Needs  . Financial resource strain: Not on file  . Food insecurity - worry: Not on file  . Food insecurity - inability: Not on file  . Transportation needs - medical: Not on file  . Transportation needs - non-medical: Not on file  Occupational History    Employer: [employer]  Tobacco Use  . Smoking status: Former Smoker    Last attempt to quit: 05/26/1975    Years since quitting: 41.9  . Smokeless tobacco: Never Used  . Tobacco comment: quit 45  years ago   Substance and Sexual Activity  . Alcohol use: No  . Drug use: No  .  Sexual activity: No  Other Topics Concern  . Not on file  Social History Narrative  . Not on file    Outpatient Encounter Medications as of 04/27/2017  Medication Sig  . acetaminophen (TYLENOL) 325 MG tablet Take 2 tablets (650 mg total) by mouth every 6 (six) hours as needed for mild pain.  Marland Kitchen amLODipine (NORVASC) 5 MG tablet Take 1 tablet (5 mg total) daily by mouth.  Marland Kitchen apixaban (ELIQUIS) 5 MG TABS tablet Take 1 tablet (5 mg total) by mouth 2 (two) times daily. (Patient taking differently: Take 2.5 mg by mouth 2 (two) times daily. )  . aspirin 81 MG tablet ASPIRIN, 81MG  (Oral Tablet Delayed Release)  1 Every Day for 0 days  Quantity: 0.00;  Refills: 0   Ordered :17-November-2010  [name] ;  Started 05-Oct-2006 Active Comments: DX: 401.9  . buPROPion (WELLBUTRIN XL) 300 MG 24 hr tablet Take 1 tablet (300 mg total) by mouth daily.  . Cholecalciferol (VITAMIN D) 2000 units tablet Take 2,000 Units by mouth daily.  . Cyanocobalamin (B-12) 500 MCG TABS Take by mouth.  . EPINEPHrine (EPIPEN 2-PAK) 0.3 mg/0.3 mL IJ SOAJ injection INJECT AS DIRECTED FOR SEVERE ALLERGIC REACTION  . fenofibrate (TRICOR) 145 MG tablet Take 1 tablet by mouth daily.  Marland Kitchen levothyroxine (SYNTHROID, LEVOTHROID) 88 MCG tablet Take 1 tablet (88 mcg total) by mouth daily.  Marland Kitchen lisinopril (PRINIVIL,ZESTRIL) 5 MG tablet Take 2.5 mg by mouth daily. (Patient taking differently: Take 2 tablets in the morning and 1 tablet in the evening)  . metoprolol tartrate (LOPRESSOR) 25 MG tablet Take 1.5 tablets (37.5 mg total) by mouth 2 (two) times daily.  . Multiple Vitamin (MULTI-VITAMINS) TABS Take 1 tablet by mouth daily.  . rosuvastatin (CRESTOR) 10 MG tablet Take 1 tablet (10 mg total) by mouth daily.  . traMADol (ULTRAM) 50 MG tablet Take 1 tablet (50 mg total) by mouth every 6 (six) hours as needed for moderate pain.  Marland Kitchen zolpidem (AMBIEN) 10 MG tablet Take 1 tablet (10 mg total) by mouth at bedtime as needed. for sleep  . albuterol  (VENTOLIN HFA) 108 (90 Base) MCG/ACT inhaler INHALE 2 PUFFS BY MOUTH EVERY 4 TO 6 HOURS AS NEEDED FOR SHORTNESS OF BREATH (Patient not taking: Reported on 03/29/2017)  . diazepam (VALIUM) 10 MG tablet Take 1 tablet (10 mg total) by mouth as needed. (Patient not taking: Reported on 04/27/2017)  . [DISCONTINUED] amiodarone (PACERONE) 200 MG tablet Take 1 tablet (200 mg total) by mouth every 12 (twelve) hours. For one week then take Amiodarone 200 mg by mouth daily thereafter.   No facility-administered encounter medications on file as of 04/27/2017.     Allergies  Allergen Reactions  .  Cephalexin Anaphylaxis  . Atorvastatin   . Morphine And Related Hives  . Novocain [Procaine]     Chest pain  . Shellfish Allergy Swelling    angioedema  . Statins Other (See Comments)    Muscle pain  . Tomato     (by testing) - also beans, wheat, peas  . Penicillins Hives and Rash    Review of Systems  Constitutional: Positive for malaise/fatigue.  Eyes: Negative.   Respiratory: Negative.   Cardiovascular: Positive for chest pain and leg swelling. Negative for palpitations.  Gastrointestinal: Negative.   Musculoskeletal: Positive for back pain, joint pain and myalgias.  Skin: Negative.   Neurological: Negative.        Numbness in right foot  Endo/Heme/Allergies: Negative.   Psychiatric/Behavioral: Positive for depression. The patient is nervous/anxious and has insomnia.     Objective:  BP (!) 148/68   Pulse 72   Temp 98.1 F (36.7 C)   Resp 14   Wt 168 lb 12.8 oz (76.6 kg)   BMI 30.87 kg/m   Physical Exam  Constitutional: She is oriented to person, place, and time and well-developed, well-nourished, and in no distress.  HENT:  Head: Normocephalic and atraumatic.  Right Ear: External ear normal.  Left Ear: External ear normal.  Nose: Nose normal.  Eyes: Conjunctivae are normal. Pupils are equal, round, and reactive to light.  Neck: No thyromegaly present.  Cardiovascular: Normal rate,  regular rhythm, normal heart sounds and intact distal pulses. Exam reveals no gallop.  No murmur heard. Pulmonary/Chest: Effort normal and breath sounds normal. No respiratory distress. She has no wheezes.  Abdominal: Soft.  Musculoskeletal: She exhibits edema (1+).  Neurological: She is alert and oriented to person, place, and time. Gait normal. GCS score is 15.  Skin: Skin is warm and dry.  Psychiatric: Mood, memory, affect and judgment normal.    Assessment and Plan :  1. Essential hypertension Elevated still today but better. Will increase Lisinopril to 40 mg daily. BP stabilizing. - lisinopril (PRINIVIL,ZESTRIL) 40 MG tablet; Take 1 tablet (40 mg total) by mouth daily.  Dispense: 90 tablet; Refill: 3  2. Recurrent major depressive disorder, in full remission ([place]) Stable. Better. No medication changes to be made today.  3. Primary insomnia 4. Hypercholesteremia  5. Hypothyroidism, unspecified type 6.CAD/CABG Patient is following up with Dr [name]. Advised patient to ask Dr [name] about lifting post surgery. Advised patient to try OTC Zostrix cream to apply to scar issue that is irritated.  HPI, Exam and A&P transcribed by Tiffany Kocher, RMA under direction and in the presence of Miguel Aschoff, MD. I have done the exam and reviewed the chart and it is accurate to the best of my knowledge. Development worker, community has been used and  any errors in dictation or transcription are unintentional. Miguel Aschoff M.D. [facility]

## 2017-04-27 NOTE — Patient Instructions (Addendum)
Try OTC Zostrix for the skin discomfort to apply daily. Increase Lisinopril to 40 mg 1 tablet daily.

## 2017-04-29 DIAGNOSIS — E039 Hypothyroidism, unspecified: Secondary | ICD-10-CM | POA: Diagnosis not present

## 2017-04-29 DIAGNOSIS — F5101 Primary insomnia: Secondary | ICD-10-CM | POA: Diagnosis not present

## 2017-04-29 DIAGNOSIS — F3342 Major depressive disorder, recurrent, in full remission: Secondary | ICD-10-CM | POA: Diagnosis not present

## 2017-04-29 DIAGNOSIS — E782 Mixed hyperlipidemia: Secondary | ICD-10-CM | POA: Diagnosis not present

## 2017-04-29 DIAGNOSIS — E7800 Pure hypercholesterolemia, unspecified: Secondary | ICD-10-CM | POA: Diagnosis not present

## 2017-04-29 DIAGNOSIS — I340 Nonrheumatic mitral (valve) insufficiency: Secondary | ICD-10-CM | POA: Diagnosis not present

## 2017-04-29 DIAGNOSIS — I25810 Atherosclerosis of coronary artery bypass graft(s) without angina pectoris: Secondary | ICD-10-CM | POA: Diagnosis not present

## 2017-04-29 DIAGNOSIS — I10 Essential (primary) hypertension: Secondary | ICD-10-CM | POA: Diagnosis not present

## 2017-04-29 LAB — CBC WITH DIFFERENTIAL/PLATELET
BASOS ABS: 88 {cells}/uL (ref 0–200)
BASOS PCT: 1.8 %
EOS ABS: 191 {cells}/uL (ref 15–500)
EOS PCT: 3.9 %
HEMATOCRIT: 43 % (ref 35.0–45.0)
HEMOGLOBIN: 14 g/dL (ref 11.7–15.5)
LYMPHS ABS: 1088 {cells}/uL (ref 850–3900)
MCH: 28.2 pg (ref 27.0–33.0)
MCHC: 32.6 g/dL (ref 32.0–36.0)
MCV: 86.5 fL (ref 80.0–100.0)
MONOS PCT: 6.3 %
MPV: 10.3 fL (ref 7.5–12.5)
NEUTROS ABS: 3224 {cells}/uL (ref 1500–7800)
Neutrophils Relative %: 65.8 %
PLATELETS: 195 10*3/uL (ref 140–400)
RBC: 4.97 10*6/uL (ref 3.80–5.10)
RDW: 13.1 % (ref 11.0–15.0)
Total Lymphocyte: 22.2 %
WBC mixed population: 309 cells/uL (ref 200–950)
WBC: 4.9 10*3/uL (ref 3.8–10.8)

## 2017-04-29 LAB — COMPLETE METABOLIC PANEL WITH GFR
AG RATIO: 1.6 (calc) (ref 1.0–2.5)
ALBUMIN MSPROF: 4.2 g/dL (ref 3.6–5.1)
ALT: 11 U/L (ref 6–29)
AST: 16 U/L (ref 10–35)
Alkaline phosphatase (APISO): 70 U/L (ref 33–130)
BUN: 15 mg/dL (ref 7–25)
CALCIUM: 9.6 mg/dL (ref 8.6–10.4)
CO2: 32 mmol/L (ref 20–32)
CREATININE: 0.93 mg/dL (ref 0.60–0.93)
Chloride: 102 mmol/L (ref 98–110)
GFR, EST AFRICAN AMERICAN: 70 mL/min/{1.73_m2} (ref >=60)
GFR, Est Non African American: 61 mL/min/{1.73_m2} (ref >=60)
GLOBULIN: 2.6 g/dL (ref 1.9–3.7)
Glucose, Bld: 105 mg/dL — ABNORMAL HIGH (ref 65–99)
Potassium: 4.3 mmol/L (ref 3.5–5.3)
SODIUM: 141 mmol/L (ref 135–146)
TOTAL PROTEIN: 6.8 g/dL (ref 6.1–8.1)
Total Bilirubin: 0.4 mg/dL (ref 0.2–1.2)

## 2017-04-29 LAB — LIPID PANEL
CHOL/HDL RATIO: 2.9 (calc) (ref <5.0)
CHOLESTEROL: 165 mg/dL (ref <200)
HDL: 56 mg/dL (ref >50)
LDL Cholesterol (Calc): 86 mg/dL (calc)
Non-HDL Cholesterol (Calc): 109 mg/dL (calc) (ref <130)
Triglycerides: 134 mg/dL (ref <150)

## 2017-04-29 LAB — TSH: TSH: 4.7 m[IU]/L — AB (ref 0.40–4.50)

## 2017-04-30 ENCOUNTER — Telehealth: Payer: Self-pay

## 2017-04-30 MED ORDER — LEVOTHYROXINE SODIUM 100 MCG PO TABS: 100.0000 ug | ORAL_TABLET | Freq: Every day | ORAL | 3 refills | Status: DC

## 2017-04-30 NOTE — Telephone Encounter (Signed)
-----   Message from [name], MD sent at 04/30/2017  9:42 AM EST ----- Good--increase Synthroid from 88 to 134mcg daily.

## 2017-04-30 NOTE — Telephone Encounter (Signed)
Patient advised. RX sent to [facility] for 100 mcg

## 2017-05-05 ENCOUNTER — Telehealth: Payer: Self-pay | Admitting: Family Medicine

## 2017-05-05 NOTE — Telephone Encounter (Signed)
ok 

## 2017-05-05 NOTE — Telephone Encounter (Signed)
Pt's husband Albertina Parr stated [facility] [address] them that levothyroxine (SYNTHROID, LEVOTHROID) 100 MCG tablet has been on back order and they need a new Rx to get another manufacture. Please advise. Thanks TNP

## 2017-05-05 NOTE — Telephone Encounter (Signed)
Spoke with pharmacist and approved the change to a different manufacturer [employer], RMA

## 2017-05-22 ENCOUNTER — Encounter (HOSPITAL_COMMUNITY): Payer: Self-pay | Admitting: *Deleted

## 2017-05-22 ENCOUNTER — Emergency Department (HOSPITAL_COMMUNITY): Payer: Medicare Other

## 2017-05-22 ENCOUNTER — Inpatient Hospital Stay (HOSPITAL_COMMUNITY)
Admission: EM | Admit: 2017-05-22 | Discharge: 2017-05-24 | DRG: 287 | Disposition: A | Discharge status: Home / Self Care | Payer: Medicare Other | Attending: Internal Medicine | Admitting: Internal Medicine

## 2017-05-22 ENCOUNTER — Other Ambulatory Visit: Payer: Self-pay

## 2017-05-22 DIAGNOSIS — Z885 Allergy status to narcotic agent status: Secondary | ICD-10-CM

## 2017-05-22 DIAGNOSIS — I2510 Atherosclerotic heart disease of native coronary artery without angina pectoris: Secondary | ICD-10-CM | POA: Diagnosis not present

## 2017-05-22 DIAGNOSIS — Z91013 Allergy to seafood: Secondary | ICD-10-CM

## 2017-05-22 DIAGNOSIS — I4891 Unspecified atrial fibrillation: Secondary | ICD-10-CM | POA: Diagnosis present

## 2017-05-22 DIAGNOSIS — Z79899 Other long term (current) drug therapy: Secondary | ICD-10-CM | POA: Diagnosis not present

## 2017-05-22 DIAGNOSIS — M797 Fibromyalgia: Secondary | ICD-10-CM | POA: Diagnosis present

## 2017-05-22 DIAGNOSIS — H409 Unspecified glaucoma: Secondary | ICD-10-CM | POA: Diagnosis present

## 2017-05-22 DIAGNOSIS — I200 Unstable angina: Secondary | ICD-10-CM | POA: Diagnosis not present

## 2017-05-22 DIAGNOSIS — Z87891 Personal history of nicotine dependence: Secondary | ICD-10-CM

## 2017-05-22 DIAGNOSIS — Z951 Presence of aortocoronary bypass graft: Secondary | ICD-10-CM

## 2017-05-22 DIAGNOSIS — Z7982 Long term (current) use of aspirin: Secondary | ICD-10-CM | POA: Diagnosis not present

## 2017-05-22 DIAGNOSIS — R0789 Other chest pain: Secondary | ICD-10-CM | POA: Diagnosis not present

## 2017-05-22 DIAGNOSIS — Z91018 Allergy to other foods: Secondary | ICD-10-CM

## 2017-05-22 DIAGNOSIS — Z880 Allergy status to penicillin: Secondary | ICD-10-CM | POA: Diagnosis not present

## 2017-05-22 DIAGNOSIS — E785 Hyperlipidemia, unspecified: Secondary | ICD-10-CM | POA: Diagnosis present

## 2017-05-22 DIAGNOSIS — I2582 Chronic total occlusion of coronary artery: Secondary | ICD-10-CM | POA: Diagnosis present

## 2017-05-22 DIAGNOSIS — I25700 Atherosclerosis of coronary artery bypass graft(s), unspecified, with unstable angina pectoris: Secondary | ICD-10-CM | POA: Diagnosis not present

## 2017-05-22 DIAGNOSIS — R072 Precordial pain: Secondary | ICD-10-CM | POA: Diagnosis not present

## 2017-05-22 DIAGNOSIS — Z881 Allergy status to other antibiotic agents status: Secondary | ICD-10-CM | POA: Diagnosis not present

## 2017-05-22 DIAGNOSIS — I252 Old myocardial infarction: Secondary | ICD-10-CM | POA: Diagnosis not present

## 2017-05-22 DIAGNOSIS — R079 Chest pain, unspecified: Secondary | ICD-10-CM

## 2017-05-22 DIAGNOSIS — J45909 Unspecified asthma, uncomplicated: Secondary | ICD-10-CM | POA: Diagnosis present

## 2017-05-22 DIAGNOSIS — G8929 Other chronic pain: Secondary | ICD-10-CM | POA: Diagnosis not present

## 2017-05-22 DIAGNOSIS — I10 Essential (primary) hypertension: Secondary | ICD-10-CM | POA: Diagnosis present

## 2017-05-22 DIAGNOSIS — R9439 Abnormal result of other cardiovascular function study: Secondary | ICD-10-CM | POA: Diagnosis present

## 2017-05-22 DIAGNOSIS — E782 Mixed hyperlipidemia: Secondary | ICD-10-CM | POA: Diagnosis not present

## 2017-05-22 DIAGNOSIS — R748 Abnormal levels of other serum enzymes: Secondary | ICD-10-CM | POA: Diagnosis not present

## 2017-05-22 DIAGNOSIS — I214 Non-ST elevation (NSTEMI) myocardial infarction: Secondary | ICD-10-CM | POA: Diagnosis not present

## 2017-05-22 DIAGNOSIS — Z8249 Family history of ischemic heart disease and other diseases of the circulatory system: Secondary | ICD-10-CM

## 2017-05-22 HISTORY — DX: Hyperlipidemia, unspecified: E78.5

## 2017-05-22 HISTORY — DX: Essential (primary) hypertension: I10

## 2017-05-22 HISTORY — DX: Abnormal result of other cardiovascular function study: R94.39

## 2017-05-22 HISTORY — DX: Atherosclerotic heart disease of native coronary artery without angina pectoris: I25.10

## 2017-05-22 LAB — CBC
HEMATOCRIT: 42 % (ref 36.0–46.0)
HEMOGLOBIN: 13.3 g/dL (ref 12.0–15.0)
MCH: 27.5 pg (ref 26.0–34.0)
MCHC: 31.7 g/dL (ref 30.0–36.0)
MCV: 86.8 fL (ref 78.0–100.0)
Platelets: 161 10*3/uL (ref 150–400)
RBC: 4.84 MIL/uL (ref 3.87–5.11)
RDW: 13.6 % (ref 11.5–15.5)
WBC: 6.2 10*3/uL (ref 4.0–10.5)

## 2017-05-22 LAB — BASIC METABOLIC PANEL
ANION GAP: 9 (ref 5–15)
BUN: 13 mg/dL (ref 6–20)
CALCIUM: 9.2 mg/dL (ref 8.9–10.3)
CO2: 27 mmol/L (ref 22–32)
Chloride: 100 mmol/L — ABNORMAL LOW (ref 101–111)
Creatinine, Ser: 0.84 mg/dL (ref 0.44–1.00)
GFR calc non Af Amer: >60 mL/min (ref >60)
GLUCOSE: 103 mg/dL — AB (ref 65–99)
POTASSIUM: 3.9 mmol/L (ref 3.5–5.1)
Sodium: 136 mmol/L (ref 135–145)

## 2017-05-22 LAB — I-STAT TROPONIN, ED: TROPONIN I, POC: 0.25 ng/mL — AB (ref 0.00–0.08)

## 2017-05-22 LAB — TSH: TSH: 2.103 u[IU]/mL (ref 0.350–4.500)

## 2017-05-22 LAB — TROPONIN I: Troponin I: 0.18 ng/mL (ref <0.03)

## 2017-05-22 MED ORDER — ADULT MULTIVITAMIN W/MINERALS CH
1.0000 | ORAL_TABLET | Freq: Every day | ORAL | Status: DC
  Administered 2017-05-24: 1 via ORAL
  Filled 2017-05-22 (×2): qty 1

## 2017-05-22 MED ORDER — HEPARIN BOLUS VIA INFUSION
3000.0000 [IU] | Freq: Once | INTRAVENOUS | Status: AC
  Administered 2017-05-22: 3000 [IU] via INTRAVENOUS
  Filled 2017-05-22: qty 3000

## 2017-05-22 MED ORDER — ASPIRIN EC 81 MG PO TBEC
81.0000 mg | DELAYED_RELEASE_TABLET | Freq: Every day | ORAL | Status: DC
  Administered 2017-05-23 – 2017-05-24 (×2): 81 mg via ORAL
  Filled 2017-05-22 (×2): qty 1

## 2017-05-22 MED ORDER — LISINOPRIL 40 MG PO TABS
40.0000 mg | ORAL_TABLET | Freq: Every day | ORAL | Status: DC
  Administered 2017-05-23 – 2017-05-24 (×2): 40 mg via ORAL
  Filled 2017-05-22 (×2): qty 1

## 2017-05-22 MED ORDER — ASPIRIN 81 MG PO TABS: 81.0000 mg | ORAL_TABLET | Freq: Every day | ORAL | Status: DC

## 2017-05-22 MED ORDER — LEVOTHYROXINE SODIUM 100 MCG PO TABS
100.0000 ug | ORAL_TABLET | Freq: Every day | ORAL | Status: DC
  Administered 2017-05-23 – 2017-05-24 (×2): 100 ug via ORAL
  Filled 2017-05-22 (×2): qty 1

## 2017-05-22 MED ORDER — NITROGLYCERIN 0.4 MG SL SUBL: 0.4000 mg | SUBLINGUAL_TABLET | SUBLINGUAL | Status: DC | PRN

## 2017-05-22 MED ORDER — ASPIRIN 300 MG RE SUPP: 300.0000 mg | RECTAL | Status: AC

## 2017-05-22 MED ORDER — ONDANSETRON HCL 4 MG/2ML IJ SOLN: 4.0000 mg | Freq: Four times a day (QID) | INTRAMUSCULAR | Status: DC | PRN

## 2017-05-22 MED ORDER — AMLODIPINE BESYLATE 5 MG PO TABS
5.0000 mg | ORAL_TABLET | Freq: Every day | ORAL | Status: DC
  Administered 2017-05-22: 5 mg via ORAL
  Filled 2017-05-22: qty 1

## 2017-05-22 MED ORDER — BUPROPION HCL ER (XL) 150 MG PO TB24
300.0000 mg | ORAL_TABLET | Freq: Every day | ORAL | Status: DC
  Administered 2017-05-23 – 2017-05-24 (×2): 300 mg via ORAL
  Filled 2017-05-22 (×2): qty 2

## 2017-05-22 MED ORDER — HEPARIN (PORCINE) IN NACL 100-0.45 UNIT/ML-% IJ SOLN
1000.0000 [IU]/h | INTRAMUSCULAR | Status: DC
  Administered 2017-05-22: 750 [IU]/h via INTRAVENOUS
  Filled 2017-05-22: qty 250

## 2017-05-22 MED ORDER — NITROGLYCERIN 2 % TD OINT
1.0000 [in_us] | TOPICAL_OINTMENT | Freq: Three times a day (TID) | TRANSDERMAL | Status: DC
  Administered 2017-05-22 – 2017-05-24 (×5): 1 [in_us] via TOPICAL
  Filled 2017-05-22: qty 30

## 2017-05-22 MED ORDER — NITROGLYCERIN IN D5W 200-5 MCG/ML-% IV SOLN
0.0000 ug/min | Freq: Once | INTRAVENOUS | Status: AC
  Administered 2017-05-22: 5 ug/min via INTRAVENOUS
  Filled 2017-05-22: qty 250

## 2017-05-22 MED ORDER — ACETAMINOPHEN 325 MG PO TABS: 650.0000 mg | ORAL_TABLET | ORAL | Status: DC | PRN

## 2017-05-22 MED ORDER — VITAMIN D 1000 UNITS PO TABS
2000.0000 [IU] | ORAL_TABLET | Freq: Every day | ORAL | Status: DC
  Administered 2017-05-24: 2000 [IU] via ORAL
  Filled 2017-05-22: qty 2

## 2017-05-22 MED ORDER — DIAZEPAM 5 MG PO TABS
10.0000 mg | ORAL_TABLET | ORAL | Status: DC | PRN
Start: 2017-05-22 — End: 2017-05-24
  Administered 2017-05-23: 5 mg via ORAL
  Administered 2017-05-23 – 2017-05-24 (×2): 10 mg via ORAL
  Filled 2017-05-22 (×3): qty 2

## 2017-05-22 MED ORDER — ASPIRIN 81 MG PO CHEW: 324.0000 mg | CHEWABLE_TABLET | ORAL | Status: AC

## 2017-05-22 MED ORDER — ROSUVASTATIN CALCIUM 10 MG PO TABS
10.0000 mg | ORAL_TABLET | Freq: Every day | ORAL | Status: DC
  Administered 2017-05-23 – 2017-05-24 (×2): 10 mg via ORAL
  Filled 2017-05-22 (×2): qty 1

## 2017-05-22 MED ORDER — ACETAMINOPHEN 325 MG PO TABS: 650.0000 mg | ORAL_TABLET | Freq: Four times a day (QID) | ORAL | Status: DC | PRN

## 2017-05-22 MED ORDER — ZOLPIDEM TARTRATE 5 MG PO TABS
5.0000 mg | ORAL_TABLET | Freq: Every evening | ORAL | Status: DC | PRN
  Administered 2017-05-23: 5 mg via ORAL
  Filled 2017-05-22: qty 1

## 2017-05-22 NOTE — ED Provider Notes (Signed)
[facility] EMERGENCY DEPARTMENT Provider Note   CSN: [id number] Arrival date & time: 05/22/17  1415     History   Chief Complaint Chief Complaint  Patient presents with  . Chest Pain  . Dizziness  . Shortness of Breath    HPI Stacey [name] is a 76 y.o. female.  HPI   76 year old female who had an STEMI in October 29 went to cath due to continued chest pain, found to have diffuse disease and then moved to [facility] for quadruple bypass.  Pt discharged 2 months ago.  Patient stopped of course, gated by A. fib.  On Eliquis.  She is here today with substernal fullness radiating to her back into her jaw starting 2 hours prior to arrival.  Patient not had any chest pain since discharge.  Patient did not take anything prior to arrival.    Past Medical History:  Diagnosis Date  . Arthritis   . Asthma   . Cancer ([place])   . Complication of anesthesia   . Depression   . Family history of adverse reaction to anesthesia    most of family - PONV  . Fibromyalgia   . Glaucoma   . Headache    migraines - 1-2x/mo  . Hypertension   . Motion sickness    all moving vehicles  . PONV (postoperative nausea and vomiting)   . Thyroid disease     Patient Active Problem List   Diagnosis Date Noted  . 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 PAF (paroxysmal atrial fibrillation) ([place])   . Non-STEMI (non-ST elevated myocardial infarction) ([place]) 02/20/2017  . 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 Moderate mitral insufficiency 01/11/2017  . Personal history of colonic polyps   . Benign neoplasm of ascending colon   . Benign neoplasm of descending colon   . Chest pain 09/16/2015  . Disequilibrium 09/16/2015  . Panic attacks 09/11/2015  . Ganglion cyst 08/16/2015  . Hypernatremia 07/04/2015  . Upper back pain 07/04/2015  . Other osteoarthritis involving multiple joints 02/06/2015  . Clinical depression 09/27/2014  . Dry  mouth 09/27/2014  . Fibrositis 09/27/2014  . LBP (low back pain) 09/27/2014  . Avitaminosis D 09/27/2014  . Decreased leukocytes 09/27/2014  . Abnormal blood sugar 05/03/2009  . Insomnia 09/29/2007  . BP (high blood pressure) 03/09/2007  . Adaptation reaction 01/29/2007  . Acid reflux 10/07/2006  . Menopausal and postmenopausal disorder 10/05/2006  . Headache, migraine 10/05/2006  . Arthritis, degenerative 03/23/1994  . Adult hypothyroidism 04/15/1993  . Hypercholesteremia 03/25/1993    Past Surgical History:  Procedure Laterality Date  . ABDOMINAL HYSTERECTOMY    . APPENDECTOMY    . BLADDER SURGERY    . CATARACT EXTRACTION W/ INTRAOCULAR LENS  IMPLANT, BILATERAL    . COLONOSCOPY WITH PROPOFOL N/A 01/17/2016   Procedure: COLONOSCOPY WITH PROPOFOL;  Surgeon: [name], MD;  Location: [facility];  Service: Endoscopy;  Laterality: N/A;  . CORONARY ARTERY BYPASS GRAFT N/A 02/20/2017   Procedure: CORONARY ARTERY BYPASS GRAFTING (CABG) x , three using left internal mammary artery to left anterior descending coronary artery and right greater saphenous vein harvested endoscopically to distal right and diagonal coronary arteries.;  Surgeon: Grace Isaac, MD;  Location: [facility];  Service: Open Heart Surgery;  Laterality: N/A;  . LEFT HEART CATH AND CORONARY ANGIOGRAPHY N/A 02/20/2017   Procedure: LEFT HEART CATH AND CORONARY ANGIOGRAPHY;  Surgeon: Isaias Cowman, MD;  Location:  [facility] CV LAB;  Service: Cardiovascular;  Laterality: N/A;  . NASAL SINUS SURGERY    . POLYPECTOMY  01/17/2016   Procedure: POLYPECTOMY;  Surgeon: [name], MD;  Location: [facility];  Service: Endoscopy;;  . RIGHT OOPHORECTOMY    . TEE WITHOUT CARDIOVERSION N/A 02/20/2017   Procedure: TRANSESOPHAGEAL ECHOCARDIOGRAM (TEE);  Surgeon: Grace Isaac, MD;  Location: [facility];  Service: Open Heart Surgery;  Laterality: N/A;    OB History    Gravida Para Term Preterm AB Living   1 1            SAB TAB Ectopic Multiple Live Births                   Home Medications    Prior to Admission medications   Medication Sig Start Date End Date Taking? Authorizing Provider  acetaminophen (TYLENOL) 325 MG tablet Take 2 tablets (650 mg total) by mouth every 6 (six) hours as needed for mild pain. 02/28/17   Nani Skillern, PA-C  albuterol (VENTOLIN HFA) 108 (90 Base) MCG/ACT inhaler INHALE 2 PUFFS BY MOUTH EVERY 4 TO 6 HOURS AS NEEDED FOR SHORTNESS OF BREATH Patient not taking: Reported on 03/29/2017 12/17/16   Mar Daring, PA-C  amLODipine (NORVASC) 5 MG tablet Take 1 tablet (5 mg total) daily by mouth. 04/04/17   [name], MD  apixaban (ELIQUIS) 5 MG TABS tablet Take 1 tablet (5 mg total) by mouth 2 (two) times daily. Patient taking differently: Take 2.5 mg by mouth 2 (two) times daily.  02/28/17   Nani Skillern, PA-C  aspirin 81 MG tablet ASPIRIN, 81MG  (Oral Tablet Delayed Release)  1 Every Day for 0 days  Quantity: 0.00;  Refills: 0   Ordered :17-November-2010  [name] ;  Started 05-Oct-2006 Active Comments: DX: 401.9 10/05/06   [provider]  buPROPion (WELLBUTRIN XL) 300 MG 24 hr tablet Take 1 tablet (300 mg total) by mouth daily. 03/22/17   [name], MD  Cholecalciferol (VITAMIN D) 2000 units tablet Take 2,000 Units by mouth daily.    [provider]  Cyanocobalamin (B-12) 500 MCG TABS Take by mouth.    [provider]  diazepam (VALIUM) 10 MG tablet Take 1 tablet (10 mg total) by mouth as needed. Patient not taking: Reported on 04/27/2017 12/17/16   Mar Daring, PA-C  EPINEPHrine (EPIPEN 2-PAK) 0.3 mg/0.3 mL IJ SOAJ injection INJECT AS DIRECTED FOR SEVERE ALLERGIC REACTION 12/17/16   Mar Daring, PA-C  fenofibrate (TRICOR) 145 MG tablet Take 1 tablet by mouth daily. 01/26/17   [provider]  levothyroxine (SYNTHROID, LEVOTHROID) 100 MCG tablet Take 1 tablet (100 mcg total) by  mouth daily. 04/30/17   [name], MD  lisinopril (PRINIVIL,ZESTRIL) 40 MG tablet Take 1 tablet (40 mg total) by mouth daily. 04/27/17   [name], MD  metoprolol tartrate (LOPRESSOR) 25 MG tablet Take 1.5 tablets (37.5 mg total) by mouth 2 (two) times daily. 02/28/17   Nani Skillern, PA-C  Multiple Vitamin (MULTI-VITAMINS) TABS Take 1 tablet by mouth daily.    [provider]  rosuvastatin (CRESTOR) 10 MG tablet Take 1 tablet (10 mg total) by mouth daily. 03/22/17   [name], MD  traMADol (ULTRAM) 50 MG tablet Take 1 tablet (50 mg total) by mouth every 6 (six) hours as needed for moderate pain. 02/28/17   Nani Skillern, PA-C  zolpidem (AMBIEN) 10 MG tablet Take 1 tablet (10 mg total) by mouth at bedtime as needed. for sleep 03/22/17   [name], MD    Family History Family History  Problem Relation Age of Onset  . Alzheimer's disease Mother   . Kidney cancer Mother   . Heart attack Father   . Pancreatic cancer Sister   . Heart attack Brother 47  . Ovarian cancer Sister   . Kidney disease Sister        dialysis  . Cancer Sister        uterin  . Diabetes Sister   . Heart attack Sister   . Heart attack Brother 8    Social History Social History   Tobacco Use  . Smoking status: Former Smoker    Last attempt to quit: 05/26/1975    Years since quitting: 42.0  . Smokeless tobacco: Never Used  . Tobacco comment: quit 45  years ago   Substance Use Topics  . Alcohol use: No  . Drug use: No     Allergies   Cephalexin; Atorvastatin; Morphine and related; Novocain [procaine]; Shellfish allergy; Statins; Tomato; and Penicillins   Review of Systems Review of Systems  Constitutional: Negative for activity change, fatigue and fever.  Respiratory: Positive for chest tightness. Negative for shortness of breath.   Cardiovascular: Positive for chest pain.  Gastrointestinal: Negative for abdominal pain.    Musculoskeletal: Positive for back pain.  All other systems reviewed and are negative.    Physical Exam Updated Vital Signs BP (!) 168/82   Pulse 65   Temp 97.8 F (36.6 C) (Oral)   Resp 15   SpO2 98%   Physical Exam  Constitutional: She is oriented to person, place, and time. She appears well-developed and well-nourished.  HENT:  Head: Normocephalic and atraumatic.  Eyes: Right eye exhibits no discharge.  Cardiovascular: Normal rate, regular rhythm, normal heart sounds and normal pulses.  No murmur heard. Pulmonary/Chest: Effort normal and breath sounds normal. She has no wheezes. She has no rales.  Abdominal: Soft. She exhibits no distension. There is no tenderness.  Neurological: She is oriented to person, place, and time.  Skin: Skin is warm and dry. She is not diaphoretic.  Psychiatric: She has a normal mood and affect.  Nursing note and vitals reviewed.    ED Treatments / Results  Labs (all labs ordered are listed, but only abnormal results are displayed) Labs Reviewed  I-STAT TROPONIN, ED - Abnormal; Notable for the following components:      Result Value   Troponin i, poc 0.25 (*)    All other components within normal limits  CBC  BASIC METABOLIC PANEL    EKG  EKG Interpretation  Date/Time:  Saturday May 22 2017 14:22:08 EST Ventricular Rate:  70 PR Interval:  168 QRS Duration: 66 QT Interval:  394 QTC Calculation: 425 R Axis:   53 Text Interpretation:  Normal sinus rhythm Low voltage QRS Septal infarct , age undetermined Abnormal ECG Normal sinus rhythm Confirmed by Thomasene Lot, [place] [phone]) on 05/22/2017 3:01:28 PM       Radiology No results found.  Procedures Procedures (including critical care time)  CRITICAL CARE Performed by: Gardiner Sleeper Total critical care time: 60 minutes Critical care time was exclusive of separately billable procedures and treating other patients. Critical care was necessary to treat or prevent  imminent or life-threatening deterioration. Critical care was time spent personally by me on the following activities: development of treatment  plan with patient and/or surrogate as well as nursing, discussions with consultants, evaluation of patient's response to treatment, examination of patient, obtaining history from patient or surrogate, ordering and performing treatments and interventions, ordering and review of laboratory studies, ordering and review of radiographic studies, pulse oximetry and re-evaluation of patient's condition.   Medications Ordered in ED Medications  nitroGLYCERIN 50 mg in dextrose 5 % 250 mL (0.2 mg/mL) infusion (not administered)     Initial Impression / Assessment and Plan / ED Course  I have reviewed the triage vital signs and the nursing notes.  Pertinent labs & imaging results that were available during my care of the patient were reviewed by me and considered in my medical decision making (see chart for details).     76 year old female who had an STEMI in October 29 went to cath due to continued chest pain, found to have diffuse disease and then moved to [facility] for quadruple bypass.  Pt discharged 2 months ago.  Patient stopped of course, gated by A. fib.  On Eliquis.  She is here today with substernal fullness radiating to her back into her jaw starting 2 hours prior to arrival.  Patient not had any chest pain since discharge.  Patient did not take anything prior to arrival.  3:17 PM Patient story very concerning for ischemia.  Patient has elevated troponin 0.25.  EKG shows no evidence of STEMI.  Will touch base with cardiology.  Heparin and nitro drip started.  Final Clinical Impressions(s) / ED Diagnoses   Final diagnoses:  None    ED Discharge Orders    None       Macarthur Critchley, MD 05/23/17 2059

## 2017-05-22 NOTE — Progress Notes (Signed)
ANTICOAGULATION CONSULT NOTE - Initial Consult  Pharmacy Consult for heparin  Indication: chest pain/ACS  Allergies  Allergen Reactions  . Cephalexin Anaphylaxis  . Atorvastatin   . Morphine And Related Hives  . Novocain [Procaine]     Chest pain  . Shellfish Allergy Swelling    angioedema  . Statins Other (See Comments)    Muscle pain  . Tomato     (by testing) - also beans, wheat, peas  . Penicillins Hives and Rash    Patient Measurements:   Heparin Dosing Weight: 65 kg   Vital Signs: Temp: 97.8 F (36.6 C) (12/29 1427) Temp Source: Oral (12/29 1427) BP: 135/84 (12/29 1530) Pulse Rate: 69 (12/29 1530)  Labs: Recent Labs    05/22/17 1425  HGB 13.3  HCT 42.0  PLT 161  CREATININE 0.84    CrCl cannot be calculated (Unknown ideal weight.).    Assessment: Stacey [name] is a 76 yo female admitted with ACS/STEMI. Pharmacy has been consulted for heparin dosing. Of note, patient is 3 months out from a 3 vessel CABG. CBC is within normal limits. Patient had previously been prescribed Eliquis but this was stopped on 04/29/17. No signs of bleeding noted in the chart.   Goal of Therapy:  Heparin level 0.3-0.7 units/ml Monitor platelets by anticoagulation protocol: Yes   Plan:  Heparin 3000 units X 1  Then 750 units/hr  8 hour heparin level  Daily heparin level/CBC Monitor for s/sx of bleeding   Susa Raring, PharmD, BCPS PGY2 Infectious Diseases Pharmacy Resident Phone: Rhunette Croft [phone] 05/22/2017,3:54 PM

## 2017-05-22 NOTE — ED Notes (Signed)
Lab results reported to Nurse Benjamine Mola.

## 2017-05-22 NOTE — ED Notes (Signed)
Heparin Verified with [facility] ED RN

## 2017-05-22 NOTE — Consult Note (Signed)
CARDIOLOGY CONSULT NOTE     Primary Care Physician: [name], MD Referring Physician:  ED  Admit Date: 05/22/2017  Reason for consultation:  Chest discomfort  Stacey [name] is a 76 y.o. female with a h/o CAD s/p CABG 3 months ago who now presents with chest "heaviness". She reports doing well s/p recent surgery.  She is making good recovery.  Today while at a holiday gathering, she noticed mild chest heaviness.  She presented to [facility] ED and was given IV nitro with improvement in her pain.  She is currently pain free and without complaint.  Today, she denies symptoms of palpitations, shortness of breath, orthopnea, PND, lower extremity edema, dizziness, presyncope, syncope, or neurologic sequela. The patient is tolerating medications without difficulties and is otherwise without complaint today.   Past Medical History:  Diagnosis Date  . Arthritis   . Asthma   . Cancer ([place])   . Complication of anesthesia   . Depression   . Family history of adverse reaction to anesthesia    most of family - PONV  . Fibromyalgia   . Glaucoma   . Headache    migraines - 1-2x/mo  . Hypertension   . Motion sickness    all moving vehicles  . PONV (postoperative nausea and vomiting)   . Thyroid disease    Past Surgical History:  Procedure Laterality Date  . ABDOMINAL HYSTERECTOMY    . APPENDECTOMY    . BLADDER SURGERY    . CATARACT EXTRACTION W/ INTRAOCULAR LENS  IMPLANT, BILATERAL    . COLONOSCOPY WITH PROPOFOL N/A 01/17/2016   Procedure: COLONOSCOPY WITH PROPOFOL;  Surgeon: [name], MD;  Location: [facility];  Service: Endoscopy;  Laterality: N/A;  . CORONARY ARTERY BYPASS GRAFT N/A 02/20/2017   Procedure: CORONARY ARTERY BYPASS GRAFTING (CABG) x , three using left internal mammary artery to left anterior descending coronary artery and right greater saphenous vein harvested endoscopically to distal right and diagonal coronary arteries.;  Surgeon: Grace Isaac, MD;  Location: [facility];  Service: Open Heart Surgery;  Laterality: N/A;  . LEFT HEART CATH AND CORONARY ANGIOGRAPHY N/A 02/20/2017   Procedure: LEFT HEART CATH AND CORONARY ANGIOGRAPHY;  Surgeon: Isaias Cowman, MD;  Location: [facility];  Service: Cardiovascular;  Laterality: N/A;  . NASAL SINUS SURGERY    . POLYPECTOMY  01/17/2016   Procedure: POLYPECTOMY;  Surgeon: [name], MD;  Location: [facility];  Service: Endoscopy;;  . RIGHT OOPHORECTOMY    . TEE WITHOUT CARDIOVERSION N/A 02/20/2017   Procedure: TRANSESOPHAGEAL ECHOCARDIOGRAM (TEE);  Surgeon: Grace Isaac, MD;  Location: [facility];  Service: Open Heart Surgery;  Laterality: N/A;     . heparin 750 Units/hr (05/22/17 1652)    Allergies  Allergen Reactions  . Cephalexin Anaphylaxis  . Atorvastatin   . Morphine And Related Hives  . Novocain [Procaine]     Chest pain  . Shellfish Allergy Swelling    angioedema  . Statins Other (See Comments)    Muscle pain  . Tomato     (by testing) - also beans, wheat, peas  . Penicillins Hives and Rash    Social History   Socioeconomic History  . Marital status: Married    Spouse name: [name]  . Number of children: 3  . Years of education: Not on file  . Highest education level: Not on file  Social Needs  . Financial resource strain: Not on file  . Food  insecurity - worry: Not on file  . Food insecurity - inability: Not on file  . Transportation needs - medical: Not on file  . Transportation needs - non-medical: Not on file  Occupational History    Employer: [employer]  Tobacco Use  . Smoking status: Former Smoker    Last attempt to quit: 05/26/1975    Years since quitting: 42.0  . Smokeless tobacco: Never Used  . Tobacco comment: quit 45  years ago   Substance and Sexual Activity  . Alcohol use: No  . Drug use: No  . Sexual activity: No  Other Topics Concern  . Not on file  Social History Narrative  . Not on file    Family  History  Problem Relation Age of Onset  . Alzheimer's disease Mother   . Kidney cancer Mother   . Heart attack Father   . Pancreatic cancer Sister   . Heart attack Brother 86  . Ovarian cancer Sister   . Kidney disease Sister        dialysis  . Cancer Sister        uterin  . Diabetes Sister   . Heart attack Sister   . Heart attack Brother 56    ROS- All systems are reviewed and negative except as per the HPI above  Physical Exam: Telemetry: Vitals:   05/22/17 1530 05/22/17 1545 05/22/17 1600 05/22/17 1630  BP: 135/84 (!) 155/90 (!) 141/82 (!) 150/76  Pulse: 69 66 65 69  Resp: 18 11 15 16   Temp:      TempSrc:      SpO2: 96% 96% 93% 96%  Weight:   165 lb (74.8 kg)     GEN- The patient is well appearing, alert and oriented x 3 today.   Head- normocephalic, atraumatic Eyes-  Sclera clear, conjunctiva pink Ears- hearing intact Oropharynx- clear Neck- supple, no JVP Lymph- no cervical lymphadenopathy Lungs- Clear to ausculation bilaterally, normal work of breathing Heart- Regular rate and rhythm, no murmurs, rubs or gallops, PMI not laterally displaced GI- soft, NT, ND, + BS Extremities- no clubbing, cyanosis, or edema MS- no significant deformity or atrophy Skin- no rash or lesion Psych- euthymic mood, full affect Neuro- strength and sensation are intact  EKG:  Sinus rhythm, no ischemic changes  Labs:   Lab Results  Component Value Date   WBC 6.2 05/22/2017   HGB 13.3 05/22/2017   HCT 42.0 05/22/2017   MCV 86.8 05/22/2017   PLT 161 05/22/2017    Recent Labs  Lab 05/22/17 1425  NA 136  K 3.9  CL 100*  CO2 27  BUN 13  CREATININE 0.84  CALCIUM 9.2  GLUCOSE 103*   Lab Results  Component Value Date   TROPONINI 6.38 (HH) 02/20/2017    Lab Results  Component Value Date   CHOL 165 04/29/2017   CHOL 183 02/20/2017   CHOL 174 12/18/2016   Lab Results  Component Value Date   HDL 56 04/29/2017   HDL 58 02/20/2017   HDL 54 12/18/2016   Lab Results   Component Value Date   LDLCALC 106 (H) 02/20/2017   LDLCALC 104 (H) 12/18/2016   LDLCALC 169 (H) 01/07/2016   Lab Results  Component Value Date   TRIG 134 04/29/2017   TRIG 96 02/20/2017   TRIG 79 12/18/2016   Lab Results  Component Value Date   CHOLHDL 2.9 04/29/2017   CHOLHDL 3.2 02/20/2017   CHOLHDL 3.2 12/18/2016   No results found  for: LDLDIRECT     ASSESSMENT AND PLAN:   1. Chest pain/CAD Given recent CABG, will admit for further evaluation.  Continue on IV heparin Convert IV nitro to nitropaste.  Will optimize medical regimen while here. Will cycle Cms on telemetry overnight.  If rules out for MI, would plan myoview tomorrow.  If significant elevation in CMs or refractory chest pain, then would consider cath  2. HTN Stable No change required today  3. Mixed hyperlipidemia Stable No change required today    Thompson Grayer, MD 05/22/2017  5:05 PM

## 2017-05-22 NOTE — ED Triage Notes (Signed)
To ED for eval of chest pressure, back pain, and dizziness that started this am. Pt is 3 months out of 3 vessel CABG.

## 2017-05-23 ENCOUNTER — Inpatient Hospital Stay (HOSPITAL_COMMUNITY): Payer: Medicare Other

## 2017-05-23 DIAGNOSIS — R748 Abnormal levels of other serum enzymes: Secondary | ICD-10-CM

## 2017-05-23 LAB — NM MYOCAR MULTI W/SPECT W/WALL MOTION / EF
CSEPED: 0 min
CSEPEDS: 0 s
CSEPHR: 69 %
CSEPPHR: 101 {beats}/min
Estimated workload: 1 METS
MPHR: 146 {beats}/min
RPE: 0
Rest HR: 71 {beats}/min

## 2017-05-23 LAB — CBC
HCT: 39.4 % (ref 36.0–46.0)
HEMOGLOBIN: 12.6 g/dL (ref 12.0–15.0)
MCH: 28 pg (ref 26.0–34.0)
MCHC: 32 g/dL (ref 30.0–36.0)
MCV: 87.6 fL (ref 78.0–100.0)
Platelets: 127 10*3/uL — ABNORMAL LOW (ref 150–400)
RBC: 4.5 MIL/uL (ref 3.87–5.11)
RDW: 14.1 % (ref 11.5–15.5)
WBC: 4.3 10*3/uL (ref 4.0–10.5)

## 2017-05-23 LAB — MAGNESIUM: MAGNESIUM: 1.9 mg/dL (ref 1.7–2.4)

## 2017-05-23 LAB — HEPARIN LEVEL (UNFRACTIONATED)
HEPARIN UNFRACTIONATED: 0.21 [IU]/mL — AB (ref 0.30–0.70)
Heparin Unfractionated: 0.14 IU/mL — ABNORMAL LOW (ref 0.30–0.70)

## 2017-05-23 LAB — T4, FREE: Free T4: 1.37 ng/dL — ABNORMAL HIGH (ref 0.61–1.12)

## 2017-05-23 LAB — TROPONIN I
TROPONIN I: 0.19 ng/mL — AB (ref <0.03)
Troponin I: 0.21 ng/mL (ref <0.03)

## 2017-05-23 MED ORDER — TECHNETIUM TC 99M TETROFOSMIN IV KIT
10.0000 | PACK | Freq: Once | INTRAVENOUS | Status: AC | PRN
  Administered 2017-05-23: 10 via INTRAVENOUS

## 2017-05-23 MED ORDER — HEPARIN (PORCINE) IN NACL 100-0.45 UNIT/ML-% IJ SOLN
1000.0000 [IU]/h | INTRAMUSCULAR | Status: DC
  Administered 2017-05-23: 1000 [IU]/h via INTRAVENOUS
  Filled 2017-05-23: qty 250

## 2017-05-23 MED ORDER — REGADENOSON 0.4 MG/5ML IV SOLN
0.4000 mg | Freq: Once | INTRAVENOUS | Status: AC
  Administered 2017-05-23: 0.4 mg via INTRAVENOUS
  Filled 2017-05-23: qty 5

## 2017-05-23 MED ORDER — AMINOPHYLLINE 25 MG/ML IV SOLN
INTRAVENOUS | Status: AC
  Administered 2017-05-23: 75 mg via INTRAVENOUS
  Filled 2017-05-23: qty 10

## 2017-05-23 MED ORDER — AMLODIPINE BESYLATE 5 MG PO TABS: 5.0000 mg | ORAL_TABLET | Freq: Once | ORAL | Status: DC

## 2017-05-23 MED ORDER — TECHNETIUM TC 99M TETROFOSMIN IV KIT
30.0000 | PACK | Freq: Once | INTRAVENOUS | Status: AC | PRN
  Administered 2017-05-23: 30 via INTRAVENOUS

## 2017-05-23 MED ORDER — HEPARIN BOLUS VIA INFUSION
4000.0000 [IU] | Freq: Once | INTRAVENOUS | Status: AC
  Administered 2017-05-23: 4000 [IU] via INTRAVENOUS
  Filled 2017-05-23: qty 4000

## 2017-05-23 MED ORDER — REGADENOSON 0.4 MG/5ML IV SOLN
INTRAVENOUS | Status: AC
  Administered 2017-05-23: 0.4 mg via INTRAVENOUS
  Filled 2017-05-23: qty 5

## 2017-05-23 MED ORDER — AMLODIPINE BESYLATE 10 MG PO TABS
10.0000 mg | ORAL_TABLET | Freq: Every day | ORAL | Status: DC
  Administered 2017-05-23 – 2017-05-24 (×2): 10 mg via ORAL
  Filled 2017-05-23 (×2): qty 1

## 2017-05-23 MED ORDER — AMINOPHYLLINE 25 MG/ML IV (NUC MED)
75.0000 mg | Freq: Once | INTRAVENOUS | Status: AC
  Administered 2017-05-23: 75 mg via INTRAVENOUS

## 2017-05-23 MED ORDER — HEPARIN BOLUS VIA INFUSION
2000.0000 [IU] | Freq: Once | INTRAVENOUS | Status: AC
  Administered 2017-05-23: 2000 [IU] via INTRAVENOUS
  Filled 2017-05-23: qty 2000

## 2017-05-23 NOTE — Progress Notes (Signed)
Progress Note  Patient Name: Stacey [name] Date of Encounter: 05/23/2017  Primary Cardiologist:  [facility] Dr Saralyn Pilar  Subjective   Still with her chronic chest pain she has had since surgery --none of pain she arrived with.  Inpatient Medications    Scheduled Meds: . amLODipine  5 mg Oral Daily  . aspirin  324 mg Oral NOW   Or  . aspirin  300 mg Rectal NOW  . aspirin EC  81 mg Oral Daily  . buPROPion  300 mg Oral Daily  . cholecalciferol  2,000 Units Oral Daily  . levothyroxine  100 mcg Oral QAC breakfast  . lisinopril  40 mg Oral Daily  . multivitamin with minerals  1 tablet Oral Daily  . nitroGLYCERIN  1 inch Topical Q8H  . rosuvastatin  10 mg Oral Daily   Continuous Infusions: . heparin 1,000 Units/hr (05/23/17 0215)   PRN Meds: acetaminophen, diazepam, nitroGLYCERIN, ondansetron (ZOFRAN) IV, zolpidem   Vital Signs    Vitals:   05/22/17 2033 05/23/17 0608 05/23/17 0911 05/23/17 0943  BP: (!) 155/89 131/69 (!) 162/89 138/71  Pulse: 71 63 70 94  Resp: 18 18    Temp: 98.3 F (36.8 C) 98.1 F (36.7 C)    TempSrc: Oral Oral    SpO2: 97% 95%    Weight:  163 lb 4.8 oz (74.1 kg)    Height:        Intake/Output Summary (Last 24 hours) at 05/23/2017 0949 Last data filed at 05/23/2017 0756 Gross per 24 hour  Intake 350.76 ml  Output 2100 ml  Net -1749.24 ml   Filed Weights   05/22/17 1600 05/22/17 2026 05/23/17 5462  Weight: 165 lb (74.8 kg) 166 lb 3.2 oz (75.4 kg) 163 lb 4.8 oz (74.1 kg)    Telemetry    SR with PACs - Personally Reviewed  ECG    SR with PACs and possible septal MI though looking back similar to EKGs in Oct. - Personally Reviewed  Physical Exam   GEN: No acute distress though anxious   Neck: No JVD Cardiac: RRR, no murmurs, rubs, or gallops.  Respiratory: Clear to auscultation bilaterally. GI: Soft, nontender, non-distended  MS: No edema; No deformity. Neuro:  Nonfocal  Psych: Normal affect   Labs      Chemistry Recent Labs  Lab 05/22/17 1425  NA 136  K 3.9  CL 100*  CO2 27  GLUCOSE 103*  BUN 13  CREATININE 0.84  CALCIUM 9.2  GFRNONAA >60  GFRAA >60  ANIONGAP 9     Hematology Recent Labs  Lab 05/22/17 1425 05/23/17 0629  WBC 6.2 4.3  RBC 4.84 4.50  HGB 13.3 12.6  HCT 42.0 39.4  MCV 86.8 87.6  MCH 27.5 28.0  MCHC 31.7 32.0  RDW 13.6 14.1  PLT 161 127*    Cardiac Enzymes Recent Labs  Lab 05/22/17 1751 05/23/17 0036 05/23/17 0629  TROPONINI 0.18* 0.21* 0.19*    Recent Labs  Lab 05/22/17 1445  TROPIPOC 0.25*     BNPNo results for input(s): BNP, PROBNP in the last 168 hours.   DDimer No results for input(s): DDIMER in the last 168 hours.   Radiology    Dg Chest Portable 1 View  Result Date: 05/22/2017 CLINICAL DATA:  Chest pain EXAM: PORTABLE CHEST 1 VIEW COMPARISON:  03/29/2017 FINDINGS: Stable cardiomegaly is noted. Postsurgical changes are again seen. Aortic calcifications are noted. The lungs are well aerated bilaterally without focal infiltrate or sizable effusion.  No acute bony abnormality is noted. IMPRESSION: No acute abnormality noted. Electronically Signed   By: Inez Catalina M.D.   On: 05/22/2017 15:27    Cardiac Studies   lexiscan myoview pending  Patient Profile     76 y.o. female a h/o CAD s/p CABG 3 months ago who presents 05/22/17 with chest "heaviness".  Troponin mildly elevated.     Assessment & Plan    Chest pain with elevated troponin, post CABG, nuc study today if + will need cath.  HTN elevated  HLD mixed continue statin   For questions or updates, please contact [place] Please consult [email or web address] for contact info under Cardiology/STEMI.      Signed, Cecilie Kicks, NP  05/23/2017, 9:49 AM     I have seen, examined the patient, and reviewed the above assessment and plan.  Changes to above are made where necessary.  On exam, RRR.  No complaints at this time.  Troponin mildly elevated but not consistent  with acute thrombotic MI or abrupt graft failure.  Awaiting results of myoview.   If low risk, medical management is advised.  Will stop heparin.  Increase norvasc to 10mg  daily for antianginal effect and BP management.  Consider adding beta blocker if heart rates allow. Will discharge if low risk myoview to follow-up with primary cardiologist.  If high risk myoview, would pursue cath.  Co Sign: Thompson Grayer, MD 05/23/2017 11:42 AM

## 2017-05-23 NOTE — Progress Notes (Signed)
nuc stress test is high risk with  Large size, severe intensity reversible anterior, anteroseptal and apical perfusion defect suggestive of LAD territory ischemia. LVEF 63% with normal wall motion. This is a high risk study.  Discussed with pt, husband and daughter.  Discussed cardiac cath.   The patient understands that risks included but are not limited to stroke (1 in 1000), death (1 in 49), kidney failure [usually temporary] (1 in 500), bleeding (1 in 200), allergic reaction [possibly serious] (1 in 200).   Plan for cardiac cath in AM

## 2017-05-23 NOTE — Progress Notes (Signed)
ANTICOAGULATION CONSULT NOTE - Follow Up Consult  Pharmacy Consult for heparin Indication: chest pain/ACS  Labs: Recent Labs    05/22/17 1425 05/22/17 1751 05/23/17 0036  HGB 13.3  --   --   HCT 42.0  --   --   PLT 161  --   --   HEPARINUNFRC  --   --  0.14*  CREATININE 0.84  --   --   TROPONINI  --  0.18*  --     Assessment: 76yo female subtherapeutic on heparin with initial dosing for CP.   Goal of Therapy:  Heparin level 0.3-0.7 units/ml   Plan:  Will rebolus with heparin 2000 units and increase heparin gtt by 3-4 units/kg/hr to 1000 units/hr (previously therapeutic at this rate) and check level in 8 hours.   Wynona Neat, PharmD, BCPS  05/23/2017,1:35 AM

## 2017-05-23 NOTE — Progress Notes (Signed)
ANTICOAGULATION CONSULT NOTE - Follow Up Consult  Pharmacy Consult for Heparin Indication: chest pain/ACS  Allergies  Allergen Reactions  . Cephalexin Anaphylaxis  . Atorvastatin   . Morphine And Related Hives  . Novocain [Procaine]     Chest pain  . Shellfish Allergy Swelling    angioedema  . Statins Other (See Comments)    Muscle pain  . Tomato     (by testing) - also beans, wheat, peas  . Penicillins Hives and Rash    Patient Measurements: Height: 5' 1.5" (156.2 cm) Weight: 163 lb 4.8 oz (74.1 kg) IBW/kg (Calculated) : 48.95 Heparin Dosing Weight: 65kg  Vital Signs: Temp: 98.4 F (36.9 C) (12/30 1932) Temp Source: Oral (12/30 1932) BP: 135/75 (12/30 1932) Pulse Rate: 81 (12/30 1932)  Labs: Recent Labs    05/22/17 1425 05/22/17 1751 05/23/17 0036 05/23/17 0629 05/23/17 1103  HGB 13.3  --   --  12.6  --   HCT 42.0  --   --  39.4  --   PLT 161  --   --  127*  --   HEPARINUNFRC  --   --  0.14*  --  0.21*  CREATININE 0.84  --   --   --   --   TROPONINI  --  0.18* 0.21* 0.19*  --     Estimated Creatinine Clearance: 54.7 mL/min (by C-G formula based on SCr of 0.84 mg/dL).  Assessment: 76yof to be restarted on heparin given high risk stress test results. Plan for cath tomorrow.  Goal of Therapy:  Heparin level 0.3-0.7 units/ml Monitor platelets by anticoagulation protocol: Yes   Plan:  1) Heparin bolus 4000 units x 1 2) Heparin drip at 1000 units/hr 3) Check 8 hour heparin level  Deboraha Sprang 05/23/2017,10:30 PM

## 2017-05-23 NOTE — Progress Notes (Signed)
lexiscan portion competed.  + SOB and gave aminophylline

## 2017-05-23 NOTE — Progress Notes (Signed)
Noted pt to have high risk stress test.  Iv heparin was discontinued per orders prior to.  Paged MD to inquire heparin being restarted prior to cath.  Orders received for IV heparin per pharmacy.  Will continue to monitor pt closely.

## 2017-05-24 ENCOUNTER — Encounter (HOSPITAL_COMMUNITY)
Admission: EM | Disposition: A | Discharge status: Home / Self Care | Payer: Self-pay | Source: Home / Self Care | Attending: Internal Medicine

## 2017-05-24 ENCOUNTER — Encounter (HOSPITAL_COMMUNITY): Payer: Self-pay | Admitting: Cardiovascular Disease

## 2017-05-24 DIAGNOSIS — I2510 Atherosclerotic heart disease of native coronary artery without angina pectoris: Principal | ICD-10-CM

## 2017-05-24 DIAGNOSIS — R9439 Abnormal result of other cardiovascular function study: Secondary | ICD-10-CM

## 2017-05-24 DIAGNOSIS — E782 Mixed hyperlipidemia: Secondary | ICD-10-CM

## 2017-05-24 DIAGNOSIS — I214 Non-ST elevation (NSTEMI) myocardial infarction: Secondary | ICD-10-CM

## 2017-05-24 HISTORY — DX: Abnormal result of other cardiovascular function study: R94.39

## 2017-05-24 HISTORY — PX: LEFT HEART CATH AND CORS/GRAFTS ANGIOGRAPHY: CATH118250

## 2017-05-24 LAB — HEPARIN LEVEL (UNFRACTIONATED): HEPARIN UNFRACTIONATED: 0.52 [IU]/mL (ref 0.30–0.70)

## 2017-05-24 LAB — PROTIME-INR
INR: 1.04
PROTHROMBIN TIME: 13.5 s (ref 11.4–15.2)

## 2017-05-24 SURGERY — LEFT HEART CATH AND CORS/GRAFTS ANGIOGRAPHY
Anesthesia: LOCAL

## 2017-05-24 MED ORDER — SODIUM CHLORIDE 0.9 % WEIGHT BASED INFUSION
3.0000 mL/kg/h | INTRAVENOUS | Status: AC
  Administered 2017-05-24: 3 mL/kg/h via INTRAVENOUS

## 2017-05-24 MED ORDER — SODIUM CHLORIDE 0.9 % WEIGHT BASED INFUSION
1.0000 mL/kg/h | INTRAVENOUS | Status: DC
Start: 2017-05-24 — End: 2017-05-24

## 2017-05-24 MED ORDER — MIDAZOLAM HCL 2 MG/2ML IJ SOLN
INTRAMUSCULAR | Status: DC | PRN
  Administered 2017-05-24: 1 mg via INTRAVENOUS

## 2017-05-24 MED ORDER — SODIUM CHLORIDE 0.9% FLUSH: 3.0000 mL | INTRAVENOUS | Status: DC | PRN

## 2017-05-24 MED ORDER — HEPARIN SODIUM (PORCINE) 1000 UNIT/ML IJ SOLN
INTRAMUSCULAR | Status: DC | PRN
  Administered 2017-05-24: 4000 [IU] via INTRAVENOUS

## 2017-05-24 MED ORDER — SODIUM CHLORIDE 0.9% FLUSH
3.0000 mL | Freq: Two times a day (BID) | INTRAVENOUS | Status: DC
Start: 2017-05-24 — End: 2017-05-24

## 2017-05-24 MED ORDER — HEPARIN (PORCINE) IN NACL 2-0.9 UNIT/ML-% IJ SOLN
INTRAMUSCULAR | Status: AC
  Filled 2017-05-24: qty 1000

## 2017-05-24 MED ORDER — SODIUM CHLORIDE 0.9 % IV SOLN: 250.0000 mL | INTRAVENOUS | Status: DC | PRN

## 2017-05-24 MED ORDER — IOPAMIDOL (ISOVUE-370) INJECTION 76%
INTRAVENOUS | Status: DC | PRN
  Administered 2017-05-24: 85 mL via INTRA_ARTERIAL

## 2017-05-24 MED ORDER — SODIUM CHLORIDE 0.9% FLUSH: 3.0000 mL | Freq: Two times a day (BID) | INTRAVENOUS | Status: DC

## 2017-05-24 MED ORDER — IOPAMIDOL (ISOVUE-370) INJECTION 76%
INTRAVENOUS | Status: AC
  Filled 2017-05-24: qty 100

## 2017-05-24 MED ORDER — VERAPAMIL HCL 2.5 MG/ML IV SOLN
INTRAVENOUS | Status: AC
  Filled 2017-05-24: qty 2

## 2017-05-24 MED ORDER — METOPROLOL TARTRATE 25 MG PO TABS: 25.0000 mg | ORAL_TABLET | Freq: Two times a day (BID) | ORAL | 11 refills | Status: DC

## 2017-05-24 MED ORDER — MIDAZOLAM HCL 2 MG/2ML IJ SOLN
INTRAMUSCULAR | Status: AC
  Filled 2017-05-24: qty 2

## 2017-05-24 MED ORDER — FENTANYL CITRATE (PF) 100 MCG/2ML IJ SOLN
INTRAMUSCULAR | Status: AC
  Filled 2017-05-24: qty 2

## 2017-05-24 MED ORDER — HEPARIN SODIUM (PORCINE) 1000 UNIT/ML IJ SOLN
INTRAMUSCULAR | Status: AC
  Filled 2017-05-24: qty 1

## 2017-05-24 MED ORDER — FENTANYL CITRATE (PF) 100 MCG/2ML IJ SOLN
INTRAMUSCULAR | Status: DC | PRN
  Administered 2017-05-24: 25 ug via INTRAVENOUS

## 2017-05-24 MED ORDER — ASPIRIN 81 MG PO CHEW
81.0000 mg | CHEWABLE_TABLET | ORAL | Status: AC
  Administered 2017-05-24: 81 mg via ORAL
  Filled 2017-05-24: qty 1

## 2017-05-24 MED ORDER — LIDOCAINE HCL (PF) 1 % IJ SOLN
INTRAMUSCULAR | Status: DC | PRN
  Administered 2017-05-24: 2 mL via INTRADERMAL

## 2017-05-24 MED ORDER — HEPARIN (PORCINE) IN NACL 2-0.9 UNIT/ML-% IJ SOLN
INTRAMUSCULAR | Status: AC | PRN
  Administered 2017-05-24: 1000 mL via INTRA_ARTERIAL

## 2017-05-24 MED ORDER — METOPROLOL TARTRATE 25 MG PO TABS: 25.0000 mg | ORAL_TABLET | Freq: Two times a day (BID) | ORAL | Status: DC

## 2017-05-24 MED ORDER — VERAPAMIL HCL 2.5 MG/ML IV SOLN
INTRAVENOUS | Status: DC | PRN
  Administered 2017-05-24: 10 mL via INTRA_ARTERIAL

## 2017-05-24 MED ORDER — LIDOCAINE HCL (PF) 1 % IJ SOLN
INTRAMUSCULAR | Status: AC
  Filled 2017-05-24: qty 30

## 2017-05-24 MED ORDER — SODIUM CHLORIDE 0.9 % WEIGHT BASED INFUSION
1.0000 mL/kg/h | INTRAVENOUS | Status: DC
  Administered 2017-05-24: 1 mL/kg/h via INTRAVENOUS

## 2017-05-24 SURGICAL SUPPLY — 10 items
CATH INFINITI 5 FR MPA2 (CATHETERS) ×1 IMPLANT
CATH INFINITI 5FR MULTPACK ANG (CATHETERS) ×1 IMPLANT
DEVICE RAD COMP TR BAND LRG (VASCULAR PRODUCTS) ×1 IMPLANT
GLIDESHEATH SLEND SS 6F .021 (SHEATH) ×1 IMPLANT
GUIDEWIRE INQWIRE 1.5J.035X260 (WIRE) IMPLANT
INQWIRE 1.5J .035X260CM (WIRE) ×2
KIT HEART LEFT (KITS) ×2 IMPLANT
PACK CARDIAC CATHETERIZATION (CUSTOM PROCEDURE TRAY) ×2 IMPLANT
TRANSDUCER W/STOPCOCK (MISCELLANEOUS) ×2 IMPLANT
TUBING CIL FLEX 10 FLL-RA (TUBING) ×2 IMPLANT

## 2017-05-24 NOTE — Progress Notes (Signed)
Progress Note  Patient Name: Stacey [name] Date of Encounter: 05/24/2017  Primary Cardiologist:  [facility] Dr Saralyn Pilar  Subjective   Has not felt her pre-admit pain. Says she knew that pain was different and something was wrong. Last cath was through her R groin  Inpatient Medications    Scheduled Meds: . amLODipine  10 mg Oral Daily  . amLODipine  5 mg Oral Once  . aspirin EC  81 mg Oral Daily  . buPROPion  300 mg Oral Daily  . cholecalciferol  2,000 Units Oral Daily  . levothyroxine  100 mcg Oral QAC breakfast  . lisinopril  40 mg Oral Daily  . multivitamin with minerals  1 tablet Oral Daily  . nitroGLYCERIN  1 inch Topical Q8H  . rosuvastatin  10 mg Oral Daily  . sodium chloride flush  3 mL Intravenous Q12H   Continuous Infusions: . sodium chloride    . sodium chloride 1 mL/kg/hr (05/24/17 0736)  . heparin 1,000 Units/hr (05/23/17 2344)   PRN Meds: sodium chloride, acetaminophen, diazepam, nitroGLYCERIN, ondansetron (ZOFRAN) IV, sodium chloride flush, zolpidem   Vital Signs    Vitals:   05/23/17 0943 05/23/17 1257 05/23/17 1932 05/24/17 0523  BP: 138/71 132/79 135/75 120/66  Pulse: 94 86 81 76  Resp:      Temp:  98.4 F (36.9 C) 98.4 F (36.9 C) 97.8 F (36.6 C)  TempSrc:  Oral Oral Oral  SpO2:  99% 98% 94%  Weight:    75 kg (165 lb 5.5 oz)  Height:        Intake/Output Summary (Last 24 hours) at 05/24/2017 0757 Last data filed at 05/24/2017 0300 Gross per 24 hour  Intake 515.17 ml  Output -  Net 515.17 ml   Filed Weights   05/22/17 2026 05/23/17 0608 05/24/17 0523  Weight: 75.4 kg (166 lb 3.2 oz) 74.1 kg (163 lb 4.8 oz) 75 kg (165 lb 5.5 oz)    Telemetry    SR, No sig ectopy- Personally Reviewed  ECG    12/30 SR with PACs and possible septal MI though looking back similar to EKGs in Oct. - Personally Reviewed  Physical Exam   GEN: WD, WN elderly female, NAD   Neck: JVD not elevated Cardiac: reg R&R, no M/R/G Respiratory:  CTA bilaterally GI: +BS, soft, NT, ND MS: No edema, distal pulses 2+ Neuro:  no deficits seen Psych: nl affect  Labs    Chemistry Recent Labs  Lab 05/22/17 1425  NA 136  K 3.9  CL 100*  CO2 27  GLUCOSE 103*  BUN 13  CREATININE 0.84  CALCIUM 9.2  GFRNONAA >60  GFRAA >60  ANIONGAP 9     Hematology Recent Labs  Lab 05/22/17 1425 05/23/17 0629  WBC 6.2 4.3  RBC 4.84 4.50  HGB 13.3 12.6  HCT 42.0 39.4  MCV 86.8 87.6  MCH 27.5 28.0  MCHC 31.7 32.0  RDW 13.6 14.1  PLT 161 127*    Cardiac Enzymes Recent Labs  Lab 05/22/17 1751 05/23/17 0036 05/23/17 0629  TROPONINI 0.18* 0.21* 0.19*    Recent Labs  Lab 05/22/17 1445  TROPIPOC 0.25*     Radiology    Nm Myocar Multi W/spect W/wall Motion / Ef  Result Date: 05/23/2017  Downsloping ST segment depression ST segment depression was noted during stress in the II, III and aVF leads.  No T wave inversion was noted during stress.  Defect 1: There is a large defect of severe  severity.  Findings consistent with ischemia.  Nuclear stress EF: 63%.  Large size, severe intensity reversible anterior, anteroseptal and apical perfusion defect suggestive of LAD territory ischemia. LVEF 63% with normal wall motion. This is a high risk study.   Dg Chest Portable 1 View  Result Date: 05/22/2017 CLINICAL DATA:  Chest pain EXAM: PORTABLE CHEST 1 VIEW COMPARISON:  03/29/2017 FINDINGS: Stable cardiomegaly is noted. Postsurgical changes are again seen. Aortic calcifications are noted. The lungs are well aerated bilaterally without focal infiltrate or sizable effusion. No acute bony abnormality is noted. IMPRESSION: No acute abnormality noted. Electronically Signed   By: Inez Catalina M.D.   On: 05/22/2017 15:27    Cardiac Studies   lexiscan myoview pending  Patient Profile     76 y.o. female a h/o CAD s/p CABG 3 months ago who presents 05/22/17 with chest "heaviness".  Troponin mildly elevated. MV abnl>>cath  12/31  Assessment & Plan    Chest pain  - hx CABG 01/2017 - MV was high-risk, results above>>cath today - pain-free on ASA, statin, ACE - Home dose Lopressor 25 mg bid held on admit>>restart  HTN  - BP improving, was elevated on admit  HLD  - problems tolerating statins, continue low-dose Crestor  For questions or updates, please contact [facility] Please consult [email or web address] for contact info under Cardiology/STEMI.      Signed, Rosaria Ferries, PA-C  05/24/2017, 7:57 AM     Attending Note:   The patient was seen and examined.  Agree with assessment and plan as noted above.  Changes made to the above note as needed.  Patient seen and independently examined with  Rosaria Ferries, PA .   We discussed all aspects of the encounter. I agree with the assessment and plan as stated above.  1. CAD :  S/p CABG 3 months ago. Now presents with recurrent CP . myoview showed a large reversible ant. Apical defect She is scheduled for cath.  2.  HTN: Blood pressure is well controlled.  3.  Hyperlipidemia: Continue Crestor   I have spent a total of 40 minutes with patient reviewing hospital  notes , telemetry, EKGs, labs and examining patient as well as establishing an assessment and plan that was discussed with the patient. > 50% of time was spent in direct patient care.    Thayer Headings, Brooke Bonito., MD, [facility] 05/24/2017, 8:53 AM [address],  [place] Pager [phone]

## 2017-05-24 NOTE — H&P (View-Only) (Signed)
Progress Note  Patient Name: Stacey [name] Date of Encounter: 05/24/2017  Primary Cardiologist:  [facility] Dr Saralyn Pilar  Subjective   Has not felt her pre-admit pain. Says she knew that pain was different and something was wrong. Last cath was through her R groin  Inpatient Medications    Scheduled Meds: . amLODipine  10 mg Oral Daily  . amLODipine  5 mg Oral Once  . aspirin EC  81 mg Oral Daily  . buPROPion  300 mg Oral Daily  . cholecalciferol  2,000 Units Oral Daily  . levothyroxine  100 mcg Oral QAC breakfast  . lisinopril  40 mg Oral Daily  . multivitamin with minerals  1 tablet Oral Daily  . nitroGLYCERIN  1 inch Topical Q8H  . rosuvastatin  10 mg Oral Daily  . sodium chloride flush  3 mL Intravenous Q12H   Continuous Infusions: . sodium chloride    . sodium chloride 1 mL/kg/hr (05/24/17 0736)  . heparin 1,000 Units/hr (05/23/17 2344)   PRN Meds: sodium chloride, acetaminophen, diazepam, nitroGLYCERIN, ondansetron (ZOFRAN) IV, sodium chloride flush, zolpidem   Vital Signs    Vitals:   05/23/17 0943 05/23/17 1257 05/23/17 1932 05/24/17 0523  BP: 138/71 132/79 135/75 120/66  Pulse: 94 86 81 76  Resp:      Temp:  98.4 F (36.9 C) 98.4 F (36.9 C) 97.8 F (36.6 C)  TempSrc:  Oral Oral Oral  SpO2:  99% 98% 94%  Weight:    75 kg (165 lb 5.5 oz)  Height:        Intake/Output Summary (Last 24 hours) at 05/24/2017 0757 Last data filed at 05/24/2017 0300 Gross per 24 hour  Intake 515.17 ml  Output -  Net 515.17 ml   Filed Weights   05/22/17 2026 05/23/17 0608 05/24/17 0523  Weight: 75.4 kg (166 lb 3.2 oz) 74.1 kg (163 lb 4.8 oz) 75 kg (165 lb 5.5 oz)    Telemetry    SR, No sig ectopy- Personally Reviewed  ECG    12/30 SR with PACs and possible septal MI though looking back similar to EKGs in Oct. - Personally Reviewed  Physical Exam   GEN: WD, WN elderly female, NAD   Neck: JVD not elevated Cardiac: reg R&R, no M/R/G Respiratory:  CTA bilaterally GI: +BS, soft, NT, ND MS: No edema, distal pulses 2+ Neuro:  no deficits seen Psych: nl affect  Labs    Chemistry Recent Labs  Lab 05/22/17 1425  NA 136  K 3.9  CL 100*  CO2 27  GLUCOSE 103*  BUN 13  CREATININE 0.84  CALCIUM 9.2  GFRNONAA >60  GFRAA >60  ANIONGAP 9     Hematology Recent Labs  Lab 05/22/17 1425 05/23/17 0629  WBC 6.2 4.3  RBC 4.84 4.50  HGB 13.3 12.6  HCT 42.0 39.4  MCV 86.8 87.6  MCH 27.5 28.0  MCHC 31.7 32.0  RDW 13.6 14.1  PLT 161 127*    Cardiac Enzymes Recent Labs  Lab 05/22/17 1751 05/23/17 0036 05/23/17 0629  TROPONINI 0.18* 0.21* 0.19*    Recent Labs  Lab 05/22/17 1445  TROPIPOC 0.25*     Radiology    Nm Myocar Multi W/spect W/wall Motion / Ef  Result Date: 05/23/2017  Downsloping ST segment depression ST segment depression was noted during stress in the II, III and aVF leads.  No T wave inversion was noted during stress.  Defect 1: There is a large defect of severe  severity.  Findings consistent with ischemia.  Nuclear stress EF: 63%.  Large size, severe intensity reversible anterior, anteroseptal and apical perfusion defect suggestive of LAD territory ischemia. LVEF 63% with normal wall motion. This is a high risk study.   Dg Chest Portable 1 View  Result Date: 05/22/2017 CLINICAL DATA:  Chest pain EXAM: PORTABLE CHEST 1 VIEW COMPARISON:  03/29/2017 FINDINGS: Stable cardiomegaly is noted. Postsurgical changes are again seen. Aortic calcifications are noted. The lungs are well aerated bilaterally without focal infiltrate or sizable effusion. No acute bony abnormality is noted. IMPRESSION: No acute abnormality noted. Electronically Signed   By: Inez Catalina M.D.   On: 05/22/2017 15:27    Cardiac Studies   lexiscan myoview pending  Patient Profile     76 y.o. female a h/o CAD s/p CABG 3 months ago who presents 05/22/17 with chest "heaviness".  Troponin mildly elevated. MV abnl>>cath  12/31  Assessment & Plan    Chest pain  - hx CABG 01/2017 - MV was high-risk, results above>>cath today - pain-free on ASA, statin, ACE - Home dose Lopressor 25 mg bid held on admit>>restart  HTN  - BP improving, was elevated on admit  HLD  - problems tolerating statins, continue low-dose Crestor  For questions or updates, please contact [facility] Please consult [email or web address] for contact info under Cardiology/STEMI.      Signed, Rosaria Ferries, PA-C  05/24/2017, 7:57 AM     Attending Note:   The patient was seen and examined.  Agree with assessment and plan as noted above.  Changes made to the above note as needed.  Patient seen and independently examined with  Rosaria Ferries, PA .   We discussed all aspects of the encounter. I agree with the assessment and plan as stated above.  1. CAD :  S/p CABG 3 months ago. Now presents with recurrent CP . myoview showed a large reversible ant. Apical defect She is scheduled for cath.  2.  HTN: Blood pressure is well controlled.  3.  Hyperlipidemia: Continue Crestor   I have spent a total of 40 minutes with patient reviewing hospital  notes , telemetry, EKGs, labs and examining patient as well as establishing an assessment and plan that was discussed with the patient. > 50% of time was spent in direct patient care.    Thayer Headings, Brooke Bonito., MD, [facility] 05/24/2017, 8:53 AM [address],  [place] Pager [phone]

## 2017-05-24 NOTE — Discharge Summary (Signed)
Discharge Summary    Patient ID: Stacey [name],  MRN: [id number], DOB/AGE: 1940/11/28 76 y.o.  Admit date: 05/22/2017 Discharge date: 05/24/2017  Primary Care Provider: [name] Primary Cardiologist:  [facility] - B. [name], MD  Discharge Diagnoses    Principal Problem:   Chest pain, moderate coronary artery risk Active Problems:   HTN (hypertension)   Hyperlipidemia LDL goal <70   Abnormal nuclear stress test   Allergies Allergies  Allergen Reactions  . Cephalexin Anaphylaxis  . Atorvastatin   . Morphine And Related Hives  . Novocain [Procaine]     Chest pain  . Shellfish Allergy Swelling    angioedema  . Statins Other (See Comments)    Muscle pain  . Tomato     (by testing) - also beans, wheat, peas  . Penicillins Hives and Rash    Diagnostic Studies/Procedures    CARDIAC CATH: 05/24/2017 1. Severe 3 vessel CAD with total occlusion of the RCA and LAD, severe stenosis of a small intermediate branch 2. S/P CABG with wide patency of the LIMA-LAD and SVG-distal RCA, patency of the SVG-diagonal with slow flow, likely secondary to SVG-target vessel size mismatch 3. Moderately elevated LVEDP Recommend: medical management.  Diagnostic Diagram        _____________   History of Present Illness     76 y.o. female a h/o CAD s/p CABG 3 months ago, HTN, HLD, presented 05/22/17 with chest "heaviness".  Troponin mildly elevated.   Hospital Course     Consultants: None   She was pain-free on IV heparin and nitroglycerin paste.  She was continued on aspirin, and statin.  Her blood pressure was elevated after admission and her beta-blocker was restarted.  Her LDL was recently calculated and is above goal at 106.  She is encouraged to stick to a low-cholesterol diet, and follow-up with her regular cardiologist.  She had a mild elevation in her cardiac enzymes but not very high. It was felt that graft closure was unlikely.  Therefore, she had  a Myoview on 05/23/2017.  Myoview results are below.  It was read as high risk.  Therefore, cardiac catheterization was scheduled.  She was taken to the Cath Lab on 05/24/2017.  Cardiac catheterization results are above.  Her bypass grafts were patent.  There was an 80% ramus intermedius, but it is a small vessel and medical therapy is the best option.  Post cath, pt had some ecchymosis at her left radial cath site, but no significant hematoma.  She ambulated with staff without symptoms or limitations and will be discharged home today in good condition.   _____________  Discharge Vitals Blood pressure 134/77, pulse 78, temperature 97.9 F (36.6 C), temperature source Oral, resp. Rate 16, height 5' 1.5" (1.562 m), weight 165 lb 5.5 oz (75 kg), SpO2 97 %.  Filed Weights   05/22/17 2026 05/23/17 0608 05/24/17 0523  Weight: 166 lb 3.2 oz (75.4 kg) 163 lb 4.8 oz (74.1 kg) 165 lb 5.5 oz (75 kg)    Labs & Radiologic Studies    CBC Recent Labs    05/22/17 1425 05/23/17 0629  WBC 6.2 4.3  HGB 13.3 12.6  HCT 42.0 39.4  MCV 86.8 87.6  PLT 161 016*   Basic Metabolic Panel Recent Labs    05/22/17 1425 05/23/17 0036  NA 136  --   K 3.9  --   CL 100*  --   CO2 27  --   GLUCOSE  103*  --   BUN 13  --   CREATININE 0.84  --   CALCIUM 9.2  --   MG  --  1.9   Cardiac Enzymes Recent Labs    05/22/17 1751 05/23/17 0036 05/23/17 0629  TROPONINI 0.18* 0.21* 0.19*   Thyroid Function Tests Recent Labs    05/22/17 1751  TSH 2.103    _____________  Nm Myocar Multi W/spect W/wall Motion / Ef  Result Date: 05/23/2017  Downsloping ST segment depression ST segment depression was noted during stress in the II, III and aVF leads.  No T wave inversion was noted during stress.  Defect 1: There is a large defect of severe severity.  Findings consistent with ischemia.  Nuclear stress EF: 63%.  Large size, severe intensity reversible anterior, anteroseptal and apical perfusion defect  suggestive of LAD territory ischemia. LVEF 63% with normal wall motion. This is a high risk study.   Dg Chest Portable 1 View  Result Date: 05/22/2017 CLINICAL DATA:  Chest pain EXAM: PORTABLE CHEST 1 VIEW COMPARISON:  03/29/2017 FINDINGS: Stable cardiomegaly is noted. Postsurgical changes are again seen. Aortic calcifications are noted. The lungs are well aerated bilaterally without focal infiltrate or sizable effusion. No acute bony abnormality is noted. IMPRESSION: No acute abnormality noted. Electronically Signed   By: Inez Catalina M.D.   On: 05/22/2017 15:27   Disposition   Pt is being discharged home today in good condition.  Follow-up Plans & Appointments    Follow-up Information    Corey Skains, MD Follow up.   Specialty:  Cardiology Contact information: [address] [facility] [place] [address] [phone]          Discharge Instructions    Call MD for:  redness, tenderness, or signs of infection (pain, swelling, redness, odor or green/yellow discharge around incision site)   Complete by:  As directed    Diet - low sodium heart healthy   Complete by:  As directed    Increase activity slowly   Complete by:  As directed       Discharge Medications   Allergies as of 05/24/2017      Reactions   Cephalexin Anaphylaxis   Atorvastatin    Morphine And Related Hives   Novocain [procaine]    Chest pain   Shellfish Allergy Swelling   angioedema   Statins Other (See Comments)   Muscle pain   Tomato    (by testing) - also beans, wheat, peas   Penicillins Hives, Rash      Medication List    STOP taking these medications   apixaban 5 MG Tabs tablet Commonly known as:  ELIQUIS     TAKE these medications   acetaminophen 325 MG tablet Commonly known as:  TYLENOL Take 2 tablets (650 mg total) by mouth every 6 (six) hours as needed for mild pain.   albuterol 108 (90 Base) MCG/ACT inhaler Commonly known as:  VENTOLIN  HFA INHALE 2 PUFFS BY MOUTH EVERY 4 TO 6 HOURS AS NEEDED FOR SHORTNESS OF BREATH   amLODipine 5 MG tablet Commonly known as:  NORVASC Take 1 tablet (5 mg total) daily by mouth.   aspirin 81 MG tablet ASPIRIN, 81MG  (Oral Tablet Delayed Release)  1 Every Day for 0 days  Quantity: 0.00;  Refills: 0   Ordered :17-November-2010  [name] ;  Started 05-Oct-2006 Active Comments: DX: 401.9   B-12 500 MCG Tabs Take by mouth.   buPROPion 300 MG  24 hr tablet Commonly known as:  WELLBUTRIN XL Take 1 tablet (300 mg total) by mouth daily.   capsaicin 0.025 % cream Commonly known as:  ZOSTRIX Apply 1 application topically as needed (nerve pain in chest).   diazepam 10 MG tablet Commonly known as:  VALIUM Take 1 tablet (10 mg total) by mouth as needed.   EPINEPHrine 0.3 mg/0.3 mL Soaj injection Commonly known as:  EPIPEN 2-PAK INJECT AS DIRECTED FOR SEVERE ALLERGIC REACTION   levothyroxine 100 MCG tablet Commonly known as:  SYNTHROID, LEVOTHROID Take 1 tablet (100 mcg total) by mouth daily. Notes to patient:  Please take on an empty stomach, 30 minutes before any additional intake or medications.   lisinopril 40 MG tablet Commonly known as:  PRINIVIL,ZESTRIL Take 1 tablet (40 mg total) by mouth daily.   metoprolol tartrate 25 MG tablet Commonly known as:  LOPRESSOR Take 1 tablet (25 mg total) by mouth 2 (two) times daily.   MULTI-VITAMINS Tabs Take 1 tablet by mouth daily.   oxymetazoline 0.05 % nasal spray Commonly known as:  AFRIN Place 1 spray into both nostrils as needed for congestion.   rosuvastatin 10 MG tablet Commonly known as:  CRESTOR Take 1 tablet (10 mg total) by mouth daily.   traMADol 50 MG tablet Commonly known as:  ULTRAM Take 1 tablet (50 mg total) by mouth every 6 (six) hours as needed for moderate pain.   Vitamin D 2000 units tablet Take 2,000 Units by mouth daily.   zolpidem 10 MG tablet Commonly known as:  AMBIEN Take 1 tablet (10 mg total) by mouth  at bedtime as needed. for sleep Notes to patient:  According to current guidelines, you should only take a half tablet in the evening as needed for sleep.  They are worried that a whole tablet is too strong.         Outstanding Labs/Studies   None  Duration of Discharge Encounter   Greater than 30 minutes including physician time.  Signed, Murray Hodgkins NP 05/24/2017, 6:29 PM   Attending Note:   The patient was seen and examined.  Agree with assessment and plan as noted above.  Changes made to the above note as needed.  Patient seen and independently examined with Ignacia Bayley, NP.   We discussed all aspects of the encounter. I agree with the assessment and plan as stated above.  1.  CAD :   Repeat cath showed patent grafts. She has a mismatch between her SVG - diag.    There was no stenosis that could be stented. Will continue with medical therapy. Follow up with Dr. [name].     I have spent a total of 40 minutes with patient reviewing hospital  notes , telemetry, EKGs, labs and examining patient as well as establishing an assessment and plan that was discussed with the patient. > 50% of time was spent in direct patient care.    Thayer Headings, Brooke Bonito., MD, [facility] 05/25/2017, 9:19 AM [address]  [place] Pager [phone]

## 2017-05-24 NOTE — Interval H&P Note (Signed)
Cath Lab Visit (complete for each Cath Lab visit)  Clinical Evaluation Leading to the Procedure:   ACS: Yes.    Non-ACS:    Anginal Classification: CCS IV  Anti-ischemic medical therapy: Minimal Therapy (1 class of medications)  Non-Invasive Test Results: High-risk stress test findings: cardiac mortality >3%/year  Prior CABG: Previous CABG      History and Physical Interval Note:  05/24/2017 9:58 AM  Stacey [name]  has presented today for surgery, with the diagnosis of NSTEMI  The various methods of treatment have been discussed with the patient and family. After consideration of risks, benefits and other options for treatment, the patient has consented to  Procedure(s): LEFT HEART CATH AND CORS/GRAFTS ANGIOGRAPHY (N/A) as a surgical intervention .  The patient's history has been reviewed, patient examined, no change in status, stable for surgery.  I have reviewed the patient's chart and labs.  Questions were answered to the patient's satisfaction.     Sherren Mocha

## 2017-05-24 NOTE — Discharge Instructions (Signed)

## 2017-05-25 DIAGNOSIS — I639 Cerebral infarction, unspecified: Secondary | ICD-10-CM

## 2017-05-25 HISTORY — DX: Cerebral infarction, unspecified: I63.9

## 2017-06-01 ENCOUNTER — Ambulatory Visit (INDEPENDENT_AMBULATORY_CARE_PROVIDER_SITE_OTHER): Payer: Medicare Other | Admitting: Family Medicine

## 2017-06-01 VITALS — BP 118/62 | HR 70 | Temp 98.8°F | Resp 16 | Wt 171.2 lb

## 2017-06-01 DIAGNOSIS — Z09 Encounter for follow-up examination after completed treatment for conditions other than malignant neoplasm: Secondary | ICD-10-CM

## 2017-06-01 DIAGNOSIS — M158 Other polyosteoarthritis: Secondary | ICD-10-CM

## 2017-06-01 DIAGNOSIS — I10 Essential (primary) hypertension: Secondary | ICD-10-CM | POA: Diagnosis not present

## 2017-06-01 DIAGNOSIS — I25709 Atherosclerosis of coronary artery bypass graft(s), unspecified, with unspecified angina pectoris: Secondary | ICD-10-CM | POA: Diagnosis not present

## 2017-06-01 DIAGNOSIS — F5101 Primary insomnia: Secondary | ICD-10-CM

## 2017-06-01 DIAGNOSIS — F3342 Major depressive disorder, recurrent, in full remission: Secondary | ICD-10-CM | POA: Diagnosis not present

## 2017-06-01 DIAGNOSIS — I209 Angina pectoris, unspecified: Secondary | ICD-10-CM | POA: Diagnosis not present

## 2017-06-01 MED ORDER — ZOLPIDEM TARTRATE 5 MG PO TABS: 5.0000 mg | ORAL_TABLET | Freq: Every evening | ORAL | 5 refills | Status: DC | PRN

## 2017-06-01 MED ORDER — CELECOXIB 200 MG PO CAPS: 200.0000 mg | ORAL_CAPSULE | Freq: Every day | ORAL | 5 refills | Status: DC

## 2017-06-01 MED ORDER — ROSUVASTATIN CALCIUM 40 MG PO TABS: 40.0000 mg | ORAL_TABLET | Freq: Every day | ORAL | 3 refills | Status: DC

## 2017-06-01 NOTE — Progress Notes (Signed)
Stacey [name]  MRN: [id number] DOB: 1941/02/21  Subjective:  HPI  Patient is here for follow up. Last office visit was on 04/27/17. Hypertension: Lisinopril was increased to 40 mg daily. Since that visit patient was at the hospital and dates were 05/22/17-05/24/17. Diagnoses were chest pain. HTN, hyperlipidemia, abnormal nuclear stress test. Cardiac cath was done. No medications were changed. She went any due to not feeling well, was having chest pressure and then had an episode of almost everything she saw was turning black. Patient is doing better. She has not had any more episodes of that since that day.  BP Readings from Last 3 Encounters:  06/01/17 118/62  05/24/17 134/77  04/27/17 (!) 148/68   Patient Active Problem List   Diagnosis Date Noted  . Abnormal nuclear stress test 05/24/2017  . 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 PAF (paroxysmal atrial fibrillation) ([place])   . Non-STEMI (non-ST elevated myocardial infarction) ([place]) 02/20/2017  . 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 Moderate mitral insufficiency 01/11/2017  . Personal history of colonic polyps   . Benign neoplasm of ascending colon   . Benign neoplasm of descending colon   . Chest pain, moderate coronary artery risk 09/16/2015  . Disequilibrium 09/16/2015  . Panic attacks 09/11/2015  . Ganglion cyst 08/16/2015  . Hypernatremia 07/04/2015  . Upper back pain 07/04/2015  . Other osteoarthritis involving multiple joints 02/06/2015  . Clinical depression 09/27/2014  . Dry mouth 09/27/2014  . Fibrositis 09/27/2014  . LBP (low back pain) 09/27/2014  . Avitaminosis D 09/27/2014  . Decreased leukocytes 09/27/2014  . Abnormal blood sugar 05/03/2009  . Insomnia 09/29/2007  . HTN (hypertension) 03/09/2007  . Adaptation reaction 01/29/2007  . Acid reflux 10/07/2006  . Menopausal and postmenopausal disorder 10/05/2006  . Headache, migraine 10/05/2006  . Arthritis,  degenerative 03/23/1994  . Adult hypothyroidism 04/15/1993  . Hyperlipidemia LDL goal <70 03/25/1993    Past Medical History:  Diagnosis Date  . Abnormal nuclear stress test 05/24/2017  . Arthritis   . Asthma   . 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 Cancer ([place])   . Complication of anesthesia   . Depression   . Family history of adverse reaction to anesthesia    most of family - PONV  . Fibromyalgia   . Glaucoma   . Headache    migraines - 1-2x/mo  . HTN (hypertension) 03/09/2007  . Hyperlipidemia LDL goal <70 03/25/1993  . Hypertension   . Motion sickness    all moving vehicles  . PONV (postoperative nausea and vomiting)   . Thyroid disease     Social History   Socioeconomic History  . Marital status: Married    Spouse name: [name]  . Number of children: 3  . Years of education: Not on file  . Highest education level: Not on file  Social Needs  . Financial resource strain: Not on file  . Food insecurity - worry: Not on file  . Food insecurity - inability: Not on file  . Transportation needs - medical: Not on file  . Transportation needs - non-medical: Not on file  Occupational History    Employer: [employer]  Tobacco Use  . Smoking status: Former Smoker    Last attempt to quit: 05/26/1975    Years since quitting: 42.0  . Smokeless tobacco: Never Used  . Tobacco comment: quit 45  years ago  Substance and Sexual Activity  . Alcohol use: No  . Drug use: No  . Sexual activity: No  Other Topics Concern  . Not on file  Social History Narrative  . Not on file    Outpatient Encounter Medications as of 06/01/2017  Medication Sig Note  . acetaminophen (TYLENOL) 325 MG tablet Take 2 tablets (650 mg total) by mouth every 6 (six) hours as needed for mild pain.   Marland Kitchen albuterol (VENTOLIN HFA) 108 (90 Base) MCG/ACT inhaler INHALE 2 PUFFS BY MOUTH EVERY 4 TO 6 HOURS AS NEEDED FOR SHORTNESS OF BREATH   . amLODipine (NORVASC)  5 MG tablet Take 1 tablet (5 mg total) daily by mouth.   Marland Kitchen aspirin 81 MG tablet ASPIRIN, 81MG  (Oral Tablet Delayed Release)  1 Every Day for 0 days  Quantity: 0.00;  Refills: 0   Ordered :17-November-2010  [name] ;  Started 05-Oct-2006 Active Comments: DX: 401.9   . buPROPion (WELLBUTRIN XL) 300 MG 24 hr tablet Take 1 tablet (300 mg total) by mouth daily.   . capsaicin (ZOSTRIX) 0.025 % cream Apply 1 application topically as needed (nerve pain in chest).   . Cholecalciferol (VITAMIN D) 2000 units tablet Take 2,000 Units by mouth daily.   . Cyanocobalamin (B-12) 500 MCG TABS Take by mouth.   . diazepam (VALIUM) 10 MG tablet Take 1 tablet (10 mg total) by mouth as needed.   Marland Kitchen EPINEPHrine (EPIPEN 2-PAK) 0.3 mg/0.3 mL IJ SOAJ injection INJECT AS DIRECTED FOR SEVERE ALLERGIC REACTION   . levothyroxine (SYNTHROID, LEVOTHROID) 100 MCG tablet Take 1 tablet (100 mcg total) by mouth daily.   Marland Kitchen lisinopril (PRINIVIL,ZESTRIL) 40 MG tablet Take 1 tablet (40 mg total) by mouth daily.   . metoprolol tartrate (LOPRESSOR) 25 MG tablet Take 1 tablet (25 mg total) by mouth 2 (two) times daily.   . Multiple Vitamin (MULTI-VITAMINS) TABS Take 1 tablet by mouth daily.   Marland Kitchen oxymetazoline (AFRIN) 0.05 % nasal spray Place 1 spray into both nostrils as needed for congestion.   . rosuvastatin (CRESTOR) 10 MG tablet Take 1 tablet (10 mg total) by mouth daily.   Marland Kitchen zolpidem (AMBIEN) 10 MG tablet Take 1 tablet (10 mg total) by mouth at bedtime as needed. for sleep 05/22/2017: Patient took 1/2 tablet last night (5mg  dose)  . traMADol (ULTRAM) 50 MG tablet Take 1 tablet (50 mg total) by mouth every 6 (six) hours as needed for moderate pain. (Patient not taking: Reported on 05/22/2017)    No facility-administered encounter medications on file as of 06/01/2017.     Allergies  Allergen Reactions  . Cephalexin Anaphylaxis  . Atorvastatin   . Morphine And Related Hives  . Novocain [Procaine]     Chest pain  . Shellfish Allergy  Swelling    angioedema  . Statins Other (See Comments)    Muscle pain  . Tomato     (by testing) - also beans, wheat, peas  . Penicillins Hives and Rash    Review of Systems  Constitutional: Negative.   Respiratory: Negative.   Cardiovascular: Negative.   Musculoskeletal: Positive for back pain and joint pain.  Neurological: Positive for dizziness (sometimes).    Objective:  BP 118/62   Pulse 70   Temp 98.8 F (37.1 C)   Resp 16   Wt 171 lb 3.2 oz (77.7 kg)   SpO2 95%   BMI 31.82 kg/m   Physical Exam  Constitutional: She is oriented to person, place, and time and  well-developed, well-nourished, and in no distress.  HENT:  Head: Normocephalic and atraumatic.  Eyes: Conjunctivae are normal. Pupils are equal, round, and reactive to light.  Cardiovascular: Normal rate, regular rhythm, normal heart sounds and intact distal pulses. Exam reveals no gallop.  No murmur heard. Pulmonary/Chest: Effort normal and breath sounds normal. No respiratory distress. She has no wheezes.  Musculoskeletal: She exhibits edema (trace). She exhibits no tenderness.  Neurological: She is alert and oriented to person, place, and time.  Psychiatric: Mood, memory, affect and judgment normal.    Assessment and Plan :  1. Hospital discharge follow-up Records reviewed, medications reconciled.  2. Essential hypertension Better. Discussed with patient that as long as patient not getting hypotensive symptoms/episodes will keep current medication as is. May need to change this regimen if she starts not feeling well with b/p readings.  3. Recurrent major depressive disorder, in full remission ([place]) Stable.  4. Coronary artery disease involving coronary bypass graft of native heart with angina pectoris ([place]) 5. OA multiple joints Patient is off Eliquis. Patient advised she can take Celebrex 2 to 3 times a week no more than that. 6. Insomnia Discussed the risks of taking Ambien long term and decreasing  use of this medication. Refill given today. 7. Hyperlipidemia Increase Crestor to 40 mg and re check in 1 month. Need to get LDL less than 70.  HPI, Exam and A&P transcribed by Tiffany Kocher, RMA under direction and in the presence of Miguel Aschoff, MD. I have done the exam and reviewed the above chart and it is accurate to the best of my knowledge. Development worker, community has been used in this note in any air is in the dictation or transcription are unintentional.

## 2017-06-02 ENCOUNTER — Other Ambulatory Visit: Payer: Self-pay | Admitting: Family Medicine

## 2017-06-11 ENCOUNTER — Encounter: Payer: Self-pay | Admitting: Family Medicine

## 2017-07-01 ENCOUNTER — Ambulatory Visit: Payer: Medicare Other | Admitting: Family Medicine

## 2017-07-01 DIAGNOSIS — E782 Mixed hyperlipidemia: Secondary | ICD-10-CM | POA: Diagnosis not present

## 2017-07-01 DIAGNOSIS — M5431 Sciatica, right side: Secondary | ICD-10-CM | POA: Diagnosis not present

## 2017-07-01 DIAGNOSIS — I10 Essential (primary) hypertension: Secondary | ICD-10-CM | POA: Diagnosis not present

## 2017-07-01 DIAGNOSIS — I25810 Atherosclerosis of coronary artery bypass graft(s) without angina pectoris: Secondary | ICD-10-CM | POA: Diagnosis not present

## 2017-07-02 DIAGNOSIS — M5431 Sciatica, right side: Secondary | ICD-10-CM | POA: Insufficient documentation

## 2017-07-08 ENCOUNTER — Encounter: Payer: Self-pay | Admitting: Family Medicine

## 2017-07-08 ENCOUNTER — Ambulatory Visit (INDEPENDENT_AMBULATORY_CARE_PROVIDER_SITE_OTHER): Payer: Medicare Other | Admitting: Family Medicine

## 2017-07-08 VITALS — BP 132/80 | HR 80 | Temp 98.3°F | Resp 16 | Wt 165.0 lb

## 2017-07-08 DIAGNOSIS — E785 Hyperlipidemia, unspecified: Secondary | ICD-10-CM | POA: Diagnosis not present

## 2017-07-08 DIAGNOSIS — M791 Myalgia, unspecified site: Secondary | ICD-10-CM | POA: Diagnosis not present

## 2017-07-08 DIAGNOSIS — I25709 Atherosclerosis of coronary artery bypass graft(s), unspecified, with unspecified angina pectoris: Secondary | ICD-10-CM

## 2017-07-08 DIAGNOSIS — I10 Essential (primary) hypertension: Secondary | ICD-10-CM | POA: Diagnosis not present

## 2017-07-08 NOTE — Progress Notes (Signed)
Patient: Stacey [name] Female    DOB: [birth year]   77 y.o.   MRN: [id number] Visit Date: 07/08/2017  Today's Provider: Wilhemena Durie, MD   Chief Complaint  Patient presents with  . Hyperlipidemia  . Hypertension   Subjective:    HPI  Lipid/Cholesterol, Follow-up:   Last seen for this4 weeks ago.  Management changes since that visit include increased Crestor to 40mg  daily. . Last Lipid Panel:    Component Value Date/Time   CHOL 165 04/29/2017 0801   CHOL 174 12/18/2016 0843   TRIG 134 04/29/2017 0801   HDL 56 04/29/2017 0801   HDL 54 12/18/2016 0843   CHOLHDL 2.9 04/29/2017 0801   VLDL 19 02/20/2017 0738   LDLCALC 106 (H) 02/20/2017 0738   LDLCALC 104 (H) 12/18/2016 0843    Risk factors for vascular disease include hypertension  She reports good compliance with treatment. She is having side effects. SHe reports that she has muscle aches all over.   Weight trend: stable Prior visit with dietician: no Current diet: well balanced Current exercise: none  Wt Readings from Last 3 Encounters:  07/08/17 165 lb (74.8 kg)  06/01/17 171 lb 3.2 oz (77.7 kg)  05/24/17 165 lb 5.5 oz (75 kg)         Allergies  Allergen Reactions  . Cephalexin Anaphylaxis  . Atorvastatin   . Morphine And Related Hives  . Novocain [Procaine]     Chest pain  . Shellfish Allergy Swelling    angioedema  . Statins Other (See Comments)    Muscle pain  . Tomato     (by testing) - also beans, wheat, peas  . Penicillins Hives and Rash     Current Outpatient Medications:  .  acetaminophen (TYLENOL) 325 MG tablet, Take 2 tablets (650 mg total) by mouth every 6 (six) hours as needed for mild pain., Disp: , Rfl:  .  albuterol (VENTOLIN HFA) 108 (90 Base) MCG/ACT inhaler, INHALE 2 PUFFS BY MOUTH EVERY 4 TO 6 HOURS AS NEEDED FOR SHORTNESS OF BREATH, Disp: 18 g, Rfl: 5 .  amLODipine (NORVASC) 5 MG tablet, Take 1 tablet (5 mg total) daily by mouth., Disp: 90 tablet, Rfl: 3 .   aspirin 81 MG tablet, ASPIRIN, 81MG  (Oral Tablet Delayed Release)  1 Every Day for 0 days  Quantity: 0.00;  Refills: 0   Ordered :17-November-2010  [name] ;  Started 05-Oct-2006 Active Comments: DX: 401.9, Disp: , Rfl:  .  buPROPion (WELLBUTRIN XL) 300 MG 24 hr tablet, Take 1 tablet (300 mg total) by mouth daily., Disp: 90 tablet, Rfl: 3 .  capsaicin (ZOSTRIX) 0.025 % cream, Apply 1 application topically as needed (nerve pain in chest)., Disp: , Rfl:  .  celecoxib (CELEBREX) 200 MG capsule, Take 1 capsule (200 mg total) by mouth daily., Disp: 30 capsule, Rfl: 5 .  Cholecalciferol (VITAMIN D) 2000 units tablet, Take 2,000 Units by mouth daily., Disp: , Rfl:  .  Cyanocobalamin (B-12) 500 MCG TABS, Take by mouth., Disp: , Rfl:  .  diazepam (VALIUM) 10 MG tablet, Take 1 tablet (10 mg total) by mouth as needed., Disp: 90 tablet, Rfl: 1 .  EPINEPHrine (EPIPEN 2-PAK) 0.3 mg/0.3 mL IJ SOAJ injection, INJECT AS DIRECTED FOR SEVERE ALLERGIC REACTION, Disp: 1 Device, Rfl: 1 .  levothyroxine (SYNTHROID, LEVOTHROID) 100 MCG tablet, Take 1 tablet (100 mcg total) by mouth daily., Disp: 90 tablet, Rfl: 3 .  lisinopril (PRINIVIL,ZESTRIL) 40 MG  tablet, Take 1 tablet (40 mg total) by mouth daily., Disp: 90 tablet, Rfl: 3 .  metoprolol tartrate (LOPRESSOR) 25 MG tablet, Take 1 tablet (25 mg total) by mouth 2 (two) times daily., Disp: 60 tablet, Rfl: 11 .  Multiple Vitamin (MULTI-VITAMINS) TABS, Take 1 tablet by mouth daily., Disp: , Rfl:  .  oxymetazoline (AFRIN) 0.05 % nasal spray, Place 1 spray into both nostrils as needed for congestion., Disp: , Rfl:  .  rosuvastatin (CRESTOR) 40 MG tablet, Take 1 tablet (40 mg total) by mouth daily., Disp: 90 tablet, Rfl: 3 .  traMADol (ULTRAM) 50 MG tablet, Take 1 tablet (50 mg total) by mouth every 6 (six) hours as needed for moderate pain., Disp: 30 tablet, Rfl: 0 .  zolpidem (AMBIEN) 5 MG tablet, Take 1 tablet (5 mg total) by mouth at bedtime as needed. for sleep, Disp: 30  tablet, Rfl: 5  Review of Systems  Constitutional: Negative.   Eyes: Negative.   Respiratory: Negative.   Cardiovascular: Negative.   Gastrointestinal: Negative.   Endocrine: Negative.   Musculoskeletal: Positive for myalgias. Negative for arthralgias.  Allergic/Immunologic: Negative.   Psychiatric/Behavioral: Negative.     Social History   Tobacco Use  . Smoking status: Former Smoker    Last attempt to quit: 05/26/1975    Years since quitting: 42.1  . Smokeless tobacco: Never Used  . Tobacco comment: quit 45  years ago   Substance Use Topics  . Alcohol use: No   Objective:   BP 132/80   Pulse 80   Temp 98.3 F (36.8 C)   Resp 16   Wt 165 lb (74.8 kg)   BMI 30.67 kg/m  Vitals:   07/08/17 1653  BP: 132/80  Pulse: 80  Resp: 16  Temp: 98.3 F (36.8 C)  Weight: 165 lb (74.8 kg)     Physical Exam  Constitutional: She is oriented to person, place, and time. She appears well-developed and well-nourished.  HENT:  Head: Normocephalic and atraumatic.  Right Ear: External ear normal.  Left Ear: External ear normal.  Nose: Nose normal.  Eyes: Conjunctivae are normal. No scleral icterus.  Cardiovascular: Normal rate, regular rhythm and normal heart sounds.  Pulmonary/Chest: Effort normal and breath sounds normal.  Abdominal: Soft.  Neurological: She is alert and oriented to person, place, and time.  Skin: Skin is warm and dry.  Psychiatric: She has a normal mood and affect. Her behavior is normal. Judgment and thought content normal.        Assessment & Plan:     1. Essential hypertension  - Comprehensive metabolic panel  2. Hyperlipidemia LDL goal <70  - Lipid panel  3. Myalgia  - CK 4.CAD/s/p CABG  I have done the exam and reviewed the chart and it is accurate to the best of my knowledge. Development worker, community has been used and  any errors in dictation or transcription are unintentional. Miguel Aschoff M.D. [place], MD  [facility]

## 2017-07-12 DIAGNOSIS — M791 Myalgia, unspecified site: Secondary | ICD-10-CM | POA: Diagnosis not present

## 2017-07-12 DIAGNOSIS — E785 Hyperlipidemia, unspecified: Secondary | ICD-10-CM | POA: Diagnosis not present

## 2017-07-12 DIAGNOSIS — I10 Essential (primary) hypertension: Secondary | ICD-10-CM | POA: Diagnosis not present

## 2017-07-13 LAB — COMPREHENSIVE METABOLIC PANEL
ALBUMIN: 4.3 g/dL (ref 3.5–4.8)
ALK PHOS: 81 IU/L (ref 39–117)
ALT: 12 IU/L (ref 0–32)
AST: 16 IU/L (ref 0–40)
Albumin/Globulin Ratio: 2 (ref 1.2–2.2)
BILIRUBIN TOTAL: 0.3 mg/dL (ref 0.0–1.2)
BUN / CREAT RATIO: 17 (ref 12–28)
BUN: 15 mg/dL (ref 8–27)
CHLORIDE: 106 mmol/L (ref 96–106)
CO2: 23 mmol/L (ref 20–29)
Calcium: 8.9 mg/dL (ref 8.7–10.3)
Creatinine, Ser: 0.9 mg/dL (ref 0.57–1.00)
GFR calc Af Amer: 73 mL/min/{1.73_m2} (ref >59)
GFR calc non Af Amer: 63 mL/min/{1.73_m2} (ref >59)
GLUCOSE: 113 mg/dL — AB (ref 65–99)
Globulin, Total: 2.2 g/dL (ref 1.5–4.5)
Potassium: 4.3 mmol/L (ref 3.5–5.2)
Sodium: 144 mmol/L (ref 134–144)
Total Protein: 6.5 g/dL (ref 6.0–8.5)

## 2017-07-13 LAB — LIPID PANEL
Chol/HDL Ratio: 2.9 ratio (ref 0.0–4.4)
Cholesterol, Total: 155 mg/dL (ref 100–199)
HDL: 54 mg/dL (ref >39)
LDL CALC: 71 mg/dL (ref 0–99)
Triglycerides: 148 mg/dL (ref 0–149)
VLDL CHOLESTEROL CAL: 30 mg/dL (ref 5–40)

## 2017-07-13 LAB — CK: CK TOTAL: 52 U/L (ref 24–173)

## 2017-07-26 ENCOUNTER — Encounter: Payer: Self-pay | Admitting: General Surgery

## 2017-07-30 DIAGNOSIS — M1611 Unilateral primary osteoarthritis, right hip: Secondary | ICD-10-CM | POA: Insufficient documentation

## 2017-07-30 DIAGNOSIS — M545 Low back pain: Secondary | ICD-10-CM | POA: Diagnosis not present

## 2017-08-05 ENCOUNTER — Encounter: Payer: Self-pay | Admitting: Physical Therapy

## 2017-08-05 ENCOUNTER — Other Ambulatory Visit: Payer: Self-pay

## 2017-08-05 ENCOUNTER — Ambulatory Visit: Payer: Medicare Other | Attending: Orthopedic Surgery | Admitting: Physical Therapy

## 2017-08-05 DIAGNOSIS — M25551 Pain in right hip: Secondary | ICD-10-CM | POA: Insufficient documentation

## 2017-08-05 DIAGNOSIS — R262 Difficulty in walking, not elsewhere classified: Secondary | ICD-10-CM

## 2017-08-05 DIAGNOSIS — M5440 Lumbago with sciatica, unspecified side: Secondary | ICD-10-CM | POA: Diagnosis not present

## 2017-08-05 DIAGNOSIS — M6281 Muscle weakness (generalized): Secondary | ICD-10-CM | POA: Diagnosis not present

## 2017-08-06 NOTE — Addendum Note (Signed)
Addended by: Aldona Lento on: 08/06/2017 09:27 PM   Modules accepted: Orders

## 2017-08-06 NOTE — Therapy (Addendum)
[facility] [address] [place], [address] Phone: [phone]   Fax:  [phone]  Physical Therapy Evaluation  Patient Details  Name: Stacey [name] MRN: [id number] Date of Birth: 1940-10-26 Referring Provider: [name] PA-C   Encounter Date: 08/05/2017  PT End of Session - 08/05/17 2000    Visit Number  1    Number of Visits  12    Date for PT Re-Evaluation  09/16/17    Authorization Type  1 of 6 FOTO    PT Start Time  1820    PT Stop Time  1930    PT Time Calculation (min)  70 min    Activity Tolerance  Patient limited by pain    Behavior During Therapy  [facility] for tasks assessed/performed       Past Medical History:  Diagnosis Date  . Abnormal nuclear stress test 05/24/2017  . Arthritis   . Asthma   . 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 Cancer ([place])   . Complication of anesthesia   . Depression   . Family history of adverse reaction to anesthesia    most of family - PONV  . Fibromyalgia   . Glaucoma   . Headache    migraines - 1-2x/mo  . HTN (hypertension) 03/09/2007  . Hyperlipidemia LDL goal <70 03/25/1993  . Hypertension   . Motion sickness    all moving vehicles  . PONV (postoperative nausea and vomiting)   . Thyroid disease     Past Surgical History:  Procedure Laterality Date  . ABDOMINAL HYSTERECTOMY    . APPENDECTOMY    . BLADDER SURGERY    . CATARACT EXTRACTION W/ INTRAOCULAR LENS  IMPLANT, BILATERAL    . COLONOSCOPY WITH PROPOFOL N/A 01/17/2016   Procedure: COLONOSCOPY WITH PROPOFOL;  Surgeon: [name], MD;  Location: [place];  Service: Endoscopy;  Laterality: N/A;  . CORONARY ARTERY BYPASS GRAFT N/A 02/20/2017   Procedure: CORONARY ARTERY BYPASS GRAFTING (CABG) x , three using left internal mammary artery to left anterior descending coronary artery and right greater saphenous vein harvested endoscopically  to distal right and diagonal coronary arteries.;  Surgeon: Grace Isaac, MD;  Location: [place];  Service: Open Heart Surgery;  Laterality: N/A;  . LEFT HEART CATH AND CORONARY ANGIOGRAPHY N/A 02/20/2017   Procedure: LEFT HEART CATH AND CORONARY ANGIOGRAPHY;  Surgeon: Isaias Cowman, MD;  Location: [place] [facility];  Service: Cardiovascular;  Laterality: N/A;  . LEFT HEART CATH AND CORS/GRAFTS ANGIOGRAPHY N/A 05/24/2017   Procedure: LEFT HEART CATH AND CORS/GRAFTS ANGIOGRAPHY;  Surgeon: Sherren Mocha, MD;  Location: [place] [facility];  Service: Cardiovascular;  Laterality: N/A;  . NASAL SINUS SURGERY    . POLYPECTOMY  01/17/2016   Procedure: POLYPECTOMY;  Surgeon: [name], MD;  Location: [place];  Service: Endoscopy;;  . RIGHT OOPHORECTOMY    . TEE WITHOUT CARDIOVERSION N/A 02/20/2017   Procedure: TRANSESOPHAGEAL ECHOCARDIOGRAM (TEE);  Surgeon: Grace Isaac, MD;  Location: [place];  Service: Open Heart Surgery;  Laterality: N/A;    There were no vitals filed for this visit.   Subjective Assessment - 08/05/17 1839    Subjective  patient reports pain is across her lower back into right hip> left. she is having difficulty walking and transitions in bed and transfers in/out of bed and car.     Pertinent History  long history of back pain with arthritis with episodes of exacerbation for at least the past 10 years. Previous therapy for back pain with good results for pain control. Recently had MI with CABG x 3 02/20/2017 and she is recuperating from this and has been more sedentary.     Limitations  Sitting;Lifting;Standing;Walking;House hold activities;Other (comment) personal care, getting in/out of car    How long can you sit comfortably?  < 20 min.    How long can you stand comfortably?  unable to tolerated standing at all in one position    How long can you walk comfortably?  walking is painful and she has to exercise due to recent MI    Diagnostic tests  X  rays:     Patient Stated Goals  to get back to do things in moderation with less pain, be able to walk, sit, drive comfortably    Currently in Pain?  Yes    Pain Score  7  best 5/10; worst 10/10    Pain Location  Back    Pain Orientation  Left;Right;Lower    Pain Descriptors / Indicators  Aching;Spasm;Sore;Tightness;Throbbing;Tender    Pain Type  Chronic pain    Pain Onset  More than a month ago    Pain Frequency  Constant    Aggravating Factors   sitting, standing, walking    Pain Relieving Factors  lying down with legs up    Effect of Pain on Daily Activities  limits all ADL's         [facility] PT Assessment - 08/05/17 1830      Assessment   Medical Diagnosis  right hip pain osteoarthritis and back pain    Referring Provider  [name] PA-C    Onset Date/Surgical Date  08/23/16    Hand Dominance  Right    Prior Therapy  yes      Precautions   Precautions  Other (comment)    Precaution Comments  had cardiac surgery 02/20/2017      Restrictions   Weight Bearing Restrictions  No      Balance Screen   Has the patient fallen in the past 6 months  No      [place] residence    Living Arrangements  Spouse/significant other    Type of [place] to enter    Entrance Stairs-Number of Steps  3    Entrance Stairs-Rails  Right;Left    Home Layout  Two level    Alternate Level Stairs-Number of Steps  12    Alternate Level Stairs-Rails  Right;Left      Prior Function   Level of Independence  Independent    Vocation  Self employed    [place] Requirements  daycare provider, 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    Leisure  family, cruises, outdoor activities, travel, puzzle books      Cognition   Overall Cognitive Status  Within Functional Limits for tasks assessed      Observation/Other Assessments   Focus on Therapeutic Outcomes (FOTO)   22/100    Other Surveys   -- 10MW 13.75 seconds:  8-12 seconds WNL for community ambulator      Posture/Postural Control   Posture Comments  forward flexed, guarded      ROM / Strength   AROM / PROM / Strength  AROM;Strength      AROM  Overall AROM Comments  limited right hip flexion 50% with pain, unable to raise right LE without assistance; lumbar spine limited severely flexion/extension with increased back pain      Strength   Overall Strength Comments  right LE hip flexion painful unable to test; knee extension painful with resistance, knee flexion painful with resistance, ankle DF and great tow extension WFL; left LE grossly WFL throughout major muslce groups      Palpation   Palpation comment  point tender over lower back into right hip      FABER test   findings  Positive    Side  Right    Comment  reproduction of right hip/lower back pain      Slump test   Comment  unclear       Straight Leg Raise   Comment  painful bilaterally with back pain generalized      Ambulation/Gait   Gait Comments  guarded, antalgic, limp on right      patient transferred on and off treatment table with slow cautious movement, kneeling onto table  Sit to stand from chair, using UE's for support, pain on rising from sit and trying to stand erect Decreased weight bearing right LE on standing and slow, cautious movement to initiate walking    Objective measurements completed on examination: See above findings.   Treatment:  Modalities: Electrical stimulation: 15 min: High volt estim.clincial program for muscle spasms  (4) electrodes applied to lower back bilaterally intensity to tolerance with patient in reclined position with LE's supported on wedge goal: pain, spasms Ice pack applied to right side lower back, hip region in conjunction with estim; goal: pain no adverse reaction noted   Patient response to treatment: patient reported decreased pain and able to transfer off treatment table and stand with less difficulty and initiate  stepping more quickly following treatment     PT Education - 08/05/17 2015    Education provided  Yes    Education Details  FOTO score, goals, body mechanics, use of lumbar roll, sleeping position    Person(s) Educated  Patient    Methods  Explanation; handout given for body mechanics, posture   Comprehension  Verbalized understanding          PT Long Term Goals - 08/05/17 2027      PT LONG TERM GOAL #1   Title  improve FOTO to 30/100 demonstrating improved function with daily tasks    Baseline  FOTO 22/100    Status  New    Target Date  08/26/17      PT LONG TERM GOAL #2   Title  improve FOTO to 40/100 demonstrating improved function with daily tasks    Baseline  FOTO 22/100    Status  New    Target Date  09/16/17      PT LONG TERM GOAL #3   Title  patient will be independent with home program for pain control, exercises in order to transition to self management once discharged from physical therapy    Baseline  limited knowledge of appropriate pain control, exercise progression without guidance, cuing    Status  New    Target Date  09/16/17             Plan - 08/05/17 2000    Clinical Impression Statement  Patient is a 77 year old female who presents with exacerbation of back pain that began about a year ago and has worsened since September following cardiac surgery  after which she was more sedentary. She currently has back and right hip pain that is worse with movement and walking.  She has FOTO score of 22 indicating severe impairment with daily tasks. She has limited knowledge of appropriate pain control strategies and exercise progression and will benefit from physical therapy intervention to improve function with less pain.     History and Personal Factors relevant to plan of care:  long history of back pain with arthritis with episodes of exacerbation for at least the past 10 years. Previous therapy for back pain with good results for pain control. Recently had  MI with CABG x 3 02/20/2017 and she is recuperating from this and has been more sedentary. 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     Clinical Presentation  Evolving    Clinical Presentation due to:  worseing symptoms with decreased function with ADL's    Clinical Decision Making  Moderate    Rehab Potential  Fair    Clinical Impairments Affecting Rehab Potential  (+)motivated, acute on chronic pain, active lifestyle prior to this episode(-)chronic condition, pain, recent CABG x 3 02/20/2017, arthritis, HTN    PT Frequency  2x / week    PT Duration  6 weeks    PT Treatment/Interventions  Cryotherapy;Electrical Stimulation;Moist Heat;Ultrasound;Therapeutic exercise;Therapeutic activities;Neuromuscular re-education;Patient/family education;Manual techniques    PT Next Visit Plan  pain control, exercise progression as needed    PT Home Exercise Plan  pain cotnrol, body mechanics, use of lumbar support for sitting, sleeping    Consulted and Agree with Plan of Care  Patient       Patient will benefit from skilled therapeutic intervention in order to improve the following deficits and impairments:  Pain, Improper body mechanics, Increased muscle spasms, Decreased activity tolerance, Decreased endurance, Decreased range of motion, Decreased strength, Impaired perceived functional ability  Visit Diagnosis: Pain in right hip - Plan: PT plan of care cert/re-cert  Low back pain with sciatica, sciatica laterality unspecified, unspecified back pain laterality, unspecified chronicity - Plan: PT plan of care cert/re-cert  Muscle weakness (generalized) - Plan: PT plan of care cert/re-cert  Difficulty in walking, not elsewhere classified - Plan: PT plan of care cert/re-cert     Problem List Patient Active Problem List   Diagnosis Date Noted  . Abnormal nuclear stress test 05/24/2017  . Coronary artery disease involving coronary  bypass graft of native heart 03/15/2017  . PAF (paroxysmal atrial fibrillation) ([place])   . Non-STEMI (non-ST elevated myocardial infarction) ([place]) 02/20/2017  . 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 Moderate mitral insufficiency 01/11/2017  . Personal history of colonic polyps   . Benign neoplasm of ascending colon   . Benign neoplasm of descending colon   . Chest pain, moderate coronary artery risk 09/16/2015  . Disequilibrium 09/16/2015  . Panic attacks 09/11/2015  . Ganglion cyst 08/16/2015  . Hypernatremia 07/04/2015  . Upper back pain 07/04/2015  . Other osteoarthritis involving multiple joints 02/06/2015  . Clinical depression 09/27/2014  . Dry mouth 09/27/2014  . Fibrositis 09/27/2014  . LBP (low back pain) 09/27/2014  . Avitaminosis D 09/27/2014  . Decreased leukocytes 09/27/2014  . Abnormal blood sugar 05/03/2009  . Insomnia 09/29/2007  . HTN (hypertension) 03/09/2007  . Adaptation reaction 01/29/2007  . Acid reflux 10/07/2006  . Menopausal and postmenopausal disorder 10/05/2006  . Headache, migraine 10/05/2006  . Arthritis, degenerative 03/23/1994  . Adult  hypothyroidism 04/15/1993  . Hyperlipidemia LDL goal <70 03/25/1993    Jomarie Longs PT 08/06/2017, 8:54 PM  [facility] [address], [place], [address] Phone: [phone]   Fax:  [phone]  Name: NALIYA GISH MRN: [id number] Date of Birth: February 07, 1941

## 2017-08-09 ENCOUNTER — Ambulatory Visit: Payer: Medicare Other | Admitting: Physical Therapy

## 2017-08-09 ENCOUNTER — Encounter: Payer: Self-pay | Admitting: Physical Therapy

## 2017-08-09 DIAGNOSIS — R262 Difficulty in walking, not elsewhere classified: Secondary | ICD-10-CM | POA: Diagnosis not present

## 2017-08-09 DIAGNOSIS — M25551 Pain in right hip: Secondary | ICD-10-CM | POA: Diagnosis not present

## 2017-08-09 DIAGNOSIS — M5440 Lumbago with sciatica, unspecified side: Secondary | ICD-10-CM | POA: Diagnosis not present

## 2017-08-09 DIAGNOSIS — M6281 Muscle weakness (generalized): Secondary | ICD-10-CM | POA: Diagnosis not present

## 2017-08-09 NOTE — Therapy (Signed)
[facility] [address] [place], [address] Phone: [phone]   Fax:  [phone]  Physical Therapy Treatment  Patient Details  Name: Stacey [name] MRN: [id number] Date of Birth: 01/26/1941 Referring Provider: [name] PA-C   Encounter Date: 08/09/2017  PT End of Session - 08/09/17 1951    Visit Number  2    Number of Visits  12    Date for PT Re-Evaluation  09/16/17    Authorization Type  2 of 6 FOTO    PT Start Time  1945    PT Stop Time  2031    PT Time Calculation (min)  46 min    Activity Tolerance  Patient limited by pain    Behavior During Therapy  [facility] for tasks assessed/performed       Past Medical History:  Diagnosis Date  . Abnormal nuclear stress test 05/24/2017  . Arthritis   . Asthma   . 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 Cancer ([place])   . Complication of anesthesia   . Depression   . Family history of adverse reaction to anesthesia    most of family - PONV  . Fibromyalgia   . Glaucoma   . Headache    migraines - 1-2x/mo  . HTN (hypertension) 03/09/2007  . Hyperlipidemia LDL goal <70 03/25/1993  . Hypertension   . Motion sickness    all moving vehicles  . PONV (postoperative nausea and vomiting)   . Thyroid disease     Past Surgical History:  Procedure Laterality Date  . ABDOMINAL HYSTERECTOMY    . APPENDECTOMY    . BLADDER SURGERY    . CATARACT EXTRACTION W/ INTRAOCULAR LENS  IMPLANT, BILATERAL    . COLONOSCOPY WITH PROPOFOL N/A 01/17/2016   Procedure: COLONOSCOPY WITH PROPOFOL;  Surgeon: [name], MD;  Location: [place];  Service: Endoscopy;  Laterality: N/A;  . CORONARY ARTERY BYPASS GRAFT N/A 02/20/2017   Procedure: CORONARY ARTERY BYPASS GRAFTING (CABG) x , three using left internal mammary artery to left anterior descending coronary artery and right greater saphenous vein harvested endoscopically  to distal right and diagonal coronary arteries.;  Surgeon: Grace Isaac, MD;  Location: [place];  Service: Open Heart Surgery;  Laterality: N/A;  . LEFT HEART CATH AND CORONARY ANGIOGRAPHY N/A 02/20/2017   Procedure: LEFT HEART CATH AND CORONARY ANGIOGRAPHY;  Surgeon: Isaias Cowman, MD;  Location: [place] [facility];  Service: Cardiovascular;  Laterality: N/A;  . LEFT HEART CATH AND CORS/GRAFTS ANGIOGRAPHY N/A 05/24/2017   Procedure: LEFT HEART CATH AND CORS/GRAFTS ANGIOGRAPHY;  Surgeon: Sherren Mocha, MD;  Location: [place] [facility];  Service: Cardiovascular;  Laterality: N/A;  . NASAL SINUS SURGERY    . POLYPECTOMY  01/17/2016   Procedure: POLYPECTOMY;  Surgeon: [name], MD;  Location: [place];  Service: Endoscopy;;  . RIGHT OOPHORECTOMY    . TEE WITHOUT CARDIOVERSION N/A 02/20/2017   Procedure: TRANSESOPHAGEAL ECHOCARDIOGRAM (TEE);  Surgeon: Grace Isaac, MD;  Location: [place];  Service: Open Heart Surgery;  Laterality: N/A;    There were no vitals filed for this visit.  Subjective Assessment - 08/09/17 1946    Subjective  Patient continues with chief concern of pain in back and right hip region. pain in lower back and down into hip right side; whole hip; worse yesterday better today   Pertinent History  long history  of back pain with arthritis with episodes of exacerbation for at least the past 10 years. Previous therapy for back pain with good results for pain control. Recently had MI with CABG x 3 02/20/2017 and she is recuperating from this and has been more sedentary.     Limitations  Sitting;Lifting;Standing;Walking;House hold activities;Other (comment) personal care, getting in/out of car    How long can you sit comfortably?  < 20 min.    How long can you stand comfortably?  unable to tolerated standing at all in one position    How long can you walk comfortably?  walking is painful and she has to exercise due to recent MI    Diagnostic tests  X rays:      Patient Stated Goals  to get back to do things in moderation with less pain, be able to walk, sit, drive comfortably    Currently in Pain?  Yes    Pain Score  7     Pain Location  Back    Pain Orientation  Left;Right;Lower    Pain Descriptors / Indicators  Aching;Spasm;Sore;Tightness    Pain Type  Chronic pain    Pain Onset  More than a month ago    Pain Frequency  Constant       Objective: Gait: antalgic, slow cadence, decreased weight shift to right LE  Treatment: Therapeutic exercise: patient performed with guidance, assistance, demonstration and verbal cues of therapist: goal: improve ambulation with less pain, improve FOTO score, independent with home program, self management  Nustep x 5 min for LE exercise focus on leg press (no UE )BP pre exercise 155/82 SPO2 97%; HR 68 standing side stepping on balance beam, using UE's for support x 1 min. With close supervision /nonCG step ups onto balance pad x 10 each LE using UE support for balance   Modalities: Electrical stimulation: 20 min High volt estim.clincial program for muscle spasms  (2) electrodes applied to lower back right and right hip anterior/posterior aspect intensity to tolerance with patient sitting in chair on airex pad with moist heat to back (no adverse reactions noted): goal: pain, spasms  Patient response to treatment: patient demonstrated improved technique with exercises with minimal VC for correct alignment; unable to perform more exercises due to increased pain in right hip/LE and back to 8/10. Patient with decreased pain from  8/10 to 5 /10 following estim., moist heat.     PT Education - 08/09/17 1950    Education provided  Yes    Education Details  re assessed home program for body mechanics, sleeping position    Person(s) Educated  Patient    Methods  Explanation    Comprehension  Verbalized understanding          PT Long Term Goals - 08/05/17 2027      PT LONG TERM GOAL #1   Title  improve  FOTO to 30/100 demonstrating improved function with daily tasks    Baseline  FOTO 22/100    Status  New    Target Date  08/26/17      PT LONG TERM GOAL #2   Title  improve FOTO to 40/100 demonstrating improved function with daily tasks    Baseline  FOTO 22/100    Status  New    Target Date  09/16/17      PT LONG TERM GOAL #3   Title  patient will be independent with home program for pain control, exercises in order to transition to self  management once discharged from physical therapy    Baseline  limited knowledge of appropriate pain control, exercise progression without guidance, cuing    Status  New    Target Date  09/16/17            Plan - 08/09/17 2043    Clinical Impression Statement  Patient unable to perform much exercise due to increased pain in right hip and lower back. Pain decreased following estim/heat treatment. She continued with difficulty with standing and walking at end of session.     Rehab Potential  Fair    Clinical Impairments Affecting Rehab Potential  (+)motivated, acute on chronic pain, active lifestyle prior to this episode(-)chronic condition, pain, recent CABG x 3 02/20/2017, arthritis, HTN    PT Frequency  2x / week    PT Duration  6 weeks    PT Treatment/Interventions  Cryotherapy;Electrical Stimulation;Moist Heat;Ultrasound;Therapeutic exercise;Therapeutic activities;Neuromuscular re-education;Patient/family education;Manual techniques    PT Next Visit Plan  pain control, exercise progression as needed; electrical stimulation, moist heat    PT Home Exercise Plan  pain cotnrol, body mechanics, use of lumbar support for sitting, sleeping       Patient will benefit from skilled therapeutic intervention in order to improve the following deficits and impairments:  Pain, Improper body mechanics, Increased muscle spasms, Decreased activity tolerance, Decreased endurance, Decreased range of motion, Decreased strength, Impaired perceived functional  ability  Visit Diagnosis: Pain in right hip  Low back pain with sciatica, sciatica laterality unspecified, unspecified back pain laterality, unspecified chronicity  Muscle weakness (generalized)  Difficulty in walking, not elsewhere classified     Problem List Patient Active Problem List   Diagnosis Date Noted  . Abnormal nuclear stress test 05/24/2017  . 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 PAF (paroxysmal atrial fibrillation) ([place])   . Non-STEMI (non-ST elevated myocardial infarction) ([place]) 02/20/2017  . 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 Moderate mitral insufficiency 01/11/2017  . Personal history of colonic polyps   . Benign neoplasm of ascending colon   . Benign neoplasm of descending colon   . Chest pain, moderate coronary artery risk 09/16/2015  . Disequilibrium 09/16/2015  . Panic attacks 09/11/2015  . Ganglion cyst 08/16/2015  . Hypernatremia 07/04/2015  . Upper back pain 07/04/2015  . Other osteoarthritis involving multiple joints 02/06/2015  . Clinical depression 09/27/2014  . Dry mouth 09/27/2014  . Fibrositis 09/27/2014  . LBP (low back pain) 09/27/2014  . Avitaminosis D 09/27/2014  . Decreased leukocytes 09/27/2014  . Abnormal blood sugar 05/03/2009  . Insomnia 09/29/2007  . HTN (hypertension) 03/09/2007  . Adaptation reaction 01/29/2007  . Acid reflux 10/07/2006  . Menopausal and postmenopausal disorder 10/05/2006  . Headache, migraine 10/05/2006  . Arthritis, degenerative 03/23/1994  . Adult hypothyroidism 04/15/1993  . Hyperlipidemia LDL goal <70 03/25/1993    Jomarie Longs PT 08/10/2017, 9:49 PM  [facility] [address], [place], [address] Phone: [phone]   Fax:  [phone]  Name: Stacey [name] MRN: [id number] Date of Birth: Apr 09, 1941

## 2017-08-12 ENCOUNTER — Ambulatory Visit: Payer: Medicare Other | Admitting: Physical Therapy

## 2017-08-12 ENCOUNTER — Encounter: Payer: Self-pay | Admitting: Physical Therapy

## 2017-08-12 DIAGNOSIS — R262 Difficulty in walking, not elsewhere classified: Secondary | ICD-10-CM | POA: Diagnosis not present

## 2017-08-12 DIAGNOSIS — M25551 Pain in right hip: Secondary | ICD-10-CM | POA: Diagnosis not present

## 2017-08-12 DIAGNOSIS — M5440 Lumbago with sciatica, unspecified side: Secondary | ICD-10-CM | POA: Diagnosis not present

## 2017-08-12 DIAGNOSIS — M6281 Muscle weakness (generalized): Secondary | ICD-10-CM

## 2017-08-12 NOTE — Therapy (Signed)
[facility] [address] [place], [address] Phone: [phone]   Fax:  [phone]  Physical Therapy Treatment  Patient Details  Name: Stacey [name] MRN: [id number] Date of Birth: Feb 17, 1941 Referring Provider: [name] PA-C   Encounter Date: 08/12/2017  PT End of Session - 08/12/17 1826    Visit Number  3    Number of Visits  12    Date for PT Re-Evaluation  09/16/17    Authorization Type  3 of 6 FOTO    PT Start Time  1816    PT Stop Time  1905    PT Time Calculation (min)  49 min    Activity Tolerance  Patient limited by pain;Patient tolerated treatment well    Behavior During Therapy  [facility] for tasks assessed/performed       Past Medical History:  Diagnosis Date  . Abnormal nuclear stress test 05/24/2017  . Arthritis   . Asthma   . 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 Cancer ([place])   . Complication of anesthesia   . Depression   . Family history of adverse reaction to anesthesia    most of family - PONV  . Fibromyalgia   . Glaucoma   . Headache    migraines - 1-2x/mo  . HTN (hypertension) 03/09/2007  . Hyperlipidemia LDL goal <70 03/25/1993  . Hypertension   . Motion sickness    all moving vehicles  . PONV (postoperative nausea and vomiting)   . Thyroid disease     Past Surgical History:  Procedure Laterality Date  . ABDOMINAL HYSTERECTOMY    . APPENDECTOMY    . BLADDER SURGERY    . CATARACT EXTRACTION W/ INTRAOCULAR LENS  IMPLANT, BILATERAL    . COLONOSCOPY WITH PROPOFOL N/A 01/17/2016   Procedure: COLONOSCOPY WITH PROPOFOL;  Surgeon: [name], MD;  Location: [place];  Service: Endoscopy;  Laterality: N/A;  . CORONARY ARTERY BYPASS GRAFT N/A 02/20/2017   Procedure: CORONARY ARTERY BYPASS GRAFTING (CABG) x , three using left internal mammary artery to left anterior descending coronary artery and right greater  saphenous vein harvested endoscopically to distal right and diagonal coronary arteries.;  Surgeon: Grace Isaac, MD;  Location: [place];  Service: Open Heart Surgery;  Laterality: N/A;  . LEFT HEART CATH AND CORONARY ANGIOGRAPHY N/A 02/20/2017   Procedure: LEFT HEART CATH AND CORONARY ANGIOGRAPHY;  Surgeon: Isaias Cowman, MD;  Location: [place] [facility];  Service: Cardiovascular;  Laterality: N/A;  . LEFT HEART CATH AND CORS/GRAFTS ANGIOGRAPHY N/A 05/24/2017   Procedure: LEFT HEART CATH AND CORS/GRAFTS ANGIOGRAPHY;  Surgeon: Sherren Mocha, MD;  Location: [place] [facility];  Service: Cardiovascular;  Laterality: N/A;  . NASAL SINUS SURGERY    . POLYPECTOMY  01/17/2016   Procedure: POLYPECTOMY;  Surgeon: [name], MD;  Location: [facility];  Service: Endoscopy;;  . RIGHT OOPHORECTOMY    . TEE WITHOUT CARDIOVERSION N/A 02/20/2017   Procedure: TRANSESOPHAGEAL ECHOCARDIOGRAM (TEE);  Surgeon: Grace Isaac, MD;  Location: [facility];  Service: Open Heart Surgery;  Laterality: N/A;    There were no vitals filed for this visit.  Subjective Assessment - 08/12/17 1823    Subjective patient reports monitoring BP at home and was 147/89 and denies chest pain, nausea or shortness of breath on arrival to clinic Patient reports she is feeling better in right hip: back pain continues  with standing, walking and bending activities    Pertinent History  long history of back pain with arthritis with episodes of exacerbation for at least the past 10 years. Previous therapy for back pain with good results for pain control. Recently had MI with CABG x 3 02/20/2017 and she is recuperating from this and has been more sedentary.     Limitations  Sitting;Lifting;Standing;Walking;House hold activities;Other (comment) personal care, getting in/out of car    How long can you sit comfortably?  < 20 min.    How long can you stand comfortably?  unable to tolerated standing at all in one position    How  long can you walk comfortably?  walking is painful and she has to exercise due to recent MI    Diagnostic tests  X rays:     Patient Stated Goals  to get back to do things in moderation with less pain, be able to walk, sit, drive comfortably    Currently in Pain?  Yes    Pain Score  5     Pain Orientation  Right;Left;Lower    Pain Descriptors / Indicators  Aching;Sore    Pain Onset  More than a month ago    Pain Frequency  Constant         Objective: Gait: antalgic, slow cadence, decreased weight shift to right LE; improved from previous session   Treatment: Therapeutic exercise: patient performed with guidance, assistance, demonstration and verbal cues of therapist: goal: improve ambulation with less pain, improve FOTO score, independent with home program, self management  standing side stepping on balance beam, using UE's for support x 2 min. With close supervision   sitting: knee extension each LE x 10 knee flexion with red resistive band x 15 each LE ankle DF with red resistive band x 10 ankle PF off balance stones x 10 hip adduction with ball and glute sets x 15 hip abduction with manual resistance isometrics x 10   Nustep x 5 min for LE exercise focus on leg press   BP post exercise 128/70 SPO2 96%; HR 63  Modalities: Electrical stimulation: 15 min High volt estim.clincial program for muscle spasms  (2) electrodes applied to lower back right and right hip anterior/posterior aspect intensity to tolerance with patient sitting in chair on airex pad with moist heat to back (no adverse reactions noted): goal: pain, spasms   Patient response to treatment: Patient demonstrated improved technique with exercises with minimal assistance and moderate cuing. improved pain level to 3/10 following estim. and moist heat. No adverse reactions noted with treatment.   PT Education - 08/12/17 1825    Education provided  Yes    Education Details  HEP     Person(s) Educated  Patient     Methods  Explanation    Comprehension  Verbalized understanding          PT Long Term Goals - 08/05/17 2027      PT LONG TERM GOAL #1   Title  improve FOTO to 30/100 demonstrating improved function with daily tasks    Baseline  FOTO 22/100    Status  New    Target Date  08/26/17      PT LONG TERM GOAL #2   Title  improve FOTO to 40/100 demonstrating improved function with daily tasks    Baseline  FOTO 22/100    Status  New    Target Date  09/16/17      PT LONG TERM GOAL #3  Title  patient will be independent with home program for pain control, exercises in order to transition to self management once discharged from physical therapy    Baseline  limited knowledge of appropriate pain control, exercise progression without guidance, cuing    Status  New    Target Date  09/16/17            Plan - 08/12/17 1919    Clinical Impression Statement  Patient able to perform exercises with assistance and moderate verbal cuing. she demonstrated increased pain, no worse following exercise. Decreased pain in lower back and right hip in general at end of session.     Rehab Potential  Fair    Clinical Impairments Affecting Rehab Potential  (+)motivated, acute on chronic pain, active lifestyle prior to this episode(-)chronic condition, pain, recent CABG x 3 02/20/2017, arthritis, HTN    PT Frequency  2x / week    PT Duration  6 weeks    PT Treatment/Interventions  Cryotherapy;Electrical Stimulation;Moist Heat;Ultrasound;Therapeutic exercise;Therapeutic activities;Neuromuscular re-education;Patient/family education;Manual techniques    PT Next Visit Plan  pain control, exercise progression as needed; electrical stimulation, moist heat    PT Home Exercise Plan  pain cotnrol, body mechanics, use of lumbar support for sitting, sleeping       Patient will benefit from skilled therapeutic intervention in order to improve the following deficits and impairments:  Pain, Improper body mechanics,  Increased muscle spasms, Decreased activity tolerance, Decreased endurance, Decreased range of motion, Decreased strength, Impaired perceived functional ability  Visit Diagnosis: Pain in right hip  Low back pain with sciatica, sciatica laterality unspecified, unspecified back pain laterality, unspecified chronicity  Muscle weakness (generalized)  Difficulty in walking, not elsewhere classified     Problem List Patient Active Problem List   Diagnosis Date Noted  . Abnormal nuclear stress test 05/24/2017  . 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 PAF (paroxysmal atrial fibrillation) ([place])   . Non-STEMI (non-ST elevated myocardial infarction) ([place]) 02/20/2017  . 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 Moderate mitral insufficiency 01/11/2017  . Personal history of colonic polyps   . Benign neoplasm of ascending colon   . Benign neoplasm of descending colon   . Chest pain, moderate coronary artery risk 09/16/2015  . Disequilibrium 09/16/2015  . Panic attacks 09/11/2015  . Ganglion cyst 08/16/2015  . Hypernatremia 07/04/2015  . Upper back pain 07/04/2015  . Other osteoarthritis involving multiple joints 02/06/2015  . Clinical depression 09/27/2014  . Dry mouth 09/27/2014  . Fibrositis 09/27/2014  . LBP (low back pain) 09/27/2014  . Avitaminosis D 09/27/2014  . Decreased leukocytes 09/27/2014  . Abnormal blood sugar 05/03/2009  . Insomnia 09/29/2007  . HTN (hypertension) 03/09/2007  . Adaptation reaction 01/29/2007  . Acid reflux 10/07/2006  . Menopausal and postmenopausal disorder 10/05/2006  . Headache, migraine 10/05/2006  . Arthritis, degenerative 03/23/1994  . Adult hypothyroidism 04/15/1993  . Hyperlipidemia LDL goal <70 03/25/1993    Jomarie Longs PT 08/12/2017, 7:22 PM  [facility] [address] [place], [address] Phone: [phone]   Fax:   [phone]  Name: Stacey [name] MRN: [id number] Date of Birth: 09-May-1941

## 2017-08-16 ENCOUNTER — Ambulatory Visit: Payer: Medicare Other | Admitting: Physical Therapy

## 2017-08-16 ENCOUNTER — Encounter: Payer: Self-pay | Admitting: Physical Therapy

## 2017-08-16 DIAGNOSIS — M5440 Lumbago with sciatica, unspecified side: Secondary | ICD-10-CM

## 2017-08-16 DIAGNOSIS — R262 Difficulty in walking, not elsewhere classified: Secondary | ICD-10-CM | POA: Diagnosis not present

## 2017-08-16 DIAGNOSIS — M25551 Pain in right hip: Secondary | ICD-10-CM | POA: Diagnosis not present

## 2017-08-16 DIAGNOSIS — M6281 Muscle weakness (generalized): Secondary | ICD-10-CM

## 2017-08-16 NOTE — Therapy (Signed)
[facility] [address] [place], [address] Phone: [phone]   Fax:  [phone]  Physical Therapy Treatment  Patient Details  Name: Stacey [name] MRN: [id number] Date of Birth: [birth year] Referring Provider: [name] PA-C   Encounter Date: 08/16/2017  PT End of Session - 08/16/17 1832    Visit Number  4    Number of Visits  12    Date for PT Re-Evaluation  09/16/17    Authorization Type  4 of 6 FOTO    PT Start Time  1820    PT Stop Time  1905    PT Time Calculation (min)  45 min    Activity Tolerance  Patient limited by pain;Patient tolerated treatment well    Behavior During Therapy  [facility] for tasks assessed/performed       Past Medical History:  Diagnosis Date  . Abnormal nuclear stress test 05/24/2017  . Arthritis   . Asthma   . 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 Cancer ([facility])   . Complication of anesthesia   . Depression   . Family history of adverse reaction to anesthesia    most of family - PONV  . Fibromyalgia   . Glaucoma   . Headache    migraines - 1-2x/mo  . HTN (hypertension) 03/09/2007  . Hyperlipidemia LDL goal <70 03/25/1993  . Hypertension   . Motion sickness    all moving vehicles  . PONV (postoperative nausea and vomiting)   . Thyroid disease     Past Surgical History:  Procedure Laterality Date  . ABDOMINAL HYSTERECTOMY    . APPENDECTOMY    . BLADDER SURGERY    . CATARACT EXTRACTION W/ INTRAOCULAR LENS  IMPLANT, BILATERAL    . COLONOSCOPY WITH PROPOFOL N/A 01/17/2016   Procedure: COLONOSCOPY WITH PROPOFOL;  Surgeon: [name], MD;  Location: [place];  Service: Endoscopy;  Laterality: N/A;  . CORONARY ARTERY BYPASS GRAFT N/A 02/20/2017   Procedure: CORONARY ARTERY BYPASS GRAFTING (CABG) x , three using left internal mammary artery to left anterior descending coronary artery and right greater  saphenous vein harvested endoscopically to distal right and diagonal coronary arteries.;  Surgeon: Grace Isaac, MD;  Location: [place];  Service: Open Heart Surgery;  Laterality: N/A;  . LEFT HEART CATH AND CORONARY ANGIOGRAPHY N/A 02/20/2017   Procedure: LEFT HEART CATH AND CORONARY ANGIOGRAPHY;  Surgeon: Isaias Cowman, MD;  Location: [place] [facility];  Service: Cardiovascular;  Laterality: N/A;  . LEFT HEART CATH AND CORS/GRAFTS ANGIOGRAPHY N/A 05/24/2017   Procedure: LEFT HEART CATH AND CORS/GRAFTS ANGIOGRAPHY;  Surgeon: Sherren Mocha, MD;  Location: [place] [facility];  Service: Cardiovascular;  Laterality: N/A;  . NASAL SINUS SURGERY    . POLYPECTOMY  01/17/2016   Procedure: POLYPECTOMY;  Surgeon: [name], MD;  Location: [place] [facility];  Service: Endoscopy;;  . RIGHT OOPHORECTOMY    . TEE WITHOUT CARDIOVERSION N/A 02/20/2017   Procedure: TRANSESOPHAGEAL ECHOCARDIOGRAM (TEE);  Surgeon: Grace Isaac, MD;  Location: [place];  Service: Open Heart Surgery;  Laterality: N/A;    There were no vitals filed for this visit.  Subjective Assessment - 08/16/17 1830    Subjective  Patient reports intermittent pain in right hip and back and is worse as the day goes on.     Pertinent History  long history of back pain with arthritis with episodes  of exacerbation for at least the past 10 years. Previous therapy for back pain with good results for pain control. Recently had MI with CABG x 3 02/20/2017 and she is recuperating from this and has been more sedentary.     Limitations  Sitting;Lifting;Standing;Walking;House hold activities;Other (comment) personal care, getting in/out of car    How long can you sit comfortably?  < 20 min.    How long can you stand comfortably?  unable to tolerated standing at all in one position    How long can you walk comfortably?  walking is painful and she has to exercise due to recent MI    Diagnostic tests  X rays:     Patient Stated Goals  to  get back to do things in moderation with less pain, be able to walk, sit, drive comfortably    Currently in Pain?  Yes    Pain Score  6     Pain Location  Back    Pain Orientation  Right;Lower    Pain Descriptors / Indicators  Aching;Sore    Pain Type  Chronic pain    Pain Onset  More than a month ago    Pain Frequency  Constant          Objective: Gait: antalgic, decreased weight shift to right LE; improved from previous session BP pre exercise 132/75    Treatment: Therapeutic exercise: patient performed with guidance, assistance, demonstration and verbal cues of therapist: goal: improve ambulation with less pain, improve FOTO score, independent with home program, self management   sitting: knee extension each LE x 10, tapping balance stones in front knee flexion with green resistive band x 15 each LE ankle DF with green resistive band x 15 ankle PF off balance stones x 10 hip abduction with green resistive band x 15   Nustep x 8 min for LE exercise focus on leg press    Modalities: Electrical stimulation: 15 min High volt estim.clincial program for muscle spasms  (2) electrodes applied to lower back right and (2) to right hip anterior/posterior aspect intensity to tolerance with patient sitting in chair on airex pad with moist heat to back (no adverse reactions noted): goal: pain, spasms   Patient response to treatment: improved endurance with exercises today. required assistance and demonstration to improve technique.     PT Education - 08/16/17 1832    Education provided  Yes    Education Details  exercise instruction for technique    Person(s) Educated  Patient    Methods  Explanation;Demonstration;Verbal cues    Comprehension  Verbal cues required;Returned demonstration;Verbalized understanding          PT Long Term Goals - 08/05/17 2027      PT LONG TERM GOAL #1   Title  improve FOTO to 30/100 demonstrating improved function with daily tasks    Baseline   FOTO 22/100    Status  New    Target Date  08/26/17      PT LONG TERM GOAL #2   Title  improve FOTO to 40/100 demonstrating improved function with daily tasks    Baseline  FOTO 22/100    Status  New    Target Date  09/16/17      PT LONG TERM GOAL #3   Title  patient will be independent with home program for pain control, exercises in order to transition to self management once discharged from physical therapy    Baseline  limited knowledge of appropriate pain  control, exercise progression without guidance, cuing    Status  New    Target Date  09/16/17            Plan - 08/16/17 2208    Clinical Impression Statement  Patient is progressing slowly with exercises and pain control due to chronic condition, recent cardiac surgery. She will benefit from continue progressive exercise and pain control to improve function and achieve goals.     Rehab Potential  Fair    Clinical Impairments Affecting Rehab Potential  (+)motivated, acute on chronic pain, active lifestyle prior to this episode(-)chronic condition, pain, recent CABG x 3 02/20/2017, arthritis, HTN    PT Frequency  2x / week    PT Duration  6 weeks    PT Treatment/Interventions  Cryotherapy;Electrical Stimulation;Moist Heat;Ultrasound;Therapeutic exercise;Therapeutic activities;Neuromuscular re-education;Patient/family education;Manual techniques    PT Next Visit Plan  pain control, exercise progression as needed; electrical stimulation, moist heat    PT Home Exercise Plan  pain cotnrol, body mechanics, use of lumbar support for sitting, sleeping       Patient will benefit from skilled therapeutic intervention in order to improve the following deficits and impairments:  Pain, Improper body mechanics, Increased muscle spasms, Decreased activity tolerance, Decreased endurance, Decreased range of motion, Decreased strength, Impaired perceived functional ability  Visit Diagnosis: Pain in right hip  Low back pain with sciatica,  sciatica laterality unspecified, unspecified back pain laterality, unspecified chronicity  Muscle weakness (generalized)  Difficulty in walking, not elsewhere classified     Problem List Patient Active Problem List   Diagnosis Date Noted  . Abnormal nuclear stress test 05/24/2017  . 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 PAF (paroxysmal atrial fibrillation) ([place])   . Non-STEMI (non-ST elevated myocardial infarction) ([place]) 02/20/2017  . 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 Moderate mitral insufficiency 01/11/2017  . Personal history of colonic polyps   . Benign neoplasm of ascending colon   . Benign neoplasm of descending colon   . Chest pain, moderate coronary artery risk 09/16/2015  . Disequilibrium 09/16/2015  . Panic attacks 09/11/2015  . Ganglion cyst 08/16/2015  . Hypernatremia 07/04/2015  . Upper back pain 07/04/2015  . Other osteoarthritis involving multiple joints 02/06/2015  . Clinical depression 09/27/2014  . Dry mouth 09/27/2014  . Fibrositis 09/27/2014  . LBP (low back pain) 09/27/2014  . Avitaminosis D 09/27/2014  . Decreased leukocytes 09/27/2014  . Abnormal blood sugar 05/03/2009  . Insomnia 09/29/2007  . HTN (hypertension) 03/09/2007  . Adaptation reaction 01/29/2007  . Acid reflux 10/07/2006  . Menopausal and postmenopausal disorder 10/05/2006  . Headache, migraine 10/05/2006  . Arthritis, degenerative 03/23/1994  . Adult hypothyroidism 04/15/1993  . Hyperlipidemia LDL goal <70 03/25/1993    Jomarie Longs PT 08/17/2017, 10:11 PM  [facility] [address], [place], [address] Phone: [phone]   Fax:  [phone]  Name: Stacey [name] MRN: [id number] Date of Birth: 05-07-1941

## 2017-08-18 ENCOUNTER — Telehealth: Payer: Self-pay | Admitting: Family Medicine

## 2017-08-18 ENCOUNTER — Encounter: Payer: Medicare Other | Admitting: Physical Therapy

## 2017-08-18 NOTE — Telephone Encounter (Signed)
Pt has arthritis in her lower back and the medication (tylenol) she is taking is not helping.  She is in Physical Therapy  Can something else be sent in for her  Their call back is [phone]  She uses [facility] [address]   Thanks teri

## 2017-08-18 NOTE — Telephone Encounter (Signed)
Please advise 

## 2017-08-19 ENCOUNTER — Ambulatory Visit: Payer: Medicare Other | Admitting: Physical Therapy

## 2017-08-23 ENCOUNTER — Ambulatory Visit: Payer: Medicare Other | Attending: Orthopedic Surgery | Admitting: Physical Therapy

## 2017-08-23 ENCOUNTER — Encounter: Payer: Self-pay | Admitting: Physical Therapy

## 2017-08-23 DIAGNOSIS — R262 Difficulty in walking, not elsewhere classified: Secondary | ICD-10-CM

## 2017-08-23 DIAGNOSIS — M6281 Muscle weakness (generalized): Secondary | ICD-10-CM

## 2017-08-23 DIAGNOSIS — M25551 Pain in right hip: Secondary | ICD-10-CM | POA: Diagnosis not present

## 2017-08-23 DIAGNOSIS — M5440 Lumbago with sciatica, unspecified side: Secondary | ICD-10-CM

## 2017-08-23 NOTE — Telephone Encounter (Signed)
Ortho treating.

## 2017-08-23 NOTE — Telephone Encounter (Signed)
Pt husband advised.

## 2017-08-23 NOTE — Therapy (Signed)
[facility] [address], [place], [address] Phone: [phone]   Fax:  [phone]  Physical Therapy Treatment  Patient Details  Name: Stacey [name] MRN: [id number] Date of Birth: [birth year] Referring Provider: [name] PA-C   Encounter Date: 08/23/2017  PT End of Session - 08/23/17 2332    Visit Number  5    Number of Visits  12    Date for PT Re-Evaluation  09/16/17    Authorization Type  5 of 6 FOTO    PT Start Time  1830    PT Stop Time  1900    PT Time Calculation (min)  30 min    Activity Tolerance  Patient limited by pain;Patient tolerated treatment well    Behavior During Therapy  [facility] for tasks assessed/performed       Past Medical History:  Diagnosis Date  . Abnormal nuclear stress test 05/24/2017  . Arthritis   . Asthma   . 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 Cancer ([place])   . Complication of anesthesia   . Depression   . Family history of adverse reaction to anesthesia    most of family - PONV  . Fibromyalgia   . Glaucoma   . Headache    migraines - 1-2x/mo  . HTN (hypertension) 03/09/2007  . Hyperlipidemia LDL goal <70 03/25/1993  . Hypertension   . Motion sickness    all moving vehicles  . PONV (postoperative nausea and vomiting)   . Thyroid disease     Past Surgical History:  Procedure Laterality Date  . ABDOMINAL HYSTERECTOMY    . APPENDECTOMY    . BLADDER SURGERY    . CATARACT EXTRACTION W/ INTRAOCULAR LENS  IMPLANT, BILATERAL    . COLONOSCOPY WITH PROPOFOL N/A 01/17/2016   Procedure: COLONOSCOPY WITH PROPOFOL;  Surgeon: [name], MD;  Location: [place];  Service: Endoscopy;  Laterality: N/A;  . CORONARY ARTERY BYPASS GRAFT N/A 02/20/2017   Procedure: CORONARY ARTERY BYPASS GRAFTING (CABG) x , three using left internal mammary artery to left anterior descending coronary artery and right greater saphenous  vein harvested endoscopically to distal right and diagonal coronary arteries.;  Surgeon: Grace Isaac, MD;  Location: [place];  Service: Open Heart Surgery;  Laterality: N/A;  . LEFT HEART CATH AND CORONARY ANGIOGRAPHY N/A 02/20/2017   Procedure: LEFT HEART CATH AND CORONARY ANGIOGRAPHY;  Surgeon: Isaias Cowman, MD;  Location: [place] [facility];  Service: Cardiovascular;  Laterality: N/A;  . LEFT HEART CATH AND CORS/GRAFTS ANGIOGRAPHY N/A 05/24/2017   Procedure: LEFT HEART CATH AND CORS/GRAFTS ANGIOGRAPHY;  Surgeon: Sherren Mocha, MD;  Location: [place] [facility];  Service: Cardiovascular;  Laterality: N/A;  . NASAL SINUS SURGERY    . POLYPECTOMY  01/17/2016   Procedure: POLYPECTOMY;  Surgeon: [name], MD;  Location: [place];  Service: Endoscopy;;  . RIGHT OOPHORECTOMY    . TEE WITHOUT CARDIOVERSION N/A 02/20/2017   Procedure: TRANSESOPHAGEAL ECHOCARDIOGRAM (TEE);  Surgeon: Grace Isaac, MD;  Location: [place];  Service: Open Heart Surgery;  Laterality: N/A;    There were no vitals filed for this visit.  Subjective Assessment - 08/23/17 1830    Subjective  Patient reports she continues with right hip and banck pain and is worse as the day goes on.    Pertinent History  long history of back pain with arthritis with episodes  of exacerbation for at least the past 10 years. Previous therapy for back pain with good results for pain control. Recently had MI with CABG x 3 02/20/2017 and she is recuperating from this and has been more sedentary.     Limitations  Sitting;Lifting;Standing;Walking;House hold activities;Other (comment) personal care, getting in/out of car    How long can you sit comfortably?  < 20 min.    How long can you stand comfortably?  unable to tolerated standing at all in one position    How long can you walk comfortably?  walking is painful and she has to exercise due to recent MI    Diagnostic tests  X rays:     Patient Stated Goals  to get back  to do things in moderation with less pain, be able to walk, sit, drive comfortably    Currently in Pain?  Yes    Pain Score  5     Pain Location  Back    Pain Orientation  Right;Lower    Pain Descriptors / Indicators  Aching;Sore    Pain Type  Chronic pain    Pain Onset  More than a month ago    Pain Frequency  Constant          Objective: Gait: antalgic, decreased weight shift to right LE; improved from previous session BP pre exercise 142/78    Treatment: Therapeutic exercise: patient performed with guidance, assistance, demonstration and verbal cues of therapist: goal: improve ambulation with less pain, improve FOTO score, independent with home program, self management     sitting: knee flexion with green resistive band x 15 each LE ankle DF with green resistive band x 15 ankle PF off balance stones x 10 hip abduction with green resistive band x 15  OMEGA Scapular retraction with 10# x 15 Standing straight arm pull downs 10# x 10 reps Palloff press pull to abdomen x 10 10#   Modalities: Electrical stimulation: 15 min High volt estim.clincial program for muscle spasms  (2) electrodes applied to lower back right and (2) to right hip anterior/posterior aspect intensity to tolerance with patient sitting in chair on airex pad with moist heat to back (no adverse reactions noted): goal: pain, spasms   Patient response to treatment: improved endurance with exercises today. required assistance and demonstration to improve technique.    PT Education - 08/23/17 1838    Education provided  Yes    Education Details  exercise instruction for [employer]) Educated  Patient    Methods  Explanation;Demonstration;Verbal cues    Comprehension  Verbalized understanding;Returned demonstration;Verbal cues required          PT Long Term Goals - 08/05/17 2027      PT LONG TERM GOAL #1   Title  improve FOTO to 30/100 demonstrating improved function with daily tasks     Baseline  FOTO 22/100    Status  New    Target Date  08/26/17      PT LONG TERM GOAL #2   Title  improve FOTO to 40/100 demonstrating improved function with daily tasks    Baseline  FOTO 22/100    Status  New    Target Date  09/16/17      PT LONG TERM GOAL #3   Title  patient will be independent with home program for pain control, exercises in order to transition to self management once discharged from physical therapy    Baseline  limited knowledge of appropriate  pain control, exercise progression without guidance, cuing    Status  New    Target Date  09/16/17            Plan - 08/23/17 1900    Clinical Impression Statement  Patient was able to perform exercises with improved endurance and technique with minimal cuing. She continues with pain that limits function with daily tasks and will require additional physical therapy intervention.     Rehab Potential  Fair    Clinical Impairments Affecting Rehab Potential  (+)motivated, acute on chronic pain, active lifestyle prior to this episode(-)chronic condition, pain, recent CABG x 3 02/20/2017, arthritis, HTN    PT Frequency  2x / week    PT Duration  6 weeks    PT Treatment/Interventions  Cryotherapy;Electrical Stimulation;Moist Heat;Ultrasound;Therapeutic exercise;Therapeutic activities;Neuromuscular re-education;Patient/family education;Manual techniques    PT Next Visit Plan  pain control, exercise progression as needed; electrical stimulation, moist heat    PT Home Exercise Plan  pain cotnrol, body mechanics, use of lumbar support for sitting, sleeping       Patient will benefit from skilled therapeutic intervention in order to improve the following deficits and impairments:  Pain, Improper body mechanics, Increased muscle spasms, Decreased activity tolerance, Decreased endurance, Decreased range of motion, Decreased strength, Impaired perceived functional ability  Visit Diagnosis: Pain in right hip  Low back pain with  sciatica, sciatica laterality unspecified, unspecified back pain laterality, unspecified chronicity  Muscle weakness (generalized)  Difficulty in walking, not elsewhere classified     Problem List Patient Active Problem List   Diagnosis Date Noted  . Abnormal nuclear stress test 05/24/2017  . 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 PAF (paroxysmal atrial fibrillation) ([place])   . Non-STEMI (non-ST elevated myocardial infarction) ([place]) 02/20/2017  . 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 Moderate mitral insufficiency 01/11/2017  . Personal history of colonic polyps   . Benign neoplasm of ascending colon   . Benign neoplasm of descending colon   . Chest pain, moderate coronary artery risk 09/16/2015  . Disequilibrium 09/16/2015  . Panic attacks 09/11/2015  . Ganglion cyst 08/16/2015  . Hypernatremia 07/04/2015  . Upper back pain 07/04/2015  . Other osteoarthritis involving multiple joints 02/06/2015  . Clinical depression 09/27/2014  . Dry mouth 09/27/2014  . Fibrositis 09/27/2014  . LBP (low back pain) 09/27/2014  . Avitaminosis D 09/27/2014  . Decreased leukocytes 09/27/2014  . Abnormal blood sugar 05/03/2009  . Insomnia 09/29/2007  . HTN (hypertension) 03/09/2007  . Adaptation reaction 01/29/2007  . Acid reflux 10/07/2006  . Menopausal and postmenopausal disorder 10/05/2006  . Headache, migraine 10/05/2006  . Arthritis, degenerative 03/23/1994  . Adult hypothyroidism 04/15/1993  . Hyperlipidemia LDL goal <70 03/25/1993    Jomarie Longs PT 08/24/2017, 10:13 PM  [facility] [address] [place], [address] Phone: [phone]   Fax:  [phone]  Name: Stacey [name] MRN: [id number] Date of Birth: [birth year]

## 2017-08-25 ENCOUNTER — Ambulatory Visit: Payer: Medicare Other | Admitting: Physical Therapy

## 2017-08-26 ENCOUNTER — Encounter: Payer: Self-pay | Admitting: Physical Therapy

## 2017-08-26 ENCOUNTER — Ambulatory Visit: Payer: Medicare Other | Admitting: Physical Therapy

## 2017-08-26 DIAGNOSIS — M5440 Lumbago with sciatica, unspecified side: Secondary | ICD-10-CM

## 2017-08-26 DIAGNOSIS — M6281 Muscle weakness (generalized): Secondary | ICD-10-CM

## 2017-08-26 DIAGNOSIS — M25551 Pain in right hip: Secondary | ICD-10-CM

## 2017-08-26 DIAGNOSIS — R262 Difficulty in walking, not elsewhere classified: Secondary | ICD-10-CM | POA: Diagnosis not present

## 2017-08-26 NOTE — Therapy (Signed)
[facility] [address] [place], [address] Phone: [phone]   Fax:  [phone]  Physical Therapy Treatment  Patient Details  Name: Stacey [name] MRN: [id number] Date of Birth: 09/18/1940 Referring Provider: [name] PA-C   Encounter Date: 08/26/2017  PT End of Session - 08/26/17 2009    Visit Number  6    Number of Visits  12    Date for PT Re-Evaluation  09/16/17    Authorization Type  6 of 6 FOTO    PT Start Time  1825    PT Stop Time  1915    PT Time Calculation (min)  50 min    Activity Tolerance  Patient limited by pain;Patient tolerated treatment well    Behavior During Therapy  [facility] for tasks assessed/performed       Past Medical History:  Diagnosis Date  . Abnormal nuclear stress test 05/24/2017  . Arthritis   . Asthma   . 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 Cancer ([place])   . Complication of anesthesia   . Depression   . Family history of adverse reaction to anesthesia    most of family - PONV  . Fibromyalgia   . Glaucoma   . Headache    migraines - 1-2x/mo  . HTN (hypertension) 03/09/2007  . Hyperlipidemia LDL goal <70 03/25/1993  . Hypertension   . Motion sickness    all moving vehicles  . PONV (postoperative nausea and vomiting)   . Thyroid disease     Past Surgical History:  Procedure Laterality Date  . ABDOMINAL HYSTERECTOMY    . APPENDECTOMY    . BLADDER SURGERY    . CATARACT EXTRACTION W/ INTRAOCULAR LENS  IMPLANT, BILATERAL    . COLONOSCOPY WITH PROPOFOL N/A 01/17/2016   Procedure: COLONOSCOPY WITH PROPOFOL;  Surgeon: [name], MD;  Location: [place];  Service: Endoscopy;  Laterality: N/A;  . CORONARY ARTERY BYPASS GRAFT N/A 02/20/2017   Procedure: CORONARY ARTERY BYPASS GRAFTING (CABG) x , three using left internal mammary artery to left anterior descending coronary artery and right greater saphenous  vein harvested endoscopically to distal right and diagonal coronary arteries.;  Surgeon: Grace Isaac, MD;  Location: [place];  Service: Open Heart Surgery;  Laterality: N/A;  . LEFT HEART CATH AND CORONARY ANGIOGRAPHY N/A 02/20/2017   Procedure: LEFT HEART CATH AND CORONARY ANGIOGRAPHY;  Surgeon: Isaias Cowman, MD;  Location: [place] [facility];  Service: Cardiovascular;  Laterality: N/A;  . LEFT HEART CATH AND CORS/GRAFTS ANGIOGRAPHY N/A 05/24/2017   Procedure: LEFT HEART CATH AND CORS/GRAFTS ANGIOGRAPHY;  Surgeon: Sherren Mocha, MD;  Location: [place] [facility];  Service: Cardiovascular;  Laterality: N/A;  . NASAL SINUS SURGERY    . POLYPECTOMY  01/17/2016   Procedure: POLYPECTOMY;  Surgeon: [name], MD;  Location: [place];  Service: Endoscopy;;  . RIGHT OOPHORECTOMY    . TEE WITHOUT CARDIOVERSION N/A 02/20/2017   Procedure: TRANSESOPHAGEAL ECHOCARDIOGRAM (TEE);  Surgeon: Grace Isaac, MD;  Location: [place];  Service: Open Heart Surgery;  Laterality: N/A;    There were no vitals filed for this visit.  Subjective Assessment - 08/26/17 1823    Subjective  Patient reports she continues with right hip and back pain that increases with activity and standing, walking    Pertinent History  long history of back pain with arthritis with episodes of  exacerbation for at least the past 10 years. Previous therapy for back pain with good results for pain control. Recently had MI with CABG x 3 02/20/2017 and she is recuperating from this and has been more sedentary.     Limitations  Sitting;Lifting;Standing;Walking;House hold activities;Other (comment) personal care, getting in/out of car    How long can you sit comfortably?  < 20 min.    How long can you stand comfortably?  unable to tolerated standing at all in one position    How long can you walk comfortably?  walking is painful and she has to exercise due to recent MI    Diagnostic tests  X rays:     Patient Stated  Goals  to get back to do things in moderation with less pain, be able to walk, sit, drive comfortably    Currently in Pain?  Yes    Pain Score  5     Pain Location  Back    Pain Orientation  Right;Lower    Pain Descriptors / Indicators  Aching;Sore    Pain Type  Chronic pain    Pain Onset  More than a month ago    Pain Frequency  Constant       Objective: Gait: antalgic, decreased weight shift to right LE; improved from previous session   Treatment: Therapeutic exercise: patient performed with guidance, assistance, demonstration and verbal cues of therapist: goal: improve ambulation with less pain, improve FOTO score, independent with home program, self management   sitting: knee flexion with red resistive band 2 x 15 each LE ankle DF with red resistive band x 15 ankle PF off balance stones x 10 hip abduction with red resistive band x 15 with isometric hold end range   OMEGA Scapular retraction with 10# x 15 Standing straight arm pull downs 10# x 10 reps Palloff press pull to abdomen x 10 10#   Modalities: US/estim combination x 8 min: 1MHz /continuous 1.2w/cm2/highvolt 110 volts : patient seated on treatment table: performed treatment to right gluteus medius region  Electrical stimulation: 20 min High volt estim.clincial program for muscle spasms  (2) electrodes applied to lower back right and (2) to right hip anterior/posterior aspect intensity to tolerance with patient sitting in chair on airex pad with moist heat to back (no adverse reactions noted): goal: pain, spasms   Patient response to treatment: decreased tenderness and pain 50% right lower back, hip/gluteus medius muscle following modalities. improved endurance with exercises. Improved technique with exercises with moderate cuing and demonstration.      PT Education - 08/26/17 1900    Education provided  Yes    Education Details  exercise instruction; educated in US/estim combination for muscle spasms    Person(s)  Educated  Patient    Methods  Explanation;Demonstration;Verbal cues    Comprehension  Verbalized understanding;Returned demonstration;Verbal cues required          PT Long Term Goals - 08/05/17 2027      PT LONG TERM GOAL #1   Title  improve FOTO to 30/100 demonstrating improved function with daily tasks    Baseline  FOTO 22/100    Status  New    Target Date  08/26/17      PT LONG TERM GOAL #2   Title  improve FOTO to 40/100 demonstrating improved function with daily tasks    Baseline  FOTO 22/100    Status  New    Target Date  09/16/17  PT LONG TERM GOAL #3   Title  patient will be independent with home program for pain control, exercises in order to transition to self management once discharged from physical therapy    Baseline  limited knowledge of appropriate pain control, exercise progression without guidance, cuing    Status  New    Target Date  09/16/17            Plan - 08/26/17 1831    Clinical Impression Statement  Patient continues with pain in back and right hip with aggravating activities and is progressing with exercises with assistance, cuing. She continues with decreased strength, increased pain in back and right hip and will require additional physical therapy intervention to achieve goals.     Rehab Potential  Fair    Clinical Impairments Affecting Rehab Potential  (+)motivated, acute on chronic pain, active lifestyle prior to this episode(-)chronic condition, pain, recent CABG x 3 02/20/2017, arthritis, HTN    PT Frequency  2x / week    PT Duration  6 weeks    PT Treatment/Interventions  Cryotherapy;Electrical Stimulation;Moist Heat;Ultrasound;Therapeutic exercise;Therapeutic activities;Neuromuscular re-education;Patient/family education;Manual techniques    PT Next Visit Plan  pain control, exercise progression as needed; electrical stimulation, moist heat    PT Home Exercise Plan  pain cotnrol, body mechanics, use of lumbar support for sitting,  sleeping       Patient will benefit from skilled therapeutic intervention in order to improve the following deficits and impairments:  Pain, Improper body mechanics, Increased muscle spasms, Decreased activity tolerance, Decreased endurance, Decreased range of motion, Decreased strength, Impaired perceived functional ability  Visit Diagnosis: Pain in right hip  Low back pain with sciatica, sciatica laterality unspecified, unspecified back pain laterality, unspecified chronicity  Muscle weakness (generalized)  Difficulty in walking, not elsewhere classified     Problem List Patient Active Problem List   Diagnosis Date Noted  . Abnormal nuclear stress test 05/24/2017  . 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 PAF (paroxysmal atrial fibrillation) ([place])   . Non-STEMI (non-ST elevated myocardial infarction) ([place]) 02/20/2017  . 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 Moderate mitral insufficiency 01/11/2017  . Personal history of colonic polyps   . Benign neoplasm of ascending colon   . Benign neoplasm of descending colon   . Chest pain, moderate coronary artery risk 09/16/2015  . Disequilibrium 09/16/2015  . Panic attacks 09/11/2015  . Ganglion cyst 08/16/2015  . Hypernatremia 07/04/2015  . Upper back pain 07/04/2015  . Other osteoarthritis involving multiple joints 02/06/2015  . Clinical depression 09/27/2014  . Dry mouth 09/27/2014  . Fibrositis 09/27/2014  . LBP (low back pain) 09/27/2014  . Avitaminosis D 09/27/2014  . Decreased leukocytes 09/27/2014  . Abnormal blood sugar 05/03/2009  . Insomnia 09/29/2007  . HTN (hypertension) 03/09/2007  . Adaptation reaction 01/29/2007  . Acid reflux 10/07/2006  . Menopausal and postmenopausal disorder 10/05/2006  . Headache, migraine 10/05/2006  . Arthritis, degenerative 03/23/1994  . Adult hypothyroidism 04/15/1993  . Hyperlipidemia LDL goal <70 03/25/1993    Jomarie Longs PT 08/27/2017, 1:37  PM  [facility] [address], [place], [address] Phone: [phone]   Fax:  [phone]  Name: Stacey [name] MRN: [id number] Date of Birth: March 22, 1941

## 2017-08-30 ENCOUNTER — Encounter: Payer: Self-pay | Admitting: Physical Therapy

## 2017-08-30 ENCOUNTER — Other Ambulatory Visit: Payer: Self-pay | Admitting: Family Medicine

## 2017-08-30 ENCOUNTER — Ambulatory Visit: Payer: Medicare Other | Admitting: Physical Therapy

## 2017-08-30 ENCOUNTER — Other Ambulatory Visit: Payer: Self-pay | Admitting: Physician Assistant

## 2017-08-30 DIAGNOSIS — I10 Essential (primary) hypertension: Secondary | ICD-10-CM | POA: Diagnosis not present

## 2017-08-30 DIAGNOSIS — M6281 Muscle weakness (generalized): Secondary | ICD-10-CM

## 2017-08-30 DIAGNOSIS — M25551 Pain in right hip: Secondary | ICD-10-CM | POA: Diagnosis not present

## 2017-08-30 DIAGNOSIS — R262 Difficulty in walking, not elsewhere classified: Secondary | ICD-10-CM | POA: Diagnosis not present

## 2017-08-30 DIAGNOSIS — M5440 Lumbago with sciatica, unspecified side: Secondary | ICD-10-CM

## 2017-08-30 DIAGNOSIS — I25810 Atherosclerosis of coronary artery bypass graft(s) without angina pectoris: Secondary | ICD-10-CM | POA: Diagnosis not present

## 2017-08-30 DIAGNOSIS — E782 Mixed hyperlipidemia: Secondary | ICD-10-CM | POA: Diagnosis not present

## 2017-08-30 DIAGNOSIS — F3342 Major depressive disorder, recurrent, in full remission: Secondary | ICD-10-CM

## 2017-08-30 MED ORDER — DIAZEPAM 5 MG PO TABS: 5.0000 mg | ORAL_TABLET | Freq: Two times a day (BID) | ORAL | 1 refills | Status: DC | PRN

## 2017-08-30 NOTE — Therapy (Signed)
[name] [address] [place], [address] Phone: [phone]   Fax:  [phone]  Physical Therapy Treatment  Patient Details  Name: Stacey [name] MRN: [id number] Date of Birth: 1940/11/21 Referring Provider: [name] PA-C   Encounter Date: 08/30/2017  PT End of Session - 08/30/17 1822    Visit Number  7    Number of Visits  12    Date for PT Re-Evaluation  09/16/17    Authorization Type  7 of 6 FOTO    PT Start Time  1815    PT Stop Time  1913    PT Time Calculation (min)  58 min    Activity Tolerance  Patient tolerated treatment well    Behavior During Therapy  [facility] for tasks assessed/performed       Past Medical History:  Diagnosis Date  . Abnormal nuclear stress test 05/24/2017  . Arthritis   . Asthma   . 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 Cancer ([place])   . Complication of anesthesia   . Depression   . Family history of adverse reaction to anesthesia    most of family - PONV  . Fibromyalgia   . Glaucoma   . Headache    migraines - 1-2x/mo  . HTN (hypertension) 03/09/2007  . Hyperlipidemia LDL goal <70 03/25/1993  . Hypertension   . Motion sickness    all moving vehicles  . PONV (postoperative nausea and vomiting)   . Thyroid disease     Past Surgical History:  Procedure Laterality Date  . ABDOMINAL HYSTERECTOMY    . APPENDECTOMY    . BLADDER SURGERY    . CATARACT EXTRACTION W/ INTRAOCULAR LENS  IMPLANT, BILATERAL    . COLONOSCOPY WITH PROPOFOL N/A 01/17/2016   Procedure: COLONOSCOPY WITH PROPOFOL;  Surgeon: [name], MD;  Location: [place];  Service: Endoscopy;  Laterality: N/A;  . CORONARY ARTERY BYPASS GRAFT N/A 02/20/2017   Procedure: CORONARY ARTERY BYPASS GRAFTING (CABG) x , three using left internal mammary artery to left anterior descending coronary artery and right greater saphenous vein harvested  endoscopically to distal right and diagonal coronary arteries.;  Surgeon: Grace Isaac, MD;  Location: [place];  Service: Open Heart Surgery;  Laterality: N/A;  . LEFT HEART CATH AND CORONARY ANGIOGRAPHY N/A 02/20/2017   Procedure: LEFT HEART CATH AND CORONARY ANGIOGRAPHY;  Surgeon: Isaias Cowman, MD;  Location: [place] [facility];  Service: Cardiovascular;  Laterality: N/A;  . LEFT HEART CATH AND CORS/GRAFTS ANGIOGRAPHY N/A 05/24/2017   Procedure: LEFT HEART CATH AND CORS/GRAFTS ANGIOGRAPHY;  Surgeon: Sherren Mocha, MD;  Location: [place] [facility];  Service: Cardiovascular;  Laterality: N/A;  . NASAL SINUS SURGERY    . POLYPECTOMY  01/17/2016   Procedure: POLYPECTOMY;  Surgeon: [name], MD;  Location: [place];  Service: Endoscopy;;  . RIGHT OOPHORECTOMY    . TEE WITHOUT CARDIOVERSION N/A 02/20/2017   Procedure: TRANSESOPHAGEAL ECHOCARDIOGRAM (TEE);  Surgeon: Grace Isaac, MD;  Location: [place];  Service: Open Heart Surgery;  Laterality: N/A;    There were no vitals filed for this visit.  Subjective Assessment - 08/30/17 1818    Subjective  Patient reports being sore all over and feels she is having aches due to cholesterol medication    Pertinent History  long history of back pain with arthritis with episodes of exacerbation for at least  the past 10 years. Previous therapy for back pain with good results for pain control. Recently had MI with CABG x 3 02/20/2017 and she is recuperating from this and has been more sedentary.     Limitations  Sitting;Lifting;Standing;Walking;House hold activities;Other (comment) personal care, getting in/out of car    How long can you sit comfortably?  < 20 min.    How long can you stand comfortably?  unable to tolerated standing at all in one position    How long can you walk comfortably?  walking is painful and she has to exercise due to recent MI    Diagnostic tests  X rays:     Patient Stated Goals  to get back to do things  in moderation with less pain, be able to walk, sit, drive comfortably    Currently in Pain?  Yes    Pain Score  7     Pain Location  Back    Pain Orientation  Right;Lower    Pain Descriptors / Indicators  Aching;Sore    Pain Type  Chronic pain    Pain Onset  More than a month ago    Pain Frequency  Constant          Objective: Gait: mild antalgic pattern, decreased weight shift to right LE   Treatment: Therapeutic exercise: patient performed with guidance, assistance, demonstration and verbal cues of therapist: goal: improve ambulation with less pain, improve FOTO score, independent with home program, self management   sitting: knee flexion with red resistive band 2 x 15 each LE ankle DF with red resistive band x 15 ankle PF off balance stones x 10 hip abduction with red resistive band x 15 with isometric hold end range   OMEGA Scapular retraction with 10# x 15 Standing straight arm pull downs 10# x 10 reps Palloff press pull to abdomen x 10 10#   Modalities: US/estim combination x 8 min: 1MHz /continuous 1.2w/cm2/highvolt 110 volts : patient seated on treatment table: performed treatment to right gluteus medius region   Electrical stimulation: 20 min High volt estim.clincial program for muscle spasms  (4) electrodes applied to lower back right and left paraspinal muscles intensity to tolerance with patient sitting in chair on airex pad with moist heat to back (no adverse reactions noted): goal: pain, spasms   Patient response to treatment: improved soft tissue elasticity, decreased tenderness posterior right hip/gluteus medius region. Improved exercise technique with minimal cuing. No adverse effects with moist heat/estim.   PT Long Term Goals - 08/05/17 2027      PT LONG TERM GOAL #1   Title  improve FOTO to 30/100 demonstrating improved function with daily tasks    Baseline  FOTO 22/100    Status  New    Target Date  08/26/17      PT LONG TERM GOAL #2   Title   improve FOTO to 40/100 demonstrating improved function with daily tasks    Baseline  FOTO 22/100    Status  New    Target Date  09/16/17      PT LONG TERM GOAL #3   Title  patient will be independent with home program for pain control, exercises in order to transition to self management once discharged from physical therapy    Baseline  limited knowledge of appropriate pain control, exercise progression without guidance, cuing    Status  New    Target Date  09/16/17  Plan - 08/30/17 1900    Clinical Impression Statement  Patient demonstrates improvement with spasms and right lower back pain. She requires minimal cuing and demonstration to perform exercises with good technique.     Rehab Potential  Fair    Clinical Impairments Affecting Rehab Potential  (+)motivated, acute on chronic pain, active lifestyle prior to this episode(-)chronic condition, pain, recent CABG x 3 02/20/2017, arthritis, HTN    PT Frequency  2x / week    PT Duration  6 weeks    PT Treatment/Interventions  Cryotherapy;Electrical Stimulation;Moist Heat;Ultrasound;Therapeutic exercise;Therapeutic activities;Neuromuscular re-education;Patient/family education;Manual techniques    PT Next Visit Plan  pain control, exercise progression as needed; electrical stimulation, moist heat    PT Home Exercise Plan  pain cotnrol, body mechanics, use of lumbar support for sitting, sleeping       Patient will benefit from skilled therapeutic intervention in order to improve the following deficits and impairments:  Pain, Improper body mechanics, Increased muscle spasms, Decreased activity tolerance, Decreased endurance, Decreased range of motion, Decreased strength, Impaired perceived functional ability  Visit Diagnosis: Pain in right hip  Low back pain with sciatica, sciatica laterality unspecified, unspecified back pain laterality, unspecified chronicity  Muscle weakness (generalized)  Difficulty in walking, not  elsewhere classified     Problem List Patient Active Problem List   Diagnosis Date Noted  . Abnormal nuclear stress test 05/24/2017  . 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 PAF (paroxysmal atrial fibrillation) ([place])   . Non-STEMI (non-ST elevated myocardial infarction) ([place]) 02/20/2017  . 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 Moderate mitral insufficiency 01/11/2017  . Personal history of colonic polyps   . Benign neoplasm of ascending colon   . Benign neoplasm of descending colon   . Chest pain, moderate coronary artery risk 09/16/2015  . Disequilibrium 09/16/2015  . Panic attacks 09/11/2015  . Ganglion cyst 08/16/2015  . Hypernatremia 07/04/2015  . Upper back pain 07/04/2015  . Other osteoarthritis involving multiple joints 02/06/2015  . Clinical depression 09/27/2014  . Dry mouth 09/27/2014  . Fibrositis 09/27/2014  . LBP (low back pain) 09/27/2014  . Avitaminosis D 09/27/2014  . Decreased leukocytes 09/27/2014  . Abnormal blood sugar 05/03/2009  . Insomnia 09/29/2007  . HTN (hypertension) 03/09/2007  . Adaptation reaction 01/29/2007  . Acid reflux 10/07/2006  . Menopausal and postmenopausal disorder 10/05/2006  . Headache, migraine 10/05/2006  . Arthritis, degenerative 03/23/1994  . Adult hypothyroidism 04/15/1993  . Hyperlipidemia LDL goal <70 03/25/1993    Jomarie Longs PT 08/31/2017, 10:01 PM  [facility] [address], [place], [address] Phone: [phone]   Fax:  [phone]  Name: SHARELLE BURDITT MRN: [id number] Date of Birth: Jan 12, 1941

## 2017-09-01 ENCOUNTER — Ambulatory Visit: Payer: Medicare Other | Admitting: Physical Therapy

## 2017-09-01 ENCOUNTER — Encounter: Payer: Self-pay | Admitting: Physical Therapy

## 2017-09-01 DIAGNOSIS — M6281 Muscle weakness (generalized): Secondary | ICD-10-CM | POA: Diagnosis not present

## 2017-09-01 DIAGNOSIS — M25551 Pain in right hip: Secondary | ICD-10-CM

## 2017-09-01 DIAGNOSIS — R262 Difficulty in walking, not elsewhere classified: Secondary | ICD-10-CM | POA: Diagnosis not present

## 2017-09-01 DIAGNOSIS — M5440 Lumbago with sciatica, unspecified side: Secondary | ICD-10-CM

## 2017-09-01 NOTE — Therapy (Signed)
[facility] [address] [place], [address] Phone: [phone]   Fax:  [phone]  Physical Therapy Treatment  Patient Details  Name: Stacey [name] MRN: [id number] Date of Birth: 1940/11/17 Referring Provider: [name] PA-C   Encounter Date: 09/01/2017  PT End of Session - 09/01/17 2220    Visit Number  8    Number of Visits  12    Date for PT Re-Evaluation  09/16/17    Authorization Type  8 of 6 FOTO    PT Start Time  1821    PT Stop Time  1905    PT Time Calculation (min)  44 min    Activity Tolerance  Patient tolerated treatment well    Behavior During Therapy  [facility] for tasks assessed/performed       Past Medical History:  Diagnosis Date  . Abnormal nuclear stress test 05/24/2017  . Arthritis   . Asthma   . 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 Cancer ([place])   . Complication of anesthesia   . Depression   . Family history of adverse reaction to anesthesia    most of family - PONV  . Fibromyalgia   . Glaucoma   . Headache    migraines - 1-2x/mo  . HTN (hypertension) 03/09/2007  . Hyperlipidemia LDL goal <70 03/25/1993  . Hypertension   . Motion sickness    all moving vehicles  . PONV (postoperative nausea and vomiting)   . Thyroid disease     Past Surgical History:  Procedure Laterality Date  . ABDOMINAL HYSTERECTOMY    . APPENDECTOMY    . BLADDER SURGERY    . CATARACT EXTRACTION W/ INTRAOCULAR LENS  IMPLANT, BILATERAL    . COLONOSCOPY WITH PROPOFOL N/A 01/17/2016   Procedure: COLONOSCOPY WITH PROPOFOL;  Surgeon: [name], MD;  Location: [place];  Service: Endoscopy;  Laterality: N/A;  . CORONARY ARTERY BYPASS GRAFT N/A 02/20/2017   Procedure: CORONARY ARTERY BYPASS GRAFTING (CABG) x , three using left internal mammary artery to left anterior descending coronary artery and right greater saphenous vein harvested  endoscopically to distal right and diagonal coronary arteries.;  Surgeon: Grace Isaac, MD;  Location: [place];  Service: Open Heart Surgery;  Laterality: N/A;  . LEFT HEART CATH AND CORONARY ANGIOGRAPHY N/A 02/20/2017   Procedure: LEFT HEART CATH AND CORONARY ANGIOGRAPHY;  Surgeon: Isaias Cowman, MD;  Location: [place] [facility];  Service: Cardiovascular;  Laterality: N/A;  . LEFT HEART CATH AND CORS/GRAFTS ANGIOGRAPHY N/A 05/24/2017   Procedure: LEFT HEART CATH AND CORS/GRAFTS ANGIOGRAPHY;  Surgeon: Sherren Mocha, MD;  Location: [place] [facility];  Service: Cardiovascular;  Laterality: N/A;  . NASAL SINUS SURGERY    . POLYPECTOMY  01/17/2016   Procedure: POLYPECTOMY;  Surgeon: [name], MD;  Location: [facility];  Service: Endoscopy;;  . RIGHT OOPHORECTOMY    . TEE WITHOUT CARDIOVERSION N/A 02/20/2017   Procedure: TRANSESOPHAGEAL ECHOCARDIOGRAM (TEE);  Surgeon: Grace Isaac, MD;  Location: [place];  Service: Open Heart Surgery;  Laterality: N/A;    There were no vitals filed for this visit.  Subjective Assessment - 09/01/17 1830    Subjective  Patient reports chief concern of right lower back pain and hip pain.     Pertinent History  long history of back pain with arthritis with episodes of exacerbation for at least the past 10  years. Previous therapy for back pain with good results for pain control. Recently had MI with CABG x 3 02/20/2017 and she is recuperating from this and has been more sedentary.     Limitations  Sitting;Lifting;Standing;Walking;House hold activities;Other (comment) personal care, getting in/out of car    How long can you sit comfortably?  < 20 min.    How long can you stand comfortably?  unable to tolerated standing at all in one position    How long can you walk comfortably?  walking is painful and she has to exercise due to recent MI    Diagnostic tests  X rays:     Patient Stated Goals  to get back to do things in moderation with less  pain, be able to walk, sit, drive comfortably    Currently in Pain?  Yes    Pain Score  5     Pain Location  Back    Pain Orientation  Right;Lower    Pain Descriptors / Indicators  Aching;Sore    Pain Type  Chronic pain    Pain Onset  More than a month ago    Pain Frequency  Constant           Objective: Gait: mild antalgic pattern, decreased weight shift to right LE   Treatment: Manual therapy: 15 min; goal; improve pain, soft tissue elasticity Patient seated in massage chair: STM performed with superficial and deep techniques to thoracic to lumbar spine   Therapeutic exercise: patient performed with guidance, assistance, demonstration and verbal cues of therapist: goal: improve ambulation with less pain, improve FOTO score, independent with home program, self management sitting: hip abduction with red resistive band x 15 with isometric hold end range Green resistive band scapular retraction bilateral x 10 Palloff press forward and perpendicular x 10 each green resistive band Straight arm pull down green resistive band x 10    Modalities: US/estim combination x 10 min: 1MHz /continuous 1.2w/cm2/highvolt 105 volts : patient seated on treatment table: performed treatment to right gluteus medius region Moist heat to lower back with patient seated (in conjunction with exercises) goal: pain, spasms; no adverse reactions noted  Patient response to treatment: improved soft tissue elasticity, decreased tenderness posterior right hip/gluteus medius region. Improved exercise technique with minimal cuing.       PT Education - 09/01/17 1900    Education provided  Yes    Education Details  HEP    Person(s) Educated  Patient    Methods  Explanation;Demonstration;Verbal cues;Handout    Comprehension  Verbal cues required;Returned demonstration;Verbalized understanding          PT Long Term Goals - 08/05/17 2027      PT LONG TERM GOAL #1   Title  improve FOTO to 30/100  demonstrating improved function with daily tasks    Baseline  FOTO 22/100    Status  New    Target Date  08/26/17      PT LONG TERM GOAL #2   Title  improve FOTO to 40/100 demonstrating improved function with daily tasks    Baseline  FOTO 22/100    Status  New    Target Date  09/16/17      PT LONG TERM GOAL #3   Title  patient will be independent with home program for pain control, exercises in order to transition to self management once discharged from physical therapy    Baseline  limited knowledge of appropriate pain control, exercise progression without guidance, cuing  Status  New    Target Date  09/16/17            Plan - 09/01/17 1920    Clinical Impression Statement  Patient demonstrated decreased spasms and able to transfer from sit to stand from chair at end of session with less difficulty and pain per patient report.    Rehab Potential  Fair    Clinical Impairments Affecting Rehab Potential  (+)motivated, acute on chronic pain, active lifestyle prior to this episode(-)chronic condition, pain, recent CABG x 3 02/20/2017, arthritis, HTN    PT Frequency  2x / week    PT Duration  6 weeks    PT Treatment/Interventions  Cryotherapy;Electrical Stimulation;Moist Heat;Ultrasound;Therapeutic exercise;Therapeutic activities;Neuromuscular re-education;Patient/family education;Manual techniques    PT Next Visit Plan  pain control, exercise progression as needed; electrical stimulation, moist heat    PT Home Exercise Plan  pain cotnrol, body mechanics, use of lumbar support for sitting, sleeping       Patient will benefit from skilled therapeutic intervention in order to improve the following deficits and impairments:  Pain, Improper body mechanics, Increased muscle spasms, Decreased activity tolerance, Decreased endurance, Decreased range of motion, Decreased strength, Impaired perceived functional ability  Visit Diagnosis: Pain in right hip  Low back pain with sciatica,  sciatica laterality unspecified, unspecified back pain laterality, unspecified chronicity  Muscle weakness (generalized)  Difficulty in walking, not elsewhere classified     Problem List Patient Active Problem List   Diagnosis Date Noted  . Abnormal nuclear stress test 05/24/2017  . 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 PAF (paroxysmal atrial fibrillation) ([place])   . Non-STEMI (non-ST elevated myocardial infarction) ([place]) 02/20/2017  . 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 Moderate mitral insufficiency 01/11/2017  . Personal history of colonic polyps   . Benign neoplasm of ascending colon   . Benign neoplasm of descending colon   . Chest pain, moderate coronary artery risk 09/16/2015  . Disequilibrium 09/16/2015  . Panic attacks 09/11/2015  . Ganglion cyst 08/16/2015  . Hypernatremia 07/04/2015  . Upper back pain 07/04/2015  . Other osteoarthritis involving multiple joints 02/06/2015  . Clinical depression 09/27/2014  . Dry mouth 09/27/2014  . Fibrositis 09/27/2014  . LBP (low back pain) 09/27/2014  . Avitaminosis D 09/27/2014  . Decreased leukocytes 09/27/2014  . Abnormal blood sugar 05/03/2009  . Insomnia 09/29/2007  . HTN (hypertension) 03/09/2007  . Adaptation reaction 01/29/2007  . Acid reflux 10/07/2006  . Menopausal and postmenopausal disorder 10/05/2006  . Headache, migraine 10/05/2006  . Arthritis, degenerative 03/23/1994  . Adult hypothyroidism 04/15/1993  . Hyperlipidemia LDL goal <70 03/25/1993    Jomarie Longs PT 09/01/2017, 10:27 PM  [facility] [address], [place], [address] Phone: [phone]   Fax:  [phone]  Name: Stacey [name] MRN: [id number] Date of Birth: Apr 08, 1941

## 2017-09-06 ENCOUNTER — Encounter: Payer: Self-pay | Admitting: Physical Therapy

## 2017-09-06 ENCOUNTER — Ambulatory Visit: Payer: Medicare Other | Admitting: Physical Therapy

## 2017-09-06 DIAGNOSIS — M5440 Lumbago with sciatica, unspecified side: Secondary | ICD-10-CM | POA: Diagnosis not present

## 2017-09-06 DIAGNOSIS — M6281 Muscle weakness (generalized): Secondary | ICD-10-CM

## 2017-09-06 DIAGNOSIS — M25551 Pain in right hip: Secondary | ICD-10-CM | POA: Diagnosis not present

## 2017-09-06 DIAGNOSIS — R262 Difficulty in walking, not elsewhere classified: Secondary | ICD-10-CM | POA: Diagnosis not present

## 2017-09-07 NOTE — Therapy (Signed)
[facility] [address], [place], [address] Phone: [phone]   Fax:  [phone]  Physical Therapy Treatment  Patient Details  Name: Stacey [name] MRN: [id number] Date of Birth: 08/23/1940 Referring Provider: [name] PA-C   Encounter Date: 09/06/2017  PT End of Session - 09/06/17 2016    Visit Number  9    Number of Visits  12    Date for PT Re-Evaluation  09/16/17    Authorization Type  9 of 6 FOTO    PT Start Time  2013    PT Stop Time  2045    PT Time Calculation (min)  32 min    Activity Tolerance  Patient tolerated treatment well    Behavior During Therapy  [facility] for tasks assessed/performed       Past Medical History:  Diagnosis Date  . Abnormal nuclear stress test 05/24/2017  . Arthritis   . Asthma   . 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 Cancer ([place])   . Complication of anesthesia   . Depression   . Family history of adverse reaction to anesthesia    most of family - PONV  . Fibromyalgia   . Glaucoma   . Headache    migraines - 1-2x/mo  . HTN (hypertension) 03/09/2007  . Hyperlipidemia LDL goal <70 03/25/1993  . Hypertension   . Motion sickness    all moving vehicles  . PONV (postoperative nausea and vomiting)   . Thyroid disease     Past Surgical History:  Procedure Laterality Date  . ABDOMINAL HYSTERECTOMY    . APPENDECTOMY    . BLADDER SURGERY    . CATARACT EXTRACTION W/ INTRAOCULAR LENS  IMPLANT, BILATERAL    . COLONOSCOPY WITH PROPOFOL N/A 01/17/2016   Procedure: COLONOSCOPY WITH PROPOFOL;  Surgeon: [name], MD;  Location: [facility];  Service: Endoscopy;  Laterality: N/A;  . CORONARY ARTERY BYPASS GRAFT N/A 02/20/2017   Procedure: CORONARY ARTERY BYPASS GRAFTING (CABG) x , three using left internal mammary artery to left anterior descending coronary artery and right greater saphenous vein harvested  endoscopically to distal right and diagonal coronary arteries.;  Surgeon: Grace Isaac, MD;  Location: [facility];  Service: Open Heart Surgery;  Laterality: N/A;  . LEFT HEART CATH AND CORONARY ANGIOGRAPHY N/A 02/20/2017   Procedure: LEFT HEART CATH AND CORONARY ANGIOGRAPHY;  Surgeon: Isaias Cowman, MD;  Location: [facility];  Service: Cardiovascular;  Laterality: N/A;  . LEFT HEART CATH AND CORS/GRAFTS ANGIOGRAPHY N/A 05/24/2017   Procedure: LEFT HEART CATH AND CORS/GRAFTS ANGIOGRAPHY;  Surgeon: Sherren Mocha, MD;  Location: [facility] [facility];  Service: Cardiovascular;  Laterality: N/A;  . NASAL SINUS SURGERY    . POLYPECTOMY  01/17/2016   Procedure: POLYPECTOMY;  Surgeon: [name], MD;  Location: [facility];  Service: Endoscopy;;  . RIGHT OOPHORECTOMY    . TEE WITHOUT CARDIOVERSION N/A 02/20/2017   Procedure: TRANSESOPHAGEAL ECHOCARDIOGRAM (TEE);  Surgeon: Grace Isaac, MD;  Location: [facility];  Service: Open Heart Surgery;  Laterality: N/A;    There were no vitals filed for this visit.  Subjective Assessment - 09/06/17 2015    Subjective  Patient reports she is not hurting as much today with back and right hip pain due ot being able to rest over the weekend. She is exercising as instructed and did her exercise right before coming  to therapy today   Pertinent History  long history of back pain with arthritis with episodes of exacerbation for at least the past 10 years. Previous therapy for back pain with good results for pain control. Recently had MI with CABG x 3 02/20/2017 and she is recuperating from this and has been more sedentary.     Limitations  Sitting;Lifting;Standing;Walking;House hold activities;Other (comment) personal care, getting in/out of car    How long can you sit comfortably?  < 20 min.    How long can you stand comfortably?  unable to tolerated standing at all in one position    How long can you walk comfortably?  walking is painful and she  has to exercise due to recent MI    Diagnostic tests  X rays:     Patient Stated Goals  to get back to do things in moderation with less pain, be able to walk, sit, drive comfortably    Currently in Pain?  Yes    Pain Score  4     Pain Location  Back    Pain Orientation  Right;Lower    Pain Descriptors / Indicators  Aching;Spasm;Sore    Pain Type  Chronic pain    Pain Onset  More than a month ago    Pain Frequency  Constant         Objective:  Treatment: Manual therapy: 20 min; goal; improve pain, soft tissue elasticity Patient seated in massage chair: STM performed with superficial and deep techniques to thoracic to lumbar spine   Modalities: US/estim combination x 10 min: 1MHz /continuous 1.2w/cm2/highvolt 105 volts : patient seated in massage chair: performed treatment to right gluteus medius region  goal: pain, spasms; no adverse reactions noted  Patient response to treatment:improved soft tissue elasticity with decreased spasms up to 50% following treatment.    PT Education - 09/06/17 2016    Education provided  Yes    Education Details  re assessed HEP verbally   Person(s) Educated  Patient    Methods  Explanation    Comprehension  Verbalized understanding          PT Long Term Goals - 08/05/17 2027      PT LONG TERM GOAL #1   Title  improve FOTO to 30/100 demonstrating improved function with daily tasks    Baseline  FOTO 22/100    Status  New    Target Date  08/26/17      PT LONG TERM GOAL #2   Title  improve FOTO to 40/100 demonstrating improved function with daily tasks    Baseline  FOTO 22/100    Status  New    Target Date  09/16/17      PT LONG TERM GOAL #3   Title  patient will be independent with home program for pain control, exercises in order to transition to self management once discharged from physical therapy    Baseline  limited knowledge of appropriate pain control, exercise progression without guidance, cuing    Status  New     Target Date  09/16/17            Plan - 09/06/17 2108    Clinical Impression Statement  Patient is progressing slowly with current treatment due to chronic condition with back pain. she demonstrates improvement with decreasing spasms with manual techniques and modalities. She is compliant with home exercises.     Rehab Potential  Fair    Clinical Impairments Affecting Rehab Potential  (+)motivated,  acute on chronic pain, active lifestyle prior to this episode(-)chronic condition, pain, recent CABG x 3 02/20/2017, arthritis, HTN    PT Frequency  2x / week    PT Duration  6 weeks    PT Treatment/Interventions  Cryotherapy;Electrical Stimulation;Moist Heat;Ultrasound;Therapeutic exercise;Therapeutic activities;Neuromuscular re-education;Patient/family education;Manual techniques    PT Next Visit Plan  pain control, exercise progression as needed; electrical stimulation, moist heat    PT Home Exercise Plan  pain cotnrol, body mechanics, use of lumbar support for sitting, sleeping       Patient will benefit from skilled therapeutic intervention in order to improve the following deficits and impairments:  Pain, Improper body mechanics, Increased muscle spasms, Decreased activity tolerance, Decreased endurance, Decreased range of motion, Decreased strength, Impaired perceived functional ability  Visit Diagnosis: Pain in right hip  Low back pain with sciatica, sciatica laterality unspecified, unspecified back pain laterality, unspecified chronicity  Muscle weakness (generalized)  Difficulty in walking, not elsewhere classified     Problem List Patient Active Problem List   Diagnosis Date Noted  . Abnormal nuclear stress test 05/24/2017  . 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 PAF (paroxysmal atrial fibrillation) ([place])   . Non-STEMI (non-ST elevated myocardial infarction) ([place]) 02/20/2017  . 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 Moderate mitral  insufficiency 01/11/2017  . Personal history of colonic polyps   . Benign neoplasm of ascending colon   . Benign neoplasm of descending colon   . Chest pain, moderate coronary artery risk 09/16/2015  . Disequilibrium 09/16/2015  . Panic attacks 09/11/2015  . Ganglion cyst 08/16/2015  . Hypernatremia 07/04/2015  . Upper back pain 07/04/2015  . Other osteoarthritis involving multiple joints 02/06/2015  . Clinical depression 09/27/2014  . Dry mouth 09/27/2014  . Fibrositis 09/27/2014  . LBP (low back pain) 09/27/2014  . Avitaminosis D 09/27/2014  . Decreased leukocytes 09/27/2014  . Abnormal blood sugar 05/03/2009  . Insomnia 09/29/2007  . HTN (hypertension) 03/09/2007  . Adaptation reaction 01/29/2007  . Acid reflux 10/07/2006  . Menopausal and postmenopausal disorder 10/05/2006  . Headache, migraine 10/05/2006  . Arthritis, degenerative 03/23/1994  . Adult hypothyroidism 04/15/1993  . Hyperlipidemia LDL goal <70 03/25/1993    Jomarie Longs PT 09/07/2017, 9:15 PM  [facility] [address] [place], [address] Phone: [phone]   Fax:  [phone]  Name: Stacey [name] MRN: [id number] Date of Birth: Apr 30, 1941

## 2017-09-09 ENCOUNTER — Ambulatory Visit: Payer: Medicare Other | Admitting: Physical Therapy

## 2017-09-09 DIAGNOSIS — M6281 Muscle weakness (generalized): Secondary | ICD-10-CM | POA: Diagnosis not present

## 2017-09-09 DIAGNOSIS — M5440 Lumbago with sciatica, unspecified side: Secondary | ICD-10-CM | POA: Diagnosis not present

## 2017-09-09 DIAGNOSIS — M25551 Pain in right hip: Secondary | ICD-10-CM

## 2017-09-09 DIAGNOSIS — R262 Difficulty in walking, not elsewhere classified: Secondary | ICD-10-CM

## 2017-09-10 NOTE — Therapy (Signed)
[facility] [address], [place], [address] Phone: [phone]   Fax:  [phone]  Physical Therapy Treatment/ Progress Note  Patient Details  Name: Stacey [name] MRN: [id number] Date of Birth: Jan 19, 1941 Referring Provider: [name] PA-C   Encounter Date: 09/09/2017      Dates of reporting period 03/14/209 to  09/09/2017   PT End of Session - 09/09/17 1910    Visit Number  10    Number of Visits  12    Date for PT Re-Evaluation  09/16/17    Authorization Type  10 of 6 FOTO    PT Start Time  3875    PT Stop Time  1903    PT Time Calculation (min)  30 min    Activity Tolerance  Patient tolerated treatment well    Behavior During Therapy  [facility] for tasks assessed/performed       Past Medical History:  Diagnosis Date  . Abnormal nuclear stress test 05/24/2017  . Arthritis   . Asthma   . 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 Cancer ([place])   . Complication of anesthesia   . Depression   . Family history of adverse reaction to anesthesia    most of family - PONV  . Fibromyalgia   . Glaucoma   . Headache    migraines - 1-2x/mo  . HTN (hypertension) 03/09/2007  . Hyperlipidemia LDL goal <70 03/25/1993  . Hypertension   . Motion sickness    all moving vehicles  . PONV (postoperative nausea and vomiting)   . Thyroid disease     Past Surgical History:  Procedure Laterality Date  . ABDOMINAL HYSTERECTOMY    . APPENDECTOMY    . BLADDER SURGERY    . CATARACT EXTRACTION W/ INTRAOCULAR LENS  IMPLANT, BILATERAL    . COLONOSCOPY WITH PROPOFOL N/A 01/17/2016   Procedure: COLONOSCOPY WITH PROPOFOL;  Surgeon: [name], MD;  Location: [facility];  Service: Endoscopy;  Laterality: N/A;  . CORONARY ARTERY BYPASS GRAFT N/A 02/20/2017   Procedure: CORONARY ARTERY BYPASS GRAFTING (CABG) x , three using left internal mammary artery to left  anterior descending coronary artery and right greater saphenous vein harvested endoscopically to distal right and diagonal coronary arteries.;  Surgeon: Grace Isaac, MD;  Location: [facility];  Service: Open Heart Surgery;  Laterality: N/A;  . LEFT HEART CATH AND CORONARY ANGIOGRAPHY N/A 02/20/2017   Procedure: LEFT HEART CATH AND CORONARY ANGIOGRAPHY;  Surgeon: Isaias Cowman, MD;  Location: [place] [facility];  Service: Cardiovascular;  Laterality: N/A;  . LEFT HEART CATH AND CORS/GRAFTS ANGIOGRAPHY N/A 05/24/2017   Procedure: LEFT HEART CATH AND CORS/GRAFTS ANGIOGRAPHY;  Surgeon: Sherren Mocha, MD;  Location: [facility];  Service: Cardiovascular;  Laterality: N/A;  . NASAL SINUS SURGERY    . POLYPECTOMY  01/17/2016   Procedure: POLYPECTOMY;  Surgeon: [name], MD;  Location: [facility];  Service: Endoscopy;;  . RIGHT OOPHORECTOMY    . TEE WITHOUT CARDIOVERSION N/A 02/20/2017   Procedure: TRANSESOPHAGEAL ECHOCARDIOGRAM (TEE);  Surgeon: Grace Isaac, MD;  Location: [facility];  Service: Open Heart Surgery;  Laterality: N/A;    There were no vitals filed for this visit.  Subjective Assessment - 09/09/17 1833    Subjective  Patient reports she has already done all exercises right before coming to therapy. She has concern of back and right  hip pain. she reports she is feeling a little better with less pain with therapy treatments.     Pertinent History  long history of back pain with arthritis with episodes of exacerbation for at least the past 10 years. Previous therapy for back pain with good results for pain control. Recently had MI with CABG x 3 02/20/2017 and she is recuperating from this and has been more sedentary.     Limitations  Sitting;Lifting;Standing;Walking;House hold activities;Other (comment) personal care, getting in/out of car    How long can you sit comfortably?  < 20 min.    How long can you stand comfortably?  unable to tolerated standing at all in  one position    How long can you walk comfortably?  walking is painful and she has to exercise due to recent MI    Diagnostic tests  X rays:     Patient Stated Goals  to get back to do things in moderation with less pain, be able to walk, sit, drive comfortably    Currently in Pain?  Yes    Pain Score  5     Pain Location  Back    Pain Orientation  Right;Lower    Pain Descriptors / Indicators  Aching;Sore    Pain Type  Chronic pain    Pain Onset  More than a month ago    Pain Frequency  Constant       Objective: Gait: antalgic, improving posture and weight shifting to right LE  Palpation: spasm, increased tenderness lower lumbar spine, paraspinal muscles and bilateral gluteus medius muscles  Treatment: Manual therapy: 20 min; goal; improve pain, soft tissue elasticity Patient seated in massage chair: STM performed with superficial and deep techniques to thoracic to lumbar spine  Modalities: US/estim combination x67min: 1MHz /continuous 1.2w/cm2/highvolt 90volts : patient seated in massage chair: performed treatment to right and left gluteus medius region  goal: pain, spasms; no adverse reactions noted  Patient response to treatment:  Patient with decreased pain from 5/10 to 3/10. Patient with decreased spasms by 50% following STM.     PT Education - 09/09/17 1900    Education provided  Yes    Education Details  verbally reviewed HEP to continue with at home    Person(s) Educated  Patient    Methods  Explanation    Comprehension  Verbalized understanding          PT Long Term Goals - 09/09/17 1915      PT LONG TERM GOAL #1   Title  improve FOTO to 30/100 demonstrating improved function with daily tasks    Baseline  FOTO 22/100: to be assessed next session    Status  On-going      PT LONG TERM GOAL #2   Title  improve FOTO to 40/100 demonstrating improved function with daily tasks    Baseline  FOTO 22/100    Status  On-going    Target Date  09/16/17       PT LONG TERM GOAL #3   Title  patient will be independent with home program for pain control, exercises in order to transition to self management once discharged from physical therapy    Baseline  limited knowledge of appropriate pain control, exercise progression without guidance, cuing    Status  On-going    Target Date  09/16/17            Plan - 09/09/17 1935    Clinical Impression Statement  Patient demonstrated improvement  with decreased spasms and tenderness in lower back with treatment. She is progressing slowly with treatment due to chronic condition and with complication or recent cardiac surgery. she continues to be compliant with HEP and will require additional physical therapy intervention ot further decrease spasms and pain in back and right hip region in order to allow improved function with daily activities.     Rehab Potential  Fair    Clinical Impairments Affecting Rehab Potential  (+)motivated, acute on chronic pain, active lifestyle prior to this episode(-)chronic condition, pain, recent CABG x 3 02/20/2017, arthritis, HTN    PT Frequency  2x / week    PT Duration  6 weeks    PT Treatment/Interventions  Cryotherapy;Electrical Stimulation;Moist Heat;Ultrasound;Therapeutic exercise;Therapeutic activities;Neuromuscular re-education;Patient/family education;Manual techniques    PT Next Visit Plan  pain control, exercise progression as needed; electrical stimulation, moist heat    PT Home Exercise Plan  pain cotnrol, body mechanics, use of lumbar support for sitting, sleeping       Patient will benefit from skilled therapeutic intervention in order to improve the following deficits and impairments:  Pain, Improper body mechanics, Increased muscle spasms, Decreased activity tolerance, Decreased endurance, Decreased range of motion, Decreased strength, Impaired perceived functional ability  Visit Diagnosis: Pain in right hip  Low back pain with sciatica, sciatica laterality  unspecified, unspecified back pain laterality, unspecified chronicity  Muscle weakness (generalized)  Difficulty in walking, not elsewhere classified     Problem List Patient Active Problem List   Diagnosis Date Noted  . Abnormal nuclear stress test 05/24/2017  . 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 PAF (paroxysmal atrial fibrillation) ([place])   . Non-STEMI (non-ST elevated myocardial infarction) ([place]) 02/20/2017  . 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 Moderate mitral insufficiency 01/11/2017  . Personal history of colonic polyps   . Benign neoplasm of ascending colon   . Benign neoplasm of descending colon   . Chest pain, moderate coronary artery risk 09/16/2015  . Disequilibrium 09/16/2015  . Panic attacks 09/11/2015  . Ganglion cyst 08/16/2015  . Hypernatremia 07/04/2015  . Upper back pain 07/04/2015  . Other osteoarthritis involving multiple joints 02/06/2015  . Clinical depression 09/27/2014  . Dry mouth 09/27/2014  . Fibrositis 09/27/2014  . LBP (low back pain) 09/27/2014  . Avitaminosis D 09/27/2014  . Decreased leukocytes 09/27/2014  . Abnormal blood sugar 05/03/2009  . Insomnia 09/29/2007  . HTN (hypertension) 03/09/2007  . Adaptation reaction 01/29/2007  . Acid reflux 10/07/2006  . Menopausal and postmenopausal disorder 10/05/2006  . Headache, migraine 10/05/2006  . Arthritis, degenerative 03/23/1994  . Adult hypothyroidism 04/15/1993  . Hyperlipidemia LDL goal <70 03/25/1993    Jomarie Longs PT 09/10/2017, 2:59 PM  [facility] [address], [place], [address] Phone: [phone]   Fax:  [phone]  Name: Stacey [name] MRN: [id number] Date of Birth: 1940-08-03

## 2017-09-13 ENCOUNTER — Ambulatory Visit: Payer: Medicare Other | Admitting: Physical Therapy

## 2017-09-13 ENCOUNTER — Encounter: Payer: Self-pay | Admitting: Physical Therapy

## 2017-09-13 DIAGNOSIS — M25551 Pain in right hip: Secondary | ICD-10-CM | POA: Diagnosis not present

## 2017-09-13 DIAGNOSIS — R262 Difficulty in walking, not elsewhere classified: Secondary | ICD-10-CM

## 2017-09-13 DIAGNOSIS — M6281 Muscle weakness (generalized): Secondary | ICD-10-CM | POA: Diagnosis not present

## 2017-09-13 DIAGNOSIS — M5440 Lumbago with sciatica, unspecified side: Secondary | ICD-10-CM

## 2017-09-13 NOTE — Therapy (Signed)
[facility] [address] [place], [address] Phone: [phone]   Fax:  [phone]  Physical Therapy Treatment  Patient Details  Name: Stacey [name] MRN: [id number] Date of Birth: 07-11-1940 Referring Provider: [name] PA-C   Encounter Date: 09/13/2017  PT End of Session - 09/13/17 2212    Visit Number  11    Number of Visits  12    Date for PT Re-Evaluation  09/16/17    Authorization Type  11 of 6 FOTO    PT Start Time  1825    PT Stop Time  1900    PT Time Calculation (min)  35 min    Activity Tolerance  Patient tolerated treatment well    Behavior During Therapy  [facility] for tasks assessed/performed       Past Medical History:  Diagnosis Date  . Abnormal nuclear stress test 05/24/2017  . Arthritis   . Asthma   . 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 Cancer ([place])   . Complication of anesthesia   . Depression   . Family history of adverse reaction to anesthesia    most of family - PONV  . Fibromyalgia   . Glaucoma   . Headache    migraines - 1-2x/mo  . HTN (hypertension) 03/09/2007  . Hyperlipidemia LDL goal <70 03/25/1993  . Hypertension   . Motion sickness    all moving vehicles  . PONV (postoperative nausea and vomiting)   . Thyroid disease     Past Surgical History:  Procedure Laterality Date  . ABDOMINAL HYSTERECTOMY    . APPENDECTOMY    . BLADDER SURGERY    . CATARACT EXTRACTION W/ INTRAOCULAR LENS  IMPLANT, BILATERAL    . COLONOSCOPY WITH PROPOFOL N/A 01/17/2016   Procedure: COLONOSCOPY WITH PROPOFOL;  Surgeon: [name], MD;  Location: [place];  Service: Endoscopy;  Laterality: N/A;  . CORONARY ARTERY BYPASS GRAFT N/A 02/20/2017   Procedure: CORONARY ARTERY BYPASS GRAFTING (CABG) x , three using left internal mammary artery to left anterior descending coronary artery and right greater saphenous vein harvested  endoscopically to distal right and diagonal coronary arteries.;  Surgeon: Grace Isaac, MD;  Location: [place];  Service: Open Heart Surgery;  Laterality: N/A;  . LEFT HEART CATH AND CORONARY ANGIOGRAPHY N/A 02/20/2017   Procedure: LEFT HEART CATH AND CORONARY ANGIOGRAPHY;  Surgeon: Isaias Cowman, MD;  Location: [place] [facility];  Service: Cardiovascular;  Laterality: N/A;  . LEFT HEART CATH AND CORS/GRAFTS ANGIOGRAPHY N/A 05/24/2017   Procedure: LEFT HEART CATH AND CORS/GRAFTS ANGIOGRAPHY;  Surgeon: Sherren Mocha, MD;  Location: [place];  Service: Cardiovascular;  Laterality: N/A;  . NASAL SINUS SURGERY    . POLYPECTOMY  01/17/2016   Procedure: POLYPECTOMY;  Surgeon: [name], MD;  Location: [place];  Service: Endoscopy;;  . RIGHT OOPHORECTOMY    . TEE WITHOUT CARDIOVERSION N/A 02/20/2017   Procedure: TRANSESOPHAGEAL ECHOCARDIOGRAM (TEE);  Surgeon: Grace Isaac, MD;  Location: [place];  Service: Open Heart Surgery;  Laterality: N/A;    There were no vitals filed for this visit.  Subjective Assessment - 09/13/17 1825    Subjective  Patient reports she feels that she is improving with less pain in back and right hip however she is still limited in her function due to pain.     Pertinent History  long history  of back pain with arthritis with episodes of exacerbation for at least the past 10 years. Previous therapy for back pain with good results for pain control. Recently had MI with CABG x 3 02/20/2017 and she is recuperating from this and has been more sedentary.     Limitations  Sitting;Lifting;Standing;Walking;House hold activities;Other (comment) personal care, getting in/out of car    How long can you sit comfortably?  < 20 min.    How long can you stand comfortably?  unable to tolerated standing at all in one position    How long can you walk comfortably?  walking is painful and she has to exercise due to recent MI    Diagnostic tests  X rays:      Patient Stated Goals  to get back to do things in moderation with less pain, be able to walk, sit, drive comfortably    Currently in Pain?  Yes    Pain Score  5     Pain Location  Back    Pain Orientation  Right;Left;Lower    Pain Descriptors / Indicators  Aching;Tightness;Spasm;Sore    Pain Type  Chronic pain    Pain Onset  More than a month ago    Pain Frequency  Constant        Objective: Gait: antalgic and improved from previous session with improved cadence Palpation: spasm, increased tenderness upper lumbar spine, right>left and paraspinal muscles and bilateral gluteus medius muscles   Treatment: Manual therapy: 20 min; goal; improve pain, soft tissue elasticity Patient seated in massage chair: STM performed with superficial and deep techniques to thoracic to lumbar spine    Modalities: US/estim combination x 10 min: 1MHz /continuous 1.2w/cm2/highvolt 115 volts : patient seated in massage chair: performed treatment to right and left gluteus medius region  goal: pain, spasms; no adverse reactions noted   Patient response to treatment:  Patient improved gait pattern with decreased limp on right. Decreased pain from 5/10 to <3/10.Patient with decreased spasms and improved soft tissue elasticity by 50% following STM.           PT Education - 09/13/17 1900    Education provided  Yes    Education Details  progress with therapy    Person(s) Educated  Patient    Methods  Explanation    Comprehension  Verbalized understanding          PT Long Term Goals - 09/09/17 1915      PT LONG TERM GOAL #1   Title  improve FOTO to 30/100 demonstrating improved function with daily tasks    Baseline  FOTO 22/100: to be assessed next session    Status  On-going      PT LONG TERM GOAL #2   Title  improve FOTO to 40/100 demonstrating improved function with daily tasks    Baseline  FOTO 22/100    Status  On-going    Target Date  09/16/17      PT LONG TERM GOAL #3   Title   patient will be independent with home program for pain control, exercises in order to transition to self management once discharged from physical therapy    Baseline  limited knowledge of appropriate pain control, exercise progression without guidance, cuing    Status  On-going    Target Date  09/16/17            Plan - 09/13/17 2213    Clinical Impression Statement  Patient demonstrated decreased spasms and improved gait  pattern with decreased antalgic pattern following treatment. She continues with pain that limits function with daily activities and will benefit from additional physical therapy intervention.     Rehab Potential  Fair    Clinical Impairments Affecting Rehab Potential  (+)motivated, acute on chronic pain, active lifestyle prior to this episode(-)chronic condition, pain, recent CABG x 3 02/20/2017, arthritis, HTN    PT Frequency  2x / week    PT Duration  6 weeks    PT Treatment/Interventions  Cryotherapy;Electrical Stimulation;Moist Heat;Ultrasound;Therapeutic exercise;Therapeutic activities;Neuromuscular re-education;Patient/family education;Manual techniques    PT Next Visit Plan  pain control, exercise progression as needed; STM; FOTO   PT Home Exercise Plan  pain cotnrol, body mechanics, use of lumbar support for sitting, sleeping       Patient will benefit from skilled therapeutic intervention in order to improve the following deficits and impairments:  Pain, Improper body mechanics, Increased muscle spasms, Decreased activity tolerance, Decreased endurance, Decreased range of motion, Decreased strength, Impaired perceived functional ability  Visit Diagnosis: Pain in right hip  Low back pain with sciatica, sciatica laterality unspecified, unspecified back pain laterality, unspecified chronicity  Muscle weakness (generalized)  Difficulty in walking, not elsewhere classified     Problem List Patient Active Problem List   Diagnosis Date Noted  . Abnormal  nuclear stress test 05/24/2017  . 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 PAF (paroxysmal atrial fibrillation) ([place])   . Non-STEMI (non-ST elevated myocardial infarction) ([place]) 02/20/2017  . 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 Moderate mitral insufficiency 01/11/2017  . Personal history of colonic polyps   . Benign neoplasm of ascending colon   . Benign neoplasm of descending colon   . Chest pain, moderate coronary artery risk 09/16/2015  . Disequilibrium 09/16/2015  . Panic attacks 09/11/2015  . Ganglion cyst 08/16/2015  . Hypernatremia 07/04/2015  . Upper back pain 07/04/2015  . Other osteoarthritis involving multiple joints 02/06/2015  . Clinical depression 09/27/2014  . Dry mouth 09/27/2014  . Fibrositis 09/27/2014  . LBP (low back pain) 09/27/2014  . Avitaminosis D 09/27/2014  . Decreased leukocytes 09/27/2014  . Abnormal blood sugar 05/03/2009  . Insomnia 09/29/2007  . HTN (hypertension) 03/09/2007  . Adaptation reaction 01/29/2007  . Acid reflux 10/07/2006  . Menopausal and postmenopausal disorder 10/05/2006  . Headache, migraine 10/05/2006  . Arthritis, degenerative 03/23/1994  . Adult hypothyroidism 04/15/1993  . Hyperlipidemia LDL goal <70 03/25/1993    Jomarie Longs PT 09/13/2017, 10:21 PM  [facility] [address], [place], [address] Phone: [phone]   Fax:  [phone]  Name: MARSHAE AZAM MRN: [id number] Date of Birth: [birth year]

## 2017-09-15 ENCOUNTER — Encounter: Payer: Self-pay | Admitting: Physical Therapy

## 2017-09-15 ENCOUNTER — Ambulatory Visit: Payer: Medicare Other | Admitting: Physical Therapy

## 2017-09-15 DIAGNOSIS — M6281 Muscle weakness (generalized): Secondary | ICD-10-CM | POA: Diagnosis not present

## 2017-09-15 DIAGNOSIS — M5440 Lumbago with sciatica, unspecified side: Secondary | ICD-10-CM | POA: Diagnosis not present

## 2017-09-15 DIAGNOSIS — M25551 Pain in right hip: Secondary | ICD-10-CM | POA: Diagnosis not present

## 2017-09-15 DIAGNOSIS — R262 Difficulty in walking, not elsewhere classified: Secondary | ICD-10-CM

## 2017-09-15 NOTE — Therapy (Signed)
[facility] [address], [place], [address] Phone: [phone]   Fax:  [phone]  Physical Therapy Treatment  Patient Details  Name: Stacey [name] MRN: [id number] Date of Birth: 1940-06-03 Referring Provider: [name] PA-C   Encounter Date: 09/15/2017  PT End of Session - 09/15/17 2106    Visit Number  12    Number of Visits  12    Date for PT Re-Evaluation  09/16/17    Authorization Type  12 of 6 FOTO    PT Start Time  1820    PT Stop Time  1900    PT Time Calculation (min)  40 min    Activity Tolerance  Patient tolerated treatment well    Behavior During Therapy  [facility] for tasks assessed/performed       Past Medical History:  Diagnosis Date  . Abnormal nuclear stress test 05/24/2017  . Arthritis   . Asthma   . 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 Cancer ([place])   . Complication of anesthesia   . Depression   . Family history of adverse reaction to anesthesia    most of family - PONV  . Fibromyalgia   . Glaucoma   . Headache    migraines - 1-2x/mo  . HTN (hypertension) 03/09/2007  . Hyperlipidemia LDL goal <70 03/25/1993  . Hypertension   . Motion sickness    all moving vehicles  . PONV (postoperative nausea and vomiting)   . Thyroid disease     Past Surgical History:  Procedure Laterality Date  . ABDOMINAL HYSTERECTOMY    . APPENDECTOMY    . BLADDER SURGERY    . CATARACT EXTRACTION W/ INTRAOCULAR LENS  IMPLANT, BILATERAL    . COLONOSCOPY WITH PROPOFOL N/A 01/17/2016   Procedure: COLONOSCOPY WITH PROPOFOL;  Surgeon: [name], MD;  Location: [facility];  Service: Endoscopy;  Laterality: N/A;  . CORONARY ARTERY BYPASS GRAFT N/A 02/20/2017   Procedure: CORONARY ARTERY BYPASS GRAFTING (CABG) x , three using left internal mammary artery to left anterior descending coronary artery and right greater saphenous vein harvested  endoscopically to distal right and diagonal coronary arteries.;  Surgeon: Grace Isaac, MD;  Location: [facility];  Service: Open Heart Surgery;  Laterality: N/A;  . LEFT HEART CATH AND CORONARY ANGIOGRAPHY N/A 02/20/2017   Procedure: LEFT HEART CATH AND CORONARY ANGIOGRAPHY;  Surgeon: Isaias Cowman, MD;  Location: [place] [facility];  Service: Cardiovascular;  Laterality: N/A;  . LEFT HEART CATH AND CORS/GRAFTS ANGIOGRAPHY N/A 05/24/2017   Procedure: LEFT HEART CATH AND CORS/GRAFTS ANGIOGRAPHY;  Surgeon: Sherren Mocha, MD;  Location: [facility];  Service: Cardiovascular;  Laterality: N/A;  . NASAL SINUS SURGERY    . POLYPECTOMY  01/17/2016   Procedure: POLYPECTOMY;  Surgeon: [name], MD;  Location: [facility];  Service: Endoscopy;;  . RIGHT OOPHORECTOMY    . TEE WITHOUT CARDIOVERSION N/A 02/20/2017   Procedure: TRANSESOPHAGEAL ECHOCARDIOGRAM (TEE);  Surgeon: Grace Isaac, MD;  Location: [facility];  Service: Open Heart Surgery;  Laterality: N/A;    There were no vitals filed for this visit.  Subjective Assessment - 09/15/17 1840    Subjective  Patient reports she continues with mid back pain and lower back pain that is improving slowly. she continues with pain in lower back that limits her function with walking and daily tasks.    Pertinent  History  long history of back pain with arthritis with episodes of exacerbation for at least the past 10 years. Previous therapy for back pain with good results for pain control. Recently had MI with CABG x 3 02/20/2017 and she is recuperating from this and has been more sedentary.     Limitations  Sitting;Lifting;Standing;Walking;House hold activities;Other (comment) personal care, getting in/out of car    How long can you sit comfortably?  < 20 min.    How long can you stand comfortably?  unable to tolerated standing at all in one position    How long can you walk comfortably?  walking is painful and she has to exercise due to  recent MI    Diagnostic tests  X rays:     Patient Stated Goals  to get back to do things in moderation with less pain, be able to walk, sit, drive comfortably    Currently in Pain?  Yes    Pain Score  4     Pain Location  Back    Pain Orientation  Right;Left;Mid;Lower    Pain Descriptors / Indicators  Aching;Spasm;Sore;Tightness    Pain Type  Chronic pain    Pain Onset  More than a month ago    Pain Frequency  Constant         Objective: Gait: mildly antalgic  Palpation: spasm, increased tenderness with moderate spasms mid back to lumbar spine, right>leftand tender along bilatera lgluteal muscles    Treatment: Manual therapy: 28 min; goal; improve pain, soft tissue elasticity Patient seated in massage chair: STM performed with superficial and deep techniques to thoracic to lumbar spine    Modalities: US/estim combination x 12 min: 1MHz /pulsed 50% @ 1.2w/cm2/highvolt 105 volts : patient seated in massage chair: performed treatment to right and left gluteus medius region goal: pain, spasms; no adverse reactions noted   Patient response to treatment:  improved soft tissue elasticity and decreased pain by 30% following treatment of STM, US/estim combination.     PT Education - 09/15/17 2104    Education provided  Yes    Education Details  re assessed home exercise    Person(s) Educated  Patient    Methods  Explanation    Comprehension  Verbalized understanding          PT Long Term Goals - 09/09/17 1915      PT LONG TERM GOAL #1   Title  improve FOTO to 30/100 demonstrating improved function with daily tasks    Baseline  FOTO 22/100: to be assessed next session    Status  On-going      PT LONG TERM GOAL #2   Title  improve FOTO to 40/100 demonstrating improved function with daily tasks    Baseline  FOTO 22/100    Status  On-going    Target Date  09/16/17      PT LONG TERM GOAL #3   Title  patient will be independent with home program for pain control, exercises  in order to transition to self management once discharged from physical therapy    Baseline  limited knowledge of appropriate pain control, exercise progression without guidance, cuing    Status  On-going    Target Date  09/16/17            Plan - 09/15/17 2109    Clinical Impression Statement  Patient is progressing slowly with decreasing pain in lower back due to chronic condition and recent cardiac surgery. she demonstrates decreasing  spasms and improving soft tissue elasticity with manual therapy and modalities. She should continue to improve with additional physical therapy intervention.     Rehab Potential  Fair    Clinical Impairments Affecting Rehab Potential  (+)motivated, acute on chronic pain, active lifestyle prior to this episode(-)chronic condition, pain, recent CABG x 3 02/20/2017, arthritis, HTN    PT Frequency  2x / week    PT Duration  6 weeks    PT Treatment/Interventions  Cryotherapy;Electrical Stimulation;Moist Heat;Ultrasound;Therapeutic exercise;Therapeutic activities;Neuromuscular re-education;Patient/family education;Manual techniques    PT Next Visit Plan  pain control, exercise progression as needed; electrical stimulation, moist heat    PT Home Exercise Plan  pain cotnrol, body mechanics, use of lumbar support for sitting, sleeping       Patient will benefit from skilled therapeutic intervention in order to improve the following deficits and impairments:  Pain, Improper body mechanics, Increased muscle spasms, Decreased activity tolerance, Decreased endurance, Decreased range of motion, Decreased strength, Impaired perceived functional ability  Visit Diagnosis: Pain in right hip  Low back pain with sciatica, sciatica laterality unspecified, unspecified back pain laterality, unspecified chronicity  Muscle weakness (generalized)  Difficulty in walking, not elsewhere classified     Problem List Patient Active Problem List   Diagnosis Date Noted  .  Abnormal nuclear stress test 05/24/2017  . 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 PAF (paroxysmal atrial fibrillation) ([place])   . Non-STEMI (non-ST elevated myocardial infarction) ([place]) 02/20/2017  . 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 Moderate mitral insufficiency 01/11/2017  . Personal history of colonic polyps   . Benign neoplasm of ascending colon   . Benign neoplasm of descending colon   . Chest pain, moderate coronary artery risk 09/16/2015  . Disequilibrium 09/16/2015  . Panic attacks 09/11/2015  . Ganglion cyst 08/16/2015  . Hypernatremia 07/04/2015  . Upper back pain 07/04/2015  . Other osteoarthritis involving multiple joints 02/06/2015  . Clinical depression 09/27/2014  . Dry mouth 09/27/2014  . Fibrositis 09/27/2014  . LBP (low back pain) 09/27/2014  . Avitaminosis D 09/27/2014  . Decreased leukocytes 09/27/2014  . Abnormal blood sugar 05/03/2009  . Insomnia 09/29/2007  . HTN (hypertension) 03/09/2007  . Adaptation reaction 01/29/2007  . Acid reflux 10/07/2006  . Menopausal and postmenopausal disorder 10/05/2006  . Headache, migraine 10/05/2006  . Arthritis, degenerative 03/23/1994  . Adult hypothyroidism 04/15/1993  . Hyperlipidemia LDL goal <70 03/25/1993    Jomarie Longs PT 09/15/2017, 9:15 PM  [facility] [address] [place], [address] Phone: [phone]   Fax:  [phone]  Name: Stacey [name] MRN: [id number] Date of Birth: 1940-11-26

## 2017-09-16 ENCOUNTER — Encounter: Payer: Self-pay | Admitting: Physician Assistant

## 2017-09-16 ENCOUNTER — Ambulatory Visit (INDEPENDENT_AMBULATORY_CARE_PROVIDER_SITE_OTHER): Payer: Medicare Other | Admitting: Physician Assistant

## 2017-09-16 VITALS — BP 138/78 | HR 67 | Temp 97.8°F | Resp 16 | Wt 165.0 lb

## 2017-09-16 DIAGNOSIS — R319 Hematuria, unspecified: Secondary | ICD-10-CM | POA: Diagnosis not present

## 2017-09-16 DIAGNOSIS — N3090 Cystitis, unspecified without hematuria: Secondary | ICD-10-CM

## 2017-09-16 LAB — POCT URINALYSIS DIPSTICK
Bilirubin, UA: NEGATIVE
Glucose, UA: NEGATIVE
Ketones, UA: NEGATIVE
Nitrite, UA: NEGATIVE
Protein, UA: 100
Spec Grav, UA: 1.015 (ref 1.010–1.025)
Urobilinogen, UA: 0.2 E.U./dL
pH, UA: 7 (ref 5.0–8.0)

## 2017-09-16 MED ORDER — SULFAMETHOXAZOLE-TRIMETHOPRIM 800-160 MG PO TABS: 1.0000 | ORAL_TABLET | Freq: Two times a day (BID) | ORAL | 0 refills | Status: AC

## 2017-09-16 NOTE — Patient Instructions (Signed)

## 2017-09-16 NOTE — Progress Notes (Signed)
Patient: Stacey [name] Female    DOB: [birth year]   77 y.o.   MRN: [id number] Visit Date: 09/16/2017  Today's Provider: Trinna Post, PA-C   Chief Complaint  Patient presents with  . Hematuria   Subjective:    Hematuria  This is a new problem. The current episode started yesterday. The problem is unchanged. She describes the hematuria as gross hematuria. She describes her urine color as dark red. Associated symptoms include abdominal pain, chills and dysuria (burning). Pertinent negatives include no fever, genital pain, hesitancy, inability to urinate, nausea or vomiting. (Also rectal pain and low back pain)       Allergies  Allergen Reactions  . Cephalexin Anaphylaxis  . Atorvastatin   . Morphine And Related Hives  . Novocain [Procaine]     Chest pain  . Shellfish Allergy Swelling    angioedema  . Statins Other (See Comments)    Muscle pain  . Tomato     (by testing) - also beans, wheat, peas  . Penicillins Hives and Rash     Current Outpatient Medications:  .  acetaminophen (TYLENOL) 325 MG tablet, Take 2 tablets (650 mg total) by mouth every 6 (six) hours as needed for mild pain., Disp: , Rfl:  .  albuterol (VENTOLIN HFA) 108 (90 Base) MCG/ACT inhaler, INHALE 2 PUFFS BY MOUTH EVERY 4 TO 6 HOURS AS NEEDED FOR SHORTNESS OF BREATH, Disp: 18 g, Rfl: 5 .  amLODipine (NORVASC) 5 MG tablet, Take 1 tablet (5 mg total) daily by mouth., Disp: 90 tablet, Rfl: 3 .  aspirin 81 MG tablet, ASPIRIN, 81MG  (Oral Tablet Delayed Release)  1 Every Day for 0 days  Quantity: 0.00;  Refills: 0   Ordered :17-November-2010  [name] ;  Started 05-Oct-2006 Active Comments: DX: 401.9, Disp: , Rfl:  .  buPROPion (WELLBUTRIN XL) 300 MG 24 hr tablet, Take 1 tablet (300 mg total) by mouth daily., Disp: 90 tablet, Rfl: 3 .  capsaicin (ZOSTRIX) 0.025 % cream, Apply 1 application topically as needed (nerve pain in chest)., Disp: , Rfl:  .  celecoxib (CELEBREX) 200 MG capsule, Take 1 capsule  (200 mg total) by mouth daily., Disp: 30 capsule, Rfl: 5 .  Cholecalciferol (VITAMIN D) 2000 units tablet, Take 2,000 Units by mouth daily., Disp: , Rfl:  .  Cyanocobalamin (B-12) 500 MCG TABS, Take by mouth., Disp: , Rfl:  .  diazepam (VALIUM) 5 MG tablet, Take 1 tablet (5 mg total) by mouth every 12 (twelve) hours as needed for anxiety., Disp: 60 tablet, Rfl: 1 .  EPINEPHrine (EPIPEN 2-PAK) 0.3 mg/0.3 mL IJ SOAJ injection, INJECT AS DIRECTED FOR SEVERE ALLERGIC REACTION, Disp: 1 Device, Rfl: 1 .  levothyroxine (SYNTHROID, LEVOTHROID) 100 MCG tablet, Take 1 tablet (100 mcg total) by mouth daily., Disp: 90 tablet, Rfl: 3 .  lisinopril (PRINIVIL,ZESTRIL) 40 MG tablet, Take 1 tablet (40 mg total) by mouth daily., Disp: 90 tablet, Rfl: 3 .  metoprolol tartrate (LOPRESSOR) 25 MG tablet, Take 1 tablet (25 mg total) by mouth 2 (two) times daily., Disp: 60 tablet, Rfl: 11 .  Multiple Vitamin (MULTI-VITAMINS) TABS, Take 1 tablet by mouth daily., Disp: , Rfl:  .  oxymetazoline (AFRIN) 0.05 % nasal spray, Place 1 spray into both nostrils as needed for congestion., Disp: , Rfl:  .  rosuvastatin (CRESTOR) 40 MG tablet, Take 1 tablet (40 mg total) by mouth daily., Disp: 90 tablet, Rfl: 3 .  traMADol (ULTRAM) 50 MG  tablet, Take 1 tablet (50 mg total) by mouth every 6 (six) hours as needed for moderate pain., Disp: 30 tablet, Rfl: 0 .  zolpidem (AMBIEN) 5 MG tablet, Take 1 tablet (5 mg total) by mouth at bedtime as needed. for sleep, Disp: 30 tablet, Rfl: 5  Review of Systems  Constitutional: Positive for chills. Negative for appetite change, fatigue and fever.  Respiratory: Negative for chest tightness and shortness of breath.   Cardiovascular: Negative for chest pain and palpitations.  Gastrointestinal: Positive for abdominal pain. Negative for nausea and vomiting.  Genitourinary: Positive for dysuria (burning) and hematuria. Negative for hesitancy.  Neurological: Negative for dizziness and weakness.     Social History   Tobacco Use  . Smoking status: Former Smoker    Last attempt to quit: 05/26/1975    Years since quitting: 42.3  . Smokeless tobacco: Never Used  . Tobacco comment: quit 45  years ago   Substance Use Topics  . Alcohol use: No   Objective:   BP 138/78 (BP Location: Left Arm, Patient Position: Sitting, Cuff Size: Normal)   Pulse 67   Temp 97.8 F (36.6 C) (Oral)   Resp 16   Wt 165 lb (74.8 kg)   SpO2 98% Comment: room air  BMI 30.67 kg/m  There were no vitals filed for this visit.   Physical Exam  Constitutional: She is oriented to person, place, and time. She appears well-developed and well-nourished. No distress.  Cardiovascular: Normal rate and regular rhythm.  Pulmonary/Chest: Effort normal and breath sounds normal.  Abdominal: Soft. Bowel sounds are normal. She exhibits no distension. There is tenderness in the suprapubic area. There is no rebound, no guarding and no CVA tenderness.  Neurological: She is alert and oriented to person, place, and time.  Skin: Skin is warm and dry. She is not diaphoretic.  Psychiatric: She has a normal mood and affect. Her behavior is normal.        Assessment & Plan:     1. Hematuria, unspecified type  - POCT Urinalysis Dipstick  2. Cystitis  - Urine Culture - Urinalysis, Routine w reflex microscopic - sulfamethoxazole-trimethoprim (BACTRIM DS) 800-160 MG tablet; Take 1 tablet by mouth 2 (two) times daily for 5 days.  Dispense: 10 tablet; Refill: 0  Return if symptoms worsen or fail to improve.  The entirety of the information documented in the History of Present Illness, Review of Systems and Physical Exam were personally obtained by me. Portions of this information were initially documented by Meyer Cory, CMA and reviewed by me for thoroughness and accuracy.         Trinna Post, PA-C  [facility]

## 2017-09-17 LAB — MICROSCOPIC EXAMINATION
Casts: NONE SEEN /lpf
Epithelial Cells (non renal): NONE SEEN /hpf (ref 0–10)
RBC, UA: >30 /hpf — AB (ref 0–2)

## 2017-09-17 LAB — URINALYSIS, ROUTINE W REFLEX MICROSCOPIC
Bilirubin, UA: NEGATIVE
Glucose, UA: NEGATIVE
Ketones, UA: NEGATIVE
Nitrite, UA: POSITIVE — AB
Specific Gravity, UA: 1.018 (ref 1.005–1.030)
Urobilinogen, Ur: 0.2 mg/dL (ref 0.2–1.0)
pH, UA: 6.5 (ref 5.0–7.5)

## 2017-09-18 LAB — URINE CULTURE

## 2017-09-20 ENCOUNTER — Encounter: Payer: Medicare Other | Admitting: Physical Therapy

## 2017-09-20 ENCOUNTER — Telehealth: Payer: Self-pay

## 2017-09-20 NOTE — Telephone Encounter (Signed)
-----   Message from Trinna Post, [place] sent at 09/20/2017  1:57 PM EDT ----- Urine culture positive for E. Coli which was sensitive to Bactrim prescribed. How is she feeling?

## 2017-09-20 NOTE — Telephone Encounter (Signed)
LMTCB 09/20/2017  Thanks,   -Mickel Baas

## 2017-09-22 ENCOUNTER — Encounter: Payer: Medicare Other | Admitting: Physical Therapy

## 2017-09-24 NOTE — Telephone Encounter (Signed)
Pt advised.   Thanks,   -Jacksen Isip  

## 2017-10-06 ENCOUNTER — Ambulatory Visit: Payer: Medicare Other | Admitting: Physical Therapy

## 2017-10-11 ENCOUNTER — Ambulatory Visit: Payer: Medicare Other | Attending: Orthopedic Surgery | Admitting: Physical Therapy

## 2017-10-11 ENCOUNTER — Encounter: Payer: Self-pay | Admitting: Physical Therapy

## 2017-10-11 DIAGNOSIS — R262 Difficulty in walking, not elsewhere classified: Secondary | ICD-10-CM

## 2017-10-11 DIAGNOSIS — M25551 Pain in right hip: Secondary | ICD-10-CM | POA: Insufficient documentation

## 2017-10-11 DIAGNOSIS — M5440 Lumbago with sciatica, unspecified side: Secondary | ICD-10-CM

## 2017-10-11 DIAGNOSIS — M6281 Muscle weakness (generalized): Secondary | ICD-10-CM

## 2017-10-12 NOTE — Therapy (Signed)
[facility] [address] [place], [address] Phone: [phone]   Fax:  [phone]  Physical Therapy Treatment  Patient Details  Name: Stacey [name] MRN: [id number] Date of Birth: 05/30/1940 Referring Provider: [name] PA-C   Encounter Date: 10/11/2017  PT End of Session - 10/11/17 1823    Visit Number  13    Number of Visits  24    Date for PT Re-Evaluation  11/22/17    Authorization Type  13 of  12 FOTO    PT Start Time  1818    PT Stop Time  1903    PT Time Calculation (min)  45 min    Activity Tolerance  Patient tolerated treatment well    Behavior During Therapy  [facility] for tasks assessed/performed       Past Medical History:  Diagnosis Date  . Abnormal nuclear stress test 05/24/2017  . Arthritis   . Asthma   . 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 Cancer ([place])   . Complication of anesthesia   . Depression   . Family history of adverse reaction to anesthesia    most of family - PONV  . Fibromyalgia   . Glaucoma   . Headache    migraines - 1-2x/mo  . HTN (hypertension) 03/09/2007  . Hyperlipidemia LDL goal <70 03/25/1993  . Hypertension   . Motion sickness    all moving vehicles  . PONV (postoperative nausea and vomiting)   . Thyroid disease     Past Surgical History:  Procedure Laterality Date  . ABDOMINAL HYSTERECTOMY    . APPENDECTOMY    . BLADDER SURGERY    . CATARACT EXTRACTION W/ INTRAOCULAR LENS  IMPLANT, BILATERAL    . COLONOSCOPY WITH PROPOFOL N/A 01/17/2016   Procedure: COLONOSCOPY WITH PROPOFOL;  Surgeon: [name], MD;  Location: [facility];  Service: Endoscopy;  Laterality: N/A;  . CORONARY ARTERY BYPASS GRAFT N/A 02/20/2017   Procedure: CORONARY ARTERY BYPASS GRAFTING (CABG) x , three using left internal mammary artery to left anterior descending coronary artery and right greater saphenous vein harvested  endoscopically to distal right and diagonal coronary arteries.;  Surgeon: Grace Isaac, MD;  Location: [facility];  Service: Open Heart Surgery;  Laterality: N/A;  . LEFT HEART CATH AND CORONARY ANGIOGRAPHY N/A 02/20/2017   Procedure: LEFT HEART CATH AND CORONARY ANGIOGRAPHY;  Surgeon: Isaias Cowman, MD;  Location: [facility];  Service: Cardiovascular;  Laterality: N/A;  . LEFT HEART CATH AND CORS/GRAFTS ANGIOGRAPHY N/A 05/24/2017   Procedure: LEFT HEART CATH AND CORS/GRAFTS ANGIOGRAPHY;  Surgeon: Sherren Mocha, MD;  Location: [facility];  Service: Cardiovascular;  Laterality: N/A;  . NASAL SINUS SURGERY    . POLYPECTOMY  01/17/2016   Procedure: POLYPECTOMY;  Surgeon: [name], MD;  Location: [facility];  Service: Endoscopy;;  . RIGHT OOPHORECTOMY    . TEE WITHOUT CARDIOVERSION N/A 02/20/2017   Procedure: TRANSESOPHAGEAL ECHOCARDIOGRAM (TEE);  Surgeon: Grace Isaac, MD;  Location: [facility];  Service: Open Heart Surgery;  Laterality: N/A;    There were no vitals filed for this visit.  Subjective Assessment - 10/11/17 1819    Subjective  Patient reports she is feeling about the same with her back. She reports that the therapy is helping with her pain and she is exercising as instructed at home daily.  She is still not  able to perform all that is required of her at home with household chores and daycare work responsibilities due to back pain and weakness and would like to continue to see this improve.    Pertinent History  long history of back pain with arthritis with episodes of exacerbation for at least the past 10 years. Previous therapy for back pain with good results for pain control. Recently had MI with CABG x 3 02/20/2017 and she is recuperating from this and has been more sedentary.     Limitations  Sitting;Lifting;Standing;Walking;House hold activities;Other (comment) personal care, getting in/out of car    How long can you sit comfortably?  < 20 min.     How long can you stand comfortably?  unable to tolerated standing at all in one position    How long can you walk comfortably?  walking is painful and she has to exercise due to recent MI    Diagnostic tests  X rays:     Patient Stated Goals  to get back to do things in moderation with less pain, be able to walk, sit, drive comfortably    Currently in Pain?  Yes    Pain Score  4     Pain Location  Back    Pain Orientation  Right;Left;Mid;Lower    Pain Descriptors / Indicators  Aching;Spasm;Tightness    Pain Type  Chronic pain    Pain Onset  More than a month ago    Pain Frequency  Constant         [facility] PT Assessment - 10/12/17 0001      Assessment   Medical Diagnosis  right hip pain osteoarthritis and back pain    Referring Provider  [name] PA-C    Onset Date/Surgical Date  08/23/16         Objective: Gait: mildly antalgic, slow cadence Transfers on/off massage chair with cautious movement, hesitation with reports of pain in lower lumbar region; transfers sit to stand with cautious, slow movement with reported pain in lower back and LE's/hips Palpation: spasm, increased tenderness with moderate spasms mid back to lumbar spine, right>left and tender along bilateral gluteal muscles; improved from previous session Outcome measure: FOTO score 46/100   Treatment: Manual therapy:65min; goal; improve pain, soft tissue elasticity Patient seated in massage chair: STM performed with superficial and deep techniques to thoracic to lumbar spine  Modalities: US/estim combination x84min: 1MHz /pulsed 50% @ 1.2w/cm2/highvolt100volts : patient seatedin massage chair: performed treatment to rightand leftgluteus medius region goal: pain, spasms; no adverse reactions noted  Patient response to treatment: improved soft tissue elasticity with decreased tenderness along lower lumbar spine and gluteus medius muscles. Improved soft tissue elasticity by 30% following treatment.        PT Education - 10/11/17 1900    Education provided  Yes    Education Details  re assessed home program; discussed goals and progress and FOTO scores    Person(s) Educated  Patient    Methods  Explanation    Comprehension  Verbalized understanding          PT Long Term Goals - 10/11/17 1927      PT LONG TERM GOAL #1   Title  improve FOTO to 30/100 demonstrating improved function with daily tasks    Baseline  FOTO 22/100: 10/11/17 46/100    Status  Achieved      PT LONG TERM GOAL #2   Title  improve FOTO to 40/100 demonstrating improved function with daily tasks  Baseline  FOTO 22/100; FOTO 46/100    Status  Achieved      PT LONG TERM GOAL #3   Title  patient will be independent with home program for pain control, exercises in order to transition to self management once discharged from physical therapy    Baseline  limited knowledge of appropriate pain control, exercise progression without guidance, cuing    Status  On-going    Target Date  11/23/17      PT LONG TERM GOAL #4   Title  improve FOTO to 50/100 demonstrating improved function with daily tasks    Baseline  FOTO 46/100    Status  New    Target Date  11/23/17            Plan - 10/11/17 1924    Clinical Impression Statement  Patient is progressing slowly with pain reduction in lower back due to chronic condition and recent cardiac surgery. She demonstrates decreasing spasm and improving soft tissue elasticity with manual therapy and modalities. Her FOTO socre demosntrates signifancant improvement from 22% to 46% and she should continue to improve further with continued physical therapy intervention.     Rehab Potential  Fair    Clinical Impairments Affecting Rehab Potential  (+)motivated, acute on chronic pain, active lifestyle prior to this episode(-)chronic condition, pain, recent CABG x 3 02/20/2017, arthritis, HTN    PT Frequency  1x / week    PT Duration  6 weeks    PT Treatment/Interventions   Cryotherapy;Electrical Stimulation;Moist Heat;Ultrasound;Therapeutic exercise;Therapeutic activities;Neuromuscular re-education;Patient/family education;Manual techniques    PT Next Visit Plan  pain control, exercise progression as needed; electrical stimulation, moist heat    PT Home Exercise Plan  pain cotnrol, body mechanics, use of lumbar support for sitting, sleeping       Patient will benefit from skilled therapeutic intervention in order to improve the following deficits and impairments:  Pain, Improper body mechanics, Increased muscle spasms, Decreased activity tolerance, Decreased endurance, Decreased range of motion, Decreased strength, Impaired perceived functional ability  Visit Diagnosis: Pain in right hip - Plan: PT plan of care cert/re-cert  Low back pain with sciatica, sciatica laterality unspecified, unspecified back pain laterality, unspecified chronicity - Plan: PT plan of care cert/re-cert  Muscle weakness (generalized) - Plan: PT plan of care cert/re-cert  Difficulty in walking, not elsewhere classified - Plan: PT plan of care cert/re-cert     Problem List Patient Active Problem List   Diagnosis Date Noted  . Abnormal nuclear stress test 05/24/2017  . 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 PAF (paroxysmal atrial fibrillation) ([place])   . Non-STEMI (non-ST elevated myocardial infarction) ([place]) 02/20/2017  . 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 Moderate mitral insufficiency 01/11/2017  . Personal history of colonic polyps   . Benign neoplasm of ascending colon   . Benign neoplasm of descending colon   . Chest pain, moderate coronary artery risk 09/16/2015  . Disequilibrium 09/16/2015  . Panic attacks 09/11/2015  . Ganglion cyst 08/16/2015  . Hypernatremia 07/04/2015  . Upper back pain 07/04/2015  . Other osteoarthritis involving multiple joints 02/06/2015  . Clinical depression 09/27/2014  . Dry mouth 09/27/2014  .  Fibrositis 09/27/2014  . LBP (low back pain) 09/27/2014  . Avitaminosis D 09/27/2014  . Decreased leukocytes 09/27/2014  . Abnormal blood sugar 05/03/2009  . Insomnia 09/29/2007  . HTN (hypertension) 03/09/2007  . Adaptation reaction 01/29/2007  . Acid reflux 10/07/2006  . Menopausal  and postmenopausal disorder 10/05/2006  . Headache, migraine 10/05/2006  . Arthritis, degenerative 03/23/1994  . Adult hypothyroidism 04/15/1993  . Hyperlipidemia LDL goal <70 03/25/1993    Jomarie Longs PT 10/12/2017, 6:40 PM  [facility] [address], [place], [address] Phone: [phone]   Fax:  [phone]  Name: Stacey [name] MRN: [id number] Date of Birth: 07-06-1940

## 2017-10-13 ENCOUNTER — Encounter: Payer: Self-pay | Admitting: Physical Therapy

## 2017-10-13 ENCOUNTER — Ambulatory Visit: Payer: Medicare Other | Admitting: Physical Therapy

## 2017-10-13 DIAGNOSIS — R262 Difficulty in walking, not elsewhere classified: Secondary | ICD-10-CM

## 2017-10-13 DIAGNOSIS — M5440 Lumbago with sciatica, unspecified side: Secondary | ICD-10-CM

## 2017-10-13 DIAGNOSIS — M25551 Pain in right hip: Secondary | ICD-10-CM

## 2017-10-13 DIAGNOSIS — M6281 Muscle weakness (generalized): Secondary | ICD-10-CM

## 2017-10-14 NOTE — Therapy (Signed)
[facility] [address] [place], [address] Phone: [phone]   Fax:  [phone]  Physical Therapy Treatment  Patient Details  Name: Stacey [name] MRN: [id number] Date of Birth: [birth year] Referring Provider: [name] PA-C   Encounter Date: 10/13/2017  PT End of Session - 10/13/17 1927    Visit Number  14    Number of Visits  24    Date for PT Re-Evaluation  11/22/17    Authorization Type  14 of  19 FOTO    PT Start Time  1825    PT Stop Time  1910    PT Time Calculation (min)  45 min    Activity Tolerance  Patient tolerated treatment well    Behavior During Therapy  [facility] for tasks assessed/performed       Past Medical History:  Diagnosis Date  . Abnormal nuclear stress test 05/24/2017  . Arthritis   . Asthma   . 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 Cancer ([place])   . Complication of anesthesia   . Depression   . Family history of adverse reaction to anesthesia    most of family - PONV  . Fibromyalgia   . Glaucoma   . Headache    migraines - 1-2x/mo  . HTN (hypertension) 03/09/2007  . Hyperlipidemia LDL goal <70 03/25/1993  . Hypertension   . Motion sickness    all moving vehicles  . PONV (postoperative nausea and vomiting)   . Thyroid disease     Past Surgical History:  Procedure Laterality Date  . ABDOMINAL HYSTERECTOMY    . APPENDECTOMY    . BLADDER SURGERY    . CATARACT EXTRACTION W/ INTRAOCULAR LENS  IMPLANT, BILATERAL    . COLONOSCOPY WITH PROPOFOL N/A 01/17/2016   Procedure: COLONOSCOPY WITH PROPOFOL;  Surgeon: [name], MD;  Location: [facility];  Service: Endoscopy;  Laterality: N/A;  . CORONARY ARTERY BYPASS GRAFT N/A 02/20/2017   Procedure: CORONARY ARTERY BYPASS GRAFTING (CABG) x , three using left internal mammary artery to left anterior descending coronary artery and right greater saphenous vein harvested  endoscopically to distal right and diagonal coronary arteries.;  Surgeon: Grace Isaac, MD;  Location: [facility];  Service: Open Heart Surgery;  Laterality: N/A;  . LEFT HEART CATH AND CORONARY ANGIOGRAPHY N/A 02/20/2017   Procedure: LEFT HEART CATH AND CORONARY ANGIOGRAPHY;  Surgeon: Isaias Cowman, MD;  Location: [facility];  Service: Cardiovascular;  Laterality: N/A;  . LEFT HEART CATH AND CORS/GRAFTS ANGIOGRAPHY N/A 05/24/2017   Procedure: LEFT HEART CATH AND CORS/GRAFTS ANGIOGRAPHY;  Surgeon: Sherren Mocha, MD;  Location: [facility];  Service: Cardiovascular;  Laterality: N/A;  . NASAL SINUS SURGERY    . POLYPECTOMY  01/17/2016   Procedure: POLYPECTOMY;  Surgeon: [name], MD;  Location: [facility];  Service: Endoscopy;;  . RIGHT OOPHORECTOMY    . TEE WITHOUT CARDIOVERSION N/A 02/20/2017   Procedure: TRANSESOPHAGEAL ECHOCARDIOGRAM (TEE);  Surgeon: Grace Isaac, MD;  Location: [facility];  Service: Open Heart Surgery;  Laterality: N/A;    There were no vitals filed for this visit.  Subjective Assessment - 10/13/17 1830    Subjective  Patient reports she continues with right hip pain and that the therapy seems to be helping to control her pain. She is exercising as instructed at home.     Pertinent History  long  history of back pain with arthritis with episodes of exacerbation for at least the past 10 years. Previous therapy for back pain with good results for pain control. Recently had MI with CABG x 3 02/20/2017 and she is recuperating from this and has been more sedentary.     Limitations  Sitting;Lifting;Standing;Walking;House hold activities;Other (comment) personal care, getting in/out of car    How long can you sit comfortably?  < 20 min.    How long can you stand comfortably?  unable to tolerated standing at all in one position    How long can you walk comfortably?  walking is painful and she has to exercise due to recent MI    Diagnostic tests  X  rays:     Patient Stated Goals  to get back to do things in moderation with less pain, be able to walk, sit, drive comfortably    Currently in Pain?  Yes    Pain Score  4     Pain Location  Back    Pain Orientation  Right;Left;Lower;Mid    Pain Descriptors / Indicators  Aching;Spasm    Pain Type  Chronic pain    Pain Onset  More than a month ago    Pain Frequency  Constant         Objective: Gait: mildly antalgic, slow cadence Transfers on/off massage chair with cautious movement, hesitation with reports of pain in lower lumbar region; transfers sit to stand with cautious, slow movement with reported pain in lower back and LE's/hips Palpation: spasm, increased tenderness with moderate spasms mid back to lumbar spine, right>left and tender along bilateral gluteal muscles  Treatment: Manual therapy:3min; goal; improve pain, soft tissue elasticity Patient seated in massage chair: STM performed with superficial and deep techniques to thoracic to lumbar spine  Modalities: US/estim combination x84min: 1MHz /pulsed 50% @ 1.2w/cm2/highvolt130volts : patient seatedin massage chair: performed treatment to rightand leftlower lumbar paraspinal muscle and gluteus medius region goal: pain, spasms; no adverse reactions noted  Patient response to treatment: improved soft tissue elasticity with decreased tenderness by up to 50% and verbalized good understanding of HEP for stretching/ROM of right hip rotations to improved flexibility and pain control                PT Education - 10/13/17 1915    Education provided  Yes    Education Details  HEP: hip rotations and flexion in standing and sitting and supine lying    Person(s) Educated  Patient    Methods  Explanation    Comprehension  Verbalized understanding          PT Long Term Goals - 10/11/17 1927      PT LONG TERM GOAL #1   Title  improve FOTO to 30/100 demonstrating improved function with daily tasks     Baseline  FOTO 22/100: 10/11/17 46/100    Status  Achieved      PT LONG TERM GOAL #2   Title  improve FOTO to 40/100 demonstrating improved function with daily tasks    Baseline  FOTO 22/100; FOTO 46/100    Status  Achieved      PT LONG TERM GOAL #3   Title  patient will be independent with home program for pain control, exercises in order to transition to self management once discharged from physical therapy    Baseline  limited knowledge of appropriate pain control, exercise progression without guidance, cuing    Status  On-going    Target  Date  11/23/17      PT LONG TERM GOAL #4   Title  improve FOTO to 50/100 demonstrating improved function with daily tasks    Baseline  FOTO 46/100    Status  New    Target Date  11/23/17            Plan - 10/13/17 1928    Clinical Impression Statement  Patient demonstrated improved soft tissue elasticity and decreased tenderness in lumbar region and thoracic region following treatment. She continues with difficulty with rising from sit and walking due to right hip pain. She will work on stretching right hip rotations and flexion and will monitor pain.     Rehab Potential  Fair    Clinical Impairments Affecting Rehab Potential  (+)motivated, acute on chronic pain, active lifestyle prior to this episode(-)chronic condition, pain, recent CABG x 3 02/20/2017, arthritis, HTN    PT Frequency  1x / week    PT Duration  6 weeks    PT Treatment/Interventions  Cryotherapy;Electrical Stimulation;Moist Heat;Ultrasound;Therapeutic exercise;Therapeutic activities;Neuromuscular re-education;Patient/family education;Manual techniques    PT Next Visit Plan  pain control, exercise progression as needed; electrical stimulation, moist heat    PT Home Exercise Plan  pain cotnrol, body mechanics, use of lumbar support for sitting, sleeping       Patient will benefit from skilled therapeutic intervention in order to improve the following deficits and impairments:   Pain, Improper body mechanics, Increased muscle spasms, Decreased activity tolerance, Decreased endurance, Decreased range of motion, Decreased strength, Impaired perceived functional ability  Visit Diagnosis: Pain in right hip  Low back pain with sciatica, sciatica laterality unspecified, unspecified back pain laterality, unspecified chronicity  Muscle weakness (generalized)  Difficulty in walking, not elsewhere classified     Problem List Patient Active Problem List   Diagnosis Date Noted  . Abnormal nuclear stress test 05/24/2017  . 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 PAF (paroxysmal atrial fibrillation) ([place])   . Non-STEMI (non-ST elevated myocardial infarction) ([place]) 02/20/2017  . 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 Moderate mitral insufficiency 01/11/2017  . Personal history of colonic polyps   . Benign neoplasm of ascending colon   . Benign neoplasm of descending colon   . Chest pain, moderate coronary artery risk 09/16/2015  . Disequilibrium 09/16/2015  . Panic attacks 09/11/2015  . Ganglion cyst 08/16/2015  . Hypernatremia 07/04/2015  . Upper back pain 07/04/2015  . Other osteoarthritis involving multiple joints 02/06/2015  . Clinical depression 09/27/2014  . Dry mouth 09/27/2014  . Fibrositis 09/27/2014  . LBP (low back pain) 09/27/2014  . Avitaminosis D 09/27/2014  . Decreased leukocytes 09/27/2014  . Abnormal blood sugar 05/03/2009  . Insomnia 09/29/2007  . HTN (hypertension) 03/09/2007  . Adaptation reaction 01/29/2007  . Acid reflux 10/07/2006  . Menopausal and postmenopausal disorder 10/05/2006  . Headache, migraine 10/05/2006  . Arthritis, degenerative 03/23/1994  . Adult hypothyroidism 04/15/1993  . Hyperlipidemia LDL goal <70 03/25/1993    Jomarie Longs PT 10/14/2017, 7:32 PM  [facility] [address] [place], [address] Phone:  [phone]   Fax:  [phone]  Name: Stacey [name] MRN: [id number] Date of Birth: October 28, 1940

## 2017-10-20 ENCOUNTER — Encounter: Payer: Self-pay | Admitting: Physical Therapy

## 2017-10-20 ENCOUNTER — Ambulatory Visit: Payer: Medicare Other | Admitting: Physical Therapy

## 2017-10-20 DIAGNOSIS — M5440 Lumbago with sciatica, unspecified side: Secondary | ICD-10-CM

## 2017-10-20 DIAGNOSIS — R262 Difficulty in walking, not elsewhere classified: Secondary | ICD-10-CM

## 2017-10-20 DIAGNOSIS — M25551 Pain in right hip: Secondary | ICD-10-CM

## 2017-10-20 DIAGNOSIS — M6281 Muscle weakness (generalized): Secondary | ICD-10-CM

## 2017-10-21 NOTE — Therapy (Signed)
[facility] [address], [place], [address] Phone: [phone]   Fax:  [phone]  Physical Therapy Treatment  Patient Details  Name: Stacey [name] MRN: [id number] Date of Birth: 07/24/1940 Referring Provider: [name] PA-C   Encounter Date: 10/20/2017  PT End of Session - 10/20/17 1932    Visit Number  15    Number of Visits  24    Date for PT Re-Evaluation  11/22/17    Authorization Type  15 of  19 FOTO    Authorization Time Period  5/10 progress report    PT Start Time  1835    PT Stop Time  1920    PT Time Calculation (min)  45 min    Activity Tolerance  Patient tolerated treatment well    Behavior During Therapy  [facility] for tasks assessed/performed       Past Medical History:  Diagnosis Date  . Abnormal nuclear stress test 05/24/2017  . Arthritis   . Asthma   . 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 Cancer ([place])   . Complication of anesthesia   . Depression   . Family history of adverse reaction to anesthesia    most of family - PONV  . Fibromyalgia   . Glaucoma   . Headache    migraines - 1-2x/mo  . HTN (hypertension) 03/09/2007  . Hyperlipidemia LDL goal <70 03/25/1993  . Hypertension   . Motion sickness    all moving vehicles  . PONV (postoperative nausea and vomiting)   . Thyroid disease     Past Surgical History:  Procedure Laterality Date  . ABDOMINAL HYSTERECTOMY    . APPENDECTOMY    . BLADDER SURGERY    . CATARACT EXTRACTION W/ INTRAOCULAR LENS  IMPLANT, BILATERAL    . COLONOSCOPY WITH PROPOFOL N/A 01/17/2016   Procedure: COLONOSCOPY WITH PROPOFOL;  Surgeon: [name], MD;  Location: [facility];  Service: Endoscopy;  Laterality: N/A;  . CORONARY ARTERY BYPASS GRAFT N/A 02/20/2017   Procedure: CORONARY ARTERY BYPASS GRAFTING (CABG) x , three using left internal mammary artery to left anterior descending coronary  artery and right greater saphenous vein harvested endoscopically to distal right and diagonal coronary arteries.;  Surgeon: Grace Isaac, MD;  Location: [facility];  Service: Open Heart Surgery;  Laterality: N/A;  . LEFT HEART CATH AND CORONARY ANGIOGRAPHY N/A 02/20/2017   Procedure: LEFT HEART CATH AND CORONARY ANGIOGRAPHY;  Surgeon: Isaias Cowman, MD;  Location: [facility];  Service: Cardiovascular;  Laterality: N/A;  . LEFT HEART CATH AND CORS/GRAFTS ANGIOGRAPHY N/A 05/24/2017   Procedure: LEFT HEART CATH AND CORS/GRAFTS ANGIOGRAPHY;  Surgeon: Sherren Mocha, MD;  Location: [facility];  Service: Cardiovascular;  Laterality: N/A;  . NASAL SINUS SURGERY    . POLYPECTOMY  01/17/2016   Procedure: POLYPECTOMY;  Surgeon: [name], MD;  Location: [facility];  Service: Endoscopy;;  . RIGHT OOPHORECTOMY    . TEE WITHOUT CARDIOVERSION N/A 02/20/2017   Procedure: TRANSESOPHAGEAL ECHOCARDIOGRAM (TEE);  Surgeon: Grace Isaac, MD;  Location: [facility];  Service: Open Heart Surgery;  Laterality: N/A;    There were no vitals filed for this visit.  Subjective Assessment - 10/20/17 1849    Subjective  Patient reports she is still having intermittent pain in her back and right hip    Pertinent History  long history of back pain  with arthritis with episodes of exacerbation for at least the past 10 years. Previous therapy for back pain with good results for pain control. Recently had MI with CABG x 3 02/20/2017 and she is recuperating from this and has been more sedentary.     Limitations  Sitting;Lifting;Standing;Walking;House hold activities;Other (comment) personal care, getting in/out of car    How long can you sit comfortably?  < 20 min.    How long can you stand comfortably?  unable to tolerated standing at all in one position    How long can you walk comfortably?  walking is painful and she has to exercise due to recent MI    Diagnostic tests  X rays:     Patient  Stated Goals  to get back to do things in moderation with less pain, be able to walk, sit, drive comfortably    Currently in Pain?  Yes    Pain Score  4     Pain Location  Back    Pain Orientation  Right;Left;Mid;Lower    Pain Descriptors / Indicators  Aching;Spasm    Pain Type  Chronic pain    Pain Onset  More than a month ago    Pain Frequency  Constant         Objective: Gait: mildly antalgic, decreased trunk rotation Transfers on/off massage chair with cautious movement, hesitation with reports of pain in lower lumbar region Palpation: spasm, increased tenderness with moderate spasms mid back to lumbar spine, right>left and tender along bilateralgluteal muscles  Treatment: Manual therapy:8min; goal; improve pain, soft tissue elasticity Patient seated in massage chair: STM performed with superficial and deep techniques to thoracic to lumbar spine  Modalities: US/estim combination x71min: 1MHz /pulsed 50% @ 1.2w/cm2/highvolt130volts : patient seatedin massage chair: performed treatment to rightand leftlower lumbar paraspinal muscle and gluteus medius region and right mid thoracic spine to spasms goal: pain, spasms; no adverse reactions noted  Patient response to treatment:improved soft tissue elasticity and decreased spasms by 50% following treatment.     PT Education - 10/20/17 1929    Education provided  Yes    Education Details  re assessed pain control strategies, posture, exercises     Person(s) Educated  Patient    Methods  Explanation    Comprehension  Verbalized understanding          PT Long Term Goals - 10/11/17 1927      PT LONG TERM GOAL #1   Title  improve FOTO to 30/100 demonstrating improved function with daily tasks    Baseline  FOTO 22/100: 10/11/17 46/100    Status  Achieved      PT LONG TERM GOAL #2   Title  improve FOTO to 40/100 demonstrating improved function with daily tasks    Baseline  FOTO 22/100; FOTO 46/100    Status   Achieved      PT LONG TERM GOAL #3   Title  patient will be independent with home program for pain control, exercises in order to transition to self management once discharged from physical therapy    Baseline  limited knowledge of appropriate pain control, exercise progression without guidance, cuing    Status  On-going    Target Date  11/23/17      PT LONG TERM GOAL #4   Title  improve FOTO to 50/100 demonstrating improved function with daily tasks    Baseline  FOTO 46/100    Status  New    Target Date  11/23/17  Plan - 10/20/17 1933    Clinical Impression Statement  Patient demonstrated decreased spasms in right mid thoracic spine and bilateral lumbar spine with treatment. She continues with pain in lumbar spine that limits full function with daily activities and she will beneift from continued physical therapy intervention to further decrease symptoms for improved function.    Rehab Potential  Fair    Clinical Impairments Affecting Rehab Potential  (+)motivated, acute on chronic pain, active lifestyle prior to this episode(-)chronic condition, pain, recent CABG x 3 02/20/2017, arthritis, HTN    PT Frequency  1x / week    PT Duration  6 weeks    PT Treatment/Interventions  Cryotherapy;Electrical Stimulation;Moist Heat;Ultrasound;Therapeutic exercise;Therapeutic activities;Neuromuscular re-education;Patient/family education;Manual techniques    PT Next Visit Plan  pain control, exercise progression as needed; electrical stimulation, moist heat    PT Home Exercise Plan  pain cotnrol, body mechanics, use of lumbar support for sitting, sleeping       Patient will benefit from skilled therapeutic intervention in order to improve the following deficits and impairments:  Pain, Improper body mechanics, Increased muscle spasms, Decreased activity tolerance, Decreased endurance, Decreased range of motion, Decreased strength, Impaired perceived functional ability  Visit  Diagnosis: Pain in right hip  Low back pain with sciatica, sciatica laterality unspecified, unspecified back pain laterality, unspecified chronicity  Muscle weakness (generalized)  Difficulty in walking, not elsewhere classified     Problem List Patient Active Problem List   Diagnosis Date Noted  . Abnormal nuclear stress test 05/24/2017  . 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 PAF (paroxysmal atrial fibrillation) ([place])   . Non-STEMI (non-ST elevated myocardial infarction) ([place]) 02/20/2017  . 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 Moderate mitral insufficiency 01/11/2017  . Personal history of colonic polyps   . Benign neoplasm of ascending colon   . Benign neoplasm of descending colon   . Chest pain, moderate coronary artery risk 09/16/2015  . Disequilibrium 09/16/2015  . Panic attacks 09/11/2015  . Ganglion cyst 08/16/2015  . Hypernatremia 07/04/2015  . Upper back pain 07/04/2015  . Other osteoarthritis involving multiple joints 02/06/2015  . Clinical depression 09/27/2014  . Dry mouth 09/27/2014  . Fibrositis 09/27/2014  . LBP (low back pain) 09/27/2014  . Avitaminosis D 09/27/2014  . Decreased leukocytes 09/27/2014  . Abnormal blood sugar 05/03/2009  . Insomnia 09/29/2007  . HTN (hypertension) 03/09/2007  . Adaptation reaction 01/29/2007  . Acid reflux 10/07/2006  . Menopausal and postmenopausal disorder 10/05/2006  . Headache, migraine 10/05/2006  . Arthritis, degenerative 03/23/1994  . Adult hypothyroidism 04/15/1993  . Hyperlipidemia LDL goal <70 03/25/1993    Jomarie Longs PT 10/21/2017, 11:41 PM  [facility] [address] [place], [address] Phone: [phone]   Fax:  [phone]  Name: LITA FLYNN MRN: [id number] Date of Birth: May 31, 1940

## 2017-10-22 ENCOUNTER — Encounter: Payer: Self-pay | Admitting: Emergency Medicine

## 2017-10-22 ENCOUNTER — Other Ambulatory Visit: Payer: Self-pay

## 2017-10-22 ENCOUNTER — Inpatient Hospital Stay
Admission: EM | Admit: 2017-10-22 | Discharge: 2017-10-24 | DRG: 066 | Disposition: A | Discharge status: Home / Self Care | Payer: Medicare Other | Attending: Internal Medicine | Admitting: Internal Medicine

## 2017-10-22 ENCOUNTER — Inpatient Hospital Stay: Payer: Medicare Other

## 2017-10-22 ENCOUNTER — Emergency Department: Payer: Medicare Other

## 2017-10-22 DIAGNOSIS — Z87891 Personal history of nicotine dependence: Secondary | ICD-10-CM | POA: Diagnosis not present

## 2017-10-22 DIAGNOSIS — H5347 Heteronymous bilateral field defects: Secondary | ICD-10-CM | POA: Diagnosis present

## 2017-10-22 DIAGNOSIS — I2510 Atherosclerotic heart disease of native coronary artery without angina pectoris: Secondary | ICD-10-CM | POA: Diagnosis present

## 2017-10-22 DIAGNOSIS — Z7951 Long term (current) use of inhaled steroids: Secondary | ICD-10-CM | POA: Diagnosis not present

## 2017-10-22 DIAGNOSIS — H409 Unspecified glaucoma: Secondary | ICD-10-CM | POA: Diagnosis present

## 2017-10-22 DIAGNOSIS — F329 Major depressive disorder, single episode, unspecified: Secondary | ICD-10-CM | POA: Diagnosis present

## 2017-10-22 DIAGNOSIS — R059 Cough, unspecified: Secondary | ICD-10-CM

## 2017-10-22 DIAGNOSIS — J45909 Unspecified asthma, uncomplicated: Secondary | ICD-10-CM | POA: Diagnosis present

## 2017-10-22 DIAGNOSIS — E039 Hypothyroidism, unspecified: Secondary | ICD-10-CM | POA: Diagnosis present

## 2017-10-22 DIAGNOSIS — R05 Cough: Secondary | ICD-10-CM | POA: Diagnosis not present

## 2017-10-22 DIAGNOSIS — Z7982 Long term (current) use of aspirin: Secondary | ICD-10-CM | POA: Diagnosis not present

## 2017-10-22 DIAGNOSIS — Z79899 Other long term (current) drug therapy: Secondary | ICD-10-CM | POA: Diagnosis not present

## 2017-10-22 DIAGNOSIS — Z951 Presence of aortocoronary bypass graft: Secondary | ICD-10-CM | POA: Diagnosis not present

## 2017-10-22 DIAGNOSIS — I4891 Unspecified atrial fibrillation: Secondary | ICD-10-CM | POA: Diagnosis present

## 2017-10-22 DIAGNOSIS — M797 Fibromyalgia: Secondary | ICD-10-CM | POA: Diagnosis not present

## 2017-10-22 DIAGNOSIS — I6359 Cerebral infarction due to unspecified occlusion or stenosis of other cerebral artery: Secondary | ICD-10-CM | POA: Diagnosis not present

## 2017-10-22 DIAGNOSIS — H53469 Homonymous bilateral field defects, unspecified side: Secondary | ICD-10-CM | POA: Diagnosis not present

## 2017-10-22 DIAGNOSIS — I639 Cerebral infarction, unspecified: Secondary | ICD-10-CM | POA: Diagnosis present

## 2017-10-22 DIAGNOSIS — I6340 Cerebral infarction due to embolism of unspecified cerebral artery: Secondary | ICD-10-CM | POA: Diagnosis not present

## 2017-10-22 DIAGNOSIS — E782 Mixed hyperlipidemia: Secondary | ICD-10-CM | POA: Diagnosis present

## 2017-10-22 DIAGNOSIS — I480 Paroxysmal atrial fibrillation: Secondary | ICD-10-CM | POA: Diagnosis not present

## 2017-10-22 DIAGNOSIS — I10 Essential (primary) hypertension: Secondary | ICD-10-CM | POA: Diagnosis not present

## 2017-10-22 DIAGNOSIS — Z8679 Personal history of other diseases of the circulatory system: Secondary | ICD-10-CM | POA: Diagnosis not present

## 2017-10-22 DIAGNOSIS — I25810 Atherosclerosis of coronary artery bypass graft(s) without angina pectoris: Secondary | ICD-10-CM | POA: Diagnosis not present

## 2017-10-22 DIAGNOSIS — R29702 NIHSS score 2: Secondary | ICD-10-CM | POA: Diagnosis present

## 2017-10-22 HISTORY — DX: Unspecified atrial fibrillation: I48.91

## 2017-10-22 LAB — COMPREHENSIVE METABOLIC PANEL
ALK PHOS: 81 U/L (ref 38–126)
ALT: 20 U/L (ref 14–54)
ANION GAP: 11 (ref 5–15)
AST: 27 U/L (ref 15–41)
Albumin: 4.4 g/dL (ref 3.5–5.0)
BILIRUBIN TOTAL: 0.8 mg/dL (ref 0.3–1.2)
BUN: 18 mg/dL (ref 6–20)
CALCIUM: 9.1 mg/dL (ref 8.9–10.3)
CO2: 26 mmol/L (ref 22–32)
CREATININE: 0.87 mg/dL (ref 0.44–1.00)
Chloride: 98 mmol/L — ABNORMAL LOW (ref 101–111)
GFR calc Af Amer: >60 mL/min (ref >60)
GFR calc non Af Amer: >60 mL/min (ref >60)
GLUCOSE: 118 mg/dL — AB (ref 65–99)
Potassium: 4 mmol/L (ref 3.5–5.1)
Sodium: 135 mmol/L (ref 135–145)
TOTAL PROTEIN: 7.4 g/dL (ref 6.5–8.1)

## 2017-10-22 LAB — DIFFERENTIAL
Basophils Absolute: 0.1 10*3/uL (ref 0–0.1)
Basophils Relative: 1 %
EOS PCT: 1 %
Eosinophils Absolute: 0.1 10*3/uL (ref 0–0.7)
LYMPHS ABS: 1 10*3/uL (ref 1.0–3.6)
Lymphocytes Relative: 13 %
MONO ABS: 0.5 10*3/uL (ref 0.2–0.9)
Monocytes Relative: 6 %
Neutro Abs: 6.1 10*3/uL (ref 1.4–6.5)
Neutrophils Relative %: 79 %

## 2017-10-22 LAB — CBC
HCT: 42.9 % (ref 35.0–47.0)
HEMOGLOBIN: 14.3 g/dL (ref 12.0–16.0)
MCH: 28.8 pg (ref 26.0–34.0)
MCHC: 33.3 g/dL (ref 32.0–36.0)
MCV: 86.4 fL (ref 80.0–100.0)
Platelets: 155 10*3/uL (ref 150–440)
RBC: 4.96 MIL/uL (ref 3.80–5.20)
RDW: 13.6 % (ref 11.5–14.5)
WBC: 7.7 10*3/uL (ref 3.6–11.0)

## 2017-10-22 LAB — PROTIME-INR
INR: 1.03
Prothrombin Time: 13.4 seconds (ref 11.4–15.2)

## 2017-10-22 LAB — GLUCOSE, CAPILLARY: Glucose-Capillary: 109 mg/dL — ABNORMAL HIGH (ref 65–99)

## 2017-10-22 LAB — TROPONIN I: Troponin I: <0.03 ng/mL (ref <0.03)

## 2017-10-22 LAB — ETHANOL

## 2017-10-22 LAB — APTT: aPTT: 33 seconds (ref 24–36)

## 2017-10-22 MED ORDER — ZOLPIDEM TARTRATE 5 MG PO TABS
5.0000 mg | ORAL_TABLET | Freq: Every evening | ORAL | Status: DC | PRN
  Administered 2017-10-23: 5 mg via ORAL
  Filled 2017-10-22: qty 1

## 2017-10-22 MED ORDER — METOPROLOL TARTRATE 25 MG PO TABS
25.0000 mg | ORAL_TABLET | Freq: Two times a day (BID) | ORAL | Status: DC
  Administered 2017-10-22 – 2017-10-24 (×4): 25 mg via ORAL
  Filled 2017-10-22: qty 1
  Filled 2017-10-22: qty 0.5
  Filled 2017-10-22 (×3): qty 1

## 2017-10-22 MED ORDER — ASPIRIN 81 MG PO CHEW
324.0000 mg | CHEWABLE_TABLET | Freq: Once | ORAL | Status: AC
  Administered 2017-10-22: 324 mg via ORAL
  Filled 2017-10-22: qty 4

## 2017-10-22 MED ORDER — LEVOTHYROXINE SODIUM 100 MCG PO TABS
100.0000 ug | ORAL_TABLET | Freq: Every day | ORAL | Status: DC
  Administered 2017-10-23 – 2017-10-24 (×2): 100 ug via ORAL
  Filled 2017-10-22 (×3): qty 1

## 2017-10-22 MED ORDER — ACETAMINOPHEN 650 MG RE SUPP: 650.0000 mg | RECTAL | Status: DC | PRN

## 2017-10-22 MED ORDER — ACETAMINOPHEN 160 MG/5ML PO SOLN
650.0000 mg | ORAL | Status: DC | PRN
  Filled 2017-10-22: qty 20.3

## 2017-10-22 MED ORDER — VITAMIN D 1000 UNITS PO TABS
2000.0000 [IU] | ORAL_TABLET | Freq: Every day | ORAL | Status: DC
Start: 2017-10-23 — End: 2017-10-24
  Administered 2017-10-23 – 2017-10-24 (×2): 2000 [IU] via ORAL
  Filled 2017-10-22 (×2): qty 2

## 2017-10-22 MED ORDER — ASPIRIN EC 325 MG PO TBEC
325.0000 mg | DELAYED_RELEASE_TABLET | Freq: Every day | ORAL | Status: DC
  Administered 2017-10-23 – 2017-10-24 (×2): 325 mg via ORAL
  Filled 2017-10-22 (×3): qty 1

## 2017-10-22 MED ORDER — ENOXAPARIN SODIUM 40 MG/0.4ML ~~LOC~~ SOLN
SUBCUTANEOUS | Status: AC
  Administered 2017-10-22: 17:00:00
  Filled 2017-10-22: qty 0.4

## 2017-10-22 MED ORDER — STROKE: EARLY STAGES OF RECOVERY BOOK
Freq: Once | Status: AC
  Administered 2017-10-22: 21:00:00

## 2017-10-22 MED ORDER — TRAMADOL HCL 50 MG PO TABS: 50.0000 mg | ORAL_TABLET | Freq: Four times a day (QID) | ORAL | Status: DC | PRN

## 2017-10-22 MED ORDER — BUPROPION HCL ER (XL) 300 MG PO TB24
300.0000 mg | ORAL_TABLET | Freq: Every day | ORAL | Status: DC
  Administered 2017-10-23: 300 mg via ORAL
  Filled 2017-10-22 (×3): qty 1

## 2017-10-22 MED ORDER — LORAZEPAM 2 MG/ML IJ SOLN
1.0000 mg | Freq: Every day | INTRAMUSCULAR | Status: DC | PRN
  Administered 2017-10-23: 1 mg via INTRAVENOUS
  Filled 2017-10-22: qty 1

## 2017-10-22 MED ORDER — ALBUTEROL SULFATE (2.5 MG/3ML) 0.083% IN NEBU: 3.0000 mL | INHALATION_SOLUTION | Freq: Four times a day (QID) | RESPIRATORY_TRACT | Status: DC | PRN

## 2017-10-22 MED ORDER — ROSUVASTATIN CALCIUM 20 MG PO TABS
20.0000 mg | ORAL_TABLET | Freq: Every day | ORAL | Status: DC
  Administered 2017-10-23: 18:00:00 20 mg via ORAL
  Filled 2017-10-22 (×2): qty 1

## 2017-10-22 MED ORDER — ACETAMINOPHEN 325 MG PO TABS
650.0000 mg | ORAL_TABLET | ORAL | Status: DC | PRN
  Administered 2017-10-22: 19:00:00 650 mg via ORAL
  Filled 2017-10-22: qty 2

## 2017-10-22 MED ORDER — STROKE: EARLY STAGES OF RECOVERY BOOK
Freq: Once | Status: AC
  Administered 2017-10-22: 17:00:00

## 2017-10-22 MED ORDER — ENOXAPARIN SODIUM 40 MG/0.4ML ~~LOC~~ SOLN
40.0000 mg | SUBCUTANEOUS | Status: DC
  Administered 2017-10-22: 40 mg via SUBCUTANEOUS

## 2017-10-22 MED ORDER — AMLODIPINE BESYLATE 5 MG PO TABS
5.0000 mg | ORAL_TABLET | Freq: Every day | ORAL | Status: DC
  Administered 2017-10-23 – 2017-10-24 (×2): 5 mg via ORAL
  Filled 2017-10-22 (×2): qty 1

## 2017-10-22 MED ORDER — ADULT MULTIVITAMIN W/MINERALS CH
1.0000 | ORAL_TABLET | Freq: Every day | ORAL | Status: DC
  Administered 2017-10-23 – 2017-10-24 (×2): 1 via ORAL
  Filled 2017-10-22 (×2): qty 1

## 2017-10-22 MED ORDER — DIAZEPAM 5 MG PO TABS: 5.0000 mg | ORAL_TABLET | Freq: Two times a day (BID) | ORAL | Status: DC | PRN

## 2017-10-22 NOTE — ED Triage Notes (Signed)
Pt here for right sided visual field loss.  Per pt when clarified worse in left eye but present in both.  Started at 0945 per pt and got a little better but then worse again.  Came from eye doctor for CVA r/o after normal eye exam.  VAN negative.  Generalized weakness since yesterday per pt.    1447 eval by dr Corky Downs.     1449 arrived to CT scanner

## 2017-10-22 NOTE — Progress Notes (Signed)
CODE STROKE- PHARMACY COMMUNICATION   Time CODE STROKE called/page received:1448  Time response to CODE STROKE was made (in person or via phone): initial 1500; secondary 1515.   Time Stroke Kit retrieved from [facility] (only if needed):N/A  Name of Provider/Nurse contacted:Olivia   Past Medical History:  Diagnosis Date  . Abnormal nuclear stress test 05/24/2017  . Arthritis   . Asthma   . 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 Cancer ([place])   . Complication of anesthesia   . Depression   . Family history of adverse reaction to anesthesia    most of family - PONV  . Fibromyalgia   . Glaucoma   . Headache    migraines - 1-2x/mo  . HTN (hypertension) 03/09/2007  . Hyperlipidemia LDL goal <70 03/25/1993  . Hypertension   . Motion sickness    all moving vehicles  . PONV (postoperative nausea and vomiting)   . Thyroid disease    Prior to Admission medications   Medication Sig Start Date End Date Taking? Authorizing Provider  acetaminophen (TYLENOL) 325 MG tablet Take 2 tablets (650 mg total) by mouth every 6 (six) hours as needed for mild pain. 02/28/17   Nani Skillern, PA-C  albuterol (VENTOLIN HFA) 108 (90 Base) MCG/ACT inhaler INHALE 2 PUFFS BY MOUTH EVERY 4 TO 6 HOURS AS NEEDED FOR SHORTNESS OF BREATH 12/17/16   Mar Daring, PA-C  amLODipine (NORVASC) 5 MG tablet Take 1 tablet (5 mg total) daily by mouth. 04/04/17   [name], MD  aspirin 81 MG tablet ASPIRIN, 81MG (Oral Tablet Delayed Release)  1 Every Day for 0 days  Quantity: 0.00;  Refills: 0   Ordered :17-November-2010  [name] ;  Started 05-Oct-2006 Active Comments: DX: 401.9 10/05/06   [provider]  buPROPion (WELLBUTRIN XL) 300 MG 24 hr tablet Take 1 tablet (300 mg total) by mouth daily. 03/22/17   [name], MD  capsaicin (ZOSTRIX) 0.025 % cream Apply 1 application topically as needed (nerve pain in chest).    [provider]  celecoxib (CELEBREX) 200 MG capsule Take 1 capsule (200 mg total) by mouth daily. 06/01/17   [name], MD  Cholecalciferol (VITAMIN D) 2000 units tablet Take 2,000 Units by mouth daily.    [provider]  Cyanocobalamin (B-12) 500 MCG TABS Take by mouth.    [provider]  diazepam (VALIUM) 5 MG tablet Take 1 tablet (5 mg total) by mouth every 12 (twelve) hours as needed for anxiety. 08/30/17   [name], MD  EPINEPHrine (EPIPEN 2-PAK) 0.3 mg/0.3 mL IJ SOAJ injection INJECT AS DIRECTED FOR SEVERE ALLERGIC REACTION 12/17/16   Mar Daring, PA-C  levothyroxine (SYNTHROID, LEVOTHROID) 100 MCG tablet Take 1 tablet (100 mcg total) by mouth daily. 04/30/17   [name], MD  lisinopril (PRINIVIL,ZESTRIL) 40 MG tablet Take 1 tablet (40 mg total) by mouth daily. 04/27/17   [name], MD  metoprolol tartrate (LOPRESSOR) 25 MG tablet Take 1 tablet (25 mg total) by mouth 2 (two) times daily. 05/24/17   Barrett, Evelene Croon, PA-C  Multiple Vitamin (MULTI-VITAMINS) TABS Take 1 tablet by mouth daily.    [provider]  oxymetazoline (AFRIN) 0.05 % nasal spray Place 1 spray into both nostrils as needed for congestion.    [provider]  rosuvastatin (CRESTOR) 40 MG tablet Take 1  tablet (40 mg total) by mouth daily. 06/01/17   [name], MD  traMADol (ULTRAM) 50 MG tablet Take 1 tablet (50 mg total) by mouth every 6 (six) hours as needed for moderate pain. 02/28/17   Nani Skillern, PA-C  zolpidem (AMBIEN) 5 MG tablet Take 1 tablet (5 mg total) by mouth at bedtime as needed. for sleep 06/01/17   [name], MD    Charlett Nose ,PharmD Clinical Pharmacist  10/22/2017  3:30 PM

## 2017-10-22 NOTE — Progress Notes (Signed)
Patient ID: Stacey [name], female   DOB: 1941/04/21, 77 y.o.   MRN: [id number]  ACP note  Patient and husband at the bedside  Diagnosis acute stroke, history of coronary artery disease with recent bypass, postoperative atrial fibrillation, hypertension, hyperlipidemia, hypothyroidism, fibromyalgia CODE STATUS discussed.  Patient would like to be a full code. Plan.  MRI of the brain, carotid ultrasound and echocardiogram with bubble study.  Continue aspirin.  Appreciate neurology consultation.  Get physical therapy and occupational therapy consultations.  Time spent on ACP discussion 17 minutes Dr. Loletha Grayer

## 2017-10-22 NOTE — H&P (Signed)
[facility] [place] NAME: Stacey [name]    MR#:  [id number]  DATE OF BIRTH:  1940-10-22  DATE OF ADMISSION:  10/22/2017  PRIMARY CARE PHYSICIAN: [name], MD   REQUESTING/REFERRING PHYSICIAN: Dr Merlyn Lot  CHIEF COMPLAINT:   Chief Complaint  Patient presents with  . Loss of Vision    HISTORY OF PRESENT ILLNESS:  Stacey [name]  is a 77 y.o. female with a known history of recent CABG.  The patient woke up this morning and was doing okay.  Around 9:45 AM she said she had episodes with her vision where she was seeing like a kaleidoscope or prism.  She could not tell which eye it was happening but after the third time it happened she got concerned.  They went to [facility] and was seen by Dr. Benay Pillow.  He stated the eye itself looked okay but the patient may have had a stroke and sent the patient to the ER for further evaluation.  The patient also states that she has had a dry cough on and off.  She has some soreness from where they did the operation on the bypass.  She did have postoperative atrial fibrillation which was transient and was briefly on Eliquis but that has been discontinued.  The patient is currently on aspirin.  PAST MEDICAL HISTORY:   Past Medical History:  Diagnosis Date  . Abnormal nuclear stress test 05/24/2017  . Arthritis   . Asthma   . Atrial fibrillation, transient ([place])   . 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 Cancer ([place])   . Complication of anesthesia   . Depression   . Family history of adverse reaction to anesthesia    most of family - PONV  . Fibromyalgia   . Glaucoma   . Headache    migraines - 1-2x/mo  . HTN (hypertension) 03/09/2007  . Hyperlipidemia LDL goal <70 03/25/1993  . Hypertension   . Motion sickness    all moving vehicles  . PONV (postoperative nausea and vomiting)   . Thyroid disease     PAST  SURGICAL HISTORY:   Past Surgical History:  Procedure Laterality Date  . ABDOMINAL HYSTERECTOMY    . APPENDECTOMY    . BLADDER SURGERY    . CATARACT EXTRACTION W/ INTRAOCULAR LENS  IMPLANT, BILATERAL    . COLONOSCOPY WITH PROPOFOL N/A 01/17/2016   Procedure: COLONOSCOPY WITH PROPOFOL;  Surgeon: [name], MD;  Location: [facility];  Service: Endoscopy;  Laterality: N/A;  . CORONARY ARTERY BYPASS GRAFT N/A 02/20/2017   Procedure: CORONARY ARTERY BYPASS GRAFTING (CABG) x , three using left internal mammary artery to left anterior descending coronary artery and right greater saphenous vein harvested endoscopically to distal right and diagonal coronary arteries.;  Surgeon: Grace Isaac, MD;  Location: [facility];  Service: Open Heart Surgery;  Laterality: N/A;  . LEFT HEART CATH AND CORONARY ANGIOGRAPHY N/A 02/20/2017   Procedure: LEFT HEART CATH AND CORONARY ANGIOGRAPHY;  Surgeon: Isaias Cowman, MD;  Location: [facility];  Service: Cardiovascular;  Laterality: N/A;  . LEFT HEART CATH AND CORS/GRAFTS ANGIOGRAPHY N/A 05/24/2017   Procedure: LEFT HEART CATH AND CORS/GRAFTS ANGIOGRAPHY;  Surgeon: Sherren Mocha, MD;  Location: [facility];  Service: Cardiovascular;  Laterality: N/A;  . NASAL SINUS SURGERY    . POLYPECTOMY  01/17/2016   Procedure: POLYPECTOMY;  Surgeon:  [name], MD;  Location: [facility];  Service: Endoscopy;;  . RIGHT OOPHORECTOMY    . TEE WITHOUT CARDIOVERSION N/A 02/20/2017   Procedure: TRANSESOPHAGEAL ECHOCARDIOGRAM (TEE);  Surgeon: Grace Isaac, MD;  Location: [facility];  Service: Open Heart Surgery;  Laterality: N/A;    SOCIAL HISTORY:   Social History   Tobacco Use  . Smoking status: Former Smoker    Last attempt to quit: 05/26/1975    Years since quitting: 42.4  . Smokeless tobacco: Never Used  . Tobacco comment: quit 45  years ago   Substance Use Topics  . Alcohol use: No    FAMILY HISTORY:   Family History   Problem Relation Age of Onset  . Alzheimer's disease Mother   . Kidney cancer Mother   . Heart attack Father   . Pancreatic cancer Sister   . Heart attack Brother 1  . Ovarian cancer Sister   . Kidney disease Sister        dialysis  . Cancer Sister        uterin  . Diabetes Sister   . Heart attack Sister   . Heart attack Brother 45  . Ovarian cancer Sister     DRUG ALLERGIES:   Allergies  Allergen Reactions  . Cephalexin Anaphylaxis  . Atorvastatin   . Morphine And Related Hives  . Novocain [Procaine]     Chest pain  . Shellfish Allergy Swelling    angioedema  . Statins Other (See Comments)    Muscle pain  . Tomato     (by testing) - also beans, wheat, peas  . Penicillins Hives and Rash    REVIEW OF SYSTEMS:  CONSTITUTIONAL: No fever, chills or sweats.  History of fatigue.  No weight gain or weight loss. EYES: Positive for vision looking through a kaleidoscope for prism EARS, NOSE, AND THROAT: No tinnitus or ear pain. No sore throat dysphagia to liquids and solids.  RESPIRATORY:  dry cough.  No shortness of breath, wheezing or hemoptysis.  CARDIOVASCULAR: Positive for chest soreness.  No orthopnea, edema.  GASTROINTESTINAL: Positive for nausea.  No vomiting. No blood in bowel movements.  Yesterday had abdominal pain.  Occasional loose stools. GENITOURINARY: No dysuria, hematuria.  ENDOCRINE: No polyuria, nocturia,  HEMATOLOGY:  history of anemia.  SKIN: No rash or lesion. MUSCULOSKELETAL: Positive for joint pains NEUROLOGIC: No tingling, numbness, weakness.  PSYCHIATRY: No anxiety or depression.   MEDICATIONS AT HOME:   Prior to Admission medications   Medication Sig Start Date End Date Taking? Authorizing Provider  acetaminophen (TYLENOL) 325 MG tablet Take 2 tablets (650 mg total) by mouth every 6 (six) hours as needed for mild pain. 02/28/17  Yes Lars Pinks M, PA-C  albuterol (VENTOLIN HFA) 108 (90 Base) MCG/ACT inhaler INHALE 2 PUFFS BY MOUTH  EVERY 4 TO 6 HOURS AS NEEDED FOR SHORTNESS OF BREATH 12/17/16  Yes Mar Daring, PA-C  amLODipine (NORVASC) 5 MG tablet Take 1 tablet (5 mg total) daily by mouth. 04/04/17  Yes [name], MD  buPROPion (WELLBUTRIN XL) 300 MG 24 hr tablet Take 1 tablet (300 mg total) by mouth daily. 03/22/17  Yes [name], MD  diazepam (VALIUM) 5 MG tablet Take 1 tablet (5 mg total) by mouth every 12 (twelve) hours as needed for anxiety. Patient taking differently: Take 5 mg by mouth daily as needed for anxiety.  08/30/17  Yes [name], MD  EPINEPHrine (EPIPEN 2-PAK) 0.3 mg/0.3 mL  IJ SOAJ injection INJECT AS DIRECTED FOR SEVERE ALLERGIC REACTION 12/17/16  Yes Mar Daring, PA-C  aspirin 81 MG tablet ASPIRIN, 81MG  (Oral Tablet Delayed Release)  1 Every Day for 0 days  Quantity: 0.00;  Refills: 0   Ordered :17-November-2010  [name] ;  Started 05-Oct-2006 Active Comments: DX: 401.9 10/05/06   [provider]  capsaicin (ZOSTRIX) 0.025 % cream Apply 1 application topically as needed (nerve pain in chest).    [provider]  celecoxib (CELEBREX) 200 MG capsule Take 1 capsule (200 mg total) by mouth daily. 06/01/17   [name], MD  Cholecalciferol (VITAMIN D) 2000 units tablet Take 2,000 Units by mouth daily.    [provider]  Cyanocobalamin (B-12) 500 MCG TABS Take by mouth.    [provider]  levothyroxine (SYNTHROID, LEVOTHROID) 100 MCG tablet Take 1 tablet (100 mcg total) by mouth daily. 04/30/17   [name], MD  lisinopril (PRINIVIL,ZESTRIL) 40 MG tablet Take 1 tablet (40 mg total) by mouth daily. 04/27/17   [name], MD  metoprolol tartrate (LOPRESSOR) 25 MG tablet Take 1 tablet (25 mg total) by mouth 2 (two) times daily. 05/24/17   Barrett, Evelene Croon, PA-C  Multiple Vitamin (MULTI-VITAMINS) TABS Take 1 tablet by mouth daily.    [provider]  oxymetazoline (AFRIN) 0.05 % nasal  spray Place 1 spray into both nostrils as needed for congestion.    [provider]  rosuvastatin (CRESTOR) 40 MG tablet Take 1 tablet (40 mg total) by mouth daily. 06/01/17   [name], MD  traMADol (ULTRAM) 50 MG tablet Take 1 tablet (50 mg total) by mouth every 6 (six) hours as needed for moderate pain. 02/28/17   Nani Skillern, PA-C  zolpidem (AMBIEN) 5 MG tablet Take 1 tablet (5 mg total) by mouth at bedtime as needed. for sleep 06/01/17   [name], MD   Med rec is still going on  VITAL SIGNS:  Blood pressure (!) 152/87, pulse 72, temperature 98.2 F (36.8 C), temperature source Oral, resp. rate 16, height 5\' 2"  (1.575 m), weight 72.6 kg (160 lb), SpO2 98 %.  PHYSICAL EXAMINATION:  GENERAL:  77 y.o.-year-old patient lying in the bed with no acute distress.  EYES: Eyes dilated. No scleral icterus. Extraocular muscles intact.  HEENT: Head atraumatic, normocephalic. Oropharynx and nasopharynx clear.  NECK:  Supple, no jugular venous distention. No thyroid enlargement, no tenderness.  LUNGS: Decreased breath sounds bilateral bases, no wheezing, rales,rhonchi or crepitation. No use of accessory muscles of respiration.  CARDIOVASCULAR: S1, S2 normal. No murmurs, rubs, or gallops.  ABDOMEN: Soft, nontender, nondistended. Bowel sounds present. No organomegaly or mass.  EXTREMITIES: No pedal edema, cyanosis, or clubbing.  NEUROLOGIC: Cranial nerves II through XII are intact. Muscle strength 5/5 in all extremities. Sensation intact. Gait not checked.  PSYCHIATRIC: The patient is alert and oriented x 3.  SKIN: No rash, lesion, or ulcer.   LABORATORY PANEL:   CBC No results for input(s): WBC, HGB, HCT, PLT in the last 168 hours. ------------------------------------------------------------------------------------------------------------------  Chemistries  No results for input(s): NA, K, CL, CO2, GLUCOSE, BUN, CREATININE, CALCIUM, MG, AST, ALT, ALKPHOS,  BILITOT in the last 168 hours.  Invalid input(s): GFRCGP ------------------------------------------------------------------------------------------------------------------  Cardiac Enzymes No results for input(s): TROPONINI in the last 168 hours. ------------------------------------------------------------------------------------------------------------------  RADIOLOGY:  Ct Head Code Stroke Wo Contrast  Result Date: 10/22/2017 CLINICAL DATA:  Code stroke. Right-sided vision loss  since this morning EXAM: CT HEAD WITHOUT CONTRAST TECHNIQUE: Contiguous axial images were obtained from the base of the skull through the vertex without intravenous contrast. COMPARISON:  04/16/2014 FINDINGS: Brain: Small focus of cytotoxic type low density in right occipital lobe, just below the parieto-occipital sulcus. Anticipate MRI confirmation. Negative for hemorrhage. No hydrocephalus or masslike finding. Vascular: No high-density vessel.  Mild calcification. Skull: No acute finding Sinuses/Orbits: Bilateral cataract resection These results were called by telephone at the time of interpretation on 10/22/2017 at 3:02 pm to Dr. Harvest Dark , who verbally acknowledged these results. ASPECTS [facility] Stroke Program Early CT Score) Not scored with this history IMPRESSION: Small acute infarct in the right occipital lobe. Electronically Signed   By: Monte Fantasia M.D.   On: 10/22/2017 15:02    EKG:   Normal sinus rhythm 68 bpm Q waves anteriorly  IMPRESSION AND PLAN:   1.  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  Continue aspirin at this point as per neurology.  Monitor on telemetry for arrhythmia.  The patient did have postoperative atrial fibrillation but is currently in normal sinus rhythm.  Will likely need prolonged cardiac monitoring.  Obtain MRI of the brain with medication Ativan prior.  The patient is claustrophobic so I will hold off on MRA of the brain  at this time.   Obtain echocardiogram with bubble study, carotid ultrasound.  Patient already on Crestor. 2.  History of CAD on metoprolol Crestor and aspirin 3.  Cough.  Obtain chest x-ray PA and lateral.  May end up being secondary to lisinopril.  Will hold lisinopril for now. 4.  Essential hypertension.  Allow permissive hypertension hold lisinopril 5.  Fibromyalgia 6.  Hypothyroidism unspecified on levothyroxine 7.  Anxiety on Valium  All the records are reviewed and case discussed with ED provider. Management plans discussed with the patient, family and they are in agreement.  CODE STATUS: Full code  TOTAL TIME TAKING CARE OF THIS PATIENT: 50 minutes.    Loletha Grayer M.D on 10/22/2017 at 3:50 PM  Between 7am to 6pm - Pager - [phone]  After 6pm call admission pager [phone]  [facility]  [phone]  CC: Primary care physician; [name], MD

## 2017-10-22 NOTE — Progress Notes (Signed)
Chaplain met the patient following CT scan, while she was receiving care. Of concern, was locating her husband, [name], and bringing him to the ED. Chaplain located the husband and brought him to the patient's room. Further, Chaplain facilitated a conversation between the husband and care team concerning the patient's condition. Patient was emotional following diagnosis. Chaplain offered prayer and pastoral presence. The patient responded favorably.

## 2017-10-22 NOTE — Progress Notes (Signed)
Chaplain responded to OR for prayer and met with the patient and family. The patient was emotional and anxious about her stroke, upcoming MRI test (claustrophobic), and the fear of the unknown. Patient also described being "depressed" but is not taking a prescription. Chaplain actively listened and helped the patient work through some of her anxiety, reminding her not to judge her emotions. Through conversation, patient revealed a difficult childhood and how old memories come back when she is sick. Chaplain offered emotional and spiritual support, energetic prayer, and spoken prayer. Patient responded well to the conversation and prayer.

## 2017-10-22 NOTE — ED Notes (Signed)
Pt and husband updated with plan of care. Pt denies any change in symptoms from arrival. VSS.

## 2017-10-22 NOTE — ED Provider Notes (Signed)
[facility] Emergency Department Provider Note    First MD Initiated Contact with Patient 10/22/17 1458     (approximate)  I have reviewed the triage vital signs and the nursing notes.   HISTORY  Chief Complaint Loss of Vision    HPI Stacey [name] is a 77 y.o. female the history of recent CAD status post three-vessel CABG 8 months ago as well as a history of palpitations but no formal diagnosis of atrial fibrillation and is not on any anticoagulation other than a daily aspirin presents to the ER with chief complaint of  visual disturbance that she states initially started this morning.  She subsequently went to see her ophthalmologist.  I did a funduscopic exam and there were no irregularities but the patient was describing and is presenting with left-sided visual field cut deficit therefore was sent to the ER due to concern for occipital stroke.  Patient with no other symptoms at this time.  States that she has had intermittent palpitations for the past several weeks and states that she was feeling like her heart was racing last week.   Past Medical History:  Diagnosis Date  . Abnormal nuclear stress test 05/24/2017  . Arthritis   . Asthma   . Atrial fibrillation, transient ([place])   . 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 Cancer ([place])   . Complication of anesthesia   . Depression   . Family history of adverse reaction to anesthesia    most of family - PONV  . Fibromyalgia   . Glaucoma   . Headache    migraines - 1-2x/mo  . HTN (hypertension) 03/09/2007  . Hyperlipidemia LDL goal <70 03/25/1993  . Hypertension   . Motion sickness    all moving vehicles  . PONV (postoperative nausea and vomiting)   . Thyroid disease    Family History  Problem Relation Age of Onset  . Alzheimer's disease Mother   . Kidney cancer Mother   . Heart attack Father   . Pancreatic cancer Sister   . Heart attack Brother  50  . Ovarian cancer Sister   . Kidney disease Sister        dialysis  . Cancer Sister        uterin  . Diabetes Sister   . Heart attack Sister   . Heart attack Brother 43  . Ovarian cancer Sister    Past Surgical History:  Procedure Laterality Date  . ABDOMINAL HYSTERECTOMY    . APPENDECTOMY    . BLADDER SURGERY    . CATARACT EXTRACTION W/ INTRAOCULAR LENS  IMPLANT, BILATERAL    . COLONOSCOPY WITH PROPOFOL N/A 01/17/2016   Procedure: COLONOSCOPY WITH PROPOFOL;  Surgeon: [name], MD;  Location: [place];  Service: Endoscopy;  Laterality: N/A;  . CORONARY ARTERY BYPASS GRAFT N/A 02/20/2017   Procedure: CORONARY ARTERY BYPASS GRAFTING (CABG) x , three using left internal mammary artery to left anterior descending coronary artery and right greater saphenous vein harvested endoscopically to distal right and diagonal coronary arteries.;  Surgeon: Grace Isaac, MD;  Location: [place];  Service: Open Heart Surgery;  Laterality: N/A;  . LEFT HEART CATH AND CORONARY ANGIOGRAPHY N/A 02/20/2017   Procedure: LEFT HEART CATH AND CORONARY ANGIOGRAPHY;  Surgeon: Isaias Cowman, MD;  Location: [place] [facility];  Service: Cardiovascular;  Laterality: N/A;  . LEFT HEART CATH AND CORS/GRAFTS ANGIOGRAPHY N/A 05/24/2017  Procedure: LEFT HEART CATH AND CORS/GRAFTS ANGIOGRAPHY;  Surgeon: Sherren Mocha, MD;  Location: [place] [facility];  Service: Cardiovascular;  Laterality: N/A;  . NASAL SINUS SURGERY    . POLYPECTOMY  01/17/2016   Procedure: POLYPECTOMY;  Surgeon: [name], MD;  Location: [place];  Service: Endoscopy;;  . RIGHT OOPHORECTOMY    . TEE WITHOUT CARDIOVERSION N/A 02/20/2017   Procedure: TRANSESOPHAGEAL ECHOCARDIOGRAM (TEE);  Surgeon: Grace Isaac, MD;  Location: [place];  Service: Open Heart Surgery;  Laterality: N/A;   Patient Active Problem List   Diagnosis Date Noted  . CVA (cerebral vascular accident) ([place]) 10/22/2017  . Abnormal nuclear  stress test 05/24/2017  . 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 PAF (paroxysmal atrial fibrillation) ([place])   . Non-STEMI (non-ST elevated myocardial infarction) ([place]) 02/20/2017  . 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 Moderate mitral insufficiency 01/11/2017  . Personal history of colonic polyps   . Benign neoplasm of ascending colon   . Benign neoplasm of descending colon   . Chest pain, moderate coronary artery risk 09/16/2015  . Disequilibrium 09/16/2015  . Panic attacks 09/11/2015  . Ganglion cyst 08/16/2015  . Hypernatremia 07/04/2015  . Upper back pain 07/04/2015  . Other osteoarthritis involving multiple joints 02/06/2015  . Clinical depression 09/27/2014  . Dry mouth 09/27/2014  . Fibrositis 09/27/2014  . LBP (low back pain) 09/27/2014  . Avitaminosis D 09/27/2014  . Decreased leukocytes 09/27/2014  . Abnormal blood sugar 05/03/2009  . Insomnia 09/29/2007  . HTN (hypertension) 03/09/2007  . Adaptation reaction 01/29/2007  . Acid reflux 10/07/2006  . Menopausal and postmenopausal disorder 10/05/2006  . Headache, migraine 10/05/2006  . Arthritis, degenerative 03/23/1994  . Adult hypothyroidism 04/15/1993  . Hyperlipidemia LDL goal <70 03/25/1993      Prior to Admission medications   Medication Sig Start Date End Date Taking? Authorizing Provider  acetaminophen (TYLENOL) 325 MG tablet Take 2 tablets (650 mg total) by mouth every 6 (six) hours as needed for mild pain. 02/28/17  Yes Lars Pinks M, PA-C  albuterol (VENTOLIN HFA) 108 (90 Base) MCG/ACT inhaler INHALE 2 PUFFS BY MOUTH EVERY 4 TO 6 HOURS AS NEEDED FOR SHORTNESS OF BREATH 12/17/16  Yes Mar Daring, PA-C  amLODipine (NORVASC) 5 MG tablet Take 1 tablet (5 mg total) daily by mouth. 04/04/17  Yes [name], MD  aspirin 81 MG tablet ASPIRIN, 81MG  (Oral Tablet Delayed Release)  1 Every Day for 0 days  Quantity: 0.00;  Refills: 0   Ordered :17-November-2010   [name] ;  Started 05-Oct-2006 Active Comments: DX: 401.9 10/05/06  Yes [provider]  buPROPion (WELLBUTRIN XL) 300 MG 24 hr tablet Take 1 tablet (300 mg total) by mouth daily. 03/22/17  Yes [name], MD  capsaicin (ZOSTRIX) 0.025 % cream Apply 1 application topically as needed (nerve pain in chest).   Yes [provider]  Cholecalciferol (VITAMIN D) 2000 units tablet Take 2,000 Units by mouth daily.   Yes [provider]  Cyanocobalamin (B-12) 500 MCG TABS Take by mouth.   Yes [provider]  diazepam (VALIUM) 5 MG tablet Take 1 tablet (5 mg total) by mouth every 12 (twelve) hours as needed for anxiety. Patient taking differently: Take 5 mg by mouth daily as needed for anxiety.  08/30/17  Yes [name], MD  EPINEPHrine (EPIPEN 2-PAK) 0.3 mg/0.3 mL IJ SOAJ injection INJECT AS DIRECTED  FOR SEVERE ALLERGIC REACTION 12/17/16  Yes Mar Daring, PA-C  levothyroxine (SYNTHROID, LEVOTHROID) 100 MCG tablet Take 1 tablet (100 mcg total) by mouth daily. 04/30/17  Yes [name], MD  lisinopril (PRINIVIL,ZESTRIL) 40 MG tablet Take 1 tablet (40 mg total) by mouth daily. 04/27/17  Yes [name], MD  metoprolol tartrate (LOPRESSOR) 25 MG tablet Take 1 tablet (25 mg total) by mouth 2 (two) times daily. 05/24/17  Yes Barrett, Evelene Croon, PA-C  Multiple Vitamin (MULTI-VITAMINS) TABS Take 1 tablet by mouth daily.   Yes [provider]  oxymetazoline (AFRIN) 0.05 % nasal spray Place 1 spray into both nostrils as needed for congestion.   Yes [provider]  rosuvastatin (CRESTOR) 40 MG tablet Take 1 tablet (40 mg total) by mouth daily. Patient taking differently: Take 20 mg by mouth daily.  06/01/17  Yes [name], MD  traMADol (ULTRAM) 50 MG tablet Take 1 tablet (50 mg total) by mouth every 6 (six) hours as needed for moderate pain. 02/28/17  Yes Lars Pinks M, PA-C  zolpidem (AMBIEN) 5  MG tablet Take 1 tablet (5 mg total) by mouth at bedtime as needed. for sleep 06/01/17  Yes [name], MD  celecoxib (CELEBREX) 200 MG capsule Take 1 capsule (200 mg total) by mouth daily. Patient not taking: Reported on 10/22/2017 06/01/17   [name], MD    Allergies Cephalexin; Atorvastatin; Morphine and related; Novocain [procaine]; Shellfish allergy; Statins; Tomato; and Penicillins    Social History Social History   Tobacco Use  . Smoking status: Former Smoker    Last attempt to quit: 05/26/1975    Years since quitting: 42.4  . Smokeless tobacco: Never Used  . Tobacco comment: quit 45  years ago   Substance Use Topics  . Alcohol use: No  . Drug use: No    Review of Systems Patient denies headaches, rhinorrhea, blurry vision, numbness, shortness of breath, chest pain, edema, cough, abdominal pain, nausea, vomiting, diarrhea, dysuria, fevers, rashes or hallucinations unless otherwise stated above in HPI. ____________________________________________   PHYSICAL EXAM:  VITAL SIGNS: Vitals:   10/22/17 1457 10/22/17 1500  BP:  (!) 152/87  Pulse:    Resp:  16  Temp: 98.2 F (36.8 C)   SpO2:  98%    Constitutional: Alert and oriented.  Eyes: Conjunctivae are normal.  Head: Atraumatic. Nose: No congestion/rhinnorhea. Mouth/Throat: Mucous membranes are moist.   Neck: No stridor. Painless ROM.  Cardiovascular: Normal rate, regular rhythm. Grossly normal heart sounds.  Good peripheral circulation. Respiratory: Normal respiratory effort.  No retractions. Lungs CTAB. Gastrointestinal: Soft and nontender. No distention. No abdominal bruits. No CVA tenderness. Genitourinary:  Musculoskeletal: No lower extremity tenderness nor edema.  No joint effusions. Neurologic:  CN- intact.  No facial droop, Normal FNF.  Normal heel to shin.  Sensation intact bilaterally. Normal speech and language. No gross focal neurologic deficits are appreciated. No gait  instability. Skin:  Skin is warm, dry and intact. No rash noted. Psychiatric: Mood and affect are normal. Speech and behavior are normal.  ____________________________________________   LABS (all labs ordered are listed, but only abnormal results are displayed)  Results for orders placed or performed during the hospital encounter of 10/22/17 (from the past 24 hour(s))  Glucose, capillary     Status: Abnormal   Collection Time: 10/22/17  3:03 PM  Result Value Ref Range   Glucose-Capillary 109 (H) 65 - 99 mg/dL  Protime-INR  Status: None   Collection Time: 10/22/17  3:07 PM  Result Value Ref Range   Prothrombin Time 13.4 11.4 - 15.2 seconds   INR 1.03   APTT     Status: None   Collection Time: 10/22/17  3:07 PM  Result Value Ref Range   aPTT 33 24 - 36 seconds  CBC     Status: None   Collection Time: 10/22/17  3:07 PM  Result Value Ref Range   WBC 7.7 3.6 - 11.0 K/uL   RBC 4.96 3.80 - 5.20 MIL/uL   Hemoglobin 14.3 12.0 - 16.0 g/dL   HCT 42.9 35.0 - 47.0 %   MCV 86.4 80.0 - 100.0 fL   MCH 28.8 26.0 - 34.0 pg   MCHC 33.3 32.0 - 36.0 g/dL   RDW 13.6 11.5 - 14.5 %   Platelets 155 150 - 440 K/uL  Differential     Status: None   Collection Time: 10/22/17  3:07 PM  Result Value Ref Range   Neutrophils Relative % 79 %   Neutro Abs 6.1 1.4 - 6.5 K/uL   Lymphocytes Relative 13 %   Lymphs Abs 1.0 1.0 - 3.6 K/uL   Monocytes Relative 6 %   Monocytes Absolute 0.5 0.2 - 0.9 K/uL   Eosinophils Relative 1 %   Eosinophils Absolute 0.1 0 - 0.7 K/uL   Basophils Relative 1 %   Basophils Absolute 0.1 0 - 0.1 K/uL  Comprehensive metabolic panel     Status: Abnormal   Collection Time: 10/22/17  3:07 PM  Result Value Ref Range   Sodium 135 135 - 145 mmol/L   Potassium 4.0 3.5 - 5.1 mmol/L   Chloride 98 (L) 101 - 111 mmol/L   CO2 26 22 - 32 mmol/L   Glucose, Bld 118 (H) 65 - 99 mg/dL   BUN 18 6 - 20 mg/dL   Creatinine, Ser 0.87 0.44 - 1.00 mg/dL   Calcium 9.1 8.9 - 10.3 mg/dL    Total Protein 7.4 6.5 - 8.1 g/dL   Albumin 4.4 3.5 - 5.0 g/dL   AST 27 15 - 41 U/L   ALT 20 14 - 54 U/L   Alkaline Phosphatase 81 38 - 126 U/L   Total Bilirubin 0.8 0.3 - 1.2 mg/dL   GFR calc non Af Amer >60 >60 mL/min   GFR calc Af Amer >60 >60 mL/min   Anion gap 11 5 - 15  Troponin I     Status: None   Collection Time: 10/22/17  3:07 PM  Result Value Ref Range   Troponin I <0.03 <0.03 ng/mL  Ethanol     Status: None   Collection Time: 10/22/17  3:07 PM  Result Value Ref Range   Alcohol, Ethyl (B) <10 <10 mg/dL   ____________________________________________  EKG My review and personal interpretation at Time: 15:05   Indication: vision loss  Rate: 70  Rhythm: sinus Axis: normal Other: normal intervals, no stemi ____________________________________________  RADIOLOGY  I personally reviewed all radiographic images ordered to evaluate for the above acute complaints and reviewed radiology reports and findings.  These findings were personally discussed with the patient.  Please see medical record for radiology report.  ____________________________________________   PROCEDURES  Procedure(s) performed:  Procedures    Critical Care performed: no ____________________________________________   INITIAL IMPRESSION / ASSESSMENT AND PLAN / ED COURSE  Pertinent labs & imaging results that were available during my care of the patient were reviewed by me and considered in my  medical decision making (see chart for details).   DDX: cva, tia, hypoglycemia, dehydration, electrolyte abnormality, dissection, sepsis   Stacey [name] is a 77 y.o. who presents to the ED with evidence of acute occipital stroke given her presentation with symptoms including left homonymous hemianopsia.  Patient is outside of the TPA window.  Will give aspirin.  Patient will require hospitalization for further medical management.      As part of my medical decision making, I reviewed the following data  within the [facility] notes reviewed and incorporated, Labs reviewed, notes from prior ED visits.  ____________________________________________   FINAL CLINICAL IMPRESSION(S) / ED DIAGNOSES  Final diagnoses:  Occipital stroke ([place])      NEW MEDICATIONS STARTED DURING THIS VISIT:  New Prescriptions   No medications on file     Note:  This document was prepared using Dragon voice recognition software and may include unintentional dictation errors.    Merlyn Lot, MD 10/22/17 [phone]

## 2017-10-22 NOTE — Consult Note (Signed)
Referring Physician: Leslye Peer    Chief Complaint: Left vision loss  HPI: Stacey [name] is an 77 y.o. female who awakened this morning with no vision abnormalities.  Reports that while in the kitchen later in the morning noted that she was able to see her right hand but not her left.  Then noted that she was unable to see anything well to the left.  Went to see her eye doctor who referred her to the ED for further evaluation.   Initial NIHSS of 2.  Date last known well: Date: 10/22/2017 Time last known well: Time: 09:45 tPA Given: No: Outside time window  Past Medical History:  Diagnosis Date  . Abnormal nuclear stress test 05/24/2017  . Arthritis   . Asthma   . 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 Cancer ([place])   . Complication of anesthesia   . Depression   . Family history of adverse reaction to anesthesia    most of family - PONV  . Fibromyalgia   . Glaucoma   . Headache    migraines - 1-2x/mo  . HTN (hypertension) 03/09/2007  . Hyperlipidemia LDL goal <70 03/25/1993  . Hypertension   . Motion sickness    all moving vehicles  . PONV (postoperative nausea and vomiting)   . Thyroid disease     Past Surgical History:  Procedure Laterality Date  . ABDOMINAL HYSTERECTOMY    . APPENDECTOMY    . BLADDER SURGERY    . CATARACT EXTRACTION W/ INTRAOCULAR LENS  IMPLANT, BILATERAL    . COLONOSCOPY WITH PROPOFOL N/A 01/17/2016   Procedure: COLONOSCOPY WITH PROPOFOL;  Surgeon: [name], MD;  Location: [place];  Service: Endoscopy;  Laterality: N/A;  . CORONARY ARTERY BYPASS GRAFT N/A 02/20/2017   Procedure: CORONARY ARTERY BYPASS GRAFTING (CABG) x , three using left internal mammary artery to left anterior descending coronary artery and right greater saphenous vein harvested endoscopically to distal right and diagonal coronary arteries.;  Surgeon: Grace Isaac, MD;  Location: [place];  Service: Open Heart Surgery;   Laterality: N/A;  . LEFT HEART CATH AND CORONARY ANGIOGRAPHY N/A 02/20/2017   Procedure: LEFT HEART CATH AND CORONARY ANGIOGRAPHY;  Surgeon: Isaias Cowman, MD;  Location: [facility] [facility];  Service: Cardiovascular;  Laterality: N/A;  . LEFT HEART CATH AND CORS/GRAFTS ANGIOGRAPHY N/A 05/24/2017   Procedure: LEFT HEART CATH AND CORS/GRAFTS ANGIOGRAPHY;  Surgeon: Sherren Mocha, MD;  Location: [place] [facility];  Service: Cardiovascular;  Laterality: N/A;  . NASAL SINUS SURGERY    . POLYPECTOMY  01/17/2016   Procedure: POLYPECTOMY;  Surgeon: [name], MD;  Location: [place];  Service: Endoscopy;;  . RIGHT OOPHORECTOMY    . TEE WITHOUT CARDIOVERSION N/A 02/20/2017   Procedure: TRANSESOPHAGEAL ECHOCARDIOGRAM (TEE);  Surgeon: Grace Isaac, MD;  Location: [facility] [facility];  Service: Open Heart Surgery;  Laterality: N/A;    Family History  Problem Relation Age of Onset  . Alzheimer's disease Mother   . Kidney cancer Mother   . Heart attack Father   . Pancreatic cancer Sister   . Heart attack Brother 58  . Ovarian cancer Sister   . Kidney disease Sister        dialysis  . Cancer Sister        uterin  . Diabetes Sister   . Heart attack Sister   . Heart attack Brother 66   Social History:  reports  that she quit smoking about 42 years ago. She has never used smokeless tobacco. She reports that she does not drink alcohol or use drugs.  Allergies:  Allergies  Allergen Reactions  . Cephalexin Anaphylaxis  . Atorvastatin   . Morphine And Related Hives  . Novocain [Procaine]     Chest pain  . Shellfish Allergy Swelling    angioedema  . Statins Other (See Comments)    Muscle pain  . Tomato     (by testing) - also beans, wheat, peas  . Penicillins Hives and Rash    Medications: I have reviewed the patient's current medications. Prior to Admission:  Prior to Admission medications   Medication Sig Start Date End Date Taking? Authorizing Provider  acetaminophen  (TYLENOL) 325 MG tablet Take 2 tablets (650 mg total) by mouth every 6 (six) hours as needed for mild pain. 02/28/17   Nani Skillern, PA-C  albuterol (VENTOLIN HFA) 108 (90 Base) MCG/ACT inhaler INHALE 2 PUFFS BY MOUTH EVERY 4 TO 6 HOURS AS NEEDED FOR SHORTNESS OF BREATH 12/17/16   Mar Daring, PA-C  amLODipine (NORVASC) 5 MG tablet Take 1 tablet (5 mg total) daily by mouth. 04/04/17   [name], MD  aspirin 81 MG tablet ASPIRIN, 81MG  (Oral Tablet Delayed Release)  1 Every Day for 0 days  Quantity: 0.00;  Refills: 0   Ordered :17-November-2010  [name] ;  Started 05-Oct-2006 Active Comments: DX: 401.9 10/05/06   [provider]  buPROPion (WELLBUTRIN XL) 300 MG 24 hr tablet Take 1 tablet (300 mg total) by mouth daily. 03/22/17   [name], MD  capsaicin (ZOSTRIX) 0.025 % cream Apply 1 application topically as needed (nerve pain in chest).    [provider]  celecoxib (CELEBREX) 200 MG capsule Take 1 capsule (200 mg total) by mouth daily. 06/01/17   [name], MD  Cholecalciferol (VITAMIN D) 2000 units tablet Take 2,000 Units by mouth daily.    [provider]  Cyanocobalamin (B-12) 500 MCG TABS Take by mouth.    [provider]  diazepam (VALIUM) 5 MG tablet Take 1 tablet (5 mg total) by mouth every 12 (twelve) hours as needed for anxiety. 08/30/17   [name], MD  EPINEPHrine (EPIPEN 2-PAK) 0.3 mg/0.3 mL IJ SOAJ injection INJECT AS DIRECTED FOR SEVERE ALLERGIC REACTION 12/17/16   Mar Daring, PA-C  levothyroxine (SYNTHROID, LEVOTHROID) 100 MCG tablet Take 1 tablet (100 mcg total) by mouth daily. 04/30/17   [name], MD  lisinopril (PRINIVIL,ZESTRIL) 40 MG tablet Take 1 tablet (40 mg total) by mouth daily. 04/27/17   [name], MD  metoprolol tartrate (LOPRESSOR) 25 MG tablet Take 1 tablet (25 mg total) by mouth 2 (two) times daily. 05/24/17   Barrett, Evelene Croon, PA-C   Multiple Vitamin (MULTI-VITAMINS) TABS Take 1 tablet by mouth daily.    [provider]  oxymetazoline (AFRIN) 0.05 % nasal spray Place 1 spray into both nostrils as needed for congestion.    [provider]  rosuvastatin (CRESTOR) 40 MG tablet Take 1 tablet (40 mg total) by mouth daily. 06/01/17   [name], MD  traMADol (ULTRAM) 50 MG tablet Take 1 tablet (50 mg total) by mouth every 6 (six) hours as needed for moderate pain. 02/28/17   Nani Skillern, PA-C  zolpidem (AMBIEN) 5 MG tablet Take 1 tablet (5 mg total) by mouth at bedtime as  needed. for sleep 06/01/17   [name], MD     ROS: History obtained from the patient  General ROS: weakness Psychological ROS: negative for - behavioral disorder, hallucinations, memory difficulties, mood swings or suicidal ideation Ophthalmic ROS: as noted in HPI ENT ROS: negative for - epistaxis, nasal discharge, oral lesions, sore throat, tinnitus or vertigo Allergy and Immunology ROS: negative for - hives or itchy/watery eyes Hematological and Lymphatic ROS: negative for - bleeding problems, bruising or swollen lymph nodes Endocrine ROS: negative for - galactorrhea, hair pattern changes, polydipsia/polyuria or temperature intolerance Respiratory ROS: cough Cardiovascular ROS: negative for - chest pain, dyspnea on exertion, edema or irregular heartbeat Gastrointestinal ROS: negative for - abdominal pain, diarrhea, hematemesis, nausea/vomiting or stool incontinence Genito-Urinary ROS: negative for - dysuria, hematuria, incontinence or urinary frequency/urgency Musculoskeletal ROS: negative for - joint swelling or muscular weakness Neurological ROS: as noted in HPI, right leg numbness Dermatological ROS: negative for rash and skin lesion changes  Physical Examination: Blood pressure (!) 152/87, pulse 72, temperature 98.2 F (36.8 C), temperature source Oral, resp. rate 16, height 5\' 2"  (1.575 m), weight  72.6 kg (160 lb), SpO2 98 %.  HEENT-  Normocephalic, no lesions, without obvious abnormality.  Normal external eye and conjunctiva.  Normal TM's bilaterally.  Normal auditory canals and external ears. Normal external nose, mucus membranes and septum.  Normal pharynx. Cardiovascular- S1, S2 normal, pulses palpable throughout   Lungs- chest clear, no wheezing, rales, normal symmetric air entry Abdomen- soft, non-tender; bowel sounds normal; no masses,  no organomegaly Extremities- no edema Lymph-no adenopathy palpable Musculoskeletal-no joint tenderness, deformity or swelling Skin-warm and dry, no hyperpigmentation, vitiligo, or suspicious lesions  Neurological Examination   Mental Status: Alert, oriented, thought content appropriate.  Speech fluent without evidence of aphasia.  Able to follow 3 step commands without difficulty. Cranial Nerves: II: Discs flat bilaterally; LHH, pupils equal, round, and chemically dilated III,IV, VI: ptosis not present, extra-ocular motions intact bilaterally V,VII: smile symmetric, facial light touch sensation normal bilaterally VIII: hearing normal bilaterally IX,X: gag reflex present XI: bilateral shoulder shrug XII: midline tongue extension Motor: Right : Upper extremity   5/5    Left:     Upper extremity   5/5  Lower extremity   5/5     Lower extremity   5/5 Tone and bulk:normal tone throughout; no atrophy noted Sensory: Pinprick and light touch decreased in the right lower extremity Deep Tendon Reflexes: 2+ and symmetric with absent AJ's bilaterally Plantars: Right: downgoing   Left: downgoing Cerebellar: Normal finger-to-nose and normal heel-to-shin testing bilaterally Gait: not tested due to safety concerns    Laboratory Studies:  Basic Metabolic Panel: No results for input(s): NA, K, CL, CO2, GLUCOSE, BUN, CREATININE, CALCIUM, MG, PHOS in the last 168 hours.  Liver Function Tests: No results for input(s): AST, ALT, ALKPHOS, BILITOT, PROT,  ALBUMIN in the last 168 hours. No results for input(s): LIPASE, AMYLASE in the last 168 hours. No results for input(s): AMMONIA in the last 168 hours.  CBC: No results for input(s): WBC, NEUTROABS, HGB, HCT, MCV, PLT in the last 168 hours.  Cardiac Enzymes: No results for input(s): CKTOTAL, CKMB, CKMBINDEX, TROPONINI in the last 168 hours.  BNP: Invalid input(s): POCBNP  CBG: Recent Labs  Lab 10/22/17 1503  GLUCAP 109*    Microbiology: Results for orders placed or performed in visit on 09/16/17  Urine Culture     Status: Abnormal   Collection Time: 09/16/17  8:33 AM  Result Value Ref Range Status   Urine Culture, Routine Final report (A)  Final   Organism ID, Bacteria Escherichia coli (A)  Final    Comment: 10,000-25,000 colony forming units per mL Cefazolin <=4 ug/mL Cefazolin with an MIC <=16 predicts susceptibility to the oral agents cefaclor, cefdinir, cefpodoxime, cefprozil, cefuroxime, cephalexin, and loracarbef when used for therapy of uncomplicated urinary tract infections due to E. coli, Klebsiella pneumoniae, and Proteus mirabilis.    Antimicrobial Susceptibility Comment  Final    Comment:       ** S = Susceptible; I = Intermediate; R = Resistant **                    P = Positive; N = Negative             MICS are expressed in micrograms per mL    Antibiotic                 RSLT#1    RSLT#2    RSLT#3    RSLT#4 Amoxicillin/Clavulanic Acid    S Ampicillin                     R Cefepime                       S Ceftriaxone                    S Cefuroxime                     S Ciprofloxacin                  S Ertapenem                      S Gentamicin                     S Imipenem                       S Levofloxacin                   S Meropenem                      S Nitrofurantoin                 S Piperacillin/Tazobactam        S Tetracycline                   S Tobramycin                     S Trimethoprim/Sulfa             S   Microscopic  Examination     Status: Abnormal   Collection Time: 09/16/17  8:33 AM  Result Value Ref Range Status   WBC, UA 6-10 (A) 0 - 5 /hpf Final   RBC, UA >30 (A) 0 - 2 /hpf Final   Epithelial Cells (non renal) None seen 0 - 10 /hpf Final   Casts None seen None seen /lpf Final   Mucus, UA Present Not Estab. Final   Bacteria, UA Few None seen/Few Final    Coagulation Studies: No results for input(s): LABPROT, INR in the last 72 hours.  Urinalysis: No results for input(s): COLORURINE, LABSPEC, [place],  GLUCOSEU, HGBUR, BILIRUBINUR, KETONESUR, PROTEINUR, UROBILINOGEN, NITRITE, LEUKOCYTESUR in the last 168 hours.  Invalid input(s): APPERANCEUR  Lipid Panel:    Component Value Date/Time   CHOL 155 07/12/2017 0803   TRIG 148 07/12/2017 0803   HDL 54 07/12/2017 0803   CHOLHDL 2.9 07/12/2017 0803   CHOLHDL 2.9 04/29/2017 0801   VLDL 19 02/20/2017 0738   LDLCALC 71 07/12/2017 0803   LDLCALC 86 04/29/2017 0801    HgbA1C:  Lab Results  Component Value Date   HGBA1C 5.4 12/18/2016    Urine Drug Screen:  No results found for: LABOPIA, COCAINSCRNUR, LABBENZ, AMPHETMU, THCU, LABBARB  Alcohol Level: No results for input(s): ETH in the last 168 hours.   Imaging: Ct Head Code Stroke Wo Contrast  Result Date: 10/22/2017 CLINICAL DATA:  Code stroke. Right-sided vision loss since this morning EXAM: CT HEAD WITHOUT CONTRAST TECHNIQUE: Contiguous axial images were obtained from the base of the skull through the vertex without intravenous contrast. COMPARISON:  04/16/2014 FINDINGS: Brain: Small focus of cytotoxic type low density in right occipital lobe, just below the parieto-occipital sulcus. Anticipate MRI confirmation. Negative for hemorrhage. No hydrocephalus or masslike finding. Vascular: No high-density vessel.  Mild calcification. Skull: No acute finding Sinuses/Orbits: Bilateral cataract resection These results were called by telephone at the time of interpretation on 10/22/2017 at 3:02 pm to Dr.  Harvest Dark , who verbally acknowledged these results. 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 Electronically Signed   By: Monte Fantasia M.D.   On: 10/22/2017 15:02    Assessment: 76 y.o. female presenting outside the tPA window with [facility].  No other new findings noted on neurological examination.  Head CT reviewed and shows a right occipital infarct.  Further work up recommended.  Patient on ASA at home.    Stroke Risk Factors - hyperlipidemia and hypertension  Plan: 1. HgbA1c, fasting lipid panel 2. MRI, MRA  of the brain without contrast 3. PT consult, OT consult, Speech consult 4. Echocardiogram 5. Carotid dopplers 6. Prophylactic therapy-Antiplatelet med: Aspirin - dose 325mg  daily, continue statin 7. NPO until RN stroke swallow screen 8. Telemetry monitoring 9. Frequent neuro checks   Case discussed with Dr. Burnett Sheng, MD Neurology [phone] 10/22/2017, 3:17 PM

## 2017-10-22 NOTE — ED Notes (Signed)
Report called to [facility] and pt to be transported to floor.

## 2017-10-22 NOTE — ED Notes (Signed)
Pt arrives via POV with visual changes that started at 0945 today. Pt is CAOx4 and c/o HA 5/10 pain. Pt reports her vision was blurry in both eyes and worse in the left eye. Pt reports right leg with numbness but that is normal since her MI. 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 Pt reports that she went to her eye doctor today and had her eyes dilated before coming here.

## 2017-10-22 NOTE — ED Triage Notes (Signed)
C/O loss of visual files to lower fields bilaterally.  Onset of symptoms this morning at 1045.

## 2017-10-23 ENCOUNTER — Inpatient Hospital Stay: Payer: Medicare Other

## 2017-10-23 ENCOUNTER — Inpatient Hospital Stay
Admit: 2017-10-23 | Discharge: 2017-10-23 | Disposition: A | Discharge status: Home / Self Care | Payer: Medicare Other | Attending: Internal Medicine | Admitting: Internal Medicine

## 2017-10-23 DIAGNOSIS — I639 Cerebral infarction, unspecified: Secondary | ICD-10-CM

## 2017-10-23 LAB — LIPID PANEL
Cholesterol: 137 mg/dL (ref 0–200)
HDL: 45 mg/dL (ref >40)
LDL Cholesterol: 68 mg/dL (ref 0–99)
Total CHOL/HDL Ratio: 3 RATIO
Triglycerides: 119 mg/dL (ref <150)
VLDL: 24 mg/dL (ref 0–40)

## 2017-10-23 LAB — HEMOGLOBIN A1C
Hgb A1c MFr Bld: 5.4 % (ref 4.8–5.6)
MEAN PLASMA GLUCOSE: 108.28 mg/dL

## 2017-10-23 MED ORDER — APIXABAN 5 MG PO TABS
5.0000 mg | ORAL_TABLET | Freq: Two times a day (BID) | ORAL | Status: DC
  Administered 2017-10-23 – 2017-10-24 (×3): 5 mg via ORAL
  Filled 2017-10-23 (×3): qty 1

## 2017-10-23 NOTE — Progress Notes (Signed)
Referring Physician: Leslye Peer    Chief Complaint: Left vision loss  Interval history 10/23/2017: Husband at bedside. 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 Discussed I agree with cardiology with Eliquis. Answered all questions.  HPI: Stacey [name] is an 77 y.o. female who awakened this morning with no vision abnormalities.  Reports that while in the kitchen later in the morning noted that she was able to see her right hand but not her left.  Then noted that she was unable to see anything well to the left.  Went to see her eye doctor who referred her to the ED for further evaluation.   Initial NIHSS of 2.  Date last known well: Date: 10/22/2017 Time last known well: Time: 09:45 tPA Given: No: Outside time window  Past Medical History:  Diagnosis Date  . Abnormal nuclear stress test 05/24/2017  . Arthritis   . Asthma   . Atrial fibrillation, transient ([place])   . 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 Cancer ([place])   . Complication of anesthesia   . Depression   . Family history of adverse reaction to anesthesia    most of family - PONV  . Fibromyalgia   . Glaucoma   . Headache    migraines - 1-2x/mo  . HTN (hypertension) 03/09/2007  . Hyperlipidemia LDL goal <70 03/25/1993  . Hypertension   . Motion sickness    all moving vehicles  . PONV (postoperative nausea and vomiting)   . Thyroid disease     Past Surgical History:  Procedure Laterality Date  . ABDOMINAL HYSTERECTOMY    . APPENDECTOMY    . BLADDER SURGERY    . CATARACT EXTRACTION W/ INTRAOCULAR LENS  IMPLANT, BILATERAL    . COLONOSCOPY WITH PROPOFOL N/A 01/17/2016   Procedure: COLONOSCOPY WITH PROPOFOL;  Surgeon: [name], MD;  Location: [place];  Service: Endoscopy;  Laterality: N/A;  . CORONARY ARTERY BYPASS GRAFT N/A 02/20/2017   Procedure: CORONARY ARTERY BYPASS GRAFTING (CABG) x , three using left internal mammary artery to left anterior  descending coronary artery and right greater saphenous vein harvested endoscopically to distal right and diagonal coronary arteries.;  Surgeon: Grace Isaac, MD;  Location: [place];  Service: Open Heart Surgery;  Laterality: N/A;  . LEFT HEART CATH AND CORONARY ANGIOGRAPHY N/A 02/20/2017   Procedure: LEFT HEART CATH AND CORONARY ANGIOGRAPHY;  Surgeon: Isaias Cowman, MD;  Location: [place] [facility];  Service: Cardiovascular;  Laterality: N/A;  . LEFT HEART CATH AND CORS/GRAFTS ANGIOGRAPHY N/A 05/24/2017   Procedure: LEFT HEART CATH AND CORS/GRAFTS ANGIOGRAPHY;  Surgeon: Sherren Mocha, MD;  Location: [facility];  Service: Cardiovascular;  Laterality: N/A;  . NASAL SINUS SURGERY    . POLYPECTOMY  01/17/2016   Procedure: POLYPECTOMY;  Surgeon: [name], MD;  Location: [facility];  Service: Endoscopy;;  . RIGHT OOPHORECTOMY    . TEE WITHOUT CARDIOVERSION N/A 02/20/2017   Procedure: TRANSESOPHAGEAL ECHOCARDIOGRAM (TEE);  Surgeon: Grace Isaac, MD;  Location: [place];  Service: Open Heart Surgery;  Laterality: N/A;    Family History  Problem Relation Age of Onset  . Alzheimer's disease Mother   . Kidney cancer Mother   . Heart attack Father   . Pancreatic cancer Sister   . Heart attack Brother 57  . Ovarian cancer Sister   . Kidney disease Sister        dialysis  . Cancer  Sister        uterin  . Diabetes Sister   . Heart attack Sister   . Heart attack Brother 9  . Ovarian cancer Sister    Social History:  reports that she quit smoking about 42 years ago. She has never used smokeless tobacco. She reports that she does not drink alcohol or use drugs.  Allergies:  Allergies  Allergen Reactions  . Cephalexin Anaphylaxis  . Atorvastatin   . Morphine And Related Hives  . Novocain [Procaine]     Chest pain  . Shellfish Allergy Swelling    angioedema  . Statins Other (See Comments)    Muscle pain  . Tomato     (by testing) - also beans, wheat, peas   . Penicillins Hives and Rash    Medications: I have reviewed the patient's current medications. Prior to Admission:  Prior to Admission medications   Medication Sig Start Date End Date Taking? Authorizing Provider  acetaminophen (TYLENOL) 325 MG tablet Take 2 tablets (650 mg total) by mouth every 6 (six) hours as needed for mild pain. 02/28/17   Nani Skillern, PA-C  albuterol (VENTOLIN HFA) 108 (90 Base) MCG/ACT inhaler INHALE 2 PUFFS BY MOUTH EVERY 4 TO 6 HOURS AS NEEDED FOR SHORTNESS OF BREATH 12/17/16   Mar Daring, PA-C  amLODipine (NORVASC) 5 MG tablet Take 1 tablet (5 mg total) daily by mouth. 04/04/17   [name], MD  aspirin 81 MG tablet ASPIRIN, 81MG  (Oral Tablet Delayed Release)  1 Every Day for 0 days  Quantity: 0.00;  Refills: 0   Ordered :17-November-2010  [name] ;  Started 05-Oct-2006 Active Comments: DX: 401.9 10/05/06   [provider]  buPROPion (WELLBUTRIN XL) 300 MG 24 hr tablet Take 1 tablet (300 mg total) by mouth daily. 03/22/17   [name], MD  capsaicin (ZOSTRIX) 0.025 % cream Apply 1 application topically as needed (nerve pain in chest).    [provider]  celecoxib (CELEBREX) 200 MG capsule Take 1 capsule (200 mg total) by mouth daily. 06/01/17   [name], MD  Cholecalciferol (VITAMIN D) 2000 units tablet Take 2,000 Units by mouth daily.    [provider]  Cyanocobalamin (B-12) 500 MCG TABS Take by mouth.    [provider]  diazepam (VALIUM) 5 MG tablet Take 1 tablet (5 mg total) by mouth every 12 (twelve) hours as needed for anxiety. 08/30/17   [name], MD  EPINEPHrine (EPIPEN 2-PAK) 0.3 mg/0.3 mL IJ SOAJ injection INJECT AS DIRECTED FOR SEVERE ALLERGIC REACTION 12/17/16   Mar Daring, PA-C  levothyroxine (SYNTHROID, LEVOTHROID) 100 MCG tablet Take 1 tablet (100 mcg total) by mouth daily. 04/30/17   [name], MD  lisinopril (PRINIVIL,ZESTRIL)  40 MG tablet Take 1 tablet (40 mg total) by mouth daily. 04/27/17   [name], MD  metoprolol tartrate (LOPRESSOR) 25 MG tablet Take 1 tablet (25 mg total) by mouth 2 (two) times daily. 05/24/17   Barrett, Evelene Croon, PA-C  Multiple Vitamin (MULTI-VITAMINS) TABS Take 1 tablet by mouth daily.    [provider]  oxymetazoline (AFRIN) 0.05 % nasal spray Place 1 spray into both nostrils as needed for congestion.    [provider]  rosuvastatin (CRESTOR) 40 MG tablet Take 1 tablet (40 mg total) by mouth daily. 06/01/17   [name], MD  traMADol Veatrice Bourbon) 50 MG tablet Take 1 tablet (  50 mg total) by mouth every 6 (six) hours as needed for moderate pain. 02/28/17   Nani Skillern, PA-C  zolpidem (AMBIEN) 5 MG tablet Take 1 tablet (5 mg total) by mouth at bedtime as needed. for sleep 06/01/17   [name], MD     ROS: History obtained from the patient  General ROS: weakness Psychological ROS: negative for - behavioral disorder, hallucinations, memory difficulties, mood swings or suicidal ideation Ophthalmic ROS: as noted in HPI ENT ROS: negative for - epistaxis, nasal discharge, oral lesions, sore throat, tinnitus or vertigo Allergy and Immunology ROS: negative for - hives or itchy/watery eyes Hematological and Lymphatic ROS: negative for - bleeding problems, bruising or swollen lymph nodes Endocrine ROS: negative for - galactorrhea, hair pattern changes, polydipsia/polyuria or temperature intolerance Respiratory ROS: cough Cardiovascular ROS: negative for - chest pain, dyspnea on exertion, edema or irregular heartbeat Gastrointestinal ROS: negative for - abdominal pain, diarrhea, hematemesis, nausea/vomiting or stool incontinence Genito-Urinary ROS: negative for - dysuria, hematuria, incontinence or urinary frequency/urgency Musculoskeletal ROS: negative for - joint swelling or muscular weakness Neurological ROS: as noted in HPI, right leg  numbness Dermatological ROS: negative for rash and skin lesion changes  Physical Examination: Blood pressure 109/86, pulse 94, temperature 98.1 F (36.7 C), temperature source Oral, resp. rate 18, height 5\' 2"  (1.575 m), weight 160 lb (72.6 kg), SpO2 96 %.  HEENT-  Normocephalic, no lesions, without obvious abnormality.  Normal external eye and conjunctiva.  Normal TM's bilaterally.  Normal auditory canals and external ears. Normal external nose, mucus membranes and septum.  Normal pharynx. Cardiovascular- S1, S2 normal, pulses palpable throughout   Lungs- chest clear, no wheezing, rales, normal symmetric air entry Abdomen- soft, non-tender; bowel sounds normal; no masses,  no organomegaly Extremities- no edema Lymph-no adenopathy palpable Musculoskeletal-no joint tenderness, deformity or swelling Skin-warm and dry, no hyperpigmentation, vitiligo, or suspicious lesions  Neurological Examination   Mental Status: Alert, oriented, thought content appropriate.  Speech fluent without evidence of aphasia.  Able to follow 3 step commands without difficulty. Cranial Nerves: II: Discs flat bilaterally; LHH, pupils equal, round, and chemically dilated III,IV, VI: ptosis not present, extra-ocular motions intact bilaterally V,VII: smile symmetric, facial light touch sensation normal bilaterally VIII: hearing normal bilaterally IX,X: gag reflex present XI: bilateral shoulder shrug XII: midline tongue extension Motor: Right : Upper extremity   5/5    Left:     Upper extremity   5/5  Lower extremity   5/5     Lower extremity   5/5 Tone and bulk:normal tone throughout; no atrophy noted Sensory: Pinprick and light touch decreased in the right lower extremity Deep Tendon Reflexes: 2+ and symmetric with absent AJ's bilaterally Plantars: Right: downgoing   Left: downgoing Cerebellar: Normal finger-to-nose and normal heel-to-shin testing bilaterally Gait: not tested due to safety concerns     Laboratory Studies:  Basic Metabolic Panel: Recent Labs  Lab 10/22/17 1507  NA 135  K 4.0  CL 98*  CO2 26  GLUCOSE 118*  BUN 18  CREATININE 0.87  CALCIUM 9.1    Liver Function Tests: Recent Labs  Lab 10/22/17 1507  AST 27  ALT 20  ALKPHOS 81  BILITOT 0.8  PROT 7.4  ALBUMIN 4.4   No results for input(s): LIPASE, AMYLASE in the last 168 hours. No results for input(s): AMMONIA in the last 168 hours.  CBC: Recent Labs  Lab 10/22/17 1507  WBC 7.7  NEUTROABS 6.1  HGB 14.3  HCT  42.9  MCV 86.4  PLT 155    Cardiac Enzymes: Recent Labs  Lab 10/22/17 1507  TROPONINI <0.03    BNP: Invalid input(s): POCBNP  CBG: Recent Labs  Lab 10/22/17 1503  GLUCAP 109*    Microbiology: Results for orders placed or performed in visit on 09/16/17  Urine Culture     Status: Abnormal   Collection Time: 09/16/17  8:33 AM  Result Value Ref Range Status   Urine Culture, Routine Final report (A)  Final   Organism ID, Bacteria Escherichia coli (A)  Final    Comment: 10,000-25,000 colony forming units per mL Cefazolin <=4 ug/mL Cefazolin with an MIC <=16 predicts susceptibility to the oral agents cefaclor, cefdinir, cefpodoxime, cefprozil, cefuroxime, cephalexin, and loracarbef when used for therapy of uncomplicated urinary tract infections due to E. coli, Klebsiella pneumoniae, and Proteus mirabilis.    Antimicrobial Susceptibility Comment  Final    Comment:       ** S = Susceptible; I = Intermediate; R = Resistant **                    P = Positive; N = Negative             MICS are expressed in micrograms per mL    Antibiotic                 RSLT#1    RSLT#2    RSLT#3    RSLT#4 Amoxicillin/Clavulanic Acid    S Ampicillin                     R Cefepime                       S Ceftriaxone                    S Cefuroxime                     S Ciprofloxacin                  S Ertapenem                      S Gentamicin                     S Imipenem                        S Levofloxacin                   S Meropenem                      S Nitrofurantoin                 S Piperacillin/Tazobactam        S Tetracycline                   S Tobramycin                     S Trimethoprim/Sulfa             S   Microscopic Examination     Status: Abnormal   Collection Time: 09/16/17  8:33 AM  Result Value Ref Range Status   WBC, UA 6-10 (A) 0 - 5 /hpf Final   RBC,  UA >30 (A) 0 - 2 /hpf Final   Epithelial Cells (non renal) None seen 0 - 10 /hpf Final   Casts None seen None seen /lpf Final   Mucus, UA Present Not Estab. Final   Bacteria, UA Few None seen/Few Final    Coagulation Studies: Recent Labs    10/22/17 1507  LABPROT 13.4  INR 1.03    Urinalysis: No results for input(s): COLORURINE, LABSPEC, PHURINE, GLUCOSEU, HGBUR, BILIRUBINUR, KETONESUR, PROTEINUR, UROBILINOGEN, NITRITE, LEUKOCYTESUR in the last 168 hours.  Invalid input(s): APPERANCEUR  Lipid Panel:    Component Value Date/Time   CHOL 137 10/23/2017 0431   CHOL 155 07/12/2017 0803   TRIG 119 10/23/2017 0431   HDL 45 10/23/2017 0431   HDL 54 07/12/2017 0803   CHOLHDL 3.0 10/23/2017 0431   VLDL 24 10/23/2017 0431   LDLCALC 68 10/23/2017 0431   LDLCALC 71 07/12/2017 0803   LDLCALC 86 04/29/2017 0801    HgbA1C:  Lab Results  Component Value Date   HGBA1C 5.4 10/22/2017    Urine Drug Screen:  No results found for: LABOPIA, COCAINSCRNUR, LABBENZ, AMPHETMU, THCU, LABBARB  Alcohol Level:  Recent Labs  Lab 10/22/17 [place] <10     Imaging: Dg Chest 2 View  Result Date: 10/22/2017 CLINICAL DATA:  Cough for 1 day.  Ex-smoker. EXAM: CHEST - 2 VIEW COMPARISON:  05/22/2017. FINDINGS: Borderline enlarged cardiac silhouette with an interval decrease in size. Stable post CABG changes. Stable mild diffuse peribronchial thickening. Clear lungs with normal vascularity. Diffuse osteopenia. Atheromatous aortic calcifications. IMPRESSION: No acute abnormality.  Stable mild chronic  bronchitic changes. Electronically Signed   By: Claudie Revering M.D.   On: 10/22/2017 16:25   Mr Brain Wo Contrast  Result Date: 10/23/2017 CLINICAL DATA:  77 y/o  F; abnormal vision changes. EXAM: MRI HEAD WITHOUT CONTRAST TECHNIQUE: Multiplanar, multiecho pulse sequences of the brain and surrounding structures were obtained without intravenous contrast. COMPARISON:  10/22/2017 CT head FINDINGS: Brain: Small focus of reduced diffusion within the right paramedian parietooccipital junction compatible with acute/early subacute infarction. No associated hemorrhage or mass effect. The infarct is stable in size in comparison with prior CT of the head given differences in technique. Few nonspecific foci of T2 FLAIR hyperintense signal abnormality in subcortical and periventricular white matter are compatible with mild chronic microvascular ischemic changes for age. No extra-axial collection, hydrocephalus, focal mass effect, or herniation. Vascular: Normal flow voids. Skull and upper cervical spine: Normal marrow signal. Sinuses/Orbits: Negative. Other: None. IMPRESSION: 1. Acute/early subacute infarction within the right parieto-occipital junction is stable in distribution in comparison with prior CT of the head. No associated hemorrhage or mass effect. 2. Mild chronic microvascular ischemic changes of the brain. Electronically Signed   By: Kristine Garbe M.D.   On: 10/23/2017 01:57   US Carotid Bilateral (at [facility] Only)  Result Date: 10/23/2017 CLINICAL DATA:  CVA. EXAM: BILATERAL CAROTID DUPLEX ULTRASOUND TECHNIQUE: Pearline Cables scale imaging, color Doppler and duplex ultrasound were performed of bilateral carotid and vertebral arteries in the neck. COMPARISON:  None. FINDINGS: Criteria: Quantification of carotid stenosis is based on velocity parameters that correlate the residual internal carotid diameter with NASCET-based stenosis levels, using the diameter of the distal internal carotid lumen as the  denominator for stenosis measurement. The following velocity measurements were obtained: RIGHT ICA:  97/35 cm/sec CCA:  95/62 cm/sec SYSTOLIC ICA/CCA RATIO:  1.3 ECA:  89 cm/sec LEFT ICA:  87/35 cm/sec CCA:  13/08 cm/sec SYSTOLIC ICA/CCA  RATIO:  1.2 ECA:  79 cm/sec RIGHT CAROTID ARTERY: There is a minimal amount of eccentric echogenic plaque involving the origin and proximal aspects of the right internal carotid artery (image 22), not resulting in elevated peak systolic velocities within the interrogated course of the right internal carotid artery to suggest a hemodynamically significant stenosis. RIGHT VERTEBRAL ARTERY:  Antegrade flow LEFT CAROTID ARTERY: There is no grayscale evidence of significant intimal thickening or atherosclerotic plaque affecting interrogated portions of the left carotid system. There are no elevated peak systolic velocities within the interrogated course the left internal carotid artery to suggest a hemodynamically significant stenosis. LEFT VERTEBRAL ARTERY:  Antegrade flow IMPRESSION: 1. Minimal amount of right-sided atherosclerotic plaque, not resulting in a hemodynamically significant stenosis within the right internal carotid artery. 2. Normal sonographic evaluation of the left carotid system. Electronically Signed   By: Sandi Mariscal M.D.   On: 10/23/2017 09:03   Ct Head Code Stroke Wo Contrast  Result Date: 10/22/2017 CLINICAL DATA:  Code stroke. Right-sided vision loss since this morning EXAM: CT HEAD WITHOUT CONTRAST TECHNIQUE: Contiguous axial images were obtained from the base of the skull through the vertex without intravenous contrast. COMPARISON:  04/16/2014 FINDINGS: Brain: Small focus of cytotoxic type low density in right occipital lobe, just below the parieto-occipital sulcus. Anticipate MRI confirmation. Negative for hemorrhage. No hydrocephalus or masslike finding. Vascular: No high-density vessel.  Mild calcification. Skull: No acute finding Sinuses/Orbits:  Bilateral cataract resection These results were called by telephone at the time of interpretation on 10/22/2017 at 3:02 pm to Dr. Harvest Dark , who verbally acknowledged these results. ASPECTS [facility] Stroke Program Early CT Score) Not scored with this history IMPRESSION: Small acute infarct in the right occipital lobe. Electronically Signed   By: Monte Fantasia M.D.   On: 10/22/2017 15:02   MRI brain: IMPRESSION: 1. Acute/early subacute infarction within the right parieto-occipital junction is stable in distribution in comparison with prior CT of the head. No associated hemorrhage or mass effect. 2. Mild chronic microvascular ischemic changes of the brain.   Assessment: 77 y.o. female presenting outside the tPA window with [facility].  No other new findings noted on neurological examination.  Head CT reviewed and shows a right occipital infarct.  Further work up recommended.  Patient on ASA at home.    Stroke Risk Factors - hyperlipidemia and hypertension  Plan: 1. ldl 68, goal < 70 controlled 2. MRI brain c/w embolic stroke 3. PT consult, OT consult, Speech consult 4. Echocardiogram: no pfo, nm EF, 5. Carotid dopplers: no significant stenosis 6. Prophylactic therapy- Agree with cardiology Eliquis 5mg  bid, continue statin 7. Patient has CAD, will defer to cardiology whether she should continue ASA 81mg  for this, per cardiology consult today.  8. Neuro will sign off at this time   Case discussed with Dr. Kyra Manges  MD Neurology [phone] 10/23/2017, 10:57 AM

## 2017-10-23 NOTE — Evaluation (Signed)
Physical Therapy Evaluation Patient Details Name: Stacey [name] MRN: [id number] DOB: 30-Apr-1941 Today's Date: 10/23/2017   History of Present Illness  pt is a 77 y.o F admitted on 10/22/2017 with dx of CVA. she has a hx of CABG and cancer. imaging on 6/1 that revealed subacute infarction within the right and Mild chronic microvascular ischemic changes of the brain.  Clinical Impression  Pt was laying in bed when PT entered the room. Pt is A&O x 4 and currently reports 5/10 low back pain and was previously seeing and outpatient PT for her back prior to being admitted. She currently demonstrates slight strength deficit on the LLE compared bil, and L visual field depth perception compared, and difficulty with RAMPS specifically with LLE/UE. She was I with bed mobility and ambulated with supervison ~200 ft with no changes in low back pain. She was able to perform exercises while seated in the recliner. She would benefit from physical therapy to promote strength and balance and return to PLOF. Upon discharge she would benefit from resuming physical therapy via outpatient physical therapy.     Follow Up Recommendations Outpatient PT    Equipment Recommendations  None recommended by PT    Recommendations for Other Services       Precautions / Restrictions Precautions Precautions: None Restrictions Weight Bearing Restrictions: No      Mobility  Bed Mobility Overal bed mobility: Independent                Transfers Overall transfer level: Independent Equipment used: None                Ambulation/Gait Ambulation/Gait assistance: Supervision Ambulation Distance (Feet): 200 Feet Assistive device: None Gait Pattern/deviations: WFL(Within Functional Limits)     General Gait Details: reported she felt alittle off and required verbal cues to scan environment due to lack of L peripheral field  Stairs            Wheelchair Mobility    Modified Rankin (Stroke  Patients Only)       Balance                                             Pertinent Vitals/Pain Pain Assessment: 0-10 Pain Score: 5  Pain Location: R low back Pain Descriptors / Indicators: Aching Pain Intervention(s): Monitored during session    Home Living Family/patient expects to be discharged to:: Private residence Living Arrangements: Spouse/significant other Available Help at Discharge: Family;Available 24 hours/day Type of Home: House Home Access: Stairs to enter Entrance Stairs-Rails: None Entrance Stairs-Number of Steps: 2 Home Layout: Two level;1/2 bath on main level;Able to live on main level with bedroom/bathroom Home Equipment: Hospital bed;Bedside commode      Prior Function Level of Independence: Independent               Hand Dominance   Dominant Hand: Right    Extremity/Trunk Assessment   Upper Extremity Assessment Upper Extremity Assessment: Defer to OT evaluation    Lower Extremity Assessment Lower Extremity Assessment: RLE deficits/detail;LLE deficits/detail RLE Deficits / Details: 4/5 general assessment LLE Deficits / Details: 4-/5 general overall LLE Sensation: WNL       Communication   Communication: No difficulties  Cognition   Behavior During Therapy: [facility] for tasks assessed/performed  General Comments      Exercises Other Exercises Other Exercises: LAQ 2 x 10 bil, ankle pumps 2 x 10 (decreased coordination with LLE compared bil), seated marching 2 x 10 alternationg L/R   Assessment/Plan    PT Assessment Patient needs continued PT services  PT Problem List Decreased safety awareness;Decreased balance;Decreased activity tolerance;Decreased strength;Decreased coordination;Decreased mobility       PT Treatment Interventions Gait training;Stair training;Therapeutic activities;Therapeutic exercise;Balance training;Neuromuscular  re-education;Patient/family education    PT Goals (Current goals can be found in the Care Plan section)  Acute Rehab PT Goals Patient Stated Goal: to go home PT Goal Formulation: With patient Time For Goal Achievement: 11/06/17 Potential to Achieve Goals: Good    Frequency Min 1X/week   Barriers to discharge        Co-evaluation               AM-PAC PT "6 Clicks" Daily Activity  Outcome Measure Difficulty turning over in bed (including adjusting bedclothes, sheets and blankets)?: None Difficulty moving from lying on back to sitting on the side of the bed? : None Difficulty sitting down on and standing up from a chair with arms (e.g., wheelchair, bedside commode, etc,.)?: None Help needed moving to and from a bed to chair (including a wheelchair)?: None Help needed walking in hospital room?: None Help needed climbing 3-5 steps with a railing? : A Little 6 Click Score: 23    End of Session   Activity Tolerance: Patient tolerated treatment well Patient left: in chair;with call bell/phone within reach;with family/visitor present Nurse Communication: Mobility status PT Visit Diagnosis: Muscle weakness (generalized) (M62.81);Other abnormalities of gait and mobility (R26.89);Other symptoms and signs involving the nervous system (R29.898)    Time: 8676-1950 PT Time Calculation (min) (ACUTE ONLY): 30 min   Charges:   PT Evaluation $PT Eval Low Complexity: 1 Low PT Treatments $Gait Training: 8-22 mins $Therapeutic Exercise: 8-22 mins   PT G Codes:        Parul Porcelli PT, DPT, LAT, ATC  10/23/17  12:03 PM       Ilianna Bown, Cheryle Horsfall 10/23/2017, 12:02 PM

## 2017-10-23 NOTE — Progress Notes (Signed)
[place] for Apixaban Indication: VTE prophylaxis in setting of transient afib  Allergies  Allergen Reactions  . Cephalexin Anaphylaxis  . Atorvastatin   . Morphine And Related Hives  . Novocain [Procaine]     Chest pain  . Shellfish Allergy Swelling    angioedema  . Statins Other (See Comments)    Muscle pain  . Tomato     (by testing) - also beans, wheat, peas  . Penicillins Hives and Rash    Patient Measurements: Height: 5\' 2"  (157.5 cm) Weight: 160 lb (72.6 kg) IBW/kg (Calculated) : 50.1  Vital Signs: Temp: 97.6 F (36.4 C) (06/01 1326) Temp Source: Oral (06/01 1326) BP: 121/90 (06/01 1326) Pulse Rate: 106 (06/01 1326)  Labs: Recent Labs    10/22/17 1507  HGB 14.3  HCT 42.9  PLT 155  APTT 33  LABPROT 13.4  INR 1.03  CREATININE 0.87  TROPONINI <0.03    Estimated Creatinine Clearance: 52.1 mL/min (by C-G formula based on SCr of 0.87 mg/dL).   Medical History: Past Medical History:  Diagnosis Date  . Abnormal nuclear stress test 05/24/2017  . Arthritis   . Asthma   . Atrial fibrillation, transient ([place])   . 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 Cancer ([place])   . Complication of anesthesia   . Depression   . Family history of adverse reaction to anesthesia    most of family - PONV  . Fibromyalgia   . Glaucoma   . Headache    migraines - 1-2x/mo  . HTN (hypertension) 03/09/2007  . Hyperlipidemia LDL goal <70 03/25/1993  . Hypertension   . Motion sickness    all moving vehicles  . PONV (postoperative nausea and vomiting)   . Thyroid disease     Medications:  Patient did not take any anticoagulants at home.  Assessment: 77 yo female admitted with vision loss. Patient has PMH significant for CAD s/p CABG and transient afib but was not on any anticoagulants at home. Pharmacy has been consulted to dose apixaban for VTE prophylaxis in setting of transient afib.     Plan:  Will start apixaban 5mg  BID. Pharmacy will continue to follow.   Lendon Ka, PharmD Pharmacy Resident 10/23/2017,1:47 PM

## 2017-10-23 NOTE — Evaluation (Signed)
SLP Cancellation Note  Patient Details Name: Stacey [name] MRN: [id number] DOB: [birth year]   Cancelled treatment:       Reason Eval/Treat Not Completed: SLP screened, no needs identified, will sign off  Pt husband in room and stated she has no slurred speech or coughing or choking when eating. SLP educated to contact [place] if changes occur and concerns to monitor for with ssx aspiration and speech deficits. Husband denies all these ssx. SLP will sign off with nsg and pt husband educated to contact if changes occur.    [place] Sauber 10/23/2017, 8:56 AM

## 2017-10-23 NOTE — Progress Notes (Signed)
[facility] [place] NAME: Stacey [name]    MR#:  [id number]  DATE OF BIRTH:  Feb 16, 1941  SUBJECTIVE:  CHIEF COMPLAINT:   Chief Complaint  Patient presents with  . Loss of Vision   -Patient presenting with left hemianopsia.  Complains about the same. -Appreciate neurology and cardiology consults  REVIEW OF SYSTEMS:  Review of Systems  Constitutional: Negative for chills, fever and malaise/fatigue.  HENT: Negative for congestion, ear discharge, hearing loss and nosebleeds.   Eyes: Positive for blurred vision.  Respiratory: Negative for cough, shortness of breath and wheezing.   Cardiovascular: Negative for chest pain and palpitations.  Gastrointestinal: Negative for abdominal pain, constipation, diarrhea, nausea and vomiting.  Genitourinary: Negative for dysuria.  Musculoskeletal: Negative for myalgias.  Neurological: Negative for dizziness, focal weakness, seizures, weakness and headaches.  Psychiatric/Behavioral: Negative for depression.    DRUG ALLERGIES:   Allergies  Allergen Reactions  . Cephalexin Anaphylaxis  . Atorvastatin   . Morphine And Related Hives  . Novocain [Procaine]     Chest pain  . Shellfish Allergy Swelling    angioedema  . Statins Other (See Comments)    Muscle pain  . Tomato     (by testing) - also beans, wheat, peas  . Penicillins Hives and Rash    VITALS:  Blood pressure 121/90, pulse (!) 106, temperature 97.6 F (36.4 C), temperature source Oral, resp. rate 18, height 5\' 2"  (1.575 m), weight 72.6 kg (160 lb), SpO2 96 %.  PHYSICAL EXAMINATION:  Physical Exam  GENERAL:  77 y.o.-year-old patient lying in the bed with no acute distress.  EYES: Pupils equal, round, reactive to light and accommodation. No scleral icterus. Extraocular muscles intact.  HEENT: Head atraumatic, normocephalic. Oropharynx and nasopharynx clear.  NECK:  Supple, no jugular venous distention. No thyroid enlargement, no  tenderness.  LUNGS: Normal breath sounds bilaterally, no wheezing, rales,rhonchi or crepitation. No use of accessory muscles of respiration.  CARDIOVASCULAR: S1, S2 normal. No  rubs, or gallops.  2/6 systolic murmur is present.  Bypass surgery scar present. ABDOMEN: Soft, nontender, nondistended. Bowel sounds present. No organomegaly or mass.  EXTREMITIES: No pedal edema, cyanosis, or clubbing.  NEUROLOGIC: Cranial nerves II through XII are intact except left hemianopsia present.  Muscle strength 5/5 in all extremities. Sensation intact. Gait not checked.  PSYCHIATRIC: The patient is alert and oriented x 3.  SKIN: No obvious rash, lesion, or ulcer.    LABORATORY PANEL:   CBC Recent Labs  Lab 10/22/17 1507  WBC 7.7  HGB 14.3  HCT 42.9  PLT 155   ------------------------------------------------------------------------------------------------------------------  Chemistries  Recent Labs  Lab 10/22/17 1507  NA 135  K 4.0  CL 98*  CO2 26  GLUCOSE 118*  BUN 18  CREATININE 0.87  CALCIUM 9.1  AST 27  ALT 20  ALKPHOS 81  BILITOT 0.8   ------------------------------------------------------------------------------------------------------------------  Cardiac Enzymes Recent Labs  Lab 10/22/17 1507  TROPONINI <0.03   ------------------------------------------------------------------------------------------------------------------  RADIOLOGY:  Dg Chest 2 View  Result Date: 10/22/2017 CLINICAL DATA:  Cough for 1 day.  Ex-smoker. EXAM: CHEST - 2 VIEW COMPARISON:  05/22/2017. FINDINGS: Borderline enlarged cardiac silhouette with an interval decrease in size. Stable post CABG changes. Stable mild diffuse peribronchial thickening. Clear lungs with normal vascularity. Diffuse osteopenia. Atheromatous aortic calcifications. IMPRESSION: No acute abnormality.  Stable mild chronic bronchitic changes. Electronically Signed   By: Claudie Revering M.D.   On: 10/22/2017 16:25   Mr Brain  Wo  Contrast  Result Date: 10/23/2017 CLINICAL DATA:  77 y/o  F; abnormal vision changes. EXAM: MRI HEAD WITHOUT CONTRAST TECHNIQUE: Multiplanar, multiecho pulse sequences of the brain and surrounding structures were obtained without intravenous contrast. COMPARISON:  10/22/2017 CT head FINDINGS: Brain: Small focus of reduced diffusion within the right paramedian parietooccipital junction compatible with acute/early subacute infarction. No associated hemorrhage or mass effect. The infarct is stable in size in comparison with prior CT of the head given differences in technique. Few nonspecific foci of T2 FLAIR hyperintense signal abnormality in subcortical and periventricular white matter are compatible with mild chronic microvascular ischemic changes for age. No extra-axial collection, hydrocephalus, focal mass effect, or herniation. Vascular: Normal flow voids. Skull and upper cervical spine: Normal marrow signal. Sinuses/Orbits: Negative. Other: None. IMPRESSION: 1. Acute/early subacute infarction within the right parieto-occipital junction is stable in distribution in comparison with prior CT of the head. No associated hemorrhage or mass effect. 2. Mild chronic microvascular ischemic changes of the brain. Electronically Signed   By: Kristine Garbe M.D.   On: 10/23/2017 01:57   US Carotid Bilateral (at [facility] Only)  Result Date: 10/23/2017 CLINICAL DATA:  CVA. EXAM: BILATERAL CAROTID DUPLEX ULTRASOUND TECHNIQUE: Pearline Cables scale imaging, color Doppler and duplex ultrasound were performed of bilateral carotid and vertebral arteries in the neck. COMPARISON:  None. FINDINGS: Criteria: Quantification of carotid stenosis is based on velocity parameters that correlate the residual internal carotid diameter with NASCET-based stenosis levels, using the diameter of the distal internal carotid lumen as the denominator for stenosis measurement. The following velocity measurements were obtained: RIGHT ICA:  97/35  cm/sec CCA:  52/84 cm/sec SYSTOLIC ICA/CCA RATIO:  1.3 ECA:  89 cm/sec LEFT ICA:  87/35 cm/sec CCA:  13/24 cm/sec SYSTOLIC ICA/CCA RATIO:  1.2 ECA:  79 cm/sec RIGHT CAROTID ARTERY: There is a minimal amount of eccentric echogenic plaque involving the origin and proximal aspects of the right internal carotid artery (image 22), not resulting in elevated peak systolic velocities within the interrogated course of the right internal carotid artery to suggest a hemodynamically significant stenosis. RIGHT VERTEBRAL ARTERY:  Antegrade flow LEFT CAROTID ARTERY: There is no grayscale evidence of significant intimal thickening or atherosclerotic plaque affecting interrogated portions of the left carotid system. There are no elevated peak systolic velocities within the interrogated course the left internal carotid artery to suggest a hemodynamically significant stenosis. LEFT VERTEBRAL ARTERY:  Antegrade flow IMPRESSION: 1. Minimal amount of right-sided atherosclerotic plaque, not resulting in a hemodynamically significant stenosis within the right internal carotid artery. 2. Normal sonographic evaluation of the left carotid system. Electronically Signed   By: Sandi Mariscal M.D.   On: 10/23/2017 09:03   Ct Head Code Stroke Wo Contrast  Result Date: 10/22/2017 CLINICAL DATA:  Code stroke. Right-sided vision loss since this morning EXAM: CT HEAD WITHOUT CONTRAST TECHNIQUE: Contiguous axial images were obtained from the base of the skull through the vertex without intravenous contrast. COMPARISON:  04/16/2014 FINDINGS: Brain: Small focus of cytotoxic type low density in right occipital lobe, just below the parieto-occipital sulcus. Anticipate MRI confirmation. Negative for hemorrhage. No hydrocephalus or masslike finding. Vascular: No high-density vessel.  Mild calcification. Skull: No acute finding Sinuses/Orbits: Bilateral cataract resection These results were called by telephone at the time of interpretation on 10/22/2017 at  3:02 pm to Dr. Harvest Dark , who verbally acknowledged these results. ASPECTS [facility] Stroke Program Early CT Score) Not scored with this history IMPRESSION: Small acute infarct in the  right occipital lobe. Electronically Signed   By: Monte Fantasia M.D.   On: 10/22/2017 15:02    EKG:   Orders placed or performed during the hospital encounter of 10/22/17  . ED EKG  . ED EKG    ASSESSMENT AND PLAN:   77 year old female with past medical history significant for CAD status post CABG, asthma, transient atrial fibrillation not on anticoagulation at home presents to hospital secondary to left hemianopsia  1.  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  MRI consistent with embolic infarct -Appreciate neurology and cardiology consults. -Consider starting Eliquis with history of transient atrial fibrillation -Continue statin -PT/OT consults -Rotted Dopplers with no hemodynamically significant stenosis.  Echocardiogram done and pending  2.  History of CAD-CABG in October 2018 -[place] cardiology consult.  Continue aspirin and statin -Eliquis will be added.  3.  Hypertension-continue home medications.  Patient on Norvasc and metoprolol  4.  Hypothyroidism-on Synthroid  5.  DVT prophylaxis-started on Eliquis.      All the records are reviewed and case discussed with Care Management/Social Workerr. Management plans discussed with the patient, family and they are in agreement.  CODE STATUS: Full code  TOTAL TIME TAKING CARE OF THIS PATIENT: 38 minutes.   POSSIBLE D/C IN 1-2 DAYS, DEPENDING ON CLINICAL CONDITION.   Gladstone Lighter M.D on 10/23/2017 at 1:36 PM  Between 7am to 6pm - Pager - [phone]  After 6pm go to [email or web address] - password [email or web address] [facility]  Office  [phone]  CC: Primary care physician; [name], MD

## 2017-10-23 NOTE — Consult Note (Signed)
[name] Cardiology Consultation Note  Patient ID: Stacey [name], MRN: [id number], DOB/AGE: 75-Oct-1942 75 y.o. Admit date: 10/22/2017   Date of Consult: 10/23/2017 Primary Physician: [name], MD Primary Cardiologist: [name]  Chief Complaint:  Chief Complaint  Patient presents with  . Loss of Vision   Reason for Consult: Stroke  HPI: 77 y.o. female with known coronary artery disease status post coronary artery bypass graft with essential hypertension mixed hyperlipidemia.  The patient last year was status post bypass surgery and had some prolonged issues with recovery with weakness fatigue but did Completely recover.  She did have some postoperative atrial fibrillation treated appropriately and completely resolved and continued to be in maintaining normal sinus rhythm for the last 6 months.  The patient has had some rehabilitation and improves her physical activity with appropriate medication management on board including amlodipine metoprolol for hypertension control and Crestor for cardiovascular disease and hyperlipidemia.  The patient awakened with some visual issues consistent with possible stroke and had a CT scan consistent with occipital stroke.  At the time there was not evidence of atrial fibrillation but currently the patient does have atrial fibrillation with controlled ventricular rate on telemetry consistent with her primary cause of stroke.  She has had some recovery and is hemodynamically stable Echocardiogram shows normal LV systolic function with moderate biatrial enlargement and mild to moderate mitral and tricuspid regurgitation unchanged from before ejection fraction of 55 to 60% Past Medical History:  Diagnosis Date  . Abnormal nuclear stress test 05/24/2017  . Arthritis   . Asthma   . Atrial fibrillation, transient ([place])   . 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 Cancer ([place])   . Complication of  anesthesia   . Depression   . Family history of adverse reaction to anesthesia    most of family - PONV  . Fibromyalgia   . Glaucoma   . Headache    migraines - 1-2x/mo  . HTN (hypertension) 03/09/2007  . Hyperlipidemia LDL goal <70 03/25/1993  . Hypertension   . Motion sickness    all moving vehicles  . PONV (postoperative nausea and vomiting)   . Thyroid disease       Surgical History:  Past Surgical History:  Procedure Laterality Date  . ABDOMINAL HYSTERECTOMY    . APPENDECTOMY    . BLADDER SURGERY    . CATARACT EXTRACTION W/ INTRAOCULAR LENS  IMPLANT, BILATERAL    . COLONOSCOPY WITH PROPOFOL N/A 01/17/2016   Procedure: COLONOSCOPY WITH PROPOFOL;  Surgeon: [name], MD;  Location: [place];  Service: Endoscopy;  Laterality: N/A;  . CORONARY ARTERY BYPASS GRAFT N/A 02/20/2017   Procedure: CORONARY ARTERY BYPASS GRAFTING (CABG) x , three using left internal mammary artery to left anterior descending coronary artery and right greater saphenous vein harvested endoscopically to distal right and diagonal coronary arteries.;  Surgeon: Grace Isaac, MD;  Location: [place];  Service: Open Heart Surgery;  Laterality: N/A;  . LEFT HEART CATH AND CORONARY ANGIOGRAPHY N/A 02/20/2017   Procedure: LEFT HEART CATH AND CORONARY ANGIOGRAPHY;  Surgeon: Isaias Cowman, MD;  Location: [place] [facility];  Service: Cardiovascular;  Laterality: N/A;  . LEFT HEART CATH AND CORS/GRAFTS ANGIOGRAPHY N/A 05/24/2017   Procedure: LEFT HEART CATH AND CORS/GRAFTS ANGIOGRAPHY;  Surgeon: Sherren Mocha, MD;  Location: [place] [facility];  Service: Cardiovascular;  Laterality: N/A;  . NASAL SINUS SURGERY    .  POLYPECTOMY  01/17/2016   Procedure: POLYPECTOMY;  Surgeon: [name], MD;  Location: [place];  Service: Endoscopy;;  . RIGHT OOPHORECTOMY    . TEE WITHOUT CARDIOVERSION N/A 02/20/2017   Procedure: TRANSESOPHAGEAL ECHOCARDIOGRAM (TEE);  Surgeon: Grace Isaac, MD;   Location: [place];  Service: Open Heart Surgery;  Laterality: N/A;     Home Meds: Prior to Admission medications   Medication Sig Start Date End Date Taking? Authorizing Provider  acetaminophen (TYLENOL) 325 MG tablet Take 2 tablets (650 mg total) by mouth every 6 (six) hours as needed for mild pain. 02/28/17  Yes Lars Pinks M, PA-C  albuterol (VENTOLIN HFA) 108 (90 Base) MCG/ACT inhaler INHALE 2 PUFFS BY MOUTH EVERY 4 TO 6 HOURS AS NEEDED FOR SHORTNESS OF BREATH 12/17/16  Yes Mar Daring, PA-C  amLODipine (NORVASC) 5 MG tablet Take 1 tablet (5 mg total) daily by mouth. 04/04/17  Yes [name], MD  aspirin 81 MG tablet ASPIRIN, 81MG  (Oral Tablet Delayed Release)  1 Every Day for 0 days  Quantity: 0.00;  Refills: 0   Ordered :17-November-2010  [name] ;  Started 05-Oct-2006 Active Comments: DX: 401.9 10/05/06  Yes [provider]  buPROPion (WELLBUTRIN XL) 300 MG 24 hr tablet Take 1 tablet (300 mg total) by mouth daily. 03/22/17  Yes [name], MD  capsaicin (ZOSTRIX) 0.025 % cream Apply 1 application topically as needed (nerve pain in chest).   Yes [provider]  Cholecalciferol (VITAMIN D) 2000 units tablet Take 2,000 Units by mouth daily.   Yes [provider]  Cyanocobalamin (B-12) 500 MCG TABS Take by mouth.   Yes [provider]  diazepam (VALIUM) 5 MG tablet Take 1 tablet (5 mg total) by mouth every 12 (twelve) hours as needed for anxiety. Patient taking differently: Take 5 mg by mouth daily as needed for anxiety.  08/30/17  Yes [name], MD  EPINEPHrine (EPIPEN 2-PAK) 0.3 mg/0.3 mL IJ SOAJ injection INJECT AS DIRECTED FOR SEVERE ALLERGIC REACTION 12/17/16  Yes Fenton Malling M, PA-C  levothyroxine (SYNTHROID, LEVOTHROID) 100 MCG tablet Take 1 tablet (100 mcg total) by mouth daily. 04/30/17  Yes [name], MD  lisinopril (PRINIVIL,ZESTRIL) 40 MG tablet Take 1 tablet (40 mg total) by mouth  daily. 04/27/17  Yes [name], MD  metoprolol tartrate (LOPRESSOR) 25 MG tablet Take 1 tablet (25 mg total) by mouth 2 (two) times daily. 05/24/17  Yes Barrett, Evelene Croon, PA-C  Multiple Vitamin (MULTI-VITAMINS) TABS Take 1 tablet by mouth daily.   Yes [provider]  oxymetazoline (AFRIN) 0.05 % nasal spray Place 1 spray into both nostrils as needed for congestion.   Yes [provider]  rosuvastatin (CRESTOR) 40 MG tablet Take 1 tablet (40 mg total) by mouth daily. Patient taking differently: Take 20 mg by mouth daily.  06/01/17  Yes [name], MD  traMADol (ULTRAM) 50 MG tablet Take 1 tablet (50 mg total) by mouth every 6 (six) hours as needed for moderate pain. 02/28/17  Yes Lars Pinks M, PA-C  zolpidem (AMBIEN) 5 MG tablet Take 1 tablet (5 mg total) by mouth at bedtime as needed. for sleep 06/01/17  Yes [name], MD    Inpatient Medications:  . amLODipine  5 mg Oral Daily  . aspirin EC  325 mg Oral Daily  . buPROPion  300 mg Oral Daily  . cholecalciferol  2,000 Units  Oral Daily  . enoxaparin (LOVENOX) injection  40 mg Subcutaneous Q24H  . levothyroxine  100 mcg Oral QAC breakfast  . metoprolol tartrate  25 mg Oral BID  . multivitamin with minerals  1 tablet Oral Daily  . rosuvastatin  20 mg Oral q1800     Allergies:  Allergies  Allergen Reactions  . Cephalexin Anaphylaxis  . Atorvastatin   . Morphine And Related Hives  . Novocain [Procaine]     Chest pain  . Shellfish Allergy Swelling    angioedema  . Statins Other (See Comments)    Muscle pain  . Tomato     (by testing) - also beans, wheat, peas  . Penicillins Hives and Rash    Social History   Socioeconomic History  . Marital status: Married    Spouse name: [name]  . Number of children: 3  . Years of education: Not on file  . Highest education level: Not on file  Occupational History    Employer: [employer] Needs  . Financial resource  strain: Not on file  . Food insecurity:    Worry: Not on file    Inability: Not on file  . Transportation needs:    Medical: Not on file    Non-medical: Not on file  Tobacco Use  . Smoking status: Former Smoker    Last attempt to quit: 05/26/1975    Years since quitting: 42.4  . Smokeless tobacco: Never Used  . Tobacco comment: quit 45  years ago   Substance and Sexual Activity  . Alcohol use: No  . Drug use: No  . Sexual activity: Never  Lifestyle  . Physical activity:    Days per week: Not on file    Minutes per session: Not on file  . Stress: Not on file  Relationships  . Social connections:    Talks on phone: Not on file    Gets together: Not on file    Attends religious service: Not on file    Active member of club or organization: Not on file    Attends meetings of clubs or organizations: Not on file    Relationship status: Not on file  . Intimate partner violence:    Fear of current or ex partner: Not on file    Emotionally abused: Not on file    Physically abused: Not on file    Forced sexual activity: Not on file  Other Topics Concern  . Not on file  Social History Narrative  . Not on file     Family History  Problem Relation Age of Onset  . Alzheimer's disease Mother   . Kidney cancer Mother   . Heart attack Father   . Pancreatic cancer Sister   . Heart attack Brother 68  . Ovarian cancer Sister   . Kidney disease Sister        dialysis  . Cancer Sister        uterin  . Diabetes Sister   . Heart attack Sister   . Heart attack Brother 22  . Ovarian cancer Sister      Review of Systems Positive for visual changes Negative for: General:  chills, fever, night sweats or weight changes.  Cardiovascular: PND orthopnea syncope dizziness  Dermatological skin lesions rashes Respiratory: Cough congestion Urologic: Frequent urination urination at night and hematuria Abdominal: negative for nausea, vomiting, diarrhea, bright red blood per rectum, melena,  or hematemesis Neurologic: Positive for visual changes, and negative for hearing changes  All other systems reviewed and are otherwise negative except as noted above.  Labs: Recent Labs    10/22/17 1507  TROPONINI <0.03   Lab Results  Component Value Date   WBC 7.7 10/22/2017   HGB 14.3 10/22/2017   HCT 42.9 10/22/2017   MCV 86.4 10/22/2017   PLT 155 10/22/2017    Recent Labs  Lab 10/22/17 1507  NA 135  K 4.0  CL 98*  CO2 26  BUN 18  CREATININE 0.87  CALCIUM 9.1  PROT 7.4  BILITOT 0.8  ALKPHOS 81  ALT 20  AST 27  GLUCOSE 118*   Lab Results  Component Value Date   CHOL 137 10/23/2017   HDL 45 10/23/2017   LDLCALC 68 10/23/2017   TRIG 119 10/23/2017   No results found for: DDIMER  Radiology/Studies:  Dg Chest 2 View  Result Date: 10/22/2017 CLINICAL DATA:  Cough for 1 day.  Ex-smoker. EXAM: CHEST - 2 VIEW COMPARISON:  05/22/2017. FINDINGS: Borderline enlarged cardiac silhouette with an interval decrease in size. Stable post CABG changes. Stable mild diffuse peribronchial thickening. Clear lungs with normal vascularity. Diffuse osteopenia. Atheromatous aortic calcifications. IMPRESSION: No acute abnormality.  Stable mild chronic bronchitic changes. Electronically Signed   By: Claudie Revering M.D.   On: 10/22/2017 16:25   Mr Brain Wo Contrast  Result Date: 10/23/2017 CLINICAL DATA:  77 y/o  F; abnormal vision changes. EXAM: MRI HEAD WITHOUT CONTRAST TECHNIQUE: Multiplanar, multiecho pulse sequences of the brain and surrounding structures were obtained without intravenous contrast. COMPARISON:  10/22/2017 CT head FINDINGS: Brain: Small focus of reduced diffusion within the right paramedian parietooccipital junction compatible with acute/early subacute infarction. No associated hemorrhage or mass effect. The infarct is stable in size in comparison with prior CT of the head given differences in technique. Few nonspecific foci of T2 FLAIR hyperintense signal abnormality in  subcortical and periventricular white matter are compatible with mild chronic microvascular ischemic changes for age. No extra-axial collection, hydrocephalus, focal mass effect, or herniation. Vascular: Normal flow voids. Skull and upper cervical spine: Normal marrow signal. Sinuses/Orbits: Negative. Other: None. IMPRESSION: 1. Acute/early subacute infarction within the right parieto-occipital junction is stable in distribution in comparison with prior CT of the head. No associated hemorrhage or mass effect. 2. Mild chronic microvascular ischemic changes of the brain. Electronically Signed   By: Kristine Garbe M.D.   On: 10/23/2017 01:57   US Carotid Bilateral (at [facility] Only)  Result Date: 10/23/2017 CLINICAL DATA:  CVA. EXAM: BILATERAL CAROTID DUPLEX ULTRASOUND TECHNIQUE: Pearline Cables scale imaging, color Doppler and duplex ultrasound were performed of bilateral carotid and vertebral arteries in the neck. COMPARISON:  None. FINDINGS: Criteria: Quantification of carotid stenosis is based on velocity parameters that correlate the residual internal carotid diameter with NASCET-based stenosis levels, using the diameter of the distal internal carotid lumen as the denominator for stenosis measurement. The following velocity measurements were obtained: RIGHT ICA:  97/35 cm/sec CCA:  80/99 cm/sec SYSTOLIC ICA/CCA RATIO:  1.3 ECA:  89 cm/sec LEFT ICA:  87/35 cm/sec CCA:  83/38 cm/sec SYSTOLIC ICA/CCA RATIO:  1.2 ECA:  79 cm/sec RIGHT CAROTID ARTERY: There is a minimal amount of eccentric echogenic plaque involving the origin and proximal aspects of the right internal carotid artery (image 22), not resulting in elevated peak systolic velocities within the interrogated course of the right internal carotid artery to suggest a hemodynamically significant stenosis. RIGHT VERTEBRAL ARTERY:  Antegrade flow LEFT CAROTID ARTERY: There is no grayscale evidence  of significant intimal thickening or atherosclerotic plaque  affecting interrogated portions of the left carotid system. There are no elevated peak systolic velocities within the interrogated course the left internal carotid artery to suggest a hemodynamically significant stenosis. LEFT VERTEBRAL ARTERY:  Antegrade flow IMPRESSION: 1. Minimal amount of right-sided atherosclerotic plaque, not resulting in a hemodynamically significant stenosis within the right internal carotid artery. 2. Normal sonographic evaluation of the left carotid system. Electronically Signed   By: Sandi Mariscal M.D.   On: 10/23/2017 09:03   Ct Head Code Stroke Wo Contrast  Result Date: 10/22/2017 CLINICAL DATA:  Code stroke. Right-sided vision loss since this morning EXAM: CT HEAD WITHOUT CONTRAST TECHNIQUE: Contiguous axial images were obtained from the base of the skull through the vertex without intravenous contrast. COMPARISON:  04/16/2014 FINDINGS: Brain: Small focus of cytotoxic type low density in right occipital lobe, just below the parieto-occipital sulcus. Anticipate MRI confirmation. Negative for hemorrhage. No hydrocephalus or masslike finding. Vascular: No high-density vessel.  Mild calcification. Skull: No acute finding Sinuses/Orbits: Bilateral cataract resection These results were called by telephone at the time of interpretation on 10/22/2017 at 3:02 pm to Dr. Harvest Dark , who verbally acknowledged these results. ASPECTS [facility] 352 Stroke Program Early CT Score) Not scored with this history IMPRESSION: Small acute infarct in the right occipital lobe. Electronically Signed   By: Monte Fantasia M.D.   On: 10/22/2017 15:02    EKG: Atrial fibrillation with controlled ventricular rate nonspecific ST changes  Weights: Filed Weights   10/22/17 1510  Weight: 160 lb (72.6 kg)     Physical Exam: Blood pressure 109/86, pulse 94, temperature 98.1 F (36.7 C), temperature source Oral, resp. rate 18, height 5\' 2"  (1.575 m), weight 160 lb (72.6 kg), SpO2 96 %. Body mass index is  29.26 kg/m. General: Well developed, well nourished, in no acute distress. Head eyes ears nose throat: Normocephalic, atraumatic, sclera non-icteric, no xanthomas, nares are without discharge. No apparent thyromegaly and/or mass  Lungs: Normal respiratory effort.  no wheezes, no rales, no rhonchi.  Heart: Irregular with normal S1 S2. no murmur gallop, no rub, PMI is normal size and placement, carotid upstroke normal without bruit, jugular venous pressure is normal Abdomen: Soft, non-tender, non-distended with normoactive bowel sounds. No hepatomegaly. No rebound/guarding. No obvious abdominal masses. Abdominal aorta is normal size without bruit Extremities: No edema. no cyanosis, no clubbing, no ulcers  Peripheral : 2+ bilateral upper extremity pulses, 2+ bilateral femoral pulses, 2+ bilateral dorsal pedal pulse Neuro: Alert and oriented. No facial asymmetry. No focal deficit. Moves all extremities spontaneously.  Positive for visual issues Musculoskeletal: Normal muscle tone without kyphosis Psych:  Responds to questions appropriately with a normal affect.    Assessment: 77 year old female with acute embolic stroke and peripheral vascular disease with coronary disease and hypertension and hyperlipidemia needing further medical management  Plan: 1.  Continue heparin for acute stroke likely embolic in phenomenon 2.  Begin Eliquis 5 mg twice per day for further risk reduction in the future stroke with atrial fibrillation 3.  Consideration of low-dose aspirin of 81 mg for other peripheral vascular disease concerns and coronary artery disease concerns touching for bleeding and bruising 4.  No change in metoprolol for heart rate control of atrial fibrillation 5.  To new metoprolol amlodipine for hypertension control 6.  Continue high intensity cholesterol therapy 7.  No further cardiac diagnostics necessary 8.  Continue rehabilitation and okay for discharge home from cardiac standpoint when  recovered with follow-up  next week  Signed, Corey Skains M.D. [facility] 10/23/2017, 9:37 AM

## 2017-10-23 NOTE — Plan of Care (Signed)
  Problem: Spiritual Needs Goal: Ability to function at adequate level Outcome: Progressing   Problem: Education: Goal: Knowledge of disease or condition will improve Outcome: Progressing Goal: Knowledge of secondary prevention will improve Outcome: Progressing Goal: Knowledge of patient specific risk factors addressed and post discharge goals established will improve Outcome: Progressing   Problem: Coping: Goal: Will verbalize positive feelings about self Outcome: Progressing Goal: Will identify appropriate support needs Outcome: Progressing   Problem: Health Behavior/Discharge Planning: Goal: Ability to manage health-related needs will improve Outcome: Progressing   Problem: Self-Care: Goal: Ability to participate in self-care as condition permits will improve Outcome: Progressing Goal: Verbalization of feelings and concerns over difficulty with self-care will improve Outcome: Progressing Goal: Ability to communicate needs accurately will improve Outcome: Progressing   Problem: Nutrition: Goal: Risk of aspiration will decrease Outcome: Progressing   Problem: Ischemic Stroke/TIA Tissue Perfusion: Goal: Complications of ischemic stroke/TIA will be minimized Outcome: Progressing   Problem: Education: Goal: Knowledge of General Education information will improve Outcome: Progressing   Problem: Health Behavior/Discharge Planning: Goal: Ability to manage health-related needs will improve Outcome: Progressing   Problem: Clinical Measurements: Goal: Ability to maintain clinical measurements within normal limits will improve Outcome: Progressing Goal: Will remain free from infection Outcome: Progressing Goal: Diagnostic test results will improve Outcome: Progressing Goal: Respiratory complications will improve Outcome: Progressing Goal: Cardiovascular complication will be avoided Outcome: Progressing   Problem: Nutrition: Goal: Adequate nutrition will be  maintained Outcome: Progressing   Problem: Coping: Goal: Level of anxiety will decrease Outcome: Progressing   Problem: Elimination: Goal: Will not experience complications related to bowel motility Outcome: Progressing Goal: Will not experience complications related to urinary retention Outcome: Progressing   Problem: Pain Managment: Goal: General experience of comfort will improve Outcome: Progressing   Problem: Safety: Goal: Ability to remain free from injury will improve Outcome: Progressing   Problem: Skin Integrity: Goal: Risk for impaired skin integrity will decrease Outcome: Progressing

## 2017-10-23 NOTE — Evaluation (Signed)
Occupational Therapy Evaluation Patient Details Name: Stacey [name] MRN: [id number] DOB: [birth year] Today's Date: 10/23/2017    History of Present Illness pt is a 77 y.o F addmitted on 10/22/2017 with dx of CVA. she has a hx of CABG and cancer. imaging on 6/1 that revealed subacute infarction within the right and Mild chronic microvascular ischemic changes of the brain.   Clinical Impression    Pt was laying in bed and husband in chair - report she had some visual issues yesterday and then come to hospital - had busy night - showed had stroke - feels tired but otherwise doing okay .  Pt is A&O x 4 and currently reports  Having her usual pain in back but seeing PT over at [address] now for a few months. Pt bilateral UE show decrease strength but pt reluctant to reach or resist because of back pain history . Bilateral coordination in hands appear WNL and denies any numbness- do show visual field cut in L lower quadrant but pt and husband report that is better from yesterday when it was to whole L side  She was I with bed mobility and Supervision to bathroom  With on and off toilet - no LOB - LB dressing on EOB Independent with not LOB - pt do not need any OT intervention at this time  Follow Up Recommendations   Out pt PT     Equipment Recommendations       Recommendations for Other Services       Precautions / Restrictions Precautions Precautions: None Restrictions Weight Bearing Restrictions: No      Mobility Bed Mobility Overal bed mobility: Independent                Transfers Overall transfer level: Independent Equipment used: None                  Balance Overall balance assessment: Independent                                         ADL either performed or assessed with clinical judgement   ADL                                               Vision   Vision Assessment?: Yes Additional Comments:  L lower  quadrant field cut      Perception     Praxis      Pertinent Vitals/Pain Pain Assessment: (no number given)      Hand Dominance Right   Extremity/Trunk Assessment Upper Extremity Assessment Upper Extremity Assessment: Overall WFL for tasks assessed   L    Communication Communication Communication: No difficulties   Cognition Arousal/Alertness: Awake/alert Behavior During Therapy: WFL for tasks assessed/performed Overall Cognitive Status: Within Functional Limits for tasks assessed                                     General Comments       Exercises   Shoulder Instructions      Home Living Family/patient expects to be discharged to:: Private residence Living Arrangements: Spouse/significant other Available Help at Discharge: Family;Available 24 hours/day Type of Home: [place]  Access: Stairs to enter [employer] of Steps: 2 Entrance Stairs-Rails: None Home Layout: Two level;1/2 bath on main level;Able to live on main level with bedroom/bathroom Alternate Level Stairs-Number of Steps: flight Alternate Level Stairs-Rails: Right Bathroom Shower/Tub: Tub/shower unit(but chair to sit in )   Bathroom Toilet: Standard Bathroom Accessibility: Yes   Home Equipment: Hospital bed;Bedside commode          Prior Functioning/Environment Level of Independence: Independent                 OT Problem List:        OT Treatment/Interventions:      OT Goals(Current goals can be found in the care plan section) Acute Rehab OT Goals Patient Stated Goal: to go home  OT Frequency:     Barriers to D/C:            Co-evaluation              AM-PAC PT "6 Clicks" Daily Activity     Outcome Measure                 End of Session    Activity Tolerance:   Patient left:                     Time: 0940-1010 OT Time Calculation (min): 30 min Charges:  OT Evaluation $OT Eval Low Complexity: 1 Low G-Codes:       Carlous Olivares OTR/L,CLT 10/23/2017, 12:11 PM

## 2017-10-24 LAB — CBC
HCT: 42.3 % (ref 35.0–47.0)
HEMOGLOBIN: 14.2 g/dL (ref 12.0–16.0)
MCH: 29.3 pg (ref 26.0–34.0)
MCHC: 33.6 g/dL (ref 32.0–36.0)
MCV: 87.3 fL (ref 80.0–100.0)
Platelets: 146 10*3/uL — ABNORMAL LOW (ref 150–440)
RBC: 4.85 MIL/uL (ref 3.80–5.20)
RDW: 13.8 % (ref 11.5–14.5)
WBC: 4.7 10*3/uL (ref 3.6–11.0)

## 2017-10-24 MED ORDER — ROSUVASTATIN CALCIUM 40 MG PO TABS: 20.0000 mg | ORAL_TABLET | Freq: Every day | ORAL | 2 refills | Status: DC

## 2017-10-24 MED ORDER — APIXABAN 5 MG PO TABS: 5.0000 mg | ORAL_TABLET | Freq: Two times a day (BID) | ORAL | 2 refills | Status: AC

## 2017-10-24 MED ORDER — ASPIRIN 81 MG PO TABS: 81.0000 mg | ORAL_TABLET | Freq: Every day | ORAL | 2 refills | Status: AC

## 2017-10-24 MED ORDER — LISINOPRIL 10 MG PO TABS
10.0000 mg | ORAL_TABLET | Freq: Every day | ORAL | 2 refills | Status: DC
Start: 2017-10-24 — End: 2018-04-06

## 2017-10-24 NOTE — Discharge Summary (Signed)
[facility] [place] NAME: Stacey [name]    MR#:  [id number]  DATE OF BIRTH:  22-Feb-1941  DATE OF ADMISSION:  10/22/2017   ADMITTING PHYSICIAN: Loletha Grayer, MD  DATE OF DISCHARGE: 10/24/2017  PRIMARY CARE PHYSICIAN: [name], MD   ADMISSION DIAGNOSIS:   Cough [R05] CVA (cerebral vascular accident) ([place]) [I63.9] Occipital stroke ([place]) [I63.9]  DISCHARGE DIAGNOSIS:   Active Problems:   CVA (cerebral vascular accident) ([place])   SECONDARY DIAGNOSIS:   Past Medical History:  Diagnosis Date  . Abnormal nuclear stress test 05/24/2017  . Arthritis   . Asthma   . Atrial fibrillation, transient ([place])   . 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 Cancer ([place])   . Complication of anesthesia   . Depression   . Family history of adverse reaction to anesthesia    most of family - PONV  . Fibromyalgia   . Glaucoma   . Headache    migraines - 1-2x/mo  . HTN (hypertension) 03/09/2007  . Hyperlipidemia LDL goal <70 03/25/1993  . Hypertension   . Motion sickness    all moving vehicles  . PONV (postoperative nausea and vomiting)   . Thyroid disease     HOSPITAL COURSE:   77 year old female with past medical history significant for CAD status post CABG, asthma, transient atrial fibrillation not on anticoagulation at home presents to hospital secondary to left hemianopsia  1.  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  MRI consistent with embolic infarct -Appreciate neurology and cardiology consults. -started on Eliquis with history of transient atrial fibrillation -Continue statin -PT/OT consults -carotid Dopplers with no hemodynamically significant stenosis.  Echocardiogram done and no embolic source of stroke seen  2.  History of CAD-CABG in October 2018 -[facility] cardiology consult.  Continue aspirin and  statin -Eliquis added.  3.  Hypertension-continue home medications.  Patient on Norvasc and metoprolol and low dose lisinopril  4.  Hypothyroidism-on Synthroid   Worked with physical therapy, they have recommended outpatient follow-up    DISCHARGE CONDITIONS:   Guarded  CONSULTS OBTAINED:   Treatment Team:  Corey Skains, MD  DRUG ALLERGIES:   Allergies  Allergen Reactions  . Cephalexin Anaphylaxis  . Atorvastatin   . Morphine And Related Hives  . Novocain [Procaine]     Chest pain  . Shellfish Allergy Swelling    angioedema  . Statins Other (See Comments)    Muscle pain  . Tomato     (by testing) - also beans, wheat, peas  . Penicillins Hives and Rash   DISCHARGE MEDICATIONS:   Allergies as of 10/24/2017      Reactions   Cephalexin Anaphylaxis   Atorvastatin    Morphine And Related Hives   Novocain [procaine]    Chest pain   Shellfish Allergy Swelling   angioedema   Statins Other (See Comments)   Muscle pain   Tomato    (by testing) - also beans, wheat, peas   Penicillins Hives, Rash      Medication List    TAKE these medications   acetaminophen 325 MG tablet Commonly known as:  TYLENOL Take 2 tablets (650 mg total) by mouth every 6 (six) hours as needed for mild pain.   albuterol 108 (90 Base) MCG/ACT inhaler Commonly known as:  VENTOLIN HFA INHALE 2 PUFFS BY MOUTH EVERY 4 TO 6  HOURS AS NEEDED FOR SHORTNESS OF BREATH   amLODipine 5 MG tablet Commonly known as:  NORVASC Take 1 tablet (5 mg total) daily by mouth.   apixaban 5 MG Tabs tablet Commonly known as:  ELIQUIS Take 1 tablet (5 mg total) by mouth 2 (two) times daily.   aspirin 81 MG tablet ASPIRIN, 81MG  (Oral Tablet Delayed Release)  1 Every Day for 0 days  Quantity: 0.00;  Refills: 0   Ordered :17-November-2010  [name] ;  Started 05-Oct-2006 Active Comments: DX: 401.9   B-12 500 MCG Tabs Take by mouth.   buPROPion 300 MG 24 hr tablet Commonly known as:  WELLBUTRIN  XL Take 1 tablet (300 mg total) by mouth daily.   capsaicin 0.025 % cream Commonly known as:  ZOSTRIX Apply 1 application topically as needed (nerve pain in chest).   diazepam 5 MG tablet Commonly known as:  VALIUM Take 1 tablet (5 mg total) by mouth every 12 (twelve) hours as needed for anxiety. What changed:  when to take this   EPINEPHrine 0.3 mg/0.3 mL Soaj injection Commonly known as:  EPIPEN 2-PAK INJECT AS DIRECTED FOR SEVERE ALLERGIC REACTION   levothyroxine 100 MCG tablet Commonly known as:  SYNTHROID, LEVOTHROID Take 1 tablet (100 mcg total) by mouth daily.   lisinopril 10 MG tablet Commonly known as:  PRINIVIL,ZESTRIL Take 1 tablet (10 mg total) by mouth daily. What changed:    medication strength  how much to take   metoprolol tartrate 25 MG tablet Commonly known as:  LOPRESSOR Take 1 tablet (25 mg total) by mouth 2 (two) times daily.   MULTI-VITAMINS Tabs Take 1 tablet by mouth daily.   oxymetazoline 0.05 % nasal spray Commonly known as:  AFRIN Place 1 spray into both nostrils as needed for congestion.   rosuvastatin 40 MG tablet Commonly known as:  CRESTOR Take 0.5 tablets (20 mg total) by mouth daily.   traMADol 50 MG tablet Commonly known as:  ULTRAM Take 1 tablet (50 mg total) by mouth every 6 (six) hours as needed for moderate pain.   Vitamin D 2000 units tablet Take 2,000 Units by mouth daily.   zolpidem 5 MG tablet Commonly known as:  AMBIEN Take 1 tablet (5 mg total) by mouth at bedtime as needed. for sleep        DISCHARGE INSTRUCTIONS:   1.  PCP follow-up in 1 to 2 weeks 2.  Outpatient physical therapy and occupational therapy  DIET:   Cardiac diet  ACTIVITY:   Activity as tolerated  OXYGEN:   Home Oxygen: No.  Oxygen Delivery: room air  DISCHARGE LOCATION:   home   If you experience worsening of your admission symptoms, develop shortness of breath, life threatening emergency, suicidal or homicidal thoughts you  must seek medical attention immediately by calling 911 or calling your MD immediately  if symptoms less severe.  You Must read complete instructions/literature along with all the possible adverse reactions/side effects for all the Medicines you take and that have been prescribed to you. Take any new Medicines after you have completely understood and accpet all the possible adverse reactions/side effects.   Please note  You were cared for by a hospitalist during your hospital stay. If you have any questions about your discharge medications or the care you received while you were in the hospital after you are discharged, you can call the unit and asked to speak with the hospitalist on call if the hospitalist that took care of  you is not available. Once you are discharged, your primary care physician will handle any further medical issues. Please note that NO REFILLS for any discharge medications will be authorized once you are discharged, as it is imperative that you return to your primary care physician (or establish a relationship with a primary care physician if you do not have one) for your aftercare needs so that they can reassess your need for medications and monitor your lab values.    On the day of Discharge:  VITAL SIGNS:   Blood pressure 125/83, pulse 76, temperature 97.9 F (36.6 C), temperature source Oral, resp. rate 16, height 5\' 2"  (1.575 m), weight 72.6 kg (160 lb), SpO2 98 %.  PHYSICAL EXAMINATION:   GENERAL:  77 y.o.-year-old patient lying in the bed with no acute distress.  EYES: Pupils equal, round, reactive to light and accommodation. No scleral icterus. Extraocular muscles intact.  HEENT: Head atraumatic, normocephalic. Oropharynx and nasopharynx clear.  NECK:  Supple, no jugular venous distention. No thyroid enlargement, no tenderness.  LUNGS: Normal breath sounds bilaterally, no wheezing, rales,rhonchi or crepitation. No use of accessory muscles of respiration.   CARDIOVASCULAR: S1, S2 normal. No  rubs, or gallops.  2/6 systolic murmur is present.  Bypass surgery scar present. ABDOMEN: Soft, nontender, nondistended. Bowel sounds present. No organomegaly or mass.  EXTREMITIES: No pedal edema, cyanosis, or clubbing.  NEUROLOGIC: Cranial nerves II through XII are intact except left hemianopsia present.  Muscle strength 5/5 in all extremities. Sensation intact. Gait not checked.  PSYCHIATRIC: The patient is alert and oriented x 3.  SKIN: No obvious rash, lesion, or ulcer.    DATA REVIEW:   CBC Recent Labs  Lab 10/24/17 0416  WBC 4.7  HGB 14.2  HCT 42.3  PLT 146*    Chemistries  Recent Labs  Lab 10/22/17 1507  NA 135  K 4.0  CL 98*  CO2 26  GLUCOSE 118*  BUN 18  CREATININE 0.87  CALCIUM 9.1  AST 27  ALT 20  ALKPHOS 81  BILITOT 0.8     Microbiology Results  Results for orders placed or performed in visit on 09/16/17  Urine Culture     Status: Abnormal   Collection Time: 09/16/17  8:33 AM  Result Value Ref Range Status   Urine Culture, Routine Final report (A)  Final   Organism ID, Bacteria Escherichia coli (A)  Final    Comment: 10,000-25,000 colony forming units per mL Cefazolin <=4 ug/mL Cefazolin with an MIC <=16 predicts susceptibility to the oral agents cefaclor, cefdinir, cefpodoxime, cefprozil, cefuroxime, cephalexin, and loracarbef when used for therapy of uncomplicated urinary tract infections due to E. coli, Klebsiella pneumoniae, and Proteus mirabilis.    Antimicrobial Susceptibility Comment  Final    Comment:       ** S = Susceptible; I = Intermediate; R = Resistant **                    P = Positive; N = Negative             MICS are expressed in micrograms per mL    Antibiotic                 RSLT#1    RSLT#2    RSLT#3    RSLT#4 Amoxicillin/Clavulanic Acid    S Ampicillin                     R Cefepime  S Ceftriaxone                    S Cefuroxime                      S Ciprofloxacin                  S Ertapenem                      S Gentamicin                     S Imipenem                       S Levofloxacin                   S Meropenem                      S Nitrofurantoin                 S Piperacillin/Tazobactam        S Tetracycline                   S Tobramycin                     S Trimethoprim/Sulfa             S   Microscopic Examination     Status: Abnormal   Collection Time: 09/16/17  8:33 AM  Result Value Ref Range Status   WBC, UA 6-10 (A) 0 - 5 /hpf Final   RBC, UA >30 (A) 0 - 2 /hpf Final   Epithelial Cells (non renal) None seen 0 - 10 /hpf Final   Casts None seen None seen /lpf Final   Mucus, UA Present Not Estab. Final   Bacteria, UA Few None seen/Few Final    RADIOLOGY:  No results found.   Management plans discussed with the patient, family and they are in agreement.  CODE STATUS:     Code Status Orders  (From admission, onward)        Start     Ordered   10/22/17 1545  Full code  Continuous     10/22/17 1546    Code Status History    Date Active Date Inactive Code Status Order ID Comments User Context   05/22/2017 2044 05/24/2017 2139 Full Code [id number]  Isaiah Serge, NP Inpatient   02/20/2017 1909 02/28/2017 1748 Full Code [id number]  Marcene Corning Inpatient   02/20/2017 0310 02/20/2017 1237 Full Code [id number]  Saundra Shelling, MD Inpatient      TOTAL TIME TAKING CARE OF THIS PATIENT: 38 minutes.    Gladstone Lighter M.D on 10/24/2017 at 10:41 AM  Between 7am to 6pm - Pager - [phone]  After 6pm go to [email or web address] - password [email or web address] [facility]  [employer] [place] [facility]  Office  [phone]  CC: Primary care physician; [name], MD   Note: This dictation was prepared with Dragon dictation along with smaller phrase technology. Any transcriptional errors that result from this process are unintentional.

## 2017-10-24 NOTE — Progress Notes (Signed)
Patient discharged with husband, patient verbalized understanding of education. Patient with no complaints.

## 2017-10-24 NOTE — Progress Notes (Signed)
[facility] Encounter Note  Patient: Stacey [name] / Admit Date: 10/22/2017 / Date of Encounter: 10/24/2017, 10:44 AM   Subjective: Vision is improved.  No other neurologic significant symptoms.  Heart rate controlled.  No cardiovascular symptoms from status post previous coronary bypass graft. Echocardiogram showing normal LV systolic function with ejection fraction of 55%  Review of Systems: Positive for: Visual issues Negative for:  hearing change, syncope, dizziness, nausea, vomiting,diarrhea, bloody stool, stomach pain, cough, congestion, diaphoresis, urinary frequency, urinary pain,skin lesions, skin rashes Others previously listed  Objective: Telemetry: Atrial fibrillation with controlled ventricular rate Physical Exam: Blood pressure 125/83, pulse 76, temperature 97.9 F (36.6 C), temperature source Oral, resp. rate 16, height 5\' 2"  (1.575 m), weight 160 lb (72.6 kg), SpO2 98 %. Body mass index is 29.26 kg/m. General: Well developed, well nourished, in no acute distress. Head: Normocephalic, atraumatic, sclera non-icteric, no xanthomas, nares are without discharge. Neck: No apparent masses Lungs: Normal respirations with no wheezes, no rhonchi, no rales , no crackles   Heart: Irregular rate and rhythm, normal S1 S2, no murmur, no rub, no gallop, PMI is normal size and placement, carotid upstroke normal without bruit, jugular venous pressure normal Abdomen: Soft, non-tender, non-distended with normoactive bowel sounds. No hepatosplenomegaly. Abdominal aorta is normal size without bruit Extremities: No edema, no clubbing, no cyanosis, no ulcers,  Peripheral: 2+ radial, 2+ femoral, 2+ dorsal pedal pulses Neuro: Alert and oriented. Moves all extremities spontaneously. Psych:  Responds to questions appropriately with a normal affect.   Intake/Output Summary (Last 24 hours) at 10/24/2017 1044 Last data filed at 10/24/2017 0900 Gross per 24 hour  Intake 600 ml   Output -  Net 600 ml    Inpatient Medications:  . amLODipine  5 mg Oral Daily  . apixaban  5 mg Oral BID  . aspirin EC  325 mg Oral Daily  . buPROPion  300 mg Oral Daily  . cholecalciferol  2,000 Units Oral Daily  . levothyroxine  100 mcg Oral QAC breakfast  . metoprolol tartrate  25 mg Oral BID  . multivitamin with minerals  1 tablet Oral Daily  . rosuvastatin  20 mg Oral q1800   Infusions:   Labs: Recent Labs    10/22/17 1507  NA 135  K 4.0  CL 98*  CO2 26  GLUCOSE 118*  BUN 18  CREATININE 0.87  CALCIUM 9.1   Recent Labs    10/22/17 1507  AST 27  ALT 20  ALKPHOS 81  BILITOT 0.8  PROT 7.4  ALBUMIN 4.4   Recent Labs    10/22/17 1507 10/24/17 0416  WBC 7.7 4.7  NEUTROABS 6.1  --   HGB 14.3 14.2  HCT 42.9 42.3  MCV 86.4 87.3  PLT 155 146*   Recent Labs    10/22/17 1507  TROPONINI <0.03   Invalid input(s): POCBNP Recent Labs    10/22/17 1507  HGBA1C 5.4     Weights: Filed Weights   10/22/17 1510  Weight: 160 lb (72.6 kg)     Radiology/Studies:  Dg Chest 2 View  Result Date: 10/22/2017 CLINICAL DATA:  Cough for 1 day.  Ex-smoker. EXAM: CHEST - 2 VIEW COMPARISON:  05/22/2017. FINDINGS: Borderline enlarged cardiac silhouette with an interval decrease in size. Stable post CABG changes. Stable mild diffuse peribronchial thickening. Clear lungs with normal vascularity. Diffuse osteopenia. Atheromatous aortic calcifications. IMPRESSION: No acute abnormality.  Stable mild chronic bronchitic changes. Electronically Signed   By: Claudie Revering  M.D.   On: 10/22/2017 16:25   Mr Brain Wo Contrast  Result Date: 10/23/2017 CLINICAL DATA:  77 y/o  F; abnormal vision changes. EXAM: MRI HEAD WITHOUT CONTRAST TECHNIQUE: Multiplanar, multiecho pulse sequences of the brain and surrounding structures were obtained without intravenous contrast. COMPARISON:  10/22/2017 CT head FINDINGS: Brain: Small focus of reduced diffusion within the right paramedian  parietooccipital junction compatible with acute/early subacute infarction. No associated hemorrhage or mass effect. The infarct is stable in size in comparison with prior CT of the head given differences in technique. Few nonspecific foci of T2 FLAIR hyperintense signal abnormality in subcortical and periventricular white matter are compatible with mild chronic microvascular ischemic changes for age. No extra-axial collection, hydrocephalus, focal mass effect, or herniation. Vascular: Normal flow voids. Skull and upper cervical spine: Normal marrow signal. Sinuses/Orbits: Negative. Other: None. IMPRESSION: 1. Acute/early subacute infarction within the right parieto-occipital junction is stable in distribution in comparison with prior CT of the head. No associated hemorrhage or mass effect. 2. Mild chronic microvascular ischemic changes of the brain. Electronically Signed   By: Kristine Garbe M.D.   On: 10/23/2017 01:57   US Carotid Bilateral (at [facility] Only)  Result Date: 10/23/2017 CLINICAL DATA:  CVA. EXAM: BILATERAL CAROTID DUPLEX ULTRASOUND TECHNIQUE: Pearline Cables scale imaging, color Doppler and duplex ultrasound were performed of bilateral carotid and vertebral arteries in the neck. COMPARISON:  None. FINDINGS: Criteria: Quantification of carotid stenosis is based on velocity parameters that correlate the residual internal carotid diameter with NASCET-based stenosis levels, using the diameter of the distal internal carotid lumen as the denominator for stenosis measurement. The following velocity measurements were obtained: RIGHT ICA:  97/35 cm/sec CCA:  54/65 cm/sec SYSTOLIC ICA/CCA RATIO:  1.3 ECA:  89 cm/sec LEFT ICA:  87/35 cm/sec CCA:  03/54 cm/sec SYSTOLIC ICA/CCA RATIO:  1.2 ECA:  79 cm/sec RIGHT CAROTID ARTERY: There is a minimal amount of eccentric echogenic plaque involving the origin and proximal aspects of the right internal carotid artery (image 22), not resulting in elevated peak  systolic velocities within the interrogated course of the right internal carotid artery to suggest a hemodynamically significant stenosis. RIGHT VERTEBRAL ARTERY:  Antegrade flow LEFT CAROTID ARTERY: There is no grayscale evidence of significant intimal thickening or atherosclerotic plaque affecting interrogated portions of the left carotid system. There are no elevated peak systolic velocities within the interrogated course the left internal carotid artery to suggest a hemodynamically significant stenosis. LEFT VERTEBRAL ARTERY:  Antegrade flow IMPRESSION: 1. Minimal amount of right-sided atherosclerotic plaque, not resulting in a hemodynamically significant stenosis within the right internal carotid artery. 2. Normal sonographic evaluation of the left carotid system. Electronically Signed   By: Sandi Mariscal M.D.   On: 10/23/2017 09:03   Ct Head Code Stroke Wo Contrast  Result Date: 10/22/2017 CLINICAL DATA:  Code stroke. Right-sided vision loss since this morning EXAM: CT HEAD WITHOUT CONTRAST TECHNIQUE: Contiguous axial images were obtained from the base of the skull through the vertex without intravenous contrast. COMPARISON:  04/16/2014 FINDINGS: Brain: Small focus of cytotoxic type low density in right occipital lobe, just below the parieto-occipital sulcus. Anticipate MRI confirmation. Negative for hemorrhage. No hydrocephalus or masslike finding. Vascular: No high-density vessel.  Mild calcification. Skull: No acute finding Sinuses/Orbits: Bilateral cataract resection These results were called by telephone at the time of interpretation on 10/22/2017 at 3:02 pm to Dr. Harvest Dark , who verbally acknowledged these results. ASPECTS [facility] Stroke Program Early CT Score) Not scored  with this history IMPRESSION: Small acute infarct in the right occipital lobe. Electronically Signed   By: Monte Fantasia M.D.   On: 10/22/2017 15:02     Assessment and Recommendation  77 y.o. 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  Continue metoprolol for heart rate control and maintenance of normal sinus rhythm with adjustments of dosage as an outpatient 2.  Hypertension control with amlodipine 3.  High intensity cholesterol therapy 4.  Aspirin for antiplatelet medication management for vascular and coronary disease 5.  Eliquis 5 mg twice per day for embolic stroke and atrial fibrillation 6.  Ambulate and okay for discharged home from cardiac standpoint  Signed, Serafina Royals M.D. FACC

## 2017-10-25 ENCOUNTER — Telehealth: Payer: Self-pay

## 2017-10-25 NOTE — Telephone Encounter (Signed)
Please call pt's husband at [phone]  Thanks teri

## 2017-10-25 NOTE — Telephone Encounter (Signed)
Husband NOT listed on DPR. Per wife: ok to speak with husband. -------------------  Transition Care Management Follow-Up Telephone Call   Date discharged and where: [facility] on 10/24/17.  How have you been since you were released from the hospital? Per husband pt is doing fine. Pt has had some problems adjusting to lack of eye site in left eye. Left cheek is tingling and they think the feeling is coming back. No other s/s currently.  Any patient concerns? Reduced Lisinopril from 40 to 10 mg and added Eliquis 5 mg BID. Is that ok?  Items Reviewed:   Meds: verified  Allergies: verified  Dietary Changes Reviewed: low sodium heart healthy  Functional Questionnaire:  Independent-I Dependent-D  ADLs:   Dressing- I    Eating- I   Maintaining continence- I   Transferring- I   Transportation- I, not currently since d/c.   Meal Prep- I   Managing Meds- D, husband takes care of meds.   Confirmed importance and Date/Time of follow-up visits scheduled: None- Husband states that he was told to scheduled a f/u apt after the cardiology visit. Pt has an apt set up with Dr Saralyn Pilar.   Confirmed with patient if condition worsens to call PCP or go to the Emergency Dept. Patient was given office number and encouraged to call back with questions or concerns: YES

## 2017-10-27 ENCOUNTER — Ambulatory Visit: Payer: Medicare Other | Attending: Orthopedic Surgery | Admitting: Physical Therapy

## 2017-10-27 ENCOUNTER — Encounter: Payer: Self-pay | Admitting: Physical Therapy

## 2017-10-27 DIAGNOSIS — M25551 Pain in right hip: Secondary | ICD-10-CM

## 2017-10-27 DIAGNOSIS — M6281 Muscle weakness (generalized): Secondary | ICD-10-CM

## 2017-10-27 DIAGNOSIS — R262 Difficulty in walking, not elsewhere classified: Secondary | ICD-10-CM

## 2017-10-27 DIAGNOSIS — M5440 Lumbago with sciatica, unspecified side: Secondary | ICD-10-CM

## 2017-10-28 ENCOUNTER — Telehealth: Payer: Self-pay | Admitting: Family Medicine

## 2017-10-28 NOTE — Telephone Encounter (Signed)
Stacey [name] PT at [facility] called saying patient has been seeing her for back pain and had a stroke on 5/31 and was discharged on 6/2.  Stacey [name] wants to know if it is ok to continue PT for her back and and also add left sided weakness (leg).  Or do you want to defer this to her cardiologist.  She sees him first of next week.  Stacey [name]'s call back is [phone]  Thanks Con Memos

## 2017-10-28 NOTE — Telephone Encounter (Signed)
Ok to order 

## 2017-10-28 NOTE — Telephone Encounter (Signed)
Please advise 

## 2017-10-28 NOTE — Therapy (Signed)
[facility] [address] [place], [address] Phone: [phone]   Fax:  [phone]  Patient Details  Name: Stacey [name] MRN: [id number] Date of Birth: Sep 15, 1940 Referring Provider:  [name], PA-C  Encounter Date: 10/27/2017   Patient arrived in clinic with recent history of CVA with admittance to hospital 10/22/2017 - 10/24/2017. She reported she is to continue with therapy for left sided weakness in LE and continued back pain and spasms however she does not have a new referral to continue therapy following this change in her medical status. Dr. Alben Spittle office was contacted 10/28/2017 as he is her PCP and message left to decide who will refer her back into therapy to continue with treatment. Plan: wait to hear back from Dr. [name] to determine further course of treatment.    Jomarie Longs PT 10/28/2017, 10:56 AM  [facility] [address] [place], [address] Phone: [phone]   Fax:  [phone]

## 2017-10-29 NOTE — Telephone Encounter (Signed)
Advised  ED 

## 2017-11-01 ENCOUNTER — Ambulatory Visit: Payer: Medicare Other | Admitting: Physical Therapy

## 2017-11-01 DIAGNOSIS — E782 Mixed hyperlipidemia: Secondary | ICD-10-CM | POA: Diagnosis not present

## 2017-11-01 DIAGNOSIS — I25810 Atherosclerosis of coronary artery bypass graft(s) without angina pectoris: Secondary | ICD-10-CM | POA: Diagnosis not present

## 2017-11-01 DIAGNOSIS — I4891 Unspecified atrial fibrillation: Secondary | ICD-10-CM | POA: Diagnosis not present

## 2017-11-01 DIAGNOSIS — I480 Paroxysmal atrial fibrillation: Secondary | ICD-10-CM | POA: Diagnosis not present

## 2017-11-01 DIAGNOSIS — I10 Essential (primary) hypertension: Secondary | ICD-10-CM | POA: Diagnosis not present

## 2017-11-01 DIAGNOSIS — I340 Nonrheumatic mitral (valve) insufficiency: Secondary | ICD-10-CM | POA: Diagnosis not present

## 2017-11-05 DIAGNOSIS — I480 Paroxysmal atrial fibrillation: Secondary | ICD-10-CM | POA: Diagnosis not present

## 2017-11-08 ENCOUNTER — Ambulatory Visit: Payer: Medicare Other | Admitting: Physical Therapy

## 2017-11-08 ENCOUNTER — Encounter: Payer: Self-pay | Admitting: Family Medicine

## 2017-11-08 ENCOUNTER — Encounter: Payer: Self-pay | Admitting: Physical Therapy

## 2017-11-08 ENCOUNTER — Ambulatory Visit (INDEPENDENT_AMBULATORY_CARE_PROVIDER_SITE_OTHER): Payer: Medicare Other | Admitting: Family Medicine

## 2017-11-08 ENCOUNTER — Other Ambulatory Visit: Payer: Self-pay

## 2017-11-08 VITALS — BP 138/72 | Temp 98.5°F | Wt 167.0 lb

## 2017-11-08 DIAGNOSIS — R531 Weakness: Secondary | ICD-10-CM | POA: Diagnosis not present

## 2017-11-08 DIAGNOSIS — M6281 Muscle weakness (generalized): Secondary | ICD-10-CM | POA: Diagnosis not present

## 2017-11-08 DIAGNOSIS — I4891 Unspecified atrial fibrillation: Secondary | ICD-10-CM

## 2017-11-08 DIAGNOSIS — M25551 Pain in right hip: Secondary | ICD-10-CM | POA: Diagnosis not present

## 2017-11-08 DIAGNOSIS — M5440 Lumbago with sciatica, unspecified side: Secondary | ICD-10-CM

## 2017-11-08 DIAGNOSIS — R262 Difficulty in walking, not elsewhere classified: Secondary | ICD-10-CM

## 2017-11-08 DIAGNOSIS — F410 Panic disorder [episodic paroxysmal anxiety] without agoraphobia: Secondary | ICD-10-CM

## 2017-11-08 DIAGNOSIS — I639 Cerebral infarction, unspecified: Secondary | ICD-10-CM

## 2017-11-08 MED ORDER — ALPRAZOLAM 0.25 MG PO TABS: ORAL_TABLET | ORAL | 3 refills | Status: DC

## 2017-11-08 NOTE — Progress Notes (Signed)
Patient: Stacey [name] Female    DOB: 1940/08/09   77 y.o.   MRN: [id number] Visit Date: 11/08/2017  Today's Provider: Wilhemena Durie, MD   Chief Complaint  Patient presents with  . Hospitalization Follow-up   Subjective:    HPI  Follow up Hospitalization  Patient was admitted to [facility] on 10/22/2017 and discharged on 10/24/2017. She was treated for Occipital Stroke. Treatment for this included starting on Eliquis. Patient reports that she has seen cardiology 1 week ago, and has a F/U appt with them next week.  Telephone follow up was done on 10/25/2017.  She reports good compliance with treatment. She reports this condition is stable.       Allergies  Allergen Reactions  . Cephalexin Anaphylaxis  . Atorvastatin   . Morphine And Related Hives  . Novocain [Procaine]     Chest pain  . Shellfish Allergy Swelling    angioedema  . Statins Other (See Comments)    Muscle pain  . Tomato     (by testing) - also beans, wheat, peas  . Penicillins Hives and Rash     Current Outpatient Medications:  .  acetaminophen (TYLENOL) 325 MG tablet, Take 2 tablets (650 mg total) by mouth every 6 (six) hours as needed for mild pain., Disp: , Rfl:  .  albuterol (VENTOLIN HFA) 108 (90 Base) MCG/ACT inhaler, INHALE 2 PUFFS BY MOUTH EVERY 4 TO 6 HOURS AS NEEDED FOR SHORTNESS OF BREATH, Disp: 18 g, Rfl: 5 .  amLODipine (NORVASC) 5 MG tablet, Take 1 tablet (5 mg total) daily by mouth., Disp: 90 tablet, Rfl: 3 .  apixaban (ELIQUIS) 5 MG TABS tablet, Take 1 tablet (5 mg total) by mouth 2 (two) times daily., Disp: 60 tablet, Rfl: 2 .  aspirin 81 MG tablet, Take 1 tablet (81 mg total) by mouth daily., Disp: 30 tablet, Rfl: 2 .  buPROPion (WELLBUTRIN XL) 300 MG 24 hr tablet, Take 1 tablet (300 mg total) by mouth daily., Disp: 90 tablet, Rfl: 3 .  capsaicin (ZOSTRIX) 0.025 % cream, Apply 1 application topically as needed (nerve pain in chest)., Disp: , Rfl:  .  Cholecalciferol (VITAMIN D)  2000 units tablet, Take 2,000 Units by mouth daily., Disp: , Rfl:  .  Cyanocobalamin (B-12) 500 MCG TABS, Take by mouth daily. , Disp: , Rfl:  .  diazepam (VALIUM) 5 MG tablet, Take 1 tablet (5 mg total) by mouth every 12 (twelve) hours as needed for anxiety. (Patient taking differently: Take 5 mg by mouth daily as needed for anxiety. ), Disp: 60 tablet, Rfl: 1 .  EPINEPHrine (EPIPEN 2-PAK) 0.3 mg/0.3 mL IJ SOAJ injection, INJECT AS DIRECTED FOR SEVERE ALLERGIC REACTION, Disp: 1 Device, Rfl: 1 .  levothyroxine (SYNTHROID, LEVOTHROID) 100 MCG tablet, Take 1 tablet (100 mcg total) by mouth daily., Disp: 90 tablet, Rfl: 3 .  lisinopril (PRINIVIL,ZESTRIL) 10 MG tablet, Take 1 tablet (10 mg total) by mouth daily., Disp: 30 tablet, Rfl: 2 .  metoprolol tartrate (LOPRESSOR) 25 MG tablet, Take 1 tablet (25 mg total) by mouth 2 (two) times daily., Disp: 60 tablet, Rfl: 11 .  Multiple Vitamin (MULTI-VITAMINS) TABS, Take 1 tablet by mouth daily., Disp: , Rfl:  .  oxymetazoline (AFRIN) 0.05 % nasal spray, Place 1 spray into both nostrils as needed for congestion., Disp: , Rfl:  .  rosuvastatin (CRESTOR) 40 MG tablet, Take 0.5 tablets (20 mg total) by mouth daily., Disp: 30 tablet, Rfl: 2 .  traMADol (ULTRAM) 50 MG tablet, Take 1 tablet (50 mg total) by mouth every 6 (six) hours as needed for moderate pain., Disp: 30 tablet, Rfl: 0 .  zolpidem (AMBIEN) 5 MG tablet, Take 1 tablet (5 mg total) by mouth at bedtime as needed. for sleep, Disp: 30 tablet, Rfl: 5  Review of Systems  Constitutional: Positive for fatigue. Negative for activity change, appetite change, chills, diaphoresis, fever and unexpected weight change.  HENT: Negative.   Eyes: Positive for visual disturbance.  Respiratory: Negative for cough and shortness of breath.   Cardiovascular: Negative for chest pain, palpitations and leg swelling.  Endocrine: Negative.   Musculoskeletal: Positive for arthralgias.  Allergic/Immunologic: Negative.     Neurological: Positive for weakness and light-headedness. Negative for dizziness and headaches.  Psychiatric/Behavioral: Negative for agitation, decreased concentration, dysphoric mood, self-injury, sleep disturbance and suicidal ideas. The patient is not nervous/anxious and is not hyperactive.     Social History   Tobacco Use  . Smoking status: Former Smoker    Last attempt to quit: 05/26/1975    Years since quitting: 42.4  . Smokeless tobacco: Never Used  . Tobacco comment: quit 45  years ago   Substance Use Topics  . Alcohol use: No   Objective:   BP 138/72 (BP Location: Right Arm, Patient Position: Sitting, Cuff Size: Normal)   Temp 98.5 F (36.9 C)   Wt 167 lb (75.8 kg)   BMI 30.54 kg/m  Vitals:   11/08/17 1048  BP: 138/72  Temp: 98.5 F (36.9 C)  Weight: 167 lb (75.8 kg)     Physical Exam  Constitutional: She is oriented to person, place, and time. She appears well-developed and well-nourished.  HENT:  Head: Normocephalic and atraumatic.  Right Ear: External ear normal.  Left Ear: External ear normal.  Nose: Nose normal.  Eyes: Conjunctivae are normal. No scleral icterus.  Neck: No thyromegaly present.  Cardiovascular: Normal rate, regular rhythm and normal heart sounds.  Pulmonary/Chest: Effort normal and breath sounds normal.  Abdominal: Soft.  Musculoskeletal: She exhibits no edema.  Neurological: She is alert and oriented to person, place, and time.  Skin: Skin is warm and dry.  Psychiatric: She has a normal mood and affect. Her behavior is normal. Judgment and thought content normal.        Assessment & Plan:     1. Atrial fibrillation, unspecified type ([place])   2. Panic attacks  - ALPRAZolam (XANAX) 0.25 MG tablet; Take 1/2-1 tablet every 8 hours as needed for anxiety.  Dispense: 60 tablet; Refill: 3  3. Left-sided weakness  4.Recent occipital stroke with visual disturbance       I have done the exam and reviewed the above chart and it is  accurate to the best of my knowledge. Development worker, community has been used in this note in any air is in the dictation or transcription are unintentional.  Wilhemena Durie, MD  [place]

## 2017-11-08 NOTE — Therapy (Signed)
[facility] [address], [place], [address] Phone: [phone]   Fax:  [phone]  Physical Therapy Evaluation  Patient Details  Name: Stacey [name] MRN: [id number] Date of Birth: 09-24-1940 Referring Provider: Miguel Aschoff   Encounter Date: 11/08/2017  PT End of Session - 11/08/17 1935    Visit Number  1    Number of Visits  12    Date for PT Re-Evaluation  12/20/17    Authorization Time Period  1/10 progress    PT Start Time  1825    PT Stop Time  1916    PT Time Calculation (min)  51 min    Activity Tolerance  Patient tolerated treatment well    Behavior During Therapy  [facility] for tasks assessed/performed       Past Medical History:  Diagnosis Date  . Abnormal nuclear stress test 05/24/2017  . Arthritis   . Asthma   . Atrial fibrillation, transient ([place])   . 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 Cancer ([place])   . Complication of anesthesia   . Depression   . Family history of adverse reaction to anesthesia    most of family - PONV  . Fibromyalgia   . Glaucoma   . Headache    migraines - 1-2x/mo  . HTN (hypertension) 03/09/2007  . Hyperlipidemia LDL goal <70 03/25/1993  . Hypertension   . Motion sickness    all moving vehicles  . PONV (postoperative nausea and vomiting)   . Thyroid disease     Past Surgical History:  Procedure Laterality Date  . ABDOMINAL HYSTERECTOMY    . APPENDECTOMY    . BLADDER SURGERY    . CATARACT EXTRACTION W/ INTRAOCULAR LENS  IMPLANT, BILATERAL    . COLONOSCOPY WITH PROPOFOL N/A 01/17/2016   Procedure: COLONOSCOPY WITH PROPOFOL;  Surgeon: [name], MD;  Location: [place];  Service: Endoscopy;  Laterality: N/A;  . CORONARY ARTERY BYPASS GRAFT N/A 02/20/2017   Procedure: CORONARY ARTERY BYPASS GRAFTING (CABG) x , three using left internal mammary artery to left anterior descending coronary artery  and right greater saphenous vein harvested endoscopically to distal right and diagonal coronary arteries.;  Surgeon: Grace Isaac, MD;  Location: [place];  Service: Open Heart Surgery;  Laterality: N/A;  . LEFT HEART CATH AND CORONARY ANGIOGRAPHY N/A 02/20/2017   Procedure: LEFT HEART CATH AND CORONARY ANGIOGRAPHY;  Surgeon: Isaias Cowman, MD;  Location: [place] [facility];  Service: Cardiovascular;  Laterality: N/A;  . LEFT HEART CATH AND CORS/GRAFTS ANGIOGRAPHY N/A 05/24/2017   Procedure: LEFT HEART CATH AND CORS/GRAFTS ANGIOGRAPHY;  Surgeon: Sherren Mocha, MD;  Location: [place] [facility];  Service: Cardiovascular;  Laterality: N/A;  . NASAL SINUS SURGERY    . POLYPECTOMY  01/17/2016   Procedure: POLYPECTOMY;  Surgeon: [name], MD;  Location: [facility];  Service: Endoscopy;;  . RIGHT OOPHORECTOMY    . TEE WITHOUT CARDIOVERSION N/A 02/20/2017   Procedure: TRANSESOPHAGEAL ECHOCARDIOGRAM (TEE);  Surgeon: Grace Isaac, MD;  Location: [place];  Service: Open Heart Surgery;  Laterality: N/A;    There were no vitals filed for this visit.   Subjective Assessment - 11/08/17 1854    Subjective  Patient reports she is improving since CVA 10/22/2017. she continues with weakness left LE and vision problems with left side     Pertinent History  Patient with recent  Occipital CVA 10/22/2017. Admitted to hospital for treatment 10/22/17 - 10/24/2017 and is imrpoving with symtpoms. She is now referred to out patient PT for treatment. long history of back pain with arthritis with episodes of exacerbation for at least the past 10 years. Previous therapy for back pain with good results for pain control. Recently had MI with CABG x 3 02/20/2017 and she is recuperating from this and has been more sedentary.     Limitations  Walking;Sitting;Standing;House hold activities transfer in/out of car; personal care    How long can you sit comfortably?  < 20 min.    How long can you stand  comfortably?  unable to tolerated standing at all in one position    How long can you walk comfortably?  walking is painful and she has to exercise due to recent MI and CVA    Currently in Pain?  Yes    Pain Score  5     Pain Location  Back    Pain Orientation  Lower;Upper    Pain Descriptors / Indicators  Aching;Sore    Pain Type  Chronic pain    Pain Onset  More than a month ago    Pain Frequency  Intermittent    Aggravating Factors   sitting, standing, walking    Pain Relieving Factors  rest    Effect of Pain on Daily Activities  limited with ADL's personal care, walking and standing and sitting activities         [facility] PT Assessment - 11/08/17 1857      Assessment   Medical Diagnosis  s/p CVA and back pain    Referring Provider  Miguel Aschoff    Onset Date/Surgical Date  10/22/17      Precautions   Precautions  None      Balance Screen   Has the patient fallen in the past 6 months  No      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    Leisure  family, cruises, outdoor activities, travel, puzzle books      Cognition   Overall Cognitive Status  Within Functional Limits for tasks assessed      Sensation   Light Touch  Appears Intact      ROM / Strength   AROM / PROM / Strength  AROM;Strength      AROM   Overall AROM Comments  limited left hip flexion in sitting to 50%, unable to raise left LE without assistance; lumbar spine limited moderately into flexion/extension with  increased back pain      Strength   Overall Strength Comments  Right LE: hip flexion 3+/5; knee extension 4/5, flexion 4/5; left LE: hip fleixon 3-/5, knee extension 4-/5, flexion 4-/5, ankle DF 3-/5, PF grossly 3-/5      Palpation   Palpation comment  point tender lower back bilateral  and right hip      Ambulation/Gait   Gait Comments  decreased step lenght left LE, decreased trunk rotation, decreased push off left LE         Objective measurements completed on examination: See above findings.      PT Education - 11/08/17 1910    Education provided  Yes    Education Details  POC, exercises for home core strengthening, left LE knee flexion with yellow resistive band    Person(s) Educated  Patient    Methods  Explanation;Demonstration;Verbal cues    Comprehension  Verbalized understanding;Returned demonstration;Verbal cues required          PT Long Term Goals - 11/08/17 1932      PT LONG TERM GOAL #1   Title  improve FOTO by 10 points demonstrating improved function with daily tasks    Baseline  FOTO    Status  New    Target Date  12/07/17      PT LONG TERM GOAL #2   Title  improve FOTO by 20 points demonstrating improved function with daily tasks    Baseline  --    Status  New    Target Date  01/04/18      PT LONG TERM GOAL #3   Title  patient will be independent with home program for pain control, exercises in order to transition to self management once discharged from physical therapy    Baseline  limited knowledge of appropriate pain control, exercise progression without guidance, cuing    Status  New    Target Date  01/04/18           Plan - 11/08/17 1936    Clinical Impression Statement  Patient is a 77 year old female who presents with chronic back pain and recent CVA with residual weakness left LE and decreased balance with walking. She has limitations of decreased strength left LE and back pain that limits function with personal care, daily activities  involving standing, sitting, walking activities. She will benefit from physical therapy intervention to improve strength, balance, pain control, improve function with ADLs.     History and Personal Factors relevant to plan of care:  Chronic back pain and arthritis. MI with CABG x 3 02/20/17 and recent Occipital CVA 10/22/17 with residual left LE weakness and decreased balance with ambulation. 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     Clinical Presentation  Evolving    Clinical Presentation due to:  back pain exacerbation, now weakness left LE    Clinical Decision Making  Moderate    Rehab Potential  Fair    Clinical Impairments Affecting Rehab Potential  (+)motivated, acute on chronic pain, active lifestyle prior to this episode(-)chronic condition, pain, recent CABG x 3 02/20/2017, arthritis, HTN, CVA     PT Frequency  1x / week    PT Duration  12 weeks    PT Treatment/Interventions  Cryotherapy;Electrical Stimulation;Moist Heat;Therapeutic exercise;Therapeutic activities;Neuromuscular re-education;Patient/family education;Manual techniques;Balance training    PT Next Visit Plan  pain control, exercise progression as needed    PT Home Exercise Plan  pain cotnrol, body mechanics, use of lumbar support for sitting, sleeping; basic core strengthening sitting, knee flexion     Consulted and Agree with Plan of Care  Patient       Patient will benefit from skilled therapeutic intervention in order to improve the following deficits and impairments:  Pain, Improper body mechanics, Increased muscle spasms, Decreased activity tolerance, Decreased endurance, Decreased range of motion, Decreased strength, Impaired perceived functional ability  Visit Diagnosis: Low back pain with sciatica, sciatica laterality unspecified, unspecified back pain laterality, unspecified chronicity - Plan: PT plan of care cert/re-cert  Muscle weakness  (generalized) - Plan: PT plan of care cert/re-cert  Difficulty in walking, not elsewhere classified - Plan: PT plan of care cert/re-cert  Pain in right hip - Plan: PT plan of care cert/re-cert     Problem List Patient Active Problem List   Diagnosis Date Noted  . CVA (cerebral vascular accident) ([place]) 10/22/2017  . Abnormal nuclear stress test 05/24/2017  . 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 PAF (paroxysmal atrial fibrillation) ([place])   . Non-STEMI (non-ST elevated myocardial infarction) ([place]) 02/20/2017  . 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 Moderate mitral insufficiency 01/11/2017  . Personal history of colonic polyps   . Benign neoplasm of ascending colon   . Benign neoplasm of descending colon   . Chest pain, moderate coronary artery risk 09/16/2015  . Disequilibrium 09/16/2015  . Panic attacks 09/11/2015  . Ganglion cyst 08/16/2015  . Hypernatremia 07/04/2015  . Upper back pain 07/04/2015  . Other osteoarthritis involving multiple joints 02/06/2015  . Clinical depression 09/27/2014  . Dry mouth 09/27/2014  . Fibrositis 09/27/2014  . LBP (low back pain) 09/27/2014  . Avitaminosis D 09/27/2014  . Decreased leukocytes 09/27/2014  . Abnormal blood sugar 05/03/2009  . Insomnia 09/29/2007  . HTN (hypertension) 03/09/2007  . Adaptation reaction 01/29/2007  . Acid reflux 10/07/2006  . Menopausal and postmenopausal disorder 10/05/2006  . Headache, migraine 10/05/2006  . Arthritis, degenerative 03/23/1994  . Adult hypothyroidism 04/15/1993  . Hyperlipidemia LDL goal <70 03/25/1993    Jomarie Longs PT 11/09/2017, 6:25 PM  [facility] [address] [place], [address] Phone: [phone]   Fax:  [phone]  Name: Stacey [name] MRN: [id number] Date of Birth: 28-Dec-1940

## 2017-11-15 ENCOUNTER — Ambulatory Visit: Payer: Medicare Other | Admitting: Physical Therapy

## 2017-11-15 ENCOUNTER — Encounter: Payer: Self-pay | Admitting: Physical Therapy

## 2017-11-15 DIAGNOSIS — I25810 Atherosclerosis of coronary artery bypass graft(s) without angina pectoris: Secondary | ICD-10-CM | POA: Diagnosis not present

## 2017-11-15 DIAGNOSIS — R262 Difficulty in walking, not elsewhere classified: Secondary | ICD-10-CM

## 2017-11-15 DIAGNOSIS — M5440 Lumbago with sciatica, unspecified side: Secondary | ICD-10-CM | POA: Diagnosis not present

## 2017-11-15 DIAGNOSIS — M25551 Pain in right hip: Secondary | ICD-10-CM | POA: Diagnosis not present

## 2017-11-15 DIAGNOSIS — E782 Mixed hyperlipidemia: Secondary | ICD-10-CM | POA: Diagnosis not present

## 2017-11-15 DIAGNOSIS — I481 Persistent atrial fibrillation: Secondary | ICD-10-CM | POA: Diagnosis not present

## 2017-11-15 DIAGNOSIS — M6281 Muscle weakness (generalized): Secondary | ICD-10-CM | POA: Diagnosis not present

## 2017-11-15 DIAGNOSIS — I10 Essential (primary) hypertension: Secondary | ICD-10-CM | POA: Diagnosis not present

## 2017-11-16 NOTE — Therapy (Signed)
[facility] [address] [place], [address] Phone: [phone]   Fax:  [phone]  Physical Therapy Treatment  Patient Details  Name: Stacey [name] MRN: [id number] Date of Birth: [birth year] Referring Provider: Miguel Aschoff   Encounter Date: 11/15/2017  PT End of Session - 11/15/17 1901    Visit Number  2    Number of Visits  12    Date for PT Re-Evaluation  12/20/17    Authorization Time Period  2/10 progress    PT Start Time  1825    PT Stop Time  1915    PT Time Calculation (min)  50 min    Activity Tolerance  Patient tolerated treatment well    Behavior During Therapy  [facility] for tasks assessed/performed       Past Medical History:  Diagnosis Date  . Abnormal nuclear stress test 05/24/2017  . Arthritis   . Asthma   . Atrial fibrillation, transient ([place])   . 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 Cancer ([place])   . Complication of anesthesia   . Depression   . Family history of adverse reaction to anesthesia    most of family - PONV  . Fibromyalgia   . Glaucoma   . Headache    migraines - 1-2x/mo  . HTN (hypertension) 03/09/2007  . Hyperlipidemia LDL goal <70 03/25/1993  . Hypertension   . Motion sickness    all moving vehicles  . PONV (postoperative nausea and vomiting)   . Thyroid disease     Past Surgical History:  Procedure Laterality Date  . ABDOMINAL HYSTERECTOMY    . APPENDECTOMY    . BLADDER SURGERY    . CATARACT EXTRACTION W/ INTRAOCULAR LENS  IMPLANT, BILATERAL    . COLONOSCOPY WITH PROPOFOL N/A 01/17/2016   Procedure: COLONOSCOPY WITH PROPOFOL;  Surgeon: [name], MD;  Location: [place];  Service: Endoscopy;  Laterality: N/A;  . CORONARY ARTERY BYPASS GRAFT N/A 02/20/2017   Procedure: CORONARY ARTERY BYPASS GRAFTING (CABG) x , three using left internal mammary artery to left anterior descending coronary artery  and right greater saphenous vein harvested endoscopically to distal right and diagonal coronary arteries.;  Surgeon: Grace Isaac, MD;  Location: [place];  Service: Open Heart Surgery;  Laterality: N/A;  . LEFT HEART CATH AND CORONARY ANGIOGRAPHY N/A 02/20/2017   Procedure: LEFT HEART CATH AND CORONARY ANGIOGRAPHY;  Surgeon: Isaias Cowman, MD;  Location: [place] [facility];  Service: Cardiovascular;  Laterality: N/A;  . LEFT HEART CATH AND CORS/GRAFTS ANGIOGRAPHY N/A 05/24/2017   Procedure: LEFT HEART CATH AND CORS/GRAFTS ANGIOGRAPHY;  Surgeon: Sherren Mocha, MD;  Location: [place] [facility];  Service: Cardiovascular;  Laterality: N/A;  . NASAL SINUS SURGERY    . POLYPECTOMY  01/17/2016   Procedure: POLYPECTOMY;  Surgeon: [name], MD;  Location: [place];  Service: Endoscopy;;  . RIGHT OOPHORECTOMY    . TEE WITHOUT CARDIOVERSION N/A 02/20/2017   Procedure: TRANSESOPHAGEAL ECHOCARDIOGRAM (TEE);  Surgeon: Grace Isaac, MD;  Location: [place];  Service: Open Heart Surgery;  Laterality: N/A;    There were no vitals filed for this visit.  Subjective Assessment - 11/15/17 1828    Subjective  Patient reports she is improving with left LE weakness and continues with visual disturbance left eye.     Pertinent History  Patient with recent Occipital CVA 10/22/2017. Admitted to  hospital for treatment 10/22/17 - 10/24/2017 and is imrpoving with symtpoms. She is now referred to out patient PT for treatment. long history of back pain with arthritis with episodes of exacerbation for at least the past 10 years. Previous therapy for back pain with good results for pain control. Recently had MI with CABG x 3 02/20/2017 and she is recuperating from this and has been more sedentary.     Limitations  Walking;Sitting;Standing;House hold activities transfer in/out of car; personal care    How long can you sit comfortably?  < 20 min.    How long can you stand comfortably?  unable to tolerated  standing at all in one position    How long can you walk comfortably?  walking is painful and she has to exercise due to recent MI and CVA    Currently in Pain?  Yes    Pain Score  6     Pain Location  Back    Pain Orientation  Lower;Mid;Upper    Pain Descriptors / Indicators  Aching;Sore;Spasm    Pain Type  Chronic pain    Pain Onset  More than a month ago    Pain Frequency  Intermittent         Objective: Palpation: spasms with increased tenderness along bilateral thoracic spine and lumbar paraspinal muscles. Strength: left LE: hip flexion 3/5; knee extension 4-/5; knee flexion 4-/5 all with decreased control and decreased endurance  Treatment: Therapeutic exercise: standing side stepping x 1 min with fatigue, running man x 10 reps each LE with demonstration and verbal cuing on balance beam   Manual therapy:23 min.goal: pain, spasm  patient prone with pillow under lower legs and head supported in cradle: STM performed to upper and lower spine paraspinal muscles, superficial techniques  Modalities: Electrical stimulation: 15 min High volt estim.clincial program for muscle spasms (4) electrodes applied to lower lumbar spine, intensity to tolerance with patient in prone position with LEs supported on pillow with moist heat applied to same;  goal: pain, spasms  Patient response to treatment: patient demonstrated improved technique with exercises with moderate VC and repeated demonstration for correct alignment. Patient with decreased pain from 6 /10 to 4 /10. Patient with decreased spasms by 30% following STM. Improved transfer off treatment table following session.          PT Education - 11/15/17 1906    Education provided  Yes    Education Details  progress, exercise instruction    Person(s) Educated  Patient    Methods  Explanation;Demonstration;Verbal cues    Comprehension  Verbal cues required;Returned demonstration;Verbalized understanding          PT Long Term  Goals - 11/08/17 1932      PT LONG TERM GOAL #1   Title  improve FOTO by 10 points demonstrating improved function with daily tasks    Baseline  FOTO    Status  New    Target Date  12/07/17      PT LONG TERM GOAL #2   Title  improve FOTO by 20 points demonstrating improved function with daily tasks    Baseline  --    Status  New    Target Date  01/04/18      PT LONG TERM GOAL #3   Title  patient will be independent with home program for pain control, exercises in order to transition to self management once discharged from physical therapy    Baseline  limited knowledge of appropriate pain control,  exercise progression without guidance, cuing    Status  New    Target Date  01/04/18            Plan - 11/15/17 1942    Clinical Impression Statement  Patient is progressing with improvement noted in strength left LE and decreasing spasms in back.  She continues with pain that limits function with daily tasks and she will require additional physical therapy intervention to achieve maximal function.    Rehab Potential  Fair    Clinical Impairments Affecting Rehab Potential  (+)motivated, acute on chronic pain, active lifestyle prior to this episode(-)chronic condition, pain, recent CABG x 3 02/20/2017, arthritis, HTN, CVA     PT Frequency  1x / week    PT Duration  12 weeks    PT Treatment/Interventions  Cryotherapy;Electrical Stimulation;Moist Heat;Therapeutic exercise;Therapeutic activities;Neuromuscular re-education;Patient/family education;Manual techniques;Balance training    PT Next Visit Plan  pain control, exercise progression as needed    PT Home Exercise Plan  pain cotnrol, body mechanics, use of lumbar support for sitting, sleeping; basic core strengthening sitting, knee flexion        Patient will benefit from skilled therapeutic intervention in order to improve the following deficits and impairments:  Pain, Improper body mechanics, Increased muscle spasms, Decreased  activity tolerance, Decreased endurance, Decreased range of motion, Decreased strength, Impaired perceived functional ability  Visit Diagnosis: Muscle weakness (generalized)  Low back pain with sciatica, sciatica laterality unspecified, unspecified back pain laterality, unspecified chronicity  Difficulty in walking, not elsewhere classified  Pain in right hip     Problem List Patient Active Problem List   Diagnosis Date Noted  . CVA (cerebral vascular accident) ([place]) 10/22/2017  . Abnormal nuclear stress test 05/24/2017  . 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 PAF (paroxysmal atrial fibrillation) ([place])   . Non-STEMI (non-ST elevated myocardial infarction) ([place]) 02/20/2017  . 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 Moderate mitral insufficiency 01/11/2017  . Personal history of colonic polyps   . Benign neoplasm of ascending colon   . Benign neoplasm of descending colon   . Chest pain, moderate coronary artery risk 09/16/2015  . Disequilibrium 09/16/2015  . Panic attacks 09/11/2015  . Ganglion cyst 08/16/2015  . Hypernatremia 07/04/2015  . Upper back pain 07/04/2015  . Other osteoarthritis involving multiple joints 02/06/2015  . Clinical depression 09/27/2014  . Dry mouth 09/27/2014  . Fibrositis 09/27/2014  . LBP (low back pain) 09/27/2014  . Avitaminosis D 09/27/2014  . Decreased leukocytes 09/27/2014  . Abnormal blood sugar 05/03/2009  . Insomnia 09/29/2007  . HTN (hypertension) 03/09/2007  . Adaptation reaction 01/29/2007  . Acid reflux 10/07/2006  . Menopausal and postmenopausal disorder 10/05/2006  . Headache, migraine 10/05/2006  . Arthritis, degenerative 03/23/1994  . Adult hypothyroidism 04/15/1993  . Hyperlipidemia LDL goal <70 03/25/1993    Jomarie Longs PT 11/16/2017, 7:07 PM  [place] [facility] [address], [place], [address] Phone: [phone]    Fax:  [phone]  Name: Stacey [name] MRN: [id number] Date of Birth: August 18, 1940

## 2017-11-17 ENCOUNTER — Encounter: Payer: Medicare Other | Admitting: Physical Therapy

## 2017-11-23 ENCOUNTER — Encounter: Payer: Self-pay | Admitting: Physical Therapy

## 2017-11-23 ENCOUNTER — Ambulatory Visit: Payer: Medicare Other | Attending: Family Medicine | Admitting: Physical Therapy

## 2017-11-23 DIAGNOSIS — M6281 Muscle weakness (generalized): Secondary | ICD-10-CM | POA: Diagnosis not present

## 2017-11-23 DIAGNOSIS — M5440 Lumbago with sciatica, unspecified side: Secondary | ICD-10-CM | POA: Diagnosis not present

## 2017-11-23 DIAGNOSIS — M62830 Muscle spasm of back: Secondary | ICD-10-CM | POA: Diagnosis not present

## 2017-11-23 DIAGNOSIS — R262 Difficulty in walking, not elsewhere classified: Secondary | ICD-10-CM | POA: Diagnosis not present

## 2017-11-23 DIAGNOSIS — M25551 Pain in right hip: Secondary | ICD-10-CM | POA: Diagnosis not present

## 2017-11-24 NOTE — Therapy (Signed)
[place] [facility] [address] [place], [address] Phone: [phone]   Fax:  [phone]  Physical Therapy Treatment  Patient Details  Name: Stacey [name] MRN: [id number] Date of Birth: 1940/06/17 Referring Provider: Miguel Aschoff   Encounter Date: 11/23/2017  PT End of Session - 11/24/17 0954    Visit Number  3    Number of Visits  12    Date for PT Re-Evaluation  12/20/17    Authorization Time Period  3/10 progress    PT Start Time  1820    PT Stop Time  1905    PT Time Calculation (min)  45 min    Activity Tolerance  Patient tolerated treatment well    Behavior During Therapy  [facility] for tasks assessed/performed       Past Medical History:  Diagnosis Date  . Abnormal nuclear stress test 05/24/2017  . Arthritis   . Asthma   . Atrial fibrillation, transient ([place])   . 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 Cancer ([place])   . Complication of anesthesia   . Depression   . Family history of adverse reaction to anesthesia    most of family - PONV  . Fibromyalgia   . Glaucoma   . Headache    migraines - 1-2x/mo  . HTN (hypertension) 03/09/2007  . Hyperlipidemia LDL goal <70 03/25/1993  . Hypertension   . Motion sickness    all moving vehicles  . PONV (postoperative nausea and vomiting)   . Thyroid disease     Past Surgical History:  Procedure Laterality Date  . ABDOMINAL HYSTERECTOMY    . APPENDECTOMY    . BLADDER SURGERY    . CATARACT EXTRACTION W/ INTRAOCULAR LENS  IMPLANT, BILATERAL    . COLONOSCOPY WITH PROPOFOL N/A 01/17/2016   Procedure: COLONOSCOPY WITH PROPOFOL;  Surgeon: [name], MD;  Location: [place];  Service: Endoscopy;  Laterality: N/A;  . CORONARY ARTERY BYPASS GRAFT N/A 02/20/2017   Procedure: CORONARY ARTERY BYPASS GRAFTING (CABG) x , three using left internal mammary artery to left anterior descending coronary artery and  right greater saphenous vein harvested endoscopically to distal right and diagonal coronary arteries.;  Surgeon: Grace Isaac, MD;  Location: [place];  Service: Open Heart Surgery;  Laterality: N/A;  . LEFT HEART CATH AND CORONARY ANGIOGRAPHY N/A 02/20/2017   Procedure: LEFT HEART CATH AND CORONARY ANGIOGRAPHY;  Surgeon: Isaias Cowman, MD;  Location: [place] [facility];  Service: Cardiovascular;  Laterality: N/A;  . LEFT HEART CATH AND CORS/GRAFTS ANGIOGRAPHY N/A 05/24/2017   Procedure: LEFT HEART CATH AND CORS/GRAFTS ANGIOGRAPHY;  Surgeon: Sherren Mocha, MD;  Location: [place] [facility];  Service: Cardiovascular;  Laterality: N/A;  . NASAL SINUS SURGERY    . POLYPECTOMY  01/17/2016   Procedure: POLYPECTOMY;  Surgeon: [name], MD;  Location: [place];  Service: Endoscopy;;  . RIGHT OOPHORECTOMY    . TEE WITHOUT CARDIOVERSION N/A 02/20/2017   Procedure: TRANSESOPHAGEAL ECHOCARDIOGRAM (TEE);  Surgeon: Grace Isaac, MD;  Location: [place];  Service: Open Heart Surgery;  Laterality: N/A;    There were no vitals filed for this visit.  Subjective Assessment - 11/23/17 1824    Subjective  Patient reports slow, steady improvement with left LE strength. She continues with back pain and spasms that limits functional activities. She reports the electrical stimulation is helping with lower back pain.  She is compliant with home program and is working with this at home.     Pertinent History  Patient with recent Occipital CVA 10/22/2017. Admitted to hospital for treatment 10/22/17 - 10/24/2017 and is imrpoving with symtpoms. She is now referred to out patient PT for treatment. long history of back pain with arthritis with episodes of exacerbation for at least the past 10 years. Previous therapy for back pain with good results for pain control. Recently had MI with CABG x 3 02/20/2017 and she is recuperating from this and has been more sedentary.     Limitations   Walking;Sitting;Standing;House hold activities transfer in/out of car; personal care    How long can you sit comfortably?  < 20 min.    How long can you stand comfortably?  unable to tolerated standing at all in one position    How long can you walk comfortably?  walking is painful and she has to exercise due to recent MI and CVA    Currently in Pain?  Yes    Pain Score  5     Pain Location  Back    Pain Orientation  Lower;Mid    Pain Descriptors / Indicators  Aching;Spasm;Sore    Pain Type  Chronic pain    Pain Onset  More than a month ago    Pain Frequency  Intermittent         Objective: Palpation: spasms with tenderness along bilateral thoracic spine and lumbar paraspinal muscles improved from previous session   Treatment:  Manual therapy:25 min.goal: pain, spasm  patient prone with pillow under lower legs and head supported in cradle: STM performed to upper and lower spine paraspinal muscles, superficial techniques   Modalities: Electrical stimulation: 20 min High volt estim.clincial program for muscle spasms (4) electrodes applied to lower lumbar spine, intensity to tolerance with patient in prone position with LEs supported on pillow with moist heat applied to same;  goal: pain, spasms   Patient response to treatment:  Patient reported decreased pain from 5/10 to 4/10. Patient with decreased spasms by 50% following STM.        PT Education - 11/23/17 1843    Education provided  Yes    Education Details  re assessed HEP to continue at home    Person(s) Educated  Patient    Methods  Explanation    Comprehension  Verbalized understanding          PT Long Term Goals - 11/08/17 1932      PT LONG TERM GOAL #1   Title  improve FOTO by 10 points demonstrating improved function with daily tasks    Baseline  FOTO    Status  New    Target Date  12/07/17      PT LONG TERM GOAL #2   Title  improve FOTO by 20 points demonstrating improved function with daily tasks     Baseline  --    Status  New    Target Date  01/04/18      PT LONG TERM GOAL #3   Title  patient will be independent with home program for pain control, exercises in order to transition to self management once discharged from physical therapy    Baseline  limited knowledge of appropriate pain control, exercise progression without guidance, cuing    Status  New    Target Date  01/04/18          Plan - 11/23/17 1915    Clinical Impression  Statement  Patient continues with back symptoms and Left LE is improving strength and control. She continues with left sided decreased vision and is learning to compensate for this. She is responding well to manual therapy STM and modalities with decreasing spasms and improved mobility following treatment. She will continue to benefit from physical therapy intervention to address limitaitons in order to improve function.     Rehab Potential  Fair    Clinical Impairments Affecting Rehab Potential  (+)motivated, acute on chronic pain, active lifestyle prior to this episode(-)chronic condition, pain, recent CABG x 3 02/20/2017, arthritis, HTN, CVA     PT Frequency  1x / week    PT Duration  12 weeks    PT Treatment/Interventions  Cryotherapy;Electrical Stimulation;Moist Heat;Therapeutic exercise;Therapeutic activities;Neuromuscular re-education;Patient/family education;Manual techniques;Balance training    PT Next Visit Plan  pain control, exercise progression as needed    PT Home Exercise Plan  pain cotnrol, body mechanics, use of lumbar support for sitting, sleeping; basic core strengthening sitting, knee flexion        Patient will benefit from skilled therapeutic intervention in order to improve the following deficits and impairments:  Pain, Improper body mechanics, Increased muscle spasms, Decreased activity tolerance, Decreased endurance, Decreased range of motion, Decreased strength, Impaired perceived functional ability  Visit Diagnosis: Muscle  weakness (generalized)  Low back pain with sciatica, sciatica laterality unspecified, unspecified back pain laterality, unspecified chronicity  Muscle spasm of back     Problem List Patient Active Problem List   Diagnosis Date Noted  . CVA (cerebral vascular accident) ([place]) 10/22/2017  . Abnormal nuclear stress test 05/24/2017  . 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 PAF (paroxysmal atrial fibrillation) ([place])   . Non-STEMI (non-ST elevated myocardial infarction) ([place]) 02/20/2017  . 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 Moderate mitral insufficiency 01/11/2017  . Personal history of colonic polyps   . Benign neoplasm of ascending colon   . Benign neoplasm of descending colon   . Chest pain, moderate coronary artery risk 09/16/2015  . Disequilibrium 09/16/2015  . Panic attacks 09/11/2015  . Ganglion cyst 08/16/2015  . Hypernatremia 07/04/2015  . Upper back pain 07/04/2015  . Other osteoarthritis involving multiple joints 02/06/2015  . Clinical depression 09/27/2014  . Dry mouth 09/27/2014  . Fibrositis 09/27/2014  . LBP (low back pain) 09/27/2014  . Avitaminosis D 09/27/2014  . Decreased leukocytes 09/27/2014  . Abnormal blood sugar 05/03/2009  . Insomnia 09/29/2007  . HTN (hypertension) 03/09/2007  . Adaptation reaction 01/29/2007  . Acid reflux 10/07/2006  . Menopausal and postmenopausal disorder 10/05/2006  . Headache, migraine 10/05/2006  . Arthritis, degenerative 03/23/1994  . Adult hypothyroidism 04/15/1993  . Hyperlipidemia LDL goal <70 03/25/1993    Jomarie Longs PT 11/24/2017, 9:59 AM  [facility] [address] [place], [address] Phone: [phone]   Fax:  [phone]  Name: Stacey [name] MRN: [id number] Date of Birth: Oct 10, 1940

## 2017-11-29 ENCOUNTER — Encounter: Payer: Self-pay | Admitting: Physical Therapy

## 2017-11-29 ENCOUNTER — Ambulatory Visit: Payer: Medicare Other | Admitting: Physical Therapy

## 2017-11-29 DIAGNOSIS — M5440 Lumbago with sciatica, unspecified side: Secondary | ICD-10-CM | POA: Diagnosis not present

## 2017-11-29 DIAGNOSIS — M25551 Pain in right hip: Secondary | ICD-10-CM | POA: Diagnosis not present

## 2017-11-29 DIAGNOSIS — M62830 Muscle spasm of back: Secondary | ICD-10-CM

## 2017-11-29 DIAGNOSIS — M6281 Muscle weakness (generalized): Secondary | ICD-10-CM

## 2017-11-29 DIAGNOSIS — R262 Difficulty in walking, not elsewhere classified: Secondary | ICD-10-CM | POA: Diagnosis not present

## 2017-11-30 NOTE — Therapy (Signed)
[facility] [address] [place], [address] Phone: [phone]   Fax:  [phone]  Physical Therapy Treatment  Patient Details  Name: Stacey [name] MRN: [id number] Date of Birth: 1941/01/28 Referring Provider: Miguel Aschoff   Encounter Date: 11/29/2017  PT End of Session - 11/29/17 1920    Visit Number  4    Number of Visits  12    Date for PT Re-Evaluation  12/20/17    Authorization Time Period  4/10 progress    PT Start Time  1825    PT Stop Time  1915    PT Time Calculation (min)  50 min    Activity Tolerance  Patient tolerated treatment well    Behavior During Therapy  [facility] for tasks assessed/performed       Past Medical History:  Diagnosis Date  . Abnormal nuclear stress test 05/24/2017  . Arthritis   . Asthma   . Atrial fibrillation, transient ([place])   . 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 Cancer ([place])   . Complication of anesthesia   . Depression   . Family history of adverse reaction to anesthesia    most of family - PONV  . Fibromyalgia   . Glaucoma   . Headache    migraines - 1-2x/mo  . HTN (hypertension) 03/09/2007  . Hyperlipidemia LDL goal <70 03/25/1993  . Hypertension   . Motion sickness    all moving vehicles  . PONV (postoperative nausea and vomiting)   . Thyroid disease     Past Surgical History:  Procedure Laterality Date  . ABDOMINAL HYSTERECTOMY    . APPENDECTOMY    . BLADDER SURGERY    . CATARACT EXTRACTION W/ INTRAOCULAR LENS  IMPLANT, BILATERAL    . COLONOSCOPY WITH PROPOFOL N/A 01/17/2016   Procedure: COLONOSCOPY WITH PROPOFOL;  Surgeon: [name], MD;  Location: [facility];  Service: Endoscopy;  Laterality: N/A;  . CORONARY ARTERY BYPASS GRAFT N/A 02/20/2017   Procedure: CORONARY ARTERY BYPASS GRAFTING (CABG) x , three using left internal mammary artery to left anterior descending coronary artery and  right greater saphenous vein harvested endoscopically to distal right and diagonal coronary arteries.;  Surgeon: Grace Isaac, MD;  Location: [facility];  Service: Open Heart Surgery;  Laterality: N/A;  . LEFT HEART CATH AND CORONARY ANGIOGRAPHY N/A 02/20/2017   Procedure: LEFT HEART CATH AND CORONARY ANGIOGRAPHY;  Surgeon: Isaias Cowman, MD;  Location: [facility];  Service: Cardiovascular;  Laterality: N/A;  . LEFT HEART CATH AND CORS/GRAFTS ANGIOGRAPHY N/A 05/24/2017   Procedure: LEFT HEART CATH AND CORS/GRAFTS ANGIOGRAPHY;  Surgeon: Sherren Mocha, MD;  Location: [facility];  Service: Cardiovascular;  Laterality: N/A;  . NASAL SINUS SURGERY    . POLYPECTOMY  01/17/2016   Procedure: POLYPECTOMY;  Surgeon: [name], MD;  Location: [facility];  Service: Endoscopy;;  . RIGHT OOPHORECTOMY    . TEE WITHOUT CARDIOVERSION N/A 02/20/2017   Procedure: TRANSESOPHAGEAL ECHOCARDIOGRAM (TEE);  Surgeon: Grace Isaac, MD;  Location: [facility];  Service: Open Heart Surgery;  Laterality: N/A;    There were no vitals filed for this visit.  Subjective Assessment - 11/29/17 1825    Subjective  Patient reports she is exercising consistently at home left LE all joints and planes. She conintues with lower back pain that is constant and worse with activity. She reports she has  temporary relieft with treatment with treatment.     Pertinent History  Patient with recent Occipital CVA 10/22/2017. Admitted to hospital for treatment 10/22/17 - 10/24/2017 and is imrpoving with symtpoms. She is now referred to out patient PT for treatment. long history of back pain with arthritis with episodes of exacerbation for at least the past 10 years. Previous therapy for back pain with good results for pain control. Recently had MI with CABG x 3 02/20/2017 and she is recuperating from this and has been more sedentary.     Limitations  Walking;Sitting;Standing;House hold activities transfer in/out of car;  personal care    How long can you sit comfortably?  < 20 min.    How long can you stand comfortably?  unable to tolerated standing at all in one position    How long can you walk comfortably?  walking is painful and she has to exercise due to recent MI and CVA    Currently in Pain?  Yes    Pain Score  5     Pain Location  Back    Pain Orientation  Lower    Pain Type  Chronic pain    Pain Onset  More than a month ago    Pain Frequency  Intermittent         Objective: AROM: left LE: full isolated hip flexion, knee extension, flexion, ankle DF/PF; all assessed in sitting position Palpation: spasms with tenderness along bilateral thoracic spine and lumbar paraspinal muscles improved from previous session   Treatment:  Manual therapy: 25 min.goal: pain, spasm  patient prone with pillow under lower legs and head supported in cradle: STM performed to upper and lower spine paraspinal muscles, superficial techniques   Modalities: Electrical stimulation: 20 min High volt estim.clincial program for muscle spasms (4) electrodes applied to lower lumbar spine, intensity to tolerance with patient in prone position with LEs supported on pillow with moist heat applied to same;  goal: pain, spasms   Patient response to treatment:  Patient demonstrated improved soft tissue elasticity with decreased spasms to mild following STM. decreased pain by 25% reported at end of session. Patient continued with stiffness and moderate difficutly getting off of treatment table at end of session.           PT Education - 11/29/17 1900    Education provided  Yes    Education Details  re assessed HEP    Person(s) Educated  Patient    Methods  Explanation    Comprehension  Verbalized understanding          PT Long Term Goals - 11/08/17 1932      PT LONG TERM GOAL #1   Title  improve FOTO by 10 points demonstrating improved function with daily tasks    Baseline  FOTO    Status  New    Target Date   12/07/17      PT LONG TERM GOAL #2   Title  improve FOTO by 20 points demonstrating improved function with daily tasks    Baseline  --    Status  New    Target Date  01/04/18      PT LONG TERM GOAL #3   Title  patient will be independent with home program for pain control, exercises in order to transition to self management once discharged from physical therapy    Baseline  limited knowledge of appropriate pain control, exercise progression without guidance, cuing    Status  New  Target Date  01/04/18          Plan - 11/29/17 1924    Clinical Impression Statement  Patient demonstrates improving isolated movement and improving strenght left LE. She continues with chronic lower back pain and symptoms that is responding with temporary relief with physical therapy intervention. She should continue to progress with additional physical therapy intervention.    Rehab Potential  Fair    Clinical Impairments Affecting Rehab Potential  (+)motivated, acute on chronic pain, active lifestyle prior to this episode(-)chronic condition, pain, recent CABG x 3 02/20/2017, arthritis, HTN, CVA     PT Frequency  1x / week    PT Duration  12 weeks    PT Treatment/Interventions  Cryotherapy;Electrical Stimulation;Moist Heat;Therapeutic exercise;Therapeutic activities;Neuromuscular re-education;Patient/family education;Manual techniques;Balance training    PT Next Visit Plan  pain control, exercise progression as needed    PT Home Exercise Plan  pain cotnrol, body mechanics, use of lumbar support for sitting, sleeping; basic core strengthening sitting, knee flexion        Patient will benefit from skilled therapeutic intervention in order to improve the following deficits and impairments:  Pain, Improper body mechanics, Increased muscle spasms, Decreased activity tolerance, Decreased endurance, Decreased range of motion, Decreased strength, Impaired perceived functional ability  Visit Diagnosis: Muscle  weakness (generalized)  Low back pain with sciatica, sciatica laterality unspecified, unspecified back pain laterality, unspecified chronicity  Muscle spasm of back     Problem List Patient Active Problem List   Diagnosis Date Noted  . CVA (cerebral vascular accident) ([place]) 10/22/2017  . Abnormal nuclear stress test 05/24/2017  . 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 PAF (paroxysmal atrial fibrillation) ([place])   . Non-STEMI (non-ST elevated myocardial infarction) ([place]) 02/20/2017  . 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 Moderate mitral insufficiency 01/11/2017  . Personal history of colonic polyps   . Benign neoplasm of ascending colon   . Benign neoplasm of descending colon   . Chest pain, moderate coronary artery risk 09/16/2015  . Disequilibrium 09/16/2015  . Panic attacks 09/11/2015  . Ganglion cyst 08/16/2015  . Hypernatremia 07/04/2015  . Upper back pain 07/04/2015  . Other osteoarthritis involving multiple joints 02/06/2015  . Clinical depression 09/27/2014  . Dry mouth 09/27/2014  . Fibrositis 09/27/2014  . LBP (low back pain) 09/27/2014  . Avitaminosis D 09/27/2014  . Decreased leukocytes 09/27/2014  . Abnormal blood sugar 05/03/2009  . Insomnia 09/29/2007  . HTN (hypertension) 03/09/2007  . Adaptation reaction 01/29/2007  . Acid reflux 10/07/2006  . Menopausal and postmenopausal disorder 10/05/2006  . Headache, migraine 10/05/2006  . Arthritis, degenerative 03/23/1994  . Adult hypothyroidism 04/15/1993  . Hyperlipidemia LDL goal <70 03/25/1993    Jomarie Longs PT 11/30/2017, 11:28 PM  [facility] [address] [place], [address] Phone: [phone]   Fax:  [phone]  Name: AUTUMNE KALLIO MRN: [id number] Date of Birth: 1940-06-07

## 2017-12-06 ENCOUNTER — Encounter: Payer: Self-pay | Admitting: Physical Therapy

## 2017-12-06 ENCOUNTER — Ambulatory Visit: Payer: Medicare Other | Admitting: Physical Therapy

## 2017-12-06 DIAGNOSIS — M62830 Muscle spasm of back: Secondary | ICD-10-CM

## 2017-12-06 DIAGNOSIS — M6281 Muscle weakness (generalized): Secondary | ICD-10-CM | POA: Diagnosis not present

## 2017-12-06 DIAGNOSIS — R262 Difficulty in walking, not elsewhere classified: Secondary | ICD-10-CM

## 2017-12-06 DIAGNOSIS — M25551 Pain in right hip: Secondary | ICD-10-CM | POA: Diagnosis not present

## 2017-12-06 DIAGNOSIS — M5440 Lumbago with sciatica, unspecified side: Secondary | ICD-10-CM

## 2017-12-06 NOTE — Therapy (Signed)
[facility] [address] [place], [address] Phone: [phone]   Fax:  [phone]  Physical Therapy Treatment  Patient Details  Name: Stacey [name] MRN: [id number] Date of Birth: [birth year] Referring Provider: Miguel Aschoff   Encounter Date: 12/06/2017  PT End of Session - 12/06/17 1909    Visit Number  5    Number of Visits  12    Date for PT Re-Evaluation  12/20/17    Authorization Time Period  5/10 progress    PT Start Time  1827    PT Stop Time  1905    PT Time Calculation (min)  38 min    Activity Tolerance  Patient tolerated treatment well    Behavior During Therapy  [facility] for tasks assessed/performed       Past Medical History:  Diagnosis Date  . Abnormal nuclear stress test 05/24/2017  . Arthritis   . Asthma   . Atrial fibrillation, transient ([place])   . 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 Cancer ([place])   . Complication of anesthesia   . Depression   . Family history of adverse reaction to anesthesia    most of family - PONV  . Fibromyalgia   . Glaucoma   . Headache    migraines - 1-2x/mo  . HTN (hypertension) 03/09/2007  . Hyperlipidemia LDL goal <70 03/25/1993  . Hypertension   . Motion sickness    all moving vehicles  . PONV (postoperative nausea and vomiting)   . Thyroid disease     Past Surgical History:  Procedure Laterality Date  . ABDOMINAL HYSTERECTOMY    . APPENDECTOMY    . BLADDER SURGERY    . CATARACT EXTRACTION W/ INTRAOCULAR LENS  IMPLANT, BILATERAL    . COLONOSCOPY WITH PROPOFOL N/A 01/17/2016   Procedure: COLONOSCOPY WITH PROPOFOL;  Surgeon: [name], MD;  Location: [facility];  Service: Endoscopy;  Laterality: N/A;  . CORONARY ARTERY BYPASS GRAFT N/A 02/20/2017   Procedure: CORONARY ARTERY BYPASS GRAFTING (CABG) x , three using left internal mammary artery to left anterior descending coronary artery  and right greater saphenous vein harvested endoscopically to distal right and diagonal coronary arteries.;  Surgeon: Grace Isaac, MD;  Location: [facility];  Service: Open Heart Surgery;  Laterality: N/A;  . LEFT HEART CATH AND CORONARY ANGIOGRAPHY N/A 02/20/2017   Procedure: LEFT HEART CATH AND CORONARY ANGIOGRAPHY;  Surgeon: Isaias Cowman, MD;  Location: [facility];  Service: Cardiovascular;  Laterality: N/A;  . LEFT HEART CATH AND CORS/GRAFTS ANGIOGRAPHY N/A 05/24/2017   Procedure: LEFT HEART CATH AND CORS/GRAFTS ANGIOGRAPHY;  Surgeon: Sherren Mocha, MD;  Location: [facility];  Service: Cardiovascular;  Laterality: N/A;  . NASAL SINUS SURGERY    . POLYPECTOMY  01/17/2016   Procedure: POLYPECTOMY;  Surgeon: [name], MD;  Location: [facility];  Service: Endoscopy;;  . RIGHT OOPHORECTOMY    . TEE WITHOUT CARDIOVERSION N/A 02/20/2017   Procedure: TRANSESOPHAGEAL ECHOCARDIOGRAM (TEE);  Surgeon: Grace Isaac, MD;  Location: [facility];  Service: Open Heart Surgery;  Laterality: N/A;    There were no vitals filed for this visit.  Subjective Assessment - 12/06/17 1828    Subjective  Patient reports she is compliant with HEP and continues with chief concern of LBP.     Pertinent History  Patient with recent Occipital CVA 10/22/2017. Admitted to hospital for  treatment 10/22/17 - 10/24/2017 and is imrpoving with symtpoms. She is now referred to out patient PT for treatment. long history of back pain with arthritis with episodes of exacerbation for at least the past 10 years. Previous therapy for back pain with good results for pain control. Recently had MI with CABG x 3 02/20/2017 and she is recuperating from this and has been more sedentary.     Limitations  Walking;Sitting;Standing;House hold activities transfer in/out of car; personal care    How long can you sit comfortably?  < 20 min.    How long can you stand comfortably?  unable to tolerated standing at all in  one position    How long can you walk comfortably?  walking is painful and she has to exercise due to recent MI and CVA    Currently in Pain?  Yes    Pain Score  4     Pain Location  Back    Pain Orientation  Lower    Pain Descriptors / Indicators  Aching;Spasm;Sore    Pain Onset  More than a month ago    Pain Frequency  Intermittent        Objective: AROM: left LE: full isolated hip flexion, knee extension, flexion, ankle DF/PF; all assessed in sitting position Palpation: spasms with tenderness along bilateral thoracic spine and lumbar paraspinal muscles improved from previous session   Treatment:  Manual therapy: 23 min.goal: pain, spasm  patient prone with pillow under lower legs and head supported in cradle: STM performed to upper and lower spine paraspinal muscles, superficial techniques   Modalities: Electrical stimulation: 15 min High volt estim.clincial program for muscle spasms (4) electrodes applied to lower lumbar spine, intensity to tolerance with patient in prone position with LEs supported on pillow with moist heat applied to same;  goal: pain, spasms   Patient response to treatment:  decreased tenderness, spasms by 50% following STM and estim. No adverse reaction to moist heat noted       PT Education - 12/06/17 1900    Education provided  Yes    Education Details  home program re assessed    Person(s) Educated  Patient    Methods  Explanation    Comprehension  Verbalized understanding          PT Long Term Goals - 11/08/17 1932      PT LONG TERM GOAL #1   Title  improve FOTO by 10 points demonstrating improved function with daily tasks    Baseline  FOTO    Status  New    Target Date  12/07/17      PT LONG TERM GOAL #2   Title  improve FOTO by 20 points demonstrating improved function with daily tasks    Baseline  --    Status  New    Target Date  01/04/18      PT LONG TERM GOAL #3   Title  patient will be independent with home program for pain  control, exercises in order to transition to self management once discharged from physical therapy    Baseline  limited knowledge of appropriate pain control, exercise progression without guidance, cuing    Status  New    Target Date  01/04/18            Plan - 12/06/17 1921    Clinical Impression Statement  Patient continues with primary limitation of back pain that limits function with daily activities. She is responding slowly to treatment with  temporary relieft of symptoms. She is compliant with home program.     Rehab Potential  Fair    Clinical Impairments Affecting Rehab Potential  (+)motivated, acute on chronic pain, active lifestyle prior to this episode(-)chronic condition, pain, recent CABG x 3 02/20/2017, arthritis, HTN, CVA     PT Frequency  1x / week    PT Duration  12 weeks    PT Treatment/Interventions  Cryotherapy;Electrical Stimulation;Moist Heat;Therapeutic exercise;Therapeutic activities;Neuromuscular re-education;Patient/family education;Manual techniques;Balance training    PT Next Visit Plan  pain control, exercise progression as needed    PT Home Exercise Plan  pain cotnrol, body mechanics, use of lumbar support for sitting, sleeping; basic core strengthening sitting, knee flexion        Patient will benefit from skilled therapeutic intervention in order to improve the following deficits and impairments:  Pain, Improper body mechanics, Increased muscle spasms, Decreased activity tolerance, Decreased endurance, Decreased range of motion, Decreased strength, Impaired perceived functional ability  Visit Diagnosis: Muscle weakness (generalized)  Low back pain with sciatica, sciatica laterality unspecified, unspecified back pain laterality, unspecified chronicity  Muscle spasm of back  Difficulty in walking, not elsewhere classified     Problem List Patient Active Problem List   Diagnosis Date Noted  . CVA (cerebral vascular accident) ([place]) 10/22/2017  .  Abnormal nuclear stress test 05/24/2017  . 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 PAF (paroxysmal atrial fibrillation) ([place])   . Non-STEMI (non-ST elevated myocardial infarction) ([place]) 02/20/2017  . 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 Moderate mitral insufficiency 01/11/2017  . Personal history of colonic polyps   . Benign neoplasm of ascending colon   . Benign neoplasm of descending colon   . Chest pain, moderate coronary artery risk 09/16/2015  . Disequilibrium 09/16/2015  . Panic attacks 09/11/2015  . Ganglion cyst 08/16/2015  . Hypernatremia 07/04/2015  . Upper back pain 07/04/2015  . Other osteoarthritis involving multiple joints 02/06/2015  . Clinical depression 09/27/2014  . Dry mouth 09/27/2014  . Fibrositis 09/27/2014  . LBP (low back pain) 09/27/2014  . Avitaminosis D 09/27/2014  . Decreased leukocytes 09/27/2014  . Abnormal blood sugar 05/03/2009  . Insomnia 09/29/2007  . HTN (hypertension) 03/09/2007  . Adaptation reaction 01/29/2007  . Acid reflux 10/07/2006  . Menopausal and postmenopausal disorder 10/05/2006  . Headache, migraine 10/05/2006  . Arthritis, degenerative 03/23/1994  . Adult hypothyroidism 04/15/1993  . Hyperlipidemia LDL goal <70 03/25/1993    Jomarie Longs PT 12/07/2017, 11:44 PM  [facility] [address] [place], [address] Phone: [phone]   Fax:  [phone]  Name: Stacey [name] MRN: [id number] Date of Birth: 11/28/1940

## 2017-12-08 ENCOUNTER — Encounter: Payer: Medicare Other | Admitting: Physical Therapy

## 2017-12-13 ENCOUNTER — Ambulatory Visit: Payer: Medicare Other | Admitting: Physical Therapy

## 2017-12-13 ENCOUNTER — Encounter: Payer: Self-pay | Admitting: Physical Therapy

## 2017-12-13 DIAGNOSIS — M5440 Lumbago with sciatica, unspecified side: Secondary | ICD-10-CM | POA: Diagnosis not present

## 2017-12-13 DIAGNOSIS — M62830 Muscle spasm of back: Secondary | ICD-10-CM | POA: Diagnosis not present

## 2017-12-13 DIAGNOSIS — M6281 Muscle weakness (generalized): Secondary | ICD-10-CM

## 2017-12-13 DIAGNOSIS — L812 Freckles: Secondary | ICD-10-CM | POA: Diagnosis not present

## 2017-12-13 DIAGNOSIS — R262 Difficulty in walking, not elsewhere classified: Secondary | ICD-10-CM | POA: Diagnosis not present

## 2017-12-13 DIAGNOSIS — Z872 Personal history of diseases of the skin and subcutaneous tissue: Secondary | ICD-10-CM | POA: Diagnosis not present

## 2017-12-13 DIAGNOSIS — L578 Other skin changes due to chronic exposure to nonionizing radiation: Secondary | ICD-10-CM | POA: Diagnosis not present

## 2017-12-13 DIAGNOSIS — L719 Rosacea, unspecified: Secondary | ICD-10-CM | POA: Diagnosis not present

## 2017-12-13 DIAGNOSIS — D229 Melanocytic nevi, unspecified: Secondary | ICD-10-CM | POA: Diagnosis not present

## 2017-12-13 DIAGNOSIS — D1801 Hemangioma of skin and subcutaneous tissue: Secondary | ICD-10-CM | POA: Diagnosis not present

## 2017-12-13 DIAGNOSIS — Z85828 Personal history of other malignant neoplasm of skin: Secondary | ICD-10-CM | POA: Diagnosis not present

## 2017-12-13 DIAGNOSIS — D225 Melanocytic nevi of trunk: Secondary | ICD-10-CM | POA: Diagnosis not present

## 2017-12-13 DIAGNOSIS — L821 Other seborrheic keratosis: Secondary | ICD-10-CM | POA: Diagnosis not present

## 2017-12-13 DIAGNOSIS — D692 Other nonthrombocytopenic purpura: Secondary | ICD-10-CM | POA: Diagnosis not present

## 2017-12-13 DIAGNOSIS — M25551 Pain in right hip: Secondary | ICD-10-CM | POA: Diagnosis not present

## 2017-12-13 DIAGNOSIS — L820 Inflamed seborrheic keratosis: Secondary | ICD-10-CM | POA: Diagnosis not present

## 2017-12-13 DIAGNOSIS — D2230 Melanocytic nevi of unspecified part of face: Secondary | ICD-10-CM | POA: Diagnosis not present

## 2017-12-13 NOTE — Therapy (Signed)
[facility] [address], [place], [address] Phone: [phone]   Fax:  [phone]  Physical Therapy Treatment  Patient Details  Name: Stacey [name] MRN: [id number] Date of Birth: Feb 26, 1941 Referring Provider: Miguel Aschoff   Encounter Date: 12/13/2017  PT End of Session - 12/13/17 1923    Visit Number  6    Number of Visits  12    Date for PT Re-Evaluation  12/20/17    Authorization Time Period  6/10 progress    PT Start Time  1820    PT Stop Time  1900    PT Time Calculation (min)  40 min    Activity Tolerance  Patient tolerated treatment well    Behavior During Therapy  [facility] for tasks assessed/performed       Past Medical History:  Diagnosis Date  . Abnormal nuclear stress test 05/24/2017  . Arthritis   . Asthma   . Atrial fibrillation, transient ([place])   . 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 Cancer ([place])   . Complication of anesthesia   . Depression   . Family history of adverse reaction to anesthesia    most of family - PONV  . Fibromyalgia   . Glaucoma   . Headache    migraines - 1-2x/mo  . HTN (hypertension) 03/09/2007  . Hyperlipidemia LDL goal <70 03/25/1993  . Hypertension   . Motion sickness    all moving vehicles  . PONV (postoperative nausea and vomiting)   . Thyroid disease     Past Surgical History:  Procedure Laterality Date  . ABDOMINAL HYSTERECTOMY    . APPENDECTOMY    . BLADDER SURGERY    . CATARACT EXTRACTION W/ INTRAOCULAR LENS  IMPLANT, BILATERAL    . COLONOSCOPY WITH PROPOFOL N/A 01/17/2016   Procedure: COLONOSCOPY WITH PROPOFOL;  Surgeon: [name], MD;  Location: [place];  Service: Endoscopy;  Laterality: N/A;  . CORONARY ARTERY BYPASS GRAFT N/A 02/20/2017   Procedure: CORONARY ARTERY BYPASS GRAFTING (CABG) x , three using left internal mammary artery to left anterior descending coronary artery  and right greater saphenous vein harvested endoscopically to distal right and diagonal coronary arteries.;  Surgeon: Grace Isaac, MD;  Location: [place];  Service: Open Heart Surgery;  Laterality: N/A;  . LEFT HEART CATH AND CORONARY ANGIOGRAPHY N/A 02/20/2017   Procedure: LEFT HEART CATH AND CORONARY ANGIOGRAPHY;  Surgeon: Isaias Cowman, MD;  Location: [place] [facility];  Service: Cardiovascular;  Laterality: N/A;  . LEFT HEART CATH AND CORS/GRAFTS ANGIOGRAPHY N/A 05/24/2017   Procedure: LEFT HEART CATH AND CORS/GRAFTS ANGIOGRAPHY;  Surgeon: Sherren Mocha, MD;  Location: [place] [facility];  Service: Cardiovascular;  Laterality: N/A;  . NASAL SINUS SURGERY    . POLYPECTOMY  01/17/2016   Procedure: POLYPECTOMY;  Surgeon: [name], MD;  Location: [place];  Service: Endoscopy;;  . RIGHT OOPHORECTOMY    . TEE WITHOUT CARDIOVERSION N/A 02/20/2017   Procedure: TRANSESOPHAGEAL ECHOCARDIOGRAM (TEE);  Surgeon: Grace Isaac, MD;  Location: [place];  Service: Open Heart Surgery;  Laterality: N/A;    There were no vitals filed for this visit.  Subjective Assessment - 12/13/17 1822    Subjective  Patient reports she continues with back pain and is improving with left leg weakness.     Pertinent History  Patient with recent [place] CVA 10/22/2017. Admitted to hospital for  treatment 10/22/17 - 10/24/2017 and is imrpoving with symtpoms. She is now referred to out patient PT for treatment. long history of back pain with arthritis with episodes of exacerbation for at least the past 10 years. Previous therapy for back pain with good results for pain control. Recently had MI with CABG x 3 02/20/2017 and she is recuperating from this and has been more sedentary.     Limitations  Walking;Sitting;Standing;House hold activities transfer in/out of car; personal care    How long can you sit comfortably?  < 20 min.    How long can you stand comfortably?  unable to tolerated standing at all in  one position    How long can you walk comfortably?  walking is painful and she has to exercise due to recent MI and CVA    Currently in Pain?  Yes    Pain Score  4     Pain Location  Back    Pain Orientation  Lower    Pain Descriptors / Indicators  Aching;Sore    Pain Onset  More than a month ago    Pain Frequency  Intermittent         Objective: Palpation: spasms with tenderness along bilateral thoracic spine and lumbar paraspinal muscles improved from previous session   Treatment:  Manual therapy: 25 min.goal: pain, spasm  patient prone with pillow under lower legs and head supported in cradle: STM performed to upper and lower spine paraspinal muscles, superficial techniques  Therapeutic exercise: performed with instruction, demonstration, verbal cues of therapist; goal: strenth; independent with home program core strengthening in standing; using 3# weight; performed forward flexion overhead  x 10; rotation of trunk x 10, side bend right and left partial ROM x 10   Patient response to treatment:  Patient demonstrated decreased tenderness, spasms by 50% following manual techniques; improved ability to perform exercises with modification of shortened arcs of motion.     PT Education - 12/13/17 1922    Education provided  Yes    Education Details  HEP: added core strengthening in standing; forward flexion overhead 3# weight x 10; rotation of trunk x 10, side bend right and left partial ROM x 10    Person(s) Educated  Patient    Methods  Explanation;Demonstration;Verbal cues    Comprehension  Verbal cues required;Returned demonstration;Verbalized understanding          PT Long Term Goals - 11/08/17 1932      PT LONG TERM GOAL #1   Title  improve FOTO by 10 points demonstrating improved function with daily tasks    Baseline  FOTO    Status  New    Target Date  12/07/17      PT LONG TERM GOAL #2   Title  improve FOTO by 20 points demonstrating improved function with daily  tasks    Baseline  --    Status  New    Target Date  01/04/18      PT LONG TERM GOAL #3   Title  patient will be independent with home program for pain control, exercises in order to transition to self management once discharged from physical therapy    Baseline  limited knowledge of appropriate pain control, exercise progression without guidance, cuing    Status  New    Target Date  01/04/18          Plan - 12/13/17 1926    Clinical Impression Statement  Patient is progressing with improved ROM and  strength left LE and continues with chronic back pain. She is able to exercise with increased pain noted when performing exercise through full ROM of trunk rotation/side bending; improved with modifigying through short arc of motion.     Rehab Potential  Fair    Clinical Impairments Affecting Rehab Potential  (+)motivated, acute on chronic pain, active lifestyle prior to this episode(-)chronic condition, pain, recent CABG x 3 02/20/2017, arthritis, HTN, CVA     PT Frequency  1x / week    PT Duration  12 weeks    PT Treatment/Interventions  Cryotherapy;Electrical Stimulation;Moist Heat;Therapeutic exercise;Therapeutic activities;Neuromuscular re-education;Patient/family education;Manual techniques;Balance training    PT Next Visit Plan  pain control, exercise progression as needed    PT Home Exercise Plan  pain cotnrol, body mechanics, use of lumbar support for sitting, sleeping; basic core strengthening sitting, knee flexion        Patient will benefit from skilled therapeutic intervention in order to improve the following deficits and impairments:  Pain, Improper body mechanics, Increased muscle spasms, Decreased activity tolerance, Decreased endurance, Decreased range of motion, Decreased strength, Impaired perceived functional ability  Visit Diagnosis: Muscle weakness (generalized)  Low back pain with sciatica, sciatica laterality unspecified, unspecified back pain laterality,  unspecified chronicity  Muscle spasm of back     Problem List Patient Active Problem List   Diagnosis Date Noted  . CVA (cerebral vascular accident) ([place]) 10/22/2017  . Abnormal nuclear stress test 05/24/2017  . 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 PAF (paroxysmal atrial fibrillation) ([place])   . Non-STEMI (non-ST elevated myocardial infarction) ([place]) 02/20/2017  . 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 Moderate mitral insufficiency 01/11/2017  . Personal history of colonic polyps   . Benign neoplasm of ascending colon   . Benign neoplasm of descending colon   . Chest pain, moderate coronary artery risk 09/16/2015  . Disequilibrium 09/16/2015  . Panic attacks 09/11/2015  . Ganglion cyst 08/16/2015  . Hypernatremia 07/04/2015  . Upper back pain 07/04/2015  . Other osteoarthritis involving multiple joints 02/06/2015  . Clinical depression 09/27/2014  . Dry mouth 09/27/2014  . Fibrositis 09/27/2014  . LBP (low back pain) 09/27/2014  . Avitaminosis D 09/27/2014  . Decreased leukocytes 09/27/2014  . Abnormal blood sugar 05/03/2009  . Insomnia 09/29/2007  . HTN (hypertension) 03/09/2007  . Adaptation reaction 01/29/2007  . Acid reflux 10/07/2006  . Menopausal and postmenopausal disorder 10/05/2006  . Headache, migraine 10/05/2006  . Arthritis, degenerative 03/23/1994  . Adult hypothyroidism 04/15/1993  . Hyperlipidemia LDL goal <70 03/25/1993    Jomarie Longs PT 12/13/2017, 7:27 PM  [facility] [address] [place], [address] Phone: [phone]   Fax:  [phone]  Name: ANGELIA HAZELL MRN: [id number] Date of Birth: 1940-10-15

## 2017-12-15 ENCOUNTER — Encounter: Payer: Medicare Other | Admitting: Physical Therapy

## 2017-12-20 ENCOUNTER — Ambulatory Visit (INDEPENDENT_AMBULATORY_CARE_PROVIDER_SITE_OTHER): Payer: Medicare Other | Admitting: Family Medicine

## 2017-12-20 ENCOUNTER — Encounter: Payer: Self-pay | Admitting: Physical Therapy

## 2017-12-20 ENCOUNTER — Ambulatory Visit: Payer: Medicare Other | Admitting: Physical Therapy

## 2017-12-20 ENCOUNTER — Ambulatory Visit (INDEPENDENT_AMBULATORY_CARE_PROVIDER_SITE_OTHER): Payer: Medicare Other

## 2017-12-20 VITALS — BP 130/66 | HR 78 | Temp 98.6°F | Ht 62.0 in | Wt 170.0 lb

## 2017-12-20 VITALS — BP 130/66 | HR 78 | Temp 98.6°F | Resp 16 | Wt 170.0 lb

## 2017-12-20 DIAGNOSIS — I639 Cerebral infarction, unspecified: Secondary | ICD-10-CM

## 2017-12-20 DIAGNOSIS — I25709 Atherosclerosis of coronary artery bypass graft(s), unspecified, with unspecified angina pectoris: Secondary | ICD-10-CM | POA: Diagnosis not present

## 2017-12-20 DIAGNOSIS — T464X5A Adverse effect of angiotensin-converting-enzyme inhibitors, initial encounter: Secondary | ICD-10-CM | POA: Diagnosis not present

## 2017-12-20 DIAGNOSIS — M5440 Lumbago with sciatica, unspecified side: Secondary | ICD-10-CM | POA: Diagnosis not present

## 2017-12-20 DIAGNOSIS — Z0000 Encounter for general adult medical examination without abnormal findings: Secondary | ICD-10-CM

## 2017-12-20 DIAGNOSIS — M6281 Muscle weakness (generalized): Secondary | ICD-10-CM

## 2017-12-20 DIAGNOSIS — R058 Other specified cough: Secondary | ICD-10-CM

## 2017-12-20 DIAGNOSIS — R05 Cough: Secondary | ICD-10-CM | POA: Diagnosis not present

## 2017-12-20 DIAGNOSIS — F4323 Adjustment disorder with mixed anxiety and depressed mood: Secondary | ICD-10-CM | POA: Diagnosis not present

## 2017-12-20 DIAGNOSIS — R262 Difficulty in walking, not elsewhere classified: Secondary | ICD-10-CM

## 2017-12-20 DIAGNOSIS — M62830 Muscle spasm of back: Secondary | ICD-10-CM

## 2017-12-20 DIAGNOSIS — M25551 Pain in right hip: Secondary | ICD-10-CM

## 2017-12-20 NOTE — Progress Notes (Signed)
Subjective:   Stacey [name] is a 76 y.o. female who presents for Medicare Annual (Subsequent) preventive examination.  Review of Systems:  N/A  Cardiac Risk Factors include: hypertension;dyslipidemia;advanced age (>75men, >102 women);obesity (BMI >30kg/m2)     Objective:     Vitals: BP 130/66 (BP Location: Right Arm)   Pulse 78   Temp 98.6 F (37 C) (Oral)   Ht 5\' 2"  (1.575 m)   Wt 170 lb (77.1 kg)   BMI 31.09 kg/m   Body mass index is 31.09 kg/m.  Advanced Directives 12/20/2017 10/22/2017 08/05/2017 05/22/2017 02/20/2017 12/17/2016 01/17/2016  Does Patient Have a Medical Advance Directive? Yes No Yes No No Yes Yes  Type of Paramedic of [place];Living will - [place] -  Does patient want to make changes to medical advance directive? - - - - - - Yes - information given  Copy of [place] in Chart? No - copy requested - - - - No - copy requested -  Would patient like information on creating a medical advance directive? - - - No - Patient declined No - Patient declined - -    Tobacco Social History   Tobacco Use  Smoking Status Former Smoker  . Last attempt to quit: 05/26/1975  . Years since quitting: 42.6  Smokeless Tobacco Never Used  Tobacco Comment   quit 45  years ago      Counseling given: Not Answered Comment: quit 45  years ago    Clinical Intake:  Pre-visit preparation completed: Yes  Pain : 0-10 Pain Score: 6  Pain Type: Chronic pain Pain Location: Back Pain Orientation: (all of back) Pain Descriptors / Indicators: Aching, Dull Pain Frequency: Constant     Nutritional Status: BMI > 30  Obese Nutritional Risks: Nausea/ vomitting/ diarrhea(nausea occasionally due to diet) Diabetes: No  How often do you need to have someone help you when you read instructions, pamphlets, or other written materials from your doctor or pharmacy?: 1 -  Never  Interpreter Needed?: No  Information entered by :: [facility], LPN  Past Medical History:  Diagnosis Date  . Abnormal nuclear stress test 05/24/2017  . Arthritis   . Asthma   . Atrial fibrillation, transient ([place])   . 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 Cancer ([place])   . Complication of anesthesia   . Depression   . Family history of adverse reaction to anesthesia    most of family - PONV  . Fibromyalgia   . Glaucoma   . Headache    migraines - 1-2x/mo  . HTN (hypertension) 03/09/2007  . Hyperlipidemia LDL goal <70 03/25/1993  . Hypertension   . Motion sickness    all moving vehicles  . PONV (postoperative nausea and vomiting)   . Thyroid disease    Past Surgical History:  Procedure Laterality Date  . ABDOMINAL HYSTERECTOMY    . APPENDECTOMY    . BLADDER SURGERY    . CATARACT EXTRACTION W/ INTRAOCULAR LENS  IMPLANT, BILATERAL    . COLONOSCOPY WITH PROPOFOL N/A 01/17/2016   Procedure: COLONOSCOPY WITH PROPOFOL;  Surgeon: [name], MD;  Location: [place];  Service: Endoscopy;  Laterality: N/A;  . CORONARY ARTERY BYPASS GRAFT N/A 02/20/2017   Procedure: CORONARY ARTERY BYPASS GRAFTING (CABG) x , three using left internal mammary artery to left anterior descending coronary artery and right  greater saphenous vein harvested endoscopically to distal right and diagonal coronary arteries.;  Surgeon: Grace Isaac, MD;  Location: [facility];  Service: Open Heart Surgery;  Laterality: N/A;  . LEFT HEART CATH AND CORONARY ANGIOGRAPHY N/A 02/20/2017   Procedure: LEFT HEART CATH AND CORONARY ANGIOGRAPHY;  Surgeon: Isaias Cowman, MD;  Location: [place] [facility];  Service: Cardiovascular;  Laterality: N/A;  . LEFT HEART CATH AND CORS/GRAFTS ANGIOGRAPHY N/A 05/24/2017   Procedure: LEFT HEART CATH AND CORS/GRAFTS ANGIOGRAPHY;  Surgeon: Sherren Mocha, MD;  Location: [place];  Service: Cardiovascular;   Laterality: N/A;  . NASAL SINUS SURGERY    . POLYPECTOMY  01/17/2016   Procedure: POLYPECTOMY;  Surgeon: [name], MD;  Location: [place];  Service: Endoscopy;;  . RIGHT OOPHORECTOMY    . TEE WITHOUT CARDIOVERSION N/A 02/20/2017   Procedure: TRANSESOPHAGEAL ECHOCARDIOGRAM (TEE);  Surgeon: Grace Isaac, MD;  Location: [place];  Service: Open Heart Surgery;  Laterality: N/A;   Family History  Problem Relation Age of Onset  . Alzheimer's disease Mother   . Kidney cancer Mother   . Heart attack Father   . Pancreatic cancer Sister   . Heart attack Brother 57  . Ovarian cancer Sister   . Kidney disease Sister        dialysis  . Cancer Sister        uterin  . Diabetes Sister   . Heart attack Sister   . Heart attack Brother 4  . Ovarian cancer Sister    Social History   Socioeconomic History  . Marital status: Married    Spouse name: [name]  . Number of children: 3  . Years of education: Not on file  . Highest education level: GED or equivalent  Occupational History    Employer: [employer]  . Financial resource strain: Not hard at all  . Food insecurity:    Worry: Never true    Inability: Never true  . Transportation needs:    Medical: No    Non-medical: No  Tobacco Use  . Smoking status: Former Smoker    Last attempt to quit: 05/26/1975    Years since quitting: 42.6  . Smokeless tobacco: Never Used  . Tobacco comment: quit 45  years ago   Substance and Sexual Activity  . Alcohol use: No  . Drug use: No  . Sexual activity: Never  Lifestyle  . Physical activity:    Days per week: Not on file    Minutes per session: Not on file  . Stress: Only a little  Relationships  . Social connections:    Talks on phone: Not on file    Gets together: Not on file    Attends religious service: Not on file    Active member of club or organization: Not on file    Attends meetings of clubs or organizations: Not on file    Relationship status:  Not on file  Other Topics Concern  . Not on file  Social History Narrative  . Not on file    Outpatient Encounter Medications as of 12/20/2017  Medication Sig  . acetaminophen (TYLENOL) 325 MG tablet Take 2 tablets (650 mg total) by mouth every 6 (six) hours as needed for mild pain.  Marland Kitchen albuterol (VENTOLIN HFA) 108 (90 Base) MCG/ACT inhaler INHALE 2 PUFFS BY MOUTH EVERY 4 TO 6 HOURS AS NEEDED FOR SHORTNESS OF BREATH  . aspirin 81 MG tablet Take 1 tablet (81  mg total) by mouth daily.  Marland Kitchen buPROPion (WELLBUTRIN XL) 300 MG 24 hr tablet Take 1 tablet (300 mg total) by mouth daily.  . Cholecalciferol (VITAMIN D) 2000 units tablet Take 2,000 Units by mouth daily.  . Cyanocobalamin (B-12) 500 MCG TABS Take by mouth daily.   . diazepam (VALIUM) 5 MG tablet Take 1 tablet (5 mg total) by mouth every 12 (twelve) hours as needed for anxiety. (Patient taking differently: Take 5 mg by mouth daily as needed for anxiety. )  . EPINEPHrine (EPIPEN 2-PAK) 0.3 mg/0.3 mL IJ SOAJ injection INJECT AS DIRECTED FOR SEVERE ALLERGIC REACTION  . hydrochlorothiazide (HYDRODIURIL) 12.5 MG tablet Take 12.5 mg by mouth daily.   Marland Kitchen levothyroxine (SYNTHROID, LEVOTHROID) 100 MCG tablet Take 1 tablet (100 mcg total) by mouth daily.  Marland Kitchen lisinopril (PRINIVIL,ZESTRIL) 10 MG tablet Take 1 tablet (10 mg total) by mouth daily.  . metoprolol tartrate (LOPRESSOR) 25 MG tablet Take 1 tablet (25 mg total) by mouth 2 (two) times daily.  . metroNIDAZOLE (METROGEL) 0.75 % gel APPLY ON THE SKIN AS DIRECTED 1-2 TIMES A DAY TO FACE  . Multiple Vitamin (MULTI-VITAMINS) TABS Take 1 tablet by mouth daily.  . rosuvastatin (CRESTOR) 40 MG tablet Take 0.5 tablets (20 mg total) by mouth daily.  . tacrolimus (PROTOPIC) 0.1 % ointment APPLY TO AFFECTED AREA TWICE A DAY  . traMADol (ULTRAM) 50 MG tablet Take 1 tablet (50 mg total) by mouth every 6 (six) hours as needed for moderate pain.  Marland Kitchen zolpidem (AMBIEN) 5 MG tablet Take 1 tablet (5 mg total) by mouth  at bedtime as needed. for sleep  . ALPRAZolam (XANAX) 0.25 MG tablet Take 1/2-1 tablet every 8 hours as needed for anxiety. (Patient not taking: Reported on 12/20/2017)  . amLODipine (NORVASC) 5 MG tablet Take 1 tablet (5 mg total) daily by mouth. (Patient not taking: Reported on 12/20/2017)  . apixaban (ELIQUIS) 5 MG TABS tablet Take 1 tablet (5 mg total) by mouth 2 (two) times daily. (Patient not taking: Reported on 12/20/2017)  . capsaicin (ZOSTRIX) 0.025 % cream Apply 1 application topically as needed (nerve pain in chest).  Marland Kitchen oxymetazoline (AFRIN) 0.05 % nasal spray Place 1 spray into both nostrils as needed for congestion.   No facility-administered encounter medications on file as of 12/20/2017.     Activities of Daily Living In your present state of health, do you have any difficulty performing the following activities: 12/20/2017 10/22/2017  Hearing? N N  Vision? Y N  Comment Has had trouble with vision since stroke last month.  -  Difficulty concentrating or making decisions? N N  Walking or climbing stairs? N N  Dressing or bathing? N N  Doing errands, shopping? N N  Preparing Food and eating ? N -  Using the Toilet? N -  In the past six months, have you accidently leaked urine? N -  Do you have problems with loss of bowel control? N -  Managing your Medications? N -  Managing your Finances? N -  Housekeeping or managing your Housekeeping? N -  Some recent data might be hidden    Patient Care Team: [name], MD as PCP - General (Family Medicine) Leandrew Koyanagi, MD as Referring Physician (Ophthalmology) Carmon Ginsberg, [place] as Referring Physician (Family Medicine) Corey Skains, MD as Consulting Physician (Cardiology) Eulogio Bear, MD as Consulting Physician (Ophthalmology)    Assessment:   This is a routine wellness examination for [place].  Exercise Activities  and Dietary recommendations Current Exercise Habits: Home exercise routine(does  back stretches every day), Type of exercise: walking;stretching, Time (Minutes): 60, Frequency (Times/Week): 3, Weekly Exercise (Minutes/Week): 180, Intensity: Mild, Exercise limited by: orthopedic condition(s)  Goals    . DIET - REDUCE FAT INTAKE     Recommend cutting back on fatty foods in diet to help aid in weight loss.     . Exercise 150 minutes per week (moderate activity)       Fall Risk Fall Risk  12/20/2017 12/17/2016 12/04/2015 11/30/2014  Falls in the past year? No No No Yes  Number falls in past yr: - - - 2 or more  Injury with Fall? - - - Yes   Is the patient's home free of loose throw rugs in walkways, pet beds, electrical cords, etc?   yes      Grab bars in the bathroom? yes      Handrails on the stairs?   yes      Adequate lighting?   yes  Timed Get Up and Go performed: N/A  Depression Screen PHQ 2/9 Scores 12/20/2017 04/27/2017 03/22/2017 12/17/2016  PHQ - 2 Score 2 3 6  0  PHQ- 9 Score 15 10 19 6      Cognitive Function: Pt declined screening today.      6CIT Screen 12/17/2016  What Year? 0 points  What month? 0 points  What time? 0 points  Count back from 20 0 points  Months in reverse 0 points  Repeat phrase 2 points  Total Score 2    Immunization History  Administered Date(s) Administered  . Influenza, High Dose Seasonal PF 02/17/2017  . Influenza,inj,Quad PF,6+ Mos 01/24/2015  . Influenza-Unspecified 02/16/2017  . Pneumococcal Conjugate-13 11/22/2013  . Pneumococcal Polysaccharide-23 03/30/2005, 11/19/2011  . Tdap 10/01/2005  . Zoster 11/19/2011    Qualifies for Shingles Vaccine? Due for Shingles vaccine. Declined my offer to administer today. Education has been provided regarding the importance of this vaccine. Pt has been advised to call her insurance company to determine her out of pocket expense. Advised she may also receive this vaccine at her local pharmacy or Health Dept. Verbalized acceptance and understanding.  Screening Tests Health  Maintenance  Topic Date Due  . MAMMOGRAM  12/16/2016  . INFLUENZA VACCINE  12/23/2017  . TETANUS/TDAP  09/06/2025  . COLONOSCOPY  01/16/2026  . DEXA SCAN  Completed  . PNA vac Low Risk Adult  Completed    Cancer Screenings: Lung: Low Dose CT Chest recommended if Age 65-80 years, 30 pack-year currently smoking OR have quit w/in 15years. Patient does not qualify. Breast:  Up to date on Mammogram? No, pt to set up apt this fall.   Up to date of Bone Density/Dexa? Yes Colorectal: Up to date   Additional Screenings:  Hepatitis C Screening: N/A     Plan:  I have personally reviewed and addressed the Medicare Annual Wellness questionnaire and have noted the following in the patient's chart:  A. Medical and social history B. Use of alcohol, tobacco or illicit drugs  C. Current medications and supplements D. Functional ability and status E.  Nutritional status F.  Physical activity G. Advance directives H. List of other physicians I.  Hospitalizations, surgeries, and ER visits in previous 12 months J.  [place] such as hearing and vision if needed, cognitive and depression L. Referrals and appointments - none  In addition, I have reviewed and discussed with patient certain preventive protocols, quality metrics, and best practice  recommendations. A written personalized care plan for preventive services as well as general preventive health recommendations were provided to patient.  See attached scanned questionnaire for additional information.   Signed,  Fabio Neighbors, LPN Nurse Health Advisor   Nurse Recommendations: None, pt to set up a mammogram this fall.

## 2017-12-20 NOTE — Progress Notes (Signed)
Stacey [name]  MRN: [id number] DOB: 17-Oct-1940  Subjective:  HPI   The patient is a 77 year old female who presents for follow up of her chronic health.  She was seen by the nurse health adviser prior to this visit.  Pt says she has had a hacking cough for past 4 weeks--no other symptoms. She still has family issues with multiple siblings with major medical issues.  Patient Active Problem List   Diagnosis Date Noted  . CVA (cerebral vascular accident) ([place]) 10/22/2017  . Abnormal nuclear stress test 05/24/2017  . 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 PAF (paroxysmal atrial fibrillation) ([place])   . Non-STEMI (non-ST elevated myocardial infarction) ([place]) 02/20/2017  . 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 Moderate mitral insufficiency 01/11/2017  . Personal history of colonic polyps   . Benign neoplasm of ascending colon   . Benign neoplasm of descending colon   . Chest pain, moderate coronary artery risk 09/16/2015  . Disequilibrium 09/16/2015  . Panic attacks 09/11/2015  . Ganglion cyst 08/16/2015  . Hypernatremia 07/04/2015  . Upper back pain 07/04/2015  . Other osteoarthritis involving multiple joints 02/06/2015  . Clinical depression 09/27/2014  . Dry mouth 09/27/2014  . Fibrositis 09/27/2014  . LBP (low back pain) 09/27/2014  . Avitaminosis D 09/27/2014  . Decreased leukocytes 09/27/2014  . Abnormal blood sugar 05/03/2009  . Insomnia 09/29/2007  . HTN (hypertension) 03/09/2007  . Adaptation reaction 01/29/2007  . Acid reflux 10/07/2006  . Menopausal and postmenopausal disorder 10/05/2006  . Headache, migraine 10/05/2006  . Arthritis, degenerative 03/23/1994  . Adult hypothyroidism 04/15/1993  . Hyperlipidemia LDL goal <70 03/25/1993    Past Medical History:  Diagnosis Date  . Abnormal nuclear stress test 05/24/2017  . Arthritis   . Asthma   . Atrial fibrillation, transient ([place])   . 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 Cancer ([place])   . Complication of anesthesia   . Depression   . Family history of adverse reaction to anesthesia    most of family - PONV  . Fibromyalgia   . Glaucoma   . Headache    migraines - 1-2x/mo  . HTN (hypertension) 03/09/2007  . Hyperlipidemia LDL goal <70 03/25/1993  . Hypertension   . Motion sickness    all moving vehicles  . PONV (postoperative nausea and vomiting)   . Thyroid disease     Social History   Socioeconomic History  . Marital status: Married    Spouse name: [name]  . Number of children: 3  . Years of education: Not on file  . Highest education level: GED or equivalent  Occupational History    Employer: [employer]  . Financial resource strain: Not hard at all  . Food insecurity:    Worry: Never true    Inability: Never true  . Transportation needs:    Medical: No    Non-medical: No  Tobacco Use  . Smoking status: Former Smoker    Last attempt to quit: 05/26/1975    Years since quitting: 42.6  . Smokeless tobacco: Never Used  . Tobacco comment: quit 45  years ago   Substance and Sexual Activity  . Alcohol use: No  . Drug use: No  . Sexual activity: Never  Lifestyle  . Physical activity:    Days per week: Not on file    Minutes per session:  Not on file  . Stress: Only a little  Relationships  . Social connections:    Talks on phone: Not on file    Gets together: Not on file    Attends religious service: Not on file    Active member of club or organization: Not on file    Attends meetings of clubs or organizations: Not on file    Relationship status: Not on file  . Intimate partner violence:    Fear of current or ex partner: Not on file    Emotionally abused: Not on file    Physically abused: Not on file    Forced sexual activity: Not on file  Other Topics Concern  . Not on file  Social History Narrative  . Not on file    Outpatient Encounter Medications as of  12/20/2017  Medication Sig  . acetaminophen (TYLENOL) 325 MG tablet Take 2 tablets (650 mg total) by mouth every 6 (six) hours as needed for mild pain.  Marland Kitchen albuterol (VENTOLIN HFA) 108 (90 Base) MCG/ACT inhaler INHALE 2 PUFFS BY MOUTH EVERY 4 TO 6 HOURS AS NEEDED FOR SHORTNESS OF BREATH  . ALPRAZolam (XANAX) 0.25 MG tablet Take 1/2-1 tablet every 8 hours as needed for anxiety.  Marland Kitchen amLODipine (NORVASC) 5 MG tablet Take 1 tablet (5 mg total) daily by mouth.  Marland Kitchen apixaban (ELIQUIS) 5 MG TABS tablet Take 1 tablet (5 mg total) by mouth 2 (two) times daily.  Marland Kitchen aspirin 81 MG tablet Take 1 tablet (81 mg total) by mouth daily.  Marland Kitchen buPROPion (WELLBUTRIN XL) 300 MG 24 hr tablet Take 1 tablet (300 mg total) by mouth daily.  . capsaicin (ZOSTRIX) 0.025 % cream Apply 1 application topically as needed (nerve pain in chest).  . Cholecalciferol (VITAMIN D) 2000 units tablet Take 2,000 Units by mouth daily.  . Cyanocobalamin (B-12) 500 MCG TABS Take by mouth daily.   . diazepam (VALIUM) 5 MG tablet Take 1 tablet (5 mg total) by mouth every 12 (twelve) hours as needed for anxiety. (Patient taking differently: Take 5 mg by mouth daily as needed for anxiety. )  . EPINEPHrine (EPIPEN 2-PAK) 0.3 mg/0.3 mL IJ SOAJ injection INJECT AS DIRECTED FOR SEVERE ALLERGIC REACTION  . hydrochlorothiazide (HYDRODIURIL) 12.5 MG tablet Take 12.5 mg by mouth daily.   Marland Kitchen levothyroxine (SYNTHROID, LEVOTHROID) 100 MCG tablet Take 1 tablet (100 mcg total) by mouth daily.  Marland Kitchen lisinopril (PRINIVIL,ZESTRIL) 10 MG tablet Take 1 tablet (10 mg total) by mouth daily.  . metoprolol tartrate (LOPRESSOR) 25 MG tablet Take 1 tablet (25 mg total) by mouth 2 (two) times daily.  . metroNIDAZOLE (METROGEL) 0.75 % gel APPLY ON THE SKIN AS DIRECTED 1-2 TIMES A DAY TO FACE  . Multiple Vitamin (MULTI-VITAMINS) TABS Take 1 tablet by mouth daily.  Marland Kitchen oxymetazoline (AFRIN) 0.05 % nasal spray Place 1 spray into both nostrils as needed for congestion.  . rosuvastatin  (CRESTOR) 40 MG tablet Take 0.5 tablets (20 mg total) by mouth daily.  . tacrolimus (PROTOPIC) 0.1 % ointment APPLY TO AFFECTED AREA TWICE A DAY  . traMADol (ULTRAM) 50 MG tablet Take 1 tablet (50 mg total) by mouth every 6 (six) hours as needed for moderate pain.  Marland Kitchen zolpidem (AMBIEN) 5 MG tablet Take 1 tablet (5 mg total) by mouth at bedtime as needed. for sleep   No facility-administered encounter medications on file as of 12/20/2017.     Allergies  Allergen Reactions  . Cephalexin Anaphylaxis  . Atorvastatin   .  Morphine And Related Hives  . Procaine Other (See Comments)    Chest pain Chest pain  . Shellfish Allergy Swelling    angioedema  . Statins Other (See Comments)    Muscle pain  . Tomato Other (See Comments)    (by testing) - also beans, wheat, peas (by testing) - also beans, wheat, peas  . Penicillins Hives and Rash    Review of Systems  Constitutional: Negative.   HENT: Positive for congestion and sinus pain.        Mouth sores, PND, Runny nose, sneezing.  Eyes: Positive for photophobia.       Eyes itching  Respiratory: Positive for cough and sputum production.   Cardiovascular: Positive for palpitations and leg swelling.  Gastrointestinal: Positive for constipation, diarrhea and nausea.  Genitourinary: Positive for frequency.  Musculoskeletal: Positive for back pain, joint pain and neck pain.  Skin: Negative.   Neurological: Positive for headaches.       Hyperactive  Endo/Heme/Allergies: Bruises/bleeds easily.       Heat and Cold intolerance  Psychiatric/Behavioral:       Hyperactive    Objective:  BP 130/66 (BP Location: Right Arm, Patient Position: Sitting, Cuff Size: Normal)   Pulse 78   Temp 98.6 F (37 C) (Oral)   Resp 16   Wt 170 lb (77.1 kg)   BMI 31.09 kg/m   Physical Exam  Constitutional: She is oriented to person, place, and time and well-developed, well-nourished, and in no distress.  HENT:  Head: Normocephalic and atraumatic.  Left  Ear: External ear normal.  Nose: Nose normal.  Eyes: Conjunctivae are normal. No scleral icterus.  Neck: No thyromegaly present.  Cardiovascular: Normal rate, regular rhythm and normal heart sounds.  Pulmonary/Chest: Effort normal and breath sounds normal.  Abdominal: Soft.  Musculoskeletal: She exhibits no edema.  Neurological: She is alert and oriented to person, place, and time. Gait normal. GCS score is 15.  Skin: Skin is warm and dry.  Psychiatric: Mood, memory, affect and judgment normal.    Assessment and Plan :  S/p CABG times 4 All risk factors treated. Post Op Afib Cough Likely due to ACEI. Stop lisinopril for now--RTC 1-2 months--let us know if cough worsens. Adjustment reaction  I have done the exam and reviewed the chart and it is accurate to the best of my knowledge. Development worker, community has been used and  any errors in dictation or transcription are unintentional. Miguel Aschoff M.D. [facility]

## 2017-12-20 NOTE — Patient Instructions (Signed)
Stacey [name] , Thank you for taking time to come for your Medicare Wellness Visit. I appreciate your ongoing commitment to your health goals. Please review the following plan we discussed and let me know if I can assist you in the future.   Screening recommendations/referrals: Colonoscopy: Up to date Mammogram: Currently due, pt to set up apt this fall.  Bone Density: Up to date Recommended yearly ophthalmology/optometry visit for glaucoma screening and checkup Recommended yearly dental visit for hygiene and checkup  Vaccinations: Influenza vaccine: Up to date Pneumococcal vaccine: Up to date Tdap vaccine: Up to date Shingles vaccine: Pt declines today.     Advanced directives: Please bring a copy of your POA (Power of Attorney) and/or Living Will to your next appointment.   Conditions/risks identified: Obesity- recommend cutting back on fatty foods in diet to help aid in weight loss.   Next appointment: 2:40 PM with Dr [name].    Preventive Care 75 Years and Older, Female Preventive care refers to lifestyle choices and visits with your health care provider that can promote health and wellness. What does preventive care include?  A yearly physical exam. This is also called an annual well check.  Dental exams once or twice a year.  Routine eye exams. Ask your health care provider how often you should have your eyes checked.  Personal lifestyle choices, including:  Daily care of your teeth and gums.  Regular physical activity.  Eating a healthy diet.  Avoiding tobacco and drug use.  Limiting alcohol use.  Practicing safe sex.  Taking low-dose aspirin every day.  Taking vitamin and mineral supplements as recommended by your health care provider. What happens during an annual well check? The services and screenings done by your health care provider during your annual well check will depend on your age, overall health, lifestyle risk factors, and family history of  disease. Counseling  Your health care provider may ask you questions about your:  Alcohol use.  Tobacco use.  Drug use.  Emotional well-being.  Home and relationship well-being.  Sexual activity.  Eating habits.  History of falls.  Memory and ability to understand (cognition).  Work and work Statistician.  Reproductive health. Screening  You may have the following tests or measurements:  Height, weight, and BMI.  Blood pressure.  Lipid and cholesterol levels. These may be checked every 5 years, or more frequently if you are over 39 years old.  Skin check.  Lung cancer screening. You may have this screening every year starting at age 22 if you have a 30-pack-year history of smoking and currently smoke or have quit within the past 15 years.  Fecal occult blood test (FOBT) of the stool. You may have this test every year starting at age 53.  Flexible sigmoidoscopy or colonoscopy. You may have a sigmoidoscopy every 5 years or a colonoscopy every 10 years starting at age 87.  Hepatitis C blood test.  Hepatitis B blood test.  Sexually transmitted disease (STD) testing.  Diabetes screening. This is done by checking your blood sugar (glucose) after you have not eaten for a while (fasting). You may have this done every 1-3 years.  Bone density scan. This is done to screen for osteoporosis. You may have this done starting at age 34.  Mammogram. This may be done every 1-2 years. Talk to your health care provider about how often you should have regular mammograms. Talk with your health care provider about your test results, treatment options, and if necessary, the  need for more tests. Vaccines  Your health care provider may recommend certain vaccines, such as:  Influenza vaccine. This is recommended every year.  Tetanus, diphtheria, and acellular pertussis (Tdap, Td) vaccine. You may need a Td booster every 10 years.  Zoster vaccine. You may need this after age  25.  Pneumococcal 13-valent conjugate (PCV13) vaccine. One dose is recommended after age 61.  Pneumococcal polysaccharide (PPSV23) vaccine. One dose is recommended after age 7. Talk to your health care provider about which screenings and vaccines you need and how often you need them. This information is not intended to replace advice given to you by your health care provider. Make sure you discuss any questions you have with your health care provider. Document Released: 06/07/2015 Document Revised: 01/29/2016 Document Reviewed: 03/12/2015 Elsevier Interactive Patient Education  2017 [employer] Prevention in the Home Falls can cause injuries. They can happen to people of all ages. There are many things you can do to make your home safe and to help prevent falls. What can I do on the outside of my home?  Regularly fix the edges of walkways and driveways and fix any cracks.  Remove anything that might make you trip as you walk through a door, such as a raised step or threshold.  Trim any bushes or trees on the path to your home.  Use bright outdoor lighting.  Clear any walking paths of anything that might make someone trip, such as rocks or tools.  Regularly check to see if handrails are loose or broken. Make sure that both sides of any steps have handrails.  Any raised decks and porches should have guardrails on the edges.  Have any leaves, snow, or ice cleared regularly.  Use sand or salt on walking paths during winter.  Clean up any spills in your garage right away. This includes oil or grease spills. What can I do in the bathroom?  Use night lights.  Install grab bars by the toilet and in the tub and shower. Do not use towel bars as grab bars.  Use non-skid mats or decals in the tub or shower.  If you need to sit down in the shower, use a plastic, non-slip stool.  Keep the floor dry. Clean up any water that spills on the floor as soon as it happens.  Remove  soap buildup in the tub or shower regularly.  Attach bath mats securely with double-sided non-slip rug tape.  Do not have throw rugs and other things on the floor that can make you trip. What can I do in the bedroom?  Use night lights.  Make sure that you have a light by your bed that is easy to reach.  Do not use any sheets or blankets that are too big for your bed. They should not hang down onto the floor.  Have a firm chair that has side arms. You can use this for support while you get dressed.  Do not have throw rugs and other things on the floor that can make you trip. What can I do in the kitchen?  Clean up any spills right away.  Avoid walking on wet floors.  Keep items that you use a lot in easy-to-reach places.  If you need to reach something above you, use a strong step stool that has a grab bar.  Keep electrical cords out of the way.  Do not use floor polish or wax that makes floors slippery. If you must use wax,  use non-skid floor wax.  Do not have throw rugs and other things on the floor that can make you trip. What can I do with my stairs?  Do not leave any items on the stairs.  Make sure that there are handrails on both sides of the stairs and use them. Fix handrails that are broken or loose. Make sure that handrails are as [place] as the stairways.  Check any carpeting to make sure that it is firmly attached to the stairs. Fix any carpet that is loose or worn.  Avoid having throw rugs at the top or bottom of the stairs. If you do have throw rugs, attach them to the floor with carpet tape.  Make sure that you have a light switch at the top of the stairs and the bottom of the stairs. If you do not have them, ask someone to add them for you. What else can I do to help prevent falls?  Wear shoes that:  Do not have high heels.  Have rubber bottoms.  Are comfortable and fit you well.  Are closed at the toe. Do not wear sandals.  If you use a  stepladder:  Make sure that it is fully opened. Do not climb a closed stepladder.  Make sure that both sides of the stepladder are locked into place.  Ask someone to hold it for you, if possible.  Clearly mark and make sure that you can see:  Any grab bars or handrails.  First and last steps.  Where the edge of each step is.  Use tools that help you move around (mobility aids) if they are needed. These include:  Canes.  Walkers.  Scooters.  Crutches.  Turn on the lights when you go into a dark area. Replace any light bulbs as soon as they burn out.  Set up your furniture so you have a clear path. Avoid moving your furniture around.  If any of your floors are uneven, fix them.  If there are any pets around you, be aware of where they are.  Review your medicines with your doctor. Some medicines can make you feel dizzy. This can increase your chance of falling. Ask your doctor what other things that you can do to help prevent falls. This information is not intended to replace advice given to you by your health care provider. Make sure you discuss any questions you have with your health care provider. Document Released: 03/07/2009 Document Revised: 10/17/2015 Document Reviewed: 06/15/2014 Elsevier Interactive Patient Education  2017 [employer].

## 2017-12-20 NOTE — Therapy (Signed)
[facility] [address] [place], [address] Phone: [phone]   Fax:  [phone]  Physical Therapy Treatment  Patient Details  Name: Stacey [name] MRN: [id number] Date of Birth: 1941-02-10 Referring Provider: Miguel Aschoff   Encounter Date: 12/20/2017  PT End of Session - 12/20/17 1839    Visit Number  7    Number of Visits  12    Date for PT Re-Evaluation  12/20/17    Authorization Time Period  7/10 progress    PT Start Time  1825    PT Stop Time  1915    PT Time Calculation (min)  50 min    Activity Tolerance  Patient tolerated treatment well    Behavior During Therapy  [facility] for tasks assessed/performed       Past Medical History:  Diagnosis Date  . Abnormal nuclear stress test 05/24/2017  . Arthritis   . Asthma   . Atrial fibrillation, transient ([place])   . 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 Cancer ([place])   . Complication of anesthesia   . Depression   . Family history of adverse reaction to anesthesia    most of family - PONV  . Fibromyalgia   . Glaucoma   . Headache    migraines - 1-2x/mo  . HTN (hypertension) 03/09/2007  . Hyperlipidemia LDL goal <70 03/25/1993  . Hypertension   . Motion sickness    all moving vehicles  . PONV (postoperative nausea and vomiting)   . Thyroid disease     Past Surgical History:  Procedure Laterality Date  . ABDOMINAL HYSTERECTOMY    . APPENDECTOMY    . BLADDER SURGERY    . CATARACT EXTRACTION W/ INTRAOCULAR LENS  IMPLANT, BILATERAL    . COLONOSCOPY WITH PROPOFOL N/A 01/17/2016   Procedure: COLONOSCOPY WITH PROPOFOL;  Surgeon: [name], MD;  Location: [facility] [place];  Service: Endoscopy;  Laterality: N/A;  . CORONARY ARTERY BYPASS GRAFT N/A 02/20/2017   Procedure: CORONARY ARTERY BYPASS GRAFTING (CABG) x , three using left internal mammary artery to left anterior descending coronary artery  and right greater saphenous vein harvested endoscopically to distal right and diagonal coronary arteries.;  Surgeon: Grace Isaac, MD;  Location: [facility];  Service: Open Heart Surgery;  Laterality: N/A;  . LEFT HEART CATH AND CORONARY ANGIOGRAPHY N/A 02/20/2017   Procedure: LEFT HEART CATH AND CORONARY ANGIOGRAPHY;  Surgeon: Isaias Cowman, MD;  Location: [facility];  Service: Cardiovascular;  Laterality: N/A;  . LEFT HEART CATH AND CORS/GRAFTS ANGIOGRAPHY N/A 05/24/2017   Procedure: LEFT HEART CATH AND CORS/GRAFTS ANGIOGRAPHY;  Surgeon: Sherren Mocha, MD;  Location: [facility];  Service: Cardiovascular;  Laterality: N/A;  . NASAL SINUS SURGERY    . POLYPECTOMY  01/17/2016   Procedure: POLYPECTOMY;  Surgeon: [name], MD;  Location: [facility];  Service: Endoscopy;;  . RIGHT OOPHORECTOMY    . TEE WITHOUT CARDIOVERSION N/A 02/20/2017   Procedure: TRANSESOPHAGEAL ECHOCARDIOGRAM (TEE);  Surgeon: Grace Isaac, MD;  Location: [facility];  Service: Open Heart Surgery;  Laterality: N/A;    There were no vitals filed for this visit.  Subjective Assessment - 12/20/17 1830    Subjective  Patient reports she continues with back pain and is improving with left leg weakness and ability to walk. Her left eye continues with decreased vision periperally and inferiorly.  Pertinent History  Patient with recent Occipital CVA 10/22/2017. Admitted to hospital for treatment 10/22/17 - 10/24/2017 and is imrpoving with symtpoms. She is now referred to out patient PT for treatment. long history of back pain with arthritis with episodes of exacerbation for at least the past 10 years. Previous therapy for back pain with good results for pain control. Recently had MI with CABG x 3 02/20/2017 and she is recuperating from this and has been more sedentary.     Limitations  Walking;Sitting;Standing;House hold activities transfer in/out of car; personal care    How long can you sit  comfortably?  < 20 min.    How long can you stand comfortably?  unable to tolerated standing at all in one position    How long can you walk comfortably?  walking is painful and she has to exercise due to recent MI and CVA    Currently in Pain?  Yes    Pain Score  6     Pain Location  Back    Pain Orientation  Mid;Lower;Right;Left;Upper    Pain Descriptors / Indicators  Aching;Dull    Pain Type  Chronic pain    Pain Onset  More than a month ago    Pain Frequency  Constant          Objective: Palpation: spasms with tenderness along bilateral thoracic spine and lumbar paraspinal muscles improved from previous session   Treatment:  Manual therapy: 25 min.goal: pain, spasm  patient prone with pillow under lower legs and head supported in cradle: STM performed to upper and lower spine paraspinal muscles, superficial techniques  Modalities: Electrical stimulation: 15 min: High volt estim.clincial program for muscle spasms  (4) electrodes applied to lower back paraspinal muscles, intensity to tolerance with patient prone lying with pillows for comfort with moist heat to same: goal: pain, spasms   Patient response to treatment:  Patient demonstrated decreased tenderness, spasms by 50% following treatment. Pain level improved from 6/10 to 4/10        PT Education - 12/20/17 1838    Education provided  Yes    Education Details  HEP re assessed    Person(s) Educated  Patient    Methods  Explanation    Comprehension  Verbalized understanding          PT Long Term Goals - 11/08/17 1932      PT LONG TERM GOAL #1   Title  improve FOTO by 10 points demonstrating improved function with daily tasks    Baseline  FOTO    Status  New    Target Date  12/07/17      PT LONG TERM GOAL #2   Title  improve FOTO by 20 points demonstrating improved function with daily tasks    Baseline  --    Status  New    Target Date  01/04/18      PT LONG TERM GOAL #3   Title  patient will be  independent with home program for pain control, exercises in order to transition to self management once discharged from physical therapy    Baseline  limited knowledge of appropriate pain control, exercise progression without guidance, cuing    Status  New    Target Date  01/04/18           Plan - 12/20/17 1840    Clinical Impression Statement  Patient is progressing slowly due to chronic condition and continues with back pain that is responding favorably to current  treatment.     Rehab Potential  Fair    Clinical Impairments Affecting Rehab Potential  (+)motivated, acute on chronic pain, active lifestyle prior to this episode(-)chronic condition, pain, recent CABG x 3 02/20/2017, arthritis, HTN, CVA     PT Frequency  1x / week    PT Duration  12 weeks    PT Treatment/Interventions  Cryotherapy;Electrical Stimulation;Moist Heat;Therapeutic exercise;Therapeutic activities;Neuromuscular re-education;Patient/family education;Manual techniques;Balance training    PT Next Visit Plan  pain control, exercise progression as needed    PT Home Exercise Plan  pain cotnrol, body mechanics, use of lumbar support for sitting, sleeping; basic core strengthening sitting, knee flexion        Patient will benefit from skilled therapeutic intervention in order to improve the following deficits and impairments:  Pain, Improper body mechanics, Increased muscle spasms, Decreased activity tolerance, Decreased endurance, Decreased range of motion, Decreased strength, Impaired perceived functional ability  Visit Diagnosis: Muscle weakness (generalized)  Muscle spasm of back  Difficulty in walking, not elsewhere classified  Low back pain with sciatica, sciatica laterality unspecified, unspecified back pain laterality, unspecified chronicity  Pain in right hip     Problem List Patient Active Problem List   Diagnosis Date Noted  . CVA (cerebral vascular accident) ([place]) 10/22/2017  . Abnormal nuclear  stress test 05/24/2017  . 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 PAF (paroxysmal atrial fibrillation) ([place])   . Non-STEMI (non-ST elevated myocardial infarction) ([place]) 02/20/2017  . 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 Moderate mitral insufficiency 01/11/2017  . Personal history of colonic polyps   . Benign neoplasm of ascending colon   . Benign neoplasm of descending colon   . Disequilibrium 09/16/2015  . Panic attacks 09/11/2015  . Ganglion cyst 08/16/2015  . Hypernatremia 07/04/2015  . Upper back pain 07/04/2015  . Other osteoarthritis involving multiple joints 02/06/2015  . Clinical depression 09/27/2014  . Dry mouth 09/27/2014  . Fibrositis 09/27/2014  . LBP (low back pain) 09/27/2014  . Avitaminosis D 09/27/2014  . Decreased leukocytes 09/27/2014  . Abnormal blood sugar 05/03/2009  . Insomnia 09/29/2007  . HTN (hypertension) 03/09/2007  . Adaptation reaction 01/29/2007  . Acid reflux 10/07/2006  . Menopausal and postmenopausal disorder 10/05/2006  . Headache, migraine 10/05/2006  . Arthritis, degenerative 03/23/1994  . Adult hypothyroidism 04/15/1993  . Hyperlipidemia LDL goal <70 03/25/1993    Jomarie Longs PT 12/21/2017, 7:06 PM  [facility] [address], [place], [address] Phone: [phone]   Fax:  [phone]  Name: Stacey [name] MRN: [id number] Date of Birth: July 29, 1940

## 2017-12-22 ENCOUNTER — Encounter: Payer: Medicare Other | Admitting: Physical Therapy

## 2017-12-24 ENCOUNTER — Telehealth: Payer: Self-pay | Admitting: Family Medicine

## 2017-12-24 DIAGNOSIS — R059 Cough, unspecified: Secondary | ICD-10-CM

## 2017-12-24 DIAGNOSIS — R05 Cough: Secondary | ICD-10-CM

## 2017-12-24 MED ORDER — BENZONATATE 100 MG PO CAPS: 100.0000 mg | ORAL_CAPSULE | Freq: Two times a day (BID) | ORAL | 0 refills | Status: DC | PRN

## 2017-12-24 NOTE — Telephone Encounter (Signed)
Please Review

## 2017-12-24 NOTE — Telephone Encounter (Signed)
Patient advised as directed below.  Thanks,  -Jaquille Kau 

## 2017-12-24 NOTE — Telephone Encounter (Signed)
Pt's husband [name] stated that pt was recently seen for OV with Dr. [name] for cough. Fred stated cough hasn't improved and is requesting that an Rx for cough medication that won't interact with her medications be sent to [facility] request that since Dr. [name] isn't in the office today that another provider send in an Rx. OV was offered and [name] stated that he can't get pt in for OV today. Please advise. Thanks TNP

## 2017-12-24 NOTE — Telephone Encounter (Signed)
Sent in tessalon perles for cough

## 2017-12-27 ENCOUNTER — Ambulatory Visit: Payer: Medicare Other | Admitting: Physical Therapy

## 2017-12-29 ENCOUNTER — Encounter: Payer: Medicare Other | Admitting: Physical Therapy

## 2017-12-31 DIAGNOSIS — H53469 Homonymous bilateral field defects, unspecified side: Secondary | ICD-10-CM | POA: Diagnosis not present

## 2018-01-03 ENCOUNTER — Ambulatory Visit: Payer: Medicare Other | Admitting: Physical Therapy

## 2018-02-03 DIAGNOSIS — H26499 Other secondary cataract, unspecified eye: Secondary | ICD-10-CM | POA: Diagnosis not present

## 2018-02-04 ENCOUNTER — Other Ambulatory Visit: Payer: Self-pay | Admitting: Family Medicine

## 2018-02-04 DIAGNOSIS — F5101 Primary insomnia: Secondary | ICD-10-CM

## 2018-02-14 ENCOUNTER — Ambulatory Visit (INDEPENDENT_AMBULATORY_CARE_PROVIDER_SITE_OTHER): Payer: Medicare Other | Admitting: Family Medicine

## 2018-02-14 VITALS — BP 135/87 | HR 82 | Temp 98.5°F | Resp 16 | Wt 168.0 lb

## 2018-02-14 DIAGNOSIS — F4323 Adjustment disorder with mixed anxiety and depressed mood: Secondary | ICD-10-CM

## 2018-02-14 DIAGNOSIS — I639 Cerebral infarction, unspecified: Secondary | ICD-10-CM

## 2018-02-14 DIAGNOSIS — F5101 Primary insomnia: Secondary | ICD-10-CM

## 2018-02-14 DIAGNOSIS — R079 Chest pain, unspecified: Secondary | ICD-10-CM | POA: Diagnosis not present

## 2018-02-14 DIAGNOSIS — K219 Gastro-esophageal reflux disease without esophagitis: Secondary | ICD-10-CM | POA: Diagnosis not present

## 2018-02-14 DIAGNOSIS — I480 Paroxysmal atrial fibrillation: Secondary | ICD-10-CM

## 2018-02-14 DIAGNOSIS — Z23 Encounter for immunization: Secondary | ICD-10-CM | POA: Diagnosis not present

## 2018-02-14 DIAGNOSIS — M158 Other polyosteoarthritis: Secondary | ICD-10-CM

## 2018-02-14 MED ORDER — ZOLPIDEM TARTRATE 5 MG PO TABS: 5.0000 mg | ORAL_TABLET | Freq: Every evening | ORAL | 3 refills | Status: DC | PRN

## 2018-02-14 MED ORDER — TRAMADOL HCL 50 MG PO TABS: 50.0000 mg | ORAL_TABLET | Freq: Four times a day (QID) | ORAL | 3 refills | Status: DC | PRN

## 2018-02-14 NOTE — Progress Notes (Signed)
Stacey [name]  MRN: [id number] DOB: [birth year]  Subjective:  HPI   The patient is a 77 year old female who presents for follow up after stopping her ACE1 secondary to cough.  She was last seen on 12/20/17 and at that time she was instructed to discontinue her Lisinopril and return today or sooner if the cough worsened.   She reports that she had not stopped the Lisinopril due to misunderstanding of instructions.  However, she reports that she is doing well and has no cough. She says she has some "spells" where she feels weak in her chest and her heart races slightly. These are noticed more with increased stress.  Patient Active Problem List   Diagnosis Date Noted  . CVA (cerebral vascular accident) ([place]) 10/22/2017  . Abnormal nuclear stress test 05/24/2017  . 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 PAF (paroxysmal atrial fibrillation) ([place])   . Non-STEMI (non-ST elevated myocardial infarction) ([place]) 02/20/2017  . 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 Moderate mitral insufficiency 01/11/2017  . Personal history of colonic polyps   . Benign neoplasm of ascending colon   . Benign neoplasm of descending colon   . Disequilibrium 09/16/2015  . Panic attacks 09/11/2015  . Ganglion cyst 08/16/2015  . Hypernatremia 07/04/2015  . Upper back pain 07/04/2015  . Other osteoarthritis involving multiple joints 02/06/2015  . Clinical depression 09/27/2014  . Dry mouth 09/27/2014  . Fibrositis 09/27/2014  . LBP (low back pain) 09/27/2014  . Avitaminosis D 09/27/2014  . Decreased leukocytes 09/27/2014  . Abnormal blood sugar 05/03/2009  . Insomnia 09/29/2007  . HTN (hypertension) 03/09/2007  . Adaptation reaction 01/29/2007  . Acid reflux 10/07/2006  . Menopausal and postmenopausal disorder 10/05/2006  . Headache, migraine 10/05/2006  . Arthritis, degenerative 03/23/1994  . Adult hypothyroidism 04/15/1993  . Hyperlipidemia LDL goal <70 03/25/1993     Past Medical History:  Diagnosis Date  . Abnormal nuclear stress test 05/24/2017  . Arthritis   . Asthma   . Atrial fibrillation, transient ([place])   . 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 Cancer ([place])   . Complication of anesthesia   . Depression   . Family history of adverse reaction to anesthesia    most of family - PONV  . Fibromyalgia   . Glaucoma   . Headache    migraines - 1-2x/mo  . HTN (hypertension) 03/09/2007  . Hyperlipidemia LDL goal <70 03/25/1993  . Hypertension   . Motion sickness    all moving vehicles  . PONV (postoperative nausea and vomiting)   . Thyroid disease     Social History   Socioeconomic History  . Marital status: Married    Spouse name: [name]  . Number of children: 3  . Years of education: Not on file  . Highest education level: GED or equivalent  Occupational History    Employer: [employer]  . Financial resource strain: Not hard at all  . Food insecurity:    Worry: Never true    Inability: Never true  . Transportation needs:    Medical: No    Non-medical: No  Tobacco Use  . Smoking status: Former Smoker    Last attempt to quit: 05/26/1975    Years since quitting: 42.7  . Smokeless tobacco: Never Used  . Tobacco comment: quit 45  years ago   Substance and Sexual Activity  .  Alcohol use: No  . Drug use: No  . Sexual activity: Never  Lifestyle  . Physical activity:    Days per week: Not on file    Minutes per session: Not on file  . Stress: Only a little  Relationships  . Social connections:    Talks on phone: Not on file    Gets together: Not on file    Attends religious service: Not on file    Active member of club or organization: Not on file    Attends meetings of clubs or organizations: Not on file    Relationship status: Not on file  . Intimate partner violence:    Fear of current or ex partner: Not on file    Emotionally abused: Not on file     Physically abused: Not on file    Forced sexual activity: Not on file  Other Topics Concern  . Not on file  Social History Narrative  . Not on file    Outpatient Encounter Medications as of 02/14/2018  Medication Sig  . acetaminophen (TYLENOL) 325 MG tablet Take 2 tablets (650 mg total) by mouth every 6 (six) hours as needed for mild pain.  Marland Kitchen albuterol (VENTOLIN HFA) 108 (90 Base) MCG/ACT inhaler INHALE 2 PUFFS BY MOUTH EVERY 4 TO 6 HOURS AS NEEDED FOR SHORTNESS OF BREATH  . ALPRAZolam (XANAX) 0.25 MG tablet Take 1/2-1 tablet every 8 hours as needed for anxiety.  Marland Kitchen amLODipine (NORVASC) 5 MG tablet Take 1 tablet (5 mg total) daily by mouth.  Marland Kitchen apixaban (ELIQUIS) 5 MG TABS tablet Take 1 tablet (5 mg total) by mouth 2 (two) times daily.  Marland Kitchen aspirin 81 MG tablet Take 1 tablet (81 mg total) by mouth daily.  Marland Kitchen buPROPion (WELLBUTRIN XL) 300 MG 24 hr tablet Take 1 tablet (300 mg total) by mouth daily.  . capsaicin (ZOSTRIX) 0.025 % cream Apply 1 application topically as needed (nerve pain in chest).  . Cholecalciferol (VITAMIN D) 2000 units tablet Take 2,000 Units by mouth daily.  . Cyanocobalamin (B-12) 500 MCG TABS Take by mouth daily.   . diazepam (VALIUM) 5 MG tablet Take 1 tablet (5 mg total) by mouth every 12 (twelve) hours as needed for anxiety. (Patient taking differently: Take 5 mg by mouth daily as needed for anxiety. )  . EPINEPHrine (EPIPEN 2-PAK) 0.3 mg/0.3 mL IJ SOAJ injection INJECT AS DIRECTED FOR SEVERE ALLERGIC REACTION  . hydrochlorothiazide (HYDRODIURIL) 12.5 MG tablet Take 12.5 mg by mouth daily.   Marland Kitchen levothyroxine (SYNTHROID, LEVOTHROID) 100 MCG tablet Take 1 tablet (100 mcg total) by mouth daily.  Marland Kitchen lisinopril (PRINIVIL,ZESTRIL) 10 MG tablet Take 1 tablet (10 mg total) by mouth daily.  . metoprolol tartrate (LOPRESSOR) 25 MG tablet Take 1 tablet (25 mg total) by mouth 2 (two) times daily.  . metroNIDAZOLE (METROGEL) 0.75 % gel APPLY ON THE SKIN AS DIRECTED 1-2 TIMES A DAY TO  FACE  . Multiple Vitamin (MULTI-VITAMINS) TABS Take 1 tablet by mouth daily.  . rosuvastatin (CRESTOR) 40 MG tablet Take 0.5 tablets (20 mg total) by mouth daily.  . tacrolimus (PROTOPIC) 0.1 % ointment APPLY TO AFFECTED AREA TWICE A DAY  . zolpidem (AMBIEN) 5 MG tablet TAKE 1 TABLET BY MOUTH AT BEDTIME AS NEEDED FOR SLEEP  . oxymetazoline (AFRIN) 0.05 % nasal spray Place 1 spray into both nostrils as needed for congestion.  . traMADol (ULTRAM) 50 MG tablet Take 1 tablet (50 mg total) by mouth every 6 (six) hours as  needed for moderate pain. (Patient not taking: Reported on 02/14/2018)  . [DISCONTINUED] benzonatate (TESSALON) 100 MG capsule Take 1 capsule (100 mg total) by mouth 2 (two) times daily as needed for cough.   No facility-administered encounter medications on file as of 02/14/2018.     Allergies  Allergen Reactions  . Cephalexin Anaphylaxis  . Atorvastatin   . Morphine And Related Hives  . Procaine Other (See Comments)    Chest pain Chest pain  . Shellfish Allergy Swelling    angioedema  . Statins Other (See Comments)    Muscle pain  . Tomato Other (See Comments)    (by testing) - also beans, wheat, peas (by testing) - also beans, wheat, peas  . Penicillins Hives and Rash    Review of Systems  Constitutional: Negative for fever and malaise/fatigue.  Eyes: Negative.   Respiratory: Positive for shortness of breath. Negative for cough.   Cardiovascular: Positive for chest pain (chronic) and leg swelling (right only). Negative for palpitations.  Gastrointestinal: Negative.   Skin: Negative.   Neurological: Negative.   Endo/Heme/Allergies: Negative.   Psychiatric/Behavioral: The patient is nervous/anxious (patient has diffiuclty being in crowds.).     Objective:  BP 135/87 (BP Location: Right Arm, Patient Position: Sitting, Cuff Size: Normal)   Pulse 82   Temp 98.5 F (36.9 C) (Oral)   Resp 16   Wt 168 lb (76.2 kg)   BMI 30.73 kg/m   Physical Exam    Constitutional: She is oriented to person, place, and time and well-developed, well-nourished, and in no distress.  HENT:  Head: Normocephalic and atraumatic.  Right Ear: External ear normal.  Left Ear: External ear normal.  Nose: Nose normal.  Eyes: Conjunctivae are normal. No scleral icterus.  Neck: No thyromegaly present.  Cardiovascular: Normal rate, regular rhythm and normal heart sounds.  Pulmonary/Chest: Effort normal and breath sounds normal.  Abdominal: Soft.  Musculoskeletal: She exhibits no edema.  Neurological: She is alert and oriented to person, place, and time. Gait normal. GCS score is 15.  Skin: Skin is warm and dry.  Psychiatric: Mood, memory, affect and judgment normal.    Assessment and Plan :  1. Need for influenza vaccination  - Flu vaccine HIGH DOSE PF (Fluzone High dose)  2. Chest pain, unspecified type S/p CABG. - EKG 12-Lead  3. Primary insomnia  - zolpidem (AMBIEN) 5 MG tablet; Take 1 tablet (5 mg total) by mouth at bedtime as needed. for sleep  Dispense: 30 tablet; Refill: 3 1. Need for influenza vaccination  - Flu vaccine HIGH DOSE PF (Fluzone High dose)  2. Chest pain, unspecified type  - EKG 12-Lead  3. Primary insomnia  - zolpidem (AMBIEN) 5 MG tablet; Take 1 tablet (5 mg total) by mouth at bedtime as needed. for sleep  Dispense: 30 tablet; Refill: 3  4. PAF (paroxysmal atrial fibrillation) ([facility]) Ongoing Afib which is rate controlled and she is on Eliquis.  5. Gastroesophageal reflux disease without esophagitis   6. Adjustment disorder with mixed anxiety and depressed mood Dealing with multiple family issues. 7.OA Rx Tramadol as NSAID not an option.

## 2018-03-09 DIAGNOSIS — I10 Essential (primary) hypertension: Secondary | ICD-10-CM | POA: Diagnosis not present

## 2018-03-09 DIAGNOSIS — I25810 Atherosclerosis of coronary artery bypass graft(s) without angina pectoris: Secondary | ICD-10-CM | POA: Diagnosis not present

## 2018-03-09 DIAGNOSIS — I4819 Other persistent atrial fibrillation: Secondary | ICD-10-CM | POA: Diagnosis not present

## 2018-03-09 DIAGNOSIS — R0602 Shortness of breath: Secondary | ICD-10-CM | POA: Diagnosis not present

## 2018-03-16 ENCOUNTER — Other Ambulatory Visit: Payer: Self-pay | Admitting: Family Medicine

## 2018-03-16 DIAGNOSIS — F3342 Major depressive disorder, recurrent, in full remission: Secondary | ICD-10-CM

## 2018-03-21 ENCOUNTER — Observation Stay
Admission: EM | Admit: 2018-03-21 | Discharge: 2018-03-22 | Disposition: A | Discharge status: Home / Self Care | Payer: Medicare Other | Attending: Internal Medicine | Admitting: Internal Medicine

## 2018-03-21 ENCOUNTER — Encounter: Payer: Self-pay | Admitting: Emergency Medicine

## 2018-03-21 ENCOUNTER — Observation Stay: Payer: Medicare Other

## 2018-03-21 ENCOUNTER — Emergency Department: Payer: Medicare Other

## 2018-03-21 ENCOUNTER — Other Ambulatory Visit: Payer: Self-pay

## 2018-03-21 DIAGNOSIS — Z91013 Allergy to seafood: Secondary | ICD-10-CM | POA: Insufficient documentation

## 2018-03-21 DIAGNOSIS — I6523 Occlusion and stenosis of bilateral carotid arteries: Secondary | ICD-10-CM | POA: Insufficient documentation

## 2018-03-21 DIAGNOSIS — R29810 Facial weakness: Secondary | ICD-10-CM | POA: Insufficient documentation

## 2018-03-21 DIAGNOSIS — Z7901 Long term (current) use of anticoagulants: Secondary | ICD-10-CM | POA: Insufficient documentation

## 2018-03-21 DIAGNOSIS — Z7982 Long term (current) use of aspirin: Secondary | ICD-10-CM | POA: Diagnosis not present

## 2018-03-21 DIAGNOSIS — D122 Benign neoplasm of ascending colon: Secondary | ICD-10-CM | POA: Diagnosis not present

## 2018-03-21 DIAGNOSIS — S0990XA Unspecified injury of head, initial encounter: Secondary | ICD-10-CM | POA: Diagnosis not present

## 2018-03-21 DIAGNOSIS — M797 Fibromyalgia: Secondary | ICD-10-CM | POA: Insufficient documentation

## 2018-03-21 DIAGNOSIS — K1370 Unspecified lesions of oral mucosa: Secondary | ICD-10-CM | POA: Insufficient documentation

## 2018-03-21 DIAGNOSIS — I6502 Occlusion and stenosis of left vertebral artery: Secondary | ICD-10-CM | POA: Insufficient documentation

## 2018-03-21 DIAGNOSIS — F329 Major depressive disorder, single episode, unspecified: Secondary | ICD-10-CM | POA: Insufficient documentation

## 2018-03-21 DIAGNOSIS — Z87891 Personal history of nicotine dependence: Secondary | ICD-10-CM | POA: Insufficient documentation

## 2018-03-21 DIAGNOSIS — R682 Dry mouth, unspecified: Secondary | ICD-10-CM | POA: Insufficient documentation

## 2018-03-21 DIAGNOSIS — Z8679 Personal history of other diseases of the circulatory system: Secondary | ICD-10-CM | POA: Diagnosis not present

## 2018-03-21 DIAGNOSIS — E119 Type 2 diabetes mellitus without complications: Secondary | ICD-10-CM | POA: Diagnosis not present

## 2018-03-21 DIAGNOSIS — I639 Cerebral infarction, unspecified: Secondary | ICD-10-CM | POA: Diagnosis not present

## 2018-03-21 DIAGNOSIS — F410 Panic disorder [episodic paroxysmal anxiety] without agoraphobia: Secondary | ICD-10-CM | POA: Insufficient documentation

## 2018-03-21 DIAGNOSIS — G459 Transient cerebral ischemic attack, unspecified: Secondary | ICD-10-CM

## 2018-03-21 DIAGNOSIS — Z859 Personal history of malignant neoplasm, unspecified: Secondary | ICD-10-CM | POA: Diagnosis not present

## 2018-03-21 DIAGNOSIS — Z79899 Other long term (current) drug therapy: Secondary | ICD-10-CM | POA: Diagnosis not present

## 2018-03-21 DIAGNOSIS — R200 Anesthesia of skin: Secondary | ICD-10-CM | POA: Insufficient documentation

## 2018-03-21 DIAGNOSIS — Z880 Allergy status to penicillin: Secondary | ICD-10-CM | POA: Insufficient documentation

## 2018-03-21 DIAGNOSIS — E785 Hyperlipidemia, unspecified: Secondary | ICD-10-CM | POA: Diagnosis not present

## 2018-03-21 DIAGNOSIS — Z950 Presence of cardiac pacemaker: Secondary | ICD-10-CM | POA: Insufficient documentation

## 2018-03-21 DIAGNOSIS — G9389 Other specified disorders of brain: Secondary | ICD-10-CM | POA: Diagnosis not present

## 2018-03-21 DIAGNOSIS — Z9842 Cataract extraction status, left eye: Secondary | ICD-10-CM | POA: Insufficient documentation

## 2018-03-21 DIAGNOSIS — M545 Low back pain: Secondary | ICD-10-CM | POA: Insufficient documentation

## 2018-03-21 DIAGNOSIS — K219 Gastro-esophageal reflux disease without esophagitis: Secondary | ICD-10-CM | POA: Insufficient documentation

## 2018-03-21 DIAGNOSIS — Z885 Allergy status to narcotic agent status: Secondary | ICD-10-CM | POA: Insufficient documentation

## 2018-03-21 DIAGNOSIS — I081 Rheumatic disorders of both mitral and tricuspid valves: Secondary | ICD-10-CM | POA: Insufficient documentation

## 2018-03-21 DIAGNOSIS — K1379 Other lesions of oral mucosa: Secondary | ICD-10-CM | POA: Diagnosis not present

## 2018-03-21 DIAGNOSIS — Z90710 Acquired absence of both cervix and uterus: Secondary | ICD-10-CM | POA: Insufficient documentation

## 2018-03-21 DIAGNOSIS — Z881 Allergy status to other antibiotic agents status: Secondary | ICD-10-CM | POA: Insufficient documentation

## 2018-03-21 DIAGNOSIS — Z91018 Allergy to other foods: Secondary | ICD-10-CM | POA: Insufficient documentation

## 2018-03-21 DIAGNOSIS — I2510 Atherosclerotic heart disease of native coronary artery without angina pectoris: Secondary | ICD-10-CM | POA: Diagnosis not present

## 2018-03-21 DIAGNOSIS — R531 Weakness: Secondary | ICD-10-CM | POA: Diagnosis not present

## 2018-03-21 DIAGNOSIS — I10 Essential (primary) hypertension: Secondary | ICD-10-CM | POA: Insufficient documentation

## 2018-03-21 DIAGNOSIS — I4891 Unspecified atrial fibrillation: Secondary | ICD-10-CM | POA: Diagnosis not present

## 2018-03-21 DIAGNOSIS — I252 Old myocardial infarction: Secondary | ICD-10-CM | POA: Insufficient documentation

## 2018-03-21 DIAGNOSIS — H409 Unspecified glaucoma: Secondary | ICD-10-CM | POA: Insufficient documentation

## 2018-03-21 DIAGNOSIS — I480 Paroxysmal atrial fibrillation: Secondary | ICD-10-CM | POA: Diagnosis not present

## 2018-03-21 DIAGNOSIS — Z888 Allergy status to other drugs, medicaments and biological substances status: Secondary | ICD-10-CM | POA: Insufficient documentation

## 2018-03-21 DIAGNOSIS — I482 Chronic atrial fibrillation, unspecified: Secondary | ICD-10-CM | POA: Diagnosis not present

## 2018-03-21 DIAGNOSIS — J45909 Unspecified asthma, uncomplicated: Secondary | ICD-10-CM | POA: Diagnosis not present

## 2018-03-21 DIAGNOSIS — R51 Headache: Secondary | ICD-10-CM | POA: Insufficient documentation

## 2018-03-21 DIAGNOSIS — G4700 Insomnia, unspecified: Secondary | ICD-10-CM | POA: Insufficient documentation

## 2018-03-21 DIAGNOSIS — E559 Vitamin D deficiency, unspecified: Secondary | ICD-10-CM | POA: Insufficient documentation

## 2018-03-21 DIAGNOSIS — M1990 Unspecified osteoarthritis, unspecified site: Secondary | ICD-10-CM | POA: Diagnosis not present

## 2018-03-21 DIAGNOSIS — Z884 Allergy status to anesthetic agent status: Secondary | ICD-10-CM | POA: Insufficient documentation

## 2018-03-21 DIAGNOSIS — Z951 Presence of aortocoronary bypass graft: Secondary | ICD-10-CM | POA: Insufficient documentation

## 2018-03-21 DIAGNOSIS — Z9841 Cataract extraction status, right eye: Secondary | ICD-10-CM | POA: Insufficient documentation

## 2018-03-21 DIAGNOSIS — E039 Hypothyroidism, unspecified: Secondary | ICD-10-CM | POA: Insufficient documentation

## 2018-03-21 DIAGNOSIS — Z86010 Personal history of colonic polyps: Secondary | ICD-10-CM | POA: Insufficient documentation

## 2018-03-21 LAB — COMPREHENSIVE METABOLIC PANEL
ALBUMIN: 4.2 g/dL (ref 3.5–5.0)
ALK PHOS: 73 U/L (ref 38–126)
ALT: 17 U/L (ref 0–44)
AST: 26 U/L (ref 15–41)
Anion gap: 8 (ref 5–15)
BILIRUBIN TOTAL: 0.7 mg/dL (ref 0.3–1.2)
BUN: 19 mg/dL (ref 8–23)
CALCIUM: 9.1 mg/dL (ref 8.9–10.3)
CO2: 28 mmol/L (ref 22–32)
Chloride: 97 mmol/L — ABNORMAL LOW (ref 98–111)
Creatinine, Ser: 1.01 mg/dL — ABNORMAL HIGH (ref 0.44–1.00)
GFR calc Af Amer: >60 mL/min (ref >60)
GFR calc non Af Amer: 53 mL/min — ABNORMAL LOW (ref >60)
GLUCOSE: 114 mg/dL — AB (ref 70–99)
Potassium: 4 mmol/L (ref 3.5–5.1)
SODIUM: 133 mmol/L — AB (ref 135–145)
TOTAL PROTEIN: 7 g/dL (ref 6.5–8.1)

## 2018-03-21 LAB — CBC
HEMATOCRIT: 41.8 % (ref 36.0–46.0)
Hemoglobin: 13.8 g/dL (ref 12.0–15.0)
MCH: 28.6 pg (ref 26.0–34.0)
MCHC: 33 g/dL (ref 30.0–36.0)
MCV: 86.5 fL (ref 80.0–100.0)
NRBC: 0 % (ref 0.0–0.2)
PLATELETS: 140 10*3/uL — AB (ref 150–400)
RBC: 4.83 MIL/uL (ref 3.87–5.11)
RDW: 12.8 % (ref 11.5–15.5)
WBC: 4.5 10*3/uL (ref 4.0–10.5)

## 2018-03-21 LAB — DIFFERENTIAL
Abs Immature Granulocytes: 0.01 10*3/uL (ref 0.00–0.07)
Basophils Absolute: 0 10*3/uL (ref 0.0–0.1)
Basophils Relative: 1 %
EOS PCT: 4 %
Eosinophils Absolute: 0.2 10*3/uL (ref 0.0–0.5)
IMMATURE GRANULOCYTES: 0 %
LYMPHS ABS: 1.1 10*3/uL (ref 0.7–4.0)
LYMPHS PCT: 24 %
MONO ABS: 0.3 10*3/uL (ref 0.1–1.0)
Monocytes Relative: 6 %
NEUTROS ABS: 2.9 10*3/uL (ref 1.7–7.7)
Neutrophils Relative %: 65 %

## 2018-03-21 LAB — TROPONIN I: Troponin I: <0.03 ng/mL (ref <0.03)

## 2018-03-21 LAB — PROTIME-INR
INR: 1.12
PROTHROMBIN TIME: 14.3 s (ref 11.4–15.2)

## 2018-03-21 LAB — APTT: aPTT: 38 seconds — ABNORMAL HIGH (ref 24–36)

## 2018-03-21 LAB — GLUCOSE, CAPILLARY: Glucose-Capillary: 115 mg/dL — ABNORMAL HIGH (ref 70–99)

## 2018-03-21 MED ORDER — LORAZEPAM 2 MG/ML IJ SOLN
1.0000 mg | Freq: Once | INTRAMUSCULAR | Status: AC
  Administered 2018-03-21: 1 mg via INTRAVENOUS

## 2018-03-21 MED ORDER — APIXABAN 5 MG PO TABS
5.0000 mg | ORAL_TABLET | Freq: Two times a day (BID) | ORAL | Status: DC
  Administered 2018-03-22: 5 mg via ORAL
  Filled 2018-03-21 (×2): qty 1

## 2018-03-21 MED ORDER — LIDOCAINE VISCOUS HCL 2 % MT SOLN
15.0000 mL | OROMUCOSAL | Status: DC | PRN
  Administered 2018-03-21: 15 mL via OROMUCOSAL
  Filled 2018-03-21 (×3): qty 15

## 2018-03-21 MED ORDER — LISINOPRIL 20 MG PO TABS
40.0000 mg | ORAL_TABLET | Freq: Every day | ORAL | Status: DC
  Administered 2018-03-22: 10:00:00 40 mg via ORAL
  Filled 2018-03-21: qty 2

## 2018-03-21 MED ORDER — ROSUVASTATIN CALCIUM 20 MG PO TABS
20.0000 mg | ORAL_TABLET | Freq: Every day | ORAL | Status: DC
  Filled 2018-03-21: qty 1

## 2018-03-21 MED ORDER — ZOLPIDEM TARTRATE 5 MG PO TABS
5.0000 mg | ORAL_TABLET | Freq: Every evening | ORAL | Status: DC | PRN
  Administered 2018-03-21: 5 mg via ORAL
  Filled 2018-03-21: qty 1

## 2018-03-21 MED ORDER — ASPIRIN EC 81 MG PO TBEC
81.0000 mg | DELAYED_RELEASE_TABLET | Freq: Every day | ORAL | Status: DC
  Administered 2018-03-22: 10:00:00 81 mg via ORAL
  Filled 2018-03-21: qty 1

## 2018-03-21 MED ORDER — ACETAMINOPHEN 650 MG RE SUPP: 650.0000 mg | RECTAL | Status: DC | PRN

## 2018-03-21 MED ORDER — STROKE: EARLY STAGES OF RECOVERY BOOK
Freq: Once | Status: AC
  Administered 2018-03-21: 19:00:00

## 2018-03-21 MED ORDER — ADULT MULTIVITAMIN W/MINERALS CH
1.0000 | ORAL_TABLET | Freq: Every day | ORAL | Status: DC
  Administered 2018-03-22: 1 via ORAL
  Filled 2018-03-21: qty 1

## 2018-03-21 MED ORDER — ACETAMINOPHEN 160 MG/5ML PO SOLN
650.0000 mg | ORAL | Status: DC | PRN
  Filled 2018-03-21: qty 20.3

## 2018-03-21 MED ORDER — ENOXAPARIN SODIUM 40 MG/0.4ML ~~LOC~~ SOLN: 40.0000 mg | SUBCUTANEOUS | Status: DC

## 2018-03-21 MED ORDER — AMLODIPINE BESYLATE 5 MG PO TABS
5.0000 mg | ORAL_TABLET | Freq: Every day | ORAL | Status: DC
  Administered 2018-03-22: 5 mg via ORAL
  Filled 2018-03-21: qty 1

## 2018-03-21 MED ORDER — HYDROCHLOROTHIAZIDE 25 MG PO TABS
12.5000 mg | ORAL_TABLET | Freq: Every day | ORAL | Status: DC
  Administered 2018-03-22: 10:00:00 12.5 mg via ORAL
  Filled 2018-03-21: qty 1

## 2018-03-21 MED ORDER — IOHEXOL 350 MG/ML SOLN
75.0000 mL | Freq: Once | INTRAVENOUS | Status: AC | PRN
  Administered 2018-03-21: 75 mL via INTRAVENOUS

## 2018-03-21 MED ORDER — ACETAMINOPHEN 325 MG PO TABS: 650.0000 mg | ORAL_TABLET | ORAL | Status: DC | PRN

## 2018-03-21 MED ORDER — CYANOCOBALAMIN 500 MCG PO TABS
500.0000 ug | ORAL_TABLET | Freq: Every day | ORAL | Status: DC
  Administered 2018-03-22: 10:00:00 500 ug via ORAL
  Filled 2018-03-21: qty 1

## 2018-03-21 MED ORDER — ACETAMINOPHEN 325 MG PO TABS: 650.0000 mg | ORAL_TABLET | Freq: Four times a day (QID) | ORAL | Status: DC | PRN

## 2018-03-21 MED ORDER — METOPROLOL TARTRATE 25 MG PO TABS
25.0000 mg | ORAL_TABLET | Freq: Two times a day (BID) | ORAL | Status: DC
  Administered 2018-03-22: 10:00:00 25 mg via ORAL
  Filled 2018-03-21 (×2): qty 1

## 2018-03-21 MED ORDER — LIDOCAINE VISCOUS HCL 2 % MT SOLN: 15.0000 mL | Freq: Four times a day (QID) | OROMUCOSAL | Status: DC | PRN

## 2018-03-21 MED ORDER — LORAZEPAM 2 MG/ML IJ SOLN
INTRAMUSCULAR | Status: AC
  Administered 2018-03-21: 1 mg
  Filled 2018-03-21: qty 1

## 2018-03-21 MED ORDER — BUPROPION HCL ER (XL) 300 MG PO TB24
300.0000 mg | ORAL_TABLET | Freq: Every day | ORAL | Status: DC
  Administered 2018-03-22: 300 mg via ORAL
  Filled 2018-03-21: qty 1

## 2018-03-21 MED ORDER — DIAZEPAM 5 MG PO TABS
5.0000 mg | ORAL_TABLET | Freq: Every day | ORAL | Status: DC | PRN
  Filled 2018-03-21: qty 1

## 2018-03-21 MED ORDER — LEVOTHYROXINE SODIUM 100 MCG PO TABS
100.0000 ug | ORAL_TABLET | Freq: Every day | ORAL | Status: DC
  Administered 2018-03-22: 06:00:00 100 ug via ORAL
  Filled 2018-03-21: qty 1

## 2018-03-21 MED ORDER — VITAMIN D 1000 UNITS PO TABS
2000.0000 [IU] | ORAL_TABLET | Freq: Every day | ORAL | Status: DC
  Administered 2018-03-22: 10:00:00 2000 [IU] via ORAL
  Filled 2018-03-21: qty 2

## 2018-03-21 NOTE — Progress Notes (Signed)
CODE STROKE- PHARMACY COMMUNICATION   Time CODE STROKE called/page received: 1338  Time response to CODE STROKE was made (in person or via phone): 1410  Time Stroke Kit retrieved from [facility] (only if needed): N/A  Name of Provider/Nurse contacted: Jana Half  Past Medical History:  Diagnosis Date  . Abnormal nuclear stress test 05/24/2017  . Arthritis   . Asthma   . Atrial fibrillation, transient ([place])   . 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 Cancer ([place])   . Complication of anesthesia   . Depression   . Family history of adverse reaction to anesthesia    most of family - PONV  . Fibromyalgia   . Glaucoma   . Headache    migraines - 1-2x/mo  . HTN (hypertension) 03/09/2007  . Hyperlipidemia LDL goal <70 03/25/1993  . Hypertension   . Motion sickness    all moving vehicles  . PONV (postoperative nausea and vomiting)   . Thyroid disease    Prior to Admission medications   Medication Sig Start Date End Date Taking? Authorizing Provider  acetaminophen (TYLENOL) 325 MG tablet Take 2 tablets (650 mg total) by mouth every 6 (six) hours as needed for mild pain. 02/28/17  Yes Lars Pinks M, PA-C  albuterol (VENTOLIN HFA) 108 (90 Base) MCG/ACT inhaler INHALE 2 PUFFS BY MOUTH EVERY 4 TO 6 HOURS AS NEEDED FOR SHORTNESS OF BREATH Patient taking differently: Inhale 2 puffs into the lungs every 4 (four) hours as needed for wheezing or shortness of breath.  12/17/16  Yes Mar Daring, PA-C  amLODipine (NORVASC) 5 MG tablet Take 1 tablet (5 mg total) daily by mouth. 04/04/17  Yes [name], MD  apixaban (ELIQUIS) 5 MG TABS tablet Take 1 tablet (5 mg total) by mouth 2 (two) times daily. 10/24/17  Yes Gladstone Lighter, MD  aspirin 81 MG tablet Take 1 tablet (81 mg total) by mouth daily. 10/24/17  Yes Gladstone Lighter, MD  buPROPion (WELLBUTRIN XL) 300 MG 24 hr tablet TAKE 1 TABLET BY MOUTH EVERY DAY Patient taking  differently: Take 300 mg by mouth daily.  03/16/18  Yes [name], MD  capsaicin (ZOSTRIX) 0.025 % cream Apply 1 application topically as needed (nerve pain in chest).   Yes [provider]  Cholecalciferol (VITAMIN D) 2000 units tablet Take 2,000 Units by mouth daily.   Yes [provider]  Cyanocobalamin (B-12) 500 MCG TABS Take 500 mcg by mouth daily.    Yes [provider]  diazepam (VALIUM) 5 MG tablet Take 1 tablet (5 mg total) by mouth every 12 (twelve) hours as needed for anxiety. Patient taking differently: Take 5 mg by mouth daily as needed for anxiety.  08/30/17  Yes [name], MD  EPINEPHrine (EPIPEN 2-PAK) 0.3 mg/0.3 mL IJ SOAJ injection INJECT AS DIRECTED FOR SEVERE ALLERGIC REACTION Patient taking differently: Inject 0.3 mg into the muscle as needed (for severe allergic reaction).  12/17/16  Yes Mar Daring, PA-C  hydrochlorothiazide (HYDRODIURIL) 12.5 MG tablet Take 12.5 mg by mouth daily.  11/01/17 11/01/18 Yes [provider]  levothyroxine (SYNTHROID, LEVOTHROID) 100 MCG tablet Take 1 tablet (100 mcg total) by mouth daily. 04/30/17  Yes [name], MD  lisinopril (PRINIVIL,ZESTRIL) 40 MG tablet Take 40 mg by mouth daily.   Yes [provider]  metoprolol tartrate (LOPRESSOR) 25 MG tablet Take 1 tablet (25 mg total)  by mouth 2 (two) times daily. 05/24/17  Yes Barrett, Evelene Croon, PA-C  Multiple Vitamin (MULTI-VITAMINS) TABS Take 1 tablet by mouth daily.   Yes [provider]  oxymetazoline (AFRIN) 0.05 % nasal spray Place 1 spray into both nostrils as needed for congestion.   Yes [provider]  rosuvastatin (CRESTOR) 40 MG tablet Take 0.5 tablets (20 mg total) by mouth daily. Patient taking differently: Take 20 mg by mouth at bedtime.  10/24/17  Yes [place]stone Lighter, MD  zolpidem (AMBIEN) 5 MG tablet Take 1 tablet (5 mg total) by mouth at bedtime as needed. for sleep 02/14/18  Yes  [name], MD  lisinopril (PRINIVIL,ZESTRIL) 10 MG tablet Take 1 tablet (10 mg total) by mouth daily. Patient not taking: Reported on 03/21/2018 10/24/17   [place] Lighter, MD  traMADol (ULTRAM) 50 MG tablet Take 1 tablet (50 mg total) by mouth every 6 (six) hours as needed for moderate pain. Patient not taking: Reported on 03/21/2018 02/14/18   [name], MD  ALPRAZolam Duanne Moron) 0.25 MG tablet Take 1/2-1 tablet every 8 hours as needed for anxiety. Patient not taking: Reported on 03/21/2018 11/08/17 03/21/18  [name], MD  metroNIDAZOLE (METROGEL) 0.75 % gel APPLY ON THE SKIN AS DIRECTED 1-2 TIMES A DAY TO FACE 12/15/17 03/21/18  [provider]  tacrolimus (PROTOPIC) 0.1 % ointment APPLY TO AFFECTED AREA TWICE A DAY 12/13/17 03/21/18  [provider]    Charlett Nose ,PharmD Clinical Pharmacist  03/21/2018  2:25 PM

## 2018-03-21 NOTE — ED Notes (Signed)
Dr. Doy Mince, Dr. Celene Kras, Stroke RN at bedside.

## 2018-03-21 NOTE — ED Notes (Signed)
Pt return from ct scan.  Pt alert.  Pt has a headache.  No n/v/ d  Speech clear.   Family with pt in hallway bed.

## 2018-03-21 NOTE — H&P (Addendum)
[facility] [place] NAME: Stacey [name]    MR#:  [id number]  DATE OF BIRTH:  1940/07/06  DATE OF ADMISSION:  03/21/2018  PRIMARY CARE PHYSICIAN: [name], MD   REQUESTING/REFERRING PHYSICIAN: Dr. Jimmye Norman  CHIEF COMPLAINT:   Left facial tingling and droop HISTORY OF PRESENT ILLNESS:  Stacey [name]  is a 77 y.o. female with a known history of chronic atrial fibrillation, CVA and CAD who presents emergency room due to above complaint.  Patient reports that this morning she suddenly developed left facial tingling and left-sided facial droop.  She reports no weakness in her arm or leg. In the emergency room she was evaluated by neurology who is recommended CT angios of head and neck.  She is currently on Eliquis for chronic atrial fibrillation and statin for her CAD.  She continues to have mild left facial droop with tingling and numbness.  She is also complaining of sores in her mouth which have been present for the past 3 months.  She is an appointment to see ENT tomorrow.  She has chronic left-sided weakness from previous CVA.  PAST MEDICAL HISTORY:   Past Medical History:  Diagnosis Date  . Abnormal nuclear stress test 05/24/2017  . Arthritis   . Asthma   . Atrial fibrillation, transient ([place])   . 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 Cancer ([place])   . Complication of anesthesia   . Depression   . Family history of adverse reaction to anesthesia    most of family - PONV  . Fibromyalgia   . Glaucoma   . Headache    migraines - 1-2x/mo  . HTN (hypertension) 03/09/2007  . Hyperlipidemia LDL goal <70 03/25/1993  . Hypertension   . Motion sickness    all moving vehicles  . PONV (postoperative nausea and vomiting)   . Thyroid disease     PAST SURGICAL HISTORY:   Past Surgical History:  Procedure Laterality Date  . ABDOMINAL HYSTERECTOMY    . APPENDECTOMY    .  BLADDER SURGERY    . CATARACT EXTRACTION W/ INTRAOCULAR LENS  IMPLANT, BILATERAL    . COLONOSCOPY WITH PROPOFOL N/A 01/17/2016   Procedure: COLONOSCOPY WITH PROPOFOL;  Surgeon: [name], MD;  Location: [facility];  Service: Endoscopy;  Laterality: N/A;  . CORONARY ARTERY BYPASS GRAFT N/A 02/20/2017   Procedure: CORONARY ARTERY BYPASS GRAFTING (CABG) x , three using left internal mammary artery to left anterior descending coronary artery and right greater saphenous vein harvested endoscopically to distal right and diagonal coronary arteries.;  Surgeon: Grace Isaac, MD;  Location: [facility];  Service: Open Heart Surgery;  Laterality: N/A;  . LEFT HEART CATH AND CORONARY ANGIOGRAPHY N/A 02/20/2017   Procedure: LEFT HEART CATH AND CORONARY ANGIOGRAPHY;  Surgeon: Isaias Cowman, MD;  Location: [facility];  Service: Cardiovascular;  Laterality: N/A;  . LEFT HEART CATH AND CORS/GRAFTS ANGIOGRAPHY N/A 05/24/2017   Procedure: LEFT HEART CATH AND CORS/GRAFTS ANGIOGRAPHY;  Surgeon: Sherren Mocha, MD;  Location: [place];  Service: Cardiovascular;  Laterality: N/A;  . NASAL SINUS SURGERY    . POLYPECTOMY  01/17/2016   Procedure: POLYPECTOMY;  Surgeon: [name], MD;  Location: [facility];  Service: Endoscopy;;  . RIGHT OOPHORECTOMY    . TEE WITHOUT CARDIOVERSION N/A 02/20/2017   Procedure: TRANSESOPHAGEAL ECHOCARDIOGRAM (TEE);  Surgeon: Grace Isaac, MD;  Location[facility];  Service: Open Heart Surgery;  Laterality: N/A;    SOCIAL HISTORY:   Social History   Tobacco Use  . Smoking status: Former Smoker    Last attempt to quit: 05/26/1975    Years since quitting: 42.8  . Smokeless tobacco: Never Used  . Tobacco comment: quit 45  years ago   Substance Use Topics  . Alcohol use: No    FAMILY HISTORY:   Family History  Problem Relation Age of Onset  . Alzheimer's disease Mother   . Kidney cancer Mother   . Heart attack Father   . Pancreatic  cancer Sister   . Heart attack Brother 72  . Ovarian cancer Sister   . Kidney disease Sister        dialysis  . Cancer Sister        uterin  . Diabetes Sister   . Heart attack Sister   . Heart attack Brother 22  . Ovarian cancer Sister     DRUG ALLERGIES:   Allergies  Allergen Reactions  . Cephalexin Anaphylaxis  . Atorvastatin   . Morphine And Related Hives  . Procaine Other (See Comments)    Chest pain Chest pain  . Shellfish Allergy Swelling    angioedema  . Statins Other (See Comments)    Muscle pain  . Tomato Other (See Comments)    (by testing) - also beans, wheat, peas (by testing) - also beans, wheat, peas  . Penicillins Hives and Rash    Has patient had a PCN reaction causing immediate rash, facial/tongue/throat swelling, SOB or lightheadedness with hypotension: Yes Has patient had a PCN reaction causing severe rash involving mucus membranes or skin necrosis: No Has patient had a PCN reaction that required hospitalization: No Has patient had a PCN reaction occurring within the last 10 years: Yes If all of the above answers are "NO", then may proceed with Cephalosporin use.    REVIEW OF SYSTEMS:   Review of Systems  Constitutional: Negative.  Negative for chills, fever and malaise/fatigue.  HENT: Negative.  Negative for ear discharge, ear pain, hearing loss, nosebleeds and sore throat.   Eyes: Negative.  Negative for blurred vision and pain.       Peripheral vision loss  Respiratory: Negative.  Negative for cough, hemoptysis, shortness of breath and wheezing.   Cardiovascular: Negative.  Negative for chest pain, palpitations and leg swelling.  Gastrointestinal: Negative.  Negative for abdominal pain, blood in stool, diarrhea, nausea and vomiting.  Genitourinary: Negative.  Negative for dysuria.  Musculoskeletal: Negative.  Negative for back pain.  Skin: Negative.   Neurological: Negative for dizziness, tremors, speech change, focal weakness, seizures and  headaches.  Endo/Heme/Allergies: Negative.  Does not bruise/bleed easily.  Psychiatric/Behavioral: Negative.  Negative for depression, hallucinations and suicidal ideas.    MEDICATIONS AT HOME:   Prior to Admission medications   Medication Sig Start Date End Date Taking? Authorizing Provider  acetaminophen (TYLENOL) 325 MG tablet Take 2 tablets (650 mg total) by mouth every 6 (six) hours as needed for mild pain. 02/28/17  Yes Lars Pinks M, PA-C  albuterol (VENTOLIN HFA) 108 (90 Base) MCG/ACT inhaler INHALE 2 PUFFS BY MOUTH EVERY 4 TO 6 HOURS AS NEEDED FOR SHORTNESS OF BREATH Patient taking differently: Inhale 2 puffs into the lungs every 4 (four) hours as needed for wheezing or shortness of breath.  12/17/16  Yes Burnette, Clearnce Sorrel, PA-C  amLODipine (NORVASC) 5 MG tablet Take 1 tablet (5 mg total) daily  by mouth. 04/04/17  Yes [name], MD  apixaban (ELIQUIS) 5 MG TABS tablet Take 1 tablet (5 mg total) by mouth 2 (two) times daily. 10/24/17  Yes Gladstone Lighter, MD  aspirin 81 MG tablet Take 1 tablet (81 mg total) by mouth daily. 10/24/17  Yes Gladstone Lighter, MD  buPROPion (WELLBUTRIN XL) 300 MG 24 hr tablet TAKE 1 TABLET BY MOUTH EVERY DAY Patient taking differently: Take 300 mg by mouth daily.  03/16/18  Yes [name], MD  capsaicin (ZOSTRIX) 0.025 % cream Apply 1 application topically as needed (nerve pain in chest).   Yes [provider]  Cholecalciferol (VITAMIN D) 2000 units tablet Take 2,000 Units by mouth daily.   Yes [provider]  Cyanocobalamin (B-12) 500 MCG TABS Take 500 mcg by mouth daily.    Yes [provider]  diazepam (VALIUM) 5 MG tablet Take 1 tablet (5 mg total) by mouth every 12 (twelve) hours as needed for anxiety. Patient taking differently: Take 5 mg by mouth daily as needed for anxiety.  08/30/17  Yes [name], MD  EPINEPHrine (EPIPEN 2-PAK) 0.3 mg/0.3 mL IJ SOAJ injection INJECT AS  DIRECTED FOR SEVERE ALLERGIC REACTION Patient taking differently: Inject 0.3 mg into the muscle as needed (for severe allergic reaction).  12/17/16  Yes Mar Daring, PA-C  hydrochlorothiazide (HYDRODIURIL) 12.5 MG tablet Take 12.5 mg by mouth daily.  11/01/17 11/01/18 Yes [provider]  levothyroxine (SYNTHROID, LEVOTHROID) 100 MCG tablet Take 1 tablet (100 mcg total) by mouth daily. 04/30/17  Yes [name], MD  lisinopril (PRINIVIL,ZESTRIL) 40 MG tablet Take 40 mg by mouth daily.   Yes [provider]  metoprolol tartrate (LOPRESSOR) 25 MG tablet Take 1 tablet (25 mg total) by mouth 2 (two) times daily. 05/24/17  Yes Barrett, Evelene Croon, PA-C  Multiple Vitamin (MULTI-VITAMINS) TABS Take 1 tablet by mouth daily.   Yes [provider]  oxymetazoline (AFRIN) 0.05 % nasal spray Place 1 spray into both nostrils as needed for congestion.   Yes [provider]  rosuvastatin (CRESTOR) 40 MG tablet Take 0.5 tablets (20 mg total) by mouth daily. Patient taking differently: Take 20 mg by mouth at bedtime.  10/24/17  Yes Gladstone Lighter, MD  zolpidem (AMBIEN) 5 MG tablet Take 1 tablet (5 mg total) by mouth at bedtime as needed. for sleep 02/14/18  Yes [name], MD  lisinopril (PRINIVIL,ZESTRIL) 10 MG tablet Take 1 tablet (10 mg total) by mouth daily. Patient not taking: Reported on 03/21/2018 10/24/17   Gladstone Lighter, MD  traMADol (ULTRAM) 50 MG tablet Take 1 tablet (50 mg total) by mouth every 6 (six) hours as needed for moderate pain. Patient not taking: Reported on 03/21/2018 02/14/18   [name], MD      VITAL SIGNS:  Blood pressure (!) 145/93, pulse 77, temperature 98.1 F (36.7 C), temperature source Oral, resp. rate 18, height 5\' 2"  (1.575 m), weight 72.6 kg, SpO2 95 %.  PHYSICAL EXAMINATION:   Physical Exam  Constitutional: She is oriented to person, place, and time. No distress.  HENT:  Head: Normocephalic.   Sores on the left side of the mouth  Eyes: No scleral icterus.  Left eye peripheral vision loss which is at baseline  Neck: Normal range of motion. Neck supple. No JVD present. No tracheal deviation present.  Cardiovascular: Normal heart sounds. Exam reveals no gallop and no friction rub.  No  murmur heard. Irr, irr  Pulmonary/Chest: Effort normal and breath sounds normal. No respiratory distress. She has no wheezes. She has no rales. She exhibits no tenderness.  Abdominal: Soft. Bowel sounds are normal. She exhibits no distension and no mass. There is no tenderness. There is no rebound and no guarding.  Musculoskeletal: Normal range of motion. She exhibits edema (baseline due toCABG RLE).  Neurological: She is alert and oriented to person, place, and time. A sensory deficit is present.  Mild left facial droop  Skin: Skin is warm. No rash noted. No erythema.  Psychiatric: Judgment normal.      LABORATORY PANEL:   CBC Recent Labs  Lab 03/21/18 1352  WBC 4.5  HGB 13.8  HCT 41.8  PLT 140*   ------------------------------------------------------------------------------------------------------------------  Chemistries  Recent Labs  Lab 03/21/18 1352  NA 133*  K 4.0  CL 97*  CO2 28  GLUCOSE 114*  BUN 19  CREATININE 1.01*  CALCIUM 9.1  AST 26  ALT 17  ALKPHOS 73  BILITOT 0.7   ------------------------------------------------------------------------------------------------------------------  Cardiac Enzymes Recent Labs  Lab 03/21/18 1352  TROPONINI <0.03   ------------------------------------------------------------------------------------------------------------------  RADIOLOGY:  Ct Head Code Stroke Wo Contrast  Result Date: 03/21/2018 CLINICAL DATA:  Code stroke. Left facial tingling and facial droop. Rule out stroke. EXAM: CT HEAD WITHOUT CONTRAST TECHNIQUE: Contiguous axial images were obtained from the base of the skull through the vertex without  intravenous contrast. COMPARISON:  MRI head 10/23/2017 FINDINGS: Brain: Negative for acute infarct. Negative for hemorrhage mass or edema. Chronic infarct right occipital lobe. Mild chronic microvascular ischemia in the white matter. Vascular: Negative for hyperdense vessel Skull: Negative Sinuses/Orbits: Paranasal sinuses clear. Bilateral cataract surgery. Other: None ASPECTS ([place] Stroke Program Early CT Score) - Ganglionic level infarction (caudate, lentiform nuclei, internal capsule, insula, M1-M3 cortex): 7 - Supraganglionic infarction (M4-M6 cortex): 3 Total score (0-10 with 10 being normal): 10 IMPRESSION: 1. No acute intracranial abnormality. 2. ASPECTS is 10 3. These results were called by telephone at the time of interpretation on 03/21/2018 at 1:48 pm to Dr. Jimmye Norman , who verbally acknowledged these results. Electronically Signed   By: Franchot Gallo M.D.   On: 03/21/2018 13:48    EKG:   A. fib heart rate 82 no ST elevation or depression IMPRESSION AND PLAN:   77 year old female with chronic atrial fibrillation, CAD and previous stroke with some mild left residual weakness who presents today due to left facial droop and tingling and numbness of the left face.  1.  Acute CVA: Patient evaluated neurology while in the emergency room with plan for CTA head and neck. Order MRI and echocardiogram with bubble Continue aspirin and Eliquis and statin PT, OT and speech consultation  2.  Chronic atrial fibrillation: Continue Eliquis  3.  Hyperlipidemia: Check lipid panel and continue Crestor for now  4.  Essential hypertension: May continue outpatient regimen being careful not to lower blood pressure too low  5.  Depression: Continue Wellbutrin 6.  Mouth sores: Viscous lidocaine as prescribed and patient will need to follow-up with ENT as previously recommended.  All the records are reviewed and case discussed with ED provider. Management plans discussed with the patient and she is in  agreement  CODE STATUS: Full  TOTAL TIME TAKING CARE OF THIS PATIENT: 48 minutes.    Gabriela Giannelli M.D on 03/21/2018 at 2:44 PM  Between 7am to 6pm - Pager - [phone]  After 6pm go to [email or web address] - [place]  Office  [phone]  CC: Primary care physician; [name], MD

## 2018-03-21 NOTE — ED Notes (Signed)
Report called to brandy rn floor nurse 

## 2018-03-21 NOTE — Code Documentation (Signed)
PT arrives with complaints of left sided numbness, weakness and left visual changes, pt states hx of CVA with left sided deficits, states symptoms began at 1230, code stroke activated and pt taken to CT for non con head CT, after CT pt taken to room [place], initial NIHSS 3, CTA ordered, report off to [facility]

## 2018-03-21 NOTE — ED Provider Notes (Signed)
[facility] Emergency Department Provider Note       Time seen: ----------------------------------------- 1:45 PM on 03/21/2018 -----------------------------------------   I have reviewed the triage vital signs and the nursing notes.  HISTORY   Chief Complaint No chief complaint on file.    HPI Stacey [name] is a 77 y.o. female with a history of atrial for ablation, coronary artery disease, depression, fibromyalgia, headaches, CVA, hypertension, hyperlipidemia who presents to the ED for facial tingling and droop for the past hour.  Patient reportedly had a stroke affecting the left side of her body and particularly her left leg earlier this year.  Currently she is taking Eliquis.  She denies any recent illness or other complaints.  Past Medical History:  Diagnosis Date  . Abnormal nuclear stress test 05/24/2017  . Arthritis   . Asthma   . Atrial fibrillation, transient ([place])   . 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 Cancer ([place])   . Complication of anesthesia   . Depression   . Family history of adverse reaction to anesthesia    most of family - PONV  . Fibromyalgia   . Glaucoma   . Headache    migraines - 1-2x/mo  . HTN (hypertension) 03/09/2007  . Hyperlipidemia LDL goal <70 03/25/1993  . Hypertension   . Motion sickness    all moving vehicles  . PONV (postoperative nausea and vomiting)   . Thyroid disease     Patient Active Problem List   Diagnosis Date Noted  . CVA (cerebral vascular accident) ([place]) 10/22/2017  . Abnormal nuclear stress test 05/24/2017  . 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 PAF (paroxysmal atrial fibrillation) ([place])   . Non-STEMI (non-ST elevated myocardial infarction) ([place]) 02/20/2017  . 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 Moderate mitral insufficiency 01/11/2017  . Personal history of colonic polyps   . Benign neoplasm of  ascending colon   . Benign neoplasm of descending colon   . Disequilibrium 09/16/2015  . Panic attacks 09/11/2015  . Ganglion cyst 08/16/2015  . Hypernatremia 07/04/2015  . Upper back pain 07/04/2015  . Other osteoarthritis involving multiple joints 02/06/2015  . Clinical depression 09/27/2014  . Dry mouth 09/27/2014  . Fibrositis 09/27/2014  . LBP (low back pain) 09/27/2014  . Avitaminosis D 09/27/2014  . Decreased leukocytes 09/27/2014  . Abnormal blood sugar 05/03/2009  . Insomnia 09/29/2007  . HTN (hypertension) 03/09/2007  . Adaptation reaction 01/29/2007  . Acid reflux 10/07/2006  . Menopausal and postmenopausal disorder 10/05/2006  . Headache, migraine 10/05/2006  . Arthritis, degenerative 03/23/1994  . Adult hypothyroidism 04/15/1993  . Hyperlipidemia LDL goal <70 03/25/1993    Past Surgical History:  Procedure Laterality Date  . ABDOMINAL HYSTERECTOMY    . APPENDECTOMY    . BLADDER SURGERY    . CATARACT EXTRACTION W/ INTRAOCULAR LENS  IMPLANT, BILATERAL    . COLONOSCOPY WITH PROPOFOL N/A 01/17/2016   Procedure: COLONOSCOPY WITH PROPOFOL;  Surgeon: [name], MD;  Location: [place];  Service: Endoscopy;  Laterality: N/A;  . CORONARY ARTERY BYPASS GRAFT N/A 02/20/2017   Procedure: CORONARY ARTERY BYPASS GRAFTING (CABG) x , three using left internal mammary artery to left anterior descending coronary artery and right greater saphenous vein harvested endoscopically to distal right and diagonal coronary arteries.;  Surgeon: Grace Isaac, MD;  Location: [facility];  Service: Open Heart Surgery;  Laterality: N/A;  .  LEFT HEART CATH AND CORONARY ANGIOGRAPHY N/A 02/20/2017   Procedure: LEFT HEART CATH AND CORONARY ANGIOGRAPHY;  Surgeon: Isaias Cowman, MD;  Location: [place] [facility];  Service: Cardiovascular;  Laterality: N/A;  . LEFT HEART CATH AND CORS/GRAFTS ANGIOGRAPHY N/A 05/24/2017   Procedure: LEFT HEART CATH AND CORS/GRAFTS ANGIOGRAPHY;  Surgeon:  Sherren Mocha, MD;  Location: [place] [facility];  Service: Cardiovascular;  Laterality: N/A;  . NASAL SINUS SURGERY    . POLYPECTOMY  01/17/2016   Procedure: POLYPECTOMY;  Surgeon: [name], MD;  Location: [place];  Service: Endoscopy;;  . RIGHT OOPHORECTOMY    . TEE WITHOUT CARDIOVERSION N/A 02/20/2017   Procedure: TRANSESOPHAGEAL ECHOCARDIOGRAM (TEE);  Surgeon: Grace Isaac, MD;  Location: [place];  Service: Open Heart Surgery;  Laterality: N/A;    Allergies Cephalexin; Atorvastatin; Morphine and related; Procaine; Shellfish allergy; Statins; Tomato; and Penicillins  Social History Social History   Tobacco Use  . Smoking status: Former Smoker    Last attempt to quit: 05/26/1975    Years since quitting: 42.8  . Smokeless tobacco: Never Used  . Tobacco comment: quit 45  years ago   Substance Use Topics  . Alcohol use: No  . Drug use: No   Review of Systems Constitutional: Negative for fever. Cardiovascular: Negative for chest pain. Respiratory: Negative for shortness of breath. Gastrointestinal: Negative for abdominal pain, vomiting and diarrhea. Musculoskeletal: Negative for back pain. Skin: Negative for rash. Neurological: Negative for headaches, positive for left facial droop, facial tingling and decreased sensation in the left leg  All systems negative/normal/unremarkable except as stated in the HPI  ____________________________________________   PHYSICAL EXAM:  VITAL SIGNS: ED Triage Vitals  Enc Vitals Group     BP      Pulse      Resp      Temp      Temp src      SpO2      Weight      Height      Head Circumference      Peak Flow      Pain Score      Pain Loc      Pain Edu?      Excl. in [place]?    Constitutional: Alert and oriented. Well appearing and in no distress. Eyes: Conjunctivae are normal. Normal extraocular movements. ENT   Head: Normocephalic and atraumatic.   Nose: No congestion/rhinnorhea.   Mouth/Throat: Mucous  membranes are moist.   Neck: No stridor. Cardiovascular: Normal rate, regular rhythm. No murmurs, rubs, or gallops. Respiratory: Normal respiratory effort without tachypnea nor retractions. Breath sounds are clear and equal bilaterally. No wheezes/rales/rhonchi. Gastrointestinal: Soft and nontender. Normal bowel sounds Musculoskeletal: Nontender with normal range of motion in extremities. No lower extremity tenderness nor edema. Neurologic:  Normal speech and language.  Left facial paresthesias, left lower extremity paresthesias Skin:  Skin is warm, dry and intact. No rash noted. Psychiatric: Mood and affect are normal. Speech and behavior are normal.  ____________________________________________  EKG: Interpreted by me.  Atrial fibrillation with a rate of 82 bpm, possible septal infarct age-indeterminate, normal axis, normal QT  ____________________________________________  ED COURSE:  As part of my medical decision making, I reviewed the following data within the [place] History obtained from family if available, nursing notes, old chart and ekg, as well as notes from prior ED visits. Patient presented for acute strokelike symptoms, we will assess with labs and imaging as indicated at this  time.   Procedures ____________________________________________   LABS (pertinent positives/negatives)  Labs Reviewed  APTT - Abnormal; Notable for the following components:      Result Value   aPTT 38 (*)    All other components within normal limits  CBC - Abnormal; Notable for the following components:   Platelets 140 (*)    All other components within normal limits  GLUCOSE, CAPILLARY - Abnormal; Notable for the following components:   Glucose-Capillary 115 (*)    All other components within normal limits  PROTIME-INR  DIFFERENTIAL  COMPREHENSIVE METABOLIC PANEL  TROPONIN I  CBG MONITORING, ED    RADIOLOGY Images were viewed by me  CT head is  negative IMPRESSION: 1. No acute intracranial abnormality. 2. ASPECTS is 10 3. These results were called by telephone at the time of interpretation on 03/21/2018 at 1:48 pm to Dr. Jimmye Norman , who verbally acknowledged these results. ____________________________________________  DIFFERENTIAL DIAGNOSIS   TIA, CVA, anxiety  FINAL ASSESSMENT AND PLAN  Paresthesia, possible TIA   Plan: The patient had presented for left facial numbness. Patient's labs is unremarkable. Patient's imaging is also unremarkable regarding initial CT imaging.  She has been seen by neurology and a CT angiogram has been ordered.  Plan will be for hospital observation.   Laurence Aly, MD   Note: This note was generated in part or whole with voice recognition software. Voice recognition is usually quite accurate but there are transcription errors that can and very often do occur. I apologize for any typographical errors that were not detected and corrected.     Earleen [place], MD 03/21/18 [phone]

## 2018-03-21 NOTE — ED Notes (Signed)
Resumed care from [facility] rn.  Pt in hallway bed.  Pt in ct scan.  Family at bedside.  Pt alert.  Skin warm and dry.

## 2018-03-21 NOTE — ED Notes (Signed)
CODE STROKE CALLED TO 333 

## 2018-03-21 NOTE — ED Notes (Signed)
Pt alert   Pt waiting on mri.  Family with pt in hallway bed.

## 2018-03-21 NOTE — ED Triage Notes (Signed)
Pt presents with slight facial droop and numbness to left side of face, as well as "seeing prisms" in her left eye x 1 hour. Pt has prior CVA. Pt taken directly to CT. Pt alert & oriented; denies pain.

## 2018-03-21 NOTE — Progress Notes (Signed)
   03/21/18 1500  Clinical Encounter Type  Visited With Patient;Family  Visit Type Initial;Code (Code Stroke)  Referral From Nurse  Recommendations Follow-up, as needed.  Spiritual Encounters  Spiritual Needs Emotional;Prayer  Stress Factors  Patient Stress Factors Health changes   Chaplain responded to Code Stroke to the patient's location. While the patient received care, Chaplain offered energetic prayer and presence. When possible, Chaplain spoke with the patient, offered prayer, and assisted the patient in mindful breathing to help calm her. Chaplain had met the patient previously which also helped lessen her anxiety.

## 2018-03-21 NOTE — ED Notes (Signed)
NIHSS score 3 per Minette Brine, RN.

## 2018-03-21 NOTE — ED Triage Notes (Signed)
Left side facial tingling and droop for 1 hour.  Says she stays in a fib and is on eloquis.

## 2018-03-21 NOTE — Consult Note (Signed)
Referring Physician: Mody    Chief Complaint: Visual changes, left sided numbness  HPI: Stacey [name] is an 77 y.o. female with a history of stroke on Eliquis who reports that at 21 today had acute onset of visual scotomas and left sided numbness with worsening weakness.  Patient with a history of stroke in May of this year.  Patient has visual changes at the onset of this event as well.  Has residual left hemiparesis that worsened today.  Patient now returning to baseline.   Initial NIHSS of 3.    Date last known well: Date: 03/21/2018 Time last known well: Time: 12:30 tPA Given: No: Improving symptoms, on Eliquis  Past Medical History:  Diagnosis Date  . Abnormal nuclear stress test 05/24/2017  . Arthritis   . Asthma   . Atrial fibrillation, transient ([place])   . 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 Cancer ([place])   . Complication of anesthesia   . Depression   . Family history of adverse reaction to anesthesia    most of family - PONV  . Fibromyalgia   . Glaucoma   . Headache    migraines - 1-2x/mo  . HTN (hypertension) 03/09/2007  . Hyperlipidemia LDL goal <70 03/25/1993  . Hypertension   . Motion sickness    all moving vehicles  . PONV (postoperative nausea and vomiting)   . Thyroid disease     Past Surgical History:  Procedure Laterality Date  . ABDOMINAL HYSTERECTOMY    . APPENDECTOMY    . BLADDER SURGERY    . CATARACT EXTRACTION W/ INTRAOCULAR LENS  IMPLANT, BILATERAL    . COLONOSCOPY WITH PROPOFOL N/A 01/17/2016   Procedure: COLONOSCOPY WITH PROPOFOL;  Surgeon: [name], MD;  Location: [facility];  Service: Endoscopy;  Laterality: N/A;  . CORONARY ARTERY BYPASS GRAFT N/A 02/20/2017   Procedure: CORONARY ARTERY BYPASS GRAFTING (CABG) x , three using left internal mammary artery to left anterior descending coronary artery and right greater saphenous vein harvested endoscopically to distal right and diagonal  coronary arteries.;  Surgeon: Grace Isaac, MD;  Location: [facility];  Service: Open Heart Surgery;  Laterality: N/A;  . LEFT HEART CATH AND CORONARY ANGIOGRAPHY N/A 02/20/2017   Procedure: LEFT HEART CATH AND CORONARY ANGIOGRAPHY;  Surgeon: Isaias Cowman, MD;  Location: [facility];  Service: Cardiovascular;  Laterality: N/A;  . LEFT HEART CATH AND CORS/GRAFTS ANGIOGRAPHY N/A 05/24/2017   Procedure: LEFT HEART CATH AND CORS/GRAFTS ANGIOGRAPHY;  Surgeon: Sherren Mocha, MD;  Location: [place] [facility];  Service: Cardiovascular;  Laterality: N/A;  . NASAL SINUS SURGERY    . POLYPECTOMY  01/17/2016   Procedure: POLYPECTOMY;  Surgeon: [name], MD;  Location: [facility];  Service: Endoscopy;;  . RIGHT OOPHORECTOMY    . TEE WITHOUT CARDIOVERSION N/A 02/20/2017   Procedure: TRANSESOPHAGEAL ECHOCARDIOGRAM (TEE);  Surgeon: Grace Isaac, MD;  Location: [facility];  Service: Open Heart Surgery;  Laterality: N/A;    Family History  Problem Relation Age of Onset  . Alzheimer's disease Mother   . Kidney cancer Mother   . Heart attack Father   . Pancreatic cancer Sister   . Heart attack Brother 20  . Ovarian cancer Sister   . Kidney disease Sister        dialysis  . Cancer Sister        uterin  . Diabetes Sister   . Heart attack  Sister   . Heart attack Brother 65  . Ovarian cancer Sister    Social History:  reports that she quit smoking about 42 years ago. She has never used smokeless tobacco. She reports that she does not drink alcohol or use drugs.  Allergies:  Allergies  Allergen Reactions  . Cephalexin Anaphylaxis  . Atorvastatin   . Morphine And Related Hives  . Procaine Other (See Comments)    Chest pain Chest pain  . Shellfish Allergy Swelling    angioedema  . Statins Other (See Comments)    Muscle pain  . Tomato Other (See Comments)    (by testing) - also beans, wheat, peas (by testing) - also beans, wheat, peas  . Penicillins Hives and Rash     Has patient had a PCN reaction causing immediate rash, facial/tongue/throat swelling, SOB or lightheadedness with hypotension: Yes Has patient had a PCN reaction causing severe rash involving mucus membranes or skin necrosis: No Has patient had a PCN reaction that required hospitalization: No Has patient had a PCN reaction occurring within the last 10 years: Yes If all of the above answers are "NO", then may proceed with Cephalosporin use.    Medications: I have reviewed the patient's current medications. Prior to Admission:  Prior to Admission medications   Medication Sig Start Date End Date Taking? Authorizing Provider  acetaminophen (TYLENOL) 325 MG tablet Take 2 tablets (650 mg total) by mouth every 6 (six) hours as needed for mild pain. 02/28/17  Yes Lars Pinks M, PA-C  albuterol (VENTOLIN HFA) 108 (90 Base) MCG/ACT inhaler INHALE 2 PUFFS BY MOUTH EVERY 4 TO 6 HOURS AS NEEDED FOR SHORTNESS OF BREATH Patient taking differently: Inhale 2 puffs into the lungs every 4 (four) hours as needed for wheezing or shortness of breath.  12/17/16  Yes Mar Daring, PA-C  amLODipine (NORVASC) 5 MG tablet Take 1 tablet (5 mg total) daily by mouth. 04/04/17  Yes [name], MD  apixaban (ELIQUIS) 5 MG TABS tablet Take 1 tablet (5 mg total) by mouth 2 (two) times daily. 10/24/17  Yes Gladstone Lighter, MD  aspirin 81 MG tablet Take 1 tablet (81 mg total) by mouth daily. 10/24/17  Yes Gladstone Lighter, MD  buPROPion (WELLBUTRIN XL) 300 MG 24 hr tablet TAKE 1 TABLET BY MOUTH EVERY DAY Patient taking differently: Take 300 mg by mouth daily.  03/16/18  Yes [name], MD  capsaicin (ZOSTRIX) 0.025 % cream Apply 1 application topically as needed (nerve pain in chest).   Yes [provider]  Cholecalciferol (VITAMIN D) 2000 units tablet Take 2,000 Units by mouth daily.   Yes [provider]  Cyanocobalamin (B-12) 500 MCG TABS Take 500 mcg by mouth daily.     Yes [provider]  diazepam (VALIUM) 5 MG tablet Take 1 tablet (5 mg total) by mouth every 12 (twelve) hours as needed for anxiety. Patient taking differently: Take 5 mg by mouth daily as needed for anxiety.  08/30/17  Yes [name], MD  EPINEPHrine (EPIPEN 2-PAK) 0.3 mg/0.3 mL IJ SOAJ injection INJECT AS DIRECTED FOR SEVERE ALLERGIC REACTION Patient taking differently: Inject 0.3 mg into the muscle as needed (for severe allergic reaction).  12/17/16  Yes Mar Daring, PA-C  hydrochlorothiazide (HYDRODIURIL) 12.5 MG tablet Take 12.5 mg by mouth daily.  11/01/17 11/01/18 Yes [provider]  levothyroxine (SYNTHROID, LEVOTHROID) 100 MCG tablet Take 1 tablet (100 mcg total) by mouth daily. 04/30/17  Yes [name], MD  lisinopril (PRINIVIL,ZESTRIL) 40 MG tablet Take 40 mg by mouth daily.   Yes [provider]  metoprolol tartrate (LOPRESSOR) 25 MG tablet Take 1 tablet (25 mg total) by mouth 2 (two) times daily. 05/24/17  Yes Barrett, Evelene Croon, PA-C  Multiple Vitamin (MULTI-VITAMINS) TABS Take 1 tablet by mouth daily.   Yes [provider]  oxymetazoline (AFRIN) 0.05 % nasal spray Place 1 spray into both nostrils as needed for congestion.   Yes [provider]  rosuvastatin (CRESTOR) 40 MG tablet Take 0.5 tablets (20 mg total) by mouth daily. Patient taking differently: Take 20 mg by mouth at bedtime.  10/24/17  Yes Gladstone Lighter, MD  zolpidem (AMBIEN) 5 MG tablet Take 1 tablet (5 mg total) by mouth at bedtime as needed. for sleep 02/14/18  Yes [name], MD  lisinopril (PRINIVIL,ZESTRIL) 10 MG tablet Take 1 tablet (10 mg total) by mouth daily. Patient not taking: Reported on 03/21/2018 10/24/17   Gladstone Lighter, MD  traMADol (ULTRAM) 50 MG tablet Take 1 tablet (50 mg total) by mouth every 6 (six) hours as needed for moderate pain. Patient not taking: Reported on 03/21/2018 02/14/18   [name], MD    ROS: History obtained from the patient  General ROS: negative for - chills, fatigue, fever, night sweats, weight gain or weight loss Psychological ROS: negative for - behavioral disorder, hallucinations, memory difficulties, mood swings or suicidal ideation Ophthalmic ROS: negative for - blurry vision, double vision, eye pain or loss of vision ENT ROS: negative for - epistaxis, nasal discharge, oral lesions, sore throat, tinnitus or vertigo Allergy and Immunology ROS: negative for - hives or itchy/watery eyes Hematological and Lymphatic ROS: negative for - bleeding problems, bruising or swollen lymph nodes Endocrine ROS: negative for - galactorrhea, hair pattern changes, polydipsia/polyuria or temperature intolerance Respiratory ROS: negative for - cough, hemoptysis, shortness of breath or wheezing Cardiovascular ROS: negative for - chest pain, dyspnea on exertion, edema or irregular heartbeat Gastrointestinal ROS: negative for - abdominal pain, diarrhea, hematemesis, nausea/vomiting or stool incontinence Genito-Urinary ROS: negative for - dysuria, hematuria, incontinence or urinary frequency/urgency Musculoskeletal ROS: negative for - joint swelling or muscular weakness Neurological ROS: as noted in HPI Dermatological ROS: negative for rash and skin lesion changes  Physical Examination: Blood pressure (!) 153/78, pulse 75, temperature 98.1 F (36.7 C), temperature source Oral, resp. rate 20, height 5\' 2"  (1.575 m), weight 72.6 kg, SpO2 96 %.  HEENT-  Normocephalic, no lesions, without obvious abnormality.  Normal external eye and conjunctiva.  Normal TM's bilaterally.  Normal auditory canals and external ears. Normal external nose, mucus membranes and septum.  Normal pharynx. Cardiovascular- S1, S2 normal, pulses palpable throughout   Lungs- chest clear, no wheezing, rales, normal symmetric air entry,  Abdomen- soft, non-tender; bowel sounds normal; no masses,  no  organomegaly Extremities- no edema Lymph-no adenopathy palpable Musculoskeletal-arthritic changes Skin-warm and dry, no hyperpigmentation, vitiligo, or suspicious lesions  Neurological Examination   Mental Status: Alert, oriented, thought content appropriate.  Speech fluent without evidence of aphasia.  Able to follow 3 step commands without difficulty.  Mildly dysarthric Cranial Nerves: II: Discs flat bilaterally; Left inferior visual field cut, pupils equal, round, reactive to light and accommodation III,IV, VI: ptosis not present, extra-ocular motions intact bilaterally V,VII: left facial droop, facial light touch sensation decreased on the left VIII: hearing normal bilaterally IX,X: gag reflex present XI: bilateral shoulder shrug XII: midline tongue extension  Motor: Right : Upper extremity   5/5    Left:     Upper extremity   5/5  Lower extremity   5/5     Lower extremity   4+/5 Tone and bulk:normal tone throughout; no atrophy noted Sensory: Pinprick and light touch decreased in the LLE Deep Tendon Reflexes: 2+ and symmetric with absent AJ;s bilaterally Plantars: Right: mute   Left: mute Cerebellar: normal finger-to-nose, normal rapid alternating movements and normal heel-to-shin test Gait: not tested due to safety concerns  Laboratory Studies:  Basic Metabolic Panel: Recent Labs  Lab 03/21/18 1352  NA 133*  K 4.0  CL 97*  CO2 28  GLUCOSE 114*  BUN 19  CREATININE 1.01*  CALCIUM 9.1    Liver Function Tests: Recent Labs  Lab 03/21/18 1352  AST 26  ALT 17  ALKPHOS 73  BILITOT 0.7  PROT 7.0  ALBUMIN 4.2   No results for input(s): LIPASE, AMYLASE in the last 168 hours. No results for input(s): AMMONIA in the last 168 hours.  CBC: Recent Labs  Lab 03/21/18 1352  WBC 4.5  NEUTROABS 2.9  HGB 13.8  HCT 41.8  MCV 86.5  PLT 140*    Cardiac Enzymes: Recent Labs  Lab 03/21/18 1352  TROPONINI <0.03    BNP: Invalid input(s): POCBNP  CBG: Recent  Labs  Lab 03/21/18 1345  GLUCAP 115*    Microbiology: Results for orders placed or performed in visit on 09/16/17  Urine Culture     Status: Abnormal   Collection Time: 09/16/17  8:33 AM  Result Value Ref Range Status   Urine Culture, Routine Final report (A)  Final   Organism ID, Bacteria Escherichia coli (A)  Final    Comment: 10,000-25,000 colony forming units per mL Cefazolin <=4 ug/mL Cefazolin with an MIC <=16 predicts susceptibility to the oral agents cefaclor, cefdinir, cefpodoxime, cefprozil, cefuroxime, cephalexin, and loracarbef when used for therapy of uncomplicated urinary tract infections due to E. coli, Klebsiella pneumoniae, and Proteus mirabilis.    Antimicrobial Susceptibility Comment  Final    Comment:       ** S = Susceptible; I = Intermediate; R = Resistant **                    P = Positive; N = Negative             MICS are expressed in micrograms per mL    Antibiotic                 RSLT#1    RSLT#2    RSLT#3    RSLT#4 Amoxicillin/Clavulanic Acid    S Ampicillin                     R Cefepime                       S Ceftriaxone                    S Cefuroxime                     S Ciprofloxacin                  S Ertapenem                      S Gentamicin  S Imipenem                       S Levofloxacin                   S Meropenem                      S Nitrofurantoin                 S Piperacillin/Tazobactam        S Tetracycline                   S Tobramycin                     S Trimethoprim/Sulfa             S   Microscopic Examination     Status: Abnormal   Collection Time: 09/16/17  8:33 AM  Result Value Ref Range Status   WBC, UA 6-10 (A) 0 - 5 /hpf Final   RBC, UA >30 (A) 0 - 2 /hpf Final   Epithelial Cells (non renal) None seen 0 - 10 /hpf Final   Casts None seen None seen /lpf Final   Mucus, UA Present Not Estab. Final   Bacteria, UA Few None seen/Few Final    Coagulation Studies: Recent Labs     03/21/18 1352  LABPROT 14.3  INR 1.12    Urinalysis: No results for input(s): COLORURINE, LABSPEC, PHURINE, GLUCOSEU, HGBUR, BILIRUBINUR, KETONESUR, PROTEINUR, UROBILINOGEN, NITRITE, LEUKOCYTESUR in the last 168 hours.  Invalid input(s): APPERANCEUR  Lipid Panel:    Component Value Date/Time   CHOL 137 10/23/2017 0431   CHOL 155 07/12/2017 0803   TRIG 119 10/23/2017 0431   HDL 45 10/23/2017 0431   HDL 54 07/12/2017 0803   CHOLHDL 3.0 10/23/2017 0431   VLDL 24 10/23/2017 0431   LDLCALC 68 10/23/2017 0431   LDLCALC 71 07/12/2017 0803   LDLCALC 86 04/29/2017 0801    HgbA1C:  Lab Results  Component Value Date   HGBA1C 5.4 10/22/2017    Urine Drug Screen:  No results found for: LABOPIA, COCAINSCRNUR, LABBENZ, AMPHETMU, THCU, LABBARB  Alcohol Level: No results for input(s): ETH in the last 168 hours.  Other results: EKG: atrial fibrillation, rate 82 bpm.  Imaging: Ct Angio Head W Or Wo Contrast  Addendum Date: 03/21/2018   ADDENDUM REPORT: 03/21/2018 15:58 ADDENDUM: Study discussed by telephone with Dr. Alexis Goodell on 03/21/2018 at 15:58 . Electronically Signed   By: Genevie Ann M.D.   On: 03/21/2018 15:58   Result Date: 03/21/2018 CLINICAL DATA:  77 year old female code stroke with left face tingling injury. EXAM: CT ANGIOGRAPHY HEAD AND NECK TECHNIQUE: Multidetector CT imaging of the head and neck was performed using the standard protocol during bolus administration of intravenous contrast. Multiplanar CT image reconstructions and MIPs were obtained to evaluate the vascular anatomy. Carotid stenosis measurements (when applicable) are obtained utilizing NASCET criteria, using the distal internal carotid diameter as the denominator. CONTRAST:  80mL OMNIPAQUE IOHEXOL 350 MG/ML SOLN COMPARISON:  Head CT without contrast 1338 hours today. Brain MRI 10/23/2017. FINDINGS: CTA NECK Skeleton: Prior sternotomy. No acute osseous abnormality identified. Upper chest: Mosaic attenuation in  the upper lungs with mild septal thickening. No superior mediastinal lymphadenopathy. There is mild to moderate enlargement of the central pulmonary arteries (main pulmonary artery diameter 38 millimeters) which appear patent. Other neck: Negative.  Aortic arch: Prior CABG. Calcified aortic atherosclerosis. Bovine arch configuration. Right carotid system: Tortuous brachiocephalic artery and proximal right CCA without stenosis. Bulky calcified plaque at the right ICA origin and bulb results in less than 50 % stenosis with respect to the distal vessel (series 7, image 117). Partially retropharyngeal course of the right ICA with no additional stenosis to the skull base. Left carotid system: Bovine left CCA origin with plaque but no stenosis. Tortuous proximal left CCA. Calcified plaque at the posterior right ICA bulb resulting in less than 50 % stenosis with respect to the distal vessel. Partially retropharyngeal course of the cervical right ICA. Tortuosity with additional calcified plaque just below the skull base (series 7, image 105) where the tortuosity causes a kinked appearance but otherwise no stenosis. Vertebral arteries: Tortuous proximal right subclavian artery without stenosis. Normal right vertebral artery origin. Dominant right vertebral artery is tortuous distally and patent to the skull base without stenosis. No proximal left subclavian artery stenosis despite soft and calcified plaque. The left vertebral artery is non dominant and patent at its origin. There is mild to moderate left vertebral origin and left V1 segment stenosis related the soft plaque (series 7, image 172). Enhancement gradually declines in the left vertebral approaching the skull base, and the vessel appears occluded at the C2 level. CTA HEAD Posterior circulation: The dominant distal right vertebral artery is patent with normal right PICA origin. The vertebrobasilar junction is patent, and there is a short segment of retrograde  enhancement in the distal left vertebral artery V4 segment (series 9, image 44) but 4 V4 segment enhancement otherwise. The left V3 segment appears occluded. The distal left vertebral artery flow void was abnormal on the June MRI. The basilar artery is patent without stenosis. The left AIC scratched at the left AICA is patent and may be reconstituting the left PICA. The SCA and right PCA origins are normal. There is a fetal type left PCA origin but superimposed moderate tandem stenoses in the left P1 segment. The distal left PCA enhancement is normal. The right PCA branches are normal. The right posterior communicating artery is diminutive. Anterior circulation: Both ICA siphons are patent. 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 The left siphon is mildly ectatic. There is up to moderate calcified plaque most pronounced in the supraclinoid segment, but only mild left supraclinoid stenosis. Left ophthalmic and posterior communicating artery origins are normal. Right ICA siphon calcified plaque with mild supraclinoid stenosis. Normal right ophthalmic and posterior communicating artery origins. Patent carotid termini. Normal MCA and ACA origins. Anterior communicating artery and bilateral ACA branches are within normal limits. Left MCA M1 segment, left MCA bifurcation, and left MCA branches are within normal limits. Right MCA M1 segment, bifurcation, and right MCA branches are within normal limits. Venous sinuses: Patent on the delayed images. Anatomic variants: Bovine arch configuration. Dominant right vertebral artery. Fetal type left PCA origin. Delayed phase: Stable gray-white matter differentiation throughout the brain. Small area of right parietal lobe encephalomalacia re-demonstrated. No abnormal enhancement identified. Review of the MIP images confirms the above findings IMPRESSION: 1. 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 There is relatively poor reconstitution of the left V4 segment from the vertebrobasilar junction. The left vertebral artery is non-dominant and there are tandem atherosclerotic stenosis of the vessel in the V1 segment. 2. Negative for emergent large vessel occlusion. 3. Positive  for fusiform enlargement and tortuosity of the distal cervical Left ICA which may reflect fibromuscular dysplasia (FMD). 4. Bilateral calcified carotid atherosclerosis in the head and neck, although no hemodynamically significant stenosis. 5. Moderate tandem stenoses of the left P1 segment with normal left PCA branches. 6. Stable CT appearance of the brain since 1338 hours today. 7. Enlarged central pulmonary arteries suggesting pulmonary arterial hypertension. 8.  Aortic Atherosclerosis (ICD10-I70.0).  Prior CABG. Electronically Signed: By: Genevie Ann M.D. On: 03/21/2018 15:52   Ct Angio Neck W Or Wo Contrast  Addendum Date: 03/21/2018   ADDENDUM REPORT: 03/21/2018 15:58 ADDENDUM: Study discussed by telephone with Dr. Alexis Goodell on 03/21/2018 at 15:58 . Electronically Signed   By: Genevie Ann M.D.   On: 03/21/2018 15:58   Result Date: 03/21/2018 CLINICAL DATA:  78 year old female code stroke with left face tingling injury. EXAM: CT ANGIOGRAPHY HEAD AND NECK TECHNIQUE: Multidetector CT imaging of the head and neck was performed using the standard protocol during bolus administration of intravenous contrast. Multiplanar CT image reconstructions and MIPs were obtained to evaluate the vascular anatomy. Carotid stenosis measurements (when applicable) are obtained utilizing NASCET criteria, using the distal internal carotid diameter as the denominator. CONTRAST:  46mL OMNIPAQUE IOHEXOL 350 MG/ML SOLN COMPARISON:  Head CT without contrast 1338 hours today. Brain MRI 10/23/2017. FINDINGS: CTA NECK Skeleton: Prior sternotomy. No acute osseous abnormality identified. Upper chest: Mosaic attenuation in the  upper lungs with mild septal thickening. No superior mediastinal lymphadenopathy. There is mild to moderate enlargement of the central pulmonary arteries (main pulmonary artery diameter 38 millimeters) which appear patent. Other neck: Negative. Aortic arch: Prior CABG. Calcified aortic atherosclerosis. Bovine arch configuration. Right carotid system: Tortuous brachiocephalic artery and proximal right CCA without stenosis. Bulky calcified plaque at the right ICA origin and bulb results in less than 50 % stenosis with respect to the distal vessel (series 7, image 117). Partially retropharyngeal course of the right ICA with no additional stenosis to the skull base. Left carotid system: Bovine left CCA origin with plaque but no stenosis. Tortuous proximal left CCA. Calcified plaque at the posterior right ICA bulb resulting in less than 50 % stenosis with respect to the distal vessel. Partially retropharyngeal course of the cervical right ICA. Tortuosity with additional calcified plaque just below the skull base (series 7, image 105) where the tortuosity causes a kinked appearance but otherwise no stenosis. Vertebral arteries: Tortuous proximal right subclavian artery without stenosis. Normal right vertebral artery origin. Dominant right vertebral artery is tortuous distally and patent to the skull base without stenosis. No proximal left subclavian artery stenosis despite soft and calcified plaque. The left vertebral artery is non dominant and patent at its origin. There is mild to moderate left vertebral origin and left V1 segment stenosis related the soft plaque (series 7, image 172). Enhancement gradually declines in the left vertebral approaching the skull base, and the vessel appears occluded at the C2 level. CTA HEAD Posterior circulation: The dominant distal right vertebral artery is patent with normal right PICA origin. The vertebrobasilar junction is patent, and there is a short segment of retrograde enhancement  in the distal left vertebral artery V4 segment (series 9, image 44) but 4 V4 segment enhancement otherwise. The left V3 segment appears occluded. The distal left vertebral artery flow void was abnormal on the June MRI. The basilar artery is patent without stenosis. The left AIC scratched at the left AICA is patent and may be reconstituting the left PICA. The SCA  and right PCA origins are normal. There is a fetal type left PCA origin but superimposed moderate tandem stenoses in the left P1 segment. The distal left PCA enhancement is normal. The right PCA branches are normal. The right posterior communicating artery is diminutive. Anterior circulation: Both ICA siphons are patent. 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 The left siphon is mildly ectatic. There is up to moderate calcified plaque most pronounced in the supraclinoid segment, but only mild left supraclinoid stenosis. Left ophthalmic and posterior communicating artery origins are normal. Right ICA siphon calcified plaque with mild supraclinoid stenosis. Normal right ophthalmic and posterior communicating artery origins. Patent carotid termini. Normal MCA and ACA origins. Anterior communicating artery and bilateral ACA branches are within normal limits. Left MCA M1 segment, left MCA bifurcation, and left MCA branches are within normal limits. Right MCA M1 segment, bifurcation, and right MCA branches are within normal limits. Venous sinuses: Patent on the delayed images. Anatomic variants: Bovine arch configuration. Dominant right vertebral artery. Fetal type left PCA origin. Delayed phase: Stable gray-white matter differentiation throughout the brain. Small area of right parietal lobe encephalomalacia re-demonstrated. No abnormal enhancement identified. Review of the MIP images confirms the above findings IMPRESSION: 1. 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 There is relatively poor reconstitution of the left V4 segment from the vertebrobasilar junction. The left vertebral artery is non-dominant and there are tandem atherosclerotic stenosis of the vessel in the V1 segment. 2. Negative for emergent large vessel occlusion. 3. Positive for fusiform enlargement and tortuosity of the distal cervical Left ICA which may reflect fibromuscular dysplasia (FMD). 4. Bilateral calcified carotid atherosclerosis in the head and neck, although no hemodynamically significant stenosis. 5. Moderate tandem stenoses of the left P1 segment with normal left PCA branches. 6. Stable CT appearance of the brain since 1338 hours today. 7. Enlarged central pulmonary arteries suggesting pulmonary arterial hypertension. 8.  Aortic Atherosclerosis (ICD10-I70.0).  Prior CABG. Electronically Signed: By: Genevie Ann M.D. On: 03/21/2018 15:52   Ct Head Code Stroke Wo Contrast  Result Date: 03/21/2018 CLINICAL DATA:  Code stroke. Left facial tingling and facial droop. Rule out stroke. EXAM: CT HEAD WITHOUT CONTRAST TECHNIQUE: Contiguous axial images were obtained from the base of the skull through the vertex without intravenous contrast. COMPARISON:  MRI head 10/23/2017 FINDINGS: Brain: Negative for acute infarct. Negative for hemorrhage mass or edema. Chronic infarct right occipital lobe. Mild chronic microvascular ischemia in the white matter. Vascular: Negative for hyperdense vessel Skull: Negative Sinuses/Orbits: Paranasal sinuses clear. Bilateral cataract surgery. Other: None ASPECTS ([place] Stroke Program Early CT Score) - Ganglionic level infarction (caudate, lentiform nuclei, internal capsule, insula, M1-M3 cortex): 7 - Supraganglionic infarction (M4-M6 cortex): 3 Total score (0-10 with 10 being normal): 10 IMPRESSION: 1. No acute intracranial abnormality. 2. ASPECTS is 10 3. These results were called by telephone at the time of interpretation on 03/21/2018 at  1:48 pm to Dr. Jimmye Norman , who verbally acknowledged these results. Electronically Signed   By: Franchot Gallo M.D.   On: 03/21/2018 13:48    Assessment: 77 y.o. female with a history of stroke and afib on Eliquis who presents with worsening left sided symptoms.  Patient compliant with Eliquis.  Head CT reviewed and shows no acute changes.  Concern is for recurrent event despite anticoagulation.  Further work up recommended.    Stroke Risk Factors - atrial  fibrillation, hyperlipidemia and hypertension  Plan: 1. HgbA1c, fasting lipid panel 2. CTA of the head and neck.  If unremarkable patient to have MRI of the brain without contrast. 3. Continue Eliquis   4. NPO until RN stroke swallow screen 5. Telemetry monitoring 6. Frequent neuro checks 7. EEG  Case discussed with Dr. Simon Rhein, MD Neurology [phone] 03/21/2018, 5:03 PM  Addendum: CTA of the head and neck reviewed.  No evidence of acute occlusion.  Left vertebral findings likely old and not consistent with current presentation.  Alexis Goodell, MD Neurology [phone]

## 2018-03-21 NOTE — Care Management Note (Signed)
Case Management Note  Patient Details  Name: Stacey [name] MRN: [id number] Date of Birth: 05-02-1941  Subjective/Objective:       Patient is from home with husband, she is being seen in the ED for facial weakness and trouble with her vision, concern for stroke.  She has had a stroke previously.  She is currently on Eliquis at home.  Patient will be placed under observation- MOON letter signed.  PCP verified as Dr. [name].  Pharmacy is [facility].  No barriers to obtaining prescription medications.  Patient is able to drive.  After her stroke she did go home with Home health services- she thinks the company was [employer].  - No current needs identified.               Action/Plan:   Expected Discharge Date:                  Expected Discharge Plan:     In-House Referral:     Discharge planning Services  CM Consult  Post Acute Care Choice:    Choice offered to:     DME Arranged:    DME Agency:     HH Arranged:    HH Agency:     Status of Service:  In process, will continue to follow  If discussed at Long Length of Stay Meetings, dates discussed:    Additional Comments:  Shelbie Hutching, RN 03/21/2018, 3:15 PM

## 2018-03-21 NOTE — ED Notes (Signed)
Pt to mri 

## 2018-03-21 NOTE — Progress Notes (Signed)
Family Meeting Note  Advance Directive:yes  Today a meeting took place with the Patient.spouse The following clinical team members were present during this meeting:MD  The following were discussed:Patient's diagnosis stroke Chronic a  fib: , Patient's progosis: > 12 months and Goals for treatment: Full Code  Additional follow-up to be provided: FULL CODE No change in advanced directives  Time spent during discussion:16 minutes  Stacey Reede, MD

## 2018-03-21 NOTE — Care Management Obs Status (Signed)
[facility] NOTIFICATION   Patient Details  Name: Stacey [name] MRN: [id number] Date of Birth: 14-Oct-1940   Medicare Observation Status Notification Given:  Yes    Shelbie Hutching, RN 03/21/2018, 3:09 PM

## 2018-03-22 ENCOUNTER — Observation Stay
Admit: 2018-03-22 | Discharge: 2018-03-22 | Disposition: A | Discharge status: Home / Self Care | Payer: Medicare Other | Attending: Internal Medicine | Admitting: Internal Medicine

## 2018-03-22 ENCOUNTER — Observation Stay: Payer: Medicare Other

## 2018-03-22 DIAGNOSIS — R531 Weakness: Secondary | ICD-10-CM | POA: Diagnosis not present

## 2018-03-22 DIAGNOSIS — I10 Essential (primary) hypertension: Secondary | ICD-10-CM | POA: Diagnosis not present

## 2018-03-22 DIAGNOSIS — K1379 Other lesions of oral mucosa: Secondary | ICD-10-CM | POA: Diagnosis not present

## 2018-03-22 DIAGNOSIS — Z8679 Personal history of other diseases of the circulatory system: Secondary | ICD-10-CM | POA: Diagnosis not present

## 2018-03-22 DIAGNOSIS — G459 Transient cerebral ischemic attack, unspecified: Secondary | ICD-10-CM | POA: Diagnosis not present

## 2018-03-22 DIAGNOSIS — I639 Cerebral infarction, unspecified: Secondary | ICD-10-CM | POA: Diagnosis not present

## 2018-03-22 DIAGNOSIS — I6389 Other cerebral infarction: Secondary | ICD-10-CM | POA: Diagnosis not present

## 2018-03-22 LAB — ECHOCARDIOGRAM COMPLETE
Height: 62 in
Weight: 2617.6 oz

## 2018-03-22 LAB — HEMOGLOBIN A1C
HEMOGLOBIN A1C: 5.6 % (ref 4.8–5.6)
MEAN PLASMA GLUCOSE: 114.02 mg/dL

## 2018-03-22 LAB — LIPID PANEL
CHOLESTEROL: 144 mg/dL (ref 0–200)
HDL: 39 mg/dL — AB (ref >40)
LDL Cholesterol: 77 mg/dL (ref 0–99)
TRIGLYCERIDES: 141 mg/dL (ref <150)
Total CHOL/HDL Ratio: 3.7 RATIO
VLDL: 28 mg/dL (ref 0–40)

## 2018-03-22 NOTE — Progress Notes (Signed)
*  PRELIMINARY RESULTS* Echocardiogram 2D Echocardiogram has been performed.  Stacey [name] 03/22/2018, 2:44 PM

## 2018-03-22 NOTE — Progress Notes (Signed)
SLP Cancellation Note  Patient Details Name: Stacey [name] MRN: [id number] DOB: [birth year]   Cancelled treatment:       Reason Eval/Treat Not Completed: SLP screened, no needs identified, will sign off(chart reviewed; consulted NSG then met w/ pt/family ). Pt denied any difficulty swallowing and is currently on a regular diet; tolerates swallowing pills w/ water per NSG. Pt conversed at conversational level w/out deficits noted; pt and family denied any speech-language deficits. She has been completing the NIH screening w/ NSG appropriately per NSG. No further skilled ST services indicated as pt appears at her baseline. Pt agreed. NSG to reconsult if any change in status.     Orinda Kenner, MS, CCC-SLP Watson,Katherine 03/22/2018, 9:58 AM

## 2018-03-22 NOTE — Progress Notes (Signed)
Subjective: Patient remained stable with no new strokelike symptoms.  She however still complains of intermittent headache without associated symptoms.  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  She reports that she was able to walk with physical therapy today with minimal assistance.    Objective: Current vital signs: BP (!) 115/57 (BP Location: Left Arm)   Pulse 79   Temp 97.7 F (36.5 C) (Oral) Comment: 97.7  Resp 19   Ht 5\' 2"  (1.575 m)   Wt 74.2 kg   SpO2 97%   BMI 29.92 kg/m  Vital signs in last 24 hours: Temp:  [97.5 F (36.4 C)-98.1 F (36.7 C)] 97.7 F (36.5 C) (10/29 1003) Pulse Rate:  [73-86] 79 (10/29 1003) Resp:  [18-20] 19 (10/28 2015) BP: (102-153)/(57-93) 115/57 (10/29 1003) SpO2:  [95 %-98 %] 97 % (10/29 1003) Weight:  [72.6 kg-74.2 kg] 74.2 kg (10/28 1757)  Intake/Output from previous day: No intake/output data recorded. Intake/Output this shift: No intake/output data recorded. Nutritional status:  Diet Order            Diet - low sodium heart healthy        Diet Heart Room service appropriate? Yes; Fluid consistency: Thin  Diet effective now              Neurologic Exam:  Mental Status: Alert, oriented, thought content appropriate.  Speech fluent without evidence of aphasia.  Able to follow 3 step commands without difficulty.  Mildly dysarthric Cranial Nerves: II: Discs flat bilaterally; Left inferior visual field cut, pupils equal, round, reactive to light and accommodation III,IV, VI: ptosis not present, extra-ocular motions intact bilaterally V,VII: left facial droop, facial light touch sensation decreased on the left VIII: hearing normal bilaterally IX,X: gag reflex present XI: bilateral shoulder shrug XII: midline tongue extension Motor: Right :  Upper extremity   5/5                                      Left:     Upper extremity   5/5             Lower extremity   5/5                                                  Lower  extremity   4+/5 Tone and bulk:normal tone throughout; no atrophy noted Sensory: Pinprick and light touch decreased in the LLE Deep Tendon Reflexes: 2+ and symmetric with absent AJ;s bilaterally Plantars: Right: mute                              Left: mute Cerebellar: normal finger-to-nose, normal rapid alternating movements and normal heel-to-shin test Gait: not tested due to safety concerns  Lab Results: Basic Metabolic Panel: Recent Labs  Lab 03/21/18 1352  NA 133*  K 4.0  CL 97*  CO2 28  GLUCOSE 114*  BUN 19  CREATININE 1.01*  CALCIUM 9.1    Liver Function Tests: Recent Labs  Lab 03/21/18 1352  AST 26  ALT 17  ALKPHOS 73  BILITOT 0.7  PROT 7.0  ALBUMIN 4.2   No results for input(s): LIPASE, AMYLASE in the last 168 hours. No results for input(s):  AMMONIA in the last 168 hours.  CBC: Recent Labs  Lab 03/21/18 1352  WBC 4.5  NEUTROABS 2.9  HGB 13.8  HCT 41.8  MCV 86.5  PLT 140*    Cardiac Enzymes: Recent Labs  Lab 03/21/18 1352  TROPONINI <0.03    Lipid Panel: Recent Labs  Lab 03/22/18 0541  CHOL 144  TRIG 141  HDL 39*  CHOLHDL 3.7  VLDL 28  LDLCALC 77    CBG: Recent Labs  Lab 03/21/18 1345  GLUCAP 115*    Microbiology: Results for orders placed or performed in visit on 09/16/17  Urine Culture     Status: Abnormal   Collection Time: 09/16/17  8:33 AM  Result Value Ref Range Status   Urine Culture, Routine Final report (A)  Final   Organism ID, Bacteria Escherichia coli (A)  Final    Comment: 10,000-25,000 colony forming units per mL Cefazolin <=4 ug/mL Cefazolin with an MIC <=16 predicts susceptibility to the oral agents cefaclor, cefdinir, cefpodoxime, cefprozil, cefuroxime, cephalexin, and loracarbef when used for therapy of uncomplicated urinary tract infections due to E. coli, Klebsiella pneumoniae, and Proteus mirabilis.    Antimicrobial Susceptibility Comment  Final    Comment:       ** S = Susceptible; I =  Intermediate; R = Resistant **                    P = Positive; N = Negative             MICS are expressed in micrograms per mL    Antibiotic                 RSLT#1    RSLT#2    RSLT#3    RSLT#4 Amoxicillin/Clavulanic Acid    S Ampicillin                     R Cefepime                       S Ceftriaxone                    S Cefuroxime                     S Ciprofloxacin                  S Ertapenem                      S Gentamicin                     S Imipenem                       S Levofloxacin                   S Meropenem                      S Nitrofurantoin                 S Piperacillin/Tazobactam        S Tetracycline                   S Tobramycin                     S Trimethoprim/Sulfa  S   Microscopic Examination     Status: Abnormal   Collection Time: 09/16/17  8:33 AM  Result Value Ref Range Status   WBC, UA 6-10 (A) 0 - 5 /hpf Final   RBC, UA >30 (A) 0 - 2 /hpf Final   Epithelial Cells (non renal) None seen 0 - 10 /hpf Final   Casts None seen None seen /lpf Final   Mucus, UA Present Not Estab. Final   Bacteria, UA Few None seen/Few Final    Coagulation Studies: Recent Labs    03/21/18 1352  LABPROT 14.3  INR 1.12    Imaging: Ct Angio Head W Or Wo Contrast  Addendum Date: 03/21/2018   ADDENDUM REPORT: 03/21/2018 15:58 ADDENDUM: Study discussed by telephone with Dr. Alexis Goodell on 03/21/2018 at 15:58 . Electronically Signed   By: Genevie Ann M.D.   On: 03/21/2018 15:58   Result Date: 03/21/2018 CLINICAL DATA:  77 year old female code stroke with left face tingling injury. EXAM: CT ANGIOGRAPHY HEAD AND NECK TECHNIQUE: Multidetector CT imaging of the head and neck was performed using the standard protocol during bolus administration of intravenous contrast. Multiplanar CT image reconstructions and MIPs were obtained to evaluate the vascular anatomy. Carotid stenosis measurements (when applicable) are obtained utilizing NASCET criteria, using  the distal internal carotid diameter as the denominator. CONTRAST:  62mL OMNIPAQUE IOHEXOL 350 MG/ML SOLN COMPARISON:  Head CT without contrast 1338 hours today. Brain MRI 10/23/2017. FINDINGS: CTA NECK Skeleton: Prior sternotomy. No acute osseous abnormality identified. Upper chest: Mosaic attenuation in the upper lungs with mild septal thickening. No superior mediastinal lymphadenopathy. There is mild to moderate enlargement of the central pulmonary arteries (main pulmonary artery diameter 38 millimeters) which appear patent. Other neck: Negative. Aortic arch: Prior CABG. Calcified aortic atherosclerosis. Bovine arch configuration. Right carotid system: Tortuous brachiocephalic artery and proximal right CCA without stenosis. Bulky calcified plaque at the right ICA origin and bulb results in less than 50 % stenosis with respect to the distal vessel (series 7, image 117). Partially retropharyngeal course of the right ICA with no additional stenosis to the skull base. Left carotid system: Bovine left CCA origin with plaque but no stenosis. Tortuous proximal left CCA. Calcified plaque at the posterior right ICA bulb resulting in less than 50 % stenosis with respect to the distal vessel. Partially retropharyngeal course of the cervical right ICA. Tortuosity with additional calcified plaque just below the skull base (series 7, image 105) where the tortuosity causes a kinked appearance but otherwise no stenosis. Vertebral arteries: Tortuous proximal right subclavian artery without stenosis. Normal right vertebral artery origin. Dominant right vertebral artery is tortuous distally and patent to the skull base without stenosis. No proximal left subclavian artery stenosis despite soft and calcified plaque. The left vertebral artery is non dominant and patent at its origin. There is mild to moderate left vertebral origin and left V1 segment stenosis related the soft plaque (series 7, image 172). Enhancement gradually  declines in the left vertebral approaching the skull base, and the vessel appears occluded at the C2 level. CTA HEAD Posterior circulation: The dominant distal right vertebral artery is patent with normal right PICA origin. The vertebrobasilar junction is patent, and there is a short segment of retrograde enhancement in the distal left vertebral artery V4 segment (series 9, image 44) but 4 V4 segment enhancement otherwise. The left V3 segment appears occluded. The distal left vertebral artery flow void was abnormal on the June MRI. The basilar artery is patent  without stenosis. The left AIC scratched at the left AICA is patent and may be reconstituting the left PICA. The SCA and right PCA origins are normal. There is a fetal type left PCA origin but superimposed moderate tandem stenoses in the left P1 segment. The distal left PCA enhancement is normal. The right PCA branches are normal. The right posterior communicating artery is diminutive. Anterior circulation: Both ICA siphons are patent. 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 The left siphon is mildly ectatic. There is up to moderate calcified plaque most pronounced in the supraclinoid segment, but only mild left supraclinoid stenosis. Left ophthalmic and posterior communicating artery origins are normal. Right ICA siphon calcified plaque with mild supraclinoid stenosis. Normal right ophthalmic and posterior communicating artery origins. Patent carotid termini. Normal MCA and ACA origins. Anterior communicating artery and bilateral ACA branches are within normal limits. Left MCA M1 segment, left MCA bifurcation, and left MCA branches are within normal limits. Right MCA M1 segment, bifurcation, and right MCA branches are within normal limits. Venous sinuses: Patent on the delayed images. Anatomic variants: Bovine arch configuration. Dominant right vertebral artery. Fetal type left PCA  origin. Delayed phase: Stable gray-white matter differentiation throughout the brain. Small area of right parietal lobe encephalomalacia re-demonstrated. No abnormal enhancement identified. Review of the MIP images confirms the above findings IMPRESSION: 1. 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 There is relatively poor reconstitution of the left V4 segment from the vertebrobasilar junction. The left vertebral artery is non-dominant and there are tandem atherosclerotic stenosis of the vessel in the V1 segment. 2. Negative for emergent large vessel occlusion. 3. Positive for fusiform enlargement and tortuosity of the distal cervical Left ICA which may reflect fibromuscular dysplasia (FMD). 4. Bilateral calcified carotid atherosclerosis in the head and neck, although no hemodynamically significant stenosis. 5. Moderate tandem stenoses of the left P1 segment with normal left PCA branches. 6. Stable CT appearance of the brain since 1338 hours today. 7. Enlarged central pulmonary arteries suggesting pulmonary arterial hypertension. 8.  Aortic Atherosclerosis (ICD10-I70.0).  Prior CABG. Electronically Signed: By: Genevie Ann M.D. On: 03/21/2018 15:52   Ct Angio Neck W Or Wo Contrast  Addendum Date: 03/21/2018   ADDENDUM REPORT: 03/21/2018 15:58 ADDENDUM: Study discussed by telephone with Dr. Alexis Goodell on 03/21/2018 at 15:58 . Electronically Signed   By: Genevie Ann M.D.   On: 03/21/2018 15:58   Result Date: 03/21/2018 CLINICAL DATA:  77 year old female code stroke with left face tingling injury. EXAM: CT ANGIOGRAPHY HEAD AND NECK TECHNIQUE: Multidetector CT imaging of the head and neck was performed using the standard protocol during bolus administration of intravenous contrast. Multiplanar CT image reconstructions and MIPs were obtained to evaluate the vascular anatomy. Carotid stenosis measurements (when applicable) are obtained utilizing NASCET criteria,  using the distal internal carotid diameter as the denominator. CONTRAST:  63mL OMNIPAQUE IOHEXOL 350 MG/ML SOLN COMPARISON:  Head CT without contrast 1338 hours today. Brain MRI 10/23/2017. FINDINGS: CTA NECK Skeleton: Prior sternotomy. No acute osseous abnormality identified. Upper chest: Mosaic attenuation in the upper lungs with mild septal thickening. No superior mediastinal lymphadenopathy. There is mild to moderate enlargement of the central pulmonary arteries (main pulmonary artery diameter 38 millimeters) which appear patent. Other neck: Negative. Aortic arch: Prior CABG. Calcified aortic atherosclerosis. Bovine arch configuration. Right carotid system: Tortuous brachiocephalic artery and proximal right CCA without stenosis. Bulky  calcified plaque at the right ICA origin and bulb results in less than 50 % stenosis with respect to the distal vessel (series 7, image 117). Partially retropharyngeal course of the right ICA with no additional stenosis to the skull base. Left carotid system: Bovine left CCA origin with plaque but no stenosis. Tortuous proximal left CCA. Calcified plaque at the posterior right ICA bulb resulting in less than 50 % stenosis with respect to the distal vessel. Partially retropharyngeal course of the cervical right ICA. Tortuosity with additional calcified plaque just below the skull base (series 7, image 105) where the tortuosity causes a kinked appearance but otherwise no stenosis. Vertebral arteries: Tortuous proximal right subclavian artery without stenosis. Normal right vertebral artery origin. Dominant right vertebral artery is tortuous distally and patent to the skull base without stenosis. No proximal left subclavian artery stenosis despite soft and calcified plaque. The left vertebral artery is non dominant and patent at its origin. There is mild to moderate left vertebral origin and left V1 segment stenosis related the soft plaque (series 7, image 172). Enhancement gradually  declines in the left vertebral approaching the skull base, and the vessel appears occluded at the C2 level. CTA HEAD Posterior circulation: The dominant distal right vertebral artery is patent with normal right PICA origin. The vertebrobasilar junction is patent, and there is a short segment of retrograde enhancement in the distal left vertebral artery V4 segment (series 9, image 44) but 4 V4 segment enhancement otherwise. The left V3 segment appears occluded. The distal left vertebral artery flow void was abnormal on the June MRI. The basilar artery is patent without stenosis. The left AIC scratched at the left AICA is patent and may be reconstituting the left PICA. The SCA and right PCA origins are normal. There is a fetal type left PCA origin but superimposed moderate tandem stenoses in the left P1 segment. The distal left PCA enhancement is normal. The right PCA branches are normal. The right posterior communicating artery is diminutive. Anterior circulation: Both ICA siphons are patent. 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 The left siphon is mildly ectatic. There is up to moderate calcified plaque most pronounced in the supraclinoid segment, but only mild left supraclinoid stenosis. Left ophthalmic and posterior communicating artery origins are normal. Right ICA siphon calcified plaque with mild supraclinoid stenosis. Normal right ophthalmic and posterior communicating artery origins. Patent carotid termini. Normal MCA and ACA origins. Anterior communicating artery and bilateral ACA branches are within normal limits. Left MCA M1 segment, left MCA bifurcation, and left MCA branches are within normal limits. Right MCA M1 segment, bifurcation, and right MCA branches are within normal limits. Venous sinuses: Patent on the delayed images. Anatomic variants: Bovine arch configuration. Dominant right vertebral artery. Fetal type left PCA  origin. Delayed phase: Stable gray-white matter differentiation throughout the brain. Small area of right parietal lobe encephalomalacia re-demonstrated. No abnormal enhancement identified. Review of the MIP images confirms the above findings IMPRESSION: 1. 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 There is relatively poor reconstitution of the left V4 segment from the vertebrobasilar junction. The left vertebral artery is non-dominant and there are tandem atherosclerotic stenosis of the vessel in the V1 segment. 2. Negative for emergent large vessel occlusion. 3. Positive for fusiform enlargement and tortuosity of the distal cervical Left ICA which may reflect fibromuscular dysplasia (FMD). 4. Bilateral calcified carotid atherosclerosis in  the head and neck, although no hemodynamically significant stenosis. 5. Moderate tandem stenoses of the left P1 segment with normal left PCA branches. 6. Stable CT appearance of the brain since 1338 hours today. 7. Enlarged central pulmonary arteries suggesting pulmonary arterial hypertension. 8.  Aortic Atherosclerosis (ICD10-I70.0).  Prior CABG. Electronically Signed: By: Genevie Ann M.D. On: 03/21/2018 15:52   Mr Brain Wo Contrast  Result Date: 03/21/2018 CLINICAL DATA:  Facial tingling and facial droop for an hour. History of stroke, atrial fibrillation on Eliquis, hypertension, hyperlipidemia. EXAM: MRI HEAD WITHOUT CONTRAST TECHNIQUE: Multiplanar, multiecho pulse sequences of the brain and surrounding structures were obtained without intravenous contrast. COMPARISON:  CT angiogram head and neck March 21, 2018 and MRI head October 23, 2017. FINDINGS: INTRACRANIAL CONTENTS: No reduced diffusion to suggest acute ischemia. No susceptibility artifact to suggest hemorrhage. The ventricles and sulci are normal for patient's age. Small RIGHT mesial parietoccipital encephalomalacia. Scattered subcentimeter supratentorial white  matter FLAIR T2 hyperintensities. No suspicious parenchymal signal, masses, mass effect. No abnormal extra-axial fluid collections. No extra-axial masses. VASCULAR: Occluded LEFT vertebral artery. SKULL AND UPPER CERVICAL SPINE: No abnormal sellar expansion. No suspicious calvarial bone marrow signal. Craniocervical junction maintained. SINUSES/ORBITS: The mastoid air-cells and included paranasal sinuses are well-aerated.The included ocular globes and orbital contents are non-suspicious. Status post bilateral ocular lens implants. OTHER: None. IMPRESSION: 1. No acute intracranial process. 2. Old RIGHT parietoccipital infarct. 3. Mild chronic small vessel ischemic changes. 4. Occluded LEFT vertebral artery. Electronically Signed   By: Elon Alas M.D.   On: 03/21/2018 18:01   Ct Head Code Stroke Wo Contrast  Result Date: 03/21/2018 CLINICAL DATA:  Code stroke. Left facial tingling and facial droop. Rule out stroke. EXAM: CT HEAD WITHOUT CONTRAST TECHNIQUE: Contiguous axial images were obtained from the base of the skull through the vertex without intravenous contrast. COMPARISON:  MRI head 10/23/2017 FINDINGS: Brain: Negative for acute infarct. Negative for hemorrhage mass or edema. Chronic infarct right occipital lobe. Mild chronic microvascular ischemia in the white matter. Vascular: Negative for hyperdense vessel Skull: Negative Sinuses/Orbits: Paranasal sinuses clear. Bilateral cataract surgery. Other: None ASPECTS ([place] Stroke Program Early CT Score) - Ganglionic level infarction (caudate, lentiform nuclei, internal capsule, insula, M1-M3 cortex): 7 - Supraganglionic infarction (M4-M6 cortex): 3 Total score (0-10 with 10 being normal): 10 IMPRESSION: 1. No acute intracranial abnormality. 2. ASPECTS is 10 3. These results were called by telephone at the time of interpretation on 03/21/2018 at 1:48 pm to Dr. Jimmye Norman , who verbally acknowledged these results. Electronically Signed   By: Franchot Gallo M.D.   On: 03/21/2018 13:48    Medications:  I have reviewed the patient's current medications. Prior to Admission:  Medications Prior to Admission  Medication Sig Dispense Refill Last Dose  . acetaminophen (TYLENOL) 325 MG tablet Take 2 tablets (650 mg total) by mouth every 6 (six) hours as needed for mild pain.   unknown at unknown  . albuterol (VENTOLIN HFA) 108 (90 Base) MCG/ACT inhaler INHALE 2 PUFFS BY MOUTH EVERY 4 TO 6 HOURS AS NEEDED FOR SHORTNESS OF BREATH (Patient taking differently: Inhale 2 puffs into the lungs every 4 (four) hours as needed for wheezing or shortness of breath. ) 18 g 5 unknown at unknown  . amLODipine (NORVASC) 5 MG tablet Take 1 tablet (5 mg total) daily by mouth. 90 tablet 3 03/21/2018 at am  . apixaban (ELIQUIS) 5 MG TABS tablet Take 1 tablet (5 mg total) by mouth 2 (two) times daily.  60 tablet 2 03/21/2018 at 0730  . aspirin 81 MG tablet Take 1 tablet (81 mg total) by mouth daily. 30 tablet 2 03/21/2018 at 0730  . buPROPion (WELLBUTRIN XL) 300 MG 24 hr tablet TAKE 1 TABLET BY MOUTH EVERY DAY (Patient taking differently: Take 300 mg by mouth daily. ) 90 tablet 3 03/21/2018 at am  . capsaicin (ZOSTRIX) 0.025 % cream Apply 1 application topically as needed (nerve pain in chest).   unknown at unknown  . Cholecalciferol (VITAMIN D) 2000 units tablet Take 2,000 Units by mouth daily.   03/21/2018 at am  . Cyanocobalamin (B-12) 500 MCG TABS Take 500 mcg by mouth daily.    03/21/2018 at am  . diazepam (VALIUM) 5 MG tablet Take 1 tablet (5 mg total) by mouth every 12 (twelve) hours as needed for anxiety. (Patient taking differently: Take 5 mg by mouth daily as needed for anxiety. ) 60 tablet 1 unknown at unknown  . EPINEPHrine (EPIPEN 2-PAK) 0.3 mg/0.3 mL IJ SOAJ injection INJECT AS DIRECTED FOR SEVERE ALLERGIC REACTION (Patient taking differently: Inject 0.3 mg into the muscle as needed (for severe allergic reaction). ) 1 Device 1 unknown at unknown  .  hydrochlorothiazide (HYDRODIURIL) 12.5 MG tablet Take 12.5 mg by mouth daily.    03/21/2018 at am  . levothyroxine (SYNTHROID, LEVOTHROID) 100 MCG tablet Take 1 tablet (100 mcg total) by mouth daily. 90 tablet 3 03/21/2018 at am  . lisinopril (PRINIVIL,ZESTRIL) 40 MG tablet Take 40 mg by mouth daily.   03/21/2018 at am  . metoprolol tartrate (LOPRESSOR) 25 MG tablet Take 1 tablet (25 mg total) by mouth 2 (two) times daily. 60 tablet 11 03/21/2018 at 0730  . Multiple Vitamin (MULTI-VITAMINS) TABS Take 1 tablet by mouth daily.   03/21/2018 at am  . oxymetazoline (AFRIN) 0.05 % nasal spray Place 1 spray into both nostrils as needed for congestion.   unknown at unknown  . rosuvastatin (CRESTOR) 40 MG tablet Take 0.5 tablets (20 mg total) by mouth daily. (Patient taking differently: Take 20 mg by mouth at bedtime. ) 30 tablet 2 03/20/2018 at pm  . zolpidem (AMBIEN) 5 MG tablet Take 1 tablet (5 mg total) by mouth at bedtime as needed. for sleep 30 tablet 3 unknown at unknown  . lisinopril (PRINIVIL,ZESTRIL) 10 MG tablet Take 1 tablet (10 mg total) by mouth daily. (Patient not taking: Reported on 03/21/2018) 30 tablet 2 Not Taking at Unknown time  . traMADol (ULTRAM) 50 MG tablet Take 1 tablet (50 mg total) by mouth every 6 (six) hours as needed for moderate pain. (Patient not taking: Reported on 03/21/2018) 90 tablet 3 Not Taking at Unknown time   Scheduled: . amLODipine  5 mg Oral Daily  . apixaban  5 mg Oral BID  . aspirin EC  81 mg Oral Daily  . buPROPion  300 mg Oral Daily  . cholecalciferol  2,000 Units Oral Daily  . hydrochlorothiazide  12.5 mg Oral Daily  . levothyroxine  100 mcg Oral QAC breakfast  . lisinopril  40 mg Oral Daily  . metoprolol tartrate  25 mg Oral BID  . multivitamin with minerals  1 tablet Oral Daily  . rosuvastatin  20 mg Oral QHS  . vitamin B-12  500 mcg Oral Daily    Patient seen and examined.  Clinical course and management discussed.  Necessary edits performed.  I  agree with the above.  Assessment and plan of care developed and discussed below.   Assessment: 77 year old  female with history of stroke and A. fib on Eliquis presenting with worsening left-sided weakness and numbness improved.  Presentation concerning for recurring embolic event despite being on anticoagulation.  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 MRI brain reviewed and show no acute intracranial process, old right parieto-occipital infarct noted.  Remains on apixaban 5 mg twice daily for atrial fibrillation.  EEG normal with no focal lateralization or epileptiform discharges noted.  Hemoglobin A1c 5.6, LDL 77.  Plan 1.  Echocardiogram pending 2.  Continue Eliquis 3.  Statin with goal LDL less than 70 4.  If patient with continued episodes would consider discontinuation of Wellbutrin  This patient was staffed with Dr. Magda Paganini, Doy Mince who personally evaluated patient, reviewed documentation and agreed with assessment and plan of care as above.  Rufina Falco, DNP, FNP-BC Board certified Nurse Practitioner Neurology Department   LOS: 0 days   03/22/2018  1:23 PM  Alexis Goodell, MD Neurology [phone]  03/22/2018  2:12 PM

## 2018-03-22 NOTE — Evaluation (Signed)
Physical Therapy Evaluation Patient Details Name: Stacey [name] MRN: [id number] DOB: 20-Feb-1941 Today's Date: 03/22/2018   History of Present Illness  Patient is a 77 year old female admitted for symptoms of TIA following c/o L side facial tingling and droop.  PMH includes R parietoccipital stroke, chronic atrial fibrillation, CAD, fibromyalgia and glaucoma.  Clinical Impression  Patient is a 77 year old female who lives in a two story home with her husband.  Pt is very active and does not use an AD for mobility.  Pt performed bed mobility mod I and was able to sit at EOB without assistance.  Pt presented with L side weakness compared to R which is baseline as a result of previous CVA.  She reported no N/T but that she does still have a slight HA which started yesterday.  Pt able to stand from bedside without physical assist, appearing slightly unsteady on feet.  She was able to walk 50 ft in room with CGA and perform toilet transfer independently.  Pt experienced some gait deviations indicative of fall risk and PT discussed importance of having support from AD or caregiver when walking longer distances.  Pt is not open to use of a RW during recovery.  Balance testing revealed fait to good balance in quiet stance and mild deficits with narrow BOS.  Pt also sustained visual deficits from previous stroke which affect her mobility.  She will benefit from skilled PT with focus on strengthening, HEP, balance and fall prevention and use of AD.    Follow Up Recommendations Home health PT;Supervision for mobility/OOB    Equipment Recommendations  None recommended by PT    Recommendations for Other Services       Precautions / Restrictions Precautions Precautions: Fall Restrictions Weight Bearing Restrictions: No      Mobility  Bed Mobility Overal bed mobility: Modified Independent             General bed mobility comments: Use of bed rail  Transfers Overall transfer level: Needs  assistance Equipment used: None Transfers: Sit to/from Stand Sit to Stand: Supervision         General transfer comment: Able to stand without assistance; slow to stand and slightly unsteady on feet.  Able to perform toilet transfer independently.  Ambulation/Gait Ambulation/Gait assistance: Min assist Gait Distance (Feet): 50 Feet       Gait velocity interpretation: 1.31 - 2.62 ft/sec, indicative of limited community ambulator General Gait Details: Moderate foot clearance and step length,  R lateral deviation on two occasions which pt was able to self correct.  Pt keeps L UE on furniture to guide herself due to visual deficit.    Stairs            Wheelchair Mobility    Modified Rankin (Stroke Patients Only)       Balance Overall balance assessment: Modified Independent                             High Level Balance Comments: quiet stance: 10 sec, EC: 10 sec, EO: 10 sec, tandem: L: 5 sec, R: 10 sec             Pertinent Vitals/Pain Pain Assessment: No/denies pain(Reports mild HA which started yesterday.)    Home Living Family/patient expects to be discharged to:: Private residence Living Arrangements: Spouse/significant other Available Help at Discharge: Available PRN/intermittently;Neighbor;Family Type of Home: House Home Access: Level entry  Home Layout: Two level Home Equipment: Walker - 2 wheels      Prior Function Level of Independence: Independent         Comments: 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     Hand Dominance   Dominant Hand: Right    Extremity/Trunk Assessment   Upper Extremity Assessment Upper Extremity Assessment: Generalized weakness(LUE: 3/5, RUE: 3+/5; no new N/t noted)    Lower Extremity Assessment Lower Extremity Assessment: Overall WFL for tasks assessed(L LE: 3+/5, R LE: 4-/5; no new N/t noted.)    Cervical / Trunk Assessment Cervical / Trunk Assessment: Normal   Communication   Communication: No difficulties  Cognition Arousal/Alertness: Awake/alert Behavior During Therapy: WFL for tasks assessed/performed Overall Cognitive Status: Within Functional Limits for tasks assessed                                 General Comments: A&O x2 and follows commands consistently.      General Comments      Exercises Other Exercises Other Exercises: Discussed importance of use of AD or for supervision due for longer distance walking. x2 min Other Exercises: Education regarding management of home exercise program x2 min.   Assessment/Plan    PT Assessment Patient needs continued PT services  PT Problem List Decreased strength;Decreased balance       PT Treatment Interventions DME instruction;Therapeutic activities;Gait training;Therapeutic exercise;Patient/family education;Balance training;Functional mobility training;Neuromuscular re-education;Stair training    PT Goals (Current goals can be found in the Care Plan section)  Acute Rehab PT Goals Patient Stated Goal: To return to working at in-home daycare and to go on a cruise to the [place] which she already had planned for this week. PT Goal Formulation: With patient/family Time For Goal Achievement: 04/05/18 Potential to Achieve Goals: Good    Frequency Min 2X/week   Barriers to discharge        Co-evaluation               AM-PAC PT "6 Clicks" Daily Activity  Outcome Measure Difficulty turning over in bed (including adjusting bedclothes, sheets and blankets)?: A Little Difficulty moving from lying on back to sitting on the side of the bed? : A Little Difficulty sitting down on and standing up from a chair with arms (e.g., wheelchair, bedside commode, etc,.)?: A Little Help needed moving to and from a bed to chair (including a wheelchair)?: A Little Help needed walking in hospital room?: A Little Help needed climbing 3-5 steps with a railing? : A Little 6 Click Score:  18    End of Session Equipment Utilized During Treatment: Gait belt Activity Tolerance: Patient tolerated treatment well Patient left: in chair;with chair alarm set;with call bell/phone within reach;with family/visitor present   PT Visit Diagnosis: Unsteadiness on feet (R26.81);Muscle weakness (generalized) (M62.81)    Time: 3220-2542 PT Time Calculation (min) (ACUTE ONLY): 20 min   Charges:   PT Evaluation $PT Eval Low Complexity: 1 Low          Roxanne Gates, PT, DPT  Roxanne Gates 03/22/2018, 8:38 AM

## 2018-03-22 NOTE — Progress Notes (Signed)
OT Cancellation Note  Patient Details Name: Stacey [name] MRN: [id number] DOB: 1941/05/19   Cancelled Treatment:    Reason Eval/Treat Not Completed: Patient declined, no reason specified. Order received, chart reviewed. Upon attempt, pt with RN going over discharge paperwork. Pt politely denies OT needs. Reports she has a grandson who is an OT and other relatives who are PTs. Pt also notes she is going on a cruise in a couple days. Encouraged pt to be cautious when ambulating. Will sign off.   Jeni Salles, MPH, MS, OTR/L ascom [phone] 03/22/18, 3:19 PM

## 2018-03-22 NOTE — Procedures (Signed)
ELECTROENCEPHALOGRAM REPORT   Patient: Stacey [name]       Room #: 114A-AA EEG No. ID: [id number] Age: 77 y.o.        Sex: female Referring Physician: Manuella Ghazi Report Date:  03/22/2018        Interpreting Physician: Alexis Goodell  History: Stacey [name] is an 77 y.o. female with episode of visual changes and left sided weakness  Medications:  Norvasc, Eliquis, ASA, Wellbutrin, Vitamin D, Hydrodiuril, Synthroid, MVI, Crestor, B12  Conditions of Recording:  This is a 21 channel routine scalp EEG performed with bipolar and monopolar montages arranged in accordance to the international 10/20 system of electrode placement. One channel was dedicated to EKG recording.  The patient is in the awake and drowsy states.  Description:  The waking background activity consists of a low voltage, symmetrical, fairly well organized, 9 Hz alpha activity, seen from the parieto-occipital and posterior temporal regions.  Low voltage fast activity, poorly organized, is seen anteriorly and is at times superimposed on more posterior regions.  A mixture of theta and alpha rhythms are seen from the central and temporal regions. The patient drowses with slowing to irregular, low voltage theta and beta activity.   Stage II sleep is not obtained. No epileptiform activity is noted.   Hyperventilation was not performed.  Intermittent photic stimulation was performed but failed to illicit any change in the tracing.     IMPRESSION: Normal electroencephalogram, awake, drowsy and with activation procedures. There are no focal lateralizing or epileptiform features.   Alexis Goodell, MD Neurology [phone] 03/22/2018, 1:10 PM

## 2018-03-23 NOTE — Discharge Summary (Signed)
[facility] NAME: Stacey [name]    MR#:  [id number]  DATE OF BIRTH:  1941/04/25  DATE OF ADMISSION:  03/21/2018   ADMITTING PHYSICIAN: Bettey Costa, MD  DATE OF DISCHARGE: 03/22/2018  3:26 PM  PRIMARY CARE PHYSICIAN: [name], MD   ADMISSION DIAGNOSIS:  TIA (transient ischemic attack) [G45.9] DISCHARGE DIAGNOSIS:  Active Problems:   TIA (transient ischemic attack) TIA/CVA ruled out SECONDARY DIAGNOSIS:   Past Medical History:  Diagnosis Date  . Abnormal nuclear stress test 05/24/2017  . Arthritis   . Asthma   . Atrial fibrillation, transient ([place])   . 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 Cancer ([place])   . Complication of anesthesia   . Depression   . Family history of adverse reaction to anesthesia    most of family - PONV  . Fibromyalgia   . Glaucoma   . Headache    migraines - 1-2x/mo  . HTN (hypertension) 03/09/2007  . Hyperlipidemia LDL goal <70 03/25/1993  . Hypertension   . Motion sickness    all moving vehicles  . PONV (postoperative nausea and vomiting)   . Thyroid disease    HOSPITAL COURSE:  77 year old female with history of stroke and A. fib on Eliquis admitted with worsening left-sided weakness and numbness improved.  Presentation concerning for recurring embolic event despite being on anticoagulation.  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 MRI brain reviewed and show no acute intracranial process, old right parieto-occipital infarct noted.  Remains on apixaban 5 mg twice daily for atrial fibrillation.  EEG normal with no focal lateralization or epileptiform discharges noted.  Hemoglobin  A1c 5.6, LDL 77.  CVA ruled out. - Echocardiogram normal - Continue Eliquis - Statin with goal LDL less than 70 -  If patient with continued episodes neuro recommends discontinuation of Wellbutrin   * Chronic atrial fibrillation: Continue Eliquis  DISCHARGE CONDITIONS:  stable CONSULTS OBTAINED:  Treatment Team:  Catarina Hartshorn, MD Alexis Goodell, MD DRUG ALLERGIES:   Allergies  Allergen Reactions  . Cephalexin Anaphylaxis  . Atorvastatin   . Morphine And Related Hives  . Procaine Other (See Comments)    Chest pain Chest pain  . Shellfish Allergy Swelling    angioedema  . Statins Other (See Comments)    Muscle pain  . Tomato Other (See Comments)    (by testing) - also beans, wheat, peas (by testing) - also beans, wheat, peas  . Penicillins Hives and Rash    Has patient had a PCN reaction causing immediate rash, facial/tongue/throat swelling, SOB or lightheadedness with hypotension: Yes Has patient had a PCN reaction causing severe rash involving mucus membranes or skin necrosis: No Has patient had a PCN reaction that required hospitalization: No Has patient had a PCN reaction occurring within the last 10 years: Yes If all of the above answers are "NO", then may proceed with Cephalosporin use.   DISCHARGE MEDICATIONS:   Allergies as of 03/22/2018      Reactions   Cephalexin Anaphylaxis   Atorvastatin    Morphine And Related Hives   Procaine Other (See Comments)   Chest pain Chest pain   Shellfish Allergy Swelling  angioedema   Statins Other (See Comments)   Muscle pain   Tomato Other (See Comments)   (by testing) - also beans, wheat, peas (by testing) - also beans, wheat, peas   Penicillins Hives, Rash   Has patient had a PCN reaction causing immediate rash, facial/tongue/throat swelling, SOB or lightheadedness with hypotension: Yes Has patient had a PCN reaction causing severe rash involving mucus membranes or skin necrosis: No Has patient had a PCN  reaction that required hospitalization: No Has patient had a PCN reaction occurring within the last 10 years: Yes If all of the above answers are "NO", then may proceed with Cephalosporin use.      Medication List    TAKE these medications   acetaminophen 325 MG tablet Commonly known as:  TYLENOL Take 2 tablets (650 mg total) by mouth every 6 (six) hours as needed for mild pain.   albuterol 108 (90 Base) MCG/ACT inhaler Commonly known as:  PROVENTIL HFA;VENTOLIN HFA INHALE 2 PUFFS BY MOUTH EVERY 4 TO 6 HOURS AS NEEDED FOR SHORTNESS OF BREATH What changed:    how much to take  how to take this  when to take this  reasons to take this  additional instructions   amLODipine 5 MG tablet Commonly known as:  NORVASC Take 1 tablet (5 mg total) daily by mouth.   apixaban 5 MG Tabs tablet Commonly known as:  ELIQUIS Take 1 tablet (5 mg total) by mouth 2 (two) times daily.   aspirin 81 MG tablet Take 1 tablet (81 mg total) by mouth daily.   B-12 500 MCG Tabs Take 500 mcg by mouth daily.   buPROPion 300 MG 24 hr tablet Commonly known as:  WELLBUTRIN XL TAKE 1 TABLET BY MOUTH EVERY DAY   capsaicin 0.025 % cream Commonly known as:  ZOSTRIX Apply 1 application topically as needed (nerve pain in chest).   diazepam 5 MG tablet Commonly known as:  VALIUM Take 1 tablet (5 mg total) by mouth every 12 (twelve) hours as needed for anxiety. What changed:  when to take this   EPINEPHrine 0.3 mg/0.3 mL Soaj injection Commonly known as:  EPI-PEN INJECT AS DIRECTED FOR SEVERE ALLERGIC REACTION What changed:    how much to take  how to take this  when to take this  reasons to take this  additional instructions   hydrochlorothiazide 12.5 MG tablet Commonly known as:  HYDRODIURIL Take 12.5 mg by mouth daily.   levothyroxine 100 MCG tablet Commonly known as:  SYNTHROID, LEVOTHROID Take 1 tablet (100 mcg total) by mouth daily.   lisinopril 40 MG tablet Commonly known as:   PRINIVIL,ZESTRIL Take 40 mg by mouth daily.   lisinopril 10 MG tablet Commonly known as:  PRINIVIL,ZESTRIL Take 1 tablet (10 mg total) by mouth daily.   metoprolol tartrate 25 MG tablet Commonly known as:  LOPRESSOR Take 1 tablet (25 mg total) by mouth 2 (two) times daily.   MULTI-VITAMINS Tabs Take 1 tablet by mouth daily.   oxymetazoline 0.05 % nasal spray Commonly known as:  AFRIN Place 1 spray into both nostrils as needed for congestion.   rosuvastatin 40 MG tablet Commonly known as:  CRESTOR Take 0.5 tablets (20 mg total) by mouth daily. What changed:  when to take this   traMADol 50 MG tablet Commonly known as:  ULTRAM Take 1 tablet (50 mg total) by mouth every 6 (six) hours as needed for moderate pain.   Vitamin D 2000 units tablet Take 2,000  Units by mouth daily.   zolpidem 5 MG tablet Commonly known as:  AMBIEN Take 1 tablet (5 mg total) by mouth at bedtime as needed. for sleep        DISCHARGE INSTRUCTIONS:   DIET:  Cardiac diet DISCHARGE CONDITION:  Good ACTIVITY:  Activity as tolerated OXYGEN:  Home Oxygen: No.  Oxygen Delivery: room air DISCHARGE LOCATION:  home   If you experience worsening of your admission symptoms, develop shortness of breath, life threatening emergency, suicidal or homicidal thoughts you must seek medical attention immediately by calling 911 or calling your MD immediately  if symptoms less severe.  You Must read complete instructions/literature along with all the possible adverse reactions/side effects for all the Medicines you take and that have been prescribed to you. Take any new Medicines after you have completely understood and accpet all the possible adverse reactions/side effects.   Please note  You were cared for by a hospitalist during your hospital stay. If you have any questions about your discharge medications or the care you received while you were in the hospital after you are discharged, you can call the unit  and asked to speak with the hospitalist on call if the hospitalist that took care of you is not available. Once you are discharged, your primary care physician will handle any further medical issues. Please note that NO REFILLS for any discharge medications will be authorized once you are discharged, as it is imperative that you return to your primary care physician (or establish a relationship with a primary care physician if you do not have one) for your aftercare needs so that they can reassess your need for medications and monitor your lab values.    On the day of Discharge:  VITAL SIGNS:  Blood pressure 126/79, pulse 74, temperature 98 F (36.7 C), temperature source Oral, resp. rate 18, height 5\' 2"  (1.575 m), weight 74.2 kg, SpO2 96 %. PHYSICAL EXAMINATION:  GENERAL:  77 y.o.-year-old patient lying in the bed with no acute distress.  EYES: Pupils equal, round, reactive to light and accommodation. No scleral icterus. Extraocular muscles intact.  HEENT: Head atraumatic, normocephalic. Oropharynx and nasopharynx clear.  NECK:  Supple, no jugular venous distention. No thyroid enlargement, no tenderness.  LUNGS: Normal breath sounds bilaterally, no wheezing, rales,rhonchi or crepitation. No use of accessory muscles of respiration.  CARDIOVASCULAR: S1, S2 normal. No murmurs, rubs, or gallops.  ABDOMEN: Soft, non-tender, non-distended. Bowel sounds present. No organomegaly or mass.  EXTREMITIES: No pedal edema, cyanosis, or clubbing.  NEUROLOGIC: Cranial nerves II through XII are intact. Muscle strength 5/5 in all extremities. Sensation intact. Gait not checked.  PSYCHIATRIC: The patient is alert and oriented x 3.  SKIN: No obvious rash, lesion, or ulcer.  DATA REVIEW:   CBC Recent Labs  Lab 03/21/18 1352  WBC 4.5  HGB 13.8  HCT 41.8  PLT 140*    Chemistries  Recent Labs  Lab 03/21/18 1352  NA 133*  K 4.0  CL 97*  CO2 28  GLUCOSE 114*  BUN 19  CREATININE 1.01*  CALCIUM 9.1    AST 26  ALT 17  ALKPHOS 73  BILITOT 0.7     Follow-up Information    [name], MD. Go on 03/29/2018.   Specialty:  Family Medicine Why:  @3 :00 PM Contact information: [address] [place] [address] [phone]        Anabel Bene, MD. Go on 05/03/2018.   Specialty:  Neurology Why:  @12 :30 PM Contact information: [place] [facility] [place] [address] [phone]        Corey Skains, MD. Go on 04/25/2018.   Specialty:  Cardiology Why:  @11 :30 AM Contact information: [facility] [place] [place]  [address] [phone]             Management plans discussed with the patient, family and they are in agreement.  CODE STATUS: Prior   TOTAL TIME TAKING CARE OF THIS PATIENT: 45 minutes.    Max Sane M.D on 03/23/2018 at 8:32 PM  Between 7am to 6pm - Pager - [phone]  After 6pm go to [email or web address] - password [email or web address] [facility]  [employer] [facility]  Office  [phone]  CC: Primary care physician; [name], MD   Note: This dictation was prepared with Dragon dictation along with smaller phrase technology. Any transcriptional errors that result from this process are unintentional.

## 2018-03-24 DIAGNOSIS — R682 Dry mouth, unspecified: Secondary | ICD-10-CM | POA: Diagnosis not present

## 2018-03-24 DIAGNOSIS — K121 Other forms of stomatitis: Secondary | ICD-10-CM | POA: Diagnosis not present

## 2018-03-29 ENCOUNTER — Inpatient Hospital Stay: Payer: Medicare Other | Admitting: Family Medicine

## 2018-04-06 ENCOUNTER — Ambulatory Visit (INDEPENDENT_AMBULATORY_CARE_PROVIDER_SITE_OTHER): Payer: Medicare Other | Admitting: Family Medicine

## 2018-04-06 VITALS — BP 138/70 | HR 98 | Temp 98.1°F | Resp 16 | Wt 164.0 lb

## 2018-04-06 DIAGNOSIS — I480 Paroxysmal atrial fibrillation: Secondary | ICD-10-CM | POA: Diagnosis not present

## 2018-04-06 DIAGNOSIS — I25709 Atherosclerosis of coronary artery bypass graft(s), unspecified, with unspecified angina pectoris: Secondary | ICD-10-CM | POA: Diagnosis not present

## 2018-04-06 DIAGNOSIS — I639 Cerebral infarction, unspecified: Secondary | ICD-10-CM

## 2018-04-06 DIAGNOSIS — G459 Transient cerebral ischemic attack, unspecified: Secondary | ICD-10-CM

## 2018-04-06 DIAGNOSIS — F324 Major depressive disorder, single episode, in partial remission: Secondary | ICD-10-CM | POA: Diagnosis not present

## 2018-04-06 NOTE — Progress Notes (Signed)
Stacey [name]  MRN: [id number] DOB: 1941-02-11  Subjective:  HPI   The patient is a 77 year old female who presents for follow up of recent hospitalization.  She was admitted to [facility] from 03/21/18 to 03/22/18. The patient presented to the hospital with worsening left sided weakness and numbness.  Patient had work up for TIA/CVA and this was ruled out after appropriate testing. Patient reports that she is now felling back to her normal self.  She still has weakness on that left side.  Patient reports that the physician at the hospital felt that her neurologic symptoms may be from her Wellbutrin.  However, he did not discontinue it and recommended that if her symptoms came back or didn't resolve she should discontinue.  Patient Active Problem List   Diagnosis Date Noted  . TIA (transient ischemic attack) 03/21/2018  . CVA (cerebral vascular accident) ([place]) 10/22/2017  . Abnormal nuclear stress test 05/24/2017  . 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 PAF (paroxysmal atrial fibrillation) ([place])   . Non-STEMI (non-ST elevated myocardial infarction) ([place]) 02/20/2017  . 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 Moderate mitral insufficiency 01/11/2017  . Personal history of colonic polyps   . Benign neoplasm of ascending colon   . Benign neoplasm of descending colon   . Disequilibrium 09/16/2015  . Panic attacks 09/11/2015  . Ganglion cyst 08/16/2015  . Hypernatremia 07/04/2015  . Upper back pain 07/04/2015  . Other osteoarthritis involving multiple joints 02/06/2015  . Clinical depression 09/27/2014  . Dry mouth 09/27/2014  . Fibrositis 09/27/2014  . LBP (low back pain) 09/27/2014  . Avitaminosis D 09/27/2014  . Decreased leukocytes 09/27/2014  . Abnormal blood sugar 05/03/2009  . Insomnia 09/29/2007  . HTN (hypertension) 03/09/2007  . Adaptation reaction 01/29/2007  . Acid reflux 10/07/2006  . Menopausal and postmenopausal disorder  10/05/2006  . Headache, migraine 10/05/2006  . Arthritis, degenerative 03/23/1994  . Adult hypothyroidism 04/15/1993  . Hyperlipidemia LDL goal <70 03/25/1993    Past Medical History:  Diagnosis Date  . Abnormal nuclear stress test 05/24/2017  . Arthritis   . Asthma   . Atrial fibrillation, transient ([place])   . 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 Cancer ([place])   . Complication of anesthesia   . Depression   . Family history of adverse reaction to anesthesia    most of family - PONV  . Fibromyalgia   . Glaucoma   . Headache    migraines - 1-2x/mo  . HTN (hypertension) 03/09/2007  . Hyperlipidemia LDL goal <70 03/25/1993  . Hypertension   . Motion sickness    all moving vehicles  . PONV (postoperative nausea and vomiting)   . Thyroid disease     Social History   Socioeconomic History  . Marital status: Married    Spouse name: [name]  . Number of children: 3  . Years of education: Not on file  . Highest education level: GED or equivalent  Occupational History    Employer: [employer]  . Financial resource strain: Not hard at all  . Food insecurity:    Worry: Never true    Inability: Never true  . Transportation needs:    Medical: No    Non-medical: No  Tobacco Use  . Smoking status: Former Smoker    Last attempt to quit: 05/26/1975    Years since quitting: 42.8  .  Smokeless tobacco: Never Used  . Tobacco comment: quit 45  years ago   Substance and Sexual Activity  . Alcohol use: No  . Drug use: No  . Sexual activity: Never  Lifestyle  . Physical activity:    Days per week: Not on file    Minutes per session: Not on file  . Stress: Only a little  Relationships  . Social connections:    Talks on phone: Not on file    Gets together: Not on file    Attends religious service: Not on file    Active member of club or organization: Not on file    Attends meetings of clubs or organizations: Not on  file    Relationship status: Not on file  . Intimate partner violence:    Fear of current or ex partner: Not on file    Emotionally abused: Not on file    Physically abused: Not on file    Forced sexual activity: Not on file  Other Topics Concern  . Not on file  Social History Narrative  . Not on file    Outpatient Encounter Medications as of 04/06/2018  Medication Sig  . acetaminophen (TYLENOL) 325 MG tablet Take 2 tablets (650 mg total) by mouth every 6 (six) hours as needed for mild pain.  Marland Kitchen albuterol (VENTOLIN HFA) 108 (90 Base) MCG/ACT inhaler INHALE 2 PUFFS BY MOUTH EVERY 4 TO 6 HOURS AS NEEDED FOR SHORTNESS OF BREATH (Patient taking differently: Inhale 2 puffs into the lungs every 4 (four) hours as needed for wheezing or shortness of breath. )  . amLODipine (NORVASC) 5 MG tablet Take 1 tablet (5 mg total) daily by mouth.  Marland Kitchen apixaban (ELIQUIS) 5 MG TABS tablet Take 1 tablet (5 mg total) by mouth 2 (two) times daily.  Marland Kitchen aspirin 81 MG tablet Take 1 tablet (81 mg total) by mouth daily.  Marland Kitchen buPROPion (WELLBUTRIN XL) 300 MG 24 hr tablet TAKE 1 TABLET BY MOUTH EVERY DAY (Patient taking differently: Take 300 mg by mouth daily. )  . capsaicin (ZOSTRIX) 0.025 % cream Apply 1 application topically as needed (nerve pain in chest).  . Cholecalciferol (VITAMIN D) 2000 units tablet Take 2,000 Units by mouth daily.  . Cyanocobalamin (B-12) 500 MCG TABS Take 500 mcg by mouth daily.   . diazepam (VALIUM) 5 MG tablet Take 1 tablet (5 mg total) by mouth every 12 (twelve) hours as needed for anxiety. (Patient taking differently: Take 5 mg by mouth daily as needed for anxiety. )  . EPINEPHrine (EPIPEN 2-PAK) 0.3 mg/0.3 mL IJ SOAJ injection INJECT AS DIRECTED FOR SEVERE ALLERGIC REACTION (Patient taking differently: Inject 0.3 mg into the muscle as needed (for severe allergic reaction). )  . levothyroxine (SYNTHROID, LEVOTHROID) 100 MCG tablet Take 1 tablet (100 mcg total) by mouth daily.  Marland Kitchen lisinopril  (PRINIVIL,ZESTRIL) 40 MG tablet Take 40 mg by mouth daily.  . metoprolol tartrate (LOPRESSOR) 25 MG tablet Take 1 tablet (25 mg total) by mouth 2 (two) times daily.  . Multiple Vitamin (MULTI-VITAMINS) TABS Take 1 tablet by mouth daily.  . rosuvastatin (CRESTOR) 40 MG tablet Take 0.5 tablets (20 mg total) by mouth daily. (Patient taking differently: Take 20 mg by mouth at bedtime. )  . traMADol (ULTRAM) 50 MG tablet Take 1 tablet (50 mg total) by mouth every 6 (six) hours as needed for moderate pain.  Marland Kitchen zolpidem (AMBIEN) 5 MG tablet Take 1 tablet (5 mg total) by mouth at bedtime as needed.  for sleep  . [DISCONTINUED] lisinopril (PRINIVIL,ZESTRIL) 10 MG tablet Take 1 tablet (10 mg total) by mouth daily. (Patient taking differently: Take 40 mg by mouth daily. )  . hydrochlorothiazide (HYDRODIURIL) 12.5 MG tablet Take 12.5 mg by mouth daily.   Marland Kitchen oxymetazoline (AFRIN) 0.05 % nasal spray Place 1 spray into both nostrils as needed for congestion.   No facility-administered encounter medications on file as of 04/06/2018.     Allergies  Allergen Reactions  . Cephalexin Anaphylaxis  . Atorvastatin   . Morphine And Related Hives  . Procaine Other (See Comments)    Chest pain Chest pain  . Shellfish Allergy Swelling    angioedema  . Statins Other (See Comments)    Muscle pain  . Tomato Other (See Comments)    (by testing) - also beans, wheat, peas (by testing) - also beans, wheat, peas  . Penicillins Hives and Rash    Has patient had a PCN reaction causing immediate rash, facial/tongue/throat swelling, SOB or lightheadedness with hypotension: Yes Has patient had a PCN reaction causing severe rash involving mucus membranes or skin necrosis: No Has patient had a PCN reaction that required hospitalization: No Has patient had a PCN reaction occurring within the last 10 years: Yes If all of the above answers are "NO", then may proceed with Cephalosporin use.    Review of Systems  Constitutional:  Positive for malaise/fatigue. Negative for fever.  Eyes: Negative.   Respiratory: Positive for wheezing. Negative for cough and shortness of breath.   Cardiovascular: Positive for chest pain, palpitations and leg swelling (right leg only, chronic unchanged). Negative for orthopnea and claudication.  Gastrointestinal: Negative.   Skin: Negative.   Endo/Heme/Allergies: Negative.   Psychiatric/Behavioral: Negative.     Objective:  BP 138/70 (BP Location: Right Arm, Patient Position: Sitting, Cuff Size: Normal)   Pulse 98   Temp 98.1 F (36.7 C) (Oral)   Resp 16   Wt 164 lb (74.4 kg)   SpO2 96%   BMI 30.00 kg/m   Physical Exam  Constitutional: She is oriented to person, place, and time and well-developed, well-nourished, and in no distress.  HENT:  Head: Normocephalic and atraumatic.  Right Ear: External ear normal.  Left Ear: External ear normal.  Nose: Nose normal.  Eyes: Conjunctivae are normal. No scleral icterus.  Neck: No thyromegaly present.  Cardiovascular: Normal rate, regular rhythm and normal heart sounds.  Pulmonary/Chest: Effort normal and breath sounds normal.  Abdominal: Soft.  Musculoskeletal: She exhibits no edema.  Neurological: She is alert and oriented to person, place, and time. Gait normal. GCS score is 15.  Skin: Skin is warm and dry.  Psychiatric: Mood, memory, affect and judgment normal.    Assessment and Plan :   1. TIA (transient ischemic attack) Resolved. All risk factors controlled.  2. PAF (paroxysmal atrial fibrillation) ([place])   3. Coronary artery disease involving coronary bypass graft of native heart with angina pectoris ([place]) All risk fators treated.  4. Major depressive disorder with single episode, in partial remission ([place]) Doing well. Family with failing health. May need to stop Bupropion.   HPI, Exam and A&P Transcribed under the direction and in the presence of Miguel Aschoff, Brooke Bonito., MD. Electronically Signed: Althea Charon,  RMA I have done the exam and reviewed the chart and it is accurate to the best of my knowledge. Development worker, community has been used and  any errors in dictation or transcription are unintentional. Miguel Aschoff M.D. [facility]

## 2018-04-14 DIAGNOSIS — K121 Other forms of stomatitis: Secondary | ICD-10-CM | POA: Diagnosis not present

## 2018-04-14 DIAGNOSIS — R682 Dry mouth, unspecified: Secondary | ICD-10-CM | POA: Diagnosis not present

## 2018-04-25 DIAGNOSIS — E782 Mixed hyperlipidemia: Secondary | ICD-10-CM | POA: Diagnosis not present

## 2018-04-25 DIAGNOSIS — I10 Essential (primary) hypertension: Secondary | ICD-10-CM | POA: Diagnosis not present

## 2018-04-25 DIAGNOSIS — I25810 Atherosclerosis of coronary artery bypass graft(s) without angina pectoris: Secondary | ICD-10-CM | POA: Diagnosis not present

## 2018-04-25 DIAGNOSIS — I4819 Other persistent atrial fibrillation: Secondary | ICD-10-CM | POA: Diagnosis not present

## 2018-05-09 ENCOUNTER — Other Ambulatory Visit: Payer: Self-pay | Admitting: Family Medicine

## 2018-06-01 DIAGNOSIS — K121 Other forms of stomatitis: Secondary | ICD-10-CM | POA: Diagnosis not present

## 2018-06-08 ENCOUNTER — Ambulatory Visit (INDEPENDENT_AMBULATORY_CARE_PROVIDER_SITE_OTHER): Payer: Medicare Other | Admitting: Family Medicine

## 2018-06-08 ENCOUNTER — Encounter: Payer: Self-pay | Admitting: Family Medicine

## 2018-06-08 VITALS — BP 122/72 | HR 68 | Temp 98.0°F | Resp 16 | Ht 62.0 in | Wt 166.0 lb

## 2018-06-08 DIAGNOSIS — F324 Major depressive disorder, single episode, in partial remission: Secondary | ICD-10-CM

## 2018-06-08 DIAGNOSIS — E785 Hyperlipidemia, unspecified: Secondary | ICD-10-CM

## 2018-06-08 DIAGNOSIS — I25709 Atherosclerosis of coronary artery bypass graft(s), unspecified, with unspecified angina pectoris: Secondary | ICD-10-CM | POA: Diagnosis not present

## 2018-06-08 DIAGNOSIS — I10 Essential (primary) hypertension: Secondary | ICD-10-CM

## 2018-06-08 MED ORDER — ROSUVASTATIN CALCIUM 40 MG PO TABS: 40.0000 mg | ORAL_TABLET | Freq: Every day | ORAL | 2 refills | Status: DC

## 2018-06-08 NOTE — Progress Notes (Signed)
Patient: Stacey [name] Female    DOB: 11/24/1940   78 y.o.   MRN: [id number] Visit Date: 06/08/2018  Today's Provider: Wilhemena Durie, MD   Chief Complaint  Patient presents with  . Depression    follow up    Subjective:     HPI  Patient comes in today for a follow up. She was last seen in the office 2 months ago. She reports that she was going to try and wean off Bupropion. However, due to the holidays, patient felt that it was best to stay on the medication. Patient would like to discuss depression and whether or not to continue with medication.   Allergies  Allergen Reactions  . Cephalexin Anaphylaxis  . Atorvastatin   . Morphine And Related Hives  . Procaine Other (See Comments)    Chest pain Chest pain  . Shellfish Allergy Swelling    angioedema  . Statins Other (See Comments)    Muscle pain  . Tomato Other (See Comments)    (by testing) - also beans, wheat, peas (by testing) - also beans, wheat, peas  . Penicillins Hives and Rash    Has patient had a PCN reaction causing immediate rash, facial/tongue/throat swelling, SOB or lightheadedness with hypotension: Yes Has patient had a PCN reaction causing severe rash involving mucus membranes or skin necrosis: No Has patient had a PCN reaction that required hospitalization: No Has patient had a PCN reaction occurring within the last 10 years: Yes If all of the above answers are "NO", then may proceed with Cephalosporin use.     Current Outpatient Medications:  .  acetaminophen (TYLENOL) 325 MG tablet, Take 2 tablets (650 mg total) by mouth every 6 (six) hours as needed for mild pain., Disp: , Rfl:  .  albuterol (VENTOLIN HFA) 108 (90 Base) MCG/ACT inhaler, INHALE 2 PUFFS BY MOUTH EVERY 4 TO 6 HOURS AS NEEDED FOR SHORTNESS OF BREATH (Patient taking differently: Inhale 2 puffs into the lungs every 4 (four) hours as needed for wheezing or shortness of breath. ), Disp: 18 g, Rfl: 5 .  amLODipine (NORVASC) 5 MG  tablet, Take 1 tablet (5 mg total) daily by mouth., Disp: 90 tablet, Rfl: 3 .  apixaban (ELIQUIS) 5 MG TABS tablet, Take 1 tablet (5 mg total) by mouth 2 (two) times daily., Disp: 60 tablet, Rfl: 2 .  aspirin 81 MG tablet, Take 1 tablet (81 mg total) by mouth daily., Disp: 30 tablet, Rfl: 2 .  buPROPion (WELLBUTRIN XL) 300 MG 24 hr tablet, TAKE 1 TABLET BY MOUTH EVERY DAY (Patient taking differently: Take 300 mg by mouth daily. ), Disp: 90 tablet, Rfl: 3 .  capsaicin (ZOSTRIX) 0.025 % cream, Apply 1 application topically as needed (nerve pain in chest)., Disp: , Rfl:  .  Cholecalciferol (VITAMIN D) 2000 units tablet, Take 2,000 Units by mouth daily., Disp: , Rfl:  .  Cyanocobalamin (B-12) 500 MCG TABS, Take 500 mcg by mouth daily. , Disp: , Rfl:  .  diazepam (VALIUM) 5 MG tablet, Take 1 tablet (5 mg total) by mouth every 12 (twelve) hours as needed for anxiety. (Patient taking differently: Take 5 mg by mouth daily as needed for anxiety. ), Disp: 60 tablet, Rfl: 1 .  EPINEPHrine (EPIPEN 2-PAK) 0.3 mg/0.3 mL IJ SOAJ injection, INJECT AS DIRECTED FOR SEVERE ALLERGIC REACTION (Patient taking differently: Inject 0.3 mg into the muscle as needed (for severe allergic reaction). ), Disp: 1 Device, Rfl: 1 .  hydrochlorothiazide (HYDRODIURIL) 12.5 MG tablet, Take 12.5 mg by mouth daily. , Disp: , Rfl:  .  levothyroxine (SYNTHROID, LEVOTHROID) 100 MCG tablet, TAKE 1 TABLET BY MOUTH EVERY DAY, Disp: 90 tablet, Rfl: 3 .  lisinopril (PRINIVIL,ZESTRIL) 40 MG tablet, Take 40 mg by mouth daily., Disp: , Rfl:  .  metoprolol tartrate (LOPRESSOR) 25 MG tablet, Take 1 tablet (25 mg total) by mouth 2 (two) times daily., Disp: 60 tablet, Rfl: 11 .  Multiple Vitamin (MULTI-VITAMINS) TABS, Take 1 tablet by mouth daily., Disp: , Rfl:  .  oxymetazoline (AFRIN) 0.05 % nasal spray, Place 1 spray into both nostrils as needed for congestion., Disp: , Rfl:  .  rosuvastatin (CRESTOR) 40 MG tablet, Take 0.5 tablets (20 mg total) by  mouth daily. (Patient taking differently: Take 20 mg by mouth at bedtime. ), Disp: 30 tablet, Rfl: 2 .  traMADol (ULTRAM) 50 MG tablet, Take 1 tablet (50 mg total) by mouth every 6 (six) hours as needed for moderate pain., Disp: 90 tablet, Rfl: 3 .  zolpidem (AMBIEN) 5 MG tablet, Take 1 tablet (5 mg total) by mouth at bedtime as needed. for sleep, Disp: 30 tablet, Rfl: 3  Review of Systems  Constitutional: Negative.   Eyes: Negative.   Respiratory: Negative for cough and shortness of breath.   Cardiovascular: Negative for chest pain, palpitations and leg swelling.  Gastrointestinal: Negative.   Endocrine: Negative.   Musculoskeletal: Negative.   Allergic/Immunologic: Negative.   Neurological: Negative.   Psychiatric/Behavioral: Negative.     Social History   Tobacco Use  . Smoking status: Former Smoker    Last attempt to quit: 05/26/1975    Years since quitting: 43.0  . Smokeless tobacco: Never Used  . Tobacco comment: quit 45  years ago   Substance Use Topics  . Alcohol use: No      Objective:   BP 122/72 (BP Location: Left Arm, Patient Position: Sitting, Cuff Size: Normal)   Pulse 68   Temp 98 F (36.7 C)   Resp 16   Ht 5\' 2"  (1.575 m)   Wt 166 lb (75.3 kg)   SpO2 96%   BMI 30.36 kg/m  Vitals:   06/08/18 1329  BP: 122/72  Pulse: 68  Resp: 16  Temp: 98 F (36.7 C)  SpO2: 96%  Weight: 166 lb (75.3 kg)  Height: 5\' 2"  (1.575 m)     Physical Exam Constitutional:      Appearance: She is well-developed.  HENT:     Head: Normocephalic and atraumatic.     Right Ear: External ear normal.     Left Ear: External ear normal.     Nose: Nose normal.  Eyes:     General: No scleral icterus.    Conjunctiva/sclera: Conjunctivae normal.  Neck:     Thyroid: No thyromegaly.  Cardiovascular:     Rate and Rhythm: Normal rate and regular rhythm.     Heart sounds: Normal heart sounds.  Pulmonary:     Effort: Pulmonary effort is normal.     Breath sounds: Normal breath  sounds.  Abdominal:     Palpations: Abdomen is soft.  Skin:    General: Skin is warm and dry.  Neurological:     Mental Status: She is alert and oriented to person, place, and time.  Psychiatric:        Behavior: Behavior normal.        Thought Content: Thought content normal.        Judgment:  Judgment normal.         Assessment & Plan    1. Major depressive disorder with single episode, in partial remission ([place]) Improved.Continue meds until this summer--reassess then.  2. Essential hypertension   3. Hyperlipidemia LDL goal <70 Maximaize statin therapy. - rosuvastatin (CRESTOR) 40 MG tablet; Take 1 tablet (40 mg total) by mouth daily.  Dispense: 30 tablet; Refill: 2  4. Coronary artery disease involving coronary bypass graft of native heart with angina pectoris ([place]) All risk factors treated.    I have done the exam and reviewed the above chart and it is accurate to the best of my knowledge. Development worker, community has been used in this note in any air is in the dictation or transcription are unintentional.  Wilhemena Durie, MD  [place]

## 2018-06-08 NOTE — Patient Instructions (Signed)
Mediterranean diet

## 2018-06-12 ENCOUNTER — Other Ambulatory Visit: Payer: Self-pay | Admitting: Family Medicine

## 2018-06-13 ENCOUNTER — Other Ambulatory Visit: Payer: Self-pay | Admitting: Family Medicine

## 2018-06-13 MED ORDER — LISINOPRIL 40 MG PO TABS: 40.0000 mg | ORAL_TABLET | Freq: Every day | ORAL | 3 refills | Status: DC

## 2018-06-13 NOTE — Telephone Encounter (Signed)
[facility] faxed refill request for the following medications:  lisinopril (PRINIVIL,ZESTRIL) 40 MG tablet   Please advise.

## 2018-07-18 ENCOUNTER — Ambulatory Visit (INDEPENDENT_AMBULATORY_CARE_PROVIDER_SITE_OTHER): Payer: Medicare Other | Admitting: Obstetrics and Gynecology

## 2018-07-18 ENCOUNTER — Encounter: Payer: Self-pay | Admitting: Obstetrics and Gynecology

## 2018-07-18 VITALS — BP 132/78 | HR 79 | Ht 61.0 in | Wt 171.0 lb

## 2018-07-18 DIAGNOSIS — N895 Stricture and atresia of vagina: Secondary | ICD-10-CM | POA: Diagnosis not present

## 2018-07-18 DIAGNOSIS — N950 Postmenopausal bleeding: Secondary | ICD-10-CM

## 2018-07-18 DIAGNOSIS — I25709 Atherosclerosis of coronary artery bypass graft(s), unspecified, with unspecified angina pectoris: Secondary | ICD-10-CM | POA: Diagnosis not present

## 2018-07-18 NOTE — Progress Notes (Signed)
Gynecology H&P  Chief Complaint:  Chief Complaint  Patient presents with  . Gynecologic Exam    sporadic light spotting    History of Present Illness: Patient is a 78 y.o. G1P1 presents evaluation of postmenopausal bleeding. The patient states she has had a multiple episode(s) of bleeding in the past 3 month(s).  The most recent episode occurred  1  day(s) ago.  The bleeding has been limited to spotting . She describes the blood as pink to dark brown in appearance.  She has had no additional complaints. She deniestrauma or other inciting event, bloating, cramping, early satiety and abdominal pain. She does not have a history of abnormal pap smears.  The patient is not currently sexually active There are no other aggravating factors reported. There are no alleviating factors reported. The patient's past medical history is notable for Eliquist use.   She has had prior work up for postmenopausal bleeding.  She is status post prior abdominal hysterectomy per her past surgical history but I do not see any operative noted to confirm this.  Bleeding dates back to at least 2011. On 05/02/2010 patient underwent hysteroscopy D&C for endoemtrial polyp with benign pathology at that time by Gynecology-Oncology at [facility].  Repeat episode of bleeding 02/25/2011 endometrial biopsy benign, vaginal biopsy showing chronic vaginitis, pap smear NIL HPV negative 02/25/2012, Hysteroscopy and D&C normal 04/14/2011  Review of Systems: 10 point review of systems negative unless otherwise noted in HPI  Past Medical History:  Past Medical History:  Diagnosis Date  . Abnormal nuclear stress test 05/24/2017  . Arthritis   . Asthma   . Atrial fibrillation, transient ([place])   . 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 Cancer ([place])   . Complication of anesthesia   . Depression   . Family history of adverse reaction to anesthesia    most of family - PONV  . Fibromyalgia   .  Glaucoma   . Headache    migraines - 1-2x/mo  . HTN (hypertension) 03/09/2007  . Hyperlipidemia LDL goal <70 03/25/1993  . Hypertension   . Motion sickness    all moving vehicles  . PONV (postoperative nausea and vomiting)   . Stroke ([place]) 2019  . Thyroid disease     Past Surgical History:  Past Surgical History:  Procedure Laterality Date  . ABDOMINAL HYSTERECTOMY    . APPENDECTOMY    . BLADDER SURGERY    . CATARACT EXTRACTION W/ INTRAOCULAR LENS  IMPLANT, BILATERAL    . COLONOSCOPY WITH PROPOFOL N/A 01/17/2016   Procedure: COLONOSCOPY WITH PROPOFOL;  Surgeon: [name], MD;  Location: [place];  Service: Endoscopy;  Laterality: N/A;  . CORONARY ARTERY BYPASS GRAFT N/A 02/20/2017   Procedure: CORONARY ARTERY BYPASS GRAFTING (CABG) x , three using left internal mammary artery to left anterior descending coronary artery and right greater saphenous vein harvested endoscopically to distal right and diagonal coronary arteries.;  Surgeon: Grace Isaac, MD;  Location: [place];  Service: Open Heart Surgery;  Laterality: N/A;  . LEFT HEART CATH AND CORONARY ANGIOGRAPHY N/A 02/20/2017   Procedure: LEFT HEART CATH AND CORONARY ANGIOGRAPHY;  Surgeon: Isaias Cowman, MD;  Location: [place] [facility];  Service: Cardiovascular;  Laterality: N/A;  . LEFT HEART CATH AND CORS/GRAFTS ANGIOGRAPHY N/A 05/24/2017   Procedure: LEFT HEART CATH AND CORS/GRAFTS ANGIOGRAPHY;  Surgeon: Sherren Mocha, MD;  Location: [place] [facility];  Service: Cardiovascular;  Laterality:  N/A;  . NASAL SINUS SURGERY    . POLYPECTOMY  01/17/2016   Procedure: POLYPECTOMY;  Surgeon: [name], MD;  Location: [place];  Service: Endoscopy;;  . RIGHT OOPHORECTOMY    . TEE WITHOUT CARDIOVERSION N/A 02/20/2017   Procedure: TRANSESOPHAGEAL ECHOCARDIOGRAM (TEE);  Surgeon: Grace Isaac, MD;  Location: [place];  Service: Open Heart Surgery;  Laterality: N/A;    Family History:  Family History    Problem Relation Age of Onset  . Alzheimer's disease Mother   . Kidney cancer Mother   . Heart attack Father   . Pancreatic cancer Sister   . Heart attack Brother 68  . Ovarian cancer Sister   . Kidney disease Sister        dialysis  . Cancer Sister        uterin  . Diabetes Sister   . Heart attack Sister   . Heart attack Brother 69  . Ovarian cancer Sister     Social History:  Social History   Socioeconomic History  . Marital status: Married    Spouse name: [name]  . Number of children: 3  . Years of education: Not on file  . Highest education level: GED or equivalent  Occupational History    Employer: [employer]  . Financial resource strain: Not hard at all  . Food insecurity:    Worry: Never true    Inability: Never true  . Transportation needs:    Medical: No    Non-medical: No  Tobacco Use  . Smoking status: Former Smoker    Last attempt to quit: 05/26/1975    Years since quitting: 43.1  . Smokeless tobacco: Never Used  . Tobacco comment: quit 45  years ago   Substance and Sexual Activity  . Alcohol use: No  . Drug use: No  . Sexual activity: Not Currently  Lifestyle  . Physical activity:    Days per week: Not on file    Minutes per session: Not on file  . Stress: Only a little  Relationships  . Social connections:    Talks on phone: Not on file    Gets together: Not on file    Attends religious service: Not on file    Active member of club or organization: Not on file    Attends meetings of clubs or organizations: Not on file    Relationship status: Not on file  . Intimate partner violence:    Fear of current or ex partner: Not on file    Emotionally abused: Not on file    Physically abused: Not on file    Forced sexual activity: Not on file  Other Topics Concern  . Not on file  Social History Narrative  . Not on file    Allergies:  Allergies  Allergen Reactions  . Cephalexin Anaphylaxis  . Atorvastatin   . Morphine  And Related Hives  . Procaine Other (See Comments)    Chest pain Chest pain  . Shellfish Allergy Swelling    angioedema  . Statins Other (See Comments)    Muscle pain  . Tomato Other (See Comments)    (by testing) - also beans, wheat, peas (by testing) - also beans, wheat, peas  . Penicillins Hives and Rash    Has patient had a PCN reaction causing immediate rash, facial/tongue/throat swelling, SOB or lightheadedness with hypotension: Yes Has patient had a PCN reaction causing severe rash involving mucus membranes or skin necrosis: No  Has patient had a PCN reaction that required hospitalization: No Has patient had a PCN reaction occurring within the last 10 years: Yes If all of the above answers are "NO", then may proceed with Cephalosporin use.    Medications: Prior to Admission medications   Medication Sig Start Date End Date Taking? Authorizing Provider  acetaminophen (TYLENOL) 325 MG tablet Take 2 tablets (650 mg total) by mouth every 6 (six) hours as needed for mild pain. 02/28/17   Nani Skillern, PA-C  albuterol (VENTOLIN HFA) 108 (90 Base) MCG/ACT inhaler INHALE 2 PUFFS BY MOUTH EVERY 4 TO 6 HOURS AS NEEDED FOR SHORTNESS OF BREATH Patient taking differently: Inhale 2 puffs into the lungs every 4 (four) hours as needed for wheezing or shortness of breath.  12/17/16   Mar Daring, PA-C  amLODipine (NORVASC) 5 MG tablet Take 1 tablet (5 mg total) daily by mouth. 04/04/17   [name], MD  apixaban (ELIQUIS) 5 MG TABS tablet Take 1 tablet (5 mg total) by mouth 2 (two) times daily. 10/24/17   Gladstone Lighter, MD  aspirin 81 MG tablet Take 1 tablet (81 mg total) by mouth daily. 10/24/17   Gladstone Lighter, MD  buPROPion (WELLBUTRIN XL) 300 MG 24 hr tablet TAKE 1 TABLET BY MOUTH EVERY DAY Patient taking differently: Take 300 mg by mouth daily.  03/16/18   [name], MD  capsaicin (ZOSTRIX) 0.025 % cream Apply 1 application topically as needed  (nerve pain in chest).    [provider]  Cholecalciferol (VITAMIN D) 2000 units tablet Take 2,000 Units by mouth daily.    [provider]  Cyanocobalamin (B-12) 500 MCG TABS Take 500 mcg by mouth daily.     [provider]  diazepam (VALIUM) 5 MG tablet Take 1 tablet (5 mg total) by mouth every 12 (twelve) hours as needed for anxiety. Patient taking differently: Take 5 mg by mouth daily as needed for anxiety.  08/30/17   [name], MD  EPINEPHrine (EPIPEN 2-PAK) 0.3 mg/0.3 mL IJ SOAJ injection INJECT AS DIRECTED FOR SEVERE ALLERGIC REACTION Patient taking differently: Inject 0.3 mg into the muscle as needed (for severe allergic reaction).  12/17/16   Mar Daring, PA-C  hydrochlorothiazide (HYDRODIURIL) 12.5 MG tablet Take 12.5 mg by mouth daily.  11/01/17 11/01/18  [provider]  levothyroxine (SYNTHROID, LEVOTHROID) 100 MCG tablet TAKE 1 TABLET BY MOUTH EVERY DAY 05/09/18   [name], MD  lisinopril (PRINIVIL,ZESTRIL) 40 MG tablet TAKE 1 TABLET BY MOUTH EVERY DAY 06/13/18   [name], MD  lisinopril (PRINIVIL,ZESTRIL) 40 MG tablet Take 1 tablet (40 mg total) by mouth daily. 06/13/18   [name], MD  metoprolol tartrate (LOPRESSOR) 25 MG tablet Take 1 tablet (25 mg total) by mouth 2 (two) times daily. 05/24/17   Barrett, Evelene Croon, PA-C  Multiple Vitamin (MULTI-VITAMINS) TABS Take 1 tablet by mouth daily.    [provider]  oxymetazoline (AFRIN) 0.05 % nasal spray Place 1 spray into both nostrils as needed for congestion.    [provider]  rosuvastatin (CRESTOR) 40 MG tablet Take 1 tablet (40 mg total) by mouth daily. 06/08/18   [name], MD  traMADol (ULTRAM) 50 MG tablet Take 1 tablet (50 mg total) by mouth every 6 (six) hours as needed for moderate pain. 02/14/18   [name], MD  zolpidem (AMBIEN) 5 MG tablet Take 1  tablet (5 mg total) by mouth at bedtime as  needed. for sleep 02/14/18   [name], MD    Physical Exam Vitals: Blood pressure 132/78, pulse 79, height 5\' 1"  (1.549 m), weight 171 lb (77.6 kg).   General: NAD HEENT: normocephalic, anicteric Neck: Thyroid non-enlarged, no nodules Pulmonary: No increased work of breathing Abdomen: NABS, soft, non-tender, non-distended.  Umbilicus without lesions.  No hepatomegaly, splenomegaly or masses palpable. No evidence of hernia  Genitourinary:  External: Arophic external female genitalia.  Normal urethral meatus, normal Bartholin's and Skene's glands.  Very narrowed introitus  Vagina: Normal vaginal mucosa, no evidence of prolapse. No     Cervix: Grossly normal in appearance, no bleeding  Uterus: Non-enlarged, mobile, normal contour.  No CMT  Adnexa: ovaries non-enlarged, no adnexal masses  Rectal: deferred Extremities: no edema, erythema, or tenderness Neurologic: Grossly intact Psychiatric: mood appropriate, affect full  [place] 2012 Hysteroscopy   Assessment: 78 y.o. G1P1 presenting for evaluation of postmenopausal bleeding  Plan: Problem List Items Addressed This Visit    None    Visit Diagnoses    Postmenopausal bleeding    -  Primary   Relevant Orders   US Pelvis Complete   Vaginal stenosis       Relevant Orders   US Pelvis Complete      1) We discussed that menopause is a clinical diagnosis made after 12 months of amenorrhea.  The average age of menopause in the  [place] population is 72 but there may be significant variation.  Any bleeding that happens after a 12 month period of amenorrhea warrants further work.  Possible etiologies of postmenopausal bleeding were discussed with the patient today.  These may range from benign etiologies such as urethral prolapse and atrophy, to indeterminate lesions such as submucosal fibroids or polyps which would require resection to accurately evaluate. The role of unopposed estrogen in the development of  dndometrial  hyperplasia or carcinoma is discussed.  The risk of endometrial hyperplasia is linearly correlated with increasing BMI given the production of estrone by adipose tissue.  Work up will be include transvaginal ultrasound to assess the thickness of the endometrial lining as well as to assess for focal uterine lesions.  Negative ultrasound evaluation, defined as the absence of focal lesions and endometrial stripe of <37mm, effectively rules out carcinoma and confirms atrophy as the most likely etiology.  Should focal lesions be present these generally require hysteroscopic resection.  Should lining be greater >39mm endometrial biopsy is warranted to rule out hyperplasia or frank endometrial cancer.  Continued episodes of bleeding despite negative ultrasound also warrant endometrial sampling.  As the cervical pathology may also be implicated in postmenopausal bleeding prior cervical cytology was reviewed and repeated if required per ASCCP guidelines.   2) Evaluation by pelvc ultrasound scheduled, with follow up after [place]. EMB discussed and may be performed as well. Pros and cons of these modalities of testing discussed.   3) Return in about 1 week (around 07/25/2018) for 1-2 weeks Transabdominal US and follow up.   Malachy Mood, MD, [facility], [facility] 07/18/2018, 9:22 AM

## 2018-07-19 ENCOUNTER — Other Ambulatory Visit: Payer: Self-pay | Admitting: Family Medicine

## 2018-07-19 MED ORDER — HYDROCHLOROTHIAZIDE 12.5 MG PO TABS: 12.5000 mg | ORAL_TABLET | Freq: Every day | ORAL | 11 refills | Status: DC

## 2018-07-19 NOTE — Telephone Encounter (Signed)
[facility] faxed refill request for the following medications:  hydrochlorothiazide (HYDRODIURIL) 12.5 MG tablet   Please advise.  

## 2018-07-19 NOTE — Telephone Encounter (Signed)
Please review. Thanks!  

## 2018-08-04 ENCOUNTER — Ambulatory Visit: Payer: No Typology Code available for payment source | Admitting: Obstetrics and Gynecology

## 2018-08-04 ENCOUNTER — Other Ambulatory Visit: Payer: Self-pay

## 2018-08-04 ENCOUNTER — Ambulatory Visit: Payer: Medicare Other

## 2018-08-14 ENCOUNTER — Other Ambulatory Visit: Payer: Self-pay | Admitting: Physician Assistant

## 2018-08-14 DIAGNOSIS — J4520 Mild intermittent asthma, uncomplicated: Secondary | ICD-10-CM

## 2018-08-18 ENCOUNTER — Ambulatory Visit (INDEPENDENT_AMBULATORY_CARE_PROVIDER_SITE_OTHER): Payer: Medicare Other | Admitting: Family Medicine

## 2018-08-18 ENCOUNTER — Ambulatory Visit: Payer: Self-pay | Admitting: Family Medicine

## 2018-08-18 ENCOUNTER — Other Ambulatory Visit: Payer: Self-pay

## 2018-08-18 DIAGNOSIS — I10 Essential (primary) hypertension: Secondary | ICD-10-CM

## 2018-08-18 DIAGNOSIS — Z951 Presence of aortocoronary bypass graft: Secondary | ICD-10-CM

## 2018-08-18 NOTE — Progress Notes (Signed)
Patient: Stacey [name] Female    DOB: 01-30-1941   78 y.o.   MRN: [id number] Visit Date: 08/18/2018  Today's Provider: Wilhemena Durie, MD   No chief complaint on file.  Subjective:     This was scheduled as a virtual video visit but the patient did not have necessary computer software to be able to complete the visit.  We conducted a phone visit as she is feeling fine but anxious about covid.  She is taking all her medication but when I asked her about how she is feeling of times her blood pressure is 419 systolic and at times it is 90 systolic.  She states when it is 90 systolic she feels lightheaded and weak.  No chest pain or shortness of breath.    Virtual Visit via Video Note  I connected with Stacey [name] on 08/18/18 at  2:00 PM EDT by a video enabled telemedicine application and verified that I am speaking with the correct person using two identifiers.   I discussed the limitations of evaluation and management by telemedicine and the availability of in person appointments. The patient expressed understanding and agreed to proceed.    Allergies  Allergen Reactions  . Cephalexin Anaphylaxis  . Atorvastatin   . Morphine And Related Hives  . Procaine Other (See Comments)    Chest pain Chest pain  . Shellfish Allergy Swelling    angioedema  . Statins Other (See Comments)    Muscle pain  . Tomato Other (See Comments)    (by testing) - also beans, wheat, peas (by testing) - also beans, wheat, peas  . Penicillins Hives and Rash    Has patient had a PCN reaction causing immediate rash, facial/tongue/throat swelling, SOB or lightheadedness with hypotension: Yes Has patient had a PCN reaction causing severe rash involving mucus membranes or skin necrosis: No Has patient had a PCN reaction that required hospitalization: No Has patient had a PCN reaction occurring within the last 10 years: Yes If all of the above answers are "NO", then may proceed with  Cephalosporin use.     Current Outpatient Medications:  .  acetaminophen (TYLENOL) 325 MG tablet, Take 2 tablets (650 mg total) by mouth every 6 (six) hours as needed for mild pain., Disp: , Rfl:  .  albuterol (VENTOLIN HFA) 108 (90 Base) MCG/ACT inhaler, INHALE 2 PUFFS BY MOUTH EVERY 4 TO 6 HOURS AS NEEDED FOR SHORTNESS OF BREATH, Disp: 18 Inhaler, Rfl: 5 .  amLODipine (NORVASC) 5 MG tablet, Take 1 tablet (5 mg total) daily by mouth., Disp: 90 tablet, Rfl: 3 .  apixaban (ELIQUIS) 5 MG TABS tablet, Take 1 tablet (5 mg total) by mouth 2 (two) times daily. (Patient not taking: Reported on 07/18/2018), Disp: 60 tablet, Rfl: 2 .  aspirin 81 MG tablet, Take 1 tablet (81 mg total) by mouth daily., Disp: 30 tablet, Rfl: 2 .  buPROPion (WELLBUTRIN XL) 300 MG 24 hr tablet, TAKE 1 TABLET BY MOUTH EVERY DAY (Patient taking differently: Take 300 mg by mouth daily. ), Disp: 90 tablet, Rfl: 3 .  capsaicin (ZOSTRIX) 0.025 % cream, Apply 1 application topically as needed (nerve pain in chest)., Disp: , Rfl:  .  Cholecalciferol (VITAMIN D) 2000 units tablet, Take 2,000 Units by mouth daily., Disp: , Rfl:  .  Cyanocobalamin (B-12) 500 MCG TABS, Take 500 mcg by mouth daily. , Disp: , Rfl:  .  diazepam (VALIUM) 5 MG tablet, Take 1  tablet (5 mg total) by mouth every 12 (twelve) hours as needed for anxiety. (Patient taking differently: Take 5 mg by mouth daily as needed for anxiety. ), Disp: 60 tablet, Rfl: 1 .  EPINEPHrine (EPIPEN 2-PAK) 0.3 mg/0.3 mL IJ SOAJ injection, INJECT AS DIRECTED FOR SEVERE ALLERGIC REACTION (Patient taking differently: Inject 0.3 mg into the muscle as needed (for severe allergic reaction). ), Disp: 1 Device, Rfl: 1 .  hydrochlorothiazide (HYDRODIURIL) 12.5 MG tablet, Take 1 tablet (12.5 mg total) by mouth daily., Disp: 30 tablet, Rfl: 11 .  levothyroxine (SYNTHROID, LEVOTHROID) 100 MCG tablet, TAKE 1 TABLET BY MOUTH EVERY DAY, Disp: 90 tablet, Rfl: 3 .  lisinopril (PRINIVIL,ZESTRIL) 40 MG  tablet, TAKE 1 TABLET BY MOUTH EVERY DAY, Disp: 90 tablet, Rfl: 2 .  metoprolol tartrate (LOPRESSOR) 25 MG tablet, Take 1 tablet (25 mg total) by mouth 2 (two) times daily., Disp: 60 tablet, Rfl: 11 .  Multiple Vitamin (MULTI-VITAMINS) TABS, Take 1 tablet by mouth daily., Disp: , Rfl:  .  oxymetazoline (AFRIN) 0.05 % nasal spray, Place 1 spray into both nostrils as needed for congestion., Disp: , Rfl:  .  rosuvastatin (CRESTOR) 40 MG tablet, Take 1 tablet (40 mg total) by mouth daily., Disp: 30 tablet, Rfl: 2 .  traMADol (ULTRAM) 50 MG tablet, Take 1 tablet (50 mg total) by mouth every 6 (six) hours as needed for moderate pain. (Patient not taking: Reported on 07/18/2018), Disp: 90 tablet, Rfl: 3 .  zolpidem (AMBIEN) 5 MG tablet, Take 1 tablet (5 mg total) by mouth at bedtime as needed. for sleep, Disp: 30 tablet, Rfl: 3  Review of Systems  Social History   Tobacco Use  . Smoking status: Former Smoker    Last attempt to quit: 05/26/1975    Years since quitting: 43.2  . Smokeless tobacco: Never Used  . Tobacco comment: quit 45  years ago   Substance Use Topics  . Alcohol use: No      Objective:   There were no vitals taken for this visit. There were no vitals filed for this visit.   Physical Exam None.     1. S/P CABG x 4 All risk factors treated.  2. Essential hypertension I will see her back in 4 days time for seen in the morning.  Reassess blood pressure and obtain cardiogram then.  She has been stable.  I am concerned about the blood pressures.  Assessment & Plan    Follow Up Instructions:    I discussed the assessment and treatment plan with the patient. The patient was provided an opportunity to ask questions and all were answered. The patient agreed with the plan and demonstrated an understanding of the instructions.   The patient was advised to call back or seek an in-person evaluation if the symptoms worsen or if the condition fails to improve as anticipated.  I  provided 10 minutes of non-face-to-face time during this encounter.     I have done the exam and reviewed the above chart and it is accurate to the best of my knowledge. Development worker, community has been used in this note in any air is in the dictation or transcription are unintentional.  Wilhemena Durie, MD  [place]

## 2018-08-22 ENCOUNTER — Ambulatory Visit (INDEPENDENT_AMBULATORY_CARE_PROVIDER_SITE_OTHER): Payer: Medicare Other | Admitting: Family Medicine

## 2018-08-22 ENCOUNTER — Other Ambulatory Visit: Payer: Self-pay

## 2018-08-22 ENCOUNTER — Encounter: Payer: Self-pay | Admitting: Family Medicine

## 2018-08-22 VITALS — BP 132/84 | HR 77 | Temp 97.8°F | Ht 61.5 in | Wt 168.0 lb

## 2018-08-22 DIAGNOSIS — I25709 Atherosclerosis of coronary artery bypass graft(s), unspecified, with unspecified angina pectoris: Secondary | ICD-10-CM

## 2018-08-22 DIAGNOSIS — Z6831 Body mass index (BMI) 31.0-31.9, adult: Secondary | ICD-10-CM

## 2018-08-22 DIAGNOSIS — F324 Major depressive disorder, single episode, in partial remission: Secondary | ICD-10-CM

## 2018-08-22 DIAGNOSIS — E6609 Other obesity due to excess calories: Secondary | ICD-10-CM | POA: Diagnosis not present

## 2018-08-22 DIAGNOSIS — I480 Paroxysmal atrial fibrillation: Secondary | ICD-10-CM

## 2018-08-22 DIAGNOSIS — I10 Essential (primary) hypertension: Secondary | ICD-10-CM

## 2018-08-22 DIAGNOSIS — R079 Chest pain, unspecified: Secondary | ICD-10-CM | POA: Diagnosis not present

## 2018-08-22 MED ORDER — METOPROLOL TARTRATE 25 MG PO TABS: ORAL_TABLET | ORAL | 4 refills | Status: DC

## 2018-08-22 NOTE — Progress Notes (Signed)
Patient: Stacey [name] Female    DOB: [birth year]   78 y.o.   MRN: [id number] Visit Date: 08/22/2018  Today's Provider: Wilhemena Durie, MD   Chief Complaint  Patient presents with  . Hypertension    4 day fup   Subjective:     HPI   Hypertension, follow-up:  BP Readings from Last 3 Encounters:  08/22/18 132/84  07/18/18 132/78  06/08/18 122/72    She was last seen for hypertension 4 days ago.  BP at that visit was not taken at that time. Management since that visit includes checking Bp everyday in the morning and at night. She reports excellent compliance with treatment. She is not having side effects.  She is exercising. She is adherent to low salt diet.   Outside blood pressures are 169/101 to 90/60. She is experiencing chest pain.  Patient denies none.   Cardiovascular risk factors include hypertension.  Use of agents associated with hypertension: none.  Pt reports she took Bp at home this morning and was 130/81, pulse was 81.  Recent concerns are that blood pressures sometimes run as high as 170/90 and sometimes run as low as 90/60.  When they run low she is a little symptomatic with some fatigue.  She has left-sided chest pain that is under the left breast and goes around the chest wall in a dermatome type distribution.  It is not exertional.  Does not at all feel like the angina she had before her bypass.  She is short of breath with any significant exertion but can walk easily on flat ground.  He has trouble going up and down stairs because of the dyspnea.  It is not new.  Emotionally she seems to be okay.    Weight trend: stable Wt Readings from Last 3 Encounters:  08/22/18 168 lb (76.2 kg)  07/18/18 171 lb (77.6 kg)  06/08/18 166 lb (75.3 kg)    Current diet: low salt  ------------------------------------------------------------------------   Allergies  Allergen Reactions  . Cephalexin Anaphylaxis  . Atorvastatin   . Morphine And  Related Hives  . Procaine Other (See Comments)    Chest pain Chest pain  . Shellfish Allergy Swelling    angioedema  . Statins Other (See Comments)    Muscle pain  . Tomato Other (See Comments)    (by testing) - also beans, wheat, peas (by testing) - also beans, wheat, peas  . Penicillins Hives and Rash    Has patient had a PCN reaction causing immediate rash, facial/tongue/throat swelling, SOB or lightheadedness with hypotension: Yes Has patient had a PCN reaction causing severe rash involving mucus membranes or skin necrosis: No Has patient had a PCN reaction that required hospitalization: No Has patient had a PCN reaction occurring within the last 10 years: Yes If all of the above answers are "NO", then may proceed with Cephalosporin use.     Current Outpatient Medications:  .  acetaminophen (TYLENOL) 325 MG tablet, Take 2 tablets (650 mg total) by mouth every 6 (six) hours as needed for mild pain., Disp: , Rfl:  .  albuterol (VENTOLIN HFA) 108 (90 Base) MCG/ACT inhaler, INHALE 2 PUFFS BY MOUTH EVERY 4 TO 6 HOURS AS NEEDED FOR SHORTNESS OF BREATH, Disp: 18 Inhaler, Rfl: 5 .  amLODipine (NORVASC) 5 MG tablet, Take 1 tablet (5 mg total) daily by mouth., Disp: 90 tablet, Rfl: 3 .  apixaban (ELIQUIS) 5 MG TABS tablet, Take 1 tablet (5 mg  total) by mouth 2 (two) times daily., Disp: 60 tablet, Rfl: 2 .  aspirin 81 MG tablet, Take 1 tablet (81 mg total) by mouth daily., Disp: 30 tablet, Rfl: 2 .  buPROPion (WELLBUTRIN XL) 300 MG 24 hr tablet, TAKE 1 TABLET BY MOUTH EVERY DAY (Patient taking differently: Take 300 mg by mouth daily. ), Disp: 90 tablet, Rfl: 3 .  capsaicin (ZOSTRIX) 0.025 % cream, Apply 1 application topically as needed (nerve pain in chest)., Disp: , Rfl:  .  Cholecalciferol (VITAMIN D) 2000 units tablet, Take 2,000 Units by mouth daily., Disp: , Rfl:  .  Cyanocobalamin (B-12) 500 MCG TABS, Take 500 mcg by mouth daily. , Disp: , Rfl:  .  diazepam (VALIUM) 5 MG tablet, Take 1  tablet (5 mg total) by mouth every 12 (twelve) hours as needed for anxiety. (Patient taking differently: Take 5 mg by mouth daily as needed for anxiety. ), Disp: 60 tablet, Rfl: 1 .  EPINEPHrine (EPIPEN 2-PAK) 0.3 mg/0.3 mL IJ SOAJ injection, INJECT AS DIRECTED FOR SEVERE ALLERGIC REACTION (Patient taking differently: Inject 0.3 mg into the muscle as needed (for severe allergic reaction). ), Disp: 1 Device, Rfl: 1 .  hydrochlorothiazide (HYDRODIURIL) 12.5 MG tablet, Take 1 tablet (12.5 mg total) by mouth daily., Disp: 30 tablet, Rfl: 11 .  levothyroxine (SYNTHROID, LEVOTHROID) 100 MCG tablet, TAKE 1 TABLET BY MOUTH EVERY DAY, Disp: 90 tablet, Rfl: 3 .  lisinopril (PRINIVIL,ZESTRIL) 40 MG tablet, TAKE 1 TABLET BY MOUTH EVERY DAY, Disp: 90 tablet, Rfl: 2 .  metoprolol tartrate (LOPRESSOR) 25 MG tablet, Take 1 1/2 tablets twice a day, Disp: 270 tablet, Rfl: 4 .  Multiple Vitamin (MULTI-VITAMINS) TABS, Take 1 tablet by mouth daily., Disp: , Rfl:  .  oxymetazoline (AFRIN) 0.05 % nasal spray, Place 1 spray into both nostrils as needed for congestion., Disp: , Rfl:  .  rosuvastatin (CRESTOR) 40 MG tablet, Take 1 tablet (40 mg total) by mouth daily., Disp: 30 tablet, Rfl: 2 .  traMADol (ULTRAM) 50 MG tablet, Take 1 tablet (50 mg total) by mouth every 6 (six) hours as needed for moderate pain., Disp: 90 tablet, Rfl: 3 .  zolpidem (AMBIEN) 5 MG tablet, Take 1 tablet (5 mg total) by mouth at bedtime as needed. for sleep, Disp: 30 tablet, Rfl: 3  Review of Systems  Constitutional: Negative.   HENT: Negative.   Eyes: Negative.   Respiratory: Negative.   Cardiovascular: Negative.   Gastrointestinal: Negative.   Endocrine: Negative.   Genitourinary: Negative.   Musculoskeletal: Negative.   Skin: Negative.   Allergic/Immunologic: Negative.   Neurological: Negative.   Hematological: Negative.   Psychiatric/Behavioral: Negative.     Social History   Tobacco Use  . Smoking status: Former Smoker    Last  attempt to quit: 05/26/1975    Years since quitting: 43.2  . Smokeless tobacco: Never Used  . Tobacco comment: quit 45  years ago   Substance Use Topics  . Alcohol use: No      Objective:   BP 132/84 (BP Location: Right Arm, Patient Position: Sitting, Cuff Size: Normal)   Pulse 77   Temp 97.8 F (36.6 C) (Oral)   Ht 5' 1.5" (1.562 m)   Wt 168 lb (76.2 kg)   SpO2 97%   BMI 31.23 kg/m  Vitals:   08/22/18 0827  BP: 132/84  Pulse: 77  Temp: 97.8 F (36.6 C)  TempSrc: Oral  SpO2: 97%  Weight: 168 lb (76.2 kg)  Height:  5' 1.5" (1.562 m)     Physical Exam Vitals signs reviewed.  Constitutional:      Appearance: She is well-developed.  HENT:     Head: Normocephalic and atraumatic.     Right Ear: External ear normal.     Left Ear: External ear normal.     Nose: Nose normal.  Eyes:     General: No scleral icterus.    Conjunctiva/sclera: Conjunctivae normal.  Neck:     Thyroid: No thyromegaly.  Cardiovascular:     Rate and Rhythm: Normal rate and regular rhythm.     Heart sounds: Normal heart sounds.  Pulmonary:     Effort: Pulmonary effort is normal.     Breath sounds: Normal breath sounds.  Abdominal:     Palpations: Abdomen is soft.  Skin:    General: Skin is warm and dry.  Neurological:     Mental Status: She is alert and oriented to person, place, and time. Mental status is at baseline.  Psychiatric:        Mood and Affect: Mood normal.        Behavior: Behavior normal.        Thought Content: Thought content normal.        Judgment: Judgment normal.   ECG reveals atrial fibrillation with rate controlled.  No ST or T wave changes from previous tracings.      Assessment & Plan    1. Chest pain, unspecified type I think this appears to be musculoskeletal chest pain.  No rash or signs of shingles.  Lung sounds are clear.  Any chest x-ray if it worsens but I think this will slowly resolve. - EKG 12-Lead  2. Essential hypertension Blood pressure issues I  think might be related to her atrial fibrillation rate control.  He is rate controlled today but she states at times her heart rate gets lower fast.  Increase metoprolol 25 mg twice daily to 1-1/2 tablets twice daily.  See her back in 2 months.  3. Coronary artery disease involving coronary bypass graft of native heart with angina pectoris ([place]) All risk factors treated.  4. PAF (paroxysmal atrial fibrillation) (H[facility]) On Eliquis.  5. Major depressive disorder with single episode, in partial remission ([place]) In partial remission.  I think depression and anxiety are contributing to her symptoms.  Could benefit from counseling in the future.  She continues to work in childcare at age 10 but I think she gets great joy from the children.  6. Class 1 obesity due to excess calories with serious comorbidity and body mass index (BMI) of 31.0 to 31.9 in adult With heart disease, hypertension.    I have done the exam and reviewed the above chart and it is accurate to the best of my knowledge. Development worker, community has been used in this note in any air is in the dictation or transcription are unintentional.  Wilhemena Durie, MD  [facility]  HPI and API was scribed by under the presence of Dr Wilhemena Durie.  Edd Arbour, [place]

## 2018-09-13 ENCOUNTER — Other Ambulatory Visit: Payer: Self-pay | Admitting: Family Medicine

## 2018-09-13 DIAGNOSIS — F3342 Major depressive disorder, recurrent, in full remission: Secondary | ICD-10-CM

## 2018-09-15 ENCOUNTER — Ambulatory Visit: Payer: No Typology Code available for payment source | Admitting: Obstetrics and Gynecology

## 2018-09-15 ENCOUNTER — Ambulatory Visit: Payer: Medicare Other

## 2018-10-10 ENCOUNTER — Ambulatory Visit: Payer: Medicare Other | Admitting: Family Medicine

## 2018-10-13 ENCOUNTER — Ambulatory Visit: Payer: Self-pay | Admitting: Family Medicine

## 2018-10-19 ENCOUNTER — Other Ambulatory Visit: Payer: Self-pay | Admitting: Family Medicine

## 2018-10-19 DIAGNOSIS — E785 Hyperlipidemia, unspecified: Secondary | ICD-10-CM

## 2018-10-19 NOTE — Telephone Encounter (Signed)
Please review

## 2018-10-24 ENCOUNTER — Ambulatory Visit (INDEPENDENT_AMBULATORY_CARE_PROVIDER_SITE_OTHER): Payer: Medicare Other | Admitting: Obstetrics and Gynecology

## 2018-10-24 ENCOUNTER — Other Ambulatory Visit: Payer: Self-pay

## 2018-10-24 ENCOUNTER — Ambulatory Visit (INDEPENDENT_AMBULATORY_CARE_PROVIDER_SITE_OTHER): Payer: Medicare Other

## 2018-10-24 ENCOUNTER — Ambulatory Visit: Payer: Medicare Other | Admitting: Family Medicine

## 2018-10-24 ENCOUNTER — Encounter: Payer: Self-pay | Admitting: Obstetrics and Gynecology

## 2018-10-24 VITALS — BP 118/76 | HR 66 | Wt 172.2 lb

## 2018-10-24 DIAGNOSIS — N950 Postmenopausal bleeding: Secondary | ICD-10-CM

## 2018-10-24 DIAGNOSIS — I25709 Atherosclerosis of coronary artery bypass graft(s), unspecified, with unspecified angina pectoris: Secondary | ICD-10-CM | POA: Diagnosis not present

## 2018-10-24 DIAGNOSIS — Z1231 Encounter for screening mammogram for malignant neoplasm of breast: Secondary | ICD-10-CM

## 2018-10-24 DIAGNOSIS — N895 Stricture and atresia of vagina: Secondary | ICD-10-CM | POA: Diagnosis not present

## 2018-10-24 NOTE — Patient Instructions (Signed)
[facility] [place] [address]  [facility]  [address]. [place] [address] Phone: [phone]   Postmenopausal Bleeding  Postmenopausal bleeding is any bleeding that a woman has after she has entered into menopause. Menopause is the end of a woman's fertile years. After menopause, a woman no longer ovulates and does not have menstrual periods. Postmenopausal bleeding may have various causes, including:  Menopausal hormone therapy (MHT).  Endometrial atrophy. After menopause, low estrogen hormone levels cause the membrane that lines the uterus (endometrium) to become thinner. You may have bleeding as the endometrium thins.  Endometrial hyperplasia. This condition is caused by excess estrogen hormones and low levels of progesterone hormones. The excess estrogen causes the endometrium to thicken, which can lead to bleeding. In some cases, this can lead to cancer of the uterus.  Endometrial cancer.  Non-cancerous growths (polyps) on the endometrium, the lining of the uterus, or the cervix.  Uterine fibroids. These are non-cancerous growths in or around the uterus muscle tissue that can cause heavy bleeding. Any type of postmenopausal bleeding, even if it appears to be a typical menstrual period, should be evaluated by your health care provider. Treatment will depend on the cause of the bleeding. Follow these instructions at home:  Pay attention to any changes in your symptoms.  Avoid using tampons and douches as told by your health care provider.  Change your pads regularly.  Get regular pelvic exams and Pap tests.  Take iron supplements as told by your health care provider.  Take over-the-counter and prescription medicines only as told by your health care provider.  Keep all follow-up visits as told by your health care provider. This is important. Contact a health care provider if:  Your bleeding lasts more than 1 week.   You have abdominal pain.  You have bleeding with or after sexual intercourse.  You have bleeding that happens more often than every 3 weeks. Get help right away if:  You have a fever, chills, headache, dizziness, muscle aches, and bleeding.  You have severe pain with bleeding.  You are passing blood clots.  You have heavy bleeding, need more than 1 pad an hour, and have never experienced this before.  You feel faint. Summary  Postmenopausal bleeding is any bleeding that a woman has after she has entered into menopause.  Postmenopausal bleeding may have various causes. Treatment will depend on the cause of the bleeding.  Any type of postmenopausal bleeding, even if it appears to be a typical menstrual period, should be evaluated by your health care provider.  Be sure to pay attention to any changes in your symptoms and keep all follow-up visits as told by your health care provider. This information is not intended to replace advice given to you by your health care provider. Make sure you discuss any questions you have with your health care provider. Document Released: 08/19/2005 Document Revised: 08/04/2016 Document Reviewed: 08/04/2016 Elsevier Interactive Patient Education  2019 [employer].

## 2018-10-24 NOTE — Progress Notes (Signed)
Gynecology Ultrasound Follow Up  Chief Complaint:  Chief Complaint  Patient presents with  . Follow-up    GYN ultrasound     History of Present Illness: Patient is a 78 y.o. female who presents today for ultrasound evaluation of postmenopausal bleeding .  Ultrasound demonstrates the following findgins Adnexa: no masses seen  Uterus: Non-enlarged with endometrial stripe homogenous, 59mm, without evidence of focal lesions Additional: no free fluid  Review of Systems: Review of Systems  Constitutional: Negative.   Gastrointestinal: Negative.   Genitourinary: Negative.     Past Medical History:  Past Medical History:  Diagnosis Date  . Abnormal nuclear stress test 05/24/2017  . Arthritis   . Asthma   . Atrial fibrillation, transient ([place])   . 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 Cancer ([place])   . Complication of anesthesia   . Depression   . Family history of adverse reaction to anesthesia    most of family - PONV  . Fibromyalgia   . Glaucoma   . Headache    migraines - 1-2x/mo  . HTN (hypertension) 03/09/2007  . Hyperlipidemia LDL goal <70 03/25/1993  . Hypertension   . Motion sickness    all moving vehicles  . PONV (postoperative nausea and vomiting)   . Stroke ([place]) 2019  . Thyroid disease     Past Surgical History:  Past Surgical History:  Procedure Laterality Date  . ABDOMINAL HYSTERECTOMY    . APPENDECTOMY    . BLADDER SURGERY    . CATARACT EXTRACTION W/ INTRAOCULAR LENS  IMPLANT, BILATERAL    . COLONOSCOPY WITH PROPOFOL N/A 01/17/2016   Procedure: COLONOSCOPY WITH PROPOFOL;  Surgeon: [name], MD;  Location: [facility];  Service: Endoscopy;  Laterality: N/A;  . CORONARY ARTERY BYPASS GRAFT N/A 02/20/2017   Procedure: CORONARY ARTERY BYPASS GRAFTING (CABG) x , three using left internal mammary artery to left anterior descending coronary artery and right greater saphenous vein harvested  endoscopically to distal right and diagonal coronary arteries.;  Surgeon: Grace Isaac, MD;  Location: [facility];  Service: Open Heart Surgery;  Laterality: N/A;  . LEFT HEART CATH AND CORONARY ANGIOGRAPHY N/A 02/20/2017   Procedure: LEFT HEART CATH AND CORONARY ANGIOGRAPHY;  Surgeon: Isaias Cowman, MD;  Location: [facility];  Service: Cardiovascular;  Laterality: N/A;  . LEFT HEART CATH AND CORS/GRAFTS ANGIOGRAPHY N/A 05/24/2017   Procedure: LEFT HEART CATH AND CORS/GRAFTS ANGIOGRAPHY;  Surgeon: Sherren Mocha, MD;  Location: [facility];  Service: Cardiovascular;  Laterality: N/A;  . NASAL SINUS SURGERY    . POLYPECTOMY  01/17/2016   Procedure: POLYPECTOMY;  Surgeon: [name], MD;  Location: [facility];  Service: Endoscopy;;  . RIGHT OOPHORECTOMY    . TEE WITHOUT CARDIOVERSION N/A 02/20/2017   Procedure: TRANSESOPHAGEAL ECHOCARDIOGRAM (TEE);  Surgeon: Grace Isaac, MD;  Location: [facility];  Service: Open Heart Surgery;  Laterality: N/A;    Gynecologic History:  No LMP recorded. Patient has had a hysterectomy. Contraception: post menopausal status Last Pap N/A >65  Family History:  Family History  Problem Relation Age of Onset  . Alzheimer's disease Mother   . Kidney cancer Mother   . Heart attack Father   . Pancreatic cancer Sister   . Heart attack Brother 29  . Ovarian cancer Sister   . Kidney disease Sister        dialysis  . Cancer Sister  uterin  . Diabetes Sister   . Heart attack Sister   . Heart attack Brother 50  . Ovarian cancer Sister     Social History:  Social History   Socioeconomic History  . Marital status: Married    Spouse name: [name]  . Number of children: 3  . Years of education: Not on file  . Highest education level: GED or equivalent  Occupational History    Employer: [employer]  . Financial resource strain: Not hard at all  . Food insecurity:    Worry: Never true    Inability:  Never true  . Transportation needs:    Medical: No    Non-medical: No  Tobacco Use  . Smoking status: Former Smoker    Last attempt to quit: 05/26/1975    Years since quitting: 43.4  . Smokeless tobacco: Never Used  . Tobacco comment: quit 45  years ago   Substance and Sexual Activity  . Alcohol use: No  . Drug use: No  . Sexual activity: Not Currently  Lifestyle  . Physical activity:    Days per week: Not on file    Minutes per session: Not on file  . Stress: Only a little  Relationships  . Social connections:    Talks on phone: Not on file    Gets together: Not on file    Attends religious service: Not on file    Active member of club or organization: Not on file    Attends meetings of clubs or organizations: Not on file    Relationship status: Not on file  . Intimate partner violence:    Fear of current or ex partner: Not on file    Emotionally abused: Not on file    Physically abused: Not on file    Forced sexual activity: Not on file  Other Topics Concern  . Not on file  Social History Narrative  . Not on file    Allergies:  Allergies  Allergen Reactions  . Cephalexin Anaphylaxis  . Atorvastatin   . Morphine And Related Hives  . Procaine Other (See Comments)    Chest pain Chest pain  . Shellfish Allergy Swelling    angioedema  . Statins Other (See Comments)    Muscle pain  . Tomato Other (See Comments)    (by testing) - also beans, wheat, peas (by testing) - also beans, wheat, peas  . Penicillins Hives and Rash    Has patient had a PCN reaction causing immediate rash, facial/tongue/throat swelling, SOB or lightheadedness with hypotension: Yes Has patient had a PCN reaction causing severe rash involving mucus membranes or skin necrosis: No Has patient had a PCN reaction that required hospitalization: No Has patient had a PCN reaction occurring within the last 10 years: Yes If all of the above answers are "NO", then may proceed with Cephalosporin use.     Medications: Prior to Admission medications   Medication Sig Start Date End Date Taking? Authorizing Provider  acetaminophen (TYLENOL) 325 MG tablet Take 2 tablets (650 mg total) by mouth every 6 (six) hours as needed for mild pain. 02/28/17   Nani Skillern, PA-C  albuterol (VENTOLIN HFA) 108 (90 Base) MCG/ACT inhaler INHALE 2 PUFFS BY MOUTH EVERY 4 TO 6 HOURS AS NEEDED FOR SHORTNESS OF BREATH 08/15/18   [name], MD  amLODipine [facility]) 5 MG tablet Take 1 tablet (5 mg total) daily by mouth. 04/04/17   [name], MD  apixaban (ELIQUIS) 5 MG TABS tablet Take 1 tablet (5 mg total) by mouth 2 (two) times daily. 10/24/17   Gladstone Lighter, MD  aspirin 81 MG tablet Take 1 tablet (81 mg total) by mouth daily. 10/24/17   Gladstone Lighter, MD  buPROPion (WELLBUTRIN XL) 300 MG 24 hr tablet TAKE 1 TABLET BY MOUTH DAILY 09/13/18   [name], MD  capsaicin (ZOSTRIX) 0.025 % cream Apply 1 application topically as needed (nerve pain in chest).    [provider]  Cholecalciferol (VITAMIN D) 2000 units tablet Take 2,000 Units by mouth daily.    [provider]  Cyanocobalamin (B-12) 500 MCG TABS Take 500 mcg by mouth daily.     [provider]  diazepam (VALIUM) 5 MG tablet Take 1 tablet (5 mg total) by mouth every 12 (twelve) hours as needed for anxiety. Patient taking differently: Take 5 mg by mouth daily as needed for anxiety.  08/30/17   [name], MD  EPINEPHrine (EPIPEN 2-PAK) 0.3 mg/0.3 mL IJ SOAJ injection INJECT AS DIRECTED FOR SEVERE ALLERGIC REACTION Patient taking differently: Inject 0.3 mg into the muscle as needed (for severe allergic reaction).  12/17/16   Mar Daring, PA-C  hydrochlorothiazide (HYDRODIURIL) 12.5 MG tablet Take 1 tablet (12.5 mg total) by mouth daily. 07/19/18 07/19/19  [name], MD  levothyroxine (SYNTHROID, LEVOTHROID) 100 MCG tablet TAKE 1 TABLET BY MOUTH EVERY DAY 05/09/18    [name], MD  lisinopril (PRINIVIL,ZESTRIL) 40 MG tablet TAKE 1 TABLET BY MOUTH EVERY DAY 06/13/18   [name], MD  metoprolol tartrate (LOPRESSOR) 25 MG tablet Take 1 1/2 tablets twice a day 08/22/18   [name], MD  Multiple Vitamin (MULTI-VITAMINS) TABS Take 1 tablet by mouth daily.    [provider]  oxymetazoline (AFRIN) 0.05 % nasal spray Place 1 spray into both nostrils as needed for congestion.    [provider]  rosuvastatin (CRESTOR) 40 MG tablet TAKE 1 TABLET BY MOUTH DAILY 10/19/18   Bacigalupo, Dionne Bucy, MD  traMADol (ULTRAM) 50 MG tablet Take 1 tablet (50 mg total) by mouth every 6 (six) hours as needed for moderate pain. 02/14/18   [name], MD  zolpidem (AMBIEN) 5 MG tablet Take 1 tablet (5 mg total) by mouth at bedtime as needed. for sleep 02/14/18   [name], MD    Physical Exam Vitals: Blood pressure 118/76, pulse 66, weight 172 lb 3.2 oz (78.1 kg).  General: NAD HEENT: normocephalic, anicteric Pulmonary: No increased work of breathing Extremities: no edema, erythema, or tenderness Neurologic: Grossly intact, normal gait Psychiatric: mood appropriate, affect full   Assessment: 78 y.o. G1P1 follow up postmenopausal bleeding  Plan: Problem List Items Addressed This Visit    None    Visit Diagnoses    Postmenopausal bleeding    -  Primary   Encounter for screening mammogram for malignant neoplasm of breast       Relevant Orders   MM 3D SCREEN BREAST BILATERAL      1) Postmenopausal bleeding -  negative ultrasound evaluation today, defined as the absence of focal lesions and endometrial stripe of <46mm, which effectively rules out carcinoma and confirms atrophy as the most likely etiology for the patient's bleeding.   Continued episodes of bleeding despite negative ultrasound also warrant endometrial sampling.    2) Healthcare maintenance - mammogram ordered  3) A total of 15  minutes were spent in face-to-face contact with the patient during this encounter with over half of that time devoted to counseling and coordination of care.  4) No follow-ups on file.    Malachy Mood, MD, [facility], [facility] 10/24/2018, 8:47 AM

## 2018-10-25 ENCOUNTER — Encounter: Payer: Self-pay | Admitting: Family Medicine

## 2018-10-25 ENCOUNTER — Emergency Department: Payer: Medicare Other

## 2018-10-25 ENCOUNTER — Other Ambulatory Visit: Payer: Self-pay

## 2018-10-25 ENCOUNTER — Ambulatory Visit
Admission: RE | Admit: 2018-10-25 | Discharge: 2018-10-25 | Disposition: A | Discharge status: Home / Self Care | Payer: Medicare Other | Source: Ambulatory Visit | Attending: Family Medicine | Admitting: Family Medicine

## 2018-10-25 ENCOUNTER — Encounter: Payer: Self-pay | Admitting: Emergency Medicine

## 2018-10-25 ENCOUNTER — Ambulatory Visit (INDEPENDENT_AMBULATORY_CARE_PROVIDER_SITE_OTHER): Payer: Medicare Other | Admitting: Family Medicine

## 2018-10-25 ENCOUNTER — Observation Stay
Admission: EM | Admit: 2018-10-25 | Discharge: 2018-10-27 | Disposition: A | Discharge status: Home / Self Care | Payer: Medicare Other | Attending: Internal Medicine | Admitting: Internal Medicine

## 2018-10-25 VITALS — BP 128/68 | HR 78 | Temp 98.6°F | Resp 16 | Ht 62.0 in | Wt 170.0 lb

## 2018-10-25 DIAGNOSIS — E1151 Type 2 diabetes mellitus with diabetic peripheral angiopathy without gangrene: Secondary | ICD-10-CM | POA: Insufficient documentation

## 2018-10-25 DIAGNOSIS — Z87891 Personal history of nicotine dependence: Secondary | ICD-10-CM | POA: Diagnosis not present

## 2018-10-25 DIAGNOSIS — I480 Paroxysmal atrial fibrillation: Secondary | ICD-10-CM | POA: Diagnosis not present

## 2018-10-25 DIAGNOSIS — I2510 Atherosclerotic heart disease of native coronary artery without angina pectoris: Secondary | ICD-10-CM | POA: Insufficient documentation

## 2018-10-25 DIAGNOSIS — Z881 Allergy status to other antibiotic agents status: Secondary | ICD-10-CM | POA: Insufficient documentation

## 2018-10-25 DIAGNOSIS — R079 Chest pain, unspecified: Secondary | ICD-10-CM | POA: Diagnosis not present

## 2018-10-25 DIAGNOSIS — I10 Essential (primary) hypertension: Secondary | ICD-10-CM | POA: Diagnosis not present

## 2018-10-25 DIAGNOSIS — Z1159 Encounter for screening for other viral diseases: Secondary | ICD-10-CM | POA: Insufficient documentation

## 2018-10-25 DIAGNOSIS — Z8249 Family history of ischemic heart disease and other diseases of the circulatory system: Secondary | ICD-10-CM | POA: Diagnosis not present

## 2018-10-25 DIAGNOSIS — I25798 Atherosclerosis of other coronary artery bypass graft(s) with other forms of angina pectoris: Secondary | ICD-10-CM | POA: Diagnosis not present

## 2018-10-25 DIAGNOSIS — F329 Major depressive disorder, single episode, unspecified: Secondary | ICD-10-CM | POA: Insufficient documentation

## 2018-10-25 DIAGNOSIS — Z79899 Other long term (current) drug therapy: Secondary | ICD-10-CM | POA: Insufficient documentation

## 2018-10-25 DIAGNOSIS — M160 Bilateral primary osteoarthritis of hip: Secondary | ICD-10-CM | POA: Diagnosis not present

## 2018-10-25 DIAGNOSIS — M25551 Pain in right hip: Secondary | ICD-10-CM | POA: Insufficient documentation

## 2018-10-25 DIAGNOSIS — Z7951 Long term (current) use of inhaled steroids: Secondary | ICD-10-CM | POA: Insufficient documentation

## 2018-10-25 DIAGNOSIS — I25709 Atherosclerosis of coronary artery bypass graft(s), unspecified, with unspecified angina pectoris: Secondary | ICD-10-CM

## 2018-10-25 DIAGNOSIS — E1139 Type 2 diabetes mellitus with other diabetic ophthalmic complication: Secondary | ICD-10-CM | POA: Diagnosis not present

## 2018-10-25 DIAGNOSIS — E039 Hypothyroidism, unspecified: Secondary | ICD-10-CM | POA: Diagnosis not present

## 2018-10-25 DIAGNOSIS — Z7982 Long term (current) use of aspirin: Secondary | ICD-10-CM | POA: Diagnosis not present

## 2018-10-25 DIAGNOSIS — M545 Low back pain, unspecified: Secondary | ICD-10-CM

## 2018-10-25 DIAGNOSIS — Z951 Presence of aortocoronary bypass graft: Secondary | ICD-10-CM | POA: Diagnosis not present

## 2018-10-25 DIAGNOSIS — Z833 Family history of diabetes mellitus: Secondary | ICD-10-CM | POA: Diagnosis not present

## 2018-10-25 DIAGNOSIS — E785 Hyperlipidemia, unspecified: Secondary | ICD-10-CM

## 2018-10-25 DIAGNOSIS — J45909 Unspecified asthma, uncomplicated: Secondary | ICD-10-CM | POA: Diagnosis not present

## 2018-10-25 DIAGNOSIS — M791 Myalgia, unspecified site: Secondary | ICD-10-CM | POA: Diagnosis not present

## 2018-10-25 DIAGNOSIS — R0789 Other chest pain: Principal | ICD-10-CM | POA: Insufficient documentation

## 2018-10-25 DIAGNOSIS — Z7901 Long term (current) use of anticoagulants: Secondary | ICD-10-CM | POA: Diagnosis not present

## 2018-10-25 DIAGNOSIS — Z8673 Personal history of transient ischemic attack (TIA), and cerebral infarction without residual deficits: Secondary | ICD-10-CM | POA: Insufficient documentation

## 2018-10-25 DIAGNOSIS — Z880 Allergy status to penicillin: Secondary | ICD-10-CM | POA: Insufficient documentation

## 2018-10-25 DIAGNOSIS — Z79890 Hormone replacement therapy (postmenopausal): Secondary | ICD-10-CM | POA: Diagnosis not present

## 2018-10-25 DIAGNOSIS — H42 Glaucoma in diseases classified elsewhere: Secondary | ICD-10-CM | POA: Diagnosis not present

## 2018-10-25 DIAGNOSIS — Z20828 Contact with and (suspected) exposure to other viral communicable diseases: Secondary | ICD-10-CM | POA: Diagnosis not present

## 2018-10-25 DIAGNOSIS — Z885 Allergy status to narcotic agent status: Secondary | ICD-10-CM | POA: Insufficient documentation

## 2018-10-25 DIAGNOSIS — Z888 Allergy status to other drugs, medicaments and biological substances status: Secondary | ICD-10-CM | POA: Insufficient documentation

## 2018-10-25 DIAGNOSIS — M47816 Spondylosis without myelopathy or radiculopathy, lumbar region: Secondary | ICD-10-CM | POA: Diagnosis not present

## 2018-10-25 DIAGNOSIS — H409 Unspecified glaucoma: Secondary | ICD-10-CM | POA: Diagnosis not present

## 2018-10-25 DIAGNOSIS — R4020 Unspecified coma: Secondary | ICD-10-CM | POA: Diagnosis not present

## 2018-10-25 DIAGNOSIS — R51 Headache: Secondary | ICD-10-CM | POA: Diagnosis not present

## 2018-10-25 DIAGNOSIS — R410 Disorientation, unspecified: Secondary | ICD-10-CM | POA: Insufficient documentation

## 2018-10-25 LAB — CBC
HCT: 44.5 % (ref 36.0–46.0)
Hemoglobin: 14.7 g/dL (ref 12.0–15.0)
MCH: 29.2 pg (ref 26.0–34.0)
MCHC: 33 g/dL (ref 30.0–36.0)
MCV: 88.5 fL (ref 80.0–100.0)
Platelets: 168 10*3/uL (ref 150–400)
RBC: 5.03 MIL/uL (ref 3.87–5.11)
RDW: 12.9 % (ref 11.5–15.5)
WBC: 7.7 10*3/uL (ref 4.0–10.5)
nRBC: 0 % (ref 0.0–0.2)

## 2018-10-25 NOTE — Progress Notes (Signed)
Patient: Stacey [name] Female    DOB: [birth year]   78 y.o.   MRN: [id number] Visit Date: 10/25/2018  Today's Provider: Wilhemena Durie, MD   Chief Complaint  Patient presents with  . Follow-up  . Hypertension   Subjective:     HPI Patient comes in today for a follow up. She was last seen in the office 2 months ago. She was advised to increase Metoprolol to '25mg'$  1 1/2 tablet twice daily. She reports good compliance and good symptom control. She has not had any chest pain episodes since she was seen last.  BP Readings from Last 3 Encounters:  10/25/18 128/68  10/24/18 118/76  08/22/18 132/84   Wt Readings from Last 3 Encounters:  10/25/18 170 lb (77.1 kg)  10/24/18 172 lb 3.2 oz (78.1 kg)  08/22/18 168 lb (76.2 kg)   She also mentions that she has only been taking a half dose of rosuvastatin due to muscle pain.  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  She has chronic back pain and some right leg and hip pain from having to pick up children do that type work all day.  It is starting to bother her more.  She is still able to function.  Allergies  Allergen Reactions  . Cephalexin Anaphylaxis  . Atorvastatin   . Morphine And Related Hives  . Procaine Other (See Comments)    Chest pain Chest pain  . Shellfish Allergy Swelling    angioedema  . Statins Other (See Comments)    Muscle pain  . Tomato Other (See Comments)    (by testing) - also beans, wheat, peas (by testing) - also beans, wheat, peas  . Penicillins Hives and Rash    Has patient had a PCN reaction causing immediate rash, facial/tongue/throat swelling, SOB or lightheadedness with hypotension: Yes Has patient had a PCN reaction causing severe rash involving mucus membranes or skin necrosis: No Has patient had a PCN reaction that required hospitalization: No Has patient had a PCN reaction occurring within the last 10 years: Yes If all of the above answers are "NO", then may  proceed with Cephalosporin use.     Current Outpatient Medications:  .  albuterol (VENTOLIN HFA) 108 (90 Base) MCG/ACT inhaler, INHALE 2 PUFFS BY MOUTH EVERY 4 TO 6 HOURS AS NEEDED FOR SHORTNESS OF BREATH, Disp: 18 Inhaler, Rfl: 5 .  amLODipine (NORVASC) 5 MG tablet, Take 1 tablet (5 mg total) daily by mouth., Disp: 90 tablet, Rfl: 3 .  apixaban (ELIQUIS) 5 MG TABS tablet, Take 1 tablet (5 mg total) by mouth 2 (two) times daily., Disp: 60 tablet, Rfl: 2 .  aspirin 81 MG tablet, Take 1 tablet (81 mg total) by mouth daily., Disp: 30 tablet, Rfl: 2 .  buPROPion (WELLBUTRIN XL) 300 MG 24 hr tablet, TAKE 1 TABLET BY MOUTH DAILY, Disp: 90 tablet, Rfl: 3 .  capsaicin (ZOSTRIX) 0.025 % cream, Apply 1 application topically as needed (nerve pain in chest)., Disp: , Rfl:  .  Cholecalciferol (VITAMIN D) 2000 units tablet, Take 2,000 Units by mouth daily., Disp: , Rfl:  .  Cyanocobalamin (B-12) 500 MCG TABS, Take 500 mcg by mouth daily. , Disp: , Rfl:  .  diazepam (VALIUM) 5 MG tablet, Take 1 tablet (5 mg total) by mouth every 12 (twelve) hours as needed for anxiety., Disp: 60 tablet, Rfl: 1 .  EPINEPHrine (EPIPEN 2-PAK) 0.3 mg/0.3 mL IJ SOAJ injection,  INJECT AS DIRECTED FOR SEVERE ALLERGIC REACTION (Patient taking differently: Inject 0.3 mg into the muscle as needed (for severe allergic reaction). ), Disp: 1 Device, Rfl: 1 .  hydrochlorothiazide (HYDRODIURIL) 12.5 MG tablet, Take 1 tablet (12.5 mg total) by mouth daily., Disp: 30 tablet, Rfl: 11 .  levothyroxine (SYNTHROID, LEVOTHROID) 100 MCG tablet, TAKE 1 TABLET BY MOUTH EVERY DAY, Disp: 90 tablet, Rfl: 3 .  lisinopril (PRINIVIL,ZESTRIL) 40 MG tablet, TAKE 1 TABLET BY MOUTH EVERY DAY, Disp: 90 tablet, Rfl: 2 .  metoprolol tartrate (LOPRESSOR) 25 MG tablet, Take 1 1/2 tablets twice a day, Disp: 270 tablet, Rfl: 4 .  Multiple Vitamin (MULTI-VITAMINS) TABS, Take 1 tablet by mouth daily., Disp: , Rfl:  .  oxymetazoline (AFRIN) 0.05 % nasal spray, Place 1  spray into both nostrils as needed for congestion., Disp: , Rfl:  .  rosuvastatin (CRESTOR) 40 MG tablet, TAKE 1 TABLET BY MOUTH DAILY, Disp: 30 tablet, Rfl: 2 .  traMADol (ULTRAM) 50 MG tablet, Take 1 tablet (50 mg total) by mouth every 6 (six) hours as needed for moderate pain., Disp: 90 tablet, Rfl: 3 .  zolpidem (AMBIEN) 5 MG tablet, Take 1 tablet (5 mg total) by mouth at bedtime as needed. for sleep, Disp: 30 tablet, Rfl: 3 .  acetaminophen (TYLENOL) 325 MG tablet, Take 2 tablets (650 mg total) by mouth every 6 (six) hours as needed for mild pain. (Patient not taking: Reported on 10/24/2018), Disp: , Rfl:   Review of Systems  Constitutional: Negative for activity change, appetite change, chills, diaphoresis, fatigue, fever and unexpected weight change.  HENT: Negative.   Eyes: Negative.   Respiratory: Negative for cough and shortness of breath.   Cardiovascular: Negative for chest pain, palpitations and leg swelling.  Endocrine: Negative.   Musculoskeletal: Positive for myalgias. Negative for arthralgias.  Allergic/Immunologic: Negative.   Neurological: Negative for dizziness, light-headedness and headaches.  Psychiatric/Behavioral: Negative for agitation, self-injury, sleep disturbance and suicidal ideas. The patient is not nervous/anxious.     Social History   Tobacco Use  . Smoking status: Former Smoker    Last attempt to quit: 05/26/1975    Years since quitting: 43.4  . Smokeless tobacco: Never Used  . Tobacco comment: quit 45  years ago   Substance Use Topics  . Alcohol use: No      Objective:   BP 128/68 (BP Location: Left Arm, Patient Position: Sitting, Cuff Size: Normal)   Pulse 78   Temp 98.6 F (37 C)   Resp 16   Ht '5\' 2"'$  (1.575 m)   Wt 170 lb (77.1 kg)   SpO2 98%   BMI 31.09 kg/m  Vitals:   10/25/18 0844  BP: 128/68  Pulse: 78  Resp: 16  Temp: 98.6 F (37 C)  SpO2: 98%  Weight: 170 lb (77.1 kg)  Height: '5\' 2"'$  (1.575 m)     Physical Exam Vitals  signs reviewed.  Constitutional:      Appearance: She is well-developed.  HENT:     Head: Normocephalic and atraumatic.     Right Ear: External ear normal.     Left Ear: External ear normal.     Nose: Nose normal.  Eyes:     General: No scleral icterus.    Conjunctiva/sclera: Conjunctivae normal.  Neck:     Thyroid: No thyromegaly.  Cardiovascular:     Rate and Rhythm: Normal rate and regular rhythm.     Heart sounds: Normal heart sounds.  Pulmonary:  Effort: Pulmonary effort is normal.     Breath sounds: Normal breath sounds.  Abdominal:     Palpations: Abdomen is soft.  Skin:    General: Skin is warm and dry.  Neurological:     Mental Status: She is alert and oriented to person, place, and time. Mental status is at baseline.  Psychiatric:        Mood and Affect: Mood normal.        Behavior: Behavior normal.        Thought Content: Thought content normal.        Judgment: Judgment normal.         Assessment & Plan    1. Coronary artery disease involving coronary bypass graft of native heart with angina pectoris ([place]) Stable today.  All risk factors treated  2. Hyperlipidemia LDL goal <70 On rosuvastatin - Lipid panel  3. Myalgia  - CK - Sed Rate (ESR)  4. Essential hypertension  - Comprehensive metabolic panel  5. Low back pain, unspecified back pain laterality, unspecified chronicity, unspecified whether sciatica present Musculoskeletal - DG Lumbar Spine Complete; Future  6. Pain of right hip joint Check x-ray for possible arthritic changes - DG HIP UNILAT WITH PELVIS 2-3 VIEWS RIGHT; Future     Damyia Strider Cranford Mon, MD  [facility]

## 2018-10-25 NOTE — ED Triage Notes (Addendum)
Pt to triage via w/c with no distress noted;pt reports dull pain beneath left breast since 1min PTA with no accomp symptoms; denies hx of same but reports hx afib

## 2018-10-26 DIAGNOSIS — I4891 Unspecified atrial fibrillation: Secondary | ICD-10-CM | POA: Diagnosis not present

## 2018-10-26 DIAGNOSIS — I10 Essential (primary) hypertension: Secondary | ICD-10-CM | POA: Diagnosis not present

## 2018-10-26 DIAGNOSIS — R4020 Unspecified coma: Secondary | ICD-10-CM | POA: Diagnosis not present

## 2018-10-26 DIAGNOSIS — R079 Chest pain, unspecified: Secondary | ICD-10-CM | POA: Diagnosis present

## 2018-10-26 DIAGNOSIS — R51 Headache: Secondary | ICD-10-CM | POA: Diagnosis not present

## 2018-10-26 DIAGNOSIS — I2510 Atherosclerotic heart disease of native coronary artery without angina pectoris: Secondary | ICD-10-CM | POA: Diagnosis not present

## 2018-10-26 DIAGNOSIS — R0789 Other chest pain: Secondary | ICD-10-CM | POA: Diagnosis not present

## 2018-10-26 LAB — SARS CORONAVIRUS 2 BY RT PCR (HOSPITAL ORDER, PERFORMED IN ~~LOC~~ HOSPITAL LAB): SARS Coronavirus 2: NEGATIVE

## 2018-10-26 LAB — BASIC METABOLIC PANEL
Anion gap: 9 (ref 5–15)
BUN: 23 mg/dL (ref 8–23)
CO2: 28 mmol/L (ref 22–32)
Calcium: 9.3 mg/dL (ref 8.9–10.3)
Chloride: 95 mmol/L — ABNORMAL LOW (ref 98–111)
Creatinine, Ser: 0.92 mg/dL (ref 0.44–1.00)
GFR calc Af Amer: >60 mL/min (ref >60)
GFR calc non Af Amer: >60 mL/min (ref >60)
Glucose, Bld: 125 mg/dL — ABNORMAL HIGH (ref 70–99)
Potassium: 4.2 mmol/L (ref 3.5–5.1)
Sodium: 132 mmol/L — ABNORMAL LOW (ref 135–145)

## 2018-10-26 LAB — TROPONIN I
Troponin I: <0.03 ng/mL (ref <0.03)
Troponin I: <0.03 ng/mL (ref <0.03)
Troponin I: <0.03 ng/mL (ref <0.03)
Troponin I: <0.03 ng/mL (ref <0.03)

## 2018-10-26 LAB — HEMOGLOBIN A1C
Hgb A1c MFr Bld: 5.5 % (ref 4.8–5.6)
Mean Plasma Glucose: 111.15 mg/dL

## 2018-10-26 LAB — TSH: TSH: 0.595 u[IU]/mL (ref 0.350–4.500)

## 2018-10-26 MED ORDER — VITAMIN D3 25 MCG (1000 UNIT) PO TABS
2000.0000 [IU] | ORAL_TABLET | Freq: Every day | ORAL | Status: DC
  Administered 2018-10-26 – 2018-10-27 (×2): 2000 [IU] via ORAL
  Filled 2018-10-26 (×3): qty 2

## 2018-10-26 MED ORDER — ASPIRIN 81 MG PO CHEW
324.0000 mg | CHEWABLE_TABLET | Freq: Once | ORAL | Status: AC
  Administered 2018-10-26: 324 mg via ORAL
  Filled 2018-10-26: qty 4

## 2018-10-26 MED ORDER — LISINOPRIL 20 MG PO TABS
40.0000 mg | ORAL_TABLET | Freq: Every day | ORAL | Status: DC
  Administered 2018-10-26 – 2018-10-27 (×2): 40 mg via ORAL
  Filled 2018-10-26: qty 2
  Filled 2018-10-26: qty 4

## 2018-10-26 MED ORDER — DIAZEPAM 5 MG PO TABS: 5.0000 mg | ORAL_TABLET | Freq: Two times a day (BID) | ORAL | Status: DC | PRN

## 2018-10-26 MED ORDER — NITROGLYCERIN 2 % TD OINT
0.5000 [in_us] | TOPICAL_OINTMENT | Freq: Four times a day (QID) | TRANSDERMAL | Status: DC
  Administered 2018-10-26 (×3): 0.5 [in_us] via TOPICAL
  Filled 2018-10-26 (×3): qty 1

## 2018-10-26 MED ORDER — HYDROCHLOROTHIAZIDE 25 MG PO TABS
12.5000 mg | ORAL_TABLET | Freq: Every day | ORAL | Status: DC
  Administered 2018-10-26 – 2018-10-27 (×2): 12.5 mg via ORAL
  Filled 2018-10-26 (×2): qty 1

## 2018-10-26 MED ORDER — ONDANSETRON HCL 4 MG/2ML IJ SOLN
4.0000 mg | Freq: Once | INTRAMUSCULAR | Status: AC
  Administered 2018-10-26: 4 mg via INTRAVENOUS
  Filled 2018-10-26: qty 2

## 2018-10-26 MED ORDER — GARLIC 500 MG PO TABS: 500.0000 mg | ORAL_TABLET | Freq: Every day | ORAL | Status: DC

## 2018-10-26 MED ORDER — SODIUM CHLORIDE 0.9% FLUSH
3.0000 mL | Freq: Two times a day (BID) | INTRAVENOUS | Status: DC
  Administered 2018-10-26 – 2018-10-27 (×2): 3 mL via INTRAVENOUS

## 2018-10-26 MED ORDER — METOPROLOL TARTRATE 25 MG PO TABS
37.5000 mg | ORAL_TABLET | Freq: Two times a day (BID) | ORAL | Status: DC
  Administered 2018-10-26 – 2018-10-27 (×3): 37.5 mg via ORAL
  Filled 2018-10-26 (×3): qty 2

## 2018-10-26 MED ORDER — ROSUVASTATIN CALCIUM 10 MG PO TABS
40.0000 mg | ORAL_TABLET | Freq: Every day | ORAL | Status: DC
  Administered 2018-10-26 – 2018-10-27 (×2): 40 mg via ORAL
  Filled 2018-10-26: qty 8
  Filled 2018-10-26: qty 2
  Filled 2018-10-26: qty 4

## 2018-10-26 MED ORDER — APIXABAN 5 MG PO TABS
5.0000 mg | ORAL_TABLET | Freq: Two times a day (BID) | ORAL | Status: DC
  Administered 2018-10-26 – 2018-10-27 (×3): 5 mg via ORAL
  Filled 2018-10-26 (×3): qty 1

## 2018-10-26 MED ORDER — CYANOCOBALAMIN 500 MCG PO TABS
500.0000 ug | ORAL_TABLET | Freq: Every day | ORAL | Status: DC
  Administered 2018-10-27: 500 ug via ORAL
  Filled 2018-10-26 (×2): qty 1

## 2018-10-26 MED ORDER — ACETAMINOPHEN 325 MG PO TABS: 650.0000 mg | ORAL_TABLET | Freq: Four times a day (QID) | ORAL | Status: DC | PRN

## 2018-10-26 MED ORDER — BUPROPION HCL ER (XL) 150 MG PO TB24
300.0000 mg | ORAL_TABLET | Freq: Every day | ORAL | Status: DC
  Administered 2018-10-26 – 2018-10-27 (×2): 300 mg via ORAL
  Filled 2018-10-26 (×2): qty 2

## 2018-10-26 MED ORDER — ASPIRIN EC 81 MG PO TBEC
81.0000 mg | DELAYED_RELEASE_TABLET | Freq: Every day | ORAL | Status: DC
  Administered 2018-10-26 – 2018-10-27 (×2): 81 mg via ORAL
  Filled 2018-10-26 (×2): qty 1

## 2018-10-26 MED ORDER — ADULT MULTIVITAMIN W/MINERALS CH
1.0000 | ORAL_TABLET | Freq: Every day | ORAL | Status: DC
  Administered 2018-10-26 – 2018-10-27 (×2): 1 via ORAL
  Filled 2018-10-26 (×2): qty 1

## 2018-10-26 MED ORDER — AMLODIPINE BESYLATE 5 MG PO TABS
5.0000 mg | ORAL_TABLET | Freq: Every day | ORAL | Status: DC
  Administered 2018-10-26 – 2018-10-27 (×2): 5 mg via ORAL
  Filled 2018-10-26 (×2): qty 1

## 2018-10-26 MED ORDER — ACETAMINOPHEN 650 MG RE SUPP: 650.0000 mg | Freq: Four times a day (QID) | RECTAL | Status: DC | PRN

## 2018-10-26 MED ORDER — ZOLPIDEM TARTRATE 5 MG PO TABS
2.5000 mg | ORAL_TABLET | Freq: Every evening | ORAL | Status: DC | PRN
  Filled 2018-10-26 (×2): qty 1

## 2018-10-26 MED ORDER — DOCUSATE SODIUM 100 MG PO CAPS
100.0000 mg | ORAL_CAPSULE | Freq: Two times a day (BID) | ORAL | Status: DC
  Administered 2018-10-26 – 2018-10-27 (×3): 100 mg via ORAL
  Filled 2018-10-26 (×3): qty 1

## 2018-10-26 MED ORDER — VITAMIN C 500 MG PO TABS
500.0000 mg | ORAL_TABLET | Freq: Every day | ORAL | Status: DC
  Administered 2018-10-26 – 2018-10-27 (×2): 500 mg via ORAL
  Filled 2018-10-26 (×3): qty 1

## 2018-10-26 MED ORDER — LEVOTHYROXINE SODIUM 100 MCG PO TABS
100.0000 ug | ORAL_TABLET | Freq: Every day | ORAL | Status: DC
  Administered 2018-10-26 – 2018-10-27 (×2): 100 ug via ORAL
  Filled 2018-10-26: qty 1
  Filled 2018-10-26: qty 2

## 2018-10-26 MED ORDER — ALBUTEROL SULFATE (2.5 MG/3ML) 0.083% IN NEBU: 2.5000 mg | INHALATION_SOLUTION | RESPIRATORY_TRACT | Status: DC | PRN

## 2018-10-26 MED ORDER — ASPIRIN 300 MG RE SUPP: 300.0000 mg | Freq: Every day | RECTAL | Status: DC

## 2018-10-26 NOTE — ED Notes (Signed)
Patient's nitro paste removed d/t systolic BP of 098. Ok'd by Dr. Owens Shark.

## 2018-10-26 NOTE — ED Notes (Signed)
Called pharmacy for missing morning meds- stated they would verify them and send them

## 2018-10-26 NOTE — ED Notes (Signed)
Per 2A the charge nurse said that she talked to [facility] and per Webb Silversmith they can take the pt when they get down to 5 pts per nurse

## 2018-10-26 NOTE — ED Notes (Signed)
Patient advised that assignment of bed on floor would not be until day shift. Patient up to restroom, patient repositioned, and lights turned down. No other needs at this time.

## 2018-10-26 NOTE — ED Provider Notes (Signed)
[facility] Emergency Department Provider Note   First MD Initiated Contact with Patient 10/25/18 2339     (approximate)  I have reviewed the triage vital signs and the nursing notes.   HISTORY  Chief Complaint Chest Pain    HPI Stacey [name] is a 78 y.o. female with below listed previous medical conditions including atrial fibrillation CAD status post CABG hypertension hyperlipidemia presents to the emergency department secondary to central chest "fullness/discomfort" that began  10 minutes before arrival to the emergency department.  Patient also admits to dyspnea however no diaphoresis no nausea or vomiting.       Past Medical History:  Diagnosis Date  . Abnormal nuclear stress test 05/24/2017  . Arthritis   . Asthma   . Atrial fibrillation, transient ([place])   . 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 Cancer ([place])   . Complication of anesthesia   . Depression   . Family history of adverse reaction to anesthesia    most of family - PONV  . Fibromyalgia   . Glaucoma   . Headache    migraines - 1-2x/mo  . HTN (hypertension) 03/09/2007  . Hyperlipidemia LDL goal <70 03/25/1993  . Hypertension   . Motion sickness    all moving vehicles  . PONV (postoperative nausea and vomiting)   . Stroke ([place]) 2019  . Thyroid disease     Patient Active Problem List   Diagnosis Date Noted  . TIA (transient ischemic attack) 03/21/2018  . CVA (cerebral vascular accident) ([place]) 10/22/2017  . Abnormal nuclear stress test 05/24/2017  . 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 PAF (paroxysmal atrial fibrillation) ([place])   . Non-STEMI (non-ST elevated myocardial infarction) ([place]) 02/20/2017  . 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 Moderate mitral insufficiency 01/11/2017  . Personal history of colonic polyps   . Benign neoplasm of ascending colon   . Benign neoplasm of descending  colon   . Disequilibrium 09/16/2015  . Panic attacks 09/11/2015  . Ganglion cyst 08/16/2015  . Hypernatremia 07/04/2015  . Upper back pain 07/04/2015  . Other osteoarthritis involving multiple joints 02/06/2015  . Clinical depression 09/27/2014  . Dry mouth 09/27/2014  . Fibrositis 09/27/2014  . LBP (low back pain) 09/27/2014  . Avitaminosis D 09/27/2014  . Decreased leukocytes 09/27/2014  . Abnormal blood sugar 05/03/2009  . Insomnia 09/29/2007  . HTN (hypertension) 03/09/2007  . Adaptation reaction 01/29/2007  . Acid reflux 10/07/2006  . Menopausal and postmenopausal disorder 10/05/2006  . Headache, migraine 10/05/2006  . Arthritis, degenerative 03/23/1994  . Adult hypothyroidism 04/15/1993  . Hyperlipidemia LDL goal <70 03/25/1993    Past Surgical History:  Procedure Laterality Date  . ABDOMINAL HYSTERECTOMY    . APPENDECTOMY    . BLADDER SURGERY    . CATARACT EXTRACTION W/ INTRAOCULAR LENS  IMPLANT, BILATERAL    . COLONOSCOPY WITH PROPOFOL N/A 01/17/2016   Procedure: COLONOSCOPY WITH PROPOFOL;  Surgeon: [name], MD;  Location: [facility];  Service: Endoscopy;  Laterality: N/A;  . CORONARY ARTERY BYPASS GRAFT N/A 02/20/2017   Procedure: CORONARY ARTERY BYPASS GRAFTING (CABG) x , three using left internal mammary artery to left anterior descending coronary artery and right greater saphenous vein harvested endoscopically to distal right and diagonal coronary arteries.;  Surgeon: Grace Isaac, MD;  Location: [facility];  Service: Open Heart Surgery;  Laterality: N/A;  .  LEFT HEART CATH AND CORONARY ANGIOGRAPHY N/A 02/20/2017   Procedure: LEFT HEART CATH AND CORONARY ANGIOGRAPHY;  Surgeon: Isaias Cowman, MD;  Location: [facility];  Service: Cardiovascular;  Laterality: N/A;  . LEFT HEART CATH AND CORS/GRAFTS ANGIOGRAPHY N/A 05/24/2017   Procedure: LEFT HEART CATH AND CORS/GRAFTS ANGIOGRAPHY;  Surgeon: Sherren Mocha, MD;  Location: [facility];   Service: Cardiovascular;  Laterality: N/A;  . NASAL SINUS SURGERY    . POLYPECTOMY  01/17/2016   Procedure: POLYPECTOMY;  Surgeon: [name], MD;  Location: [facility];  Service: Endoscopy;;  . RIGHT OOPHORECTOMY    . TEE WITHOUT CARDIOVERSION N/A 02/20/2017   Procedure: TRANSESOPHAGEAL ECHOCARDIOGRAM (TEE);  Surgeon: Grace Isaac, MD;  Location: [facility];  Service: Open Heart Surgery;  Laterality: N/A;    Prior to Admission medications   Medication Sig Start Date End Date Taking? Authorizing Provider  acetaminophen (TYLENOL) 325 MG tablet Take 2 tablets (650 mg total) by mouth every 6 (six) hours as needed for mild pain. Patient not taking: Reported on 10/24/2018 02/28/17   Nani Skillern, PA-C  albuterol (VENTOLIN HFA) 108 (90 Base) MCG/ACT inhaler INHALE 2 PUFFS BY MOUTH EVERY 4 TO 6 HOURS AS NEEDED FOR SHORTNESS OF BREATH 08/15/18   [name], MD  amLODipine (NORVASC) 5 MG tablet Take 1 tablet (5 mg total) daily by mouth. 04/04/17   [name], MD  apixaban (ELIQUIS) 5 MG TABS tablet Take 1 tablet (5 mg total) by mouth 2 (two) times daily. 10/24/17   Gladstone Lighter, MD  aspirin 81 MG tablet Take 1 tablet (81 mg total) by mouth daily. 10/24/17   Gladstone Lighter, MD  buPROPion (WELLBUTRIN XL) 300 MG 24 hr tablet TAKE 1 TABLET BY MOUTH DAILY 09/13/18   [name], MD  capsaicin (ZOSTRIX) 0.025 % cream Apply 1 application topically as needed (nerve pain in chest).    [provider]  Cholecalciferol (VITAMIN D) 2000 units tablet Take 2,000 Units by mouth daily.    [provider]  Cyanocobalamin (B-12) 500 MCG TABS Take 500 mcg by mouth daily.     [provider]  diazepam (VALIUM) 5 MG tablet Take 1 tablet (5 mg total) by mouth every 12 (twelve) hours as needed for anxiety. 08/30/17   [name], MD  EPINEPHrine (EPIPEN 2-PAK) 0.3 mg/0.3 mL IJ SOAJ injection INJECT AS DIRECTED FOR SEVERE ALLERGIC  REACTION Patient taking differently: Inject 0.3 mg into the muscle as needed (for severe allergic reaction).  12/17/16   Mar Daring, PA-C  hydrochlorothiazide (HYDRODIURIL) 12.5 MG tablet Take 1 tablet (12.5 mg total) by mouth daily. 07/19/18 07/19/19  [name], MD  levothyroxine (SYNTHROID, LEVOTHROID) 100 MCG tablet TAKE 1 TABLET BY MOUTH EVERY DAY 05/09/18   [name], MD  lisinopril (PRINIVIL,ZESTRIL) 40 MG tablet TAKE 1 TABLET BY MOUTH EVERY DAY 06/13/18   [name], MD  metoprolol tartrate (LOPRESSOR) 25 MG tablet Take 1 1/2 tablets twice a day 08/22/18   [name], MD  Multiple Vitamin (MULTI-VITAMINS) TABS Take 1 tablet by mouth daily.    [provider]  oxymetazoline (AFRIN) 0.05 % nasal spray Place 1 spray into both nostrils as needed for congestion.    [provider]  rosuvastatin (CRESTOR) 40 MG tablet TAKE 1 TABLET BY MOUTH DAILY 10/19/18   Bacigalupo, Dionne Bucy, MD  traMADol (ULTRAM) 50 MG tablet Take  1 tablet (50 mg total) by mouth every 6 (six) hours as needed for moderate pain. 02/14/18   [name], MD  zolpidem (AMBIEN) 5 MG tablet Take 1 tablet (5 mg total) by mouth at bedtime as needed. for sleep 02/14/18   [name], MD    Allergies Cephalexin; Atorvastatin; Morphine and related; Procaine; Shellfish allergy; Statins; Tomato; and Penicillins  Family History  Problem Relation Age of Onset  . Alzheimer's disease Mother   . Kidney cancer Mother   . Heart attack Father   . Pancreatic cancer Sister   . Heart attack Brother 26  . Ovarian cancer Sister   . Kidney disease Sister        dialysis  . Cancer Sister        uterin  . Diabetes Sister   . Heart attack Sister   . Heart attack Brother 23  . Ovarian cancer Sister     Social History Social History   Tobacco Use  . Smoking status: Former Smoker    Last attempt to quit: 05/26/1975    Years since quitting: 43.4  .  Smokeless tobacco: Never Used  . Tobacco comment: quit 45  years ago   Substance Use Topics  . Alcohol use: No  . Drug use: No    Review of Systems Constitutional: No fever/chills Eyes: No visual changes. ENT: No sore throat. Cardiovascular: Positive for chest pain. Respiratory: Denies shortness of breath. Gastrointestinal: No abdominal pain.  No nausea, no vomiting.  No diarrhea.  No constipation. Genitourinary: Negative for dysuria. Musculoskeletal: Negative for neck pain.  Negative for back pain. Integumentary: Negative for rash. Neurological: Negative for headaches, focal weakness or numbness.  ____________________________________________   PHYSICAL EXAM:  VITAL SIGNS: ED Triage Vitals  Enc Vitals Group     BP 10/25/18 2330 (!) 176/115     Pulse Rate 10/25/18 2330 85     Resp 10/25/18 2330 18     Temp 10/25/18 2330 97.7 F (36.5 C)     Temp Source 10/25/18 2330 Oral     SpO2 10/25/18 2330 99 %     Weight 10/25/18 2333 77.1 kg (169 lb 15.6 oz)     Height 10/25/18 2333 1.575 m (5\' 2" )     Head Circumference --      Peak Flow --      Pain Score 10/25/18 2331 2     Pain Loc --      Pain Edu? --      Excl. in [place]? --     Constitutional: Alert and oriented. Well appearing and in no acute distress. Eyes: Conjunctivae are normal.  Head: Atraumatic. Mouth/Throat: Mucous membranes are moist. Oropharynx non-erythematous. Neck: No stridor. Cardiovascular: Normal rate, regular rhythm. Good peripheral circulation. Grossly normal heart sounds. Respiratory: Normal respiratory effort.  No retractions. No audible wheezing. Gastrointestinal: Soft and nontender. No distention.  Musculoskeletal: No lower extremity tenderness nor edema. No gross deformities of extremities. Neurologic:  Normal speech and language. No gross focal neurologic deficits are appreciated.  Skin:  Skin is warm, dry and intact. No rash noted. Psychiatric: Mood and affect are normal. Speech and behavior are  normal.  ____________________________________________   LABS (all labs ordered are listed, but only abnormal results are displayed)  Labs Reviewed  BASIC METABOLIC PANEL - Abnormal; Notable for the following components:      Result Value   Sodium 132 (*)    Chloride 95 (*)    Glucose, Bld 125 (*)  All other components within normal limits  SARS CORONAVIRUS 2 (HOSPITAL ORDER, [facility])  CBC  TROPONIN I  TROPONIN I   ____________________________________________  EKG ED ECG REPORT I, Dix N Layth Cerezo, the attending physician, personally viewed and interpreted this ECG.   Date: 10/25/2018  EKG Time: 1:40 PM  Rate: 101  Rhythm: Atrial fibrillation  Axis: Normal  Intervals: Irregular RR interval  ST&T Change: None   ____________________________________________  RADIOLOGY I, [place] N Samuell Knoble, personally viewed and evaluated these images (plain radiographs) as part of my medical decision making, as well as reviewing the written report by the radiologist.  ED MD interpretation: Active cardiopulmonary disease.  Official radiology report(s): Dg Lumbar Spine Complete  Result Date: 10/25/2018 CLINICAL DATA:  Pain for 1 year.  Worsening last 2 months. EXAM: LUMBAR SPINE - COMPLETE 4+ VIEW COMPARISON:  CT 12/31/2009. FINDINGS: Paraspinal soft tissues are unremarkable. Large amount stool noted throughout colon suggesting constipation. Aortoiliac and visceral vascular calcification. Diffuse multilevel degenerative change with scoliosis concave left. 2 mm retrolisthesis L1 on L2 and L2 on L3. This is most likely degenerative. No acute bony abnormality identified. IMPRESSION: 1. Diffuse multilevel degenerative change with scoliosis concave left. 2 mm retrolisthesis L1 on L2 and L2 on L3. This most likely degenerative. No acute bony abnormality. 2. Aortoiliac and visceral atherosclerotic vascular disease. Mild ectasia of the abdominal aorta can not be excluded.  Aortic ultrasound can be obtained to further evaluate. 3. Large amount of stool noted throughout the colon suggesting constipation. Electronically Signed   By: Marcello Moores  Register   On: 10/25/2018 10:52   Ct Head Wo Contrast  Result Date: 10/26/2018 CLINICAL DATA:  Altered level of consciousness. Headache and confusion. EXAM: CT HEAD WITHOUT CONTRAST TECHNIQUE: Contiguous axial images were obtained from the base of the skull through the vertex without intravenous contrast. COMPARISON:  None. FINDINGS: Brain: No evidence of acute infarction, hemorrhage, hydrocephalus, extra-axial collection or mass lesion/mass effect. An old right parietooccipital infarct is noted. Vascular: No hyperdense vessel or unexpected calcification. Skull: Normal. Negative for fracture or focal lesion. Sinuses/Orbits: No acute finding. Other: None. IMPRESSION: No acute intracranial abnormality. Electronically Signed   By: Constance Holster M.D.   On: 10/26/2018 01:34   Dg Chest Portable 1 View  Result Date: 10/26/2018 CLINICAL DATA:  Chest pain EXAM: PORTABLE CHEST 1 VIEW COMPARISON:  10/22/2017 FINDINGS: The cardiac size is stable from prior study. The patient is status post prior median sternotomy. Aortic calcifications are noted. There is no pneumothorax or large pleural effusion. There is no evidence of a displaced rib fracture. There is atelectasis versus scarring at the lung bases. IMPRESSION: No active disease. Electronically Signed   By: Constance Holster M.D.   On: 10/26/2018 01:16   Dg Hip Unilat With Pelvis 2-3 Views Right  Result Date: 10/25/2018 CLINICAL DATA:  Right hip pain. EXAM: DG HIP (WITH OR WITHOUT PELVIS) 2-3V RIGHT COMPARISON:  No recent prior. FINDINGS: Degenerative changes lumbar spine and both hips. No acute bony or joint abnormality identified. No evidence of fracture dislocation. Corticated tiny lucency noted the right femoral neck most likely a small cyst. Peripheral vascular calcification. Pelvic  calcifications consistent phleboliths. IMPRESSION: 1. Degenerative changes lumbar spine and both hips. No acute bony abnormality. Tiny corticated lucency noted over the right femoral neck, most likely a benign cyst. No acute bony abnormality. 2.  Peripheral vascular disease. Electronically Signed   By: Marcello Moores  Register   On: 10/25/2018 10:58  Procedures   ____________________________________________   INITIAL IMPRESSION / MDM / ASSESSMENT AND PLAN / ED COURSE  As part of my medical decision making, I reviewed the following data within the electronic MEDICAL RECORD NUMBER   78 year old female presented with above-stated history and physical exam secondary to chest pain.  Review of the patient's catheterization revealed 100% RCA and an 80% ostial.  Patient with ongoing chest "discomfort".  Considered possibly of CAD/MI and a such EKG was performed which revealed no evidence of ischemia or infarction.  Laboratory data including troponin x1-.  However patient with ongoing chest discomfort and as such patient discussed with Dr. Jannifer Franklin for hospital admission further evaluation and management.   *FARRIE SANN was evaluated in Emergency Department on 10/26/2018 for the symptoms described in the history of present illness. She was evaluated in the context of the global COVID-19 pandemic, which necessitated consideration that the patient might be at risk for infection with the SARS-CoV-2 virus that causes COVID-19. Institutional protocols and algorithms that pertain to the evaluation of patients at risk for COVID-19 are in a state of rapid change based on information released by regulatory bodies including the CDC and federal and state organizations. These policies and algorithms were followed during the patient's care in the ED.  Some ED evaluations and interventions may be delayed as a result of limited staffing during the pandemic.*   ____________________________________________  FINAL CLINICAL  IMPRESSION(S) / ED DIAGNOSES  Final diagnoses:  Chest pain, unspecified type     MEDICATIONS GIVEN DURING THIS VISIT:  Medications  nitroGLYCERIN (NITROGLYN) 2 % ointment 0.5 inch (0.5 inches Topical Given 10/26/18 0204)  aspirin chewable tablet 324 mg (324 mg Oral Given 10/26/18 0202)     ED Discharge Orders    None       Note:  This document was prepared using Dragon voice recognition software and may include unintentional dictation errors.   Gregor Hams, MD 10/26/18 [phone]

## 2018-10-26 NOTE — ED Notes (Signed)
ED TO INPATIENT HANDOFF REPORT  ED Nurse Name and Phone #: Ariel [phone]  S Name/Age/Gender Stacey [name] 78 y.o. female Room/Bed: ED07A/ED07A  Code Status   Code Status: Full Code  Home/SNF/Other Home Patient oriented to: self, place, time and situation Is this baseline? Yes   Triage Complete: Triage complete  Chief Complaint chest pain   Triage Note Pt to triage via w/c with no distress noted;pt reports dull pain beneath left breast since 49min PTA with no accomp symptoms; denies hx of same but reports hx afib   Allergies Allergies  Allergen Reactions  . Cephalexin Anaphylaxis  . Atorvastatin   . Morphine And Related Hives  . Procaine Other (See Comments)    Chest pain Chest pain  . Shellfish Allergy Swelling    angioedema  . Statins Other (See Comments)    Muscle pain  . Tomato Other (See Comments)    (by testing) - also beans, wheat, peas (by testing) - also beans, wheat, peas  . Penicillins Hives and Rash    Has patient had a PCN reaction causing immediate rash, facial/tongue/throat swelling, SOB or lightheadedness with hypotension: Yes Has patient had a PCN reaction causing severe rash involving mucus membranes or skin necrosis: No Has patient had a PCN reaction that required hospitalization: No Has patient had a PCN reaction occurring within the last 10 years: Yes If all of the above answers are "NO", then may proceed with Cephalosporin use.    Level of Care/Admitting Diagnosis ED Disposition    ED Disposition Condition [facility] Area: [place] [[id number]]  Level of Care: Telemetry [5]  Covid Evaluation: Screening Protocol (No Symptoms)  Diagnosis: Chest pain [621308]  Admitting Physician: Harrie Foreman [[id number]]  Attending Physician: Harrie Foreman [[id number]]  PT Class (Do Not Modify): Observation [104]  PT Acc Code (Do Not Modify): Observation [10022]       B Medical/Surgery History Past  Medical History:  Diagnosis Date  . Abnormal nuclear stress test 05/24/2017  . Arthritis   . Asthma   . Atrial fibrillation, transient ([place])   . 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 Cancer ([place])   . Complication of anesthesia   . Depression   . Family history of adverse reaction to anesthesia    most of family - PONV  . Fibromyalgia   . Glaucoma   . Headache    migraines - 1-2x/mo  . HTN (hypertension) 03/09/2007  . Hyperlipidemia LDL goal <70 03/25/1993  . Hypertension   . Motion sickness    all moving vehicles  . PONV (postoperative nausea and vomiting)   . Stroke ([place]) 2019  . Thyroid disease    Past Surgical History:  Procedure Laterality Date  . ABDOMINAL HYSTERECTOMY    . APPENDECTOMY    . BLADDER SURGERY    . CATARACT EXTRACTION W/ INTRAOCULAR LENS  IMPLANT, BILATERAL    . COLONOSCOPY WITH PROPOFOL N/A 01/17/2016   Procedure: COLONOSCOPY WITH PROPOFOL;  Surgeon: [name], MD;  Location: [place];  Service: Endoscopy;  Laterality: N/A;  . CORONARY ARTERY BYPASS GRAFT N/A 02/20/2017   Procedure: CORONARY ARTERY BYPASS GRAFTING (CABG) x , three using left internal mammary artery to left anterior descending coronary artery and right greater saphenous vein harvested endoscopically to distal right and diagonal coronary arteries.;  Surgeon: Grace Isaac, MD;  Location: [place];  Service: Open Heart Surgery;  Laterality: N/A;  . LEFT HEART CATH AND CORONARY ANGIOGRAPHY N/A 02/20/2017   Procedure: LEFT HEART CATH AND CORONARY ANGIOGRAPHY;  Surgeon: Isaias Cowman, MD;  Location: [place] [facility];  Service: Cardiovascular;  Laterality: N/A;  . LEFT HEART CATH AND CORS/GRAFTS ANGIOGRAPHY N/A 05/24/2017   Procedure: LEFT HEART CATH AND CORS/GRAFTS ANGIOGRAPHY;  Surgeon: Sherren Mocha, MD;  Location: [place] [facility];  Service: Cardiovascular;  Laterality: N/A;  . NASAL SINUS SURGERY    . POLYPECTOMY  01/17/2016    Procedure: POLYPECTOMY;  Surgeon: [name], MD;  Location: [place];  Service: Endoscopy;;  . RIGHT OOPHORECTOMY    . TEE WITHOUT CARDIOVERSION N/A 02/20/2017   Procedure: TRANSESOPHAGEAL ECHOCARDIOGRAM (TEE);  Surgeon: Grace Isaac, MD;  Location: [place];  Service: Open Heart Surgery;  Laterality: N/A;     A IV Location/Drains/Wounds Patient Lines/Drains/Airways Status   Active Line/Drains/Airways    Name:   Placement date:   Placement time:   Site:   Days:   Peripheral IV 10/25/18 Left Antecubital   10/25/18    2339    Antecubital   1   Incision (Closed) 02/20/17 Chest Other (Comment)   02/20/17    1425     613   Incision (Closed) 02/20/17 Leg Right   02/20/17    1425     613          Intake/Output Last 24 hours No intake or output data in the 24 hours ending 10/26/18 1412  Labs/Imaging Results for orders placed or performed during the hospital encounter of 10/25/18 (from the past 48 hour(s))  Basic metabolic panel     Status: Abnormal   Collection Time: 10/25/18 11:43 PM  Result Value Ref Range   Sodium 132 (L) 135 - 145 mmol/L   Potassium 4.2 3.5 - 5.1 mmol/L   Chloride 95 (L) 98 - 111 mmol/L   CO2 28 22 - 32 mmol/L   Glucose, Bld 125 (H) 70 - 99 mg/dL   BUN 23 8 - 23 mg/dL   Creatinine, Ser 0.92 0.44 - 1.00 mg/dL   Calcium 9.3 8.9 - 10.3 mg/dL   GFR calc non Af Amer >60 >60 mL/min   GFR calc Af Amer >60 >60 mL/min   Anion gap 9 5 - 15    Comment: Performed at [facility], [address] [place], [place] [address]  CBC     Status: None   Collection Time: 10/25/18 11:43 PM  Result Value Ref Range   WBC 7.7 4.0 - 10.5 K/uL   RBC 5.03 3.87 - 5.11 MIL/uL   Hemoglobin 14.7 12.0 - 15.0 g/dL   HCT 44.5 36.0 - 46.0 %   MCV 88.5 80.0 - 100.0 fL   MCH 29.2 26.0 - 34.0 pg   MCHC 33.0 30.0 - 36.0 g/dL   RDW 12.9 11.5 - 15.5 %   Platelets 168 150 - 400 K/uL   nRBC 0.0 0.0 - 0.2 %    Comment: Performed at [facility], [address] [place], [place] [address]  Troponin I - ONCE - STAT     Status: None   Collection Time: 10/25/18 11:43 PM  Result Value Ref Range   Troponin I <0.03 <0.03 ng/mL    Comment: Performed at [facility], [address] [place], [place] [address]  Hemoglobin A1c     Status: None   Collection Time: 10/25/18 11:43 PM  Result Value Ref Range   Hgb  A1c MFr Bld 5.5 4.8 - 5.6 %    Comment: (NOTE) Pre diabetes:          5.7%-6.4% Diabetes:              >6.4% Glycemic control for   <7.0% adults with diabetes    Mean Plasma Glucose 111.15 mg/dL    Comment: Performed at [facility], [place] [address] [place], [place] [address]  Troponin I - ONCE - STAT     Status: None   Collection Time: 10/26/18  2:20 AM  Result Value Ref Range   Troponin I <0.03 <0.03 ng/mL    Comment: Performed at [facility], [address] [place], [place] [address]  SARS Coronavirus 2 (CEPHEID - Performed in [facility] hospital lab), Hosp Order     Status: None   Collection Time: 10/26/18  2:20 AM  Result Value Ref Range   SARS Coronavirus 2 NEGATIVE NEGATIVE    Comment: (NOTE) If result is NEGATIVE SARS-CoV-2 target nucleic acids are NOT DETECTED. The SARS-CoV-2 RNA is generally detectable in upper and lower  respiratory specimens during the acute phase of infection. The lowest  concentration of SARS-CoV-2 viral copies this assay can detect is 250  copies / mL. A negative result does not preclude SARS-CoV-2 infection  and should not be used as the sole basis for treatment or other  patient management decisions.  A negative result may occur with  improper specimen collection / handling, submission of specimen other  than nasopharyngeal swab, presence of viral mutation(s) within the  areas targeted by this assay, and inadequate number of viral copies  (<250 copies / mL). A negative result must be combined with clinical  observations, patient history, and epidemiological information. If  result is POSITIVE SARS-CoV-2 target nucleic acids are DETECTED. The SARS-CoV-2 RNA is generally detectable in upper and lower  respiratory specimens dur ing the acute phase of infection.  Positive  results are indicative of active infection with SARS-CoV-2.  Clinical  correlation with patient history and other diagnostic information is  necessary to determine patient infection status.  Positive results do  not rule out bacterial infection or co-infection with other viruses. If result is PRESUMPTIVE POSTIVE SARS-CoV-2 nucleic acids MAY BE PRESENT.   A presumptive positive result was obtained on the submitted specimen  and confirmed on repeat testing.  While 2019 novel coronavirus  (SARS-CoV-2) nucleic acids may be present in the submitted sample  additional confirmatory testing may be necessary for epidemiological  and / or clinical management purposes  to differentiate between  SARS-CoV-2 and other Sarbecovirus currently known to infect humans.  If clinically indicated additional testing with an alternate test  methodology [phone]) is advised. The SARS-CoV-2 RNA is generally  detectable in upper and lower respiratory sp ecimens during the acute  phase of infection. The expected result is Negative. Fact Sheet for Patients:  StrictlyIdeas.no Fact Sheet for Healthcare Providers: BankingDealers.co.za This test is not yet approved or cleared by the [place] FDA and has been authorized for detection and/or diagnosis of SARS-CoV-2 by FDA under an Emergency Use Authorization (EUA).  This EUA will remain in effect (meaning this test can be used) for the duration of the COVID-19 declaration under Section 564(b)(1) of the Act, 21 U.S.C. section 360bbb-3(b)(1), unless the authorization is terminated or revoked sooner. Performed at [facility], [address] [place], [place] [address]   TSH     Status: None   Collection Time:  10/26/18  5:31 AM  Result Value Ref Range   TSH 0.595 0.350 - 4.500 uIU/mL    Comment: Performed by a 3rd Generation assay with a functional sensitivity of <=0.01 uIU/mL. Performed at [facility], [address] [place], [place] [address]   Troponin I - Now Then Q6H     Status: None   Collection Time: 10/26/18  5:31 AM  Result Value Ref Range   Troponin I <0.03 <0.03 ng/mL    Comment: Performed at [facility], [address] [place], [place] [address]   Dg Lumbar Spine Complete  Result Date: 10/25/2018 CLINICAL DATA:  Pain for 1 year.  Worsening last 2 months. EXAM: LUMBAR SPINE - COMPLETE 4+ VIEW COMPARISON:  CT 12/31/2009. FINDINGS: Paraspinal soft tissues are unremarkable. Large amount stool noted throughout colon suggesting constipation. Aortoiliac and visceral vascular calcification. Diffuse multilevel degenerative change with scoliosis concave left. 2 mm retrolisthesis L1 on L2 and L2 on L3. This is most likely degenerative. No acute bony abnormality identified. IMPRESSION: 1. Diffuse multilevel degenerative change with scoliosis concave left. 2 mm retrolisthesis L1 on L2 and L2 on L3. This most likely degenerative. No acute bony abnormality. 2. Aortoiliac and visceral atherosclerotic vascular disease. Mild ectasia of the abdominal aorta can not be excluded. Aortic ultrasound can be obtained to further evaluate. 3. Large amount of stool noted throughout the colon suggesting constipation. Electronically Signed   By: Marcello Moores  Register   On: 10/25/2018 10:52   Ct Head Wo Contrast  Result Date: 10/26/2018 CLINICAL DATA:  Altered level of consciousness. Headache and confusion. EXAM: CT HEAD WITHOUT CONTRAST TECHNIQUE: Contiguous axial images were obtained from the base of the skull through the vertex without intravenous contrast. COMPARISON:  None. FINDINGS: Brain: No evidence of acute infarction, hemorrhage, hydrocephalus, extra-axial collection or mass lesion/mass effect. An  old right parietooccipital infarct is noted. Vascular: No hyperdense vessel or unexpected calcification. Skull: Normal. Negative for fracture or focal lesion. Sinuses/Orbits: No acute finding. Other: None. IMPRESSION: No acute intracranial abnormality. Electronically Signed   By: Constance Holster M.D.   On: 10/26/2018 01:34   Dg Chest Portable 1 View  Result Date: 10/26/2018 CLINICAL DATA:  Chest pain EXAM: PORTABLE CHEST 1 VIEW COMPARISON:  10/22/2017 FINDINGS: The cardiac size is stable from prior study. The patient is status post prior median sternotomy. Aortic calcifications are noted. There is no pneumothorax or large pleural effusion. There is no evidence of a displaced rib fracture. There is atelectasis versus scarring at the lung bases. IMPRESSION: No active disease. Electronically Signed   By: Constance Holster M.D.   On: 10/26/2018 01:16   Dg Hip Unilat With Pelvis 2-3 Views Right  Result Date: 10/25/2018 CLINICAL DATA:  Right hip pain. EXAM: DG HIP (WITH OR WITHOUT PELVIS) 2-3V RIGHT COMPARISON:  No recent prior. FINDINGS: Degenerative changes lumbar spine and both hips. No acute bony or joint abnormality identified. No evidence of fracture dislocation. Corticated tiny lucency noted the right femoral neck most likely a small cyst. Peripheral vascular calcification. Pelvic calcifications consistent phleboliths. IMPRESSION: 1. Degenerative changes lumbar spine and both hips. No acute bony abnormality. Tiny corticated lucency noted over the right femoral neck, most likely a benign cyst. No acute bony abnormality. 2.  Peripheral vascular disease. Electronically Signed   By: Marcello Moores  Register   On: 10/25/2018 10:58    Pending Labs Unresulted Labs (From admission, onward)    Start     Ordered   11/02/18 0500  Creatinine, serum  (  enoxaparin (LOVENOX)    CrCl >/= 30 ml/min)  Weekly,   STAT    Comments:  while on enoxaparin therapy    10/26/18 0521   10/26/18 0521  Troponin I - Now Then Q6H  Now  then every 6 hours,   STAT     10/26/18 0521          Vitals/Pain Today's Vitals   10/26/18 0900 10/26/18 1000 10/26/18 1100 10/26/18 1200  BP: (!) 143/74 129/81 125/72 138/73  Pulse: 76 81 66 66  Resp: 17 17 (!) 22 (!) 23  Temp:      TempSrc:      SpO2: 97% 95% 94% 94%  Weight:      Height:      PainSc:        Isolation Precautions No active isolations  Medications Medications  nitroGLYCERIN (NITROGLYN) 2 % ointment 0.5 inch (0.5 inches Topical Refused 10/26/18 1258)  acetaminophen (TYLENOL) tablet 650 mg (has no administration in time range)    Or  acetaminophen (TYLENOL) suppository 650 mg (has no administration in time range)  docusate sodium (COLACE) capsule 100 mg (100 mg Oral Given 10/26/18 1203)  aspirin suppository 300 mg (300 mg Rectal Refused 10/26/18 1257)  amLODipine (NORVASC) tablet 5 mg (5 mg Oral Given 10/26/18 1202)  albuterol (PROVENTIL) (2.5 MG/3ML) 0.083% nebulizer solution 2.5 mg (has no administration in time range)  cholecalciferol (VITAMIN D3) tablet 2,000 Units (2,000 Units Oral Given 10/26/18 1205)  vitamin B-12 (CYANOCOBALAMIN) tablet 500 mcg (has no administration in time range)  multivitamin with minerals tablet 1 tablet (1 tablet Oral Given 10/26/18 1202)  diazepam (VALIUM) tablet 5 mg (has no administration in time range)  apixaban (ELIQUIS) tablet 5 mg (5 mg Oral Given 10/26/18 1203)  aspirin EC tablet 81 mg (81 mg Oral Given 10/26/18 1201)  levothyroxine (SYNTHROID) tablet 100 mcg (100 mcg Oral Given 10/26/18 1205)  lisinopril (ZESTRIL) tablet 40 mg (40 mg Oral Given 10/26/18 1206)  hydrochlorothiazide (HYDRODIURIL) tablet 12.5 mg (12.5 mg Oral Given 10/26/18 1253)  buPROPion (WELLBUTRIN XL) 24 hr tablet 300 mg (300 mg Oral Given 10/26/18 1204)  rosuvastatin (CRESTOR) tablet 40 mg (has no administration in time range)  vitamin C (ASCORBIC ACID) tablet 500 mg (has no administration in time range)  metoprolol tartrate (LOPRESSOR) tablet 37.5 mg (37.5 mg Oral Given  10/26/18 1254)  zolpidem (AMBIEN) tablet 2.5 mg (has no administration in time range)  aspirin chewable tablet 324 mg (324 mg Oral Given 10/26/18 0202)  ondansetron (ZOFRAN) injection 4 mg (4 mg Intravenous Given 10/26/18 0719)    Mobility walks Low fall risk   Focused Assessments Cardiac Assessment Handoff:  Cardiac Rhythm: Atrial fibrillation Lab Results  Component Value Date   CKTOTAL 52 07/12/2017   TROPONINI <0.03 10/26/2018   No results found for: DDIMER Does the Patient currently have chest pain? No     R Recommendations: See Admitting Provider Note  Report given to:   Additional Notes:

## 2018-10-26 NOTE — ED Notes (Addendum)
Patient up to restroom. No other needs at this time.

## 2018-10-26 NOTE — ED Notes (Signed)
Pt given wipe for hand hygiene and lunch tray given

## 2018-10-26 NOTE — ED Notes (Signed)
Attempted to call report and was advised that they had no place to put this pt and that census was to high for their staffing at this time

## 2018-10-26 NOTE — ED Notes (Signed)
Report given to Ashley RN

## 2018-10-26 NOTE — Consult Note (Signed)
[facility] Cardiology  CARDIOLOGY CONSULT NOTE  Patient ID: Stacey [name] MRN: [id number] DOB/AGE: 1940/07/22 78 y.o.  Admit date: 10/25/2018 Referring Physician Marcille Blanco Primary Physician Miguel Aschoff, MD Primary Cardiologist [name] Reason for Consultation Chest pain  HPI: 78 year old female referred for evaluation of chest pain. The patient has a history of coronary artery disease, status post CABG x 3 in 01/2017, and cardiac catheterization in 04/2017 revealing widely patent LIMA-LAD and SVG-distal RCA, with patency of SVG-diagonal with slow flow, likely secondary to SVG-target vessel size mismatch. She also has a history of TIA/CVA, persistent atrial fibrillation on Eliquis, hypertension, type II diabetes, and hyperlipidemia. The patient reports being in her usual state of health until yesterday evening. She checked her blood pressure as she typically does before her evening medications, and noted that it was elevated. She did feel "swimmy-headed" and had mild chest pressure without pain without radiation, nausea, or vomiting. She felt shortness of breath while walking to the kitchen. When she checked her blood pressure about 30 minutes later, it was elevated to 409 systolic, so she decided to come to the ER. Blood pressure was elevated upon arrival to 176/115. ECG revealed atrial fibrillation at a rate of 101 bpm without acute ST-T wave abnormalities. Chest xray negative for active disease. Admission labs notable for troponin less than 0.03 x 3, and unremarkable CBC and BMP.  2D echocardiogram 02/2018 revealed normal left ventricular function with LVEF 55-60%. Currently, the patient reports feeling much better without recurrent chest pain since early this morning. Her blood pressure has completely normalized. She denies shortness of breath. She has occasional palpitations, which is not new for her. She denies any recent peripheral edema. The patient is interested in going home and not being  admitted.  Review of systems complete and found to be negative unless listed above     Past Medical History:  Diagnosis Date  . Abnormal nuclear stress test 05/24/2017  . Arthritis   . Asthma   . Atrial fibrillation, transient ([place])   . 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 Cancer ([place])   . Complication of anesthesia   . Depression   . Family history of adverse reaction to anesthesia    most of family - PONV  . Fibromyalgia   . Glaucoma   . Headache    migraines - 1-2x/mo  . HTN (hypertension) 03/09/2007  . Hyperlipidemia LDL goal <70 03/25/1993  . Hypertension   . Motion sickness    all moving vehicles  . PONV (postoperative nausea and vomiting)   . Stroke ([place]) 2019  . Thyroid disease     Past Surgical History:  Procedure Laterality Date  . ABDOMINAL HYSTERECTOMY    . APPENDECTOMY    . BLADDER SURGERY    . CATARACT EXTRACTION W/ INTRAOCULAR LENS  IMPLANT, BILATERAL    . COLONOSCOPY WITH PROPOFOL N/A 01/17/2016   Procedure: COLONOSCOPY WITH PROPOFOL;  Surgeon: [name], MD;  Location: [place];  Service: Endoscopy;  Laterality: N/A;  . CORONARY ARTERY BYPASS GRAFT N/A 02/20/2017   Procedure: CORONARY ARTERY BYPASS GRAFTING (CABG) x , three using left internal mammary artery to left anterior descending coronary artery and right greater saphenous vein harvested endoscopically to distal right and diagonal coronary arteries.;  Surgeon: Grace Isaac, MD;  Location: [place];  Service: Open Heart Surgery;  Laterality: N/A;  . LEFT HEART CATH AND CORONARY ANGIOGRAPHY N/A 02/20/2017   Procedure: LEFT HEART  CATH AND CORONARY ANGIOGRAPHY;  Surgeon: Isaias Cowman, MD;  Location: [facility];  Service: Cardiovascular;  Laterality: N/A;  . LEFT HEART CATH AND CORS/GRAFTS ANGIOGRAPHY N/A 05/24/2017   Procedure: LEFT HEART CATH AND CORS/GRAFTS ANGIOGRAPHY;  Surgeon: Sherren Mocha, MD;  Location: [place] [facility];  Service: Cardiovascular;  Laterality: N/A;  . NASAL SINUS SURGERY    . POLYPECTOMY  01/17/2016   Procedure: POLYPECTOMY;  Surgeon: [name], MD;  Location: [place];  Service: Endoscopy;;  . RIGHT OOPHORECTOMY    . TEE WITHOUT CARDIOVERSION N/A 02/20/2017   Procedure: TRANSESOPHAGEAL ECHOCARDIOGRAM (TEE);  Surgeon: Grace Isaac, MD;  Location: [place];  Service: Open Heart Surgery;  Laterality: N/A;    (Not in a hospital admission)  Social History   Socioeconomic History  . Marital status: Married    Spouse name: [name]  . Number of children: 3  . Years of education: Not on file  . Highest education level: GED or equivalent  Occupational History    Employer: [employer]  . Financial resource strain: Not hard at all  . Food insecurity:    Worry: Never true    Inability: Never true  . Transportation needs:    Medical: No    Non-medical: No  Tobacco Use  . Smoking status: Former Smoker    Last attempt to quit: 05/26/1975    Years since quitting: 43.4  . Smokeless tobacco: Never Used  . Tobacco comment: quit 45  years ago   Substance and Sexual Activity  . Alcohol use: No  . Drug use: No  . Sexual activity: Not Currently  Lifestyle  . Physical activity:    Days per week: Not on file    Minutes per session: Not on file  . Stress: Only a little  Relationships  . Social connections:    Talks on phone: Not on file    Gets together: Not on file    Attends religious service: Not on file    Active member of club or organization: Not on file    Attends meetings of clubs or organizations: Not on file    Relationship status: Not on file  . Intimate partner violence:    Fear of current or ex partner: Not on file    Emotionally abused: Not on file    Physically abused: Not on file    Forced sexual activity: Not on file  Other Topics Concern  . Not on file  Social History Narrative  . Not on file    Family History  Problem Relation  Age of Onset  . Alzheimer's disease Mother   . Kidney cancer Mother   . Heart attack Father   . Pancreatic cancer Sister   . Heart attack Brother 57  . Ovarian cancer Sister   . Kidney disease Sister        dialysis  . Cancer Sister        uterin  . Diabetes Sister   . Heart attack Sister   . Heart attack Brother 84  . Ovarian cancer Sister       Review of systems complete and found to be negative unless listed above      PHYSICAL EXAM  General: Well developed, well nourished, in no acute distress HEENT:  Normocephalic and atramatic Neck:  No JVD.  Lungs: Clear bilaterally to auscultation, normal effort of breathing on room air Heart: irregularly irregular  Abdomen: nondistended Msk:  Back normal,  gait not assessed. Normal strength and tone for age. Extremities: No clubbing, cyanosis or edema.   Neuro: Alert and oriented X 3. Psych:  Good affect, responds appropriately  Labs:   Lab Results  Component Value Date   WBC 7.7 10/25/2018   HGB 14.7 10/25/2018   HCT 44.5 10/25/2018   MCV 88.5 10/25/2018   PLT 168 10/25/2018    Recent Labs  Lab 10/25/18 2343  NA 132*  K 4.2  CL 95*  CO2 28  BUN 23  CREATININE 0.92  CALCIUM 9.3  GLUCOSE 125*   Lab Results  Component Value Date   CKTOTAL 52 07/12/2017   TROPONINI <0.03 10/26/2018    Lab Results  Component Value Date   CHOL 144 03/22/2018   CHOL 137 10/23/2017   CHOL 155 07/12/2017   Lab Results  Component Value Date   HDL 39 (L) 03/22/2018   HDL 45 10/23/2017   HDL 54 07/12/2017   Lab Results  Component Value Date   LDLCALC 77 03/22/2018   LDLCALC 68 10/23/2017   LDLCALC 71 07/12/2017   Lab Results  Component Value Date   TRIG 141 03/22/2018   TRIG 119 10/23/2017   TRIG 148 07/12/2017   Lab Results  Component Value Date   CHOLHDL 3.7 03/22/2018   CHOLHDL 3.0 10/23/2017   CHOLHDL 2.9 07/12/2017   No results found for: LDLDIRECT    Radiology: Dg Lumbar Spine Complete  Result Date:  10/25/2018 CLINICAL DATA:  Pain for 1 year.  Worsening last 2 months. EXAM: LUMBAR SPINE - COMPLETE 4+ VIEW COMPARISON:  CT 12/31/2009. FINDINGS: Paraspinal soft tissues are unremarkable. Large amount stool noted throughout colon suggesting constipation. Aortoiliac and visceral vascular calcification. Diffuse multilevel degenerative change with scoliosis concave left. 2 mm retrolisthesis L1 on L2 and L2 on L3. This is most likely degenerative. No acute bony abnormality identified. IMPRESSION: 1. Diffuse multilevel degenerative change with scoliosis concave left. 2 mm retrolisthesis L1 on L2 and L2 on L3. This most likely degenerative. No acute bony abnormality. 2. Aortoiliac and visceral atherosclerotic vascular disease. Mild ectasia of the abdominal aorta can not be excluded. Aortic ultrasound can be obtained to further evaluate. 3. Large amount of stool noted throughout the colon suggesting constipation. Electronically Signed   By: Marcello Moores  Register   On: 10/25/2018 10:52   Ct Head Wo Contrast  Result Date: 10/26/2018 CLINICAL DATA:  Altered level of consciousness. Headache and confusion. EXAM: CT HEAD WITHOUT CONTRAST TECHNIQUE: Contiguous axial images were obtained from the base of the skull through the vertex without intravenous contrast. COMPARISON:  None. FINDINGS: Brain: No evidence of acute infarction, hemorrhage, hydrocephalus, extra-axial collection or mass lesion/mass effect. An old right parietooccipital infarct is noted. Vascular: No hyperdense vessel or unexpected calcification. Skull: Normal. Negative for fracture or focal lesion. Sinuses/Orbits: No acute finding. Other: None. IMPRESSION: No acute intracranial abnormality. Electronically Signed   By: Constance Holster M.D.   On: 10/26/2018 01:34   US Pelvis Complete  Result Date: 10/24/2018 Patient Name: JACLIN FINKS DOB: 10-19-1940 MRN: [id number] ULTRASOUND REPORT Location: [facility] Date of Service: 10/24/2018 Indications:Abnormal  Uterine Bleeding Findings: The uterus is anteverted and measures 4.2 x 2.8 x 1.5 cm. Echo texture is homogenous without evidence of focal masses. The Endometrium measures 3.0 mm. Right Ovary was not visualized. Left Ovary was not visualized. Survey of the adnexa demonstrates no adnexal masses. There is no free fluid in the cul de sac. Impression: 1.Ovarys was not visualized due to  large amounts of bowels in adnexal regions. Recommendations: 1.Clinical correlation with the patient's History and Physical Exam. Lillia Dallas, RDMS Images reviewed.  Normal GYN study without visualized pathology.  Malachy Mood, MD, [facility], [facility]   Dg Chest Portable 1 View  Result Date: 10/26/2018 CLINICAL DATA:  Chest pain EXAM: PORTABLE CHEST 1 VIEW COMPARISON:  10/22/2017 FINDINGS: The cardiac size is stable from prior study. The patient is status post prior median sternotomy. Aortic calcifications are noted. There is no pneumothorax or large pleural effusion. There is no evidence of a displaced rib fracture. There is atelectasis versus scarring at the lung bases. IMPRESSION: No active disease. Electronically Signed   By: Constance Holster M.D.   On: 10/26/2018 01:16   Dg Hip Unilat With Pelvis 2-3 Views Right  Result Date: 10/25/2018 CLINICAL DATA:  Right hip pain. EXAM: DG HIP (WITH OR WITHOUT PELVIS) 2-3V RIGHT COMPARISON:  No recent prior. FINDINGS: Degenerative changes lumbar spine and both hips. No acute bony or joint abnormality identified. No evidence of fracture dislocation. Corticated tiny lucency noted the right femoral neck most likely a small cyst. Peripheral vascular calcification. Pelvic calcifications consistent phleboliths. IMPRESSION: 1. Degenerative changes lumbar spine and both hips. No acute bony abnormality. Tiny corticated lucency noted over the right femoral neck, most likely a benign cyst. No acute bony abnormality. 2.  Peripheral vascular disease. Electronically  Signed   By: Marcello Moores  Register   On: 10/25/2018 10:58    EKG: atrial fibrillation, rate 70s  ASSESSMENT AND PLAN:  1. Chest pain, atypical, with negative troponin x 3, and ECG without acute ST-T wave abnormalities, in the setting of elevated blood pressure, which has resolved. Patient denies recurrent chest pain for the last several hours. Chest xray negative. Other labs unremarkable. 2.  Coronary artery disease status post CABG x 3 in 01/2017, and cardiac catheterization in 04/2017 revealing widely patent LIMA-LAD and SVG-distal RCA, with patency of SVG-diagonal with slow flow, likely secondary to SVG-target vessel size mismatch. 3. Persistent atrial fibrillation, rate controlled on metoprolol, on Eliquis for stroke prevention 4. Hypertension, initially markedly elevated, which was the reason for the patient coming to the ER. Has completely normalized.   Recommendations: 1. Agree with current therapy 2. No further cardiac diagnostics recommended at this time as patient has no recurrent chest pain, troponin negative, ECG unremarkable. May be discussed further as outpatient. 3. Monitor blood pressure and symptoms overnight with possible discharge tomorrow morning if remains stable. 4. Continue Eliquis for stroke prevention 5. Follow-up with Dr. [name] in 1 week.  Signed: Clabe Seal PA-C 10/26/2018, 4:51 PM   Discussed patient with Dr. Saralyn Pilar and the plan was made in collaboration with him.

## 2018-10-26 NOTE — ED Notes (Signed)
Hand hygiene performed and meal tray given

## 2018-10-26 NOTE — ED Notes (Signed)
Assisted pt to the bathroom

## 2018-10-26 NOTE — ED Notes (Signed)
Report given to Helene Kelp, RN and [employer], Therapist, sports.

## 2018-10-26 NOTE — ED Notes (Signed)
Admitting dr at bedside.  

## 2018-10-26 NOTE — Progress Notes (Signed)
Stacey [name] seen and examined admitted with chest pain noted to have accelerated hypertension troponins negative now her symptoms have resolved patient is doing better we will continue to monitor

## 2018-10-26 NOTE — H&P (Signed)
Stacey [name] is an 78 y.o. female.   Chief Complaint: Chest pain HPI: The patient with past medical history of CAD status post CABG, atrial fibrillation, hypertension and hypothyroidism presents to the emergency department complaining of chest pain.  The patient reports tightness in the center of her chest while sitting on the couch.  She denies shortness of breath, nausea, vomiting or diaphoresis.  Following administration of aspirin in the emergency department the patient continued to have a dull aching pain in the center of her chest.  She is concerned that it may be anxiety.  The patient was also significantly hypertensive in the emergency department.  Thus, due to the patient's cardiac risk factors the emergency department staff called the hospitalist service for admission.  Past Medical History:  Diagnosis Date  . Abnormal nuclear stress test 05/24/2017  . Arthritis   . Asthma   . Atrial fibrillation, transient ([place])   . 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 Cancer ([place])   . Complication of anesthesia   . Depression   . Family history of adverse reaction to anesthesia    most of family - PONV  . Fibromyalgia   . Glaucoma   . Headache    migraines - 1-2x/mo  . HTN (hypertension) 03/09/2007  . Hyperlipidemia LDL goal <70 03/25/1993  . Hypertension   . Motion sickness    all moving vehicles  . PONV (postoperative nausea and vomiting)   . Stroke ([place]) 2019  . Thyroid disease     Past Surgical History:  Procedure Laterality Date  . ABDOMINAL HYSTERECTOMY    . APPENDECTOMY    . BLADDER SURGERY    . CATARACT EXTRACTION W/ INTRAOCULAR LENS  IMPLANT, BILATERAL    . COLONOSCOPY WITH PROPOFOL N/A 01/17/2016   Procedure: COLONOSCOPY WITH PROPOFOL;  Surgeon: [name], MD;  Location: [facility];  Service: Endoscopy;  Laterality: N/A;  . CORONARY ARTERY BYPASS GRAFT N/A 02/20/2017   Procedure: CORONARY ARTERY BYPASS GRAFTING  (CABG) x , three using left internal mammary artery to left anterior descending coronary artery and right greater saphenous vein harvested endoscopically to distal right and diagonal coronary arteries.;  Surgeon: Grace Isaac, MD;  Location: [facility];  Service: Open Heart Surgery;  Laterality: N/A;  . LEFT HEART CATH AND CORONARY ANGIOGRAPHY N/A 02/20/2017   Procedure: LEFT HEART CATH AND CORONARY ANGIOGRAPHY;  Surgeon: Isaias Cowman, MD;  Location: [facility];  Service: Cardiovascular;  Laterality: N/A;  . LEFT HEART CATH AND CORS/GRAFTS ANGIOGRAPHY N/A 05/24/2017   Procedure: LEFT HEART CATH AND CORS/GRAFTS ANGIOGRAPHY;  Surgeon: Sherren Mocha, MD;  Location: [facility];  Service: Cardiovascular;  Laterality: N/A;  . NASAL SINUS SURGERY    . POLYPECTOMY  01/17/2016   Procedure: POLYPECTOMY;  Surgeon: [name], MD;  Location: [facility];  Service: Endoscopy;;  . RIGHT OOPHORECTOMY    . TEE WITHOUT CARDIOVERSION N/A 02/20/2017   Procedure: TRANSESOPHAGEAL ECHOCARDIOGRAM (TEE);  Surgeon: Grace Isaac, MD;  Location: [facility];  Service: Open Heart Surgery;  Laterality: N/A;    Family History  Problem Relation Age of Onset  . Alzheimer's disease Mother   . Kidney cancer Mother   . Heart attack Father   . Pancreatic cancer Sister   . Heart attack Brother 73  . Ovarian cancer Sister   . Kidney disease Sister        dialysis  . Cancer  Sister        uterin  . Diabetes Sister   . Heart attack Sister   . Heart attack Brother 50  . Ovarian cancer Sister    Social History:  reports that she quit smoking about 43 years ago. She has never used smokeless tobacco. She reports that she does not drink alcohol or use drugs.  Allergies:  Allergies  Allergen Reactions  . Cephalexin Anaphylaxis  . Atorvastatin   . Morphine And Related Hives  . Procaine Other (See Comments)    Chest pain Chest pain  . Shellfish Allergy Swelling    angioedema  . Statins  Other (See Comments)    Muscle pain  . Tomato Other (See Comments)    (by testing) - also beans, wheat, peas (by testing) - also beans, wheat, peas  . Penicillins Hives and Rash    Has patient had a PCN reaction causing immediate rash, facial/tongue/throat swelling, SOB or lightheadedness with hypotension: Yes Has patient had a PCN reaction causing severe rash involving mucus membranes or skin necrosis: No Has patient had a PCN reaction that required hospitalization: No Has patient had a PCN reaction occurring within the last 10 years: Yes If all of the above answers are "NO", then may proceed with Cephalosporin use.    Prior to Admission medications   Medication Sig Start Date End Date Taking? Authorizing Provider  acetaminophen (TYLENOL) 325 MG tablet Take 2 tablets (650 mg total) by mouth every 6 (six) hours as needed for mild pain. 02/28/17  Yes Lars Pinks M, PA-C  albuterol (VENTOLIN HFA) 108 (90 Base) MCG/ACT inhaler INHALE 2 PUFFS BY MOUTH EVERY 4 TO 6 HOURS AS NEEDED FOR SHORTNESS OF BREATH 08/15/18  Yes [name], MD  amLODipine (NORVASC) 5 MG tablet Take 1 tablet (5 mg total) daily by mouth. 04/04/17  Yes [name], MD  apixaban (ELIQUIS) 5 MG TABS tablet Take 1 tablet (5 mg total) by mouth 2 (two) times daily. 10/24/17  Yes Gladstone Lighter, MD  ascorbic acid (VITAMIN C) 500 MG tablet Take 500 mg by mouth daily.   Yes [provider]  aspirin 81 MG tablet Take 1 tablet (81 mg total) by mouth daily. 10/24/17  Yes Gladstone Lighter, MD  buPROPion (WELLBUTRIN XL) 300 MG 24 hr tablet TAKE 1 TABLET BY MOUTH DAILY 09/13/18  Yes [name], MD  Cholecalciferol (VITAMIN D) 2000 units tablet Take 2,000 Units by mouth daily.   Yes [provider]  Cyanocobalamin (B-12) 500 MCG TABS Take 500 mcg by mouth daily.    Yes [provider]  diazepam (VALIUM) 5 MG tablet Take 1 tablet (5 mg total) by mouth every 12 (twelve) hours  as needed for anxiety. 08/30/17  Yes [name], MD  EPINEPHrine (EPIPEN 2-PAK) 0.3 mg/0.3 mL IJ SOAJ injection INJECT AS DIRECTED FOR SEVERE ALLERGIC REACTION Patient taking differently: Inject 0.3 mg into the muscle as needed (for severe allergic reaction).  12/17/16  Yes Mar Daring, PA-C  fluocinonide gel (LIDEX) 4.13 % Apply 1 application topically as needed. 10/22/18  Yes [provider]  Garlic 244 MG TABS Take 500 mg by mouth daily.   Yes [provider]  hydrochlorothiazide (HYDRODIURIL) 12.5 MG tablet Take 1 tablet (12.5 mg total) by mouth daily. 07/19/18 07/19/19 Yes [name], MD  levothyroxine (SYNTHROID, LEVOTHROID) 100 MCG tablet TAKE 1 TABLET BY MOUTH EVERY DAY 05/09/18  Yes [name],  MD  lisinopril (PRINIVIL,ZESTRIL) 40 MG tablet TAKE 1 TABLET BY MOUTH EVERY DAY 06/13/18  Yes [name], MD  metoprolol tartrate (LOPRESSOR) 25 MG tablet Take 1 1/2 tablets twice a day 08/22/18  Yes [name], MD  Multiple Vitamin (MULTI-VITAMINS) TABS Take 1 tablet by mouth daily.   Yes [provider]  nystatin (MYCOSTATIN) 100000 UNIT/ML suspension Take 100,000 Units by mouth 3 (three) times daily as needed. For mouth ulcers. 10/22/18  Yes [provider]  oxymetazoline (AFRIN) 0.05 % nasal spray Place 1 spray into both nostrils as needed for congestion.   Yes [provider]  rosuvastatin (CRESTOR) 40 MG tablet TAKE 1 TABLET BY MOUTH DAILY 10/19/18  Yes Bacigalupo, Dionne Bucy, MD  traMADol (ULTRAM) 50 MG tablet Take 1 tablet (50 mg total) by mouth every 6 (six) hours as needed for moderate pain. 02/14/18  Yes [name], MD  zolpidem (AMBIEN) 5 MG tablet Take 1 tablet (5 mg total) by mouth at bedtime as needed. for sleep 02/14/18   [name], MD     Results for orders placed or performed during the hospital encounter of 10/25/18 (from the past 48 hour(s))  Basic  metabolic panel     Status: Abnormal   Collection Time: 10/25/18 11:43 PM  Result Value Ref Range   Sodium 132 (L) 135 - 145 mmol/L   Potassium 4.2 3.5 - 5.1 mmol/L   Chloride 95 (L) 98 - 111 mmol/L   CO2 28 22 - 32 mmol/L   Glucose, Bld 125 (H) 70 - 99 mg/dL   BUN 23 8 - 23 mg/dL   Creatinine, Ser 0.92 0.44 - 1.00 mg/dL   Calcium 9.3 8.9 - 10.3 mg/dL   GFR calc non Af Amer >60 >60 mL/min   GFR calc Af Amer >60 >60 mL/min   Anion gap 9 5 - 15    Comment: Performed at [facility], [address] [place], [place] [address]  CBC     Status: None   Collection Time: 10/25/18 11:43 PM  Result Value Ref Range   WBC 7.7 4.0 - 10.5 K/uL   RBC 5.03 3.87 - 5.11 MIL/uL   Hemoglobin 14.7 12.0 - 15.0 g/dL   HCT 44.5 36.0 - 46.0 %   MCV 88.5 80.0 - 100.0 fL   MCH 29.2 26.0 - 34.0 pg   MCHC 33.0 30.0 - 36.0 g/dL   RDW 12.9 11.5 - 15.5 %   Platelets 168 150 - 400 K/uL   nRBC 0.0 0.0 - 0.2 %    Comment: Performed at [facility], [address] [place], [place] [address]  Troponin I - ONCE - STAT     Status: None   Collection Time: 10/25/18 11:43 PM  Result Value Ref Range   Troponin I <0.03 <0.03 ng/mL    Comment: Performed at [facility], [address] [place], [place] [address]  Troponin I - ONCE - STAT     Status: None   Collection Time: 10/26/18  2:20 AM  Result Value Ref Range   Troponin I <0.03 <0.03 ng/mL    Comment: Performed at [facility], [address], [place], [place] [address]  SARS Coronavirus 2 (CEPHEID - Performed in [facility] hospital lab), [facility] Order     Status: None   Collection Time: 10/26/18  2:20 AM  Result Value Ref Range   SARS Coronavirus 2 NEGATIVE NEGATIVE  Comment: (NOTE) If result is NEGATIVE SARS-CoV-2 target nucleic acids are NOT DETECTED. The SARS-CoV-2 RNA is generally detectable in upper and lower  respiratory specimens during the acute phase of infection. The lowest  concentration of SARS-CoV-2  viral copies this assay can detect is 250  copies / mL. A negative result does not preclude SARS-CoV-2 infection  and should not be used as the sole basis for treatment or other  patient management decisions.  A negative result may occur with  improper specimen collection / handling, submission of specimen other  than nasopharyngeal swab, presence of viral mutation(s) within the  areas targeted by this assay, and inadequate number of viral copies  (<250 copies / mL). A negative result must be combined with clinical  observations, patient history, and epidemiological information. If result is POSITIVE SARS-CoV-2 target nucleic acids are DETECTED. The SARS-CoV-2 RNA is generally detectable in upper and lower  respiratory specimens dur ing the acute phase of infection.  Positive  results are indicative of active infection with SARS-CoV-2.  Clinical  correlation with patient history and other diagnostic information is  necessary to determine patient infection status.  Positive results do  not rule out bacterial infection or co-infection with other viruses. If result is PRESUMPTIVE POSTIVE SARS-CoV-2 nucleic acids MAY BE PRESENT.   A presumptive positive result was obtained on the submitted specimen  and confirmed on repeat testing.  While 2019 novel coronavirus  (SARS-CoV-2) nucleic acids may be present in the submitted sample  additional confirmatory testing may be necessary for epidemiological  and / or clinical management purposes  to differentiate between  SARS-CoV-2 and other Sarbecovirus currently known to infect humans.  If clinically indicated additional testing with an alternate test  methodology [phone]) is advised. The SARS-CoV-2 RNA is generally  detectable in upper and lower respiratory sp ecimens during the acute  phase of infection. The expected result is Negative. Fact Sheet for Patients:  StrictlyIdeas.no Fact Sheet for Healthcare  Providers: BankingDealers.co.za This test is not yet approved or cleared by the [place] FDA and has been authorized for detection and/or diagnosis of SARS-CoV-2 by FDA under an Emergency Use Authorization (EUA).  This EUA will remain in effect (meaning this test can be used) for the duration of the COVID-19 declaration under Section 564(b)(1) of the Act, 21 U.S.C. section 360bbb-3(b)(1), unless the authorization is terminated or revoked sooner. Performed at [facility], [address] [place], [place] [address]    Dg Lumbar Spine Complete  Result Date: 10/25/2018 CLINICAL DATA:  Pain for 1 year.  Worsening last 2 months. EXAM: LUMBAR SPINE - COMPLETE 4+ VIEW COMPARISON:  CT 12/31/2009. FINDINGS: Paraspinal soft tissues are unremarkable. Large amount stool noted throughout colon suggesting constipation. Aortoiliac and visceral vascular calcification. Diffuse multilevel degenerative change with scoliosis concave left. 2 mm retrolisthesis L1 on L2 and L2 on L3. This is most likely degenerative. No acute bony abnormality identified. IMPRESSION: 1. Diffuse multilevel degenerative change with scoliosis concave left. 2 mm retrolisthesis L1 on L2 and L2 on L3. This most likely degenerative. No acute bony abnormality. 2. Aortoiliac and visceral atherosclerotic vascular disease. Mild ectasia of the abdominal aorta can not be excluded. Aortic ultrasound can be obtained to further evaluate. 3. Large amount of stool noted throughout the colon suggesting constipation. Electronically Signed   By: Marcello Moores  Register   On: 10/25/2018 10:52   Ct Head Wo Contrast  Result Date: 10/26/2018 CLINICAL DATA:  Altered level of consciousness. Headache and confusion. EXAM:  CT HEAD WITHOUT CONTRAST TECHNIQUE: Contiguous axial images were obtained from the base of the skull through the vertex without intravenous contrast. COMPARISON:  None. FINDINGS: Brain: No evidence of acute infarction,  hemorrhage, hydrocephalus, extra-axial collection or mass lesion/mass effect. An old right parietooccipital infarct is noted. Vascular: No hyperdense vessel or unexpected calcification. Skull: Normal. Negative for fracture or focal lesion. Sinuses/Orbits: No acute finding. Other: None. IMPRESSION: No acute intracranial abnormality. Electronically Signed   By: Constance Holster M.D.   On: 10/26/2018 01:34   US Pelvis Complete  Result Date: 10/24/2018 Patient Name: RASA DEGRAZIA DOB: [birth year] MRN: [id number] ULTRASOUND REPORT Location: [facility] Date of Service: 10/24/2018 Indications:Abnormal Uterine Bleeding Findings: The uterus is anteverted and measures 4.2 x 2.8 x 1.5 cm. Echo texture is homogenous without evidence of focal masses. The Endometrium measures 3.0 mm. Right Ovary was not visualized. Left Ovary was not visualized. Survey of the adnexa demonstrates no adnexal masses. There is no free fluid in the cul de sac. Impression: 1.Ovarys was not visualized due to large amounts of bowels in adnexal regions. Recommendations: 1.Clinical correlation with the patient's History and Physical Exam. Lillia Dallas, RDMS Images reviewed.  Normal GYN study without visualized pathology.  Malachy Mood, MD, [facility], [facility]   Dg Chest Portable 1 View  Result Date: 10/26/2018 CLINICAL DATA:  Chest pain EXAM: PORTABLE CHEST 1 VIEW COMPARISON:  10/22/2017 FINDINGS: The cardiac size is stable from prior study. The patient is status post prior median sternotomy. Aortic calcifications are noted. There is no pneumothorax or large pleural effusion. There is no evidence of a displaced rib fracture. There is atelectasis versus scarring at the lung bases. IMPRESSION: No active disease. Electronically Signed   By: Constance Holster M.D.   On: 10/26/2018 01:16   Dg Hip Unilat With Pelvis 2-3 Views Right  Result Date: 10/25/2018 CLINICAL DATA:  Right hip pain. EXAM: DG HIP (WITH OR  WITHOUT PELVIS) 2-3V RIGHT COMPARISON:  No recent prior. FINDINGS: Degenerative changes lumbar spine and both hips. No acute bony or joint abnormality identified. No evidence of fracture dislocation. Corticated tiny lucency noted the right femoral neck most likely a small cyst. Peripheral vascular calcification. Pelvic calcifications consistent phleboliths. IMPRESSION: 1. Degenerative changes lumbar spine and both hips. No acute bony abnormality. Tiny corticated lucency noted over the right femoral neck, most likely a benign cyst. No acute bony abnormality. 2.  Peripheral vascular disease. Electronically Signed   By: Marcello Moores  Register   On: 10/25/2018 10:58    Review of Systems  Constitutional: Negative for chills and fever.  HENT: Negative for sore throat and tinnitus.   Eyes: Negative for blurred vision and redness.  Respiratory: Negative for cough and shortness of breath.   Cardiovascular: Positive for chest pain. Negative for palpitations, orthopnea and PND.  Gastrointestinal: Negative for abdominal pain, diarrhea, nausea and vomiting.  Genitourinary: Negative for dysuria, frequency and urgency.  Musculoskeletal: Negative for joint pain and myalgias.  Skin: Negative for rash.       No lesions  Neurological: Negative for speech change, focal weakness and weakness.  Endo/Heme/Allergies: Does not bruise/bleed easily.       No temperature intolerance  Psychiatric/Behavioral: Negative for depression and suicidal ideas.    Blood pressure (!) 147/72, pulse 73, temperature 97.7 F (36.5 C), temperature source Oral, resp. rate 20, height 5\' 2"  (1.575 m), weight 77.1 kg, SpO2 96 %. Physical Exam  Vitals reviewed. Constitutional: She is oriented to person,  place, and time. She appears well-developed and well-nourished. No distress.  HENT:  Head: Normocephalic and atraumatic.  Mouth/Throat: Oropharynx is clear and moist.  Eyes: Pupils are equal, round, and reactive to light. Conjunctivae and EOM  are normal. No scleral icterus.  Neck: Normal range of motion. Neck supple. No JVD present. No tracheal deviation present. No thyromegaly present.  Cardiovascular: Normal rate, regular rhythm and normal heart sounds. Exam reveals no gallop and no friction rub.  No murmur heard. Respiratory: Effort normal and breath sounds normal.  GI: Soft. Bowel sounds are normal. She exhibits no distension. There is no abdominal tenderness.  Genitourinary:    Genitourinary Comments: Deferred   Musculoskeletal: Normal range of motion.        General: No edema.  Lymphadenopathy:    She has no cervical adenopathy.  Neurological: She is alert and oriented to person, place, and time. No cranial nerve deficit. She exhibits normal muscle tone.  Skin: Skin is warm and dry. No rash noted. No erythema.  Psychiatric: She has a normal mood and affect. Her behavior is normal. Judgment and thought content normal.     Assessment/Plan This is a 77 year old female admitted for chest pain. 1.  Chest pain: Atypical; troponin negative so far and EKG shows atrial fibrillation with minimal ST depression in some inferior leads.  Continue to follow cardiac biomarkers and monitor telemetry.  Consult cardiology to rule out ischemia. 2.  CAD: Stable per laboratory evaluation.  Continue aspirin. 3.  Hypertension: Uncontrolled; continue amlodipine, hydrochlorothiazide and lisinopril.  Hydralazine as needed 4.  Atrial fibrillation: Rate controlled; continue metoprolol and Eliquis for anticoagulation 5.  Hypothyroidism: Continue Synthroid.  Check TSH 6.  Mood disorder: Stable; continue Wellbutrin 7.  Hyperlipidemia: Continue statin therapy 8.  DVT prophylaxis: Therapeutic anticoagulation 8.  GI prophylaxis: None The patient is a full code.  Time spent on admission orders and patient care approximately 45 minutes  Harrie Foreman, MD 10/26/2018, 5:48 AM

## 2018-10-26 NOTE — ED Notes (Signed)
Pt given purse by this RN.

## 2018-10-26 NOTE — ED Notes (Signed)
Husband updated with patient's permission.

## 2018-10-26 NOTE — ED Notes (Signed)
Pt changed over to hospital bed

## 2018-10-26 NOTE — Plan of Care (Signed)
  Problem: Education: Goal: Knowledge of General Education information will improve Description Including pain rating scale, medication(s)/side effects and non-pharmacologic comfort measures Outcome: Progressing   Problem: Clinical Measurements: Goal: Ability to maintain clinical measurements within normal limits will improve Outcome: Progressing   Problem: Activity: Goal: Risk for activity intolerance will decrease Outcome: Progressing   

## 2018-10-26 NOTE — ED Notes (Signed)
Husband called and pt on phone with husband

## 2018-10-27 ENCOUNTER — Ambulatory Visit: Payer: No Typology Code available for payment source | Admitting: Obstetrics and Gynecology

## 2018-10-27 ENCOUNTER — Ambulatory Visit: Payer: Medicare Other

## 2018-10-27 DIAGNOSIS — I2510 Atherosclerotic heart disease of native coronary artery without angina pectoris: Secondary | ICD-10-CM | POA: Diagnosis not present

## 2018-10-27 DIAGNOSIS — R079 Chest pain, unspecified: Secondary | ICD-10-CM | POA: Diagnosis not present

## 2018-10-27 DIAGNOSIS — I4891 Unspecified atrial fibrillation: Secondary | ICD-10-CM | POA: Diagnosis not present

## 2018-10-27 DIAGNOSIS — R0789 Other chest pain: Secondary | ICD-10-CM | POA: Diagnosis not present

## 2018-10-27 DIAGNOSIS — I10 Essential (primary) hypertension: Secondary | ICD-10-CM | POA: Diagnosis not present

## 2018-10-27 LAB — TROPONIN I: Troponin I: <0.03 ng/mL (ref <0.03)

## 2018-10-27 MED ORDER — METOPROLOL TARTRATE 37.5 MG PO TABS: 37.5000 mg | ORAL_TABLET | Freq: Two times a day (BID) | ORAL | 0 refills | Status: DC

## 2018-10-27 NOTE — Discharge Summary (Signed)
[facility] [place], 78 y.o., DOB Jun 17, 1940, MRN [id number]. Admission date: 10/25/2018 Discharge Date 10/27/2018 Primary MD [name], MD Admitting Physician Harrie Foreman, MD  Admission Diagnosis  Chest pain, unspecified type [R07.9]  Discharge Diagnosis   Active Problems:   Chest pain atypical Coronary artery disease Essential hypertension Atrial fibrillation Hypothyroidism Mood disorder History of TIA/CVA  Hospital Course Patient 78 year old admitted with chest pain status post CABG in the past presented with complaint of chest pain.  And swimmy headed.  In the emergency room was noted to have a very high blood pressure.  Likely the result of her symptoms.  She has blood pressure was stabilized her symptoms resolved.  Cardiac enzymes remain stable.  Patient doing much better.  Currently has no symptoms.             Consults  cardiology  Significant Tests:  See full reports for all details     Dg Lumbar Spine Complete  Result Date: 10/25/2018 CLINICAL DATA:  Pain for 1 year.  Worsening last 2 months. EXAM: LUMBAR SPINE - COMPLETE 4+ VIEW COMPARISON:  CT 12/31/2009. FINDINGS: Paraspinal soft tissues are unremarkable. Large amount stool noted throughout colon suggesting constipation. Aortoiliac and visceral vascular calcification. Diffuse multilevel degenerative change with scoliosis concave left. 2 mm retrolisthesis L1 on L2 and L2 on L3. This is most likely degenerative. No acute bony abnormality identified. IMPRESSION: 1. Diffuse multilevel degenerative change with scoliosis concave left. 2 mm retrolisthesis L1 on L2 and L2 on L3. This most likely degenerative. No acute bony abnormality. 2. Aortoiliac and visceral atherosclerotic vascular disease. Mild ectasia of the abdominal aorta can not be excluded. Aortic ultrasound can be obtained to further evaluate. 3. Large amount of stool noted throughout the colon  suggesting constipation. Electronically Signed   By: Marcello Moores  Register   On: 10/25/2018 10:52   Ct Head Wo Contrast  Result Date: 10/26/2018 CLINICAL DATA:  Altered level of consciousness. Headache and confusion. EXAM: CT HEAD WITHOUT CONTRAST TECHNIQUE: Contiguous axial images were obtained from the base of the skull through the vertex without intravenous contrast. COMPARISON:  None. FINDINGS: Brain: No evidence of acute infarction, hemorrhage, hydrocephalus, extra-axial collection or mass lesion/mass effect. An old right parietooccipital infarct is noted. Vascular: No hyperdense vessel or unexpected calcification. Skull: Normal. Negative for fracture or focal lesion. Sinuses/Orbits: No acute finding. Other: None. IMPRESSION: No acute intracranial abnormality. Electronically Signed   By: Constance Holster M.D.   On: 10/26/2018 01:34   US Pelvis Complete  Result Date: 10/24/2018 Patient Name: Stacey [name] DOB: 1940-08-01 MRN: [id number] ULTRASOUND REPORT Location: [facility] Date of Service: 10/24/2018 Indications:Abnormal Uterine Bleeding Findings: The uterus is anteverted and measures 4.2 x 2.8 x 1.5 cm. Echo texture is homogenous without evidence of focal masses. The Endometrium measures 3.0 mm. Right Ovary was not visualized. Left Ovary was not visualized. Survey of the adnexa demonstrates no adnexal masses. There is no free fluid in the cul de sac. Impression: 1.Ovarys was not visualized due to large amounts of bowels in adnexal regions. Recommendations: 1.Clinical correlation with the patient's History and Physical Exam. Lillia Dallas, RDMS Images reviewed.  Normal GYN study without visualized pathology.  Malachy Mood, MD, [facility], [facility]   Dg Chest Portable 1 View  Result Date: 10/26/2018 CLINICAL DATA:  Chest pain EXAM: PORTABLE CHEST 1 VIEW COMPARISON:  10/22/2017 FINDINGS: The cardiac size is stable from prior  study. The patient is status post prior  median sternotomy. Aortic calcifications are noted. There is no pneumothorax or large pleural effusion. There is no evidence of a displaced rib fracture. There is atelectasis versus scarring at the lung bases. IMPRESSION: No active disease. Electronically Signed   By: Constance Holster M.D.   On: 10/26/2018 01:16   Dg Hip Unilat With Pelvis 2-3 Views Right  Result Date: 10/25/2018 CLINICAL DATA:  Right hip pain. EXAM: DG HIP (WITH OR WITHOUT PELVIS) 2-3V RIGHT COMPARISON:  No recent prior. FINDINGS: Degenerative changes lumbar spine and both hips. No acute bony or joint abnormality identified. No evidence of fracture dislocation. Corticated tiny lucency noted the right femoral neck most likely a small cyst. Peripheral vascular calcification. Pelvic calcifications consistent phleboliths. IMPRESSION: 1. Degenerative changes lumbar spine and both hips. No acute bony abnormality. Tiny corticated lucency noted over the right femoral neck, most likely a benign cyst. No acute bony abnormality. 2.  Peripheral vascular disease. Electronically Signed   By: Marcello Moores  Register   On: 10/25/2018 10:58       Today   Subjective:   Stacey [name] patient doing well denies any complaint  Objective:   Blood pressure 118/86, pulse 66, temperature 97.6 F (36.4 C), temperature source Oral, resp. rate 16, height 5\' 2"  (1.575 m), weight 76 kg, SpO2 96 %.  .  Intake/Output Summary (Last 24 hours) at 10/27/2018 1258 Last data filed at 10/27/2018 0900 Gross per 24 hour  Intake 240 ml  Output 400 ml  Net -160 ml    Exam VITAL SIGNS: Blood pressure 118/86, pulse 66, temperature 97.6 F (36.4 C), temperature source Oral, resp. rate 16, height 5\' 2"  (1.575 m), weight 76 kg, SpO2 96 %.  GENERAL:  78 y.o.-year-old patient lying in the bed with no acute distress.  EYES: Pupils equal, round, reactive to light and accommodation. No scleral icterus. Extraocular muscles intact.  HEENT: Head atraumatic, normocephalic.  Oropharynx and nasopharynx clear.  NECK:  Supple, no jugular venous distention. No thyroid enlargement, no tenderness.  LUNGS: Normal breath sounds bilaterally, no wheezing, rales,rhonchi or crepitation. No use of accessory muscles of respiration.  CARDIOVASCULAR: S1, S2 normal. No murmurs, rubs, or gallops.  ABDOMEN: Soft, nontender, nondistended. Bowel sounds present. No organomegaly or mass.  EXTREMITIES: No pedal edema, cyanosis, or clubbing.  NEUROLOGIC: Cranial nerves II through XII are intact. Muscle strength 5/5 in all extremities. Sensation intact. Gait not checked.  PSYCHIATRIC: The patient is alert and oriented x 3.  SKIN: No obvious rash, lesion, or ulcer.   Data Review     CBC w Diff:  Lab Results  Component Value Date   WBC 7.7 10/25/2018   HGB 14.7 10/25/2018   HGB 12.4 12/18/2016   HCT 44.5 10/25/2018   HCT 38.3 12/18/2016   PLT 168 10/25/2018   PLT 189 12/18/2016   LYMPHOPCT 24 03/21/2018   MONOPCT 6 03/21/2018   EOSPCT 4 03/21/2018   BASOPCT 1 03/21/2018   CMP:  Lab Results  Component Value Date   NA 132 (L) 10/25/2018   NA 144 07/12/2017   K 4.2 10/25/2018   CL 95 (L) 10/25/2018   CO2 28 10/25/2018   BUN 23 10/25/2018   BUN 15 07/12/2017   CREATININE 0.92 10/25/2018   CREATININE 0.93 04/29/2017   GLU 104 11/22/2013   PROT 7.0 03/21/2018   PROT 6.5 07/12/2017   ALBUMIN 4.2 03/21/2018   ALBUMIN 4.3 07/12/2017   BILITOT 0.7 03/21/2018   BILITOT  0.3 07/12/2017   ALKPHOS 73 03/21/2018   AST 26 03/21/2018   ALT 17 03/21/2018  .  Micro Results Recent Results (from the past 240 hour(s))  SARS Coronavirus 2 (CEPHEID - Performed in [facility] hospital lab), Hosp Order     Status: None   Collection Time: 10/26/18  2:20 AM  Result Value Ref Range Status   SARS Coronavirus 2 NEGATIVE NEGATIVE Final    Comment: (NOTE) If result is NEGATIVE SARS-CoV-2 target nucleic acids are NOT DETECTED. The SARS-CoV-2 RNA is generally detectable in upper and lower   respiratory specimens during the acute phase of infection. The lowest  concentration of SARS-CoV-2 viral copies this assay can detect is 250  copies / mL. A negative result does not preclude SARS-CoV-2 infection  and should not be used as the sole basis for treatment or other  patient management decisions.  A negative result may occur with  improper specimen collection / handling, submission of specimen other  than nasopharyngeal swab, presence of viral mutation(s) within the  areas targeted by this assay, and inadequate number of viral copies  (<250 copies / mL). A negative result must be combined with clinical  observations, patient history, and epidemiological information. If result is POSITIVE SARS-CoV-2 target nucleic acids are DETECTED. The SARS-CoV-2 RNA is generally detectable in upper and lower  respiratory specimens dur ing the acute phase of infection.  Positive  results are indicative of active infection with SARS-CoV-2.  Clinical  correlation with patient history and other diagnostic information is  necessary to determine patient infection status.  Positive results do  not rule out bacterial infection or co-infection with other viruses. If result is PRESUMPTIVE POSTIVE SARS-CoV-2 nucleic acids MAY BE PRESENT.   A presumptive positive result was obtained on the submitted specimen  and confirmed on repeat testing.  While 2019 novel coronavirus  (SARS-CoV-2) nucleic acids may be present in the submitted sample  additional confirmatory testing may be necessary for epidemiological  and / or clinical management purposes  to differentiate between  SARS-CoV-2 and other Sarbecovirus currently known to infect humans.  If clinically indicated additional testing with an alternate test  methodology [phone]) is advised. The SARS-CoV-2 RNA is generally  detectable in upper and lower respiratory sp ecimens during the acute  phase of infection. The expected result is Negative. Fact  Sheet for Patients:  StrictlyIdeas.no Fact Sheet for Healthcare Providers: BankingDealers.co.za This test is not yet approved or cleared by the [place] FDA and has been authorized for detection and/or diagnosis of SARS-CoV-2 by FDA under an Emergency Use Authorization (EUA).  This EUA will remain in effect (meaning this test can be used) for the duration of the COVID-19 declaration under Section 564(b)(1) of the Act, 21 U.S.C. section 360bbb-3(b)(1), unless the authorization is terminated or revoked sooner. Performed at [facility], [address], [place], [place] [address]         Code Status Orders  (From admission, onward)         Start     Ordered   10/26/18 0521  Full code  Continuous     10/26/18 0521        Code Status History    Date Active Date Inactive Code Status Order ID Comments User Context   03/21/2018 1756 03/22/2018 1826 Full Code [id number]  Bettey Costa, MD Inpatient   10/22/2017 1546 10/24/2017 1434 Full Code [id number]  Loletha Grayer, MD ED   05/22/2017 2044 05/24/2017 2139 Full Code [id number]  Isaiah Serge, NP Inpatient   02/20/2017 1909 02/28/2017 1748 Full Code [id number]  Marcene Corning Inpatient   02/20/2017 0310 02/20/2017 1237 Full Code [id number]  Saundra Shelling, MD Inpatient          Follow-up Information    [name], MD On 11/02/2018.   Specialty:  Family Medicine Why:  apointment at [facility] information: [address] [place] [address] [phone]           Discharge Medications   Allergies as of 10/27/2018      Reactions   Cephalexin Anaphylaxis   Atorvastatin    Morphine And Related Hives   Procaine Other (See Comments)   Chest pain Chest pain   Shellfish Allergy Swelling   angioedema   Statins Other (See Comments)   Muscle pain   Tomato Other (See Comments)   (by testing) - also beans, wheat, peas (by testing) -  also beans, wheat, peas   Penicillins Hives, Rash   Has patient had a PCN reaction causing immediate rash, facial/tongue/throat swelling, SOB or lightheadedness with hypotension: Yes Has patient had a PCN reaction causing severe rash involving mucus membranes or skin necrosis: No Has patient had a PCN reaction that required hospitalization: No Has patient had a PCN reaction occurring within the last 10 years: Yes If all of the above answers are "NO", then may proceed with Cephalosporin use.      Medication List    TAKE these medications   acetaminophen 325 MG tablet Commonly known as:  TYLENOL Take 2 tablets (650 mg total) by mouth every 6 (six) hours as needed for mild pain.   albuterol 108 (90 Base) MCG/ACT inhaler Commonly known as:  Ventolin HFA INHALE 2 PUFFS BY MOUTH EVERY 4 TO 6 HOURS AS NEEDED FOR SHORTNESS OF BREATH   amLODipine 5 MG tablet Commonly known as:  NORVASC Take 1 tablet (5 mg total) daily by mouth.   apixaban 5 MG Tabs tablet Commonly known as:  ELIQUIS Take 1 tablet (5 mg total) by mouth 2 (two) times daily.   ascorbic acid 500 MG tablet Commonly known as:  VITAMIN C Take 500 mg by mouth daily.   aspirin 81 MG tablet Take 1 tablet (81 mg total) by mouth daily.   B-12 500 MCG Tabs Take 500 mcg by mouth daily.   buPROPion 300 MG 24 hr tablet Commonly known as:  WELLBUTRIN XL TAKE 1 TABLET BY MOUTH DAILY   diazepam 5 MG tablet Commonly known as:  VALIUM Take 1 tablet (5 mg total) by mouth every 12 (twelve) hours as needed for anxiety.   EPINEPHrine 0.3 mg/0.3 mL Soaj injection Commonly known as:  EpiPen 2-Pak INJECT AS DIRECTED FOR SEVERE ALLERGIC REACTION What changed:    how much to take  how to take this  when to take this  reasons to take this  additional instructions   fluocinonide gel 0.05 % Commonly known as:  LIDEX Apply 1 application topically as needed.   Garlic 892 MG Tabs Take 500 mg by mouth daily.    hydrochlorothiazide 12.5 MG tablet Commonly known as:  HYDRODIURIL Take 1 tablet (12.5 mg total) by mouth daily.   levothyroxine 100 MCG tablet Commonly known as:  SYNTHROID TAKE 1 TABLET BY MOUTH EVERY DAY   lisinopril 40 MG tablet Commonly known as:  ZESTRIL TAKE 1 TABLET BY MOUTH EVERY DAY   Metoprolol Tartrate 37.5 MG Tabs Take 37.5 mg by mouth 2 (  two) times daily. What changed:    medication strength  how much to take  how to take this  when to take this  additional instructions   Multi-Vitamins Tabs Take 1 tablet by mouth daily.   nystatin 100000 UNIT/ML suspension Commonly known as:  MYCOSTATIN Take 100,000 Units by mouth 3 (three) times daily as needed. For mouth ulcers.   oxymetazoline 0.05 % nasal spray Commonly known as:  AFRIN Place 1 spray into both nostrils as needed for congestion.   rosuvastatin 40 MG tablet Commonly known as:  CRESTOR TAKE 1 TABLET BY MOUTH DAILY   traMADol 50 MG tablet Commonly known as:  ULTRAM Take 1 tablet (50 mg total) by mouth every 6 (six) hours as needed for moderate pain.   Vitamin D 50 MCG (2000 UT) tablet Take 2,000 Units by mouth daily.   zolpidem 5 MG tablet Commonly known as:  AMBIEN Take 1 tablet (5 mg total) by mouth at bedtime as needed. for sleep          Total Time in preparing paper work, data evaluation and todays exam - 35 minutes  Dustin Flock M.D on 10/27/2018 at 12:58 PM [place]  [phone]

## 2018-10-27 NOTE — Care Management Obs Status (Signed)
[facility] NOTIFICATION   Patient Details  Name: TIEN AISPURO MRN: [id number] Date of Birth: 28-May-1940   Medicare Observation Status Notification Given:  No Discharge order placed in < 24hr of being placed on observation   Katrina Stack, RN 10/27/2018, 2:14 PM

## 2018-10-31 DIAGNOSIS — E785 Hyperlipidemia, unspecified: Secondary | ICD-10-CM | POA: Diagnosis not present

## 2018-10-31 DIAGNOSIS — I10 Essential (primary) hypertension: Secondary | ICD-10-CM | POA: Diagnosis not present

## 2018-10-31 DIAGNOSIS — M791 Myalgia, unspecified site: Secondary | ICD-10-CM | POA: Diagnosis not present

## 2018-11-01 LAB — COMPREHENSIVE METABOLIC PANEL
ALT: 13 IU/L (ref 0–32)
AST: 19 IU/L (ref 0–40)
Albumin/Globulin Ratio: 2.3 — ABNORMAL HIGH (ref 1.2–2.2)
Albumin: 4.4 g/dL (ref 3.7–4.7)
Alkaline Phosphatase: 66 IU/L (ref 39–117)
BUN/Creatinine Ratio: 16 (ref 12–28)
BUN: 16 mg/dL (ref 8–27)
Bilirubin Total: 0.5 mg/dL (ref 0.0–1.2)
CO2: 24 mmol/L (ref 20–29)
Calcium: 9.3 mg/dL (ref 8.7–10.3)
Chloride: 95 mmol/L — ABNORMAL LOW (ref 96–106)
Creatinine, Ser: 1.01 mg/dL — ABNORMAL HIGH (ref 0.57–1.00)
GFR calc Af Amer: 62 mL/min/{1.73_m2} (ref >59)
GFR calc non Af Amer: 54 mL/min/{1.73_m2} — ABNORMAL LOW (ref >59)
Globulin, Total: 1.9 g/dL (ref 1.5–4.5)
Glucose: 109 mg/dL — ABNORMAL HIGH (ref 65–99)
Potassium: 4.3 mmol/L (ref 3.5–5.2)
Sodium: 138 mmol/L (ref 134–144)
Total Protein: 6.3 g/dL (ref 6.0–8.5)

## 2018-11-01 LAB — LIPID PANEL
Chol/HDL Ratio: 2.7 ratio (ref 0.0–4.4)
Cholesterol, Total: 150 mg/dL (ref 100–199)
HDL: 55 mg/dL (ref >39)
LDL Calculated: 69 mg/dL (ref 0–99)
Triglycerides: 128 mg/dL (ref 0–149)
VLDL Cholesterol Cal: 26 mg/dL (ref 5–40)

## 2018-11-01 LAB — SEDIMENTATION RATE: Sed Rate: 3 mm/hr (ref 0–40)

## 2018-11-01 LAB — CK: Total CK: 51 U/L (ref 32–182)

## 2018-11-02 ENCOUNTER — Other Ambulatory Visit: Payer: Self-pay

## 2018-11-02 ENCOUNTER — Ambulatory Visit (INDEPENDENT_AMBULATORY_CARE_PROVIDER_SITE_OTHER): Payer: Medicare Other | Admitting: Family Medicine

## 2018-11-02 ENCOUNTER — Encounter: Payer: Self-pay | Admitting: Family Medicine

## 2018-11-02 VITALS — BP 136/72 | HR 60 | Temp 98.2°F | Resp 16 | Ht 62.0 in | Wt 172.0 lb

## 2018-11-02 DIAGNOSIS — G4701 Insomnia due to medical condition: Secondary | ICD-10-CM | POA: Diagnosis not present

## 2018-11-02 DIAGNOSIS — R079 Chest pain, unspecified: Secondary | ICD-10-CM | POA: Diagnosis not present

## 2018-11-02 DIAGNOSIS — M160 Bilateral primary osteoarthritis of hip: Secondary | ICD-10-CM | POA: Diagnosis not present

## 2018-11-02 DIAGNOSIS — I25709 Atherosclerosis of coronary artery bypass graft(s), unspecified, with unspecified angina pectoris: Secondary | ICD-10-CM | POA: Diagnosis not present

## 2018-11-02 DIAGNOSIS — F411 Generalized anxiety disorder: Secondary | ICD-10-CM

## 2018-11-02 DIAGNOSIS — M5137 Other intervertebral disc degeneration, lumbosacral region: Secondary | ICD-10-CM | POA: Diagnosis not present

## 2018-11-02 MED ORDER — SERTRALINE HCL 25 MG PO TABS: 25.0000 mg | ORAL_TABLET | Freq: Every day | ORAL | 11 refills | Status: DC

## 2018-11-02 NOTE — Progress Notes (Signed)
Patient: Stacey [name] Female    DOB: 04-24-1941   78 y.o.   MRN: [id number] Visit Date: 11/02/2018  Today's Provider: Wilhemena Durie, MD   Chief Complaint  Patient presents with  . ER follow up   Subjective:     HPI   Follow up ER visit  Patient was seen in ER for chest pain on 10/25/2018. She was treated for chest pain.Pt ruled out for MI. Treatment for this included no changes, continue to monitor.  She reports good compliance with treatment. She reports this condition is Improved.  She has chronic back pain,anxiety and insomnia. Allergies  Allergen Reactions  . Cephalexin Anaphylaxis  . Atorvastatin   . Morphine And Related Hives  . Procaine Other (See Comments)    Chest pain Chest pain  . Shellfish Allergy Swelling    angioedema  . Statins Other (See Comments)    Muscle pain  . Tomato Other (See Comments)    (by testing) - also beans, wheat, peas (by testing) - also beans, wheat, peas  . Penicillins Hives and Rash    Has patient had a PCN reaction causing immediate rash, facial/tongue/throat swelling, SOB or lightheadedness with hypotension: Yes Has patient had a PCN reaction causing severe rash involving mucus membranes or skin necrosis: No Has patient had a PCN reaction that required hospitalization: No Has patient had a PCN reaction occurring within the last 10 years: Yes If all of the above answers are "NO", then may proceed with Cephalosporin use.     Current Outpatient Medications:  .  acetaminophen (TYLENOL) 325 MG tablet, Take 2 tablets (650 mg total) by mouth every 6 (six) hours as needed for mild pain., Disp: , Rfl:  .  albuterol (VENTOLIN HFA) 108 (90 Base) MCG/ACT inhaler, INHALE 2 PUFFS BY MOUTH EVERY 4 TO 6 HOURS AS NEEDED FOR SHORTNESS OF BREATH, Disp: 18 Inhaler, Rfl: 5 .  amLODipine (NORVASC) 5 MG tablet, Take 1 tablet (5 mg total) daily by mouth., Disp: 90 tablet, Rfl: 3 .  apixaban (ELIQUIS) 5 MG TABS tablet, Take 1 tablet (5  mg total) by mouth 2 (two) times daily., Disp: 60 tablet, Rfl: 2 .  ascorbic acid (VITAMIN C) 500 MG tablet, Take 500 mg by mouth daily., Disp: , Rfl:  .  aspirin 81 MG tablet, Take 1 tablet (81 mg total) by mouth daily., Disp: 30 tablet, Rfl: 2 .  buPROPion (WELLBUTRIN XL) 300 MG 24 hr tablet, TAKE 1 TABLET BY MOUTH DAILY, Disp: 90 tablet, Rfl: 3 .  Cholecalciferol (VITAMIN D) 2000 units tablet, Take 2,000 Units by mouth daily., Disp: , Rfl:  .  Cyanocobalamin (B-12) 500 MCG TABS, Take 500 mcg by mouth daily. , Disp: , Rfl:  .  diazepam (VALIUM) 5 MG tablet, Take 1 tablet (5 mg total) by mouth every 12 (twelve) hours as needed for anxiety., Disp: 60 tablet, Rfl: 1 .  EPINEPHrine (EPIPEN 2-PAK) 0.3 mg/0.3 mL IJ SOAJ injection, INJECT AS DIRECTED FOR SEVERE ALLERGIC REACTION (Patient taking differently: Inject 0.3 mg into the muscle as needed (for severe allergic reaction). ), Disp: 1 Device, Rfl: 1 .  fluocinonide gel (LIDEX) 7.85 %, Apply 1 application topically as needed., Disp: , Rfl:  .  Garlic 885 MG TABS, Take 500 mg by mouth daily., Disp: , Rfl:  .  hydrochlorothiazide (HYDRODIURIL) 12.5 MG tablet, Take 1 tablet (12.5 mg total) by mouth daily., Disp: 30 tablet, Rfl: 11 .  levothyroxine (SYNTHROID, LEVOTHROID)  100 MCG tablet, TAKE 1 TABLET BY MOUTH EVERY DAY, Disp: 90 tablet, Rfl: 3 .  lisinopril (PRINIVIL,ZESTRIL) 40 MG tablet, TAKE 1 TABLET BY MOUTH EVERY DAY, Disp: 90 tablet, Rfl: 2 .  metoprolol tartrate 37.5 MG TABS, Take 37.5 mg by mouth 2 (two) times daily., Disp: 60 tablet, Rfl: 0 .  Multiple Vitamin (MULTI-VITAMINS) TABS, Take 1 tablet by mouth daily., Disp: , Rfl:  .  nystatin (MYCOSTATIN) 100000 UNIT/ML suspension, Take 100,000 Units by mouth 3 (three) times daily as needed. For mouth ulcers., Disp: , Rfl:  .  oxymetazoline (AFRIN) 0.05 % nasal spray, Place 1 spray into both nostrils as needed for congestion., Disp: , Rfl:  .  rosuvastatin (CRESTOR) 40 MG tablet, TAKE 1 TABLET BY  MOUTH DAILY, Disp: 30 tablet, Rfl: 2 .  traMADol (ULTRAM) 50 MG tablet, Take 1 tablet (50 mg total) by mouth every 6 (six) hours as needed for moderate pain., Disp: 90 tablet, Rfl: 3 .  zolpidem (AMBIEN) 5 MG tablet, Take 1 tablet (5 mg total) by mouth at bedtime as needed. for sleep, Disp: 30 tablet, Rfl: 3  Review of Systems  Constitutional: Negative for activity change, appetite change, chills, diaphoresis and unexpected weight change.  HENT: Negative.   Eyes: Negative.   Respiratory: Negative for cough and shortness of breath.   Cardiovascular: Negative for chest pain, palpitations and leg swelling.  Gastrointestinal: Negative for abdominal pain and nausea.  Endocrine: Negative.   Musculoskeletal: Positive for back pain. Negative for arthralgias and myalgias.  Allergic/Immunologic: Negative for environmental allergies.  Neurological: Negative for dizziness, light-headedness and headaches.  Psychiatric/Behavioral: Positive for sleep disturbance. The patient is nervous/anxious.     Social History   Tobacco Use  . Smoking status: Former Smoker    Last attempt to quit: 05/26/1975    Years since quitting: 43.4  . Smokeless tobacco: Never Used  . Tobacco comment: quit 45  years ago   Substance Use Topics  . Alcohol use: No      Objective:   BP 136/72 (BP Location: Left Arm, Patient Position: Sitting, Cuff Size: Normal)   Pulse 60   Temp 98.2 F (36.8 C)   Resp 16   Ht 5\' 2"  (1.575 m)   Wt 172 lb (78 kg)   SpO2 98%   BMI 31.46 kg/m  Vitals:   11/02/18 1018  BP: 136/72  Pulse: 60  Resp: 16  Temp: 98.2 F (36.8 C)  SpO2: 98%  Weight: 172 lb (78 kg)  Height: 5\' 2"  (1.575 m)     Physical Exam Vitals signs reviewed.  Constitutional:      Appearance: She is well-developed.  HENT:     Head: Normocephalic and atraumatic.     Right Ear: External ear normal.     Left Ear: External ear normal.     Nose: Nose normal.  Eyes:     General: No scleral icterus.     Conjunctiva/sclera: Conjunctivae normal.  Neck:     Thyroid: No thyromegaly.  Cardiovascular:     Rate and Rhythm: Normal rate and regular rhythm.     Heart sounds: Normal heart sounds.  Pulmonary:     Effort: Pulmonary effort is normal.     Breath sounds: Normal breath sounds.  Abdominal:     Palpations: Abdomen is soft.  Skin:    General: Skin is warm and dry.  Neurological:     Mental Status: She is alert and oriented to person, place, and time. Mental status  is at baseline.  Psychiatric:        Mood and Affect: Mood normal.        Behavior: Behavior normal.        Thought Content: Thought content normal.        Judgment: Judgment normal.         Assessment & Plan    1. Chest pain, unspecified type Noncardiac.  2. DDD (degenerative disc disease), lumbosacral  - Ambulatory referral to Physical Therapy  3. Osteoarthritis of both hips, unspecified osteoarthritis type  - Ambulatory referral to Physical Therapy  4. GAD (generalized anxiety disorder) Try low dose SSRI. - sertraline (ZOLOFT) 25 MG tablet; Take 1 tablet (25 mg total) by mouth daily.  Dispense: 30 tablet; Refill: 11  5. Insomnia due to medical condition Ambien 5mg  prn.    I have done the exam and reviewed the above chart and it is accurate to the best of my knowledge. Development worker, community has been used in this note in any air is in the dictation or transcription are unintentional.  Wilhemena Durie, MD  [place] [facility]

## 2018-11-03 ENCOUNTER — Telehealth: Payer: Self-pay

## 2018-11-03 NOTE — Telephone Encounter (Signed)
Patient was advised.  

## 2018-11-03 NOTE — Telephone Encounter (Signed)
-----   Message from [name], MD sent at 11/02/2018 11:58 AM EDT ----- Labs OK

## 2018-11-15 DIAGNOSIS — I25810 Atherosclerosis of coronary artery bypass graft(s) without angina pectoris: Secondary | ICD-10-CM | POA: Diagnosis not present

## 2018-11-15 DIAGNOSIS — I4819 Other persistent atrial fibrillation: Secondary | ICD-10-CM | POA: Diagnosis not present

## 2018-11-15 DIAGNOSIS — E782 Mixed hyperlipidemia: Secondary | ICD-10-CM | POA: Diagnosis not present

## 2018-11-15 DIAGNOSIS — I10 Essential (primary) hypertension: Secondary | ICD-10-CM | POA: Diagnosis not present

## 2018-11-18 ENCOUNTER — Other Ambulatory Visit: Payer: Self-pay | Admitting: Family Medicine

## 2018-11-18 DIAGNOSIS — F5101 Primary insomnia: Secondary | ICD-10-CM

## 2018-11-21 ENCOUNTER — Other Ambulatory Visit: Payer: Self-pay

## 2018-11-30 ENCOUNTER — Encounter: Payer: Self-pay | Admitting: Physical Therapy

## 2018-11-30 ENCOUNTER — Ambulatory Visit: Payer: Medicare Other | Attending: Family Medicine

## 2018-11-30 ENCOUNTER — Other Ambulatory Visit: Payer: Self-pay

## 2018-11-30 DIAGNOSIS — R262 Difficulty in walking, not elsewhere classified: Secondary | ICD-10-CM | POA: Insufficient documentation

## 2018-11-30 DIAGNOSIS — K121 Other forms of stomatitis: Secondary | ICD-10-CM | POA: Diagnosis not present

## 2018-11-30 DIAGNOSIS — M6281 Muscle weakness (generalized): Secondary | ICD-10-CM | POA: Insufficient documentation

## 2018-11-30 DIAGNOSIS — M5440 Lumbago with sciatica, unspecified side: Secondary | ICD-10-CM | POA: Diagnosis not present

## 2018-11-30 DIAGNOSIS — R682 Dry mouth, unspecified: Secondary | ICD-10-CM | POA: Diagnosis not present

## 2018-11-30 DIAGNOSIS — M62830 Muscle spasm of back: Secondary | ICD-10-CM | POA: Diagnosis not present

## 2018-11-30 DIAGNOSIS — M25551 Pain in right hip: Secondary | ICD-10-CM | POA: Insufficient documentation

## 2018-11-30 NOTE — Therapy (Signed)
[facility] [address] [place], [address] Phone: [phone]   Fax:  [phone]  Physical Therapy Evaluation  Patient Details  Name: Stacey [name] MRN: [id number] Date of Birth: [birth year] Referring Provider (PT): Miguel Aschoff   Encounter Date: 11/30/2018  PT End of Session - 11/30/18 1745    Visit Number  1    Number of Visits  18    Date for PT Re-Evaluation  01/25/19    Authorization Type  Medicare    Authorization Time Period  evaluation performed 11/30/2018-    Authorization - Visit Number  1    Authorization - Number of Visits  10    PT Start Time  9163    PT Stop Time  1845    PT Time Calculation (min)  59 min    Activity Tolerance  Patient tolerated treatment well    Behavior During Therapy  [facility] for tasks assessed/performed       Past Medical History:  Diagnosis Date  . Abnormal nuclear stress test 05/24/2017  . Arthritis   . Asthma   . Atrial fibrillation, transient ([place])   . 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 Cancer ([place])   . Complication of anesthesia   . Depression   . Family history of adverse reaction to anesthesia    most of family - PONV  . Fibromyalgia   . Glaucoma   . Headache    migraines - 1-2x/mo  . HTN (hypertension) 03/09/2007  . Hyperlipidemia LDL goal <70 03/25/1993  . Hypertension   . Motion sickness    all moving vehicles  . PONV (postoperative nausea and vomiting)   . Stroke ([place]) 2019  . Thyroid disease     Past Surgical History:  Procedure Laterality Date  . ABDOMINAL HYSTERECTOMY    . APPENDECTOMY    . BLADDER SURGERY    . CATARACT EXTRACTION W/ INTRAOCULAR LENS  IMPLANT, BILATERAL    . COLONOSCOPY WITH PROPOFOL N/A 01/17/2016   Procedure: COLONOSCOPY WITH PROPOFOL;  Surgeon: [name], MD;  Location: [facility];  Service: Endoscopy;  Laterality: N/A;  . CORONARY ARTERY BYPASS GRAFT N/A  02/20/2017   Procedure: CORONARY ARTERY BYPASS GRAFTING (CABG) x , three using left internal mammary artery to left anterior descending coronary artery and right greater saphenous vein harvested endoscopically to distal right and diagonal coronary arteries.;  Surgeon: Grace Isaac, MD;  Location: [facility];  Service: Open Heart Surgery;  Laterality: N/A;  . LEFT HEART CATH AND CORONARY ANGIOGRAPHY N/A 02/20/2017   Procedure: LEFT HEART CATH AND CORONARY ANGIOGRAPHY;  Surgeon: Isaias Cowman, MD;  Location: [facility];  Service: Cardiovascular;  Laterality: N/A;  . LEFT HEART CATH AND CORS/GRAFTS ANGIOGRAPHY N/A 05/24/2017   Procedure: LEFT HEART CATH AND CORS/GRAFTS ANGIOGRAPHY;  Surgeon: Sherren Mocha, MD;  Location: [facility];  Service: Cardiovascular;  Laterality: N/A;  . NASAL SINUS SURGERY    . POLYPECTOMY  01/17/2016   Procedure: POLYPECTOMY;  Surgeon: [name], MD;  Location: [facility];  Service: Endoscopy;;  . RIGHT OOPHORECTOMY    . TEE WITHOUT CARDIOVERSION N/A 02/20/2017   Procedure: TRANSESOPHAGEAL ECHOCARDIOGRAM (TEE);  Surgeon: Grace Isaac, MD;  Location: [facility];  Service: Open Heart Surgery;  Laterality: N/A;    There were no vitals filed for this visit.   Subjective Assessment - 11/30/18 1808  Subjective  Patient reported in the last 6 months her pain has increased with her chronic low back pain. That it impedes her ability to perform functional activities and work duties such as bending, lifting, standing, walking, homemaking abilities, etc.    Pertinent History  PMH includes atrial fibrillation, CAD, cancer ([place]), depression, fibromyalgia, HTN, HLD, CVA in 2019, history of MI with CABG in 2018. Patient has had low back pain and hip pain for years, has had therapy in the past with good results. Reported she is most limited in functional abilities, standing and walking.    Limitations  Walking;Sitting;Standing;House hold  activities;Lifting    How long can you sit comfortably?  can sit how long she wants if she often bends forward    How long can you stand comfortably?  unable to tolerated standing at all in one position    How long can you walk comfortably?  15 minutes    Diagnostic tests  Diffuse multilevel degenerative change with scoliosis concaveleft. 2 mm retrolisthesis L1 on L2 and L2 on L3. This most likelydegenerative. No acute bony abnormality.Aortoiliac and visceral atherosclerotic vascular disease. Mildectasia of the abdominal aorta can not be excluded. Aorticultrasound can be obtained to further evaluate.    Patient Stated Goals  Patient would like to decrease her pain, improve her ability to move    Currently in Pain?  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)    Multiple Pain Sites  No         12/01/18 0001  Assessment  Medical Diagnosis DDD (degenerative disc disease), OA of both hips  Referring Provider (PT) [name], Richard  Precautions  Precautions None  Balance Screen  Has the patient fallen in the past 6 months No  [place] residence  Prior Function  Level of Independence Independent  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 Unable to perform functional activities and provide childcare without pain Onset: several years ago Pain: 6/10 Present, 2/10 Best, 10/10 Worst (frequently) Aggravating factors: bending, standing, walking, dressing, bathing, lifting, twisting, transitional movements, reaching, driving Easing factors: rest, ice or cold pack Recent back trauma: No Prior history of back injury or pain: Yes Radiating pain: Yes  Numbness/Tingling: Yes Imaging: Yes ; 10/25/2018: Diffuse multilevel degenerative change with scoliosis concave left. 2 mm retrolisthesis L1 on L2 and L2 on L3. This most likely degenerative. No acute bony abnormality. Aortoiliac and visceral atherosclerotic vascular disease. Mild ectasia of the abdominal aorta can not be excluded. Aortic ultrasound can be obtained to further evaluate.   OBJECTIVE  Mental Status Patient is oriented to person, place and time.  Recent memory is intact.  Remote memory is intact.  Attention span and concentration are intact.  Expressive speech is intact.  Patient's fund of knowledge is within normal limits for educational level.  SENSATION: Light touch sensation impaired on RLE, hyposensitive diffusely except for hypersensitivity S1   MUSCULOSKELETAL: Tremor: None Bulk: Normal Tone: Normal   Posture Patient with forward head rounded shoulders and slouched in sitting, often shifting weight and changing positions due to discomfort and pain  Gait Grossly WFLs, though does demonstrate aberrant movements with transitional movements/transfers.   Palpation Patient tender to palpation along lumbar paraspinals, bilateral PSIS, R greater trochanter of hip, IT band   Strength (out of 5) R/L 4-*/4-* Hip flexion 4/4 Hip abduction seated position 4/4 Hip adduction seated position 4+/5 Knee extension 5/5 Knee flexion 4+/4+ Ankle dorsiflexion *Indicates pain   AROM (degrees) R/L (all movements include overpressure unless otherwise  stated) Lumbar forward flexion: able to reach toes unable to rise to upright positioning without significant use of hands *Lumbar extension: very minimal lumbar extension, and reported significant pain *Lumbar lateral flexion (0-25): R: 25  L: 25 *Lumbar rotation: * Hip Flexion (0-125): 100 bilaterally *Indicates pain  PROM Pt with limitations in bilateral hip ER, reported pain with ER    Repeated Movements Not tested    Muscle Length Hamstrings: mild restriction bilaterally    SPECIAL TESTS SLR: R: unclear but painful,  L:  Negative Crossed SLR: R: Negative L: Negative FABER: R: Positive for pain and limited ROM L: Positive for decreased ROM   Objective measurements completed on examination: See above findings.    ASSESSMENT Clinical Impression: Pt is a very pleasant 78 year-old female referred for low back pain and bilateral hip pain. Patient reported that her pain has increased in the last 6 months and is significantly impeding her QOL, and has experienced improvement in pain with physical therapy in the past.  PT examination revealed limitations in spine and hip ROM, LE and core strength/endurance weakness, impaired soft tissue integrity, impaired posture, mobility as well as increased pain with all motions. These limitations significantly limit the patients ability to perform activities such as squatting, standing, walking, lifting, reaching, pulling, sleeping, providing childcare, cooking , cleaning, etc. The pt will benefit from skilled PT services to address deficits, pain management, and education in preparation for self management of condition.       PT Education - 11/30/18 1744    Education provided  Yes    Education Details  HEP, condition, PT role, POC    Person(s) Educated  Patient    Methods  Explanation;Demonstration;Tactile cues;Verbal cues    Comprehension  Verbalized understanding;Returned demonstration;Need further instruction       PT Short Term Goals  - 11/30/18 1849      PT SHORT TERM GOAL #1   Title  Patient will be compliant with initial HEP in preparation for self management of condition    Baseline  to be administered next session    Time  4    Period  Weeks    Status  New    Target Date  12/28/18        PT Long Term Goals - 11/30/18 1849      PT LONG TERM GOAL #1   Title  Pt will decrease mODI scoreby at least 13 points in order demonstrate clinically significant reduction in back pain/disability.    Baseline  11/30/2018 27/50;    Time  8    Period  Weeks  Status  New    Target Date  01/25/19      PT LONG TERM GOAL #2   Title  Pt will be independent with HEP in order to improve strength and decrease back pain in order to improve pain-free function at home and work.    Baseline  to be administered next session    Time  8    Period  Weeks    Status  New    Target Date  01/25/19      PT LONG TERM GOAL #3   Title  Pt will decrease worst back pain as reported on NPRS by at least 2 points in order to demonstrate clinically significant reduction in back pain.    Baseline  worst pain 10/10 11/30/2018    Time  8    Period  Weeks    Status  New    Target Date  01/25/19      PT LONG TERM GOAL #4   Title  Pt will increase strength of by at least 1/2 MMT grade in order to demonstrate improvement in strength and function.    Baseline  see objective of eval 11/30/2018    Time  8    Period  Weeks    Status  New    Target Date  01/25/19      PT LONG TERM GOAL #5   Title  Patient will report that she is able to walk at least 48minutes with low back pain/hip pain no greater than 5/10 to improve pts tolerance to endurance activities to increase pt's overall health as well as ability to perform functional activities.    Baseline  ~22minutes 11/30/2018    Time  8    Period  Weeks    Status  New    Target Date  01/25/19             Plan - 11/30/18 1848    Clinical Impression Statement  Pt is a very pleasant 78 year-old  female referred for low back pain and bilateral hip pain. Patient reported that her pain has increased in the last 6 months and is significantly impeding her QOL, and has experienced improvement in pain with physical therapy in the past.  PT examination revealed limitations in spine and hip ROM, LE and core strength/endurance weakness, impaired soft tissue integrity, impaired posture, mobility as well as increased pain with all motions. These limitations significantly limit the patients ability to perform activities such as squatting, standing, walking, lifting, reaching, pulling, sleeping, providing childcare, cooking , cleaning, etc. The pt will benefit from skilled PT services to address deficits, pain management, and education in preparation for self management of condition.    Personal Factors and Comorbidities  Age;Time since onset of injury/illness/exacerbation;Comorbidity 3+;Profession;Past/Current Experience    Comorbidities  atrial fibrillation, CVA, MI, HTN, HLD, cancer, hypothyrodism    Examination-Activity Limitations  Bathing;Hygiene/Grooming;Squat;Stairs;Lift;Bed Mobility;Bend;Locomotion Level;Stand;Reach Overhead;Caring for Others;Carry;Transfers;Sit;Dressing;Sleep    Examination-Participation Restrictions  Church;Laundry;Shop;Cleaning;Volunteer;Community Activity;Meal Prep;Yard Work;Driving    Stability/Clinical Decision Making  Stable/Uncomplicated    Clinical Decision Making  Low    Clinical Presentation due to:  back pain, LE weakness, decreased ROM, difficulty performing functional activities    Rehab Potential  Fair    Clinical Impairments Affecting Rehab Potential  (+)motivated, previous improvement with physical therapy(-)chronic condition, pain, recent CABG x 3 02/20/2017, arthritis, HTN, CVA    PT Frequency  2x / week    PT Duration  8 weeks    PT Treatment/Interventions  Cryotherapy;Electrical Stimulation;Moist Heat;Therapeutic exercise;Therapeutic activities;Neuromuscular  re-education;Patient/family education;Manual techniques;Balance training;Canalith Repostioning;Traction;Dry needling;Joint Manipulations;Spinal Manipulations;Splinting;Taping;Energy conservation;Gait training;Stair training;Ultrasound;Functional mobility training;Passive range of motion;Vestibular;Aquatic Therapy;ADLs/Self Care Home Management    PT Next Visit Plan  pain control, exercise progression as needed    PT Home Exercise Plan  Pt reported previously from therapy she performs seated heel/toe raises, SLR, seated marching, heel slides, demonstrated good knowledge of log rolling technique    Consulted and Agree with Plan of Care  Patient       Patient will benefit from skilled therapeutic intervention in order to improve the following deficits and impairments:  Pain, Improper body mechanics, Increased muscle spasms, Decreased activity tolerance, Decreased endurance, Decreased range of motion, Decreased strength, Impaired perceived functional ability, Postural dysfunction, Decreased mobility, Difficulty walking, Impaired flexibility  Visit Diagnosis: 1. Muscle weakness (generalized)   2. Muscle spasm of back   3. Difficulty in walking, not elsewhere classified   4. Low back pain with sciatica, sciatica laterality unspecified, unspecified back pain laterality, unspecified chronicity   5. Pain in right hip        Problem List Patient Active Problem List   Diagnosis Date Noted  . Chest pain 10/26/2018  . TIA (transient ischemic attack) 03/21/2018  . CVA (cerebral vascular accident) ([place]) 10/22/2017  . Abnormal nuclear stress test 05/24/2017  . 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 PAF (paroxysmal atrial fibrillation) ([place])   . Non-STEMI (non-ST elevated myocardial infarction) ([place]) 02/20/2017  . 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 Moderate mitral insufficiency 01/11/2017  . Personal history of colonic polyps   . Benign neoplasm of ascending  colon   . Benign neoplasm of descending colon   . Disequilibrium 09/16/2015  . Panic attacks 09/11/2015  . Ganglion cyst 08/16/2015  . Hypernatremia 07/04/2015  . Upper back pain 07/04/2015  . Other osteoarthritis involving multiple joints 02/06/2015  . Clinical depression 09/27/2014  . Dry mouth 09/27/2014  . Fibrositis 09/27/2014  . LBP (low back pain) 09/27/2014  . Avitaminosis D 09/27/2014  . Decreased leukocytes 09/27/2014  . Abnormal blood sugar 05/03/2009  . Insomnia 09/29/2007  . HTN (hypertension) 03/09/2007  . Adaptation reaction 01/29/2007  . Acid reflux 10/07/2006  . Menopausal and postmenopausal disorder 10/05/2006  . Headache, migraine 10/05/2006  . Arthritis, degenerative 03/23/1994  . Adult hypothyroidism 04/15/1993  . Hyperlipidemia LDL goal <70 03/25/1993    Lieutenant Diego PT, DPT 3:17 PM,12/01/18 [facility] [address], [place], [address] Phone: [phone]   Fax:  [phone]  Name: Stacey [name] MRN: [id number] Date of Birth: [birth year]

## 2018-12-01 NOTE — Patient Instructions (Signed)
Pt instructed to add lower trunk rotations to current exercises she performs at home. See PT note for details.

## 2018-12-05 ENCOUNTER — Ambulatory Visit: Payer: Medicare Other | Admitting: Physical Therapy

## 2018-12-05 ENCOUNTER — Encounter: Payer: Self-pay | Admitting: Physical Therapy

## 2018-12-05 ENCOUNTER — Other Ambulatory Visit: Payer: Self-pay

## 2018-12-05 VITALS — BP 133/80 | HR 74

## 2018-12-05 DIAGNOSIS — M62830 Muscle spasm of back: Secondary | ICD-10-CM | POA: Diagnosis not present

## 2018-12-05 DIAGNOSIS — M25551 Pain in right hip: Secondary | ICD-10-CM

## 2018-12-05 DIAGNOSIS — R262 Difficulty in walking, not elsewhere classified: Secondary | ICD-10-CM

## 2018-12-05 DIAGNOSIS — M6281 Muscle weakness (generalized): Secondary | ICD-10-CM

## 2018-12-05 DIAGNOSIS — M5440 Lumbago with sciatica, unspecified side: Secondary | ICD-10-CM | POA: Diagnosis not present

## 2018-12-05 NOTE — Therapy (Signed)
[facility] [address] [place], [address] Phone: [phone]   Fax:  [phone]  Physical Therapy Treatment  Patient Details  Name: Stacey [name] MRN: [id number] Date of Birth: 1941/02/15 Referring Provider (PT): Miguel Aschoff   Encounter Date: 12/05/2018  PT End of Session - 12/05/18 1924    Visit Number  2    Number of Visits  18    Date for PT Re-Evaluation  01/25/19    Authorization Type  Medicare reporting period from 11/30/2018    Authorization Time Period  current cert period 12/27/4625 - 01/25/2019 (last PN: IE 11/30/2018);    Authorization - Visit Number  2    Authorization - Number of Visits  10    PT Start Time  1820    PT Stop Time  1910    PT Time Calculation (min)  50 min    Activity Tolerance  Patient tolerated treatment well;Patient limited by pain    Behavior During Therapy  [facility] for tasks assessed/performed       Past Medical History:  Diagnosis Date  . Abnormal nuclear stress test 05/24/2017  . Arthritis   . Asthma   . Atrial fibrillation, transient ([place])   . 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 Cancer ([place])   . Complication of anesthesia   . Depression   . Family history of adverse reaction to anesthesia    most of family - PONV  . Fibromyalgia   . Glaucoma   . Headache    migraines - 1-2x/mo  . HTN (hypertension) 03/09/2007  . Hyperlipidemia LDL goal <70 03/25/1993  . Hypertension   . Motion sickness    all moving vehicles  . PONV (postoperative nausea and vomiting)   . Stroke ([place]) 2019  . Thyroid disease     Past Surgical History:  Procedure Laterality Date  . ABDOMINAL HYSTERECTOMY    . APPENDECTOMY    . BLADDER SURGERY    . CATARACT EXTRACTION W/ INTRAOCULAR LENS  IMPLANT, BILATERAL    . COLONOSCOPY WITH PROPOFOL N/A 01/17/2016   Procedure: COLONOSCOPY WITH PROPOFOL;  Surgeon: [name], MD;  Location: [facility];  Service: Endoscopy;  Laterality: N/A;  . CORONARY ARTERY BYPASS GRAFT N/A 02/20/2017   Procedure: CORONARY ARTERY BYPASS GRAFTING (CABG) x , three using left internal mammary artery to left anterior descending coronary artery and right greater saphenous vein harvested endoscopically to distal right and diagonal coronary arteries.;  Surgeon: Grace Isaac, MD;  Location: [facility];  Service: Open Heart Surgery;  Laterality: N/A;  . LEFT HEART CATH AND CORONARY ANGIOGRAPHY N/A 02/20/2017   Procedure: LEFT HEART CATH AND CORONARY ANGIOGRAPHY;  Surgeon: Isaias Cowman, MD;  Location: [facility];  Service: Cardiovascular;  Laterality: N/A;  . LEFT HEART CATH AND CORS/GRAFTS ANGIOGRAPHY N/A 05/24/2017   Procedure: LEFT HEART CATH AND CORS/GRAFTS ANGIOGRAPHY;  Surgeon: Sherren Mocha, MD;  Location: [facility];  Service: Cardiovascular;  Laterality: N/A;  . NASAL SINUS SURGERY    . POLYPECTOMY  01/17/2016   Procedure: POLYPECTOMY;  Surgeon: [name], MD;  Location: [facility];  Service: Endoscopy;;  . RIGHT OOPHORECTOMY    . TEE WITHOUT CARDIOVERSION N/A 02/20/2017   Procedure: TRANSESOPHAGEAL ECHOCARDIOGRAM (TEE);  Surgeon: Grace Isaac, MD;  Location: [facility];  Service: Open Heart Surgery;  Laterality: N/A;    Vitals:  12/05/18 1909  BP: 133/80  Pulse: 74    Subjective Assessment - 12/05/18 1919    Subjective  Patient reports she currently has 8/10 LBP, thoracic spine pain, and R hip pain. States it seems worse the more she moves. She states she has been completing a long term HEP provided to her by physical therapy and occupational therapy almost 2 years ago after her heart surgery including 30 squats a day and various hip exercises/stretches and back stretches. She states they all hurt but she still does them because she doesn't want to lose her function.States her R leg is always numb due to previous vascular procedures but she sometimes gets  pain down the leg too. Currently it is to the mid thigh. States she is to take her BP 2x a day and it was okay this morning.    Pertinent History  PMH includes atrial fibrillation, CAD, cancer (skin), depression, fibromyalgia, HTN, HLD, CVA in 2019, history of MI with CABG in 2018. Patient has had low back pain and hip pain for years, has had therapy in the past with good results. Reported she is most limited in functional abilities, standing and walking.    Limitations  Walking;Sitting;Standing;House hold activities;Lifting    How long can you sit comfortably?  can sit how long she wants if she often bends forward    How long can you stand comfortably?  unable to tolerated standing at all in one position    How long can you walk comfortably?  15 minutes    Diagnostic tests  Diffuse multilevel degenerative change with scoliosis concaveleft. 2 mm retrolisthesis L1 on L2 and L2 on L3. This most likelydegenerative. No acute bony abnormality.Aortoiliac and visceral atherosclerotic vascular disease. Mildectasia of the abdominal aorta can not be excluded. Aorticultrasound can be obtained to further evaluate.    Patient Stated Goals  Patient would like to decrease her pain, improve her ability to move    Currently in Pain?  Yes    Pain Score  8     Pain Location  Back    Pain Orientation  Right;Left;Mid;Upper;Lower    Pain Onset  More than a month ago       TREATMENT:  Denies history of spinal surgery Denies sensitivity to latex Denies history of long term prednisone use  Therapeutic exercise: to centralize symptoms and improve ROM, strength, muscular endurance, and activity tolerance required for successful completion of functional activities.  - seated lumbar flexion with large clear theraball x 4 min forward to decrease pain in improve motion. Patient with ERP each rep. Felt it loosening up by end of 4 min.  - blood pressure measurement to assure safety of interventions. Found to be within safe  range.   Manual therapy: to reduce pain and tissue tension, improve range of motion, neuromodulation, in order to promote improved ability to complete functional activities.  Hooklying with moist heat applied to low back (to encourage relaxation of lumbar musculature and for patient comfort):  - R hip long axis distraction at 30 degrees flexion/abduction/ER grade II-IV 7x 30 second oscillations with some movement towards neutral and back. Patient reports it relieved some tension. Last 5 sets completed with mob belt.  - R hip joint mobilization grade II-III lateral and caudal x2x30 seconds each. Patient reports discomfort at lateral R hip.  - R PROM piriformis stretch in figure 4 position and hamstring stretch (SLR position), 3x30 seconds each position to improve hip mobility and decrease tissue tension. Patient states  uncomfortable but tolerable.   Prone with pillow under lower legs and head in cradle, arms overhead:  - STM including TrP release with skin to contact using free-up lotion over thoracic and cervical paraspinals and surrounding tissue focusing on most painful segments (bra line and small of back, R > L). Noted for trigger points and muscular tension that mildly decreased per palpation. Deep and superficial techniques used. Mild erythema noted following.  - CPA along mid thoracic to lower lumbar spine grade I-II for pain reduction and to improve joint mobility. Patient reported sore but felt good at the same time.    PT Education - 12/05/18 1923    Education provided  Yes    Education Details  Exercise purpose/form. Self management techniques. Discussed HEP    Person(s) Educated  Patient    Methods  Explanation;Demonstration;Tactile cues;Verbal cues    Comprehension  Verbalized understanding;Returned demonstration       PT Short Term Goals - 12/05/18 1938      PT SHORT TERM GOAL #1   Title  Patient will be compliant with initial HEP in preparation for self management of  condition    Baseline  continues with long term HEP from previous episodes of care.    Time  4    Period  Weeks    Status  Achieved    Target Date  12/28/18        PT Long Term Goals - 11/30/18 1849      PT LONG TERM GOAL #1   Title  Pt will decrease mODI scoreby at least 13 points in order demonstrate clinically significant reduction in back pain/disability.    Baseline  11/30/2018 27/50;    Time  8    Period  Weeks    Status  New    Target Date  01/25/19      PT LONG TERM GOAL #2   Title  Pt will be independent with HEP in order to improve strength and decrease back pain in order to improve pain-free function at home and work.    Baseline  to be administered next session    Time  8    Period  Weeks    Status  New    Target Date  01/25/19      PT LONG TERM GOAL #3   Title  Pt will decrease worst back pain as reported on NPRS by at least 2 points in order to demonstrate clinically significant reduction in back pain.    Baseline  worst pain 10/10 11/30/2018    Time  8    Period  Weeks    Status  New    Target Date  01/25/19      PT LONG TERM GOAL #4   Title  Pt will increase strength of by at least 1/2 MMT grade in order to demonstrate improvement in strength and function.    Baseline  see objective of eval 11/30/2018    Time  8    Period  Weeks    Status  New    Target Date  01/25/19      PT LONG TERM GOAL #5   Title  Patient will report that she is able to walk at least 28minutes with low back pain/hip pain no greater than 5/10 to improve pts tolerance to endurance activities to increase pt's overall health as well as ability to perform functional activities.    Baseline  ~34minutes 11/30/2018    Time  8  Period  Weeks    Status  New    Target Date  01/25/19            Plan - 12/05/18 1937    Clinical Impression Statement  Pt tolerated treatment well and requested deeper manual techniques despite temporary increased discomfort.  Following manual therapy, patient  reports her pain remains 8/10 but she felt looser and more relaxed. Mild difficulty with bed mobility due to pain. Patient demo mild erythema to posterior trunk following moist heat and manual therapy. Patient noted for good tolerance of prone position (states she sleeps prone). Reported following that her chest has not healed completely from open heart surgery in 2018. May need to discontinue CPA to thoracic spine if this is a risk - reccomend discuss with pt at next visit. Patient noted to have significant joint and muscle stiffness in lumbar and thoracic spine. Patient would benefit from continued physical therapy to address remaining impairments and functional limitations to work towards stated goals and return to PLOF or maximal functional independence.    Personal Factors and Comorbidities  Age;Time since onset of injury/illness/exacerbation;Comorbidity 3+;Profession;Past/Current Experience    Comorbidities  atrial fibrillation, CVA, MI, HTN, HLD, cancer, hypothyrodism    Examination-Activity Limitations  Bathing;Hygiene/Grooming;Squat;Stairs;Lift;Bed Mobility;Bend;Locomotion Level;Stand;Reach Overhead;Caring for Others;Carry;Transfers;Sit;Dressing;Sleep    Examination-Participation Restrictions  Church;Laundry;Shop;Cleaning;Volunteer;Community Activity;Meal Prep;Yard Work;Driving    Stability/Clinical Decision Making  Stable/Uncomplicated    Rehab Potential  Fair    Clinical Impairments Affecting Rehab Potential  (+)motivated, previous improvement with physical therapy(-)chronic condition, pain, recent CABG x 3 02/20/2017, arthritis, HTN, CVA    PT Frequency  2x / week    PT Duration  8 weeks    PT Treatment/Interventions  Cryotherapy;Electrical Stimulation;Moist Heat;Therapeutic exercise;Therapeutic activities;Neuromuscular re-education;Patient/family education;Manual techniques;Balance training;Canalith Repostioning;Traction;Dry needling;Joint Manipulations;Spinal  Manipulations;Splinting;Taping;Energy conservation;Gait training;Stair training;Ultrasound;Functional mobility training;Passive range of motion;Vestibular;Aquatic Therapy;ADLs/Self Care Home Management    PT Next Visit Plan  pain control, exercise progression as needed    PT Home Exercise Plan  Pt reported previously from therapy she performs seated heel/toe raises, SLR, seated marching, heel slides, demonstrated good knowledge of log rolling technique    Consulted and Agree with Plan of Care  Patient       Patient will benefit from skilled therapeutic intervention in order to improve the following deficits and impairments:  Pain, Improper body mechanics, Increased muscle spasms, Decreased activity tolerance, Decreased endurance, Decreased range of motion, Decreased strength, Impaired perceived functional ability, Postural dysfunction, Decreased mobility, Difficulty walking, Impaired flexibility  Visit Diagnosis: 1. Muscle weakness (generalized)   2. Muscle spasm of back   3. Difficulty in walking, not elsewhere classified   4. Low back pain with sciatica, sciatica laterality unspecified, unspecified back pain laterality, unspecified chronicity   5. Pain in right hip        Problem List Patient Active Problem List   Diagnosis Date Noted  . Chest pain 10/26/2018  . TIA (transient ischemic attack) 03/21/2018  . CVA (cerebral vascular accident) ([place]) 10/22/2017  . Abnormal nuclear stress test 05/24/2017  . 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 PAF (paroxysmal atrial fibrillation) ([place])   . Non-STEMI (non-ST elevated myocardial infarction) ([place]) 02/20/2017  . 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 Moderate mitral insufficiency 01/11/2017  . Personal history of colonic polyps   . Benign neoplasm of ascending colon   . Benign neoplasm of descending colon   . Disequilibrium 09/16/2015  . Panic attacks 09/11/2015  . Ganglion cyst 08/16/2015  .  Hypernatremia 07/04/2015  . Upper back pain 07/04/2015  . Other osteoarthritis involving multiple joints 02/06/2015  . Clinical depression 09/27/2014  . Dry mouth 09/27/2014  . Fibrositis 09/27/2014  . LBP (low back pain) 09/27/2014  . Avitaminosis D 09/27/2014  . Decreased leukocytes 09/27/2014  . Abnormal blood sugar 05/03/2009  . Insomnia 09/29/2007  . HTN (hypertension) 03/09/2007  . Adaptation reaction 01/29/2007  . Acid reflux 10/07/2006  . Menopausal and postmenopausal disorder 10/05/2006  . Headache, migraine 10/05/2006  . Arthritis, degenerative 03/23/1994  . Adult hypothyroidism 04/15/1993  . Hyperlipidemia LDL goal <70 03/25/1993    Everlean Alstrom. Graylon Good, PT, DPT 12/05/18, 7:41 PM  [facility] [address], [place], [address] Phone: [phone]   Fax:  [phone]  Name: Stacey [name] MRN: [id number] Date of Birth: 1940-11-12

## 2018-12-07 ENCOUNTER — Encounter: Payer: Self-pay | Admitting: Physical Therapy

## 2018-12-07 ENCOUNTER — Ambulatory Visit: Payer: Medicare Other | Admitting: Physical Therapy

## 2018-12-07 ENCOUNTER — Other Ambulatory Visit: Payer: Self-pay

## 2018-12-07 DIAGNOSIS — M6281 Muscle weakness (generalized): Secondary | ICD-10-CM | POA: Diagnosis not present

## 2018-12-07 DIAGNOSIS — M5440 Lumbago with sciatica, unspecified side: Secondary | ICD-10-CM | POA: Diagnosis not present

## 2018-12-07 DIAGNOSIS — M62830 Muscle spasm of back: Secondary | ICD-10-CM

## 2018-12-07 DIAGNOSIS — M25551 Pain in right hip: Secondary | ICD-10-CM | POA: Diagnosis not present

## 2018-12-07 DIAGNOSIS — R262 Difficulty in walking, not elsewhere classified: Secondary | ICD-10-CM | POA: Diagnosis not present

## 2018-12-07 NOTE — Therapy (Signed)
[facility] [address], [place], [address] Phone: [phone]   Fax:  [phone]  Physical Therapy Treatment  Patient Details  Name: Stacey [name] MRN: [id number] Date of Birth: [birth year] Referring Provider (PT): Miguel Aschoff   Encounter Date: 12/07/2018  PT End of Session - 12/07/18 1928    Visit Number  3    Number of Visits  18    Date for PT Re-Evaluation  01/25/19    Authorization Type  Medicare reporting period from 11/30/2018    Authorization Time Period  current cert period 06/28/2681 - 01/25/2019 (last PN: IE 11/30/2018);    Authorization - Visit Number  3    Authorization - Number of Visits  10    PT Start Time  1805    PT Stop Time  1845    PT Time Calculation (min)  40 min    Activity Tolerance  Patient tolerated treatment well;Patient limited by pain    Behavior During Therapy  WFL for tasks assessed/performed       Past Medical History:  Diagnosis Date  . Abnormal nuclear stress test 05/24/2017  . Arthritis   . Asthma   . Atrial fibrillation, transient ([place])   . 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 Cancer ([place])   . Complication of anesthesia   . Depression   . Family history of adverse reaction to anesthesia    most of family - PONV  . Fibromyalgia   . Glaucoma   . Headache    migraines - 1-2x/mo  . HTN (hypertension) 03/09/2007  . Hyperlipidemia LDL goal <70 03/25/1993  . Hypertension   . Motion sickness    all moving vehicles  . PONV (postoperative nausea and vomiting)   . Stroke ([place]) 2019  . Thyroid disease     Past Surgical History:  Procedure Laterality Date  . ABDOMINAL HYSTERECTOMY    . APPENDECTOMY    . BLADDER SURGERY    . CATARACT EXTRACTION W/ INTRAOCULAR LENS  IMPLANT, BILATERAL    . COLONOSCOPY WITH PROPOFOL N/A 01/17/2016   Procedure: COLONOSCOPY WITH PROPOFOL;  Surgeon: [name], MD;  Location: [facility];  Service: Endoscopy;  Laterality: N/A;  . CORONARY ARTERY BYPASS GRAFT N/A 02/20/2017   Procedure: CORONARY ARTERY BYPASS GRAFTING (CABG) x , three using left internal mammary artery to left anterior descending coronary artery and right greater saphenous vein harvested endoscopically to distal right and diagonal coronary arteries.;  Surgeon: Grace Isaac, MD;  Location: [facility];  Service: Open Heart Surgery;  Laterality: N/A;  . LEFT HEART CATH AND CORONARY ANGIOGRAPHY N/A 02/20/2017   Procedure: LEFT HEART CATH AND CORONARY ANGIOGRAPHY;  Surgeon: Isaias Cowman, MD;  Location: [facility];  Service: Cardiovascular;  Laterality: N/A;  . LEFT HEART CATH AND CORS/GRAFTS ANGIOGRAPHY N/A 05/24/2017   Procedure: LEFT HEART CATH AND CORS/GRAFTS ANGIOGRAPHY;  Surgeon: Sherren Mocha, MD;  Location: [facility];  Service: Cardiovascular;  Laterality: N/A;  . NASAL SINUS SURGERY    . POLYPECTOMY  01/17/2016   Procedure: POLYPECTOMY;  Surgeon: [name], MD;  Location: [facility];  Service: Endoscopy;;  . RIGHT OOPHORECTOMY    . TEE WITHOUT CARDIOVERSION N/A 02/20/2017   Procedure: TRANSESOPHAGEAL ECHOCARDIOGRAM (TEE);  Surgeon: Grace Isaac, MD;  Location: [facility];  Service: Open Heart Surgery;  Laterality: N/A;    There were no  vitals filed for this visit.  Subjective Assessment - 12/07/18 1811    Subjective  Patient reports she has 8/10 LBP and R hip pain. She reports she was sore following last treatment session but felt it may have helped some. Had a small episode of paresthesia in R leg after she was laying on her side.    Pertinent History  PMH includes atrial fibrillation, CAD, cancer ([place]), depression, fibromyalgia, HTN, HLD, CVA in 2019, history of MI with CABG in 2018. Patient has had low back pain and hip pain for years, has had therapy in the past with good results. Reported she is most limited in functional abilities, standing and walking.     Limitations  Walking;Sitting;Standing;House hold activities;Lifting    How long can you sit comfortably?  can sit how long she wants if she often bends forward    How long can you stand comfortably?  unable to tolerated standing at all in one position    How long can you walk comfortably?  15 minutes    Diagnostic tests  Diffuse multilevel degenerative change with scoliosis concaveleft. 2 mm retrolisthesis L1 on L2 and L2 on L3. This most likelydegenerative. No acute bony abnormality.Aortoiliac and visceral atherosclerotic vascular disease. Mildectasia of the abdominal aorta can not be excluded. Aorticultrasound can be obtained to further evaluate.    Patient Stated Goals  Patient would like to decrease her pain, improve her ability to move    Currently in Pain?  Yes    Pain Score  8     Pain Location  Back    Pain Orientation  Right;Left    Pain Type  Chronic pain    Pain Radiating Towards  Right hip    Pain Onset  More than a month ago           TREATMENT:  Denies history of spinal surgery Denies sensitivity to latex Denies history of long term prednisone use  Therapeutic exercise:to centralize symptoms and improve ROM, strength, muscular endurance, and activity tolerance required for successful completion of functional activities.  - Prone hip extension with knee flexed to preferentially bias gluteus maximus. 3x5 each side. Cuing to keep knee flexed. Pt required time to learn and for transitions between sides. Repeated with knee extended 2x5. (added to HEP). Pt demo poor glute activation, and pain inhibition. R weaker than left and pt attributes to prior CVA.  - Education on HEP including handout   Manual therapy: to reduce pain and tissue tension, improve range of motion, neuromodulation, in order to promote improved ability to complete functional activities.  Hooklying with moist heat applied to low back (to encourage relaxation of lumbar musculature and for patient  comfort):  - R hip long axis distraction at 30 degrees flexion/abduction/ER grade II-IV 5x 30 second oscillations with some movement towards neutral and back. Patient reports it relieved some tension. Using mob belt. Pt reported it was painful but not bad.  - hooklying R hip AP hip mob grade II-IV 2x30 seconds. Patient reports it feels good but hurts in hip. Having a hard time describing condition.  - hooklying/supine STM to right quadriceps, hip flexors, TFL, ITB, biceps femoris, and surrounding tissue with and without IASTM assist ("the stick and LAX ball). Very tender near hip flexor and worked on some passive release in this region. Pt reports tender (acceptable to her) but feels looser following.   (removed moist heat)  - Left sidelying with R hip up: STM to lateral thigh and glutes  to decrease pain and tension. Tender but tolerable. Most tender over ITB/lateral thigh.   Prone with pillow under lower legs and head in cradle, arms overhead:  - STM including TrP release with and without contract-relax technique at R piriformis and surrounding gluteal muscles. Also applied STM to R hamstrings, QF, upper glute muscles, and gentle IASTM with "the stick" to lumbar paraspinals. To decrease pain and tension.   HOME EXERCISE PROGRAM Access Code: [email or web address]  URL: [email or web address]  Date: 12/07/2018  Prepared by: Rosita Kea   Exercises  Prone Hip Extension with Bent Knee - 3 sets - 5 reps - 2x daily    PT Education - 12/07/18 1927    Education provided  Yes    Education Details  Exercise purpose/form. Self management techniques. provided initial HEP    Person(s) Educated  Patient    Methods  Explanation;Demonstration;Tactile cues;Verbal cues;Handout    Comprehension  Verbalized understanding;Returned demonstration;Verbal cues required;Tactile cues required       PT Short Term Goals - 12/05/18 1938      PT SHORT TERM GOAL #1   Title  Patient will be compliant with initial  HEP in preparation for self management of condition    Baseline  continues with long term HEP from previous episodes of care.    Time  4    Period  Weeks    Status  Achieved    Target Date  12/28/18        PT Long Term Goals - 11/30/18 1849      PT LONG TERM GOAL #1   Title  Pt will decrease mODI scoreby at least 13 points in order demonstrate clinically significant reduction in back pain/disability.    Baseline  11/30/2018 27/50;    Time  8    Period  Weeks    Status  New    Target Date  01/25/19      PT LONG TERM GOAL #2   Title  Pt will be independent with HEP in order to improve strength and decrease back pain in order to improve pain-free function at home and work.    Baseline  to be administered next session    Time  8    Period  Weeks    Status  New    Target Date  01/25/19      PT LONG TERM GOAL #3   Title  Pt will decrease worst back pain as reported on NPRS by at least 2 points in order to demonstrate clinically significant reduction in back pain.    Baseline  worst pain 10/10 11/30/2018    Time  8    Period  Weeks    Status  New    Target Date  01/25/19      PT LONG TERM GOAL #4   Title  Pt will increase strength of by at least 1/2 MMT grade in order to demonstrate improvement in strength and function.    Baseline  see objective of eval 11/30/2018    Time  8    Period  Weeks    Status  New    Target Date  01/25/19      PT LONG TERM GOAL #5   Title  Patient will report that she is able to walk at least 68minutes with low back pain/hip pain no greater than 5/10 to improve pts tolerance to endurance activities to increase pt's overall health as well as ability to perform functional activities.    Baseline  ~54minutes  11/30/2018    Time  8    Period  Weeks    Status  New    Target Date  01/25/19            Plan - 12/07/18 1938    Clinical Impression Statement  Pt tolerated treatment with some discomfort during manual therapy and exercise that resolved when  session ended with patient reporting decrease in pain from 8/10 to 7/10. She reported feeling looser. Patient demo poor glute activation and pain inhibition. Patient was able to complete hip extensions to bias glute after intensive cuing and this was added to HEP. Pt required multimodal cuing for proper technique and to facilitate improved neuromuscular control, strength, range of motion, and functional ability resulting in improved performance and form. Patient would benefit from continued physical therapy to address remaining impairments and functional limitations to work towards stated goals and return to PLOF or maximal functional independence.    Personal Factors and Comorbidities  Age;Time since onset of injury/illness/exacerbation;Comorbidity 3+;Profession;Past/Current Experience    Comorbidities  atrial fibrillation, CVA, MI, HTN, HLD, cancer, hypothyrodism    Examination-Activity Limitations  Bathing;Hygiene/Grooming;Squat;Stairs;Lift;Bed Mobility;Bend;Locomotion Level;Stand;Reach Overhead;Caring for Others;Carry;Transfers;Sit;Dressing;Sleep    Examination-Participation Restrictions  Church;Laundry;Shop;Cleaning;Volunteer;Community Activity;Meal Prep;Yard Work;Driving    Stability/Clinical Decision Making  Stable/Uncomplicated    Rehab Potential  Fair    Clinical Impairments Affecting Rehab Potential  (+)motivated, previous improvement with physical therapy(-)chronic condition, pain, recent CABG x 3 02/20/2017, arthritis, HTN, CVA    PT Frequency  2x / week    PT Duration  8 weeks    PT Treatment/Interventions  Cryotherapy;Electrical Stimulation;Moist Heat;Therapeutic exercise;Therapeutic activities;Neuromuscular re-education;Patient/family education;Manual techniques;Balance training;Canalith Repostioning;Traction;Dry needling;Joint Manipulations;Spinal Manipulations;Splinting;Taping;Energy conservation;Gait training;Stair training;Ultrasound;Functional mobility training;Passive range of  motion;Vestibular;Aquatic Therapy;ADLs/Self Care Home Management    PT Next Visit Plan  pain control, exercise progression as needed    PT Home Exercise Plan  Medbridge Access Code: [email or web address]; Pt reports she performs seated heel/toe raises, SLR, seated marching, heel slides, demonstrated good knowledge of log rolling technique    Consulted and Agree with Plan of Care  Patient       Patient will benefit from skilled therapeutic intervention in order to improve the following deficits and impairments:  Pain, Improper body mechanics, Increased muscle spasms, Decreased activity tolerance, Decreased endurance, Decreased range of motion, Decreased strength, Impaired perceived functional ability, Postural dysfunction, Decreased mobility, Difficulty walking, Impaired flexibility  Visit Diagnosis: 1. Muscle weakness (generalized)   2. Muscle spasm of back   3. Difficulty in walking, not elsewhere classified   4. Low back pain with sciatica, sciatica laterality unspecified, unspecified back pain laterality, unspecified chronicity   5. Pain in right hip        Problem List Patient Active Problem List   Diagnosis Date Noted  . Chest pain 10/26/2018  . TIA (transient ischemic attack) 03/21/2018  . CVA (cerebral vascular accident) ([place]) 10/22/2017  . Abnormal nuclear stress test 05/24/2017  . 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 PAF (paroxysmal atrial fibrillation) ([place])   . Non-STEMI (non-ST elevated myocardial infarction) ([place]) 02/20/2017  . 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 Moderate mitral insufficiency 01/11/2017  . Personal history of colonic polyps   . Benign neoplasm of ascending colon   . Benign neoplasm of descending colon   . Disequilibrium 09/16/2015  . Panic attacks 09/11/2015  . Ganglion cyst 08/16/2015  . Hypernatremia 07/04/2015  . Upper back pain 07/04/2015  . Other osteoarthritis involving multiple joints 02/06/2015  .  Clinical  depression 09/27/2014  . Dry mouth 09/27/2014  . Fibrositis 09/27/2014  . LBP (low back pain) 09/27/2014  . Avitaminosis D 09/27/2014  . Decreased leukocytes 09/27/2014  . Abnormal blood sugar 05/03/2009  . Insomnia 09/29/2007  . HTN (hypertension) 03/09/2007  . Adaptation reaction 01/29/2007  . Acid reflux 10/07/2006  . Menopausal and postmenopausal disorder 10/05/2006  . Headache, migraine 10/05/2006  . Arthritis, degenerative 03/23/1994  . Adult hypothyroidism 04/15/1993  . Hyperlipidemia LDL goal <70 03/25/1993    Everlean Alstrom. Graylon Good, PT, DPT 12/07/18, 7:40 PM  [facility] [address] [place], [address] Phone: [phone]   Fax:  [phone]  Name: ANSHIKA PETHTEL MRN: [id number] Date of Birth: 10-21-1940

## 2018-12-12 ENCOUNTER — Other Ambulatory Visit: Payer: Self-pay

## 2018-12-12 ENCOUNTER — Ambulatory Visit: Payer: Medicare Other | Admitting: Physical Therapy

## 2018-12-12 DIAGNOSIS — M62830 Muscle spasm of back: Secondary | ICD-10-CM

## 2018-12-12 DIAGNOSIS — R262 Difficulty in walking, not elsewhere classified: Secondary | ICD-10-CM

## 2018-12-12 DIAGNOSIS — M25551 Pain in right hip: Secondary | ICD-10-CM | POA: Diagnosis not present

## 2018-12-12 DIAGNOSIS — M6281 Muscle weakness (generalized): Secondary | ICD-10-CM | POA: Diagnosis not present

## 2018-12-12 DIAGNOSIS — M5440 Lumbago with sciatica, unspecified side: Secondary | ICD-10-CM

## 2018-12-12 NOTE — Therapy (Signed)
[facility] [address], [place], [address] Phone: [phone]   Fax:  [phone]  Physical Therapy Treatment  Patient Details  Name: Stacey [name] MRN: [id number] Date of Birth: 1941/01/27 Referring Provider (PT): Miguel Aschoff   Encounter Date: 12/12/2018  PT End of Session - 12/13/18 2050    Visit Number  4    Number of Visits  18    Date for PT Re-Evaluation  01/25/19    Authorization Type  Medicare reporting period from 11/30/2018    Authorization Time Period  current cert period 06/29/3662 - 01/25/2019 (last PN: IE 11/30/2018);    Authorization - Visit Number  4    Authorization - Number of Visits  10    PT Start Time  1805    PT Stop Time  1845    PT Time Calculation (min)  40 min    Activity Tolerance  Patient tolerated treatment well;Patient limited by pain    Behavior During Therapy  [facility] for tasks assessed/performed       Past Medical History:  Diagnosis Date  . Abnormal nuclear stress test 05/24/2017  . Arthritis   . Asthma   . Atrial fibrillation, transient ([place])   . 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 Cancer ([place])   . Complication of anesthesia   . Depression   . Family history of adverse reaction to anesthesia    most of family - PONV  . Fibromyalgia   . Glaucoma   . Headache    migraines - 1-2x/mo  . HTN (hypertension) 03/09/2007  . Hyperlipidemia LDL goal <70 03/25/1993  . Hypertension   . Motion sickness    all moving vehicles  . PONV (postoperative nausea and vomiting)   . Stroke ([place]) 2019  . Thyroid disease     Past Surgical History:  Procedure Laterality Date  . ABDOMINAL HYSTERECTOMY    . APPENDECTOMY    . BLADDER SURGERY    . CATARACT EXTRACTION W/ INTRAOCULAR LENS  IMPLANT, BILATERAL    . COLONOSCOPY WITH PROPOFOL N/A 01/17/2016   Procedure: COLONOSCOPY WITH PROPOFOL;  Surgeon: [name], MD;  Location: [facility];  Service: Endoscopy;  Laterality: N/A;  . CORONARY ARTERY BYPASS GRAFT N/A 02/20/2017   Procedure: CORONARY ARTERY BYPASS GRAFTING (CABG) x , three using left internal mammary artery to left anterior descending coronary artery and right greater saphenous vein harvested endoscopically to distal right and diagonal coronary arteries.;  Surgeon: Grace Isaac, MD;  Location: [facility];  Service: Open Heart Surgery;  Laterality: N/A;  . LEFT HEART CATH AND CORONARY ANGIOGRAPHY N/A 02/20/2017   Procedure: LEFT HEART CATH AND CORONARY ANGIOGRAPHY;  Surgeon: Isaias Cowman, MD;  Location: [facility];  Service: Cardiovascular;  Laterality: N/A;  . LEFT HEART CATH AND CORS/GRAFTS ANGIOGRAPHY N/A 05/24/2017   Procedure: LEFT HEART CATH AND CORS/GRAFTS ANGIOGRAPHY;  Surgeon: Sherren Mocha, MD;  Location: [facility];  Service: Cardiovascular;  Laterality: N/A;  . NASAL SINUS SURGERY    . POLYPECTOMY  01/17/2016   Procedure: POLYPECTOMY;  Surgeon: [name], MD;  Location: [facility];  Service: Endoscopy;;  . RIGHT OOPHORECTOMY    . TEE WITHOUT CARDIOVERSION N/A 02/20/2017   Procedure: TRANSESOPHAGEAL ECHOCARDIOGRAM (TEE);  Surgeon: Grace Isaac, MD;  Location: [facility];  Service: Open Heart Surgery;  Laterality: N/A;    There were no  vitals filed for this visit.  Subjective Assessment - 12/12/18 1809    Subjective  Patient reports she has 9/10 pain mostly in the R hip radiating down the leg to mid thigh and in the low back. She states she had severe vertigo with vomiting this weekend and spent most of the time in the bed, which she thinks may have made her hip hurt more than usual. She doesn't know why her vertigo started. There was no provocative factor for the vertigo that she can think of. She moved her head in several positions and it has now resolved. She reports she felt better for a while following last treatment session and was a bit sore the following  day but she felt that was likely due to the new exercises (prone hip extension) that she did last session.    Pertinent History  PMH includes atrial fibrillation, CAD, cancer (skin), depression, fibromyalgia, HTN, HLD, CVA in 2019, history of MI with CABG in 2018. Patient has had low back pain and hip pain for years, has had therapy in the past with good results. Reported she is most limited in functional abilities, standing and walking.    Limitations  Walking;Sitting;Standing;House hold activities;Lifting    How long can you sit comfortably?  can sit how long she wants if she often bends forward    How long can you stand comfortably?  unable to tolerated standing at all in one position    How long can you walk comfortably?  15 minutes    Diagnostic tests  Diffuse multilevel degenerative change with scoliosis concaveleft. 2 mm retrolisthesis L1 on L2 and L2 on L3. This most likelydegenerative. No acute bony abnormality.Aortoiliac and visceral atherosclerotic vascular disease. Mildectasia of the abdominal aorta can not be excluded. Aorticultrasound can be obtained to further evaluate.    Patient Stated Goals  Patient would like to decrease her pain, improve her ability to move    Currently in Pain?  Yes    Pain Score  9     Pain Location  Hip    Pain Orientation  Right    Pain Type  Chronic pain    Pain Radiating Towards  right hip and back    Pain Onset  More than a month ago    Pain Frequency  Constant             TREATMENT: Denies history of spinal surgery Denies sensitivity to latex Denies history of long term prednisone use  Therapeutic exercise:to centralize symptoms and improve ROM, strength, muscular endurance, and activity tolerance required for successful completion of functional activities. - NuStep level 1 using bilateral upper and lower extremities. . For improved extremity mobility, muscular endurance, and activity tolerance; and to induce the analgesic effect of  aerobic exercise, stimulate improved joint nutrition, and prepare body structures and systems for following interventions. X 5  Min during subjective exam. - Prone hip extension with knee flexed to preferentially bias gluteus maximus. 4x5 each side. Cuing to keep knee flexed. Pt required time to learn and for transitions between sides. Great difficulty with neuromuscular control and unable to lift knee off table without assistance from clinician who provided AAROM, improved with repetition.  - prone isometric hip ER against pink theraball between legs, 3x10 with 5 second holds, clinician facilitated movement. - prone hip AROM IR x 10 to help pateint feel end range IR. Required AAROM to get much past neutral.  - prone hip IR against band, mostly isometric, with facilitation  from clinician to achieve good contraction and movement. VERY difficult for patient but not very painful.   Manual therapy:to reduce pain and tissue tension, improve range of motion, neuromodulation, in order to promote improved ability to complete functional activities. Prone with pillow under lower legs and head in cradle, arms overhead:  - STM including TrP release with and without contract-relax technique at R piriformis and surrounding gluteal muscles. Also applied STM to R hamstrings, QF, upper glute muscles, and gentle IASTM with "the stick" to lumbar paraspinals. To decrease pain and tension.   HOME EXERCISE PROGRAM Access Code: [email or web address]  URL: [email or web address]  Date: 12/07/2018  Prepared by: Rosita Kea   Exercises   Prone Hip Extension with Bent Knee - 3 sets - 5 reps - 2x daily     PT Education - 12/13/18 2050    Education provided  Yes    Education Details  Exercise purpose/form. Self management techniques.    Person(s) Educated  Patient    Methods  Explanation;Demonstration;Tactile cues;Verbal cues    Comprehension  Verbalized understanding;Returned demonstration       PT Short Term  Goals - 12/05/18 1938      PT SHORT TERM GOAL #1   Title  Patient will be compliant with initial HEP in preparation for self management of condition    Baseline  continues with long term HEP from previous episodes of care.    Time  4    Period  Weeks    Status  Achieved    Target Date  12/28/18        PT Long Term Goals - 11/30/18 1849      PT LONG TERM GOAL #1   Title  Pt will decrease mODI scoreby at least 13 points in order demonstrate clinically significant reduction in back pain/disability.    Baseline  11/30/2018 27/50;    Time  8    Period  Weeks    Status  New    Target Date  01/25/19      PT LONG TERM GOAL #2   Title  Pt will be independent with HEP in order to improve strength and decrease back pain in order to improve pain-free function at home and work.    Baseline  to be administered next session    Time  8    Period  Weeks    Status  New    Target Date  01/25/19      PT LONG TERM GOAL #3   Title  Pt will decrease worst back pain as reported on NPRS by at least 2 points in order to demonstrate clinically significant reduction in back pain.    Baseline  worst pain 10/10 11/30/2018    Time  8    Period  Weeks    Status  New    Target Date  01/25/19      PT LONG TERM GOAL #4   Title  Pt will increase strength of by at least 1/2 MMT grade in order to demonstrate improvement in strength and function.    Baseline  see objective of eval 11/30/2018    Time  8    Period  Weeks    Status  New    Target Date  01/25/19      PT LONG TERM GOAL #5   Title  Patient will report that she is able to walk at least 45minutes with low back pain/hip pain no greater than 5/10 to improve pts tolerance to endurance  activities to increase pt's overall health as well as ability to perform functional activities.    Baseline  ~57minutes 11/30/2018    Time  8    Period  Weeks    Status  New    Target Date  01/25/19            Plan - 12/13/18 2056    Clinical Impression Statement   Patient tolerated well and was able to tolerate more activity today. She reported some relief in pain from [facility]. She demo poor neuromuscular control of hip muscles and is profoundly weak in glute activation with hip extension as well as hip IR and ER. Patient would benefit from continued physical therapy to address remaining impairments and functional limitations to work towards stated goals and return to PLOF or maximal functional independence.    Personal Factors and Comorbidities  Age;Time since onset of injury/illness/exacerbation;Comorbidity 3+;Profession;Past/Current Experience    Comorbidities  atrial fibrillation, CVA, MI, HTN, HLD, cancer, hypothyrodism    Examination-Activity Limitations  Bathing;Hygiene/Grooming;Squat;Stairs;Lift;Bed Mobility;Bend;Locomotion Level;Stand;Reach Overhead;Caring for Others;Carry;Transfers;Sit;Dressing;Sleep    Examination-Participation Restrictions  Church;Laundry;Shop;Cleaning;Volunteer;Community Activity;Meal Prep;Yard Work;Driving    Stability/Clinical Decision Making  Stable/Uncomplicated    Rehab Potential  Fair    Clinical Impairments Affecting Rehab Potential  (+)motivated, previous improvement with physical therapy(-)chronic condition, pain, recent CABG x 3 02/20/2017, arthritis, HTN, CVA    PT Frequency  2x / week    PT Duration  8 weeks    PT Treatment/Interventions  Cryotherapy;Electrical Stimulation;Moist Heat;Therapeutic exercise;Therapeutic activities;Neuromuscular re-education;Patient/family education;Manual techniques;Balance training;Canalith Repostioning;Traction;Dry needling;Joint Manipulations;Spinal Manipulations;Splinting;Taping;Energy conservation;Gait training;Stair training;Ultrasound;Functional mobility training;Passive range of motion;Vestibular;Aquatic Therapy;ADLs/Self Care Home Management    PT Next Visit Plan  continue hip strengthening as tolerated.    PT Home Exercise Plan  Medbridge Access Code: [email or web address]; Pt reports she performs  seated heel/toe raises, SLR, seated marching, heel slides, demonstrated good knowledge of log rolling technique    Consulted and Agree with Plan of Care  Patient       Patient will benefit from skilled therapeutic intervention in order to improve the following deficits and impairments:  Pain, Improper body mechanics, Increased muscle spasms, Decreased activity tolerance, Decreased endurance, Decreased range of motion, Decreased strength, Impaired perceived functional ability, Postural dysfunction, Decreased mobility, Difficulty walking, Impaired flexibility  Visit Diagnosis: 1. Muscle weakness (generalized)   2. Muscle spasm of back   3. Difficulty in walking, not elsewhere classified   4. Low back pain with sciatica, sciatica laterality unspecified, unspecified back pain laterality, unspecified chronicity   5. Pain in right hip        Problem List Patient Active Problem List   Diagnosis Date Noted  . Chest pain 10/26/2018  . TIA (transient ischemic attack) 03/21/2018  . CVA (cerebral vascular accident) ([place]) 10/22/2017  . Abnormal nuclear stress test 05/24/2017  . 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 PAF (paroxysmal atrial fibrillation) ([place])   . Non-STEMI (non-ST elevated myocardial infarction) ([place]) 02/20/2017  . 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 Moderate mitral insufficiency 01/11/2017  . Personal history of colonic polyps   . Benign neoplasm of ascending colon   . Benign neoplasm of descending colon   . Disequilibrium 09/16/2015  . Panic attacks 09/11/2015  . Ganglion cyst 08/16/2015  . Hypernatremia 07/04/2015  . Upper back pain 07/04/2015  . Other osteoarthritis involving multiple joints 02/06/2015  . Clinical depression 09/27/2014  . Dry mouth 09/27/2014  . Fibrositis 09/27/2014  . LBP (low back pain) 09/27/2014  .  Avitaminosis D 09/27/2014  . Decreased leukocytes 09/27/2014  . Abnormal blood sugar 05/03/2009  . Insomnia  09/29/2007  . HTN (hypertension) 03/09/2007  . Adaptation reaction 01/29/2007  . Acid reflux 10/07/2006  . Menopausal and postmenopausal disorder 10/05/2006  . Headache, migraine 10/05/2006  . Arthritis, degenerative 03/23/1994  . Adult hypothyroidism 04/15/1993  . Hyperlipidemia LDL goal <70 03/25/1993    Everlean Alstrom. Graylon Good, PT, DPT 12/13/18, 8:58 PM  [facility] [address], [place], [address] Phone: [phone]   Fax:  [phone]  Name: Stacey [name] MRN: [id number] Date of Birth: 08/21/1940

## 2018-12-13 ENCOUNTER — Encounter: Payer: Self-pay | Admitting: Physical Therapy

## 2018-12-14 ENCOUNTER — Other Ambulatory Visit: Payer: Self-pay

## 2018-12-14 ENCOUNTER — Ambulatory Visit: Payer: Medicare Other | Admitting: Physical Therapy

## 2018-12-14 ENCOUNTER — Encounter: Payer: Self-pay | Admitting: Physical Therapy

## 2018-12-14 DIAGNOSIS — M25551 Pain in right hip: Secondary | ICD-10-CM

## 2018-12-14 DIAGNOSIS — M62830 Muscle spasm of back: Secondary | ICD-10-CM

## 2018-12-14 DIAGNOSIS — R262 Difficulty in walking, not elsewhere classified: Secondary | ICD-10-CM

## 2018-12-14 DIAGNOSIS — M6281 Muscle weakness (generalized): Secondary | ICD-10-CM | POA: Diagnosis not present

## 2018-12-14 DIAGNOSIS — M5440 Lumbago with sciatica, unspecified side: Secondary | ICD-10-CM

## 2018-12-14 NOTE — Therapy (Signed)
[facility] [address] [place], [address] Phone: [phone]   Fax:  [phone]  Physical Therapy Treatment  Patient Details  Name: Stacey [name] MRN: [id number] Date of Birth: [birth year] Referring Provider (PT): Miguel Aschoff   Encounter Date: 12/14/2018  PT End of Session - 12/14/18 1819    Visit Number  5    Number of Visits  18    Date for PT Re-Evaluation  01/25/19    Authorization Type  Medicare reporting period from 11/30/2018    Authorization Time Period  current cert period 9/0/3009 - 01/25/2019 (last PN: IE 11/30/2018);    Authorization - Visit Number  5    Authorization - Number of Visits  10    PT Start Time  1800    PT Stop Time  1840    PT Time Calculation (min)  40 min    Activity Tolerance  Patient tolerated treatment well;Patient limited by pain    Behavior During Therapy  [facility] for tasks assessed/performed       Past Medical History:  Diagnosis Date  . Abnormal nuclear stress test 05/24/2017  . Arthritis   . Asthma   . Atrial fibrillation, transient ([place])   . 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 Cancer ([place])   . Complication of anesthesia   . Depression   . Family history of adverse reaction to anesthesia    most of family - PONV  . Fibromyalgia   . Glaucoma   . Headache    migraines - 1-2x/mo  . HTN (hypertension) 03/09/2007  . Hyperlipidemia LDL goal <70 03/25/1993  . Hypertension   . Motion sickness    all moving vehicles  . PONV (postoperative nausea and vomiting)   . Stroke ([place]) 2019  . Thyroid disease     Past Surgical History:  Procedure Laterality Date  . ABDOMINAL HYSTERECTOMY    . APPENDECTOMY    . BLADDER SURGERY    . CATARACT EXTRACTION W/ INTRAOCULAR LENS  IMPLANT, BILATERAL    . COLONOSCOPY WITH PROPOFOL N/A 01/17/2016   Procedure: COLONOSCOPY WITH PROPOFOL;  Surgeon: [name], MD;  Location: [facility];  Service: Endoscopy;  Laterality: N/A;  . CORONARY ARTERY BYPASS GRAFT N/A 02/20/2017   Procedure: CORONARY ARTERY BYPASS GRAFTING (CABG) x , three using left internal mammary artery to left anterior descending coronary artery and right greater saphenous vein harvested endoscopically to distal right and diagonal coronary arteries.;  Surgeon: Grace Isaac, MD;  Location: [facility];  Service: Open Heart Surgery;  Laterality: N/A;  . LEFT HEART CATH AND CORONARY ANGIOGRAPHY N/A 02/20/2017   Procedure: LEFT HEART CATH AND CORONARY ANGIOGRAPHY;  Surgeon: Isaias Cowman, MD;  Location: [place];  Service: Cardiovascular;  Laterality: N/A;  . LEFT HEART CATH AND CORS/GRAFTS ANGIOGRAPHY N/A 05/24/2017   Procedure: LEFT HEART CATH AND CORS/GRAFTS ANGIOGRAPHY;  Surgeon: Sherren Mocha, MD;  Location: [place] [facility];  Service: Cardiovascular;  Laterality: N/A;  . NASAL SINUS SURGERY    . POLYPECTOMY  01/17/2016   Procedure: POLYPECTOMY;  Surgeon: [name], MD;  Location: [facility];  Service: Endoscopy;;  . RIGHT OOPHORECTOMY    . TEE WITHOUT CARDIOVERSION N/A 02/20/2017   Procedure: TRANSESOPHAGEAL ECHOCARDIOGRAM (TEE);  Surgeon: Grace Isaac, MD;  Location: [facility];  Service: Open Heart Surgery;  Laterality: N/A;    There were no  vitals filed for this visit.  Subjective Assessment - 12/14/18 1804    Subjective  Patient reports she is feeling okay today with no difficulty with vertigo. She reports 8/10 pain in the R glute and low back region that is her familiar pain. States she felt a little looser following last treatment session until she tried to do something and her pain kicked up again. She did not do her HEP today due to having so much pain in her back/hip.    Pertinent History  PMH includes atrial fibrillation, CAD, cancer ([place]), depression, fibromyalgia, HTN, HLD, CVA in 2019, history of MI with CABG in 2018. Patient has had low back pain and hip  pain for years, has had therapy in the past with good results. Reported she is most limited in functional abilities, standing and walking.    Limitations  Walking;Sitting;Standing;House hold activities;Lifting    How long can you sit comfortably?  can sit how long she wants if she often bends forward    How long can you stand comfortably?  unable to tolerated standing at all in one position    How long can you walk comfortably?  15 minutes    Diagnostic tests  Diffuse multilevel degenerative change with scoliosis concaveleft. 2 mm retrolisthesis L1 on L2 and L2 on L3. This most likelydegenerative. No acute bony abnormality.Aortoiliac and visceral atherosclerotic vascular disease. Mildectasia of the abdominal aorta can not be excluded. Aorticultrasound can be obtained to further evaluate.    Patient Stated Goals  Patient would like to decrease her pain, improve her ability to move    Currently in Pain?  Yes    Pain Score  8     Pain Location  Back   hip/glute/back   Pain Orientation  Right    Pain Descriptors / Indicators  Stabbing;Aching;Spasm   catching when she bends over   Pain Type  Chronic pain    Pain Radiating Towards  right hip/glute    Pain Onset  More than a month ago    Pain Frequency  Constant        TREATMENT: Denies history of spinal surgery Denies sensitivity to latex Denies history of long term prednisone use  Therapeutic exercise:to centralize symptoms and improve ROM, strength, muscular endurance, and activity tolerance required for successful completion of functional activities. - NuStep level 1 using bilateral upper and lower extremities. Seat/handle setting 6. For improved extremity mobility, muscular endurance, and activity tolerance; and to induce the analgesic effect of aerobic exercise, stimulate improved joint nutrition, and prepare body structures and systems for following interventions. X 5  Min during subjective exam. - hooklying lower trunk rotation x 2  minutes no theraball.  To decrease tension and improve joint mobility. Repeated 3 minutes with green theraball under feet.  - hooklying double knees to chest with green theraball under feet, to decrease pain and improve joint mobility. X 3 min. - attempted modified dead bug, but unable to control abdominal muscles while moving legs.  - prone press up x10, x15. Patient states she thinks it is doing some good and feels good. Upon standing, states her pain is lower (but unable to provide numeric rating). Upon walking, feels there is a catch each step near her R SIJ that is better if she applies pressure here with her hand. Reports diffuse pain down R leg to mid thigh. Doesn't think it was there prior to starting session, but seems unperturbed by it since she has been moving. Difficulty describing pain.  -  Education on HEP including handout   HOME EXERCISE PROGRAM Access Code: Tia Alert  URL: [email or web address]  Date: 12/14/2018  Prepared by: Rosita Kea   Exercises  Prone Press Up - 10-20 reps - 3x daily - 7x weekly  Supine Transversus Abdominis Bracing - Hands on Stomach - 5 minutes time - 2x daily - 7x weekly     PT Education - 12/14/18 1819    Education provided  Yes    Education Details  Exercise purpose/form. Self management techniques.    Person(s) Educated  Patient    Methods  Explanation;Demonstration;Tactile cues;Verbal cues    Comprehension  Verbalized understanding;Returned demonstration       PT Short Term Goals - 12/05/18 1938      PT SHORT TERM GOAL #1   Title  Patient will be compliant with initial HEP in preparation for self management of condition    Baseline  continues with long term HEP from previous episodes of care.    Time  4    Period  Weeks    Status  Achieved    Target Date  12/28/18        PT Long Term Goals - 11/30/18 1849      PT LONG TERM GOAL #1   Title  Pt will decrease mODI scoreby at least 13 points in order demonstrate  clinically significant reduction in back pain/disability.    Baseline  11/30/2018 27/50;    Time  8    Period  Weeks    Status  New    Target Date  01/25/19      PT LONG TERM GOAL #2   Title  Pt will be independent with HEP in order to improve strength and decrease back pain in order to improve pain-free function at home and work.    Baseline  to be administered next session    Time  8    Period  Weeks    Status  New    Target Date  01/25/19      PT LONG TERM GOAL #3   Title  Pt will decrease worst back pain as reported on NPRS by at least 2 points in order to demonstrate clinically significant reduction in back pain.    Baseline  worst pain 10/10 11/30/2018    Time  8    Period  Weeks    Status  New    Target Date  01/25/19      PT LONG TERM GOAL #4   Title  Pt will increase strength of by at least 1/2 MMT grade in order to demonstrate improvement in strength and function.    Baseline  see objective of eval 11/30/2018    Time  8    Period  Weeks    Status  New    Target Date  01/25/19      PT LONG TERM GOAL #5   Title  Patient will report that she is able to walk at least 45minutes with low back pain/hip pain no greater than 5/10 to improve pts tolerance to endurance activities to increase pt's overall health as well as ability to perform functional activities.    Baseline  ~53minutes 11/30/2018    Time  8    Period  Weeks    Status  New    Target Date  01/25/19            Plan - 12/14/18 1857    Clinical Impression Statement  Patient tolerated treatment well with some  difficulty due to pain. She is willing to do everything asked of her today and confirms she is not afraid of pain. States she has sores in her mouth constantly from her blood pressure medication and 5 other medications that her ENT has identified as the cause. She has discussed this with her PCP who was unable to change her medications to improve this. Patient reports feeling looser and a bit better by end of  session, but it is unclear if she actually experienced a reduction in pain. She liked the prone press up activity and abdominal bracing activity, so it was added to HEP for trial over the weekend. Discontinued prone hip extension, due to difficulty performing independently and possible confounding factor with prone press up. Patient demo difficulty with motor control in the abdominal muscles coordinated with breathing and hip motion and would benefit from further exercises addressing this. Patient would benefit from continued physical therapy to address remaining impairments and functional limitations to work towards stated goals and return to PLOF or maximal functional independence.    Personal Factors and Comorbidities  Age;Time since onset of injury/illness/exacerbation;Comorbidity 3+;Profession;Past/Current Experience    Comorbidities  atrial fibrillation, CVA, MI, HTN, HLD, cancer, hypothyrodism    Examination-Activity Limitations  Bathing;Hygiene/Grooming;Squat;Stairs;Lift;Bed Mobility;Bend;Locomotion Level;Stand;Reach Overhead;Caring for Others;Carry;Transfers;Sit;Dressing;Sleep    Examination-Participation Restrictions  Church;Laundry;Shop;Cleaning;Volunteer;Community Activity;Meal Prep;Yard Work;Driving    Stability/Clinical Decision Making  Stable/Uncomplicated    Rehab Potential  Fair    Clinical Impairments Affecting Rehab Potential  (+)motivated, previous improvement with physical therapy(-)chronic condition, pain, recent CABG x 3 02/20/2017, arthritis, HTN, CVA    PT Frequency  2x / week    PT Duration  8 weeks    PT Treatment/Interventions  Cryotherapy;Electrical Stimulation;Moist Heat;Therapeutic exercise;Therapeutic activities;Neuromuscular re-education;Patient/family education;Manual techniques;Balance training;Canalith Repostioning;Traction;Dry needling;Joint Manipulations;Spinal Manipulations;Splinting;Taping;Energy conservation;Gait training;Stair training;Ultrasound;Functional mobility  training;Passive range of motion;Vestibular;Aquatic Therapy;ADLs/Self Care Home Management    PT Next Visit Plan  continue hip strengthening as tolerated.    PT Home Exercise Plan  Medbridge Access Code: [email or web address]; Pt reports she performs seated heel/toe raises, SLR, seated marching, heel slides, demonstrated good knowledge of log rolling technique    Consulted and Agree with Plan of Care  Patient       Patient will benefit from skilled therapeutic intervention in order to improve the following deficits and impairments:  Pain, Improper body mechanics, Increased muscle spasms, Decreased activity tolerance, Decreased endurance, Decreased range of motion, Decreased strength, Impaired perceived functional ability, Postural dysfunction, Decreased mobility, Difficulty walking, Impaired flexibility  Visit Diagnosis: 1. Muscle weakness (generalized)   2. Muscle spasm of back   3. Difficulty in walking, not elsewhere classified   4. Low back pain with sciatica, sciatica laterality unspecified, unspecified back pain laterality, unspecified chronicity   5. Pain in right hip        Problem List Patient Active Problem List   Diagnosis Date Noted  . Chest pain 10/26/2018  . TIA (transient ischemic attack) 03/21/2018  . CVA (cerebral vascular accident) ([place]) 10/22/2017  . Abnormal nuclear stress test 05/24/2017  . 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 PAF (paroxysmal atrial fibrillation) ([place])   . Non-STEMI (non-ST elevated myocardial infarction) ([place]) 02/20/2017  . 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 Moderate mitral insufficiency 01/11/2017  . Personal history of colonic polyps   . Benign neoplasm of ascending colon   . Benign neoplasm of descending colon   . Disequilibrium 09/16/2015  . Panic attacks 09/11/2015  . Ganglion cyst 08/16/2015  .  Hypernatremia 07/04/2015  . Upper back pain 07/04/2015  . Other osteoarthritis involving multiple joints  02/06/2015  . Clinical depression 09/27/2014  . Dry mouth 09/27/2014  . Fibrositis 09/27/2014  . LBP (low back pain) 09/27/2014  . Avitaminosis D 09/27/2014  . Decreased leukocytes 09/27/2014  . Abnormal blood sugar 05/03/2009  . Insomnia 09/29/2007  . HTN (hypertension) 03/09/2007  . Adaptation reaction 01/29/2007  . Acid reflux 10/07/2006  . Menopausal and postmenopausal disorder 10/05/2006  . Headache, migraine 10/05/2006  . Arthritis, degenerative 03/23/1994  . Adult hypothyroidism 04/15/1993  . Hyperlipidemia LDL goal <70 03/25/1993    Everlean Alstrom. Graylon Good, PT, DPT 12/14/18, 6:58 PM  [facility] [address] [place], [address] Phone: [phone]   Fax:  [phone]  Name: Stacey [name] MRN: [id number] Date of Birth: 06/19/1940

## 2018-12-19 ENCOUNTER — Other Ambulatory Visit: Payer: Self-pay | Admitting: Family Medicine

## 2018-12-19 ENCOUNTER — Ambulatory Visit: Payer: Medicare Other | Admitting: Physical Therapy

## 2018-12-19 DIAGNOSIS — E785 Hyperlipidemia, unspecified: Secondary | ICD-10-CM

## 2018-12-20 ENCOUNTER — Other Ambulatory Visit: Payer: Self-pay | Admitting: Family Medicine

## 2018-12-20 NOTE — Telephone Encounter (Signed)
[facility] called for a refill  Diazepam 5 mg   They say they are a new pharmacy for this prescription  Stacey [name]

## 2018-12-21 ENCOUNTER — Ambulatory Visit: Payer: Medicare Other | Admitting: Physical Therapy

## 2018-12-21 ENCOUNTER — Other Ambulatory Visit: Payer: Self-pay

## 2018-12-21 DIAGNOSIS — M62830 Muscle spasm of back: Secondary | ICD-10-CM

## 2018-12-21 DIAGNOSIS — R262 Difficulty in walking, not elsewhere classified: Secondary | ICD-10-CM | POA: Diagnosis not present

## 2018-12-21 DIAGNOSIS — M6281 Muscle weakness (generalized): Secondary | ICD-10-CM | POA: Diagnosis not present

## 2018-12-21 DIAGNOSIS — M25551 Pain in right hip: Secondary | ICD-10-CM

## 2018-12-21 DIAGNOSIS — M5440 Lumbago with sciatica, unspecified side: Secondary | ICD-10-CM

## 2018-12-21 MED ORDER — DIAZEPAM 5 MG PO TABS: 5.0000 mg | ORAL_TABLET | Freq: Two times a day (BID) | ORAL | 1 refills | Status: DC | PRN

## 2018-12-21 NOTE — Therapy (Signed)
[facility] [address], [place], [address] Phone: [phone]   Fax:  [phone]  Physical Therapy Treatment  Patient Details  Name: Stacey [name] MRN: [id number] Date of Birth: 12/09/1940 Referring Provider (PT): Miguel Aschoff   Encounter Date: 12/21/2018  PT End of Session - 12/21/18 1807    Visit Number  6    Number of Visits  18    Date for PT Re-Evaluation  01/25/19    Authorization Type  Medicare reporting period from 11/30/2018    Authorization Time Period  current cert period 01/25/2670 - 01/25/2019 (last PN: IE 11/30/2018);    Authorization - Visit Number  6    Authorization - Number of Visits  10    PT Start Time  1800    PT Stop Time  1840    PT Time Calculation (min)  40 min    Activity Tolerance  Patient tolerated treatment well;Patient limited by pain    Behavior During Therapy  [facility] for tasks assessed/performed       Past Medical History:  Diagnosis Date  . Abnormal nuclear stress test 05/24/2017  . Arthritis   . Asthma   . Atrial fibrillation, transient ([place])   . 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 Cancer ([place])   . Complication of anesthesia   . Depression   . Family history of adverse reaction to anesthesia    most of family - PONV  . Fibromyalgia   . Glaucoma   . Headache    migraines - 1-2x/mo  . HTN (hypertension) 03/09/2007  . Hyperlipidemia LDL goal <70 03/25/1993  . Hypertension   . Motion sickness    all moving vehicles  . PONV (postoperative nausea and vomiting)   . Stroke ([place]) 2019  . Thyroid disease     Past Surgical History:  Procedure Laterality Date  . ABDOMINAL HYSTERECTOMY    . APPENDECTOMY    . BLADDER SURGERY    . CATARACT EXTRACTION W/ INTRAOCULAR LENS  IMPLANT, BILATERAL    . COLONOSCOPY WITH PROPOFOL N/A 01/17/2016   Procedure: COLONOSCOPY WITH PROPOFOL;  Surgeon: [name], MD;  Location: [facility];  Service: Endoscopy;  Laterality: N/A;  . CORONARY ARTERY BYPASS GRAFT N/A 02/20/2017   Procedure: CORONARY ARTERY BYPASS GRAFTING (CABG) x , three using left internal mammary artery to left anterior descending coronary artery and right greater saphenous vein harvested endoscopically to distal right and diagonal coronary arteries.;  Surgeon: Grace Isaac, MD;  Location: [facility];  Service: Open Heart Surgery;  Laterality: N/A;  . LEFT HEART CATH AND CORONARY ANGIOGRAPHY N/A 02/20/2017   Procedure: LEFT HEART CATH AND CORONARY ANGIOGRAPHY;  Surgeon: Isaias Cowman, MD;  Location: [facility];  Service: Cardiovascular;  Laterality: N/A;  . LEFT HEART CATH AND CORS/GRAFTS ANGIOGRAPHY N/A 05/24/2017   Procedure: LEFT HEART CATH AND CORS/GRAFTS ANGIOGRAPHY;  Surgeon: Sherren Mocha, MD;  Location: [facility];  Service: Cardiovascular;  Laterality: N/A;  . NASAL SINUS SURGERY    . POLYPECTOMY  01/17/2016   Procedure: POLYPECTOMY;  Surgeon: [name], MD;  Location: [facility];  Service: Endoscopy;;  . RIGHT OOPHORECTOMY    . TEE WITHOUT CARDIOVERSION N/A 02/20/2017   Procedure: TRANSESOPHAGEAL ECHOCARDIOGRAM (TEE);  Surgeon: Grace Isaac, MD;  Location: [facility];  Service: Open Heart Surgery;  Laterality: N/A;    There were no  vitals filed for this visit.  Subjective Assessment - 12/21/18 1800    Subjective  Patient reports her pain is better today at about 7/10 in the R low back. She has not been as active today as usual and feels this helped her pain stay lower. She has been doing the prone press up exercise and continues to feel it may be helping, but also stated her pain has remained about the same as usual. She only did 2 sets prone press ups on Saturday because she was so sore but the past two days she has felt better while doing them. She felt pretty good following her last treatment session.    Pertinent History  PMH includes atrial  fibrillation, CAD, cancer ([place]), depression, fibromyalgia, HTN, HLD, CVA in 2019, history of MI with CABG in 2018. Patient has had low back pain and hip pain for years, has had therapy in the past with good results. Reported she is most limited in functional abilities, standing and walking.    Limitations  Walking;Sitting;Standing;House hold activities;Lifting    How long can you sit comfortably?  can sit how long she wants if she often bends forward    How long can you stand comfortably?  unable to tolerated standing at all in one position    How long can you walk comfortably?  15 minutes    Diagnostic tests  Diffuse multilevel degenerative change with scoliosis concaveleft. 2 mm retrolisthesis L1 on L2 and L2 on L3. This most likelydegenerative. No acute bony abnormality.Aortoiliac and visceral atherosclerotic vascular disease. Mildectasia of the abdominal aorta can not be excluded. Aorticultrasound can be obtained to further evaluate.    Patient Stated Goals  Patient would like to decrease her pain, improve her ability to move    Pain Score  7     Pain Location  Back    Pain Orientation  Right    Pain Type  Chronic pain    Pain Onset  More than a month ago    Pain Frequency  Constant        TREATMENT: Denies history of spinal surgery Denies sensitivity to latex Denies history of long term prednisone use  Therapeutic exercise:to centralize symptoms and improve ROM, strength, muscular endurance, and activity tolerance required for successful completion of functional activities. -NuStep level1using bilateral upper and lower extremities. Seat/handle setting 6. For improved extremity mobility, muscular endurance, and activity tolerance; and to induce the analgesic effect of aerobic exercise, stimulate improved joint nutrition, and prepare body structures and systems for following interventions. X 5 Min during subjective exam. (manual therapy - see below) -Prone hip extension with  knee flexed to preferentially bias gluteus maximus.2x10 each side.Cuing to keep knee flexed. Patient demonstrating greatly increased ability to complete this activity, but still struggles to lift off mat. Required cuing to even out breathing with moderate improvement.  - prone isometric hip ER against pink theraball between legs, 2x10 with 5 second holds, clinician facilitated movement and cued for improved breathing. Improved contraction today compared to previous sessions.  - prone hip AROM IR x 10 to help pateint feel end range IR. Able to move actively past neutral today with 1-2 facilitated reps. Cuing for improved breathing pattern.  - prone isometric hip IR against manual resistance x 10. Improved activation compared to previous visits. Cuing for improved breathing pattern.  - supine neutral curl up with one leg in hooklying, hand in small of back. Patient unable to lift upper body due to abdominal weakness  and poor motor control. Able to lift head with abdominal contraction. Cuing to improve breathing for improved relaxation and neuromuscular control. 2x10.  - attempted modified plank rotation at wall but patient unable to move feet for sufficient form, discontinued.  - side plank modified at wall with abdominal brace (elbow on wall, feet on ground)  2x5 breath cycles each side. To improve strength, neuromuscular control, and endurance of llateral abdominal musculature. Cuing for breathing. Patient able to achieve position with minimal cuing second set.  - Education on HEP including handout   Manual therapy: to reduce pain and tissue tension, improve range of motion, neuromodulation, in order to promote improved ability to complete functional activities. Patient in prone position with head in cradle, pillow supporting lower legs:  - deep STM with trigger point release as tolerated at right superior fibers of glute muscles to sciatic notch and glute med region, R lower lumbar paraspinals and  multifidi region to decrease pain and tension. Patient has significant tenderness that reproduces her pain in each region. Cuing to improve evenness of breathing for improved relaxation.   HOME EXERCISE PROGRAM Access Code: [email or web address]  URL: [email or web address]  Date: 12/21/2018  Prepared by: Rosita Kea   Exercises  Neutral Curl Up with Straight Leg - 2 sets - 10 reps - 1 breath hold - 2x daily  Side plank - Wall modification - 2 sets - 5 Breaths - 2x daily     PT Education - 12/21/18 1807    Education provided  Yes    Education Details  Exercise purpose/form. Self management techniques.    Person(s) Educated  Patient    Methods  Explanation;Demonstration;Tactile cues;Verbal cues    Comprehension  Verbalized understanding;Returned demonstration       PT Short Term Goals - 12/05/18 1938      PT SHORT TERM GOAL #1   Title  Patient will be compliant with initial HEP in preparation for self management of condition    Baseline  continues with long term HEP from previous episodes of care.    Time  4    Period  Weeks    Status  Achieved    Target Date  12/28/18        PT Long Term Goals - 11/30/18 1849      PT LONG TERM GOAL #1   Title  Pt will decrease mODI scoreby at least 13 points in order demonstrate clinically significant reduction in back pain/disability.    Baseline  11/30/2018 27/50;    Time  8    Period  Weeks    Status  New    Target Date  01/25/19      PT LONG TERM GOAL #2   Title  Pt will be independent with HEP in order to improve strength and decrease back pain in order to improve pain-free function at home and work.    Baseline  to be administered next session    Time  8    Period  Weeks    Status  New    Target Date  01/25/19      PT LONG TERM GOAL #3   Title  Pt will decrease worst back pain as reported on NPRS by at least 2 points in order to demonstrate clinically significant reduction in back pain.    Baseline  worst pain 10/10  11/30/2018    Time  8    Period  Weeks    Status  New    Target Date  01/25/19      PT LONG TERM GOAL #4   Title  Pt will increase strength of by at least 1/2 MMT grade in order to demonstrate improvement in strength and function.    Baseline  see objective of eval 11/30/2018    Time  8    Period  Weeks    Status  New    Target Date  01/25/19      PT LONG TERM GOAL #5   Title  Patient will report that she is able to walk at least 46minutes with low back pain/hip pain no greater than 5/10 to improve pts tolerance to endurance activities to increase pt's overall health as well as ability to perform functional activities.    Baseline  ~81minutes 11/30/2018    Time  8    Period  Weeks    Status  New    Target Date  01/25/19            Plan - 12/21/18 1911    Clinical Impression Statement  Patient demonstrated significant improvement in gluteal activation today and was able to complete 10 reps of glute dominant hip extensions at a time as well as showed improved ER and IR activation and neuromuscular control. 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 Prone press ups were discontinued due to apparent lack of significant improvement and difficulty that patient has in performing. Session focused on improved breathing patterns and abdominal strengthening. Patient demo very poor abdominal control, awareness, and strength and was unable to perform a neutral curl up due to weakness. Patient also continues to demonstrate a strained breathing pattern with gasping and breath holding throughout exercises that improved with cuing to breath in during exercise contraction phase and out during relaxation phase. Patient found manual therapy quite uncomfortable but reported an overall reduction in pain from 7/10 to 6/10 and that she "feels looser" by end of session. Patient continues to participate well in HEP and in session exercises as tolerated. Pt required multimodal cuing for proper technique and to facilitate improved  neuromuscular control, strength, range of motion, and functional ability resulting in improved performance and form. Patient would benefit from continued physical therapy to address remaining impairments and functional limitations to work towards stated goals and return to PLOF or maximal functional independence.    Personal Factors and Comorbidities  Age;Time since onset of injury/illness/exacerbation;Comorbidity 3+;Profession;Past/Current Experience    Comorbidities  atrial fibrillation, CVA, MI, HTN, HLD, cancer, hypothyrodism    Examination-Activity Limitations  Bathing;Hygiene/Grooming;Squat;Stairs;Lift;Bed Mobility;Bend;Locomotion Level;Stand;Reach Overhead;Caring for Others;Carry;Transfers;Sit;Dressing;Sleep    Examination-Participation Restrictions  Church;Laundry;Shop;Cleaning;Volunteer;Community Activity;Meal Prep;Yard Work;Driving    Stability/Clinical Decision Making  Stable/Uncomplicated    Rehab Potential  Fair    Clinical Impairments Affecting Rehab Potential  (+)motivated, previous improvement with physical therapy(-)chronic condition, pain, recent CABG x 3 02/20/2017, arthritis, HTN, CVA    PT Frequency  2x / week    PT Duration  8 weeks    PT Treatment/Interventions  Cryotherapy;Electrical Stimulation;Moist Heat;Therapeutic exercise;Therapeutic activities;Neuromuscular re-education;Patient/family education;Manual techniques;Balance training;Canalith Repostioning;Traction;Dry needling;Joint Manipulations;Spinal Manipulations;Splinting;Taping;Energy conservation;Gait training;Stair training;Ultrasound;Functional mobility training;Passive range of motion;Vestibular;Aquatic Therapy;ADLs/Self Care Home Management    PT Next Visit Plan  continue hip strengthening as tolerated.    PT Home Exercise Plan  Medbridge Access Code: [email or web address]; Pt reports she performs seated heel/toe raises, SLR, seated marching, heel slides, demonstrated good knowledge of log rolling technique    Consulted and Agree  with Plan of Care  Patient       Patient will benefit from skilled therapeutic  intervention in order to improve the following deficits and impairments:  Pain, Improper body mechanics, Increased muscle spasms, Decreased activity tolerance, Decreased endurance, Decreased range of motion, Decreased strength, Impaired perceived functional ability, Postural dysfunction, Decreased mobility, Difficulty walking, Impaired flexibility  Visit Diagnosis: 1. Muscle weakness (generalized)   2. Muscle spasm of back   3. Difficulty in walking, not elsewhere classified   4. Low back pain with sciatica, sciatica laterality unspecified, unspecified back pain laterality, unspecified chronicity   5. Pain in right hip        Problem List Patient Active Problem List   Diagnosis Date Noted  . Chest pain 10/26/2018  . TIA (transient ischemic attack) 03/21/2018  . CVA (cerebral vascular accident) ([place]) 10/22/2017  . Abnormal nuclear stress test 05/24/2017  . 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 PAF (paroxysmal atrial fibrillation) ([place])   . Non-STEMI (non-ST elevated myocardial infarction) ([place]) 02/20/2017  . 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 Moderate mitral insufficiency 01/11/2017  . Personal history of colonic polyps   . Benign neoplasm of ascending colon   . Benign neoplasm of descending colon   . Disequilibrium 09/16/2015  . Panic attacks 09/11/2015  . Ganglion cyst 08/16/2015  . Hypernatremia 07/04/2015  . Upper back pain 07/04/2015  . Other osteoarthritis involving multiple joints 02/06/2015  . Clinical depression 09/27/2014  . Dry mouth 09/27/2014  . Fibrositis 09/27/2014  . LBP (low back pain) 09/27/2014  . Avitaminosis D 09/27/2014  . Decreased leukocytes 09/27/2014  . Abnormal blood sugar 05/03/2009  . Insomnia 09/29/2007  . HTN (hypertension) 03/09/2007  . Adaptation reaction 01/29/2007  . Acid reflux 10/07/2006  . Menopausal and  postmenopausal disorder 10/05/2006  . Headache, migraine 10/05/2006  . Arthritis, degenerative 03/23/1994  . Adult hypothyroidism 04/15/1993  . Hyperlipidemia LDL goal <70 03/25/1993   Everlean Alstrom. Graylon Good, PT, DPT 12/21/18, 7:12 PM  [facility] [address], [place], [address] Phone: [phone]   Fax:  [phone]  Name: Stacey [name] MRN: [id number] Date of Birth: 1940-12-29

## 2018-12-22 ENCOUNTER — Ambulatory Visit: Payer: Medicare Other

## 2018-12-22 ENCOUNTER — Encounter: Payer: Medicare Other | Admitting: Family Medicine

## 2018-12-26 ENCOUNTER — Ambulatory Visit: Payer: Medicare Other | Admitting: Physical Therapy

## 2018-12-28 ENCOUNTER — Other Ambulatory Visit: Payer: Self-pay

## 2018-12-28 ENCOUNTER — Encounter: Payer: Self-pay | Admitting: Physical Therapy

## 2018-12-28 ENCOUNTER — Ambulatory Visit: Payer: Medicare Other | Attending: Family Medicine | Admitting: Physical Therapy

## 2018-12-28 DIAGNOSIS — M62830 Muscle spasm of back: Secondary | ICD-10-CM | POA: Insufficient documentation

## 2018-12-28 DIAGNOSIS — R262 Difficulty in walking, not elsewhere classified: Secondary | ICD-10-CM | POA: Diagnosis not present

## 2018-12-28 DIAGNOSIS — M6281 Muscle weakness (generalized): Secondary | ICD-10-CM | POA: Diagnosis not present

## 2018-12-28 DIAGNOSIS — M25551 Pain in right hip: Secondary | ICD-10-CM | POA: Diagnosis not present

## 2018-12-28 DIAGNOSIS — M5440 Lumbago with sciatica, unspecified side: Secondary | ICD-10-CM | POA: Insufficient documentation

## 2018-12-28 NOTE — Therapy (Signed)
[facility] [address], [place], [address] Phone: [phone]   Fax:  [phone]  Physical Therapy Treatment  Patient Details  Name: Stacey [name] MRN: [id number] Date of Birth: 01-06-1941 Referring Provider (PT): Miguel Aschoff   Encounter Date: 12/28/2018  PT End of Session - 12/28/18 1851    Visit Number  7    Number of Visits  18    Date for PT Re-Evaluation  01/25/19    Authorization Type  Medicare reporting period from 11/30/2018    Authorization Time Period  current cert period 0/01/3817 - 01/25/2019 (last PN: IE 11/30/2018);    Authorization - Visit Number  7    Authorization - Number of Visits  10    PT Start Time  2993    PT Stop Time  1830    PT Time Calculation (min)  45 min    Activity Tolerance  Patient tolerated treatment well;Patient limited by pain    Behavior During Therapy  [facility] for tasks assessed/performed       Past Medical History:  Diagnosis Date  . Abnormal nuclear stress test 05/24/2017  . Arthritis   . Asthma   . Atrial fibrillation, transient ([place])   . 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 Cancer ([place])   . Complication of anesthesia   . Depression   . Family history of adverse reaction to anesthesia    most of family - PONV  . Fibromyalgia   . Glaucoma   . Headache    migraines - 1-2x/mo  . HTN (hypertension) 03/09/2007  . Hyperlipidemia LDL goal <70 03/25/1993  . Hypertension   . Motion sickness    all moving vehicles  . PONV (postoperative nausea and vomiting)   . Stroke ([place]) 2019  . Thyroid disease     Past Surgical History:  Procedure Laterality Date  . ABDOMINAL HYSTERECTOMY    . APPENDECTOMY    . BLADDER SURGERY    . CATARACT EXTRACTION W/ INTRAOCULAR LENS  IMPLANT, BILATERAL    . COLONOSCOPY WITH PROPOFOL N/A 01/17/2016   Procedure: COLONOSCOPY WITH PROPOFOL;  Surgeon: [name], MD;  Location: [place];  Service: Endoscopy;  Laterality: N/A;  . CORONARY ARTERY BYPASS GRAFT N/A 02/20/2017   Procedure: CORONARY ARTERY BYPASS GRAFTING (CABG) x , three using left internal mammary artery to left anterior descending coronary artery and right greater saphenous vein harvested endoscopically to distal right and diagonal coronary arteries.;  Surgeon: Grace Isaac, MD;  Location: [place];  Service: Open Heart Surgery;  Laterality: N/A;  . LEFT HEART CATH AND CORONARY ANGIOGRAPHY N/A 02/20/2017   Procedure: LEFT HEART CATH AND CORONARY ANGIOGRAPHY;  Surgeon: Isaias Cowman, MD;  Location: [facility];  Service: Cardiovascular;  Laterality: N/A;  . LEFT HEART CATH AND CORS/GRAFTS ANGIOGRAPHY N/A 05/24/2017   Procedure: LEFT HEART CATH AND CORS/GRAFTS ANGIOGRAPHY;  Surgeon: Sherren Mocha, MD;  Location: [facility] [place];  Service: Cardiovascular;  Laterality: N/A;  . NASAL SINUS SURGERY    . POLYPECTOMY  01/17/2016   Procedure: POLYPECTOMY;  Surgeon: [name], MD;  Location: [place] [facility];  Service: Endoscopy;;  . RIGHT OOPHORECTOMY    . TEE WITHOUT CARDIOVERSION N/A 02/20/2017   Procedure: TRANSESOPHAGEAL ECHOCARDIOGRAM (TEE);  Surgeon: Grace Isaac, MD;  Location: [facility];  Service: Open Heart Surgery;  Laterality: N/A;    There were no  vitals filed for this visit.  Subjective Assessment - 12/28/18 1751    Subjective  Patient reports she is doing pretty well today and rates her pain a 7/10 in the R low back, glute, and groin area and it moves around. She reports she was a bit sore but nothing out of the ordinary following last treatment session. She reports her HEP is going well but sometimes she doesn't get all three times in but always does 2.    Pertinent History  PMH includes atrial fibrillation, CAD, cancer ([place]), depression, fibromyalgia, HTN, HLD, CVA in 2019, history of MI with CABG in 2018. Patient has had low back pain and hip pain for years, has had  therapy in the past with good results. Reported she is most limited in functional abilities, standing and walking.    Limitations  Walking;Sitting;Standing;House hold activities;Lifting    How long can you sit comfortably?  can sit how long she wants if she often bends forward    How long can you stand comfortably?  unable to tolerated standing at all in one position    How long can you walk comfortably?  15 minutes    Diagnostic tests  Diffuse multilevel degenerative change with scoliosis concaveleft. 2 mm retrolisthesis L1 on L2 and L2 on L3. This most likelydegenerative. No acute bony abnormality.Aortoiliac and visceral atherosclerotic vascular disease. Mildectasia of the abdominal aorta can not be excluded. Aorticultrasound can be obtained to further evaluate.    Patient Stated Goals  Patient would like to decrease her pain, improve her ability to move    Currently in Pain?  Yes    Pain Score  7     Pain Location  Back    Pain Orientation  Right    Pain Descriptors / Indicators  Aching;Stabbing;Spasm    Pain Type  Chronic pain    Pain Radiating Towards  right hip/glute    Pain Onset  More than a month ago    Pain Frequency  Constant       TREATMENT: Denies history of spinal surgery Denies sensitivity to latex Denies history of long term prednisone use  Therapeutic exercise:to centralize symptoms and improve ROM, strength, muscular endurance, and activity tolerance required for successful completion of functional activities. -NuStep level1using bilateral upper and lower extremities.Seat/handle setting 6. For improved extremity mobility, muscular endurance, and activity tolerance; and to induce the analgesic effect of aerobic exercise, stimulate improved joint nutrition, and prepare body structures and systems for following interventions. X 5 Min during subjective exam. (manual therapy - see below)   Circuit 1: -Prone hip extension with knee flexed to preferentially bias  gluteus maximus.2x10 each side.Cuing to keep knee flexed. Placed pillow under hips to improve range.Marland Kitchen Required cuing to even out breathing with moderate improvement.  - prone isometric hip ER against pink theraball between legs, 2x10 with 5 second holds, clinician facilitated movement and cued for improved breathing. Improved contraction today compared to previous sessions.   - prone isometric hip IR against manual resistance, 5x AROM as prep, then 2x 10. Improved activation compared to previous visits. Cuing for improved breathing pattern.   Further exercise: - Quadruped bird dog (alternating shoulder flexion/contralateral hip extension with core muscles braced). Started with single arms first x 1 each side, then legs only. x 5 each. Cuing for abdominal brace. plus time for instruction, rest, and transition. -Standing cervical thoracic extension/BUE flexion and serratus anterior activation, lat stretch, with foam roller up wall, 5 second holds, cuing for technique.  x 10 plus time for instruction, transitions, and appropriate rest.  - side plank modified at wall with abdominal brace (elbow on wall, feet on ground)  x10 seconds each side. To improve strength, neuromuscular control, and endurance of lateral abdominal musculature. Cuing for breathing. Patient able to achieve position with minimal cuing.  - supine neutral curl up with one leg in hooklying, hand in small of back. Patient unable to lift upper body due to abdominal weakness and poor motor control. Able to lift head and slightly lift shoulders. Cuing to improve breathing for improved relaxation and neuromuscular control. x10. Report back pain  Manual therapy: to reduce pain and tissue tension, improve range of motion, neuromodulation, in order to promote improved ability to complete functional activities. Patient in prone position with head in cradle, pillow supporting lower legs:  - deep STM with trigger point release as tolerated at right  superior fibers of glute muscles to sciatic notch and glute med region, R lower lumbar paraspinals and multifidi region to decrease pain and tension. Patient has significant tenderness that reproduces her pain in each region. Cuing to improve evenness of breathing for improved relaxation.  - prone R PA hip mobilization in prone with mob belt, grade III-IV with rectus femoris stretch, 2x10. Patient reported painful. Discontinued.   HOME EXERCISE PROGRAM Access Code: [email or web address]  URL: [email or web address]  Date: 12/21/2018  Prepared by: Rosita Kea   Exercises   Neutral Curl Up with Straight Leg - 2 sets - 10 reps - 1 breath hold - 2x daily   Side plank - Wall modification - 2 sets - 5 Breaths - 2x daily     PT Education - 12/28/18 1851    Education provided  Yes    Education Details  Exercise purpose/form. Self management techniques.    Person(s) Educated  Patient    Methods  Explanation;Demonstration;Tactile cues;Verbal cues    Comprehension  Returned demonstration;Verbalized understanding       PT Short Term Goals - 12/05/18 1938      PT SHORT TERM GOAL #1   Title  Patient will be compliant with initial HEP in preparation for self management of condition    Baseline  continues with long term HEP from previous episodes of care.    Time  4    Period  Weeks    Status  Achieved    Target Date  12/28/18        PT Long Term Goals - 11/30/18 1849      PT LONG TERM GOAL #1   Title  Pt will decrease mODI scoreby at least 13 points in order demonstrate clinically significant reduction in back pain/disability.    Baseline  11/30/2018 27/50;    Time  8    Period  Weeks    Status  New    Target Date  01/25/19      PT LONG TERM GOAL #2   Title  Pt will be independent with HEP in order to improve strength and decrease back pain in order to improve pain-free function at home and work.    Baseline  to be administered next session    Time  8    Period  Weeks    Status   New    Target Date  01/25/19      PT LONG TERM GOAL #3   Title  Pt will decrease worst back pain as reported on NPRS by at least 2 points in order to demonstrate clinically significant reduction  in back pain.    Baseline  worst pain 10/10 11/30/2018    Time  8    Period  Weeks    Status  New    Target Date  01/25/19      PT LONG TERM GOAL #4   Title  Pt will increase strength of by at least 1/2 MMT grade in order to demonstrate improvement in strength and function.    Baseline  see objective of eval 11/30/2018    Time  8    Period  Weeks    Status  New    Target Date  01/25/19      PT LONG TERM GOAL #5   Title  Patient will report that she is able to walk at least 38minutes with low back pain/hip pain no greater than 5/10 to improve pts tolerance to endurance activities to increase pt's overall health as well as ability to perform functional activities.    Baseline  ~46minutes 11/30/2018    Time  8    Period  Weeks    Status  New    Target Date  01/25/19            Plan - 12/28/18 1850    Clinical Impression Statement  Patient continues to have significant pain with all exercises that decreases upon rest and feels that this is normal for her. She also finds manual uncomfortable but feels it is helpful later in the day. Patient demonstrates good improvement of neuromuscular control for curl up but continues to struggle with pain and weakness in this area. Introduced Set designer exercise today and patient was able to complete with some difficulty. Pt required multimodal cuing for proper technique and to facilitate improved neuromuscular control, strength, range of motion, and functional ability resulting in improved performance and form. Patient would benefit from continued physical therapy to address remaining impairments and functional limitations to work towards stated goals and return to PLOF or maximal functional independence.    Personal Factors and Comorbidities  Age;Time since onset  of injury/illness/exacerbation;Comorbidity 3+;Profession;Past/Current Experience    Comorbidities  atrial fibrillation, CVA, MI, HTN, HLD, cancer, hypothyrodism    Examination-Activity Limitations  Bathing;Hygiene/Grooming;Squat;Stairs;Lift;Bed Mobility;Bend;Locomotion Level;Stand;Reach Overhead;Caring for Others;Carry;Transfers;Sit;Dressing;Sleep    Examination-Participation Restrictions  Church;Laundry;Shop;Cleaning;Volunteer;Community Activity;Meal Prep;Yard Work;Driving    Stability/Clinical Decision Making  Stable/Uncomplicated    Rehab Potential  Fair    Clinical Impairments Affecting Rehab Potential  (+)motivated, previous improvement with physical therapy(-)chronic condition, pain, recent CABG x 3 02/20/2017, arthritis, HTN, CVA    PT Frequency  2x / week    PT Duration  8 weeks    PT Treatment/Interventions  Cryotherapy;Electrical Stimulation;Moist Heat;Therapeutic exercise;Therapeutic activities;Neuromuscular re-education;Patient/family education;Manual techniques;Balance training;Canalith Repostioning;Traction;Dry needling;Joint Manipulations;Spinal Manipulations;Splinting;Taping;Energy conservation;Gait training;Stair training;Ultrasound;Functional mobility training;Passive range of motion;Vestibular;Aquatic Therapy;ADLs/Self Care Home Management    PT Next Visit Plan  continue hip strengthening as tolerated.    PT Home Exercise Plan  Medbridge Access Code: [email or web address]; Pt reports she performs seated heel/toe raises, SLR, seated marching, heel slides, demonstrated good knowledge of log rolling technique    Consulted and Agree with Plan of Care  Patient       Patient will benefit from skilled therapeutic intervention in order to improve the following deficits and impairments:  Pain, Improper body mechanics, Increased muscle spasms, Decreased activity tolerance, Decreased endurance, Decreased range of motion, Decreased strength, Impaired perceived functional ability, Postural dysfunction,  Decreased mobility, Difficulty walking, Impaired flexibility  Visit Diagnosis: 1. Muscle weakness (generalized)   2. Muscle spasm of  back   3. Difficulty in walking, not elsewhere classified   4. Low back pain with sciatica, sciatica laterality unspecified, unspecified back pain laterality, unspecified chronicity   5. Pain in right hip        Problem List Patient Active Problem List   Diagnosis Date Noted  . Chest pain 10/26/2018  . TIA (transient ischemic attack) 03/21/2018  . CVA (cerebral vascular accident) ([place]) 10/22/2017  . Abnormal nuclear stress test 05/24/2017  . 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 PAF (paroxysmal atrial fibrillation) ([place])   . Non-STEMI (non-ST elevated myocardial infarction) ([place]) 02/20/2017  . 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 Moderate mitral insufficiency 01/11/2017  . Personal history of colonic polyps   . Benign neoplasm of ascending colon   . Benign neoplasm of descending colon   . Disequilibrium 09/16/2015  . Panic attacks 09/11/2015  . Ganglion cyst 08/16/2015  . Hypernatremia 07/04/2015  . Upper back pain 07/04/2015  . Other osteoarthritis involving multiple joints 02/06/2015  . Clinical depression 09/27/2014  . Dry mouth 09/27/2014  . Fibrositis 09/27/2014  . LBP (low back pain) 09/27/2014  . Avitaminosis D 09/27/2014  . Decreased leukocytes 09/27/2014  . Abnormal blood sugar 05/03/2009  . Insomnia 09/29/2007  . HTN (hypertension) 03/09/2007  . Adaptation reaction 01/29/2007  . Acid reflux 10/07/2006  . Menopausal and postmenopausal disorder 10/05/2006  . Headache, migraine 10/05/2006  . Arthritis, degenerative 03/23/1994  . Adult hypothyroidism 04/15/1993  . Hyperlipidemia LDL goal <70 03/25/1993    Everlean Alstrom. Graylon Good, PT, DPT 12/28/18, 6:53 PM  [facility] [address], [place], [address] Phone: [phone]    Fax:  [phone]  Name: CENIYAH THORP MRN: [id number] Date of Birth: 1941/02/12

## 2019-01-02 ENCOUNTER — Other Ambulatory Visit: Payer: Self-pay

## 2019-01-02 ENCOUNTER — Ambulatory Visit: Payer: Medicare Other | Admitting: Physical Therapy

## 2019-01-02 ENCOUNTER — Encounter: Payer: Self-pay | Admitting: Physical Therapy

## 2019-01-02 DIAGNOSIS — R262 Difficulty in walking, not elsewhere classified: Secondary | ICD-10-CM | POA: Diagnosis not present

## 2019-01-02 DIAGNOSIS — M62830 Muscle spasm of back: Secondary | ICD-10-CM

## 2019-01-02 DIAGNOSIS — M25551 Pain in right hip: Secondary | ICD-10-CM | POA: Diagnosis not present

## 2019-01-02 DIAGNOSIS — M6281 Muscle weakness (generalized): Secondary | ICD-10-CM | POA: Diagnosis not present

## 2019-01-02 DIAGNOSIS — M5440 Lumbago with sciatica, unspecified side: Secondary | ICD-10-CM | POA: Diagnosis not present

## 2019-01-02 NOTE — Therapy (Signed)
[facility] [address] [place], [address] Phone: [phone]   Fax:  [phone]  Physical Therapy Treatment  Patient Details  Name: Stacey [name] MRN: [id number] Date of Birth: 29-Jun-1940 Referring Provider (PT): Miguel Aschoff   Encounter Date: 01/02/2019  PT End of Session - 01/03/19 1525    Visit Number  8    Number of Visits  18    Date for PT Re-Evaluation  01/25/19    Authorization Type  Medicare reporting period from 11/30/2018    Authorization Time Period  current cert period 05/31/7937 - 01/25/2019 (last PN: IE 11/30/2018);    Authorization - Visit Number  8    Authorization - Number of Visits  10    PT Start Time  1805    PT Stop Time  1850    PT Time Calculation (min)  45 min    Activity Tolerance  Patient tolerated treatment well;Patient limited by pain    Behavior During Therapy  [facility] for tasks assessed/performed       Past Medical History:  Diagnosis Date  . Abnormal nuclear stress test 05/24/2017  . Arthritis   . Asthma   . Atrial fibrillation, transient ([place])   . 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 Cancer ([place])   . Complication of anesthesia   . Depression   . Family history of adverse reaction to anesthesia    most of family - PONV  . Fibromyalgia   . Glaucoma   . Headache    migraines - 1-2x/mo  . HTN (hypertension) 03/09/2007  . Hyperlipidemia LDL goal <70 03/25/1993  . Hypertension   . Motion sickness    all moving vehicles  . PONV (postoperative nausea and vomiting)   . Stroke ([place]) 2019  . Thyroid disease     Past Surgical History:  Procedure Laterality Date  . ABDOMINAL HYSTERECTOMY    . APPENDECTOMY    . BLADDER SURGERY    . CATARACT EXTRACTION W/ INTRAOCULAR LENS  IMPLANT, BILATERAL    . COLONOSCOPY WITH PROPOFOL N/A 01/17/2016   Procedure: COLONOSCOPY WITH PROPOFOL;  Surgeon: [name], MD;  Location: [place];  Service: Endoscopy;  Laterality: N/A;  . CORONARY ARTERY BYPASS GRAFT N/A 02/20/2017   Procedure: CORONARY ARTERY BYPASS GRAFTING (CABG) x , three using left internal mammary artery to left anterior descending coronary artery and right greater saphenous vein harvested endoscopically to distal right and diagonal coronary arteries.;  Surgeon: Grace Isaac, MD;  Location: [facility];  Service: Open Heart Surgery;  Laterality: N/A;  . LEFT HEART CATH AND CORONARY ANGIOGRAPHY N/A 02/20/2017   Procedure: LEFT HEART CATH AND CORONARY ANGIOGRAPHY;  Surgeon: Isaias Cowman, MD;  Location: [place] [facility];  Service: Cardiovascular;  Laterality: N/A;  . LEFT HEART CATH AND CORS/GRAFTS ANGIOGRAPHY N/A 05/24/2017   Procedure: LEFT HEART CATH AND CORS/GRAFTS ANGIOGRAPHY;  Surgeon: Sherren Mocha, MD;  Location: [facility];  Service: Cardiovascular;  Laterality: N/A;  . NASAL SINUS SURGERY    . POLYPECTOMY  01/17/2016   Procedure: POLYPECTOMY;  Surgeon: [name], MD;  Location: [facility];  Service: Endoscopy;;  . RIGHT OOPHORECTOMY    . TEE WITHOUT CARDIOVERSION N/A 02/20/2017   Procedure: TRANSESOPHAGEAL ECHOCARDIOGRAM (TEE);  Surgeon: Grace Isaac, MD;  Location: [facility];  Service: Open Heart Surgery;  Laterality: N/A;    There were no  vitals filed for this visit.  Subjective Assessment - 01/02/19 1811    Subjective  Patient reports her pain is high today after a lot of activity today. Rates it 7/10 in the R hip/glute area. Reports she felt pretty good following last treatment session. Reports HEP is going well except Friday and Saturday when she had a period of nausea and vomiting (normal for her to get episodes occasionally).    Pertinent History  PMH includes atrial fibrillation, CAD, cancer (skin), depression, fibromyalgia, HTN, HLD, CVA in 2019, history of MI with CABG in 2018. Patient has had low back pain and hip pain for years, has had therapy in the  past with good results. Reported she is most limited in functional abilities, standing and walking.    Limitations  Walking;Sitting;Standing;House hold activities;Lifting    How long can you sit comfortably?  can sit how long she wants if she often bends forward    How long can you stand comfortably?  unable to tolerated standing at all in one position    How long can you walk comfortably?  15 minutes    Diagnostic tests  Diffuse multilevel degenerative change with scoliosis concaveleft. 2 mm retrolisthesis L1 on L2 and L2 on L3. This most likelydegenerative. No acute bony abnormality.Aortoiliac and visceral atherosclerotic vascular disease. Mildectasia of the abdominal aorta can not be excluded. Aorticultrasound can be obtained to further evaluate.    Patient Stated Goals  Patient would like to decrease her pain, improve her ability to move    Currently in Pain?  Yes    Pain Score  7     Pain Location  Back   glute/hip   Pain Orientation  Right    Pain Onset  More than a month ago       TREATMENT: Denies history of spinal surgery Denies sensitivity to latex Denies history of long term prednisone use  Therapeutic exercise:to centralize symptoms and improve ROM, strength, muscular endurance, and activity tolerance required for successful completion of functional activities. -NuStep level1using bilateral upper and lower extremities.Seat/handle setting 6. For improved extremity mobility, muscular endurance, and activity tolerance; and to induce the analgesic effect of aerobic exercise, stimulate improved joint nutrition, and prepare body structures and systems for following interventions. X 5 Min during subjective exam. (manual therapy - see below)  - seated isometric hip adduction against ball, seated on dynadisc. X 5 seconds. X 5 reps. Pt reports completes at home already.  - seated isometric hp abduction against strap, on dynadisc for core stability, x 5 second holds x 20.  -  standing dynamic lumbar rotation with foam roll held horizontally behind back. X20  - dynamic mobility avoiding stick moved randomly around pt's body by clincian to allow patient to work on moving more naturally through the spine, legs, and feet. Includes stepping over stick. To help relax patient and reduce muscle guarding and hyper-awareness of pain. To improve breathing.  - dynamic mobility reaching and rotating trunk in random order touching end of stick placed at various levels and sides to improve lumbar and hip motion in functional manner without hyper-focus on pain or movement patterns.  - step up to 4 inch step collecting cones placed at various levels and locations to improve hip strength and balance while completing dynamic task. 2x10 R side, x 10 left side. Patient fatigued.    Manual therapy:to reduce pain and tissue tension, improve range of motion, neuromodulation, in order to promote improved ability to complete functional activities. Patient  in prone position with head in cradle, pillow supporting lower legs:  - deep STM with trigger point release as tolerated at right superior fibers of glute muscles to sciatic notch and glute med region, R lower lumbar paraspinals and multifidi region to decrease pain and tension. Patient has significant tenderness that reproduces her pain in each region. Cuing to improve evenness of breathing for improved relaxation.   HOME EXERCISE PROGRAM Access Code: [email or web address]  URL: [email or web address]  Date: 12/21/2018  Prepared by: Rosita Kea   Exercises   Neutral Curl Up with Straight Leg - 2 sets - 10 reps - 1 breath hold - 2x daily   Side plank - Wall modification - 2 sets - 5 Breaths - 2x daily   PT Education - 01/03/19 1525    Education provided  Yes    Education Details  Exercise purpose/form. Self management techniques.    Person(s) Educated  Patient    Methods  Explanation;Demonstration;Tactile cues;Verbal cues     Comprehension  Verbalized understanding;Returned demonstration       PT Short Term Goals - 12/05/18 1938      PT SHORT TERM GOAL #1   Title  Patient will be compliant with initial HEP in preparation for self management of condition    Baseline  continues with long term HEP from previous episodes of care.    Time  4    Period  Weeks    Status  Achieved    Target Date  12/28/18        PT Long Term Goals - 11/30/18 1849      PT LONG TERM GOAL #1   Title  Pt will decrease mODI scoreby at least 13 points in order demonstrate clinically significant reduction in back pain/disability.    Baseline  11/30/2018 27/50;    Time  8    Period  Weeks    Status  New    Target Date  01/25/19      PT LONG TERM GOAL #2   Title  Pt will be independent with HEP in order to improve strength and decrease back pain in order to improve pain-free function at home and work.    Baseline  to be administered next session    Time  8    Period  Weeks    Status  New    Target Date  01/25/19      PT LONG TERM GOAL #3   Title  Pt will decrease worst back pain as reported on NPRS by at least 2 points in order to demonstrate clinically significant reduction in back pain.    Baseline  worst pain 10/10 11/30/2018    Time  8    Period  Weeks    Status  New    Target Date  01/25/19      PT LONG TERM GOAL #4   Title  Pt will increase strength of by at least 1/2 MMT grade in order to demonstrate improvement in strength and function.    Baseline  see objective of eval 11/30/2018    Time  8    Period  Weeks    Status  New    Target Date  01/25/19      PT LONG TERM GOAL #5   Title  Patient will report that she is able to walk at least 4minutes with low back pain/hip pain no greater than 5/10 to improve pts tolerance to endurance activities to increase pt's overall health as well as  ability to perform functional activities.    Baseline  ~19minutes 11/30/2018    Time  8    Period  Weeks    Status  New    Target  Date  01/25/19            Plan - 01/03/19 1905    Clinical Impression Statement  Patient tolerated treatment well. Had difficulty at first relaxing to complete dynamic movement exercises but improved motion and relaxation with repetition. Had some pain initially with step up on R that caused her to have difficulty stepping up but was able to move through it and improve form and smooth out activity with repetition. Patient improved in natural movement with dynamic exercises simulating functional activity. Appropriately fatigued by end of session and stated she felt looser and a bit better. Demonstrated some balance limitations but was able to recover from all imbalance with ankle or stepping strategy. Pt required multimodal cuing for proper technique and to facilitate improved neuromuscular control, strength, range of motion, and functional ability resulting in improved performance and form. Patient would benefit from continued physical therapy to address remaining impairments and functional limitations to work towards stated goals and return to PLOF or maximal functional independence.    Personal Factors and Comorbidities  Age;Time since onset of injury/illness/exacerbation;Comorbidity 3+;Profession;Past/Current Experience    Comorbidities  atrial fibrillation, CVA, MI, HTN, HLD, cancer, hypothyrodism    Examination-Activity Limitations  Bathing;Hygiene/Grooming;Squat;Stairs;Lift;Bed Mobility;Bend;Locomotion Level;Stand;Reach Overhead;Caring for Others;Carry;Transfers;Sit;Dressing;Sleep    Examination-Participation Restrictions  Church;Laundry;Shop;Cleaning;Volunteer;Community Activity;Meal Prep;Yard Work;Driving    Stability/Clinical Decision Making  Stable/Uncomplicated    Rehab Potential  Fair    Clinical Impairments Affecting Rehab Potential  (+)motivated, previous improvement with physical therapy(-)chronic condition, pain, recent CABG x 3 02/20/2017, arthritis, HTN, CVA    PT Frequency  2x /  week    PT Duration  8 weeks    PT Treatment/Interventions  Cryotherapy;Electrical Stimulation;Moist Heat;Therapeutic exercise;Therapeutic activities;Neuromuscular re-education;Patient/family education;Manual techniques;Balance training;Canalith Repostioning;Traction;Dry needling;Joint Manipulations;Spinal Manipulations;Splinting;Taping;Energy conservation;Gait training;Stair training;Ultrasound;Functional mobility training;Passive range of motion;Vestibular;Aquatic Therapy;ADLs/Self Care Home Management    PT Next Visit Plan  continue hip strengthening as tolerated.    PT Home Exercise Plan  Medbridge Access Code: [email or web address]; Pt reports she performs seated heel/toe raises, SLR, seated marching, heel slides, demonstrated good knowledge of log rolling technique    Consulted and Agree with Plan of Care  Patient       Patient will benefit from skilled therapeutic intervention in order to improve the following deficits and impairments:  Pain, Improper body mechanics, Increased muscle spasms, Decreased activity tolerance, Decreased endurance, Decreased range of motion, Decreased strength, Impaired perceived functional ability, Postural dysfunction, Decreased mobility, Difficulty walking, Impaired flexibility  Visit Diagnosis: 1. Muscle weakness (generalized)   2. Muscle spasm of back   3. Difficulty in walking, not elsewhere classified   4. Low back pain with sciatica, sciatica laterality unspecified, unspecified back pain laterality, unspecified chronicity   5. Pain in right hip        Problem List Patient Active Problem List   Diagnosis Date Noted  . Chest pain 10/26/2018  . TIA (transient ischemic attack) 03/21/2018  . CVA (cerebral vascular accident) ([place]) 10/22/2017  . Abnormal nuclear stress test 05/24/2017  . 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 PAF (paroxysmal atrial fibrillation) ([place])   . Non-STEMI (non-ST elevated myocardial  infarction) ([place]) 02/20/2017  . 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 Moderate mitral insufficiency 01/11/2017  . Personal history of colonic  polyps   . Benign neoplasm of ascending colon   . Benign neoplasm of descending colon   . Disequilibrium 09/16/2015  . Panic attacks 09/11/2015  . Ganglion cyst 08/16/2015  . Hypernatremia 07/04/2015  . Upper back pain 07/04/2015  . Other osteoarthritis involving multiple joints 02/06/2015  . Clinical depression 09/27/2014  . Dry mouth 09/27/2014  . Fibrositis 09/27/2014  . LBP (low back pain) 09/27/2014  . Avitaminosis D 09/27/2014  . Decreased leukocytes 09/27/2014  . Abnormal blood sugar 05/03/2009  . Insomnia 09/29/2007  . HTN (hypertension) 03/09/2007  . Adaptation reaction 01/29/2007  . Acid reflux 10/07/2006  . Menopausal and postmenopausal disorder 10/05/2006  . Headache, migraine 10/05/2006  . Arthritis, degenerative 03/23/1994  . Adult hypothyroidism 04/15/1993  . Hyperlipidemia LDL goal <70 03/25/1993    Everlean Alstrom. Graylon Good, PT, DPT 01/03/19, 7:07 PM  [facility] [address], [place], [address] Phone: [phone]   Fax:  [phone]  Name: Stacey [name] MRN: [id number] Date of Birth: 01-30-1941

## 2019-01-04 ENCOUNTER — Ambulatory Visit: Payer: Medicare Other | Admitting: Physical Therapy

## 2019-01-04 ENCOUNTER — Encounter: Payer: Self-pay | Admitting: Physical Therapy

## 2019-01-04 ENCOUNTER — Other Ambulatory Visit: Payer: Self-pay

## 2019-01-04 DIAGNOSIS — M62830 Muscle spasm of back: Secondary | ICD-10-CM | POA: Diagnosis not present

## 2019-01-04 DIAGNOSIS — M5440 Lumbago with sciatica, unspecified side: Secondary | ICD-10-CM | POA: Diagnosis not present

## 2019-01-04 DIAGNOSIS — M6281 Muscle weakness (generalized): Secondary | ICD-10-CM | POA: Diagnosis not present

## 2019-01-04 DIAGNOSIS — R262 Difficulty in walking, not elsewhere classified: Secondary | ICD-10-CM

## 2019-01-04 DIAGNOSIS — M25551 Pain in right hip: Secondary | ICD-10-CM | POA: Diagnosis not present

## 2019-01-04 NOTE — Therapy (Signed)
[facility] [address], [place], [address] Phone: [phone]   Fax:  [phone]  Physical Therapy Treatment  Patient Details  Name: Stacey [name] MRN: [id number] Date of Birth: [birth year] Referring Provider (PT): Miguel Aschoff   Encounter Date: 01/04/2019  PT End of Session - 01/04/19 1820    Visit Number  9    Number of Visits  18    Date for PT Re-Evaluation  01/25/19    Authorization Type  Medicare reporting period from 11/30/2018    Authorization Time Period  current cert period 01/29/7892 - 01/25/2019 (last PN: IE 11/30/2018);    Authorization - Visit Number  9    Authorization - Number of Visits  10    PT Start Time  8101    PT Stop Time  1855    PT Time Calculation (min)  40 min    Activity Tolerance  Patient tolerated treatment well;Patient limited by pain    Behavior During Therapy  [facility] for tasks assessed/performed       Past Medical History:  Diagnosis Date  . Abnormal nuclear stress test 05/24/2017  . Arthritis   . Asthma   . Atrial fibrillation, transient ([place])   . 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 Cancer ([place])   . Complication of anesthesia   . Depression   . Family history of adverse reaction to anesthesia    most of family - PONV  . Fibromyalgia   . Glaucoma   . Headache    migraines - 1-2x/mo  . HTN (hypertension) 03/09/2007  . Hyperlipidemia LDL goal <70 03/25/1993  . Hypertension   . Motion sickness    all moving vehicles  . PONV (postoperative nausea and vomiting)   . Stroke ([place]) 2019  . Thyroid disease     Past Surgical History:  Procedure Laterality Date  . ABDOMINAL HYSTERECTOMY    . APPENDECTOMY    . BLADDER SURGERY    . CATARACT EXTRACTION W/ INTRAOCULAR LENS  IMPLANT, BILATERAL    . COLONOSCOPY WITH PROPOFOL N/A 01/17/2016   Procedure: COLONOSCOPY WITH PROPOFOL;  Surgeon: [name], MD;  Location: [facility];  Service: Endoscopy;  Laterality: N/A;  . CORONARY ARTERY BYPASS GRAFT N/A 02/20/2017   Procedure: CORONARY ARTERY BYPASS GRAFTING (CABG) x , three using left internal mammary artery to left anterior descending coronary artery and right greater saphenous vein harvested endoscopically to distal right and diagonal coronary arteries.;  Surgeon: Grace Isaac, MD;  Location: [facility];  Service: Open Heart Surgery;  Laterality: N/A;  . LEFT HEART CATH AND CORONARY ANGIOGRAPHY N/A 02/20/2017   Procedure: LEFT HEART CATH AND CORONARY ANGIOGRAPHY;  Surgeon: Isaias Cowman, MD;  Location: [place] [facility];  Service: Cardiovascular;  Laterality: N/A;  . LEFT HEART CATH AND CORS/GRAFTS ANGIOGRAPHY N/A 05/24/2017   Procedure: LEFT HEART CATH AND CORS/GRAFTS ANGIOGRAPHY;  Surgeon: Sherren Mocha, MD;  Location: [facility];  Service: Cardiovascular;  Laterality: N/A;  . NASAL SINUS SURGERY    . POLYPECTOMY  01/17/2016   Procedure: POLYPECTOMY;  Surgeon: [name], MD;  Location: [facility];  Service: Endoscopy;;  . RIGHT OOPHORECTOMY    . TEE WITHOUT CARDIOVERSION N/A 02/20/2017   Procedure: TRANSESOPHAGEAL ECHOCARDIOGRAM (TEE);  Surgeon: Grace Isaac, MD;  Location: [facility];  Service: Open Heart Surgery;  Laterality: N/A;    There were no  vitals filed for this visit.  Subjective Assessment - 01/04/19 1819    Subjective  Patient report she is feelineg better today 6/10 in the back and R glute region. She reports she felt okay following last treatment session and actually went out and shot a few basketballs yesterday and today. HEP is going well at home.    Pertinent History  PMH includes atrial fibrillation, CAD, cancer (skin), depression, fibromyalgia, HTN, HLD, CVA in 2019, history of MI with CABG in 2018. Patient has had low back pain and hip pain for years, has had therapy in the past with good results. Reported she is most limited in functional abilities,  standing and walking.    Limitations  Walking;Sitting;Standing;House hold activities;Lifting    How long can you sit comfortably?  can sit how long she wants if she often bends forward    How long can you stand comfortably?  unable to tolerated standing at all in one position    How long can you walk comfortably?  15 minutes    Diagnostic tests  Diffuse multilevel degenerative change with scoliosis concaveleft. 2 mm retrolisthesis L1 on L2 and L2 on L3. This most likelydegenerative. No acute bony abnormality.Aortoiliac and visceral atherosclerotic vascular disease. Mildectasia of the abdominal aorta can not be excluded. Aorticultrasound can be obtained to further evaluate.    Patient Stated Goals  Patient would like to decrease her pain, improve her ability to move    Currently in Pain?  Yes    Pain Score  6     Pain Onset  More than a month ago       TREATMENT: Denies history of spinal surgery Denies sensitivity to latex Denies history of long term prednisone use  Therapeutic exercise:to centralize symptoms and improve ROM, strength, muscular endurance, and activity tolerance required for successful completion of functional activities. -NuStep level1using bilateral upper and lower extremities.Seat/handle setting 6. For improved extremity mobility, muscular endurance, and activity tolerance; and to induce the analgesic effect of aerobic exercise, stimulate improved joint nutrition, and prepare body structures and systems for following interventions. X 5 Min during subjective exam. av62 SPM average 0.18  (manual therapy - see below) - seated isometric hip abduction against strap, on dynadisc for core stability, x 5 second holds x 20.  - seated chops with 4# dumbbell held in both hands x10 each side while seated on dynadisc for improved trunk strength.  - dynamic mobility avoiding stick moved randomly around pt's body by clincian to allow patient to work on moving more naturally  through the spine, legs, and feet. Includes stepping over stick. To help relax patient and reduce muscle guarding and hyper-awareness of pain. To improve breathing.  - golf putting left handed x 8 to encourage lumbar rotation and flexion in functional and enjoyable activity.  For improved activity tolerance and decreased guarding.  - golf swing with moderate intensity and fuller motion to hit ball through door to next room. x10 each side to improve and strengthen lumbar motion in enjoyable activity. For improved activity tolerance and decreased guarding.  - baseball swing x 5 each directions using PVC stick and small ball to produce more upright lumbar rotation in functional and enjoyable context for improved activity tolerance and decreased guarding.    Manual therapy:to reduce pain and tissue tension, improve range of motion, neuromodulation, in order to promote improved ability to complete functional activities. Patient in prone position with head in blue cradle, pillow supporting lower legs and under waist:  -  deep STM with trigger point release as tolerated at right superior fibers of glute muscles to sciatic notch and glute med region, R lower lumbar paraspinals and multifidi region to decrease pain and tension. Patient has significant tenderness that reproduces her pain in each region.    HOME EXERCISE PROGRAM Access Code: [email or web address]  URL: [email or web address]  Date: 12/21/2018  Prepared by: Rosita Kea   Exercises   Neutral Curl Up with Straight Leg - 2 sets - 10 reps - 1 breath hold - 2x daily   Side plank - Wall modification - 2 sets - 5 Breaths - 2x daily     PT Education - 01/04/19 1820    Education provided  Yes    Education Details  Exercise purpose/form. Self management techniques.    Person(s) Educated  Patient    Methods  Explanation;Demonstration;Tactile cues    Comprehension  Verbalized understanding;Returned demonstration;Verbal cues required        PT Short Term Goals - 12/05/18 1938      PT SHORT TERM GOAL #1   Title  Patient will be compliant with initial HEP in preparation for self management of condition    Baseline  continues with long term HEP from previous episodes of care.    Time  4    Period  Weeks    Status  Achieved    Target Date  12/28/18        PT Long Term Goals - 11/30/18 1849      PT LONG TERM GOAL #1   Title  Pt will decrease mODI scoreby at least 13 points in order demonstrate clinically significant reduction in back pain/disability.    Baseline  11/30/2018 27/50;    Time  8    Period  Weeks    Status  New    Target Date  01/25/19      PT LONG TERM GOAL #2   Title  Pt will be independent with HEP in order to improve strength and decrease back pain in order to improve pain-free function at home and work.    Baseline  to be administered next session    Time  8    Period  Weeks    Status  New    Target Date  01/25/19      PT LONG TERM GOAL #3   Title  Pt will decrease worst back pain as reported on NPRS by at least 2 points in order to demonstrate clinically significant reduction in back pain.    Baseline  worst pain 10/10 11/30/2018    Time  8    Period  Weeks    Status  New    Target Date  01/25/19      PT LONG TERM GOAL #4   Title  Pt will increase strength of by at least 1/2 MMT grade in order to demonstrate improvement in strength and function.    Baseline  see objective of eval 11/30/2018    Time  8    Period  Weeks    Status  New    Target Date  01/25/19      PT LONG TERM GOAL #5   Title  Patient will report that she is able to walk at least 80minutes with low back pain/hip pain no greater than 5/10 to improve pts tolerance to endurance activities to increase pt's overall health as well as ability to perform functional activities.    Baseline  ~45minutes 11/30/2018    Time  8  Period  Weeks    Status  New    Target Date  01/25/19            Plan - 01/05/19 1957     Clinical Impression Statement  Patient tolerated treatment well and demonstrated excellent engagement in activities with more natural movement and breathing patterns during golf and baseball swings. Improved natural motion with stick avoidance activity as well. Patient reported at end of session that she could feel the activity had increased her pain some and felt that she "would pay for it later." Patient's response over the next few days will be monitored so activities can be appropriately modified at future visits. Patient continues to demonstrate very tense performance of activities when focused on them with artificially strained breathing due to effort and concentration. She appeared to benefit from more functional activities that distracted her from concentrating so much on controlling her motions. She continues to be limited by chronic pain, low activity tolerance, decreased endurance, and strength and ROM deficits. Patient would benefit from continued physical therapy to address remaining impairments and functional limitations to work towards stated goals and return to PLOF or maximal functional independence.    Personal Factors and Comorbidities  Age;Time since onset of injury/illness/exacerbation;Comorbidity 3+;Profession;Past/Current Experience    Comorbidities  atrial fibrillation, CVA, MI, HTN, HLD, cancer, hypothyrodism    Examination-Activity Limitations  Bathing;Hygiene/Grooming;Squat;Stairs;Lift;Bed Mobility;Bend;Locomotion Level;Stand;Reach Overhead;Caring for Others;Carry;Transfers;Sit;Dressing;Sleep    Examination-Participation Restrictions  Church;Laundry;Shop;Cleaning;Volunteer;Community Activity;Meal Prep;Yard Work;Driving    Stability/Clinical Decision Making  Stable/Uncomplicated    Rehab Potential  Fair    Clinical Impairments Affecting Rehab Potential  (+)motivated, previous improvement with physical therapy(-)chronic condition, pain, recent CABG x 3 02/20/2017, arthritis, HTN, CVA     PT Frequency  2x / week    PT Duration  8 weeks    PT Treatment/Interventions  Cryotherapy;Electrical Stimulation;Moist Heat;Therapeutic exercise;Therapeutic activities;Neuromuscular re-education;Patient/family education;Manual techniques;Balance training;Canalith Repostioning;Traction;Dry needling;Joint Manipulations;Spinal Manipulations;Splinting;Taping;Energy conservation;Gait training;Stair training;Ultrasound;Functional mobility training;Passive range of motion;Vestibular;Aquatic Therapy;ADLs/Self Care Home Management    PT Next Visit Plan  continue hip strengthening as tolerated.    PT Home Exercise Plan  Medbridge Access Code: [email or web address]; Pt reports she performs seated heel/toe raises, SLR, seated marching, heel slides, demonstrated good knowledge of log rolling technique    Consulted and Agree with Plan of Care  Patient       Patient will benefit from skilled therapeutic intervention in order to improve the following deficits and impairments:  Pain, Improper body mechanics, Increased muscle spasms, Decreased activity tolerance, Decreased endurance, Decreased range of motion, Decreased strength, Impaired perceived functional ability, Postural dysfunction, Decreased mobility, Difficulty walking, Impaired flexibility  Visit Diagnosis: 1. Muscle weakness (generalized)   2. Muscle spasm of back   3. Difficulty in walking, not elsewhere classified   4. Low back pain with sciatica, sciatica laterality unspecified, unspecified back pain laterality, unspecified chronicity   5. Pain in right hip        Problem List Patient Active Problem List   Diagnosis Date Noted  . Chest pain 10/26/2018  . TIA (transient ischemic attack) 03/21/2018  . CVA (cerebral vascular accident) ([place]) 10/22/2017  . Abnormal nuclear stress test 05/24/2017  . 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 PAF (paroxysmal atrial fibrillation) ([place])   . Non-STEMI (non-ST  elevated myocardial infarction) ([place]) 02/20/2017  . 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 Moderate mitral insufficiency 01/11/2017  . Personal history of colonic polyps   . Benign neoplasm of ascending colon   .  Benign neoplasm of descending colon   . Disequilibrium 09/16/2015  . Panic attacks 09/11/2015  . Ganglion cyst 08/16/2015  . Hypernatremia 07/04/2015  . Upper back pain 07/04/2015  . Other osteoarthritis involving multiple joints 02/06/2015  . Clinical depression 09/27/2014  . Dry mouth 09/27/2014  . Fibrositis 09/27/2014  . LBP (low back pain) 09/27/2014  . Avitaminosis D 09/27/2014  . Decreased leukocytes 09/27/2014  . Abnormal blood sugar 05/03/2009  . Insomnia 09/29/2007  . HTN (hypertension) 03/09/2007  . Adaptation reaction 01/29/2007  . Acid reflux 10/07/2006  . Menopausal and postmenopausal disorder 10/05/2006  . Headache, migraine 10/05/2006  . Arthritis, degenerative 03/23/1994  . Adult hypothyroidism 04/15/1993  . Hyperlipidemia LDL goal <70 03/25/1993    Everlean Alstrom. Graylon Good, PT, DPT 01/05/19, 7:57 PM  [facility] [address] [place], [address] Phone: [phone]   Fax:  [phone]  Name: JEANENE MENA MRN: [id number] Date of Birth: 1941/01/15

## 2019-01-09 ENCOUNTER — Encounter: Payer: Self-pay | Admitting: Physical Therapy

## 2019-01-09 ENCOUNTER — Ambulatory Visit: Payer: Medicare Other | Admitting: Physical Therapy

## 2019-01-09 ENCOUNTER — Other Ambulatory Visit: Payer: Self-pay

## 2019-01-09 DIAGNOSIS — M6281 Muscle weakness (generalized): Secondary | ICD-10-CM | POA: Diagnosis not present

## 2019-01-09 DIAGNOSIS — M25551 Pain in right hip: Secondary | ICD-10-CM

## 2019-01-09 DIAGNOSIS — R262 Difficulty in walking, not elsewhere classified: Secondary | ICD-10-CM

## 2019-01-09 DIAGNOSIS — M5440 Lumbago with sciatica, unspecified side: Secondary | ICD-10-CM | POA: Diagnosis not present

## 2019-01-09 DIAGNOSIS — M62830 Muscle spasm of back: Secondary | ICD-10-CM

## 2019-01-09 NOTE — Therapy (Signed)
[facility] [address] [place], [address] Phone: [phone]   Fax:  [phone]  Physical Therapy Treatment / Progress Note / Re-Certification  Reporting period: 11/30/2018 - 01/09/2019  Patient Details  Name: Stacey [name] MRN: [id number] Date of Birth: 1940-08-03 Referring Provider (PT): Miguel Aschoff   Encounter Date: 01/09/2019  PT End of Session - 01/09/19 2025    Visit Number  10    Number of Visits  26    Date for PT Re-Evaluation  03/06/19    Authorization Type  Medicare reporting period from 11/30/2018 (new reporting period from 01/09/2019);    Authorization Time Period  current cert period 7/51/0258 - 03/06/2019 (last PN: 01/09/2019);    Authorization - Visit Number  10    Authorization - Number of Visits  10    PT Start Time  5277    PT Stop Time  1845    PT Time Calculation (min)  35 min    Activity Tolerance  Patient tolerated treatment well;Patient limited by pain    Behavior During Therapy  WFL for tasks assessed/performed       Past Medical History:  Diagnosis Date  . Abnormal nuclear stress test 05/24/2017  . Arthritis   . Asthma   . Atrial fibrillation, transient ([place])   . 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 Cancer ([place])   . Complication of anesthesia   . Depression   . Family history of adverse reaction to anesthesia    most of family - PONV  . Fibromyalgia   . Glaucoma   . Headache    migraines - 1-2x/mo  . HTN (hypertension) 03/09/2007  . Hyperlipidemia LDL goal <70 03/25/1993  . Hypertension   . Motion sickness    all moving vehicles  . PONV (postoperative nausea and vomiting)   . Stroke ([place]) 2019  . Thyroid disease     Past Surgical History:  Procedure Laterality Date  . ABDOMINAL HYSTERECTOMY    . APPENDECTOMY    . BLADDER SURGERY    . CATARACT EXTRACTION W/ INTRAOCULAR LENS  IMPLANT, BILATERAL    .  COLONOSCOPY WITH PROPOFOL N/A 01/17/2016   Procedure: COLONOSCOPY WITH PROPOFOL;  Surgeon: [name], MD;  Location: [facility];  Service: Endoscopy;  Laterality: N/A;  . CORONARY ARTERY BYPASS GRAFT N/A 02/20/2017   Procedure: CORONARY ARTERY BYPASS GRAFTING (CABG) x , three using left internal mammary artery to left anterior descending coronary artery and right greater saphenous vein harvested endoscopically to distal right and diagonal coronary arteries.;  Surgeon: Grace Isaac, MD;  Location: [facility];  Service: Open Heart Surgery;  Laterality: N/A;  . LEFT HEART CATH AND CORONARY ANGIOGRAPHY N/A 02/20/2017   Procedure: LEFT HEART CATH AND CORONARY ANGIOGRAPHY;  Surgeon: Isaias Cowman, MD;  Location: [facility];  Service: Cardiovascular;  Laterality: N/A;  . LEFT HEART CATH AND CORS/GRAFTS ANGIOGRAPHY N/A 05/24/2017   Procedure: LEFT HEART CATH AND CORS/GRAFTS ANGIOGRAPHY;  Surgeon: Sherren Mocha, MD;  Location: [facility];  Service: Cardiovascular;  Laterality: N/A;  . NASAL SINUS SURGERY    . POLYPECTOMY  01/17/2016   Procedure: POLYPECTOMY;  Surgeon: [name], MD;  Location: [facility];  Service: Endoscopy;;  . RIGHT OOPHORECTOMY    . TEE WITHOUT CARDIOVERSION N/A 02/20/2017   Procedure: TRANSESOPHAGEAL ECHOCARDIOGRAM (TEE);  Surgeon: Grace Isaac, MD;  Location: [facility]  OR;  Service: Open Heart Surgery;  Laterality: N/A;    There were no vitals filed for this visit.  Subjective Assessment - 01/09/19 1812    Subjective  Patient reports she is feeling alright today. States she was sore following last treatment session, but not more than usual. States saturday was a bad day and today has been a good day, but she hasn't done much today and she did a lot Saturday. Reports she is completing her HEP. patient reports having a lot of difficulty choosing answers on the mODI. She states her pain is not as sharp as it was prior to starting physical  therapy and she can do a little bit better with exercise and move a bit better. she rates her pain about 6/10 in her usual locations in the R glute and low back as well as the R groin.    Pertinent History  PMH includes atrial fibrillation, CAD, cancer ([place]), depression, fibromyalgia, HTN, HLD, CVA in 2019, history of MI with CABG in 2018. Patient has had low back pain and hip pain for years, has had therapy in the past with good results. Reported she is most limited in functional abilities, standing and walking.    Limitations  Walking;Sitting;Standing;House hold activities;Lifting    How long can you sit comfortably?  can sit how long she wants if she often bends forward    How long can you stand comfortably?  unable to tolerated standing at all in one position    How long can you walk comfortably?  15 minutes    Diagnostic tests  Diffuse multilevel degenerative change with scoliosis concaveleft. 2 mm retrolisthesis L1 on L2 and L2 on L3. This most likelydegenerative. No acute bony abnormality.Aortoiliac and visceral atherosclerotic vascular disease. Mildectasia of the abdominal aorta can not be excluded. Aorticultrasound can be obtained to further evaluate.    Patient Stated Goals  Patient would like to decrease her pain, improve her ability to move    Currently in Pain?  Yes    Pain Score  6     Pain Location  Back    Pain Orientation  Right    Pain Descriptors / Indicators  Aching;Spasm;Stabbing    Pain Type  Chronic pain    Pain Radiating Towards  right hip/glute/groin    Pain Onset  More than a month ago    Pain Frequency  Constant    Aggravating Factors   standing, walking, sitting for a long time, any exercises    Pain Relieving Factors  rest, hot pack, ice pack    Effect of Pain on Daily Activities  rest, hot pack, ice pack            OBJECTIVE  PATIENT REPORTED OUTCOME MEASURES: Patient Global Rating of Change = a tiny bit better Patient Specific Functional Scale (initial  at 01/09/2019): 1. Walking: 7/10 (01/09/2019) 2. House work (mop,sweep): 3/10 (01/09/2019) 3. Standing in place (longer): 4/10 (01/09/2019) mODI = 49% (pateint reports significant difficulty choosing answers - may not be valid measure for this individual patient).   SENSATION: Light touch sensation impaired on RLE at L4 and S1 (pt relates to vascular surgery), hypersensitve at L3, L5, equal at S2.   Posture Patient with forward head rounded shoulders and slouched in sitting, often shifting weight and changing positions due to discomfort and pain  Gait Grossly WFLs, though does demonstrate aberrant movements with transitional movements/transfers. Intermittently demo antalgic gait favoring R LE.   Palpation Patient tender to palpation  along lumbar paraspinals, bilateral PSIS, R greater trochanter of hip, IT band  Strength (out of 5)  R/L 4+*/4+*Hip flexion 4+/4+ Hip abduction seated position 4/4 Hip adduction seated position, R hip pain 4+/5 Knee extension 5/5 Knee flexion 4+/4+Ankle dorsiflexion *Indicates pain  AROM (degrees) R/L (all movements include overpressure unless otherwise stated) Lumbar forward flexion: able to reach toes and stretch further with effort to place MTP's on floor,  Able to rise without hands. Increased pain following.   *Lumbar extension: 50% extension, and reported significant pain *Lumbar lateral flexion (distance from finger tip to floor): R: 43 cm  L: 41 cm *Lumbar rotation: WFL motion but painful both ways R > L *Indicates pain  Objective measurements completed on examination: See above findings.   TREATMENT: Denies history of spinal surgery Denies sensitivity to latex Denies history of long term prednisone use  Therapeutic exercise:to centralize symptoms and improve ROM, strength, muscular endurance, and activity tolerance required for successful completion of functional activities. - subjective exam and completion of patient reported  outcome measures (unbilled time) -NuStep level1using bilateral upper and lower extremities.Seat/handle setting 6. For improved extremity mobility, muscular endurance, and activity tolerance; and to induce the analgesic effect of aerobic exercise, stimulate improved joint nutrition, and prepare body structures and systems for following interventions. X 7 Min during subjective exam. Distance = 0.25 miles. Average SPM = 63. - objective measurements to assess progress (see above)  HOME EXERCISE PROGRAM Access Code: [email or web address]  URL: [email or web address]  Date: 12/21/2018  Prepared by: Rosita Kea   Exercises   Neutral Curl Up with Straight Leg - 2 sets - 10 reps - 1 breath hold - 2x daily   Side plank - Wall modification - 2 sets - 5 Breaths - 2x daily    PT Education - 01/10/19 2054    Education provided  Yes    Education Details  Exercise purpose/form. Self management techniques. Education on diagnosis, prognosis, POC    Person(s) Educated  Patient    Methods  Explanation;Demonstration    Comprehension  Verbalized understanding       PT Short Term Goals - 12/05/18 1938      PT SHORT TERM GOAL #1   Title  Patient will be compliant with initial HEP in preparation for self management of condition    Baseline  continues with long term HEP from previous episodes of care.    Time  4    Period  Weeks    Status  Achieved    Target Date  12/28/18        PT Long Term Goals - 01/09/19 1823      PT LONG TERM GOAL #1   Title  Pt will decrease mODI scoreby at least 13 points in order demonstrate clinically significant reduction in back pain/disability.    Baseline  54% (11/30/2018); 49% (01/09/2019);    Time  8    Period  Weeks    Status  Partially Met    Target Date  03/06/19      PT LONG TERM GOAL #2   Title  Pt will be independent with HEP in order to improve strength and decrease back pain in order to improve pain-free function at home and work.    Baseline   is currently participating in HEP (01/09/2019);    Time  8    Period  Weeks    Status  Partially Met    Target Date  03/06/19      PT LONG TERM  GOAL #3   Title  Pt will decrease worst back pain as reported on NPRS by at least 2 points in order to demonstrate clinically significant reduction in back pain.    Baseline  worst pain 10/10 11/30/2018; saturday it made her nauseaous, rates at least 8-9/10 (01/09/2019);    Time  8    Period  Weeks    Status  Partially Met    Target Date  03/06/19      PT LONG TERM GOAL #4   Title  Pt will increase strength of by at least 1/2 MMT grade in order to demonstrate improvement in strength and function.    Baseline  see objective of eval 11/30/2018, progress note 01/09/2019;    Time  8    Period  Weeks    Status  Partially Met    Target Date  03/06/19      PT LONG TERM GOAL #5   Title  Patient will report that she is able to walk at least 30mnutes with low back pain/hip pain no greater than 5/10 to improve pts tolerance to endurance activities to increase pt's overall health as well as ability to perform functional activities.    Baseline  ~123mutes 11/30/2018, 01/09/2019;    Time  8    Period  Weeks    Status  On-going    Target Date  03/06/19            Plan - 01/10/19 2052    Clinical Impression Statement  Patient has attended 10 skilled physical therapy treatment sessions this episode of care and has made progress towards most of her goals. She continues to demonstrate good attendance and participation in the clinic. Subjectively, she states she feels that physical therapy is helping her and she can do more with less pain. She rates her improvement on GROC as "a tiny bit better." Objectively, her lumbar ROM has improved with less pain, as has her LE strength. Patient continues to report high and constant levels of pain but demonstrates improvements in activity tolerance and rating of worst pain from 10/10 to 8-9/10.  Patient continues to present  with with significant impairment in spine and hip ROM, LE and core strength/endurance weakness, impaired soft tissue integrity, impaired posture, mobility as well as increased pain with all motions. These limitations significantly limit the patients ability to perform activities such as squatting, standing, walking, lifting, reaching, pulling, sleeping, providing childcare, cooking , cleaning, etc. Patient will benefit from continued skilled physical therapy intervention to address current body structure impairments and activity limitations to improve function and work towards goals set in current POC in order to return to prior level of function or maximal functional improvement.    Personal Factors and Comorbidities  Age;Time since onset of injury/illness/exacerbation;Comorbidity 3+;Profession;Past/Current Experience    Comorbidities  atrial fibrillation, CVA, MI, HTN, HLD, cancer, hypothyrodism    Examination-Activity Limitations  Bathing;Hygiene/Grooming;Squat;Stairs;Lift;Bed Mobility;Bend;Locomotion Level;Stand;Reach Overhead;Caring for Others;Carry;Transfers;Sit;Dressing;Sleep    Examination-Participation Restrictions  Church;Laundry;Shop;Cleaning;Volunteer;Community Activity;Meal Prep;Yard Work;Driving    Stability/Clinical Decision Making  Stable/Uncomplicated    Rehab Potential  Fair    Clinical Impairments Affecting Rehab Potential  (+)motivated, previous improvement with physical therapy(-)chronic condition, pain, recent CABG x 3 02/20/2017, arthritis, HTN, CVA    PT Frequency  2x / week    PT Duration  8 weeks    PT Treatment/Interventions  Cryotherapy;Electrical Stimulation;Moist Heat;Therapeutic exercise;Therapeutic activities;Neuromuscular re-education;Patient/family education;Manual techniques;Balance training;Canalith Repostioning;Traction;Dry needling;Joint Manipulations;Spinal Manipulations;Splinting;Taping;Energy conservation;Gait training;Stair training;Ultrasound;Functional mobility  training;Passive range of motion;Vestibular;Aquatic Therapy;ADLs/Self Care  Home Management    PT Next Visit Plan  continue hip and functional strengthening/activities as tolerated.    PT Home Exercise Plan  Medbridge Access Code: [email or web address]; Pt reports she performs seated heel/toe raises, SLR, seated marching, heel slides, demonstrated good knowledge of log rolling technique    Consulted and Agree with Plan of Care  Patient       Patient will benefit from skilled therapeutic intervention in order to improve the following deficits and impairments:  Pain, Improper body mechanics, Increased muscle spasms, Decreased activity tolerance, Decreased endurance, Decreased range of motion, Decreased strength, Impaired perceived functional ability, Postural dysfunction, Decreased mobility, Difficulty walking, Impaired flexibility  Visit Diagnosis: 1. Muscle weakness (generalized)   2. Muscle spasm of back   3. Difficulty in walking, not elsewhere classified   4. Low back pain with sciatica, sciatica laterality unspecified, unspecified back pain laterality, unspecified chronicity   5. Pain in right hip        Problem List Patient Active Problem List   Diagnosis Date Noted  . Chest pain 10/26/2018  . TIA (transient ischemic attack) 03/21/2018  . CVA (cerebral vascular accident) ([place]) 10/22/2017  . Abnormal nuclear stress test 05/24/2017  . 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 PAF (paroxysmal atrial fibrillation) ([place])   . Non-STEMI (non-ST elevated myocardial infarction) ([place]) 02/20/2017  . 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 Moderate mitral insufficiency 01/11/2017  . Personal history of colonic polyps   . Benign neoplasm of ascending colon   . Benign neoplasm of descending colon   . Disequilibrium 09/16/2015  . Panic attacks 09/11/2015  . Ganglion cyst 08/16/2015  . Hypernatremia 07/04/2015  . Upper back pain 07/04/2015  . Other osteoarthritis  involving multiple joints 02/06/2015  . Clinical depression 09/27/2014  . Dry mouth 09/27/2014  . Fibrositis 09/27/2014  . LBP (low back pain) 09/27/2014  . Avitaminosis D 09/27/2014  . Decreased leukocytes 09/27/2014  . Abnormal blood sugar 05/03/2009  . Insomnia 09/29/2007  . HTN (hypertension) 03/09/2007  . Adaptation reaction 01/29/2007  . Acid reflux 10/07/2006  . Menopausal and postmenopausal disorder 10/05/2006  . Headache, migraine 10/05/2006  . Arthritis, degenerative 03/23/1994  . Adult hypothyroidism 04/15/1993  . Hyperlipidemia LDL goal <70 03/25/1993    Everlean Alstrom. Graylon Good, PT, DPT 01/10/19, 8:54 PM  [facility] [address], [place], [address] Phone: [phone]   Fax:  [phone]  Name: Stacey [name] MRN: [id number] Date of Birth: 1941/01/16

## 2019-01-11 ENCOUNTER — Other Ambulatory Visit: Payer: Self-pay

## 2019-01-11 ENCOUNTER — Ambulatory Visit: Payer: Medicare Other | Admitting: Physical Therapy

## 2019-01-11 ENCOUNTER — Encounter: Payer: Self-pay | Admitting: Physical Therapy

## 2019-01-11 DIAGNOSIS — M62830 Muscle spasm of back: Secondary | ICD-10-CM | POA: Diagnosis not present

## 2019-01-11 DIAGNOSIS — M6281 Muscle weakness (generalized): Secondary | ICD-10-CM | POA: Diagnosis not present

## 2019-01-11 DIAGNOSIS — M5440 Lumbago with sciatica, unspecified side: Secondary | ICD-10-CM | POA: Diagnosis not present

## 2019-01-11 DIAGNOSIS — R262 Difficulty in walking, not elsewhere classified: Secondary | ICD-10-CM

## 2019-01-11 DIAGNOSIS — M25551 Pain in right hip: Secondary | ICD-10-CM

## 2019-01-11 NOTE — Therapy (Signed)
[facility] [address] [place], [address] Phone: [phone]   Fax:  [phone]  Physical Therapy Treatment  Patient Details  Name: Stacey [name] MRN: [id number] Date of Birth: 1941/01/03 Referring Provider (PT): Miguel Aschoff   Encounter Date: 01/11/2019  PT End of Session - 01/11/19 1808    Visit Number  11    Number of Visits  26    Date for PT Re-Evaluation  03/06/19    Authorization Type  Medicare reporting period from 01/09/2019);    Authorization Time Period  current cert period 0/93/8182 - 03/06/2019 (last PN: 01/09/2019);    Authorization - Visit Number  1    Authorization - Number of Visits  10    PT Start Time  1800    PT Stop Time  1840    PT Time Calculation (min)  40 min    Activity Tolerance  Patient tolerated treatment well;Patient limited by pain    Behavior During Therapy  [facility] for tasks assessed/performed       Past Medical History:  Diagnosis Date  . Abnormal nuclear stress test 05/24/2017  . Arthritis   . Asthma   . Atrial fibrillation, transient ([place])   . 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 Cancer ([place])   . Complication of anesthesia   . Depression   . Family history of adverse reaction to anesthesia    most of family - PONV  . Fibromyalgia   . Glaucoma   . Headache    migraines - 1-2x/mo  . HTN (hypertension) 03/09/2007  . Hyperlipidemia LDL goal <70 03/25/1993  . Hypertension   . Motion sickness    all moving vehicles  . PONV (postoperative nausea and vomiting)   . Stroke ([place]) 2019  . Thyroid disease     Past Surgical History:  Procedure Laterality Date  . ABDOMINAL HYSTERECTOMY    . APPENDECTOMY    . BLADDER SURGERY    . CATARACT EXTRACTION W/ INTRAOCULAR LENS  IMPLANT, BILATERAL    . COLONOSCOPY WITH PROPOFOL N/A 01/17/2016   Procedure: COLONOSCOPY WITH PROPOFOL;  Surgeon: [name], MD;  Location:  [facility];  Service: Endoscopy;  Laterality: N/A;  . CORONARY ARTERY BYPASS GRAFT N/A 02/20/2017   Procedure: CORONARY ARTERY BYPASS GRAFTING (CABG) x , three using left internal mammary artery to left anterior descending coronary artery and right greater saphenous vein harvested endoscopically to distal right and diagonal coronary arteries.;  Surgeon: Grace Isaac, MD;  Location: [place] [facility];  Service: Open Heart Surgery;  Laterality: N/A;  . LEFT HEART CATH AND CORONARY ANGIOGRAPHY N/A 02/20/2017   Procedure: LEFT HEART CATH AND CORONARY ANGIOGRAPHY;  Surgeon: Isaias Cowman, MD;  Location: [facility];  Service: Cardiovascular;  Laterality: N/A;  . LEFT HEART CATH AND CORS/GRAFTS ANGIOGRAPHY N/A 05/24/2017   Procedure: LEFT HEART CATH AND CORS/GRAFTS ANGIOGRAPHY;  Surgeon: Sherren Mocha, MD;  Location: [facility];  Service: Cardiovascular;  Laterality: N/A;  . NASAL SINUS SURGERY    . POLYPECTOMY  01/17/2016   Procedure: POLYPECTOMY;  Surgeon: [name], MD;  Location: [facility];  Service: Endoscopy;;  . RIGHT OOPHORECTOMY    . TEE WITHOUT CARDIOVERSION N/A 02/20/2017   Procedure: TRANSESOPHAGEAL ECHOCARDIOGRAM (TEE);  Surgeon: Grace Isaac, MD;  Location: [facility];  Service: Open Heart Surgery;  Laterality: N/A;    There were no vitals  filed for this visit.  Subjective Assessment - 01/11/19 1806    Subjective  Patient reports she is feeling okay today and her pain is moderate. States she felt okay following last treatment session. She does not have a numeric pain rating today.    Pertinent History  PMH includes atrial fibrillation, CAD, cancer (skin), depression, fibromyalgia, HTN, HLD, CVA in 2019, history of MI with CABG in 2018. Patient has had low back pain and hip pain for years, has had therapy in the past with good results. Reported she is most limited in functional abilities, standing and walking.    Limitations   Walking;Sitting;Standing;House hold activities;Lifting    How long can you sit comfortably?  can sit how long she wants if she often bends forward    How long can you stand comfortably?  unable to tolerated standing at all in one position    How long can you walk comfortably?  15 minutes    Diagnostic tests  Diffuse multilevel degenerative change with scoliosis concaveleft. 2 mm retrolisthesis L1 on L2 and L2 on L3. This most likelydegenerative. No acute bony abnormality.Aortoiliac and visceral atherosclerotic vascular disease. Mildectasia of the abdominal aorta can not be excluded. Aorticultrasound can be obtained to further evaluate.    Patient Stated Goals  Patient would like to decrease her pain, improve her ability to move    Currently in Pain?  Yes    Pain Onset  More than a month ago         TREATMENT: Denies history of spinal surgery Denies sensitivity to latex Denies history of long term prednisone use  Therapeutic exercise:to centralize symptoms and improve ROM, strength, muscular endurance, and activity tolerance required for successful completion of functional activities. -NuStep level2using bilateral upper and lower extremities.Seat/handle setting 6. For improved extremity mobility, muscular endurance, and activity tolerance; and to induce the analgesic effect of aerobic exercise, stimulate improved joint nutrition, and prepare body structures and systems for following interventions. X 10 Min during subjective exam. Distance = 0.46 miles. Average SPM = 83.  - soccer kicking back and forth using alternating feet from different angles to improve core and LE strength, endurance, and balance and improve standing tolerance. X 5 min  - standing clubbing of balls of various sizes around the clinic with weighted metal pole to improve dynamic trunk strength and LE stabililty, strength, balance, and standing tolerance. X 5 min.  - baseball swing x 10 each directions using weighted  metal pole and small green children's ball for lumbar rotation in functional and enjoyable context for improved activity tolerance and decreased guarding.  - hooklying LTR to relax back muscles and improve abdominal activation x 20 with small pink theraball under legs. Difficult at first but improved with motion.  (manual therapy - see below) - hooklying abdominal brace holding small pink theraball going through extremity motion sequence (single knee to ball, reach ball overhead while extending LE towards opposite wall, single knee to ball tap, lower foot to floor) alternating sides x 20. Patient with difficulty with coordination at first but was able to master by end of set.  - hooklying bridges x 5, but produced more pain than desirable. discontineued - hooklying pelvic tilts x 20 to improve lumbar motion and spinal posture awareness prior to attemtping bridge with PPT.  - hooklying bridge with PPT x 5 - pt reports it feels no different than previous bridge attempt. Discontinued for this visit.  - modified quadruped cat cow with elbows on bolster  to decrease wrist discomfort with position. Patient report extension very painful in lumbar spine and is visibly in pain. Discontinued.  - Standing cervical thoracic extension/BUE flexion and serratus anterior activation, lat stretch, with foam roller up wall, 5 second holds, cuing for technique. x 15 reps plus time for instruction, transitions, and appropriate rest. Patient reports improved tolerance compared to cat-cow exercise.   Manual therapy:to reduce pain and tissue tension, improve range of motion, neuromodulation, in order to promote improved ability to complete functional activities. Patient in hooklying position with pillow under head.  - long axis distraction through R hip - painful so discontinued - hooklying R hip distraction - continued to be similarly painful to long axis distraction so discontinued.    HOME EXERCISE PROGRAM Access  Code: [email or web address]  URL: [email or web address]  Date: 12/21/2018  Prepared by: Rosita Kea   Exercises   Neutral Curl Up with Straight Leg - 2 sets - 10 reps - 1 breath hold - 2x daily   Side plank - Wall modification - 2 sets - 5 Breaths - 2x daily    PT Education - 01/11/19 1808    Education provided  Yes    Education Details  Exercise purpose/form. Self management techniques.    Person(s) Educated  Patient    Methods  Explanation;Demonstration;Verbal cues    Comprehension  Verbalized understanding;Returned demonstration       PT Short Term Goals - 12/05/18 1938      PT SHORT TERM GOAL #1   Title  Patient will be compliant with initial HEP in preparation for self management of condition    Baseline  continues with long term HEP from previous episodes of care.    Time  4    Period  Weeks    Status  Achieved    Target Date  12/28/18        PT Long Term Goals - 01/09/19 1823      PT LONG TERM GOAL #1   Title  Pt will decrease mODI scoreby at least 13 points in order demonstrate clinically significant reduction in back pain/disability.    Baseline  54% (11/30/2018); 49% (01/09/2019);    Time  8    Period  Weeks    Status  Partially Met    Target Date  03/06/19      PT LONG TERM GOAL #2   Title  Pt will be independent with HEP in order to improve strength and decrease back pain in order to improve pain-free function at home and work.    Baseline  is currently participating in HEP (01/09/2019);    Time  8    Period  Weeks    Status  Partially Met    Target Date  03/06/19      PT LONG TERM GOAL #3   Title  Pt will decrease worst back pain as reported on NPRS by at least 2 points in order to demonstrate clinically significant reduction in back pain.    Baseline  worst pain 10/10 11/30/2018; saturday it made her nauseaous, rates at least 8-9/10 (01/09/2019);    Time  8    Period  Weeks    Status  Partially Met    Target Date  03/06/19      PT LONG TERM GOAL  #4   Title  Pt will increase strength of by at least 1/2 MMT grade in order to demonstrate improvement in strength and function.    Baseline  see objective of eval 11/30/2018,  progress note 01/09/2019;    Time  8    Period  Weeks    Status  Partially Met    Target Date  03/06/19      PT LONG TERM GOAL #5   Title  Patient will report that she is able to walk at least 67mnutes with low back pain/hip pain no greater than 5/10 to improve pts tolerance to endurance activities to increase pt's overall health as well as ability to perform functional activities.    Baseline  ~154mutes 11/30/2018, 01/09/2019;    Time  8    Period  Weeks    Status  On-going    Target Date  03/06/19            Plan - 01/11/19 2008    Clinical Impression Statement  Patient tolerated treatment well today. She showed good tolerance for standing exercises and improved balance and confidence with kicking and swinging exercises. Continues to have difficulty holding her breath during exercises where she is focused on proper completion. More natural motion noted during kicking and swinging activities. Did not tolerate bridging or cat-cow exercise well so discontinued and patient reported elevated pain during those activities resolved by end of session. Patient did not respond well to manual therapy for the R hip, so it was discontinued. Increased resistance and time on aerobic activities was provided for improved analgesic effect. She continues to be limited by chronic pain, low activity tolerance, decreased endurance, and strength and ROM deficits. Patient would benefit from continued physical therapy to address remaining impairments and functional limitations to work towards stated goals and return to PLOF or maximal functional independence.    Personal Factors and Comorbidities  Age;Time since onset of injury/illness/exacerbation;Comorbidity 3+;Profession;Past/Current Experience    Comorbidities  atrial fibrillation, CVA, MI,  HTN, HLD, cancer, hypothyrodism    Examination-Activity Limitations  Bathing;Hygiene/Grooming;Squat;Stairs;Lift;Bed Mobility;Bend;Locomotion Level;Stand;Reach Overhead;Caring for Others;Carry;Transfers;Sit;Dressing;Sleep    Examination-Participation Restrictions  Church;Laundry;Shop;Cleaning;Volunteer;Community Activity;Meal Prep;Yard Work;Driving    Stability/Clinical Decision Making  Stable/Uncomplicated    Rehab Potential  Fair    Clinical Impairments Affecting Rehab Potential  (+)motivated, previous improvement with physical therapy(-)chronic condition, pain, recent CABG x 3 02/20/2017, arthritis, HTN, CVA    PT Frequency  2x / week    PT Duration  8 weeks    PT Treatment/Interventions  Cryotherapy;Electrical Stimulation;Moist Heat;Therapeutic exercise;Therapeutic activities;Neuromuscular re-education;Patient/family education;Manual techniques;Balance training;Canalith Repostioning;Traction;Dry needling;Joint Manipulations;Spinal Manipulations;Splinting;Taping;Energy conservation;Gait training;Stair training;Ultrasound;Functional mobility training;Passive range of motion;Vestibular;Aquatic Therapy;ADLs/Self Care Home Management    PT Next Visit Plan  continue hip and functional strengthening/activities as tolerated.    PT Home Exercise Plan  Medbridge Access Code: [email or web address]; Pt reports she performs seated heel/toe raises, SLR, seated marching, heel slides, demonstrated good knowledge of log rolling technique    Consulted and Agree with Plan of Care  Patient       Patient will benefit from skilled therapeutic intervention in order to improve the following deficits and impairments:  Pain, Improper body mechanics, Increased muscle spasms, Decreased activity tolerance, Decreased endurance, Decreased range of motion, Decreased strength, Impaired perceived functional ability, Postural dysfunction, Decreased mobility, Difficulty walking, Impaired flexibility  Visit Diagnosis: 1. Muscle weakness  (generalized)   2. Muscle spasm of back   3. Difficulty in walking, not elsewhere classified   4. Low back pain with sciatica, sciatica laterality unspecified, unspecified back pain laterality, unspecified chronicity   5. Pain in right hip        Problem List Patient Active Problem List   Diagnosis Date Noted  .  Chest pain 10/26/2018  . TIA (transient ischemic attack) 03/21/2018  . CVA (cerebral vascular accident) ([place]) 10/22/2017  . Abnormal nuclear stress test 05/24/2017  . 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 PAF (paroxysmal atrial fibrillation) ([place])   . Non-STEMI (non-ST elevated myocardial infarction) ([place]) 02/20/2017  . 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 Moderate mitral insufficiency 01/11/2017  . Personal history of colonic polyps   . Benign neoplasm of ascending colon   . Benign neoplasm of descending colon   . Disequilibrium 09/16/2015  . Panic attacks 09/11/2015  . Ganglion cyst 08/16/2015  . Hypernatremia 07/04/2015  . Upper back pain 07/04/2015  . Other osteoarthritis involving multiple joints 02/06/2015  . Clinical depression 09/27/2014  . Dry mouth 09/27/2014  . Fibrositis 09/27/2014  . LBP (low back pain) 09/27/2014  . Avitaminosis D 09/27/2014  . Decreased leukocytes 09/27/2014  . Abnormal blood sugar 05/03/2009  . Insomnia 09/29/2007  . HTN (hypertension) 03/09/2007  . Adaptation reaction 01/29/2007  . Acid reflux 10/07/2006  . Menopausal and postmenopausal disorder 10/05/2006  . Headache, migraine 10/05/2006  . Arthritis, degenerative 03/23/1994  . Adult hypothyroidism 04/15/1993  . Hyperlipidemia LDL goal <70 03/25/1993    Everlean Alstrom. Graylon Good, PT, DPT 01/11/19, 8:08 PM  [facility] [address], [place], [address] Phone: [phone]   Fax:  [phone]  Name: Stacey [name] MRN: [id number] Date of Birth: June 17, 1940

## 2019-01-16 ENCOUNTER — Ambulatory Visit: Payer: Medicare Other | Admitting: Physical Therapy

## 2019-01-16 ENCOUNTER — Encounter: Payer: Self-pay | Admitting: Physical Therapy

## 2019-01-16 ENCOUNTER — Other Ambulatory Visit: Payer: Self-pay

## 2019-01-16 DIAGNOSIS — M62830 Muscle spasm of back: Secondary | ICD-10-CM | POA: Diagnosis not present

## 2019-01-16 DIAGNOSIS — M25551 Pain in right hip: Secondary | ICD-10-CM | POA: Diagnosis not present

## 2019-01-16 DIAGNOSIS — R262 Difficulty in walking, not elsewhere classified: Secondary | ICD-10-CM

## 2019-01-16 DIAGNOSIS — M5440 Lumbago with sciatica, unspecified side: Secondary | ICD-10-CM | POA: Diagnosis not present

## 2019-01-16 DIAGNOSIS — M6281 Muscle weakness (generalized): Secondary | ICD-10-CM

## 2019-01-16 NOTE — Therapy (Signed)
[facility] [address], [place], [address] Phone: [phone]   Fax:  [phone]  Physical Therapy Treatment  Patient Details  Name: Stacey [name] MRN: [id number] Date of Birth: [birth year] Referring Provider (PT): Miguel Aschoff   Encounter Date: 01/16/2019  PT End of Session - 01/16/19 1804    Visit Number  12    Number of Visits  26    Date for PT Re-Evaluation  03/06/19    Authorization Type  Medicare reporting period from 01/09/2019);    Authorization Time Period  current cert period 08/21/760 - 03/06/2019 (last PN: 01/09/2019);    Authorization - Visit Number  2    Authorization - Number of Visits  10    PT Start Time  1800    PT Stop Time  1840    PT Time Calculation (min)  40 min    Activity Tolerance  Patient tolerated treatment well;Patient limited by pain    Behavior During Therapy  [facility] for tasks assessed/performed       Past Medical History:  Diagnosis Date  . Abnormal nuclear stress test 05/24/2017  . Arthritis   . Asthma   . Atrial fibrillation, transient ([place])   . 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 Cancer ([place])   . Complication of anesthesia   . Depression   . Family history of adverse reaction to anesthesia    most of family - PONV  . Fibromyalgia   . Glaucoma   . Headache    migraines - 1-2x/mo  . HTN (hypertension) 03/09/2007  . Hyperlipidemia LDL goal <70 03/25/1993  . Hypertension   . Motion sickness    all moving vehicles  . PONV (postoperative nausea and vomiting)   . Stroke ([place]) 2019  . Thyroid disease     Past Surgical History:  Procedure Laterality Date  . ABDOMINAL HYSTERECTOMY    . APPENDECTOMY    . BLADDER SURGERY    . CATARACT EXTRACTION W/ INTRAOCULAR LENS  IMPLANT, BILATERAL    . COLONOSCOPY WITH PROPOFOL N/A 01/17/2016   Procedure: COLONOSCOPY WITH PROPOFOL;  Surgeon: [name], MD;  Location:  [place];  Service: Endoscopy;  Laterality: N/A;  . CORONARY ARTERY BYPASS GRAFT N/A 02/20/2017   Procedure: CORONARY ARTERY BYPASS GRAFTING (CABG) x , three using left internal mammary artery to left anterior descending coronary artery and right greater saphenous vein harvested endoscopically to distal right and diagonal coronary arteries.;  Surgeon: Grace Isaac, MD;  Location: [place];  Service: Open Heart Surgery;  Laterality: N/A;  . LEFT HEART CATH AND CORONARY ANGIOGRAPHY N/A 02/20/2017   Procedure: LEFT HEART CATH AND CORONARY ANGIOGRAPHY;  Surgeon: Isaias Cowman, MD;  Location: [place] [facility];  Service: Cardiovascular;  Laterality: N/A;  . LEFT HEART CATH AND CORS/GRAFTS ANGIOGRAPHY N/A 05/24/2017   Procedure: LEFT HEART CATH AND CORS/GRAFTS ANGIOGRAPHY;  Surgeon: Sherren Mocha, MD;  Location: [facility] [facility];  Service: Cardiovascular;  Laterality: N/A;  . NASAL SINUS SURGERY    . POLYPECTOMY  01/17/2016   Procedure: POLYPECTOMY;  Surgeon: [name], MD;  Location: [facility];  Service: Endoscopy;;  . RIGHT OOPHORECTOMY    . TEE WITHOUT CARDIOVERSION N/A 02/20/2017   Procedure: TRANSESOPHAGEAL ECHOCARDIOGRAM (TEE);  Surgeon: Grace Isaac, MD;  Location: [facility];  Service: Open Heart Surgery;  Laterality: N/A;    There were no vitals  filed for this visit.  Subjective Assessment - 01/16/19 1803    Subjective  Patient reports she is feeling pretty good today with pian 5-6/10 in the R glute region. States she felt pretty good following last treatment session. She said yesterday was a bad day, but she is unsure why. She had pain all over.    Pertinent History  PMH includes atrial fibrillation, CAD, cancer (skin), depression, fibromyalgia, HTN, HLD, CVA in 2019, history of MI with CABG in 2018. Patient has had low back pain and hip pain for years, has had therapy in the past with good results. Reported she is most limited in functional abilities,  standing and walking.    Limitations  Walking;Sitting;Standing;House hold activities;Lifting    How long can you sit comfortably?  can sit how long she wants if she often bends forward    How long can you stand comfortably?  unable to tolerated standing at all in one position    How long can you walk comfortably?  15 minutes    Diagnostic tests  Diffuse multilevel degenerative change with scoliosis concaveleft. 2 mm retrolisthesis L1 on L2 and L2 on L3. This most likelydegenerative. No acute bony abnormality.Aortoiliac and visceral atherosclerotic vascular disease. Mildectasia of the abdominal aorta can not be excluded. Aorticultrasound can be obtained to further evaluate.    Patient Stated Goals  Patient would like to decrease her pain, improve her ability to move    Currently in Pain?  Yes    Pain Score  6     Pain Location  Back    Pain Orientation  Right    Pain Onset  More than a month ago       TREATMENT: Denies history of spinal surgery Denies sensitivity to latex Denies history of long term prednisone use  Therapeutic exercise:to centralize symptoms and improve ROM, strength, muscular endurance, and activity tolerance required for successful completion of functional activities. -NuStep level3using bilateral upper and lower extremities.Seat/handle setting 6. For improved extremity mobility, muscular endurance, and activity tolerance; and to induce the analgesic effect of aerobic exercise, stimulate improved joint nutrition, and prepare body structures and systems for following interventions. X7.5Min during subjective exam.Distance = 0.34 miles. Average SPM = 80.  - Standing cervical thoracic extension/BUE flexion and serratus anterior activation, lat stretch, with rust colored cylindrical bolster up wall, 5 second holds, cuing for technique. 2x10 reps plus time for instruction, transitions, and appropriate rest. Patient reports improved tolerance compared to cat-cow  exercise. - seated lat pull with isolated scapular depression/elevation. 2x10,10# and 15#.  - step up to 4 inch step with contralateral forward punch against red theraband with handle in it. 2x10  With CGA and gait belt for safety.  - Standing rows with scapular retraction for improved postural and shoulder girdle strengthening and mobility. Required instruction for technique and cuing to retract, posteriorly tilt, and depress scapulae. Red theraband 2 x 20 each side.  - Standing pallof press (multifidus press) with single red theraband 2x10 each side. Cuing for trunk control and breathing.  - hooklying abdominal brace holding small pink theraball going through extremity motion sequence (single knee to ball, reach ball overhead while extending LE towards opposite wall, single knee to ball tap, lower foot to floor) alternating sides x 10. Patient with difficulty with coordination at first but was able to master by end of set.  - baseball swing 2x 10 each direction, club swing x 10-15 each side, double hand taps x 15 using weighted metal  pole and small green children's ball for lumbar rotation in functional and enjoyable context for improved activity tolerance and decreased guarding.  HOME EXERCISE PROGRAM Access Code: [email or web address]  URL: [email or web address]  Date: 12/21/2018  Prepared by: Rosita Kea   Exercises   Neutral Curl Up with Straight Leg - 2 sets - 10 reps - 1 breath hold - 2x daily   Side plank - Wall modification - 2 sets - 5 Breaths - 2x daily    PT Education - 01/16/19 1804    Education provided  Yes    Education Details  Exercise purpose/form. Self management techniques.    Person(s) Educated  Patient    Methods  Explanation;Demonstration    Comprehension  Verbalized understanding;Returned demonstration       PT Short Term Goals - 12/05/18 1938      PT SHORT TERM GOAL #1   Title  Patient will be compliant with initial HEP in preparation for self  management of condition    Baseline  continues with long term HEP from previous episodes of care.    Time  4    Period  Weeks    Status  Achieved    Target Date  12/28/18        PT Long Term Goals - 01/09/19 1823      PT LONG TERM GOAL #1   Title  Pt will decrease mODI scoreby at least 13 points in order demonstrate clinically significant reduction in back pain/disability.    Baseline  54% (11/30/2018); 49% (01/09/2019);    Time  8    Period  Weeks    Status  Partially Met    Target Date  03/06/19      PT LONG TERM GOAL #2   Title  Pt will be independent with HEP in order to improve strength and decrease back pain in order to improve pain-free function at home and work.    Baseline  is currently participating in HEP (01/09/2019);    Time  8    Period  Weeks    Status  Partially Met    Target Date  03/06/19      PT LONG TERM GOAL #3   Title  Pt will decrease worst back pain as reported on NPRS by at least 2 points in order to demonstrate clinically significant reduction in back pain.    Baseline  worst pain 10/10 11/30/2018; saturday it made her nauseaous, rates at least 8-9/10 (01/09/2019);    Time  8    Period  Weeks    Status  Partially Met    Target Date  03/06/19      PT LONG TERM GOAL #4   Title  Pt will increase strength of by at least 1/2 MMT grade in order to demonstrate improvement in strength and function.    Baseline  see objective of eval 11/30/2018, progress note 01/09/2019;    Time  8    Period  Weeks    Status  Partially Met    Target Date  03/06/19      PT LONG TERM GOAL #5   Title  Patient will report that she is able to walk at least 57mnutes with low back pain/hip pain no greater than 5/10 to improve pts tolerance to endurance activities to increase pt's overall health as well as ability to perform functional activities.    Baseline  ~142mutes 11/30/2018, 01/09/2019;    Time  8    Period  Weeks  Status  On-going    Target Date  03/06/19             Plan - 01/16/19 1931    Clinical Impression Statement  Patient tolerated treatment well and reported step ups to be the most challenging activity. She reported tolerable pain with all activities and demonstrated improved motor control with cuing. Patient had great difficulty isolating scapular motion but showed improved posture following lat pull down exercise. She continues to be limited by chronic pain, low activity tolerance, decreased endurance, and strength and ROM deficits. Patient would benefit from continued physical therapy to address remaining impairments and functional limitations to work towards stated goals and return to PLOF or maximal functional independence.    Personal Factors and Comorbidities  Age;Time since onset of injury/illness/exacerbation;Comorbidity 3+;Profession;Past/Current Experience    Comorbidities  atrial fibrillation, CVA, MI, HTN, HLD, cancer, hypothyrodism    Examination-Activity Limitations  Bathing;Hygiene/Grooming;Squat;Stairs;Lift;Bed Mobility;Bend;Locomotion Level;Stand;Reach Overhead;Caring for Others;Carry;Transfers;Sit;Dressing;Sleep    Examination-Participation Restrictions  Church;Laundry;Shop;Cleaning;Volunteer;Community Activity;Meal Prep;Yard Work;Driving    Stability/Clinical Decision Making  Stable/Uncomplicated    Rehab Potential  Fair    Clinical Impairments Affecting Rehab Potential  (+)motivated, previous improvement with physical therapy(-)chronic condition, pain, recent CABG x 3 02/20/2017, arthritis, HTN, CVA    PT Frequency  2x / week    PT Duration  8 weeks    PT Treatment/Interventions  Cryotherapy;Electrical Stimulation;Moist Heat;Therapeutic exercise;Therapeutic activities;Neuromuscular re-education;Patient/family education;Manual techniques;Balance training;Canalith Repostioning;Traction;Dry needling;Joint Manipulations;Spinal Manipulations;Splinting;Taping;Energy conservation;Gait training;Stair training;Ultrasound;Functional  mobility training;Passive range of motion;Vestibular;Aquatic Therapy;ADLs/Self Care Home Management    PT Next Visit Plan  continue hip and functional strengthening/activities as tolerated.    PT Home Exercise Plan  Medbridge Access Code: [email or web address]; Pt reports she performs seated heel/toe raises, SLR, seated marching, heel slides, demonstrated good knowledge of log rolling technique    Consulted and Agree with Plan of Care  Patient       Patient will benefit from skilled therapeutic intervention in order to improve the following deficits and impairments:  Pain, Improper body mechanics, Increased muscle spasms, Decreased activity tolerance, Decreased endurance, Decreased range of motion, Decreased strength, Impaired perceived functional ability, Postural dysfunction, Decreased mobility, Difficulty walking, Impaired flexibility  Visit Diagnosis: Muscle weakness (generalized)  Muscle spasm of back  Difficulty in walking, not elsewhere classified  Low back pain with sciatica, sciatica laterality unspecified, unspecified back pain laterality, unspecified chronicity  Pain in right hip     Problem List Patient Active Problem List   Diagnosis Date Noted  . Chest pain 10/26/2018  . TIA (transient ischemic attack) 03/21/2018  . CVA (cerebral vascular accident) ([place]) 10/22/2017  . Abnormal nuclear stress test 05/24/2017  . 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 PAF (paroxysmal atrial fibrillation) ([place])   . Non-STEMI (non-ST elevated myocardial infarction) ([place]) 02/20/2017  . 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 Moderate mitral insufficiency 01/11/2017  . Personal history of colonic polyps   . Benign neoplasm of ascending colon   . Benign neoplasm of descending colon   . Disequilibrium 09/16/2015  . Panic attacks 09/11/2015  . Ganglion cyst 08/16/2015  . Hypernatremia 07/04/2015  . Upper back pain 07/04/2015  . Other osteoarthritis involving  multiple joints 02/06/2015  . Clinical depression 09/27/2014  . Dry mouth 09/27/2014  . Fibrositis 09/27/2014  . LBP (low back pain) 09/27/2014  . Avitaminosis D 09/27/2014  . Decreased leukocytes 09/27/2014  . Abnormal blood sugar 05/03/2009  . Insomnia 09/29/2007  . HTN (hypertension) 03/09/2007  .  Adaptation reaction 01/29/2007  . Acid reflux 10/07/2006  . Menopausal and postmenopausal disorder 10/05/2006  . Headache, migraine 10/05/2006  . Arthritis, degenerative 03/23/1994  . Adult hypothyroidism 04/15/1993  . Hyperlipidemia LDL goal <70 03/25/1993    Everlean Alstrom. Graylon Good, PT, DPT 01/16/19, 7:32 PM  [facility] [address] [place], [address] Phone: [phone]   Fax:  [phone]  Name: Stacey [name] MRN: [id number] Date of Birth: [birth year]

## 2019-01-18 ENCOUNTER — Other Ambulatory Visit: Payer: Self-pay

## 2019-01-18 ENCOUNTER — Ambulatory Visit: Payer: Medicare Other | Admitting: Physical Therapy

## 2019-01-18 ENCOUNTER — Encounter: Payer: Self-pay | Admitting: Physical Therapy

## 2019-01-18 DIAGNOSIS — R262 Difficulty in walking, not elsewhere classified: Secondary | ICD-10-CM

## 2019-01-18 DIAGNOSIS — M5440 Lumbago with sciatica, unspecified side: Secondary | ICD-10-CM

## 2019-01-18 DIAGNOSIS — M25551 Pain in right hip: Secondary | ICD-10-CM | POA: Diagnosis not present

## 2019-01-18 DIAGNOSIS — M6281 Muscle weakness (generalized): Secondary | ICD-10-CM

## 2019-01-18 DIAGNOSIS — M62830 Muscle spasm of back: Secondary | ICD-10-CM | POA: Diagnosis not present

## 2019-01-18 NOTE — Therapy (Signed)
[facility] [address] [place], [address] Phone: [phone]   Fax:  [phone]  Physical Therapy Treatment  Patient Details  Name: Stacey [name] MRN: [id number] Date of Birth: [birth year] Referring Provider (PT): Miguel Aschoff   Encounter Date: 01/18/2019  PT End of Session - 01/18/19 1808    Visit Number  13    Number of Visits  26    Date for PT Re-Evaluation  03/06/19    Authorization Type  Medicare reporting period from 01/09/2019);    Authorization Time Period  current cert period 6/81/2751 - 03/06/2019 (last PN: 01/09/2019);    Authorization - Visit Number  3    Authorization - Number of Visits  10    PT Start Time  1800    PT Stop Time  1840    PT Time Calculation (min)  40 min    Activity Tolerance  Patient tolerated treatment well;Patient limited by pain    Behavior During Therapy  [facility] for tasks assessed/performed       Past Medical History:  Diagnosis Date  . Abnormal nuclear stress test 05/24/2017  . Arthritis   . Asthma   . Atrial fibrillation, transient ([place])   . 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 Cancer ([place])   . Complication of anesthesia   . Depression   . Family history of adverse reaction to anesthesia    most of family - PONV  . Fibromyalgia   . Glaucoma   . Headache    migraines - 1-2x/mo  . HTN (hypertension) 03/09/2007  . Hyperlipidemia LDL goal <70 03/25/1993  . Hypertension   . Motion sickness    all moving vehicles  . PONV (postoperative nausea and vomiting)   . Stroke ([place]) 2019  . Thyroid disease     Past Surgical History:  Procedure Laterality Date  . ABDOMINAL HYSTERECTOMY    . APPENDECTOMY    . BLADDER SURGERY    . CATARACT EXTRACTION W/ INTRAOCULAR LENS  IMPLANT, BILATERAL    . COLONOSCOPY WITH PROPOFOL N/A 01/17/2016   Procedure: COLONOSCOPY WITH PROPOFOL;  Surgeon: [name], MD;  Location:  [facility];  Service: Endoscopy;  Laterality: N/A;  . CORONARY ARTERY BYPASS GRAFT N/A 02/20/2017   Procedure: CORONARY ARTERY BYPASS GRAFTING (CABG) x , three using left internal mammary artery to left anterior descending coronary artery and right greater saphenous vein harvested endoscopically to distal right and diagonal coronary arteries.;  Surgeon: Grace Isaac, MD;  Location: [facility];  Service: Open Heart Surgery;  Laterality: N/A;  . LEFT HEART CATH AND CORONARY ANGIOGRAPHY N/A 02/20/2017   Procedure: LEFT HEART CATH AND CORONARY ANGIOGRAPHY;  Surgeon: Isaias Cowman, MD;  Location: [facility];  Service: Cardiovascular;  Laterality: N/A;  . LEFT HEART CATH AND CORS/GRAFTS ANGIOGRAPHY N/A 05/24/2017   Procedure: LEFT HEART CATH AND CORS/GRAFTS ANGIOGRAPHY;  Surgeon: Sherren Mocha, MD;  Location: [facility];  Service: Cardiovascular;  Laterality: N/A;  . NASAL SINUS SURGERY    . POLYPECTOMY  01/17/2016   Procedure: POLYPECTOMY;  Surgeon: [name], MD;  Location: [facility];  Service: Endoscopy;;  . RIGHT OOPHORECTOMY    . TEE WITHOUT CARDIOVERSION N/A 02/20/2017   Procedure: TRANSESOPHAGEAL ECHOCARDIOGRAM (TEE);  Surgeon: Grace Isaac, MD;  Location: [facility];  Service: Open Heart Surgery;  Laterality: N/A;    There were no vitals  filed for this visit.  Subjective Assessment - 01/18/19 1805    Subjective  Patient reports she has usual pain of between 5-6/10 in her usual low back and R glute region. She states she felt no worse than usual following last treatment session. States she felt poorly this morning but feels better now upon arrival.    Pertinent History  PMH includes atrial fibrillation, CAD, cancer ([place]), depression, fibromyalgia, HTN, HLD, CVA in 2019, history of MI with CABG in 2018. Patient has had low back pain and hip pain for years, has had therapy in the past with good results. Reported she is most limited in functional  abilities, standing and walking.    Limitations  Walking;Sitting;Standing;House hold activities;Lifting    How long can you sit comfortably?  can sit how long she wants if she often bends forward    How long can you stand comfortably?  unable to tolerated standing at all in one position    How long can you walk comfortably?  15 minutes    Diagnostic tests  Diffuse multilevel degenerative change with scoliosis concaveleft. 2 mm retrolisthesis L1 on L2 and L2 on L3. This most likelydegenerative. No acute bony abnormality.Aortoiliac and visceral atherosclerotic vascular disease. Mildectasia of the abdominal aorta can not be excluded. Aorticultrasound can be obtained to further evaluate.    Patient Stated Goals  Patient would like to decrease her pain, improve her ability to move    Currently in Pain?  Yes    Pain Score  6     Pain Location  Back    Pain Onset  More than a month ago       TREATMENT: Denies history of spinal surgery Denies sensitivity to latex Denies history of long term prednisone use  Therapeutic exercise:to centralize symptoms and improve ROM, strength, muscular endurance, and activity tolerance required for successful completion of functional activities. -NuStep level4using bilateral upper and lower extremities.Seat/handle setting 6. For improved extremity mobility, muscular endurance, and activity tolerance; and to induce the analgesic effect of aerobic exercise, stimulate improved joint nutrition, and prepare body structures and systems for following interventions. X12Min during subjective exam.Distance = 0.51 miles. Average SPM = 77. -Standing cervical thoracic extension/BUE flexion and serratus anterior activation, lat stretch, with rust colored cylindrical bolster up wall, 5 second holds, cuing for technique. 2x10 repsplus time for instruction, transitions, and appropriate rest.  - seated lat pull with isolated scapular depression/elevation. 3x10,15# and 2x  20#.  - Standing pallof press (multifidus press) with single red theraband 2x15 each side. Cuing for trunk control and breathing.  Circuit 2: - step up to 4 inch step with contralateral forward punch against red theraband with handle in it. 2x10  With CGA and gait belt for safety.  - Standing rows with scapular retraction for improved postural and shoulder girdle strengthening and mobility. Required instruction for technique and cuing to retract, posteriorly tilt, and depress scapulae. green theraband 2 x 20 each side.   HOME EXERCISE PROGRAM Access Code: [email or web address]  URL: [email or web address]  Date: 12/21/2018  Prepared by: Rosita Kea   Exercises   Neutral Curl Up with Straight Leg - 2 sets - 10 reps - 1 breath hold - 2x daily   Side plank - Wall modification - 2 sets - 5 Breaths - 2x daily   PT Education - 01/18/19 1807    Education provided  Yes    Education Details  Exercise purpose/form. Self management techniques.    Person(s) Educated  Patient  Methods  Explanation;Demonstration    Comprehension  Verbalized understanding;Returned demonstration       PT Short Term Goals - 12/05/18 1938      PT SHORT TERM GOAL #1   Title  Patient will be compliant with initial HEP in preparation for self management of condition    Baseline  continues with long term HEP from previous episodes of care.    Time  4    Period  Weeks    Status  Achieved    Target Date  12/28/18        PT Long Term Goals - 01/09/19 1823      PT LONG TERM GOAL #1   Title  Pt will decrease mODI scoreby at least 13 points in order demonstrate clinically significant reduction in back pain/disability.    Baseline  54% (11/30/2018); 49% (01/09/2019);    Time  8    Period  Weeks    Status  Partially Met    Target Date  03/06/19      PT LONG TERM GOAL #2   Title  Pt will be independent with HEP in order to improve strength and decrease back pain in order to improve pain-free function at home  and work.    Baseline  is currently participating in HEP (01/09/2019);    Time  8    Period  Weeks    Status  Partially Met    Target Date  03/06/19      PT LONG TERM GOAL #3   Title  Pt will decrease worst back pain as reported on NPRS by at least 2 points in order to demonstrate clinically significant reduction in back pain.    Baseline  worst pain 10/10 11/30/2018; saturday it made her nauseaous, rates at least 8-9/10 (01/09/2019);    Time  8    Period  Weeks    Status  Partially Met    Target Date  03/06/19      PT LONG TERM GOAL #4   Title  Pt will increase strength of by at least 1/2 MMT grade in order to demonstrate improvement in strength and function.    Baseline  see objective of eval 11/30/2018, progress note 01/09/2019;    Time  8    Period  Weeks    Status  Partially Met    Target Date  03/06/19      PT LONG TERM GOAL #5   Title  Patient will report that she is able to walk at least 65mnutes with low back pain/hip pain no greater than 5/10 to improve pts tolerance to endurance activities to increase pt's overall health as well as ability to perform functional activities.    Baseline  ~144mutes 11/30/2018, 01/09/2019;    Time  8    Period  Weeks    Status  On-going    Target Date  03/06/19            Plan - 01/18/19 1940    Clinical Impression Statement  Patient tolerated treatment well with some difficulty due to pain and imbalance. Patient is showing improved motor control and breathing patterns but continues to hold breath at times and overuse upper traps during arm activities. Able to progress load and reps on several exercises. She continues to be limited by chronic pain, low activity tolerance, decreased endurance, and strength and ROM deficits. Patient would benefit from continued physical therapy to address remaining impairments and functional limitations to work towards stated goals and return  to PLOF or maximal functional independence.    Personal Factors and  Comorbidities  Age;Time since onset of injury/illness/exacerbation;Comorbidity 3+;Profession;Past/Current Experience    Comorbidities  atrial fibrillation, CVA, MI, HTN, HLD, cancer, hypothyrodism    Examination-Activity Limitations  Bathing;Hygiene/Grooming;Squat;Stairs;Lift;Bed Mobility;Bend;Locomotion Level;Stand;Reach Overhead;Caring for Others;Carry;Transfers;Sit;Dressing;Sleep    Examination-Participation Restrictions  Church;Laundry;Shop;Cleaning;Volunteer;Community Activity;Meal Prep;Yard Work;Driving    Stability/Clinical Decision Making  Stable/Uncomplicated    Rehab Potential  Fair    Clinical Impairments Affecting Rehab Potential  (+)motivated, previous improvement with physical therapy(-)chronic condition, pain, recent CABG x 3 02/20/2017, arthritis, HTN, CVA    PT Frequency  2x / week    PT Duration  8 weeks    PT Treatment/Interventions  Cryotherapy;Electrical Stimulation;Moist Heat;Therapeutic exercise;Therapeutic activities;Neuromuscular re-education;Patient/family education;Manual techniques;Balance training;Canalith Repostioning;Traction;Dry needling;Joint Manipulations;Spinal Manipulations;Splinting;Taping;Energy conservation;Gait training;Stair training;Ultrasound;Functional mobility training;Passive range of motion;Vestibular;Aquatic Therapy;ADLs/Self Care Home Management    PT Next Visit Plan  continue hip and functional strengthening/activities as tolerated.    PT Home Exercise Plan  Medbridge Access Code: [email or web address]; Pt reports she performs seated heel/toe raises, SLR, seated marching, heel slides, demonstrated good knowledge of log rolling technique    Consulted and Agree with Plan of Care  Patient       Patient will benefit from skilled therapeutic intervention in order to improve the following deficits and impairments:  Pain, Improper body mechanics, Increased muscle spasms, Decreased activity tolerance, Decreased endurance, Decreased range of motion, Decreased strength,  Impaired perceived functional ability, Postural dysfunction, Decreased mobility, Difficulty walking, Impaired flexibility  Visit Diagnosis: Muscle weakness (generalized)  Muscle spasm of back  Difficulty in walking, not elsewhere classified  Low back pain with sciatica, sciatica laterality unspecified, unspecified back pain laterality, unspecified chronicity  Pain in right hip     Problem List Patient Active Problem List   Diagnosis Date Noted  . Chest pain 10/26/2018  . TIA (transient ischemic attack) 03/21/2018  . CVA (cerebral vascular accident) ([place]) 10/22/2017  . Abnormal nuclear stress test 05/24/2017  . 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 PAF (paroxysmal atrial fibrillation) ([place])   . Non-STEMI (non-ST elevated myocardial infarction) ([place]) 02/20/2017  . 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 Moderate mitral insufficiency 01/11/2017  . Personal history of colonic polyps   . Benign neoplasm of ascending colon   . Benign neoplasm of descending colon   . Disequilibrium 09/16/2015  . Panic attacks 09/11/2015  . Ganglion cyst 08/16/2015  . Hypernatremia 07/04/2015  . Upper back pain 07/04/2015  . Other osteoarthritis involving multiple joints 02/06/2015  . Clinical depression 09/27/2014  . Dry mouth 09/27/2014  . Fibrositis 09/27/2014  . LBP (low back pain) 09/27/2014  . Avitaminosis D 09/27/2014  . Decreased leukocytes 09/27/2014  . Abnormal blood sugar 05/03/2009  . Insomnia 09/29/2007  . HTN (hypertension) 03/09/2007  . Adaptation reaction 01/29/2007  . Acid reflux 10/07/2006  . Menopausal and postmenopausal disorder 10/05/2006  . Headache, migraine 10/05/2006  . Arthritis, degenerative 03/23/1994  . Adult hypothyroidism 04/15/1993  . Hyperlipidemia LDL goal <70 03/25/1993    Everlean Alstrom. Graylon Good, PT, DPT 01/18/19, 7:42 PM  [facility] [address] [place], [address] Phone: [phone]   Fax:  [phone]  Name: Stacey [name] MRN: [id number] Date of Birth: 09-20-1940

## 2019-01-23 ENCOUNTER — Other Ambulatory Visit: Payer: Self-pay

## 2019-01-23 ENCOUNTER — Ambulatory Visit: Payer: Medicare Other | Admitting: Physical Therapy

## 2019-01-23 ENCOUNTER — Encounter: Payer: Self-pay | Admitting: Physical Therapy

## 2019-01-23 DIAGNOSIS — M62830 Muscle spasm of back: Secondary | ICD-10-CM | POA: Diagnosis not present

## 2019-01-23 DIAGNOSIS — M5440 Lumbago with sciatica, unspecified side: Secondary | ICD-10-CM | POA: Diagnosis not present

## 2019-01-23 DIAGNOSIS — R262 Difficulty in walking, not elsewhere classified: Secondary | ICD-10-CM

## 2019-01-23 DIAGNOSIS — M6281 Muscle weakness (generalized): Secondary | ICD-10-CM

## 2019-01-23 DIAGNOSIS — M25551 Pain in right hip: Secondary | ICD-10-CM | POA: Diagnosis not present

## 2019-01-23 NOTE — Therapy (Signed)
[facility] [address] [place], [address] Phone: [phone]   Fax:  [phone]  Physical Therapy Treatment  Patient Details  Name: Stacey [name] MRN: [id number] Date of Birth: 20-Jun-1940 Referring Provider (PT): Miguel Aschoff   Encounter Date: 01/23/2019  PT End of Session - 01/23/19 1809    Visit Number  14    Number of Visits  26    Date for PT Re-Evaluation  03/06/19    Authorization Type  Medicare reporting period from 01/09/2019);    Authorization Time Period  current cert period 1/94/1740 - 03/06/2019 (last PN: 01/09/2019);    Authorization - Visit Number  4    Authorization - Number of Visits  10    PT Start Time  1804    PT Stop Time  1846    PT Time Calculation (min)  42 min    Activity Tolerance  Patient tolerated treatment well;Patient limited by pain    Behavior During Therapy  WFL for tasks assessed/performed       Past Medical History:  Diagnosis Date  . Abnormal nuclear stress test 05/24/2017  . Arthritis   . Asthma   . Atrial fibrillation, transient ([place])   . 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 Cancer ([place])   . Complication of anesthesia   . Depression   . Family history of adverse reaction to anesthesia    most of family - PONV  . Fibromyalgia   . Glaucoma   . Headache    migraines - 1-2x/mo  . HTN (hypertension) 03/09/2007  . Hyperlipidemia LDL goal <70 03/25/1993  . Hypertension   . Motion sickness    all moving vehicles  . PONV (postoperative nausea and vomiting)   . Stroke ([place]) 2019  . Thyroid disease     Past Surgical History:  Procedure Laterality Date  . ABDOMINAL HYSTERECTOMY    . APPENDECTOMY    . BLADDER SURGERY    . CATARACT EXTRACTION W/ INTRAOCULAR LENS  IMPLANT, BILATERAL    . COLONOSCOPY WITH PROPOFOL N/A 01/17/2016   Procedure: COLONOSCOPY WITH PROPOFOL;  Surgeon: [name], MD;  Location:  [facility];  Service: Endoscopy;  Laterality: N/A;  . CORONARY ARTERY BYPASS GRAFT N/A 02/20/2017   Procedure: CORONARY ARTERY BYPASS GRAFTING (CABG) x , three using left internal mammary artery to left anterior descending coronary artery and right greater saphenous vein harvested endoscopically to distal right and diagonal coronary arteries.;  Surgeon: Grace Isaac, MD;  Location: [facility];  Service: Open Heart Surgery;  Laterality: N/A;  . LEFT HEART CATH AND CORONARY ANGIOGRAPHY N/A 02/20/2017   Procedure: LEFT HEART CATH AND CORONARY ANGIOGRAPHY;  Surgeon: Isaias Cowman, MD;  Location: [facility];  Service: Cardiovascular;  Laterality: N/A;  . LEFT HEART CATH AND CORS/GRAFTS ANGIOGRAPHY N/A 05/24/2017   Procedure: LEFT HEART CATH AND CORS/GRAFTS ANGIOGRAPHY;  Surgeon: Sherren Mocha, MD;  Location: [facility];  Service: Cardiovascular;  Laterality: N/A;  . NASAL SINUS SURGERY    . POLYPECTOMY  01/17/2016   Procedure: POLYPECTOMY;  Surgeon: [name], MD;  Location: [facility];  Service: Endoscopy;;  . RIGHT OOPHORECTOMY    . TEE WITHOUT CARDIOVERSION N/A 02/20/2017   Procedure: TRANSESOPHAGEAL ECHOCARDIOGRAM (TEE);  Surgeon: Grace Isaac, MD;  Location: [facility];  Service: Open Heart Surgery;  Laterality: N/A;    There were no vitals  filed for this visit.  Subjective Assessment - 01/23/19 1806    Subjective  Patient reports she has mild increase of pain as 6-7/10 in her usual low back and R glute region due to the raining and humid weather today.    Pertinent History  PMH includes atrial fibrillation, CAD, cancer (skin), depression, fibromyalgia, HTN, HLD, CVA in 2019, history of MI with CABG in 2018. Patient has had low back pain and hip pain for years, has had therapy in the past with good results. Reported she is most limited in functional abilities, standing and walking.    Limitations  Walking;Sitting;Standing;House hold activities;Lifting     How long can you sit comfortably?  can sit how long she wants if she often bends forward    How long can you stand comfortably?  unable to tolerated standing at all in one position    How long can you walk comfortably?  15 minutes    Diagnostic tests  Diffuse multilevel degenerative change with scoliosis concaveleft. 2 mm retrolisthesis L1 on L2 and L2 on L3. This most likelydegenerative. No acute bony abnormality.Aortoiliac and visceral atherosclerotic vascular disease. Mildectasia of the abdominal aorta can not be excluded. Aorticultrasound can be obtained to further evaluate.    Patient Stated Goals  Patient would like to decrease her pain, improve her ability to move    Currently in Pain?  Yes    Pain Score  7     Pain Type  Chronic pain    Pain Onset  More than a month ago       TREATMENT: Denies history of spinal surgery Denies sensitivity to latex Denies history of long term prednisone use  Therapeutic exercise:to centralize symptoms and improve ROM, strength, muscular endurance, and activity tolerance required for successful completion of functional activities.  -NuStep level4using bilateral upper and lower extremities.Seat/handle setting 6. For improved extremity mobility, muscular endurance, and activity tolerance; and to induce the analgesic effect of aerobic exercise, stimulate improved joint nutrition, and prepare body structures and systems for following interventions. X8Min during subjective exam.Distance = 0.28mles. Average SPM = 74. -Standing cervical thoracic extension/BUE flexion and serratus anterior activation, lat stretch, withrust colored cylindrical bolsterup wall, cuing for technique. 2x10repsplus time for instruction, transitions, and no rest required by patient.  - seated lat pull with isolated scapular depression/elevation.  2x15 20#.  -Standing pallof press (multifidus press) withsinglegreen theraband 2x15 each side. Cuing for trunkcontrol  and breathing. - step up to step with contralateral forward punch against green theraband with handle in it. With CGA and gait belt for safety. Started with 4 inch step with left foot leading R hand holding green theraband x5, but stopped due to lumbar discomfort. - Seated pelvic anterior and posterior tilting x 10 on each side with blue dyna disc. Noted for pain with flexion and limited ability to flex lumbar spine. Poor control that improved with cuing.  - Seated back flexion with B UE rolling on white physio ball x 10 to stretch lumbar spine, noted for decreased lumbar flexion. PT provided anchoring of pelvis to chair to encourage flexion at lower lumbar spine with mild improvement.  - Seated segmental spinal flexion attempting to coordinate motion at each segment starting and finishing with cervical spine motion. Tactile cuing to focus on each vertebral segment. Patient demo slightly improved relaxation and lumbar spine motion. X 5 but had difficulty completing 5th rep due to pain in L upper quadrant of abdomen with flexion and return that patient states  is common for her.  - step up to 2 inch blue foam with contralateral forward punch against green theraband with handle in it. With CGA and gait belt for safety. 1X10 on each side. To improve balance, anti-rotational trunk, strength, and hip/LE strength.    HOME EXERCISE PROGRAM Access Code: [email or web address]  URL: [email or web address]  Date: 12/21/2018  Prepared by: Rosita Kea   Exercises   Neutral Curl Up with Straight Leg - 2 sets - 10 reps - 1 breath hold - 2x daily   Side plank - Wall modification - 2 sets - 5 Breaths - 2x daily   PT Education - 01/23/19 1808    Education provided  Yes    Education Details  Exercise purpose/form. Self management techniques.    Person(s) Educated  Patient    Methods  Explanation;Demonstration    Comprehension  Verbalized understanding;Returned demonstration;Verbal cues required;Tactile  cues required       PT Short Term Goals - 12/05/18 1938      PT SHORT TERM GOAL #1   Title  Patient will be compliant with initial HEP in preparation for self management of condition    Baseline  continues with long term HEP from previous episodes of care.    Time  4    Period  Weeks    Status  Achieved    Target Date  12/28/18        PT Long Term Goals - 01/09/19 1823      PT LONG TERM GOAL #1   Title  Pt will decrease mODI scoreby at least 13 points in order demonstrate clinically significant reduction in back pain/disability.    Baseline  54% (11/30/2018); 49% (01/09/2019);    Time  8    Period  Weeks    Status  Partially Met    Target Date  03/06/19      PT LONG TERM GOAL #2   Title  Pt will be independent with HEP in order to improve strength and decrease back pain in order to improve pain-free function at home and work.    Baseline  is currently participating in HEP (01/09/2019);    Time  8    Period  Weeks    Status  Partially Met    Target Date  03/06/19      PT LONG TERM GOAL #3   Title  Pt will decrease worst back pain as reported on NPRS by at least 2 points in order to demonstrate clinically significant reduction in back pain.    Baseline  worst pain 10/10 11/30/2018; saturday it made her nauseaous, rates at least 8-9/10 (01/09/2019);    Time  8    Period  Weeks    Status  Partially Met    Target Date  03/06/19      PT LONG TERM GOAL #4   Title  Pt will increase strength of by at least 1/2 MMT grade in order to demonstrate improvement in strength and function.    Baseline  see objective of eval 11/30/2018, progress note 01/09/2019;    Time  8    Period  Weeks    Status  Partially Met    Target Date  03/06/19      PT LONG TERM GOAL #5   Title  Patient will report that she is able to walk at least 30mnutes with low back pain/hip pain no greater than 5/10 to improve pts tolerance to endurance activities to increase pt's overall health as well as  ability to perform  functional activities.    Baseline  ~37mnutes 11/30/2018, 01/09/2019;    Time  8    Period  Weeks    Status  On-going    Target Date  03/06/19            Plan - 01/23/19 1842    Clinical Impression Statement  Patient tolerated treatment fair with difficulty due to pain. She was able to progress resistance for lat pull down and demo improved coordination of scapular depressors during this exercise. Step up was more painful than usual today, so it was modified to be more comfortable. Patient demo minimal lumbar spine flexion and exaggerated thoracic flexion with spinal flexion exercise. No better or worse following session. She continues to be limited by chronic pain, low activity tolerance, decreased endurance, and strength and ROM deficits. Patient would benefit from continued physical therapy to address remaining impairments and functional limitations to work towards stated goals and return to PLOF or maximal functional independence.    Personal Factors and Comorbidities  Age;Time since onset of injury/illness/exacerbation;Comorbidity 3+;Profession;Past/Current Experience    Comorbidities  atrial fibrillation, CVA, MI, HTN, HLD, cancer, hypothyrodism    Examination-Activity Limitations  Bathing;Hygiene/Grooming;Squat;Stairs;Lift;Bed Mobility;Bend;Locomotion Level;Stand;Reach Overhead;Caring for Others;Carry;Transfers;Sit;Dressing;Sleep    Examination-Participation Restrictions  Church;Laundry;Shop;Cleaning;Volunteer;Community Activity;Meal Prep;Yard Work;Driving    Stability/Clinical Decision Making  Stable/Uncomplicated    Rehab Potential  Fair    Clinical Impairments Affecting Rehab Potential  (+)motivated, previous improvement with physical therapy(-)chronic condition, pain, recent CABG x 3 02/20/2017, arthritis, HTN, CVA    PT Frequency  2x / week    PT Duration  8 weeks    PT Treatment/Interventions  Cryotherapy;Electrical Stimulation;Moist Heat;Therapeutic exercise;Therapeutic  activities;Neuromuscular re-education;Patient/family education;Manual techniques;Balance training;Canalith Repostioning;Traction;Dry needling;Joint Manipulations;Spinal Manipulations;Splinting;Taping;Energy conservation;Gait training;Stair training;Ultrasound;Functional mobility training;Passive range of motion;Vestibular;Aquatic Therapy;ADLs/Self Care Home Management    PT Next Visit Plan  continue hip and functional strengthening/activities as tolerated.    PT Home Exercise Plan  Medbridge Access Code: [email or web address]; Pt reports she performs seated heel/toe raises, SLR, seated marching, heel slides, demonstrated good knowledge of log rolling technique    Consulted and Agree with Plan of Care  Patient       Patient will benefit from skilled therapeutic intervention in order to improve the following deficits and impairments:  Pain, Improper body mechanics, Increased muscle spasms, Decreased activity tolerance, Decreased endurance, Decreased range of motion, Decreased strength, Impaired perceived functional ability, Postural dysfunction, Decreased mobility, Difficulty walking, Impaired flexibility  Visit Diagnosis: Muscle weakness (generalized)  Muscle spasm of back  Difficulty in walking, not elsewhere classified  Low back pain with sciatica, sciatica laterality unspecified, unspecified back pain laterality, unspecified chronicity  Pain in right hip     Problem List Patient Active Problem List   Diagnosis Date Noted  . Chest pain 10/26/2018  . TIA (transient ischemic attack) 03/21/2018  . CVA (cerebral vascular accident) ([place] 10/22/2017  . Abnormal nuclear stress test 05/24/2017  . 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 PAF (paroxysmal atrial fibrillation) ([facility]   . Non-STEMI (non-ST elevated myocardial infarction) ([place] 02/20/2017  . 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 Moderate mitral insufficiency 01/11/2017  . Personal history of colonic polyps    . Benign neoplasm of ascending colon   . Benign neoplasm of descending colon   . Disequilibrium 09/16/2015  . Panic attacks 09/11/2015  . Ganglion cyst 08/16/2015  . Hypernatremia 07/04/2015  . Upper back pain 07/04/2015  . Other osteoarthritis involving multiple joints  02/06/2015  . Clinical depression 09/27/2014  . Dry mouth 09/27/2014  . Fibrositis 09/27/2014  . LBP (low back pain) 09/27/2014  . Avitaminosis D 09/27/2014  . Decreased leukocytes 09/27/2014  . Abnormal blood sugar 05/03/2009  . Insomnia 09/29/2007  . HTN (hypertension) 03/09/2007  . Adaptation reaction 01/29/2007  . Acid reflux 10/07/2006  . Menopausal and postmenopausal disorder 10/05/2006  . Headache, migraine 10/05/2006  . Arthritis, degenerative 03/23/1994  . Adult hypothyroidism 04/15/1993  . Hyperlipidemia LDL goal <70 03/25/1993    Sherrilyn Rist, SPT completed some documentation.  Student physical therapist under direct supervision of licensed physical therapists during the entirety of the session.   Everlean Alstrom. Graylon Good, PT, DPT 01/23/19, 8:17 PM  [facility] [address], [place], [address] Phone: [phone]   Fax:  [phone]  Name: LYLIANNA FRAISER MRN: [id number] Date of Birth: Jul 13, 1940

## 2019-01-25 ENCOUNTER — Other Ambulatory Visit: Payer: Self-pay

## 2019-01-25 ENCOUNTER — Encounter: Payer: Self-pay | Admitting: Physical Therapy

## 2019-01-25 ENCOUNTER — Ambulatory Visit: Payer: Medicare Other | Attending: Family Medicine | Admitting: Physical Therapy

## 2019-01-25 DIAGNOSIS — M62830 Muscle spasm of back: Secondary | ICD-10-CM | POA: Insufficient documentation

## 2019-01-25 DIAGNOSIS — M5440 Lumbago with sciatica, unspecified side: Secondary | ICD-10-CM | POA: Diagnosis not present

## 2019-01-25 DIAGNOSIS — M6281 Muscle weakness (generalized): Secondary | ICD-10-CM | POA: Diagnosis not present

## 2019-01-25 DIAGNOSIS — R262 Difficulty in walking, not elsewhere classified: Secondary | ICD-10-CM | POA: Diagnosis not present

## 2019-01-25 DIAGNOSIS — M25551 Pain in right hip: Secondary | ICD-10-CM | POA: Insufficient documentation

## 2019-01-25 NOTE — Therapy (Signed)
[facility] [address], [place], [address] Phone: [phone]   Fax:  [phone]  Physical Therapy Treatment  Patient Details  Name: Stacey [name] MRN: [id number] Date of Birth: 06-17-1940 Referring Provider (PT): Miguel Aschoff   Encounter Date: 01/25/2019  PT End of Session - 01/25/19 1814    Visit Number  15    Number of Visits  26    Date for PT Re-Evaluation  03/06/19    Authorization Type  Medicare reporting period from 01/09/2019);    Authorization Time Period  current cert period 0/56/9794 - 03/06/2019 (last PN: 01/09/2019);    Authorization - Visit Number  5    Authorization - Number of Visits  10    PT Start Time  1806    PT Stop Time  1846    PT Time Calculation (min)  40 min    Activity Tolerance  Patient tolerated treatment well;Patient limited by pain    Behavior During Therapy  [facility] for tasks assessed/performed       Past Medical History:  Diagnosis Date  . Abnormal nuclear stress test 05/24/2017  . Arthritis   . Asthma   . Atrial fibrillation, transient ([place])   . 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 Cancer ([place])   . Complication of anesthesia   . Depression   . Family history of adverse reaction to anesthesia    most of family - PONV  . Fibromyalgia   . Glaucoma   . Headache    migraines - 1-2x/mo  . HTN (hypertension) 03/09/2007  . Hyperlipidemia LDL goal <70 03/25/1993  . Hypertension   . Motion sickness    all moving vehicles  . PONV (postoperative nausea and vomiting)   . Stroke ([place]) 2019  . Thyroid disease     Past Surgical History:  Procedure Laterality Date  . ABDOMINAL HYSTERECTOMY    . APPENDECTOMY    . BLADDER SURGERY    . CATARACT EXTRACTION W/ INTRAOCULAR LENS  IMPLANT, BILATERAL    . COLONOSCOPY WITH PROPOFOL N/A 01/17/2016   Procedure: COLONOSCOPY WITH PROPOFOL;  Surgeon: [name], MD;  Location:  [place];  Service: Endoscopy;  Laterality: N/A;  . CORONARY ARTERY BYPASS GRAFT N/A 02/20/2017   Procedure: CORONARY ARTERY BYPASS GRAFTING (CABG) x , three using left internal mammary artery to left anterior descending coronary artery and right greater saphenous vein harvested endoscopically to distal right and diagonal coronary arteries.;  Surgeon: Grace Isaac, MD;  Location: [place];  Service: Open Heart Surgery;  Laterality: N/A;  . LEFT HEART CATH AND CORONARY ANGIOGRAPHY N/A 02/20/2017   Procedure: LEFT HEART CATH AND CORONARY ANGIOGRAPHY;  Surgeon: Isaias Cowman, MD;  Location: [place] [facility];  Service: Cardiovascular;  Laterality: N/A;  . LEFT HEART CATH AND CORS/GRAFTS ANGIOGRAPHY N/A 05/24/2017   Procedure: LEFT HEART CATH AND CORS/GRAFTS ANGIOGRAPHY;  Surgeon: Sherren Mocha, MD;  Location: [place] [facility];  Service: Cardiovascular;  Laterality: N/A;  . NASAL SINUS SURGERY    . POLYPECTOMY  01/17/2016   Procedure: POLYPECTOMY;  Surgeon: [name], MD;  Location: [facility];  Service: Endoscopy;;  . RIGHT OOPHORECTOMY    . TEE WITHOUT CARDIOVERSION N/A 02/20/2017   Procedure: TRANSESOPHAGEAL ECHOCARDIOGRAM (TEE);  Surgeon: Grace Isaac, MD;  Location: [place] [facility];  Service: Open Heart Surgery;  Laterality: N/A;    There were no vitals  filed for this visit.  Subjective Assessment - 01/25/19 1812    Subjective  Patient reports she has pain as 6/10 in her usual low back and R glute region. 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    Pertinent History  PMH includes atrial fibrillation, CAD, cancer (skin), depression, fibromyalgia, HTN, HLD, CVA in 2019, history of MI with CABG in 2018. Patient has had low back pain and hip pain for years, has had therapy in the past with good results. Reported she is most limited in functional abilities, standing and walking.     Limitations  Walking;Sitting;Standing;House hold activities;Lifting    How long can you sit comfortably?  can sit how long she wants if she often bends forward    How long can you stand comfortably?  unable to tolerated standing at all in one position    How long can you walk comfortably?  15 minutes    Diagnostic tests  Diffuse multilevel degenerative change with scoliosis concaveleft. 2 mm retrolisthesis L1 on L2 and L2 on L3. This most likelydegenerative. No acute bony abnormality.Aortoiliac and visceral atherosclerotic vascular disease. Mildectasia of the abdominal aorta can not be excluded. Aorticultrasound can be obtained to further evaluate.    Patient Stated Goals  Patient would like to decrease her pain, improve her ability to move    Currently in Pain?  Yes    Pain Score  6     Pain Location  Back    Pain Type  Chronic pain    Pain Onset  More than a month ago       TREATMENT: Denies history of spinal surgery Denies sensitivity to latex Denies history of long term prednisone use  Therapeutic exercise:to centralize symptoms and improve ROM, strength, muscular endurance, and activity tolerance required for successful completion of functional activities.  -NuStep level4using bilateral upper and lower extremities.Seat/handle setting 6. For improved extremity mobility, muscular endurance, and activity tolerance; and to induce the analgesic effect of aerobic exercise, stimulate improved joint nutrition, and prepare body structures and systems for following interventions. X8Min during subjective exam.Distance = 0.41mles. Average SPM =74. - seated lat pull with isolated scapular depression/elevation.1x10 20#; 1x10 25#; 1x10 30# -Standing cervical thoracic extension/BUE flexion and serratus anterior activation, lat stretch, withrust colored cylindrical bolsterup wall, cuing for technique. 30repsplus time for instruction, transitions, and no rest required by patient.   - Resisted walking x 4 directions (forward/backword/2sideways) with green theraband cross the waist. 5x each side. To increase LE strength and balance to reduce fall risk.   Manual therapy: to reduce pain and tissue tension, improve range of motion, neuromodulation, in order to promote improved ability to complete functional activities.  - STM at thoracis, scapula, and lumbar paraspinal region with roller/hand 8 minutes.   HOME EXERCISE PROGRAM Access Code: [email or web address]  URL: [email or web address]  Date: 12/21/2018  Prepared by: SRosita Kea  Exercises   Neutral Curl Up with Straight Leg - 2 sets - 10 reps - 1 breath hold - 2x daily   Side plank - Wall modification - 2 sets - 5 Breaths - 2x daily   PT Education - 01/25/19 1814    Education provided  Yes    Education Details  Exercise purpose/form. Self management techniques.    Person(s) Educated  Patient    Methods  Explanation;Demonstration;Tactile cues;Verbal cues    Comprehension  Verbalized understanding;Returned demonstration;Verbal cues required;Tactile cues required  PT Short Term Goals - 12/05/18 1938      PT SHORT TERM GOAL #1   Title  Patient will be compliant with initial HEP in preparation for self management of condition    Baseline  continues with long term HEP from previous episodes of care.    Time  4    Period  Weeks    Status  Achieved    Target Date  12/28/18        PT Long Term Goals - 01/09/19 1823      PT LONG TERM GOAL #1   Title  Pt will decrease mODI scoreby at least 13 points in order demonstrate clinically significant reduction in back pain/disability.    Baseline  54% (11/30/2018); 49% (01/09/2019);    Time  8    Period  Weeks    Status  Partially Met    Target Date  03/06/19      PT LONG TERM GOAL #2   Title  Pt will be independent with HEP in order to improve strength and decrease back pain in order to improve pain-free function at home and work.    Baseline  is  currently participating in HEP (01/09/2019);    Time  8    Period  Weeks    Status  Partially Met    Target Date  03/06/19      PT LONG TERM GOAL #3   Title  Pt will decrease worst back pain as reported on NPRS by at least 2 points in order to demonstrate clinically significant reduction in back pain.    Baseline  worst pain 10/10 11/30/2018; saturday it made her nauseaous, rates at least 8-9/10 (01/09/2019);    Time  8    Period  Weeks    Status  Partially Met    Target Date  03/06/19      PT LONG TERM GOAL #4   Title  Pt will increase strength of by at least 1/2 MMT grade in order to demonstrate improvement in strength and function.    Baseline  see objective of eval 11/30/2018, progress note 01/09/2019;    Time  8    Period  Weeks    Status  Partially Met    Target Date  03/06/19      PT LONG TERM GOAL #5   Title  Patient will report that she is able to walk at least 76mnutes with low back pain/hip pain no greater than 5/10 to improve pts tolerance to endurance activities to increase pt's overall health as well as ability to perform functional activities.    Baseline  ~137mutes 11/30/2018, 01/09/2019;    Time  8    Period  Weeks    Status  On-going    Target Date  03/06/19            Plan - 01/25/19 1922    Clinical Impression Statement  Patient tolerated treatment well with report of decreased tightness by end of session was able to progress to greater resistance during lat pull down,and demonstrated improved motor control with less cuing. Patient tolerated resisted ambulation well and demo some difficulty due to pain, LE weakness and balance deficits. She continues to be limited by chronic pain, low activity tolerance, decreased endurance, and strength and ROM deficits. Patient would benefit from continued physical therapy to address remaining impairments and functional limitations to work towards stated goals and return to PLOF or maximal functional independence.    Personal  Factors and  Comorbidities  Age;Time since onset of injury/illness/exacerbation;Comorbidity 3+;Profession;Past/Current Experience    Comorbidities  atrial fibrillation, CVA, MI, HTN, HLD, cancer, hypothyrodism    Examination-Activity Limitations  Bathing;Hygiene/Grooming;Squat;Stairs;Lift;Bed Mobility;Bend;Locomotion Level;Stand;Reach Overhead;Caring for Others;Carry;Transfers;Sit;Dressing;Sleep    Examination-Participation Restrictions  Church;Laundry;Shop;Cleaning;Volunteer;Community Activity;Meal Prep;Yard Work;Driving    Stability/Clinical Decision Making  Stable/Uncomplicated    Rehab Potential  Fair    Clinical Impairments Affecting Rehab Potential  (+)motivated, previous improvement with physical therapy(-)chronic condition, pain, recent CABG x 3 02/20/2017, arthritis, HTN, CVA    PT Frequency  2x / week    PT Duration  8 weeks    PT Treatment/Interventions  Cryotherapy;Electrical Stimulation;Moist Heat;Therapeutic exercise;Therapeutic activities;Neuromuscular re-education;Patient/family education;Manual techniques;Balance training;Canalith Repostioning;Traction;Dry needling;Joint Manipulations;Spinal Manipulations;Splinting;Taping;Energy conservation;Gait training;Stair training;Ultrasound;Functional mobility training;Passive range of motion;Vestibular;Aquatic Therapy;ADLs/Self Care Home Management    PT Next Visit Plan  continue hip and functional strengthening/activities as tolerated.    PT Home Exercise Plan  Medbridge Access Code: [email or web address]; Pt reports she performs seated heel/toe raises, SLR, seated marching, heel slides, demonstrated good knowledge of log rolling technique    Consulted and Agree with Plan of Care  Patient       Patient will benefit from skilled therapeutic intervention in order to improve the following deficits and impairments:  Pain, Improper body mechanics, Increased muscle spasms, Decreased activity tolerance, Decreased endurance, Decreased range of motion, Decreased  strength, Impaired perceived functional ability, Postural dysfunction, Decreased mobility, Difficulty walking, Impaired flexibility  Visit Diagnosis: Muscle weakness (generalized)  Muscle spasm of back  Difficulty in walking, not elsewhere classified  Low back pain with sciatica, sciatica laterality unspecified, unspecified back pain laterality, unspecified chronicity  Pain in right hip     Problem List Patient Active Problem List   Diagnosis Date Noted  . Chest pain 10/26/2018  . TIA (transient ischemic attack) 03/21/2018  . CVA (cerebral vascular accident) ([place]) 10/22/2017  . Abnormal nuclear stress test 05/24/2017  . 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 PAF (paroxysmal atrial fibrillation) ([place])   . Non-STEMI (non-ST elevated myocardial infarction) ([place]) 02/20/2017  . 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 Moderate mitral insufficiency 01/11/2017  . Personal history of colonic polyps   . Benign neoplasm of ascending colon   . Benign neoplasm of descending colon   . Disequilibrium 09/16/2015  . Panic attacks 09/11/2015  . Ganglion cyst 08/16/2015  . Hypernatremia 07/04/2015  . Upper back pain 07/04/2015  . Other osteoarthritis involving multiple joints 02/06/2015  . Clinical depression 09/27/2014  . Dry mouth 09/27/2014  . Fibrositis 09/27/2014  . LBP (low back pain) 09/27/2014  . Avitaminosis D 09/27/2014  . Decreased leukocytes 09/27/2014  . Abnormal blood sugar 05/03/2009  . Insomnia 09/29/2007  . HTN (hypertension) 03/09/2007  . Adaptation reaction 01/29/2007  . Acid reflux 10/07/2006  . Menopausal and postmenopausal disorder 10/05/2006  . Headache, migraine 10/05/2006  . Arthritis, degenerative 03/23/1994  . Adult hypothyroidism 04/15/1993  . Hyperlipidemia LDL goal <70 03/25/1993    Student physical therapist under direct supervision of licensed physical therapists during the entirety of the session.   Everlean Alstrom.  Graylon Good, PT, DPT 01/25/19, 8:22 PM  [facility] [address] [place], [address] Phone: [phone]   Fax:  [phone]  Name: CINDEE MCLESTER MRN: [id number] Date of Birth: 1940/08/30

## 2019-01-25 NOTE — Therapy (Deleted)
[facility] [address], [place], [address] Phone: [phone]   Fax:  [phone]  Physical Therapy Treatment  Patient Details  Name: Stacey [name] MRN: [id number] Date of Birth: 19-Jun-1940 Referring Provider (PT): Miguel Aschoff   Encounter Date: 01/25/2019    Past Medical History:  Diagnosis Date  . Abnormal nuclear stress test 05/24/2017  . Arthritis   . Asthma   . Atrial fibrillation, transient ([place])   . 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 Cancer ([place])   . Complication of anesthesia   . Depression   . Family history of adverse reaction to anesthesia    most of family - PONV  . Fibromyalgia   . Glaucoma   . Headache    migraines - 1-2x/mo  . HTN (hypertension) 03/09/2007  . Hyperlipidemia LDL goal <70 03/25/1993  . Hypertension   . Motion sickness    all moving vehicles  . PONV (postoperative nausea and vomiting)   . Stroke ([place]) 2019  . Thyroid disease     Past Surgical History:  Procedure Laterality Date  . ABDOMINAL HYSTERECTOMY    . APPENDECTOMY    . BLADDER SURGERY    . CATARACT EXTRACTION W/ INTRAOCULAR LENS  IMPLANT, BILATERAL    . COLONOSCOPY WITH PROPOFOL N/A 01/17/2016   Procedure: COLONOSCOPY WITH PROPOFOL;  Surgeon: [name], MD;  Location: [facility];  Service: Endoscopy;  Laterality: N/A;  . CORONARY ARTERY BYPASS GRAFT N/A 02/20/2017   Procedure: CORONARY ARTERY BYPASS GRAFTING (CABG) x , three using left internal mammary artery to left anterior descending coronary artery and right greater saphenous vein harvested endoscopically to distal right and diagonal coronary arteries.;  Surgeon: Grace Isaac, MD;  Location: [facility];  Service: Open Heart Surgery;  Laterality: N/A;  . LEFT HEART CATH AND CORONARY ANGIOGRAPHY N/A 02/20/2017   Procedure: LEFT HEART CATH AND CORONARY ANGIOGRAPHY;  Surgeon: Isaias Cowman, MD;  Location: [facility];  Service: Cardiovascular;  Laterality: N/A;  . LEFT HEART CATH AND CORS/GRAFTS ANGIOGRAPHY N/A 05/24/2017   Procedure: LEFT HEART CATH AND CORS/GRAFTS ANGIOGRAPHY;  Surgeon: Sherren Mocha, MD;  Location: [facility];  Service: Cardiovascular;  Laterality: N/A;  . NASAL SINUS SURGERY    . POLYPECTOMY  01/17/2016   Procedure: POLYPECTOMY;  Surgeon: [name], MD;  Location: [facility];  Service: Endoscopy;;  . RIGHT OOPHORECTOMY    . TEE WITHOUT CARDIOVERSION N/A 02/20/2017   Procedure: TRANSESOPHAGEAL ECHOCARDIOGRAM (TEE);  Surgeon: Grace Isaac, MD;  Location: [facility];  Service: Open Heart Surgery;  Laterality: N/A;    There were no vitals filed for this visit.                              PT Short Term Goals - 12/05/18 1938      PT SHORT TERM GOAL #1   Title  Patient will be compliant with initial HEP in preparation for self management of condition    Baseline  continues with long term HEP from previous episodes of care.    Time  4    Period  Weeks    Status  Achieved    Target Date  12/28/18        PT Long Term Goals - 01/09/19 1823      PT LONG TERM GOAL #  1   Title  Pt will decrease mODI scoreby at least 13 points in order demonstrate clinically significant reduction in back pain/disability.    Baseline  54% (11/30/2018); 49% (01/09/2019);    Time  8    Period  Weeks    Status  Partially Met    Target Date  03/06/19      PT LONG TERM GOAL #2   Title  Pt will be independent with HEP in order to improve strength and decrease back pain in order to improve pain-free function at home and work.    Baseline  is currently participating in HEP (01/09/2019);    Time  8    Period  Weeks    Status  Partially Met    Target Date  03/06/19      PT LONG TERM GOAL #3   Title  Pt will decrease worst back pain as reported on NPRS by at least 2 points in order to demonstrate clinically  significant reduction in back pain.    Baseline  worst pain 10/10 11/30/2018; saturday it made her nauseaous, rates at least 8-9/10 (01/09/2019);    Time  8    Period  Weeks    Status  Partially Met    Target Date  03/06/19      PT LONG TERM GOAL #4   Title  Pt will increase strength of by at least 1/2 MMT grade in order to demonstrate improvement in strength and function.    Baseline  see objective of eval 11/30/2018, progress note 01/09/2019;    Time  8    Period  Weeks    Status  Partially Met    Target Date  03/06/19      PT LONG TERM GOAL #5   Title  Patient will report that she is able to walk at least 8mnutes with low back pain/hip pain no greater than 5/10 to improve pts tolerance to endurance activities to increase pt's overall health as well as ability to perform functional activities.    Baseline  ~12mutes 11/30/2018, 01/09/2019;    Time  8    Period  Weeks    Status  On-going    Target Date  03/06/19              Patient will benefit from skilled therapeutic intervention in order to improve the following deficits and impairments:     Visit Diagnosis: No diagnosis found.     Problem List Patient Active Problem List   Diagnosis Date Noted  . Chest pain 10/26/2018  . TIA (transient ischemic attack) 03/21/2018  . CVA (cere[place]) ([place]  . Abnormal nuclear stress test 05/24/2017  . 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 PAF (paroxysmal atrial fibrillation) ([place]  . Non-STEMI (non-ST elevated myocardial infarction) ([facility]09/29/2018  . 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 Moderate mitral insufficiency 01/11/2017  . Personal history of colonic polyps   . Benign neoplasm of ascending colon   . Benign neoplasm of descending colon   . Disequilibrium 09/16/2015  . Panic attacks 09/11/2015  . Ganglion cyst 08/16/2015  . Hypernatremia 07/04/2015  . Upper back pain 07/04/2015  . Other osteoarthritis  involving multiple joints 02/06/2015  . Clinical depression 09/27/2014  . Dry mouth 09/27/2014  . Fibrositis 09/27/2014  . LBP (low back pain) 09/27/2014  . Avitaminosis D 09/27/2014  . Decreased leukocytes 09/27/2014  . Abnormal blood sugar 05/03/2009  . Insomnia  09/29/2007  . HTN (hypertension) 03/09/2007  . Adaptation reaction 01/29/2007  . Acid reflux 10/07/2006  . Menopausal and postmenopausal disorder 10/05/2006  . Headache, migraine 10/05/2006  . Arthritis, degenerative 03/23/1994  . Adult hypothyroidism 04/15/1993  . Hyperlipidemia LDL goal <70 03/25/1993    Nancy Nordmann 01/25/2019, 6:05 PM  [facility] [address] [place], [address] Phone: [phone]   Fax:  [phone]  Name: FAVOUR ALESHIRE MRN: [id number] Date of Birth: Sep 10, 1940

## 2019-02-01 ENCOUNTER — Ambulatory Visit: Payer: Medicare Other | Admitting: Physical Therapy

## 2019-02-02 ENCOUNTER — Other Ambulatory Visit: Payer: Self-pay

## 2019-02-02 DIAGNOSIS — Z8616 Personal history of COVID-19: Secondary | ICD-10-CM

## 2019-02-02 DIAGNOSIS — Z20822 Contact with and (suspected) exposure to covid-19: Secondary | ICD-10-CM

## 2019-02-02 DIAGNOSIS — R6889 Other general symptoms and signs: Secondary | ICD-10-CM | POA: Diagnosis not present

## 2019-02-02 HISTORY — DX: Personal history of COVID-19: Z86.16

## 2019-02-03 LAB — NOVEL CORONAVIRUS, NAA: SARS-CoV-2, NAA: DETECTED — AB

## 2019-02-06 ENCOUNTER — Ambulatory Visit: Payer: Medicare Other | Admitting: Physical Therapy

## 2019-02-08 ENCOUNTER — Ambulatory Visit: Payer: Medicare Other | Admitting: Physical Therapy

## 2019-02-11 ENCOUNTER — Other Ambulatory Visit: Payer: Self-pay | Admitting: Family Medicine

## 2019-02-13 ENCOUNTER — Other Ambulatory Visit: Payer: Self-pay

## 2019-02-13 ENCOUNTER — Telehealth: Payer: Self-pay | Admitting: Family Medicine

## 2019-02-13 ENCOUNTER — Ambulatory Visit: Payer: Medicare Other | Admitting: Physical Therapy

## 2019-02-13 DIAGNOSIS — R059 Cough, unspecified: Secondary | ICD-10-CM

## 2019-02-13 DIAGNOSIS — U071 COVID-19: Secondary | ICD-10-CM

## 2019-02-13 DIAGNOSIS — R05 Cough: Secondary | ICD-10-CM

## 2019-02-13 DIAGNOSIS — J069 Acute upper respiratory infection, unspecified: Secondary | ICD-10-CM

## 2019-02-13 MED ORDER — PREDNISONE 10 MG PO TABS: ORAL_TABLET | ORAL | 0 refills | Status: DC

## 2019-02-13 NOTE — Telephone Encounter (Signed)
Pt's husband called saying they use [facility]

## 2019-02-16 ENCOUNTER — Ambulatory Visit: Payer: Medicare Other | Admitting: Physical Therapy

## 2019-02-20 ENCOUNTER — Encounter: Payer: Medicare Other | Admitting: Physical Therapy

## 2019-02-20 NOTE — Progress Notes (Signed)
Subjective:   Stacey [name] is a 78 y.o. female who presents for Medicare Annual (Subsequent) preventive examination.  Review of Systems:  N/A  Cardiac Risk Factors include: advanced age (>12men, >76 women);dyslipidemia;hypertension;obesity (BMI >30kg/m2)     Objective:     Vitals: BP 132/72 (BP Location: Right Arm)   Pulse 85   Temp 98.7 F (37.1 C) (Oral)   Ht 5\' 2"  (1.575 m)   Wt 167 lb (75.8 kg)   BMI 30.54 kg/m   Body mass index is 30.54 kg/m.  Advanced Directives 02/21/2019 11/30/2018 10/26/2018 10/25/2018 03/21/2018 12/20/2017 10/22/2017  Does Patient Have a Medical Advance Directive? No Yes No No No Yes No  Type of Advance Directive - [place];Living will - - - [place];Living will -  Does patient want to make changes to medical advance directive? No - Patient declined No - Patient declined - - - - -  Copy of [place] in Chart? - No - copy requested - - - No - copy requested -  Would patient like information on creating a medical advance directive? - - No - Patient declined No - Patient declined No - Patient declined - -    Tobacco Social History   Tobacco Use  Smoking Status Former Smoker  . Quit date: 05/26/1975  . Years since quitting: 43.7  Smokeless Tobacco Never Used  Tobacco Comment   quit 45  years ago      Counseling given: Not Answered Comment: quit 45  years ago    Clinical Intake:  Pre-visit preparation completed: Yes  Pain : 0-10 Pain Score: 5  Pain Type: Chronic pain Pain Location: Back Pain Descriptors / Indicators: Spasm Pain Frequency: Constant     Nutritional Status: BMI > 30  Obese Nutritional Risks: Nausea/ vomitting/ diarrhea(Nausea due to prednisone.) Diabetes: No  How often do you need to have someone help you when you read instructions, pamphlets, or other written materials from your doctor or pharmacy?: 1 - Never  Interpreter Needed?: No  Information entered by  :: [facility], LPN  Past Medical History:  Diagnosis Date  . Abnormal nuclear stress test 05/24/2017  . Arthritis   . Asthma   . Atrial fibrillation, transient ([place])   . 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 Cancer ([place])   . Complication of anesthesia   . COVID-19   . Depression   . Family history of adverse reaction to anesthesia    most of family - PONV  . Fibromyalgia   . Glaucoma   . Headache    migraines - 1-2x/mo  . HTN (hypertension) 03/09/2007  . Hyperlipidemia LDL goal <70 03/25/1993  . Hypertension   . Motion sickness    all moving vehicles  . PONV (postoperative nausea and vomiting)   . Stroke ([place]) 2019  . Thyroid disease    Past Surgical History:  Procedure Laterality Date  . ABDOMINAL HYSTERECTOMY    . APPENDECTOMY    . BLADDER SURGERY    . CATARACT EXTRACTION W/ INTRAOCULAR LENS  IMPLANT, BILATERAL    . COLONOSCOPY WITH PROPOFOL N/A 01/17/2016   Procedure: COLONOSCOPY WITH PROPOFOL;  Surgeon: [name], MD;  Location: [facility];  Service: Endoscopy;  Laterality: N/A;  . CORONARY ARTERY BYPASS GRAFT N/A 02/20/2017   Procedure: CORONARY ARTERY BYPASS GRAFTING (CABG) x , three using left internal mammary artery to left anterior descending coronary  artery and right greater saphenous vein harvested endoscopically to distal right and diagonal coronary arteries.;  Surgeon: Grace Isaac, MD;  Location: [facility];  Service: Open Heart Surgery;  Laterality: N/A;  . LEFT HEART CATH AND CORONARY ANGIOGRAPHY N/A 02/20/2017   Procedure: LEFT HEART CATH AND CORONARY ANGIOGRAPHY;  Surgeon: Isaias Cowman, MD;  Location: [facility];  Service: Cardiovascular;  Laterality: N/A;  . LEFT HEART CATH AND CORS/GRAFTS ANGIOGRAPHY N/A 05/24/2017   Procedure: LEFT HEART CATH AND CORS/GRAFTS ANGIOGRAPHY;  Surgeon: Sherren Mocha, MD;  Location: [facility];  Service: Cardiovascular;  Laterality: N/A;  . NASAL  SINUS SURGERY    . POLYPECTOMY  01/17/2016   Procedure: POLYPECTOMY;  Surgeon: [name], MD;  Location: [facility];  Service: Endoscopy;;  . RIGHT OOPHORECTOMY    . TEE WITHOUT CARDIOVERSION N/A 02/20/2017   Procedure: TRANSESOPHAGEAL ECHOCARDIOGRAM (TEE);  Surgeon: Grace Isaac, MD;  Location: [facility];  Service: Open Heart Surgery;  Laterality: N/A;   Family History  Problem Relation Age of Onset  . Alzheimer's disease Mother   . Kidney cancer Mother   . Heart attack Father   . Pancreatic cancer Sister   . Heart attack Brother 36  . Ovarian cancer Sister   . Kidney disease Sister        dialysis  . Cancer Sister        uterin  . Diabetes Sister   . Heart attack Sister   . Heart attack Brother 59  . Ovarian cancer Sister    Social History   Socioeconomic History  . Marital status: Married    Spouse name: [name]  . Number of children: 3  . Years of education: Not on file  . Highest education level: GED or equivalent  Occupational History    Employer: [employer]  . Financial resource strain: Not hard at all  . Food insecurity    Worry: Never true    Inability: Never true  . Transportation needs    Medical: No    Non-medical: No  Tobacco Use  . Smoking status: Former Smoker    Quit date: 05/26/1975    Years since quitting: 43.7  . Smokeless tobacco: Never Used  . Tobacco comment: quit 45  years ago   Substance and Sexual Activity  . Alcohol use: No  . Drug use: No  . Sexual activity: Not Currently  Lifestyle  . Physical activity    Days per week: 0 days    Minutes per session: 0 min  . Stress: Rather much  Relationships  . Social Herbalist on phone: Patient refused    Gets together: Patient refused    Attends religious service: Patient refused    Active member of club or organization: Patient refused    Attends meetings of clubs or organizations: Patient refused    Relationship status: Patient refused  Other  Topics Concern  . Not on file  Social History Narrative  . Not on file    Outpatient Encounter Medications as of 02/21/2019  Medication Sig  . acetaminophen (TYLENOL) 325 MG tablet Take 2 tablets (650 mg total) by mouth every 6 (six) hours as needed for mild pain.  Marland Kitchen albuterol (VENTOLIN HFA) 108 (90 Base) MCG/ACT inhaler INHALE 2 PUFFS BY MOUTH EVERY 4 TO 6 HOURS AS NEEDED FOR SHORTNESS OF BREATH  . amLODipine (NORVASC) 5 MG tablet Take 1 tablet (5 mg total) daily by mouth.  Marland Kitchen  apixaban (ELIQUIS) 5 MG TABS tablet Take 1 tablet (5 mg total) by mouth 2 (two) times daily.  Marland Kitchen ascorbic acid (VITAMIN C) 500 MG tablet Take 500 mg by mouth daily.  Marland Kitchen aspirin 81 MG tablet Take 1 tablet (81 mg total) by mouth daily.  Marland Kitchen buPROPion (WELLBUTRIN XL) 300 MG 24 hr tablet TAKE 1 TABLET BY MOUTH DAILY  . Cholecalciferol (VITAMIN D) 2000 units tablet Take 2,000 Units by mouth daily.  . Cyanocobalamin (B-12) 500 MCG TABS Take 500 mcg by mouth daily.   . diazepam (VALIUM) 5 MG tablet Take 1 tablet (5 mg total) by mouth every 12 (twelve) hours as needed for anxiety. (Patient taking differently: Take 5 mg by mouth every 12 (twelve) hours as needed for anxiety. Takes 1/2 tablet)  . ELDERBERRY PO Take by mouth daily. Unsure dose / combination vitamin  . EPINEPHrine (EPIPEN 2-PAK) 0.3 mg/0.3 mL IJ SOAJ injection INJECT AS DIRECTED FOR SEVERE ALLERGIC REACTION (Patient taking differently: Inject 0.3 mg into the muscle as needed (for severe allergic reaction). )  . fluocinonide gel (LIDEX) AB-123456789 % Apply 1 application topically as needed.  . Garlic [id number] MG TABS Take 500 mg by mouth daily. Every other day  . hydrochlorothiazide (HYDRODIURIL) 12.5 MG tablet Take 1 tablet (12.5 mg total) by mouth daily.  Marland Kitchen levothyroxine (SYNTHROID) 100 MCG tablet TAKE 1 TABLET BY MOUTH DAILY  . lisinopril (PRINIVIL,ZESTRIL) 40 MG tablet TAKE 1 TABLET BY MOUTH EVERY DAY  . metoprolol tartrate 37.5 MG TABS Take 37.5 mg by mouth 2 (two) times  daily.  . Multiple Vitamin (MULTI-VITAMINS) TABS Take 1 tablet by mouth daily.  Marland Kitchen nystatin (MYCOSTATIN) 100000 UNIT/ML suspension Take 100,000 Units by mouth 3 (three) times daily as needed. For mouth ulcers.  . predniSONE (DELTASONE) 10 MG tablet Take 2 tablets by mouth daily X 1 week, then 1 tablet by mouth daily.  . rosuvastatin (CRESTOR) 40 MG tablet TAKE ONE TABLET BY MOUTH EVERY DAY (Patient taking differently: Take 20 mg by mouth daily. Cuts in half (20 mg))  . sertraline (ZOLOFT) 25 MG tablet Take 1 tablet (25 mg total) by mouth daily.  . tacrolimus (PROTOPIC) 0.1 % ointment As needed for rosacea flare ups  . traMADol (ULTRAM) 50 MG tablet Take 1 tablet (50 mg total) by mouth every 6 (six) hours as needed for moderate pain.  Marland Kitchen zolpidem (AMBIEN) 5 MG tablet TAKE 1 TABLET BY MOUTH AT BEDTIME AS NEEDED FOR SLEEP (Patient taking differently: Take 2.5 mg by mouth at bedtime as needed (takes a half tablet (2.5)). )  . oxymetazoline (AFRIN) 0.05 % nasal spray Place 1 spray into both nostrils as needed for congestion.   No facility-administered encounter medications on file as of 02/21/2019.     Activities of Daily Living In your present state of health, do you have any difficulty performing the following activities: 02/21/2019 10/26/2018  Hearing? N N  Vision? Y N  Comment Unable to see out of left eye due to previous stroke. -  Difficulty concentrating or making decisions? N N  Walking or climbing stairs? Y N  Dressing or bathing? N N  Doing errands, shopping? N N  Preparing Food and eating ? N -  Using the Toilet? N -  In the past six months, have you accidently leaked urine? N -  Do you have problems with loss of bowel control? N -  Managing your Medications? Y -  Comment Husband tends to medication. -  Managing your Finances?  Y -  Comment Husband manages finances. -  Housekeeping or managing your Housekeeping? N -  Some recent data might be hidden    Patient Care Team: [name], MD as PCP - General (Family Medicine) Leandrew Koyanagi, MD as Referring Physician (Ophthalmology) Corey Skains, MD as Consulting Physician (Cardiology) Eulogio Bear, MD as Consulting Physician (Ophthalmology) Beverly Gust, MD (Otolaryngology)    Assessment:   This is a routine wellness examination for Welch.  Exercise Activities and Dietary recommendations Current Exercise Habits: The patient does not participate in regular exercise at present, Exercise limited by: orthopedic condition(s)  Goals    . Cut out extra servings     Recommend eating dinner before 7 o'clock every night.     Marland Kitchen DIET - REDUCE FAT INTAKE     Recommend cutting back on fatty foods in diet to help aid in weight loss.     . Exercise 150 minutes per week (moderate activity)     Recommend to try and exercise 3 days a week for at least 30 minutes at a time.        Fall Risk: Fall Risk  02/21/2019 08/22/2018 12/20/2017 12/17/2016 12/04/2015  Falls in the past year? 0 1 No No No  Number falls in past yr: 0 0 - - -  Injury with Fall? 0 0 - - -    FALL RISK PREVENTION PERTAINING TO THE HOME:  Any stairs in or around the home? Yes  If so, are there any without handrails? No   Home free of loose throw rugs in walkways, pet beds, electrical cords, etc? Yes  Adequate lighting in your home to reduce risk of falls? Yes   ASSISTIVE DEVICES UTILIZED TO PREVENT FALLS:  Life alert? No  Use of a cane, walker or w/c? No  Grab bars in the bathroom? No  Shower chair or bench in shower? Yes  Elevated toilet seat or a handicapped toilet? No    TIMED UP AND GO:  Was the test performed? No .    Depression Screen PHQ 2/9 Scores 02/21/2019 08/22/2018 12/20/2017 04/27/2017  PHQ - 2 Score 2 2 2 3   PHQ- 9 Score 7 - 15 10     Cognitive Function: Declined today.      6CIT Screen 12/17/2016  What Year? 0 points  What month? 0 points  What time? 0 points  Count back from 20 0 points   Months in reverse 0 points  Repeat phrase 2 points  Total Score 2    Immunization History  Administered Date(s) Administered  . Influenza, High Dose Seasonal PF 02/17/2017, 02/14/2018  . Influenza,inj,Quad PF,6+ Mos 01/24/2015  . Influenza-Unspecified 02/16/2017  . Pneumococcal Conjugate-13 11/22/2013  . Pneumococcal Polysaccharide-23 03/30/2005, 11/19/2011  . Tdap 10/01/2005  . Zoster 11/19/2011    Qualifies for Shingles Vaccine? Yes  Zostavax completed 11/19/11. Due for Shingrix. Education has been provided regarding the importance of this vaccine. Pt has been advised to call insurance company to determine out of pocket expense. Advised may also receive vaccine at local pharmacy or Health Dept. Verbalized acceptance and understanding.  Tdap: Up to date  Flu Vaccine: Due for Flu vaccine. Does the patient want to receive this vaccine today?  Yes .   Pneumococcal Vaccine: Completed series  Screening Tests Health Maintenance  Topic Date Due  . MAMMOGRAM  12/16/2016  . TETANUS/TDAP  09/06/2025  . INFLUENZA VACCINE  Completed  . DEXA SCAN  Completed  . PNA  vac Low Risk Adult  Completed    Cancer Screenings:  Colorectal Screening: No longer required.   Mammogram: Completed 12/17/15. Ordered 10/24/18. Pt to schedule apt this year.   Bone Density: Completed 12/27/15. Results reflect NORMAL. No repeat needed unless advised by a physician.  Lung Cancer Screening: (Low Dose CT Chest recommended if Age 74-80 years, 30 pack-year currently smoking OR have quit w/in 15years.) does not qualify.   Additional Screening:  Vision Screening: Recommended annual ophthalmology exams for early detection of glaucoma and other disorders of the eye.  Dental Screening: Recommended annual dental exams for proper oral hygiene  Community Resource Referral:  CRR required this visit?  No       Plan:  I have personally reviewed and addressed the Medicare Annual Wellness questionnaire and have noted  the following in the patient's chart:  A. Medical and social history B. Use of alcohol, tobacco or illicit drugs  C. Current medications and supplements D. Functional ability and status E.  Nutritional status F.  Physical activity G. Advance directives H. List of other physicians I.  Hospitalizations, surgeries, and ER visits in previous 12 months J.  [place] such as hearing and vision if needed, cognitive and depression L. Referrals and appointments   In addition, I have reviewed and discussed with patient certain preventive protocols, quality metrics, and best practice recommendations. A written personalized care plan for preventive services as well as general preventive health recommendations were provided to patient. Nurse Health Advisor  Signed,    Maddisen Vought [place], [place]  [id number] Nurse Health Advisor   Nurse Notes: Pt to set up mammogram this year.

## 2019-02-20 NOTE — Progress Notes (Signed)
Patient: Stacey [name] Female    DOB: 04-04-1941   78 y.o.   MRN: [id number] Visit Date: 02/21/2019  Today's Provider: Wilhemena Durie, MD   Chief Complaint  Patient presents with  . Follow-up   Subjective:  Patient had AWE today.  HPI Patient comes in today for a follow up to her AWE. She is having some back spasm. She has recovered from mild covid infection. She is asymptomatic. This was from 3 weeks ago. She also has a dry cough.Her back pain is ongoing and bothering her a lot.Some bilateral sciatica.  Hypertension, follow-up:  BP Readings from Last 3 Encounters:  02/21/19 132/72  02/21/19 132/72  12/05/18 133/80    She was last seen for hypertension 2 months ago.  BP at that visit was food.. Management since that visit includes ACEI.. She reports excellent compliance with treatment She is having side effects. Cough She is not exercising. She is adherent to low salt diet.   Outside blood pressures are good. She is experiencing none.  Patient denies none.   Cardiovascular risk factors include none.  Use of agents associated with hypertension: none.     Weight trend: stable Wt Readings from Last 3 Encounters:  02/21/19 167 lb (75.8 kg)  02/21/19 167 lb (75.8 kg)  11/02/18 172 lb (78 kg)    Current diet: well balanced   Allergies  Allergen Reactions  . Cephalexin Anaphylaxis  . Atorvastatin   . Morphine And Related Hives  . Procaine Other (See Comments)    Chest pain Chest pain  . Shellfish Allergy Swelling    angioedema  . Statins Other (See Comments)    Muscle pain  . Tomato Other (See Comments)    (by testing) - also beans, wheat, peas (by testing) - also beans, wheat, peas  . Penicillins Hives and Rash    Has patient had a PCN reaction causing immediate rash, facial/tongue/throat swelling, SOB or lightheadedness with hypotension: Yes Has patient had a PCN reaction causing severe rash involving mucus membranes or skin necrosis: No  Has patient had a PCN reaction that required hospitalization: No Has patient had a PCN reaction occurring within the last 10 years: Yes If all of the above answers are "NO", then may proceed with Cephalosporin use.     Current Outpatient Medications:  .  acetaminophen (TYLENOL) 325 MG tablet, Take 2 tablets (650 mg total) by mouth every 6 (six) hours as needed for mild pain., Disp: , Rfl:  .  albuterol (VENTOLIN HFA) 108 (90 Base) MCG/ACT inhaler, INHALE 2 PUFFS BY MOUTH EVERY 4 TO 6 HOURS AS NEEDED FOR SHORTNESS OF BREATH, Disp: 18 Inhaler, Rfl: 5 .  amLODipine (NORVASC) 5 MG tablet, Take 1 tablet (5 mg total) daily by mouth., Disp: 90 tablet, Rfl: 3 .  apixaban (ELIQUIS) 5 MG TABS tablet, Take 1 tablet (5 mg total) by mouth 2 (two) times daily., Disp: 60 tablet, Rfl: 2 .  ascorbic acid (VITAMIN C) 500 MG tablet, Take 500 mg by mouth daily., Disp: , Rfl:  .  aspirin 81 MG tablet, Take 1 tablet (81 mg total) by mouth daily., Disp: 30 tablet, Rfl: 2 .  buPROPion (WELLBUTRIN XL) 300 MG 24 hr tablet, TAKE 1 TABLET BY MOUTH DAILY, Disp: 90 tablet, Rfl: 3 .  Cholecalciferol (VITAMIN D) 2000 units tablet, Take 2,000 Units by mouth daily., Disp: , Rfl:  .  Cyanocobalamin (B-12) 500 MCG TABS, Take 500 mcg by mouth daily. ,  Disp: , Rfl:  .  diazepam (VALIUM) 5 MG tablet, Take 1 tablet (5 mg total) by mouth every 12 (twelve) hours as needed for anxiety. (Patient taking differently: Take 5 mg by mouth every 12 (twelve) hours as needed for anxiety. Takes 1/2 tablet), Disp: 60 tablet, Rfl: 1 .  ELDERBERRY PO, Take by mouth daily. Unsure dose / combination vitamin, Disp: , Rfl:  .  EPINEPHrine (EPIPEN 2-PAK) 0.3 mg/0.3 mL IJ SOAJ injection, INJECT AS DIRECTED FOR SEVERE ALLERGIC REACTION (Patient taking differently: Inject 0.3 mg into the muscle as needed (for severe allergic reaction). ), Disp: 1 Device, Rfl: 1 .  fluocinonide gel (LIDEX) AB-123456789 %, Apply 1 application topically as needed., Disp: , Rfl:  .   Garlic [id number] MG TABS, Take 500 mg by mouth daily. Every other day, Disp: , Rfl:  .  hydrochlorothiazide (HYDRODIURIL) 12.5 MG tablet, Take 1 tablet (12.5 mg total) by mouth daily., Disp: 30 tablet, Rfl: 11 .  levothyroxine (SYNTHROID) 100 MCG tablet, TAKE 1 TABLET BY MOUTH DAILY, Disp: 90 tablet, Rfl: 3 .  lisinopril (PRINIVIL,ZESTRIL) 40 MG tablet, TAKE 1 TABLET BY MOUTH EVERY DAY, Disp: 90 tablet, Rfl: 2 .  metoprolol tartrate 37.5 MG TABS, Take 37.5 mg by mouth 2 (two) times daily., Disp: 60 tablet, Rfl: 0 .  Multiple Vitamin (MULTI-VITAMINS) TABS, Take 1 tablet by mouth daily., Disp: , Rfl:  .  nystatin (MYCOSTATIN) 100000 UNIT/ML suspension, Take 100,000 Units by mouth 3 (three) times daily as needed. For mouth ulcers., Disp: , Rfl:  .  oxymetazoline (AFRIN) 0.05 % nasal spray, Place 1 spray into both nostrils as needed for congestion., Disp: , Rfl:  .  predniSONE (DELTASONE) 10 MG tablet, Take 2 tablets by mouth daily X 1 week, then 1 tablet by mouth daily., Disp: 50 tablet, Rfl: 0 .  rosuvastatin (CRESTOR) 40 MG tablet, TAKE ONE TABLET BY MOUTH EVERY DAY (Patient taking differently: Take 20 mg by mouth daily. Cuts in half (20 mg)), Disp: 30 tablet, Rfl: 11 .  sertraline (ZOLOFT) 25 MG tablet, Take 1 tablet (25 mg total) by mouth daily., Disp: 30 tablet, Rfl: 11 .  tacrolimus (PROTOPIC) 0.1 % ointment, As needed for rosacea flare ups, Disp: , Rfl:  .  traMADol (ULTRAM) 50 MG tablet, Take 1 tablet (50 mg total) by mouth every 6 (six) hours as needed for moderate pain., Disp: 90 tablet, Rfl: 3 .  zolpidem (AMBIEN) 5 MG tablet, TAKE 1 TABLET BY MOUTH AT BEDTIME AS NEEDED FOR SLEEP (Patient taking differently: Take 2.5 mg by mouth at bedtime as needed (takes a half tablet (2.5)). ), Disp: 30 tablet, Rfl: 5  Review of Systems  Constitutional: Negative for activity change, appetite change, chills, diaphoresis, fatigue, fever and unexpected weight change.  HENT: Negative.   Eyes: Negative.    Respiratory: Positive for cough. Negative for shortness of breath.   Cardiovascular: Negative for chest pain, palpitations and leg swelling.  Endocrine: Negative.   Musculoskeletal: Positive for back pain and myalgias. Negative for arthralgias.  Allergic/Immunologic: Negative.   Neurological: Negative for dizziness, light-headedness and headaches.  Psychiatric/Behavioral: Negative for agitation, self-injury, sleep disturbance and suicidal ideas. The patient is not nervous/anxious.     Social History   Tobacco Use  . Smoking status: Former Smoker    Quit date: 05/26/1975    Years since quitting: 43.7  . Smokeless tobacco: Never Used  . Tobacco comment: quit 45  years ago   Substance Use Topics  . Alcohol use:  No      Objective:   BP 132/72 (BP Location: Right Arm, Patient Position: Sitting, Cuff Size: Normal)   Pulse 85   Temp 98.7 F (37.1 C) (Oral)   Resp 18   Wt 167 lb (75.8 kg)   SpO2 98%   BMI 30.54 kg/m  Vitals:   02/21/19 1432  BP: 132/72  Pulse: 85  Resp: 18  Temp: 98.7 F (37.1 C)  TempSrc: Oral  SpO2: 98%  Weight: 167 lb (75.8 kg)  Body mass index is 30.54 kg/m.   Physical Exam Vitals signs reviewed.  Constitutional:      Appearance: She is well-developed.  HENT:     Head: Normocephalic and atraumatic.     Right Ear: External ear normal.     Left Ear: External ear normal.     Nose: Nose normal.  Eyes:     General: No scleral icterus.    Conjunctiva/sclera: Conjunctivae normal.  Neck:     Thyroid: No thyromegaly.  Cardiovascular:     Rate and Rhythm: Normal rate and regular rhythm.     Heart sounds: Normal heart sounds.  Pulmonary:     Effort: Pulmonary effort is normal.     Breath sounds: Normal breath sounds.  Abdominal:     Palpations: Abdomen is soft.  Skin:    General: Skin is warm and dry.  Neurological:     Mental Status: She is alert and oriented to person, place, and time. Mental status is at baseline.     Comments: Nonfocal.   Psychiatric:        Mood and Affect: Mood normal.        Behavior: Behavior normal.        Thought Content: Thought content normal.        Judgment: Judgment normal.      No results found for any visits on 02/21/19.     Assessment & Plan    1. Back pain, unspecified back location, unspecified back pain laterality, unspecified chronicity Refer to pain clinc.More than 50% 25 minute visit spent in counseling or coordination of care  - ketorolac (TORADOL) injection 60 mg - Ambulatory referral to Pain Clinic  2. DDD (degenerative disc disease), lumbosacral  - Ambulatory referral to Pain Clinic  3. Low back pain, unspecified back pain laterality, unspecified chronicity, unspecified whether sciatica present  - Ambulatory referral to Pain Clinic  4. Hyperlipidemia LDL goal <70  - rosuvastatin (CRESTOR) 20 MG tablet; Take 1 tablet (20 mg total) by mouth daily.  Dispense: 90 tablet; Refill: 3  5. COVID-19 virus infection Resolved. 6.HTN 7.Cough Stop lisinopril. RTC 2-4 weeks.     Richard Cranford Mon, MD  [facility]

## 2019-02-21 ENCOUNTER — Encounter: Payer: Self-pay | Admitting: Family Medicine

## 2019-02-21 ENCOUNTER — Ambulatory Visit (INDEPENDENT_AMBULATORY_CARE_PROVIDER_SITE_OTHER): Payer: Medicare Other | Admitting: Family Medicine

## 2019-02-21 ENCOUNTER — Ambulatory Visit (INDEPENDENT_AMBULATORY_CARE_PROVIDER_SITE_OTHER): Payer: Medicare Other

## 2019-02-21 ENCOUNTER — Other Ambulatory Visit: Payer: Self-pay

## 2019-02-21 VITALS — BP 132/72 | HR 85 | Temp 98.7°F | Resp 18 | Wt 167.0 lb

## 2019-02-21 VITALS — BP 132/72 | HR 85 | Temp 98.7°F | Ht 62.0 in | Wt 167.0 lb

## 2019-02-21 DIAGNOSIS — M5137 Other intervertebral disc degeneration, lumbosacral region: Secondary | ICD-10-CM

## 2019-02-21 DIAGNOSIS — Z23 Encounter for immunization: Secondary | ICD-10-CM | POA: Diagnosis not present

## 2019-02-21 DIAGNOSIS — U071 COVID-19: Secondary | ICD-10-CM | POA: Diagnosis not present

## 2019-02-21 DIAGNOSIS — Z0000 Encounter for general adult medical examination without abnormal findings: Secondary | ICD-10-CM | POA: Diagnosis not present

## 2019-02-21 DIAGNOSIS — E785 Hyperlipidemia, unspecified: Secondary | ICD-10-CM | POA: Diagnosis not present

## 2019-02-21 DIAGNOSIS — M545 Low back pain, unspecified: Secondary | ICD-10-CM

## 2019-02-21 DIAGNOSIS — I25709 Atherosclerosis of coronary artery bypass graft(s), unspecified, with unspecified angina pectoris: Secondary | ICD-10-CM | POA: Diagnosis not present

## 2019-02-21 DIAGNOSIS — M549 Dorsalgia, unspecified: Secondary | ICD-10-CM | POA: Diagnosis not present

## 2019-02-21 MED ORDER — KETOROLAC TROMETHAMINE 60 MG/2ML IM SOLN
60.0000 mg | Freq: Once | INTRAMUSCULAR | Status: AC
  Administered 2019-02-21: 60 mg via INTRAMUSCULAR

## 2019-02-21 MED ORDER — ROSUVASTATIN CALCIUM 20 MG PO TABS: 20.0000 mg | ORAL_TABLET | Freq: Every day | ORAL | 3 refills | Status: DC

## 2019-02-21 NOTE — Patient Instructions (Signed)
Stacey [name] , Thank you for taking time to come for your Medicare Wellness Visit. I appreciate your ongoing commitment to your health goals. Please review the following plan we discussed and let me know if I can assist you in the future.   Screening recommendations/referrals: Colonoscopy: No longer required.  Mammogram: Currently due, ordered 10/24/18. Please schedule apt at imaging site.  Bone Density: Up to date. Previous DEXA was normal. No repeat needed unless advised by a physician.  Recommended yearly ophthalmology/optometry visit for glaucoma screening and checkup Recommended yearly dental visit for hygiene and checkup  Vaccinations: Influenza vaccine: Administered today.  Pneumococcal vaccine: Completed series Tdap vaccine: Up to date, due 08/2025 Shingles vaccine: Pt declines today.     Advanced directives: Advance directive discussed with you today. Even though you declined this today please call our office should you change your mind and we can give you the proper paperwork for you to fill out.  Conditions/risks identified: Obesity- recommend to try and exercise 3 days a week for at least 30 minutes at a time.   Next appointment: 2:00 PM today with Dr [name].    Preventive Care 41 Years and Older, Female Preventive care refers to lifestyle choices and visits with your health care provider that can promote health and wellness. What does preventive care include?  A yearly physical exam. This is also called an annual well check.  Dental exams once or twice a year.  Routine eye exams. Ask your health care provider how often you should have your eyes checked.  Personal lifestyle choices, including:  Daily care of your teeth and gums.  Regular physical activity.  Eating a healthy diet.  Avoiding tobacco and drug use.  Limiting alcohol use.  Practicing safe sex.  Taking low-dose aspirin every day.  Taking vitamin and mineral supplements as recommended by your health  care provider. What happens during an annual well check? The services and screenings done by your health care provider during your annual well check will depend on your age, overall health, lifestyle risk factors, and family history of disease. Counseling  Your health care provider may ask you questions about your:  Alcohol use.  Tobacco use.  Drug use.  Emotional well-being.  Home and relationship well-being.  Sexual activity.  Eating habits.  History of falls.  Memory and ability to understand (cognition).  Work and work Statistician.  Reproductive health. Screening  You may have the following tests or measurements:  Height, weight, and BMI.  Blood pressure.  Lipid and cholesterol levels. These may be checked every 5 years, or more frequently if you are over 57 years old.  Skin check.  Lung cancer screening. You may have this screening every year starting at age 47 if you have a 30-pack-year history of smoking and currently smoke or have quit within the past 15 years.  Fecal occult blood test (FOBT) of the stool. You may have this test every year starting at age 61.  Flexible sigmoidoscopy or colonoscopy. You may have a sigmoidoscopy every 5 years or a colonoscopy every 10 years starting at age 5.  Hepatitis C blood test.  Hepatitis B blood test.  Sexually transmitted disease (STD) testing.  Diabetes screening. This is done by checking your blood sugar (glucose) after you have not eaten for a while (fasting). You may have this done every 1-3 years.  Bone density scan. This is done to screen for osteoporosis. You may have this done starting at age 67.  Mammogram. This  may be done every 1-2 years. Talk to your health care provider about how often you should have regular mammograms. Talk with your health care provider about your test results, treatment options, and if necessary, the need for more tests. Vaccines  Your health care provider may recommend certain  vaccines, such as:  Influenza vaccine. This is recommended every year.  Tetanus, diphtheria, and acellular pertussis (Tdap, Td) vaccine. You may need a Td booster every 10 years.  Zoster vaccine. You may need this after age 49.  Pneumococcal 13-valent conjugate (PCV13) vaccine. One dose is recommended after age 51.  Pneumococcal polysaccharide (PPSV23) vaccine. One dose is recommended after age 17. Talk to your health care provider about which screenings and vaccines you need and how often you need them. This information is not intended to replace advice given to you by your health care provider. Make sure you discuss any questions you have with your health care provider. Document Released: 06/07/2015 Document Revised: 01/29/2016 Document Reviewed: 03/12/2015 Elsevier Interactive Patient Education  2017 [employer] Prevention in the Home Falls can cause injuries. They can happen to people of all ages. There are many things you can do to make your home safe and to help prevent falls. What can I do on the outside of my home?  Regularly fix the edges of walkways and driveways and fix any cracks.  Remove anything that might make you trip as you walk through a door, such as a raised step or threshold.  Trim any bushes or trees on the path to your home.  Use bright outdoor lighting.  Clear any walking paths of anything that might make someone trip, such as rocks or tools.  Regularly check to see if handrails are loose or broken. Make sure that both sides of any steps have handrails.  Any raised decks and porches should have guardrails on the edges.  Have any leaves, snow, or ice cleared regularly.  Use sand or salt on walking paths during winter.  Clean up any spills in your garage right away. This includes oil or grease spills. What can I do in the bathroom?  Use night lights.  Install grab bars by the toilet and in the tub and shower. Do not use towel bars as grab  bars.  Use non-skid mats or decals in the tub or shower.  If you need to sit down in the shower, use a plastic, non-slip stool.  Keep the floor dry. Clean up any water that spills on the floor as soon as it happens.  Remove soap buildup in the tub or shower regularly.  Attach bath mats securely with double-sided non-slip rug tape.  Do not have throw rugs and other things on the floor that can make you trip. What can I do in the bedroom?  Use night lights.  Make sure that you have a light by your bed that is easy to reach.  Do not use any sheets or blankets that are too big for your bed. They should not hang down onto the floor.  Have a firm chair that has side arms. You can use this for support while you get dressed.  Do not have throw rugs and other things on the floor that can make you trip. What can I do in the kitchen?  Clean up any spills right away.  Avoid walking on wet floors.  Keep items that you use a lot in easy-to-reach places.  If you need to reach something above  you, use a strong step stool that has a grab bar.  Keep electrical cords out of the way.  Do not use floor polish or wax that makes floors slippery. If you must use wax, use non-skid floor wax.  Do not have throw rugs and other things on the floor that can make you trip. What can I do with my stairs?  Do not leave any items on the stairs.  Make sure that there are handrails on both sides of the stairs and use them. Fix handrails that are broken or loose. Make sure that handrails are as long as the stairways.  Check any carpeting to make sure that it is firmly attached to the stairs. Fix any carpet that is loose or worn.  Avoid having throw rugs at the top or bottom of the stairs. If you do have throw rugs, attach them to the floor with carpet tape.  Make sure that you have a light switch at the top of the stairs and the bottom of the stairs. If you do not have them, ask someone to add them for  you. What else can I do to help prevent falls?  Wear shoes that:  Do not have high heels.  Have rubber bottoms.  Are comfortable and fit you well.  Are closed at the toe. Do not wear sandals.  If you use a stepladder:  Make sure that it is fully opened. Do not climb a closed stepladder.  Make sure that both sides of the stepladder are locked into place.  Ask someone to hold it for you, if possible.  Clearly mark and make sure that you can see:  Any grab bars or handrails.  First and last steps.  Where the edge of each step is.  Use tools that help you move around (mobility aids) if they are needed. These include:  Canes.  Walkers.  Scooters.  Crutches.  Turn on the lights when you go into a dark area. Replace any light bulbs as soon as they burn out.  Set up your furniture so you have a clear path. Avoid moving your furniture around.  If any of your floors are uneven, fix them.  If there are any pets around you, be aware of where they are.  Review your medicines with your doctor. Some medicines can make you feel dizzy. This can increase your chance of falling. Ask your doctor what other things that you can do to help prevent falls. This information is not intended to replace advice given to you by your health care provider. Make sure you discuss any questions you have with your health care provider. Document Released: 03/07/2009 Document Revised: 10/17/2015 Document Reviewed: 06/15/2014 Elsevier Interactive Patient Education  2017 [employer].

## 2019-02-21 NOTE — Patient Instructions (Signed)
1. Stop taking Lisinopril because of cough. 2. Reduce Prednisone to 1/2 tablet for one week. 3. Reduce Crestor from 40 mg to 20 mg.

## 2019-02-22 ENCOUNTER — Encounter: Payer: Medicare Other | Admitting: Physical Therapy

## 2019-02-27 ENCOUNTER — Ambulatory Visit: Payer: Medicare Other | Admitting: Physical Therapy

## 2019-02-27 ENCOUNTER — Telehealth: Payer: Self-pay | Admitting: Physical Therapy

## 2019-02-27 ENCOUNTER — Encounter: Payer: Self-pay | Admitting: Physical Therapy

## 2019-02-27 DIAGNOSIS — R262 Difficulty in walking, not elsewhere classified: Secondary | ICD-10-CM

## 2019-02-27 DIAGNOSIS — M62830 Muscle spasm of back: Secondary | ICD-10-CM

## 2019-02-27 DIAGNOSIS — M6281 Muscle weakness (generalized): Secondary | ICD-10-CM

## 2019-02-27 DIAGNOSIS — M25551 Pain in right hip: Secondary | ICD-10-CM

## 2019-02-27 DIAGNOSIS — M5440 Lumbago with sciatica, unspecified side: Secondary | ICD-10-CM

## 2019-02-27 NOTE — Therapy (Signed)
[facility] [address], [place], [address] Phone: [phone]   Fax:  [phone]  Physical Therapy No-Visit Discharge Summary Reporting Period: 11/30/2018 - 01/28/2019  Patient Details  Name: Stacey [name] MRN: [id number] Date of Birth: 03/26/1941 Referring Provider (PT): Miguel Aschoff   Encounter Date: 02/27/2019    Past Medical History:  Diagnosis Date  . Abnormal nuclear stress test 05/24/2017  . Arthritis   . Asthma   . Atrial fibrillation, transient ([place])   . 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 Cancer ([place])   . Complication of anesthesia   . COVID-19   . Depression   . Family history of adverse reaction to anesthesia    most of family - PONV  . Fibromyalgia   . Glaucoma   . Headache    migraines - 1-2x/mo  . HTN (hypertension) 03/09/2007  . Hyperlipidemia LDL goal <70 03/25/1993  . Hypertension   . Motion sickness    all moving vehicles  . PONV (postoperative nausea and vomiting)   . Stroke ([place]) 2019  . Thyroid disease     Past Surgical History:  Procedure Laterality Date  . ABDOMINAL HYSTERECTOMY    . APPENDECTOMY    . BLADDER SURGERY    . CATARACT EXTRACTION W/ INTRAOCULAR LENS  IMPLANT, BILATERAL    . COLONOSCOPY WITH PROPOFOL N/A 01/17/2016   Procedure: COLONOSCOPY WITH PROPOFOL;  Surgeon: [name], MD;  Location: [facility];  Service: Endoscopy;  Laterality: N/A;  . CORONARY ARTERY BYPASS GRAFT N/A 02/20/2017   Procedure: CORONARY ARTERY BYPASS GRAFTING (CABG) x , three using left internal mammary artery to left anterior descending coronary artery and right greater saphenous vein harvested endoscopically to distal right and diagonal coronary arteries.;  Surgeon: Grace Isaac, MD;  Location: [facility];  Service: Open Heart Surgery;  Laterality: N/A;  . LEFT HEART CATH AND CORONARY ANGIOGRAPHY N/A 02/20/2017   Procedure:  LEFT HEART CATH AND CORONARY ANGIOGRAPHY;  Surgeon: Isaias Cowman, MD;  Location: [facility];  Service: Cardiovascular;  Laterality: N/A;  . LEFT HEART CATH AND CORS/GRAFTS ANGIOGRAPHY N/A 05/24/2017   Procedure: LEFT HEART CATH AND CORS/GRAFTS ANGIOGRAPHY;  Surgeon: Sherren Mocha, MD;  Location: [facility];  Service: Cardiovascular;  Laterality: N/A;  . NASAL SINUS SURGERY    . POLYPECTOMY  01/17/2016   Procedure: POLYPECTOMY;  Surgeon: [name], MD;  Location: [facility];  Service: Endoscopy;;  . RIGHT OOPHORECTOMY    . TEE WITHOUT CARDIOVERSION N/A 02/20/2017   Procedure: TRANSESOPHAGEAL ECHOCARDIOGRAM (TEE);  Surgeon: Grace Isaac, MD;  Location: [facility];  Service: Open Heart Surgery;  Laterality: N/A;    There were no vitals filed for this visit.  Subjective Assessment - 02/27/19 1314    Subjective  Spoke with patient by phone today and she reported that she had seen her physician he determined she is not improving from physical therapy, so he reccomended discontinuing PT and referred her to a pain clinic. Patient was very concerned that she keep her comittments, so she was still planning to come to her PT appointment scheduled for this evening. Patient states she has no questions or concerns that she would like to address with physical therapy and agreed that cancelling her appointment this evening and discharging from physical therapy would be best at this point. She stated she is very pleased with the PT  care she received and is very dissapointed that her condition did not responde better to PT.    Pertinent History  PMH includes atrial fibrillation, CAD, cancer (skin), depression, fibromyalgia, HTN, HLD, CVA in 2019, history of MI with CABG in 2018. Patient has had low back pain and hip pain for years, has had therapy in the past with good results. Reported she is most limited in functional abilities, standing and walking.    Limitations   Walking;Sitting;Standing;House hold activities;Lifting    How long can you sit comfortably?  can sit how long she wants if she often bends forward    How long can you stand comfortably?  unable to tolerated standing at all in one position    How long can you walk comfortably?  15 minutes    Diagnostic tests  Diffuse multilevel degenerative change with scoliosis concaveleft. 2 mm retrolisthesis L1 on L2 and L2 on L3. This most likelydegenerative. No acute bony abnormality.Aortoiliac and visceral atherosclerotic vascular disease. Mildectasia of the abdominal aorta can not be excluded. Aorticultrasound can be obtained to further evaluate.    Patient Stated Goals  Patient would like to decrease her pain, improve her ability to move    Pain Onset  More than a month ago       OBJECTIVE Patient is not present for examination. Please see prior documentation for latest objective data.    PT Education - 02/27/19 1318    Education provided  Yes    Education Details  purpose of physical therapy, POC, discharge reccomendation rationale    Person(s) Educated  Patient    Methods  Explanation    Comprehension  Verbalized understanding       PT Short Term Goals - 02/27/19 1320      PT SHORT TERM GOAL #1   Title  Patient will be compliant with initial HEP in preparation for self management of condition    Baseline  continues with long term HEP from previous episodes of care.    Time  4    Period  Weeks    Status  Achieved    Target Date  12/28/18        PT Long Term Goals - 02/27/19 1320      PT LONG TERM GOAL #1   Title  Pt will decrease mODI scoreby at least 13 points in order demonstrate clinically significant reduction in back pain/disability.    Baseline  54% (11/30/2018); 49% (01/09/2019);    Time  8    Period  Weeks    Status  Partially Met    Target Date  03/06/19      PT LONG TERM GOAL #2   Title  Pt will be independent with HEP in order to improve strength and decrease back pain  in order to improve pain-free function at home and work.    Baseline  is currently participating in HEP (01/09/2019);    Time  8    Period  Weeks    Status  Partially Met    Target Date  03/06/19      PT LONG TERM GOAL #3   Title  Pt will decrease worst back pain as reported on NPRS by at least 2 points in order to demonstrate clinically significant reduction in back pain.    Baseline  worst pain 10/10 11/30/2018; saturday it made her nauseaous, rates at least 8-9/10 (01/09/2019);    Time  8    Period  Weeks    Status  Partially Met    Target Date  03/06/19      PT LONG TERM GOAL #4   Title  Pt will increase strength of by at least 1/2 MMT grade in order to demonstrate improvement in strength and function.    Baseline  see objective of eval 11/30/2018, progress note 01/09/2019;    Time  8    Period  Weeks    Status  Partially Met    Target Date  03/06/19      PT LONG TERM GOAL #5   Title  Patient will report that she is able to walk at least 37mnutes with low back pain/hip pain no greater than 5/10 to improve pts tolerance to endurance activities to increase pt's overall health as well as ability to perform functional activities.    Baseline  ~181mutes 11/30/2018, 01/09/2019;    Time  8    Period  Weeks    Status  Not Met    Target Date  03/06/19        Plan - 02/27/19 1325    Clinical Impression Statement  Patient attended 15 physical therapy sessions this episode of care. She initially made mild progress towards goals before discontinuing physical therapy temporarily due to diagnosis with COVID19. Prior to her scheduled return, she followed up with her referring physician who felt she was not making significant progress with physical therapy and she agreed. Patient states she has no outstanding questions or concerns that she feels physical therapy could address and is recommended for discharge without further treatment sessions due to lack of meaningful progress at this point. Patient  was provided with appropriate HEP for her current condition at her last treatment session    Personal Factors and Comorbidities  Age;Time since onset of injury/illness/exacerbation;Comorbidity 3+;Profession;Past/Current Experience    Comorbidities  atrial fibrillation, CVA, MI, HTN, HLD, cancer, hypothyrodism    Examination-Activity Limitations  Bathing;Hygiene/Grooming;Squat;Stairs;Lift;Bed Mobility;Bend;Locomotion Level;Stand;Reach Overhead;Caring for Others;Carry;Transfers;Sit;Dressing;Sleep    Examination-Participation Restrictions  Church;Laundry;Shop;Cleaning;Volunteer;Community Activity;Meal Prep;Yard Work;Driving    Stability/Clinical Decision Making  Stable/Uncomplicated    Rehab Potential  Fair    Clinical Impairments Affecting Rehab Potential  (+)motivated, previous improvement with physical therapy(-)chronic condition, pain, recent CABG x 3 02/20/2017, arthritis, HTN, CVA    PT Frequency  2x / week    PT Duration  8 weeks    PT Treatment/Interventions  Cryotherapy;Electrical Stimulation;Moist Heat;Therapeutic exercise;Therapeutic activities;Neuromuscular re-education;Patient/family education;Manual techniques;Balance training;Canalith Repostioning;Traction;Dry needling;Joint Manipulations;Spinal Manipulations;Splinting;Taping;Energy conservation;Gait training;Stair training;Ultrasound;Functional mobility training;Passive range of motion;Vestibular;Aquatic Therapy;ADLs/Self Care Home Management    PT Next Visit Plan  Patient is now discharged from skilled physical therapy due to lack of meaningful progress.    PT Home Exercise Plan  Medbridge Access Code: [email or web address]; Pt reports she performs seated heel/toe raises, SLR, seated marching, heel slides, demonstrated good knowledge of log rolling technique    Consulted and Agree with Plan of Care  Patient       Patient will benefit from skilled therapeutic intervention in order to improve the following deficits and impairments:  Pain, Improper  body mechanics, Increased muscle spasms, Decreased activity tolerance, Decreased endurance, Decreased range of motion, Decreased strength, Impaired perceived functional ability, Postural dysfunction, Decreased mobility, Difficulty walking, Impaired flexibility  Visit Diagnosis: Muscle weakness (generalized)  Muscle spasm of back  Difficulty in walking, not elsewhere classified  Low back pain with sciatica, sciatica laterality unspecified, unspecified back pain laterality, unspecified chronicity  Pain in right hip     Problem List Patient Active Problem List   Diagnosis  Date Noted  . Chest pain 10/26/2018  . TIA (transient ischemic attack) 03/21/2018  . CVA (cerebral vascular accident) ([place]) 10/22/2017  . Abnormal nuclear stress test 05/24/2017  . 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 PAF (paroxysmal atrial fibrillation) ([place])   . Non-STEMI (non-ST elevated myocardial infarction) ([place]) 02/20/2017  . 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 Moderate mitral insufficiency 01/11/2017  . Personal history of colonic polyps   . Benign neoplasm of ascending colon   . Benign neoplasm of descending colon   . Disequilibrium 09/16/2015  . Panic attacks 09/11/2015  . Ganglion cyst 08/16/2015  . Hypernatremia 07/04/2015  . Upper back pain 07/04/2015  . Other osteoarthritis involving multiple joints 02/06/2015  . Clinical depression 09/27/2014  . Dry mouth 09/27/2014  . Fibrositis 09/27/2014  . LBP (low back pain) 09/27/2014  . Avitaminosis D 09/27/2014  . Decreased leukocytes 09/27/2014  . Abnormal blood sugar 05/03/2009  . Insomnia 09/29/2007  . HTN (hypertension) 03/09/2007  . Adaptation reaction 01/29/2007  . Acid reflux 10/07/2006  . Menopausal and postmenopausal disorder 10/05/2006  . Headache, migraine 10/05/2006  . Arthritis, degenerative 03/23/1994  . Adult hypothyroidism 04/15/1993  . Hyperlipidemia LDL goal <70 03/25/1993    Everlean Alstrom.  Graylon Good, PT, DPT 02/27/19, 1:27 PM  [facility] [address], [place], [address] Phone: [phone]   Fax:  [phone]  Name: Stacey [name] MRN: [id number] Date of Birth: Jun 15, 1940

## 2019-02-27 NOTE — Telephone Encounter (Signed)
Called patient to check on her since it has been so long since she has come for treatment. Patient states her doctor told her that physical therapy was not making a difference and he referred her to a pain clinic instead. Patient was still planning to come to her physical therapy appointment scheduled for this evening, but after discussing it with her I recommended that she not come since she and her physician had agreed it was not helping and she did not have any further questions or concerns that could addressed by physical therapy at this point. Patient reported she was very pleased with the service/treatment she had received at our clinic, and was very disappointed that it did not make more of a difference for her. Agreed to discharge her physical therapy episode of care.   Everlean Alstrom. Graylon Good, PT, DPT 02/27/19, 1:12 PM

## 2019-03-01 ENCOUNTER — Encounter: Payer: Medicare Other | Admitting: Physical Therapy

## 2019-03-01 DIAGNOSIS — Z9289 Personal history of other medical treatment: Secondary | ICD-10-CM | POA: Diagnosis not present

## 2019-03-01 DIAGNOSIS — Z1231 Encounter for screening mammogram for malignant neoplasm of breast: Secondary | ICD-10-CM | POA: Diagnosis not present

## 2019-03-01 LAB — HM MAMMOGRAPHY

## 2019-03-02 ENCOUNTER — Encounter: Payer: Self-pay | Admitting: Family Medicine

## 2019-03-06 ENCOUNTER — Encounter: Payer: Medicare Other | Admitting: Physical Therapy

## 2019-03-08 ENCOUNTER — Encounter: Payer: Medicare Other | Admitting: Physical Therapy

## 2019-03-09 DIAGNOSIS — I10 Essential (primary) hypertension: Secondary | ICD-10-CM | POA: Diagnosis not present

## 2019-03-09 DIAGNOSIS — I4819 Other persistent atrial fibrillation: Secondary | ICD-10-CM | POA: Diagnosis not present

## 2019-03-09 DIAGNOSIS — E782 Mixed hyperlipidemia: Secondary | ICD-10-CM | POA: Diagnosis not present

## 2019-03-09 DIAGNOSIS — I25810 Atherosclerosis of coronary artery bypass graft(s) without angina pectoris: Secondary | ICD-10-CM | POA: Diagnosis not present

## 2019-03-13 ENCOUNTER — Encounter: Payer: Medicare Other | Admitting: Physical Therapy

## 2019-03-15 ENCOUNTER — Encounter: Payer: Medicare Other | Admitting: Physical Therapy

## 2019-03-22 ENCOUNTER — Encounter: Payer: Medicare Other | Admitting: Physical Therapy

## 2019-03-27 ENCOUNTER — Encounter: Payer: Medicare Other | Admitting: Physical Therapy

## 2019-03-27 DIAGNOSIS — I10 Essential (primary) hypertension: Secondary | ICD-10-CM | POA: Diagnosis not present

## 2019-03-27 DIAGNOSIS — I4819 Other persistent atrial fibrillation: Secondary | ICD-10-CM | POA: Diagnosis not present

## 2019-03-27 DIAGNOSIS — E782 Mixed hyperlipidemia: Secondary | ICD-10-CM | POA: Diagnosis not present

## 2019-03-27 DIAGNOSIS — I340 Nonrheumatic mitral (valve) insufficiency: Secondary | ICD-10-CM | POA: Diagnosis not present

## 2019-03-27 DIAGNOSIS — I25810 Atherosclerosis of coronary artery bypass graft(s) without angina pectoris: Secondary | ICD-10-CM | POA: Diagnosis not present

## 2019-03-27 DIAGNOSIS — I25708 Atherosclerosis of coronary artery bypass graft(s), unspecified, with other forms of angina pectoris: Secondary | ICD-10-CM | POA: Diagnosis not present

## 2019-03-27 DIAGNOSIS — G4730 Sleep apnea, unspecified: Secondary | ICD-10-CM | POA: Diagnosis not present

## 2019-03-29 ENCOUNTER — Encounter: Payer: Medicare Other | Admitting: Physical Therapy

## 2019-04-11 ENCOUNTER — Ambulatory Visit: Payer: Medicare Other | Admitting: Student in an Organized Health Care Education/Training Program

## 2019-04-25 DIAGNOSIS — I4819 Other persistent atrial fibrillation: Secondary | ICD-10-CM | POA: Diagnosis not present

## 2019-04-25 DIAGNOSIS — I25810 Atherosclerosis of coronary artery bypass graft(s) without angina pectoris: Secondary | ICD-10-CM | POA: Diagnosis not present

## 2019-04-25 DIAGNOSIS — I10 Essential (primary) hypertension: Secondary | ICD-10-CM | POA: Diagnosis not present

## 2019-04-25 DIAGNOSIS — E782 Mixed hyperlipidemia: Secondary | ICD-10-CM | POA: Diagnosis not present

## 2019-04-25 DIAGNOSIS — I340 Nonrheumatic mitral (valve) insufficiency: Secondary | ICD-10-CM | POA: Diagnosis not present

## 2019-05-31 DIAGNOSIS — H6121 Impacted cerumen, right ear: Secondary | ICD-10-CM | POA: Diagnosis not present

## 2019-05-31 DIAGNOSIS — H60541 Acute eczematoid otitis externa, right ear: Secondary | ICD-10-CM | POA: Diagnosis not present

## 2019-05-31 DIAGNOSIS — K121 Other forms of stomatitis: Secondary | ICD-10-CM | POA: Diagnosis not present

## 2019-06-13 DIAGNOSIS — I25708 Atherosclerosis of coronary artery bypass graft(s), unspecified, with other forms of angina pectoris: Secondary | ICD-10-CM | POA: Diagnosis not present

## 2019-06-13 DIAGNOSIS — I10 Essential (primary) hypertension: Secondary | ICD-10-CM | POA: Diagnosis not present

## 2019-06-13 DIAGNOSIS — I25810 Atherosclerosis of coronary artery bypass graft(s) without angina pectoris: Secondary | ICD-10-CM | POA: Diagnosis not present

## 2019-06-13 DIAGNOSIS — E782 Mixed hyperlipidemia: Secondary | ICD-10-CM | POA: Diagnosis not present

## 2019-06-13 DIAGNOSIS — I4819 Other persistent atrial fibrillation: Secondary | ICD-10-CM | POA: Diagnosis not present

## 2019-07-11 ENCOUNTER — Other Ambulatory Visit: Payer: Self-pay | Admitting: Family Medicine

## 2019-07-11 DIAGNOSIS — F5101 Primary insomnia: Secondary | ICD-10-CM

## 2019-07-11 NOTE — Telephone Encounter (Signed)
Requested medication (s) are due for refill today: { yes  Requested medication (s) are on the active medication list:yes  Last refill: 11/21/2018   #30  5 refill  Future visit scheduled  yes  02/27/2020  Notes to clinic:not delegated  Requested Prescriptions  Pending Prescriptions Disp Refills   zolpidem (AMBIEN) 5 MG tablet [Pharmacy Med Name: ZOLPIDEM TARTRATE 5 MG TAB] 30 tablet     Sig: TAKE ONE TABLET BY MOUTH AT BEDTIME AS NEEDED FOR SLEEP      Not Delegated - Psychiatry:  Anxiolytics/Hypnotics Failed - 07/11/2019  9:46 AM      Failed - This refill cannot be delegated      Failed - Urine Drug Screen completed in last 360 days.      Passed - Valid encounter within last 6 months    Recent Outpatient Visits           4 months ago Back pain, unspecified back location, unspecified back pain laterality, unspecified chronicity   [facility] [name], MD   8 months ago Chest pain, unspecified type   [facility] [name], MD   8 months ago Coronary artery disease involving coronary bypass graft of native heart with angina pectoris [facility])   [facility] [name], MD   10 months ago Chest pain, unspecified type   [facility] [name], MD   10 months ago S/P CABG x [place] [name], MD

## 2019-07-22 ENCOUNTER — Other Ambulatory Visit: Payer: Self-pay | Admitting: Family Medicine

## 2019-07-25 DIAGNOSIS — L820 Inflamed seborrheic keratosis: Secondary | ICD-10-CM | POA: Diagnosis not present

## 2019-07-25 DIAGNOSIS — L700 Acne vulgaris: Secondary | ICD-10-CM | POA: Diagnosis not present

## 2019-07-25 DIAGNOSIS — L578 Other skin changes due to chronic exposure to nonionizing radiation: Secondary | ICD-10-CM | POA: Diagnosis not present

## 2019-07-25 DIAGNOSIS — C4402 Squamous cell carcinoma of skin of lip: Secondary | ICD-10-CM | POA: Diagnosis not present

## 2019-07-25 DIAGNOSIS — L570 Actinic keratosis: Secondary | ICD-10-CM | POA: Diagnosis not present

## 2019-07-25 DIAGNOSIS — D485 Neoplasm of uncertain behavior of skin: Secondary | ICD-10-CM | POA: Diagnosis not present

## 2019-07-25 DIAGNOSIS — C4492 Squamous cell carcinoma of skin, unspecified: Secondary | ICD-10-CM

## 2019-07-25 DIAGNOSIS — Z85828 Personal history of other malignant neoplasm of skin: Secondary | ICD-10-CM | POA: Diagnosis not present

## 2019-07-25 HISTORY — DX: Squamous cell carcinoma of skin, unspecified: C44.92

## 2019-09-04 ENCOUNTER — Other Ambulatory Visit: Payer: Self-pay | Admitting: Family Medicine

## 2019-09-04 DIAGNOSIS — F411 Generalized anxiety disorder: Secondary | ICD-10-CM

## 2019-09-04 NOTE — Telephone Encounter (Signed)
Pt overdue for office appt to follow up. Please tell pt this is a courtesy refill for 30 days. No further refills until f/u. Requested Prescriptions  Pending Prescriptions Disp Refills  . sertraline (ZOLOFT) 25 MG tablet [Pharmacy Med Name: SERTRALINE HCL 25 MG TAB] 30 tablet 0    Sig: TAKE 1 TABLET BY MOUTH DAILY     Psychiatry:  Antidepressants - SSRI Failed - 09/04/2019  8:26 AM      Failed - Valid encounter within last 6 months    Recent Outpatient Visits          6 months ago Back pain, unspecified back location, unspecified back pain laterality, unspecified chronicity   [facility] [name], MD   10 months ago Chest pain, unspecified type   [facility] [name], MD   10 months ago Coronary artery disease involving coronary bypass graft of native heart with angina pectoris [facility])   [facility] [name], MD   1 year ago Chest pain, unspecified type   [facility] [name], MD   1 year ago S/P CABG x [place]., MD      Future Appointments            Tomorrow Ralene Bathe, MD [place] - Completed PHQ-2 or PHQ-9 in the last 360 days.

## 2019-09-05 ENCOUNTER — Other Ambulatory Visit: Payer: Self-pay

## 2019-09-05 ENCOUNTER — Encounter: Payer: Self-pay | Admitting: Dermatology

## 2019-09-05 ENCOUNTER — Ambulatory Visit (INDEPENDENT_AMBULATORY_CARE_PROVIDER_SITE_OTHER): Payer: Medicare Other | Admitting: Dermatology

## 2019-09-05 DIAGNOSIS — C4402 Squamous cell carcinoma of skin of lip: Secondary | ICD-10-CM

## 2019-09-05 DIAGNOSIS — L905 Scar conditions and fibrosis of skin: Secondary | ICD-10-CM | POA: Diagnosis not present

## 2019-09-05 DIAGNOSIS — L570 Actinic keratosis: Secondary | ICD-10-CM | POA: Diagnosis not present

## 2019-09-05 DIAGNOSIS — C4492 Squamous cell carcinoma of skin, unspecified: Secondary | ICD-10-CM

## 2019-09-05 NOTE — Progress Notes (Signed)
   Follow-Up Visit   Subjective  Stacey [name] is a 79 y.o. 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 Recheck R lateral upper lip vermillion Ak treated with LN2 07-25-2019. Pt with a previous Hx of SCC at the L upper lip treated with Mohs surgery Dr Lacinda Axon  The following portions of the chart were reviewed this encounter and updated as appropriate: Tobacco  Allergies  Meds  Problems  Med Hx  Surg Hx  Fam Hx      {Review of Systems: "No other skin or systemic complaints."  Objective  Well appearing patient in no apparent distress; mood and affect are within normal limits.  A focused examination was performed including face,lip. Relevant physical exam findings are noted in the Assessment and Plan.  Objective  R lateral upper lip vermillion border: Clear no sign of recurrence  Objective  L upper lateral lip above vermillion border: Well healed scar with no evidence of recurrence, no lymphadenopathy.   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 Observe for recurrence. Call clinic for new or changing lesions.  Recommend regular skin exams, daily broad-spectrum spf 30+ sunscreen use, and photoprotection.    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 Biopsy proven.  Since adjacent to scar of previous SCC treated with MOHS in past at [facility], Dr Lacinda Axon, may be recurrent SCC. Discussed treatment options. Mohs seems best option. Recommend Mohs surgery with Dr Lacinda Axon due to hx of [facility] in this area treated by Dr Lacinda Axon in the past  Return in about 3 months (around 12/05/2019) for Hx of SCC, AKs.   Documentation: I have reviewed the above  documentation for accuracy and completeness, and I agree with the above.  Sarina Ser, MD

## 2019-09-07 ENCOUNTER — Other Ambulatory Visit: Payer: Self-pay

## 2019-09-07 DIAGNOSIS — C4402 Squamous cell carcinoma of skin of lip: Secondary | ICD-10-CM

## 2019-09-09 ENCOUNTER — Other Ambulatory Visit: Payer: Self-pay | Admitting: Family Medicine

## 2019-09-09 NOTE — Telephone Encounter (Signed)
Requested medication (s) are due for refill today: yes  Requested medication (s) are on the active medication list: yes  Last refill:  08/22/18  Future visit scheduled: no  Notes to clinic:  Prescription expired 10/27/18   Requested Prescriptions  Pending Prescriptions Disp Refills   metoprolol tartrate (LOPRESSOR) 25 MG tablet [Pharmacy Med Name: METOPROLOL TARTRATE 25 MG TAB] 270 tablet 4    Sig: ONE AND ONE-HALF TABLETS BY MOUTH TWICE DAILY      Cardiovascular:  Beta Blockers Failed - 09/09/2019  9:21 AM      Failed - Valid encounter within last 6 months    Recent Outpatient Visits           6 months ago Back pain, unspecified back location, unspecified back pain laterality, unspecified chronicity   [facility] [name], MD   10 months ago Chest pain, unspecified type   [facility] [name], MD   10 months ago Coronary artery disease involving coronary bypass graft of native heart with angina pectoris [facility])   [facility] [name], MD   1 year ago Chest pain, unspecified type   [facility] [name], MD   1 year ago S/P CABG x [place] [name], MD       Future Appointments             In 5 months Ralene Bathe, MD [place] BP in normal range    BP Readings from Last 1 Encounters:  02/21/19 132/72          Passed - Last Heart Rate in normal range    Pulse Readings from Last 1 Encounters:  02/21/19 85

## 2019-09-11 ENCOUNTER — Telehealth: Payer: Self-pay

## 2019-09-11 NOTE — Telephone Encounter (Signed)
Copied from [place] [id number]. Topic: General - Call Back - No Documentation >> Sep 11, 2019 11:33 AM Erick Blinks wrote: Best contact: [phone] Pt's husbands called requesting a call back from PCP regarding medication questions. Please advise

## 2019-09-12 NOTE — Telephone Encounter (Signed)
LMOVM for Stacey [name] to return call. Would like more details on what questions he has concerning pt's meds?

## 2019-09-12 NOTE — Telephone Encounter (Signed)
Mr. Stacey [name] reports that he is having trouble cutting Metoprolol 25mg  in half. He wants to know if she can take 2 tablets in the morning and 1 tablet at night time. So that he wont have to cut tablets in half. He reports that Mrs. [name]'s blood pressure is under control. Please advise.

## 2019-09-16 ENCOUNTER — Other Ambulatory Visit: Payer: Self-pay | Admitting: Family Medicine

## 2019-09-16 DIAGNOSIS — F3342 Major depressive disorder, recurrent, in full remission: Secondary | ICD-10-CM

## 2019-09-20 NOTE — Telephone Encounter (Signed)
Stacey [name] called back returning call from office. States his phone isnt the greatest right now so if office isnt able to get in contact with him to please leave detailed message. Please advise.

## 2019-09-20 NOTE — Telephone Encounter (Signed)
LMOVM for Stacey [name] to return call.

## 2019-09-20 NOTE — Telephone Encounter (Signed)
Is okay to try this.

## 2019-09-20 NOTE — Telephone Encounter (Signed)
Stacey [name] was advised.

## 2019-10-02 DIAGNOSIS — C4402 Squamous cell carcinoma of skin of lip: Secondary | ICD-10-CM | POA: Diagnosis not present

## 2019-10-02 DIAGNOSIS — Z85828 Personal history of other malignant neoplasm of skin: Secondary | ICD-10-CM | POA: Insufficient documentation

## 2019-10-11 DIAGNOSIS — E782 Mixed hyperlipidemia: Secondary | ICD-10-CM | POA: Diagnosis not present

## 2019-10-11 DIAGNOSIS — I25810 Atherosclerosis of coronary artery bypass graft(s) without angina pectoris: Secondary | ICD-10-CM | POA: Diagnosis not present

## 2019-10-11 DIAGNOSIS — I10 Essential (primary) hypertension: Secondary | ICD-10-CM | POA: Diagnosis not present

## 2019-10-11 DIAGNOSIS — I340 Nonrheumatic mitral (valve) insufficiency: Secondary | ICD-10-CM | POA: Diagnosis not present

## 2019-10-11 DIAGNOSIS — I4819 Other persistent atrial fibrillation: Secondary | ICD-10-CM | POA: Diagnosis not present

## 2019-10-12 ENCOUNTER — Other Ambulatory Visit: Payer: Self-pay | Admitting: Family Medicine

## 2019-10-12 DIAGNOSIS — F5101 Primary insomnia: Secondary | ICD-10-CM

## 2019-11-25 ENCOUNTER — Other Ambulatory Visit: Payer: Self-pay | Admitting: Family Medicine

## 2019-11-25 DIAGNOSIS — F411 Generalized anxiety disorder: Secondary | ICD-10-CM

## 2019-11-29 NOTE — Progress Notes (Deleted)
Established patient visit   Patient: Stacey [name]   DOB: 1941/03/31   79 y.o. Female  MRN: [id number] Visit Date: 11/30/2019  Today's healthcare provider: Trinna Post, PA-C   No chief complaint on file.  Subjective    HPI  Rash Patient presents today with a rash on both legs.  {Show patient history (optional):23778::" "}   Medications: Outpatient Medications Prior to Visit  Medication Sig  . acetaminophen (TYLENOL) 325 MG tablet Take 2 tablets (650 mg total) by mouth every 6 (six) hours as needed for mild pain.  Marland Kitchen albuterol (VENTOLIN HFA) 108 (90 Base) MCG/ACT inhaler INHALE 2 PUFFS BY MOUTH EVERY 4 TO 6 HOURS AS NEEDED FOR SHORTNESS OF BREATH  . amLODipine (NORVASC) 5 MG tablet Take 1 tablet (5 mg total) daily by mouth.  Marland Kitchen apixaban (ELIQUIS) 5 MG TABS tablet Take 1 tablet (5 mg total) by mouth 2 (two) times daily.  Marland Kitchen ascorbic acid (VITAMIN C) 500 MG tablet Take 500 mg by mouth daily.  Marland Kitchen aspirin 81 MG tablet Take 1 tablet (81 mg total) by mouth daily.  Marland Kitchen buPROPion (WELLBUTRIN XL) 300 MG 24 hr tablet TAKE ONE TABLET BY MOUTH EVERY DAY  . Cholecalciferol (VITAMIN D) 2000 units tablet Take 2,000 Units by mouth daily.  . Cyanocobalamin (B-12) 500 MCG TABS Take 500 mcg by mouth daily.   . diazepam (VALIUM) 5 MG tablet Take 1 tablet (5 mg total) by mouth every 12 (twelve) hours as needed for anxiety. (Patient taking differently: Take 5 mg by mouth every 12 (twelve) hours as needed for anxiety. Takes 1/2 tablet)  . ELDERBERRY PO Take by mouth daily. Unsure dose / combination vitamin  . EPINEPHrine (EPIPEN 2-PAK) 0.3 mg/0.3 mL IJ SOAJ injection INJECT AS DIRECTED FOR SEVERE ALLERGIC REACTION (Patient taking differently: Inject 0.3 mg into the muscle as needed (for severe allergic reaction). )  . fluocinonide gel (LIDEX) 9.21 % Apply 1 application topically as needed.  . Garlic 194 MG TABS Take 500 mg by mouth daily. Every other day  . hydrochlorothiazide (HYDRODIURIL) 12.5  MG tablet TAKE ONE TABLET EVERY DAY  . levothyroxine (SYNTHROID) 100 MCG tablet TAKE 1 TABLET BY MOUTH DAILY  . lisinopril (PRINIVIL,ZESTRIL) 40 MG tablet TAKE 1 TABLET BY MOUTH EVERY DAY  . metoprolol tartrate (LOPRESSOR) 25 MG tablet ONE AND ONE-HALF TABLETS BY MOUTH TWICE DAILY  . metoprolol tartrate 37.5 MG TABS Take 37.5 mg by mouth 2 (two) times daily.  . Multiple Vitamin (MULTI-VITAMINS) TABS Take 1 tablet by mouth daily.  Marland Kitchen nystatin (MYCOSTATIN) 100000 UNIT/ML suspension Take 100,000 Units by mouth 3 (three) times daily as needed. For mouth ulcers.  Marland Kitchen oxymetazoline (AFRIN) 0.05 % nasal spray Place 1 spray into both nostrils as needed for congestion.  . predniSONE (DELTASONE) 10 MG tablet Take 2 tablets by mouth daily X 1 week, then 1 tablet by mouth daily.  . rosuvastatin (CRESTOR) 20 MG tablet Take 1 tablet (20 mg total) by mouth daily.  . sertraline (ZOLOFT) 25 MG tablet TAKE 1 TABLET BY MOUTH DAILY  . tacrolimus (PROTOPIC) 0.1 % ointment As needed for rosacea flare ups  . traMADol (ULTRAM) 50 MG tablet Take 1 tablet (50 mg total) by mouth every 6 (six) hours as needed for moderate pain.  Marland Kitchen zolpidem (AMBIEN) 5 MG tablet TAKE ONE TABLET BY MOUTH AT BEDTIME AS NEEDED FOR SLEEP   No facility-administered medications prior to visit.    Review of Systems  {Heme  Chem  Endocrine  Serology  Results Review (optional):23779::" "}  Objective    There were no vitals taken for this visit. {Show previous vital signs (optional):23777::" "}  Physical Exam  ***  No results found for any visits on 11/30/19.  Assessment & Plan     ***  No follow-ups on file.      {provider attestation***:1}   Paulene Floor  [facility] [phone] (phone) [phone] (fax)  [place]

## 2019-11-30 ENCOUNTER — Ambulatory Visit: Payer: Medicare Other | Admitting: Physician Assistant

## 2019-11-30 ENCOUNTER — Other Ambulatory Visit: Payer: Self-pay

## 2019-11-30 ENCOUNTER — Ambulatory Visit (INDEPENDENT_AMBULATORY_CARE_PROVIDER_SITE_OTHER): Payer: Medicare Other | Admitting: Family Medicine

## 2019-11-30 ENCOUNTER — Encounter: Payer: Self-pay | Admitting: Family Medicine

## 2019-11-30 VITALS — BP 121/67 | HR 67 | Temp 97.1°F | Ht 62.0 in | Wt 188.8 lb

## 2019-11-30 DIAGNOSIS — L03115 Cellulitis of right lower limb: Secondary | ICD-10-CM | POA: Diagnosis not present

## 2019-11-30 DIAGNOSIS — I25709 Atherosclerosis of coronary artery bypass graft(s), unspecified, with unspecified angina pectoris: Secondary | ICD-10-CM

## 2019-11-30 DIAGNOSIS — Z6834 Body mass index (BMI) 34.0-34.9, adult: Secondary | ICD-10-CM

## 2019-11-30 DIAGNOSIS — E6609 Other obesity due to excess calories: Secondary | ICD-10-CM

## 2019-11-30 DIAGNOSIS — I480 Paroxysmal atrial fibrillation: Secondary | ICD-10-CM | POA: Diagnosis not present

## 2019-11-30 DIAGNOSIS — F324 Major depressive disorder, single episode, in partial remission: Secondary | ICD-10-CM

## 2019-11-30 MED ORDER — CLINDAMYCIN HCL 150 MG PO CAPS: 150.0000 mg | ORAL_CAPSULE | Freq: Three times a day (TID) | ORAL | 0 refills | Status: DC

## 2019-11-30 NOTE — Assessment & Plan Note (Signed)
Chronic anticoagulation with Eliquis

## 2019-11-30 NOTE — Progress Notes (Signed)
Established patient visit   Patient: Stacey [name]   DOB: 1941-04-03   79 y.o. Female  MRN: [id number] Visit Date: 11/30/2019  Today's healthcare provider: Wilhemena Durie, MD   Chief Complaint  Patient presents with  . Rash   Subjective    Rash This is a new problem. The current episode started 1 to 4 weeks ago. The problem has been gradually worsening since onset. The affected locations include the left lower leg and right lower leg. The rash is characterized by itchiness, redness, swelling and burning. It is unknown if there was an exposure to a precipitant. Associated symptoms include congestion, a fever and rhinorrhea. Pertinent negatives include no cough, diarrhea, eye pain, facial edema, fatigue, joint pain, shortness of breath, sore throat or vomiting. Past treatments include antihistamine. The treatment provided no relief.    Left leg 14 inches, right leg 15 1/2 inches.  This is a chronic issue.  The swelling has been occurring since her CABG with venous harvesting.  Right leg swelling has been present since CABG and vein graph.   She had Covid but is declining Covid vaccine.     Medications: Outpatient Medications Prior to Visit  Medication Sig  . acetaminophen (TYLENOL) 325 MG tablet Take 2 tablets (650 mg total) by mouth every 6 (six) hours as needed for mild pain.  Marland Kitchen albuterol (VENTOLIN HFA) 108 (90 Base) MCG/ACT inhaler INHALE 2 PUFFS BY MOUTH EVERY 4 TO 6 HOURS AS NEEDED FOR SHORTNESS OF BREATH  . amLODipine (NORVASC) 5 MG tablet Take 1 tablet (5 mg total) daily by mouth.  Marland Kitchen apixaban (ELIQUIS) 5 MG TABS tablet Take 1 tablet (5 mg total) by mouth 2 (two) times daily.  Marland Kitchen ascorbic acid (VITAMIN C) 500 MG tablet Take 500 mg by mouth daily.  Marland Kitchen aspirin 81 MG tablet Take 1 tablet (81 mg total) by mouth daily.  Marland Kitchen buPROPion (WELLBUTRIN XL) 300 MG 24 hr tablet TAKE ONE TABLET BY MOUTH EVERY DAY  . Cholecalciferol (VITAMIN D) 2000 units tablet Take 2,000 Units by  mouth daily.  . Cyanocobalamin (B-12) 500 MCG TABS Take 500 mcg by mouth daily.   . diazepam (VALIUM) 5 MG tablet Take 1 tablet (5 mg total) by mouth every 12 (twelve) hours as needed for anxiety. (Patient taking differently: Take 5 mg by mouth every 12 (twelve) hours as needed for anxiety. Takes 1/2 tablet)  . ELDERBERRY PO Take by mouth daily. Unsure dose / combination vitamin  . EPINEPHrine (EPIPEN 2-PAK) 0.3 mg/0.3 mL IJ SOAJ injection INJECT AS DIRECTED FOR SEVERE ALLERGIC REACTION (Patient taking differently: Inject 0.3 mg into the muscle as needed (for severe allergic reaction). )  . fluocinonide gel (LIDEX) 7.86 % Apply 1 application topically as needed.  . hydrochlorothiazide (HYDRODIURIL) 12.5 MG tablet TAKE ONE TABLET EVERY DAY  . levothyroxine (SYNTHROID) 100 MCG tablet TAKE 1 TABLET BY MOUTH DAILY  . metoprolol tartrate (LOPRESSOR) 25 MG tablet ONE AND ONE-HALF TABLETS BY MOUTH TWICE DAILY  . metoprolol tartrate 37.5 MG TABS Take 37.5 mg by mouth 2 (two) times daily.  . Multiple Vitamin (MULTI-VITAMINS) TABS Take 1 tablet by mouth daily.  Marland Kitchen nystatin (MYCOSTATIN) 100000 UNIT/ML suspension Take 100,000 Units by mouth 3 (three) times daily as needed. For mouth ulcers.  Marland Kitchen oxymetazoline (AFRIN) 0.05 % nasal spray Place 1 spray into both nostrils as needed for congestion.  . rosuvastatin (CRESTOR) 20 MG tablet Take 1 tablet (20 mg total) by mouth daily.  Marland Kitchen  sertraline (ZOLOFT) 25 MG tablet TAKE 1 TABLET BY MOUTH DAILY  . tacrolimus (PROTOPIC) 0.1 % ointment As needed for rosacea flare ups  . traMADol (ULTRAM) 50 MG tablet Take 1 tablet (50 mg total) by mouth every 6 (six) hours as needed for moderate pain.  Marland Kitchen zolpidem (AMBIEN) 5 MG tablet TAKE ONE TABLET BY MOUTH AT BEDTIME AS NEEDED FOR SLEEP  . Garlic 235 MG TABS Take 500 mg by mouth daily. Every other day (Patient not taking: Reported on 11/30/2019)  . lisinopril (PRINIVIL,ZESTRIL) 40 MG tablet TAKE 1 TABLET BY MOUTH EVERY DAY (Patient not  taking: Reported on 11/30/2019)  . [DISCONTINUED] predniSONE (DELTASONE) 10 MG tablet Take 2 tablets by mouth daily X 1 week, then 1 tablet by mouth daily. (Patient not taking: Reported on 11/30/2019)   No facility-administered medications prior to visit.    Review of Systems  Constitutional: Positive for fever. Negative for fatigue.  HENT: Positive for congestion and rhinorrhea. Negative for sore throat.   Eyes: Negative for pain.  Respiratory: Negative for cough and shortness of breath.   Gastrointestinal: Negative for diarrhea and vomiting.  Musculoskeletal: Negative for joint pain.  Skin: Positive for rash.       Objective    BP 121/67 (BP Location: Right Arm, Patient Position: Sitting, Cuff Size: Normal)   Pulse 67   Temp (!) 97.1 F (36.2 C) (Temporal)   Ht 5\' 2"  (1.575 m)   Wt 188 lb 12.8 oz (85.6 kg)   BMI 34.53 kg/m  BP Readings from Last 3 Encounters:  11/30/19 121/67  02/21/19 132/72  02/21/19 132/72   Wt Readings from Last 3 Encounters:  11/30/19 188 lb 12.8 oz (85.6 kg)  02/21/19 167 lb (75.8 kg)  02/21/19 167 lb (75.8 kg)      Physical Exam Vitals reviewed.  Constitutional:      Appearance: Normal appearance.  HENT:     Head: Normocephalic and atraumatic.     Right Ear: External ear normal.     Left Ear: External ear normal.  Eyes:     General: No scleral icterus.    Conjunctiva/sclera: Conjunctivae normal.  Cardiovascular:     Rate and Rhythm: Normal rate and regular rhythm.     Pulses: Normal pulses.     Heart sounds: Normal heart sounds.  Pulmonary:     Effort: Pulmonary effort is normal.     Breath sounds: Normal breath sounds.  Musculoskeletal:     Right lower leg: Edema present.     Left lower leg: No edema.  Skin:    General: Skin is warm and dry.  Neurological:     General: No focal deficit present.     Mental Status: She is alert and oriented to person, place, and time.  Psychiatric:        Mood and Affect: Mood normal.         Behavior: Behavior normal.        Thought Content: Thought content normal.        Judgment: Judgment normal.       No results found for any visits on 11/30/19.  Assessment & Plan     Problem List Items Addressed This Visit      Other   Cellulitis of right lower extremity - Primary    Started on Clindamycin 150 MG Follow up in Approximately 1-3 weeks       Relevant Medications   clindamycin (CLEOCIN) 150 MG capsule     Problem List  Items Addressed This Visit      Cardiovascular and Mediastinum   PAF (paroxysmal atrial fibrillation) (HCC)    Chronic anticoagulation with Eliquis      Coronary artery disease involving coronary bypass graft of native heart     Other   Clinical depression   Cellulitis of right lower extremity - Primary    Started on Clindamycin 150 MG Follow up in Approximately 1-3 weeks       Relevant Medications   clindamycin (CLEOCIN) 150 MG capsule    Other Visit Diagnoses    Class 1 obesity due to excess calories with serious comorbidity and body mass index (BMI) of 34.0 to 34.9 in adult        Return to clinic 2 to 3 weeks   Return in about 3 weeks (around 12/21/2019).      I, Wilhemena Durie, MD, have reviewed all documentation for this visit. The documentation on 11/30/19 for the exam, diagnosis, procedures, and orders are all accurate and complete.    Richard Cranford Mon, MD  [facility] [phone] (phone) [phone] (fax)  [place]

## 2019-11-30 NOTE — Assessment & Plan Note (Signed)
Started on Clindamycin 150 MG Follow up in Approximately 1-3 weeks

## 2019-12-05 ENCOUNTER — Other Ambulatory Visit: Payer: Self-pay | Admitting: Family Medicine

## 2019-12-05 DIAGNOSIS — L03115 Cellulitis of right lower limb: Secondary | ICD-10-CM

## 2019-12-05 MED ORDER — CLINDAMYCIN HCL 150 MG PO CAPS: 150.0000 mg | ORAL_CAPSULE | Freq: Three times a day (TID) | ORAL | 0 refills | Status: DC

## 2019-12-05 NOTE — Telephone Encounter (Signed)
Medication Refill - Medication: clindamycin (CLEOCIN) 150 MG capsule  Pt still has the itching on her leg/ its better but hasnt cleared / please advise   Has the patient contacted their pharmacy? No. (Agent: If no, request that the patient contact the pharmacy for the refill.) (Agent: If yes, when and what did the pharmacy advise?)  Preferred Pharmacy (with phone number or street name): [place], [place] - [employer]  [address] [place], [place] [address]  Phone:  [phone] Fax:  [phone]   Agent: Please be advised that RX refills may take up to 3 business days. We ask that you follow-up with your pharmacy.

## 2019-12-05 NOTE — Telephone Encounter (Signed)
Okay to refill once.

## 2019-12-06 ENCOUNTER — Other Ambulatory Visit: Payer: Self-pay | Admitting: Family Medicine

## 2019-12-06 DIAGNOSIS — L03115 Cellulitis of right lower limb: Secondary | ICD-10-CM

## 2019-12-06 NOTE — Telephone Encounter (Signed)
Requested medication (s) are due for refill today:No  Requested medication (s) are on the active medication list  yes  Last refill: 12/05/19  #21  0 refills  Future visit scheduled: yes  Notes to clinic:  medication off protocol Was ordered yesterday     Requested Prescriptions  Pending Prescriptions Disp Refills   clindamycin (CLEOCIN) 150 MG capsule [Pharmacy Med Name: CLINDAMYCIN HCL 150 MG CAP] 21 capsule 0    Sig: TAKE 1 CAPSULE 3 TIMES DAILY UNTIL FINISHED      Off-Protocol Failed - 12/06/2019 11:40 AM      Failed - Medication not assigned to a protocol, review manually.      Passed - Valid encounter within last 12 months    Recent Outpatient Visits           6 days ago Cellulitis of right lower extremity   [facility] [name], MD   9 months ago Back pain, unspecified back location, unspecified back pain laterality, unspecified chronicity   [facility] [name], MD   1 year ago Chest pain, unspecified type   [facility] [name], MD   1 year ago Coronary artery disease involving coronary bypass graft of native heart with angina pectoris [facility])   [facility] [name], MD   1 year ago Chest pain, unspecified type   [facility] [name], MD       Future Appointments             In 3 weeks [name], MD [facility] [place]   In 3 months Ralene Bathe, MD [place]

## 2019-12-22 ENCOUNTER — Other Ambulatory Visit: Payer: Self-pay | Admitting: Family Medicine

## 2019-12-22 DIAGNOSIS — L03115 Cellulitis of right lower limb: Secondary | ICD-10-CM

## 2019-12-22 MED ORDER — DOXYCYCLINE HYCLATE 100 MG PO TABS: 100.0000 mg | ORAL_TABLET | Freq: Two times a day (BID) | ORAL | 1 refills | Status: DC

## 2019-12-23 ENCOUNTER — Other Ambulatory Visit: Payer: Self-pay | Admitting: Family Medicine

## 2019-12-23 DIAGNOSIS — F411 Generalized anxiety disorder: Secondary | ICD-10-CM

## 2019-12-23 NOTE — Telephone Encounter (Signed)
Requested Prescriptions  Pending Prescriptions Disp Refills   sertraline (ZOLOFT) 25 MG tablet [Pharmacy Med Name: SERTRALINE HCL 25 MG TAB] 30 tablet 0    Sig: TAKE 1 TABLET BY MOUTH DAILY     Psychiatry:  Antidepressants - SSRI Passed - 12/23/2019  9:07 AM      Passed - Completed PHQ-2 or PHQ-9 in the last 360 days.      Passed - Valid encounter within last 6 months    Recent Outpatient Visits          3 weeks ago Cellulitis of right lower extremity   [facility] [name], MD   10 months ago Back pain, unspecified back location, unspecified back pain laterality, unspecified chronicity   [facility] [name], MD   1 year ago Chest pain, unspecified type   [facility] [name], MD   1 year ago Coronary artery disease involving coronary bypass graft of native heart with angina pectoris [facility])   [facility] [name], MD   1 year ago Chest pain, unspecified type   [facility] [name], MD      Future Appointments            In 3 days [name], MD [facility] [place]   In 1 week [name], MD [facility], [place]   In 2 months Ralene Bathe, MD [place]

## 2019-12-25 NOTE — Progress Notes (Signed)
Stacey [name],acting as a scribe for Wilhemena Durie, MD.,have documented all relevant documentation on the behalf of Wilhemena Durie, MD,as directed by  Wilhemena Durie, MD while in the presence of Wilhemena Durie, MD.  Established patient visit   Patient: Stacey [name]   DOB: [birth year]   79 y.o. Female  MRN: [id number] Visit Date: 12/26/2019  Today's healthcare provider: Wilhemena Durie, MD   Chief Complaint  Patient presents with  . Cellulitis   Subjective    HPI   Patient presents today in office for a follow up on Cellulitis, patient says that it is better but it is still comes up in the afternoon and is swollen. Patient believes that she is supposed to have lab work today. 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  She had vein graft of the right leg and since that time she has had edema/swelling in the right leg, especially later in the day.  There is no erythema or induration in the leg today.  There is no calf pain.  At the end of the day her foot is tender as it is tight in her shoe. Cellulitis of right lower extremity From 11/30/2019-started Clindamycin 150 mg. Advised to follow up in 1-3 weeks.       Medications: Outpatient Medications Prior to Visit  Medication Sig  . acetaminophen (TYLENOL) 325 MG tablet Take 2 tablets (650 mg total) by mouth every 6 (six) hours as needed for mild pain.  Marland Kitchen albuterol (VENTOLIN HFA) 108 (90 Base) MCG/ACT inhaler INHALE 2 PUFFS BY MOUTH EVERY 4 TO 6 HOURS AS NEEDED FOR SHORTNESS OF BREATH  . amLODipine (NORVASC) 5 MG tablet Take 1 tablet (5 mg total) daily by mouth.  Marland Kitchen apixaban (ELIQUIS) 5 MG TABS tablet Take 1 tablet (5 mg total) by mouth 2 (two) times daily.  Marland Kitchen ascorbic acid (VITAMIN C) 500 MG tablet Take 500 mg by mouth daily.  Marland Kitchen aspirin 81 MG tablet Take 1 tablet (81 mg total) by mouth daily.  Marland Kitchen buPROPion (WELLBUTRIN XL) 300 MG 24 hr tablet TAKE ONE TABLET BY MOUTH EVERY DAY  . Cholecalciferol  (VITAMIN D) 2000 units tablet Take 2,000 Units by mouth daily.  . clindamycin (CLEOCIN) 150 MG capsule TAKE 1 CAPSULE 3 TIMES DAILY UNTIL FINISHED  . Cyanocobalamin (B-12) 500 MCG TABS Take 500 mcg by mouth daily.   . diazepam (VALIUM) 5 MG tablet Take 1 tablet (5 mg total) by mouth every 12 (twelve) hours as needed for anxiety. (Patient taking differently: Take 5 mg by mouth every 12 (twelve) hours as needed for anxiety. Takes 1/2 tablet)  . doxycycline (VIBRA-TABS) 100 MG tablet Take 1 tablet (100 mg total) by mouth 2 (two) times daily.  Marland Kitchen ELDERBERRY PO Take by mouth daily. Unsure dose / combination vitamin  . EPINEPHrine (EPIPEN 2-PAK) 0.3 mg/0.3 mL IJ SOAJ injection INJECT AS DIRECTED FOR SEVERE ALLERGIC REACTION (Patient taking differently: Inject 0.3 mg into the muscle as needed (for severe allergic reaction). )  . fluocinonide gel (LIDEX) 7.61 % Apply 1 application topically as needed.  . hydrochlorothiazide (HYDRODIURIL) 12.5 MG tablet TAKE ONE TABLET EVERY DAY  . levothyroxine (SYNTHROID) 100 MCG tablet TAKE 1 TABLET BY MOUTH DAILY  . metoprolol tartrate (LOPRESSOR) 25 MG tablet ONE AND ONE-HALF TABLETS BY MOUTH TWICE DAILY  . metoprolol tartrate 37.5 MG TABS Take 37.5 mg by mouth 2 (two) times daily.  . Multiple Vitamin (MULTI-VITAMINS) TABS Take 1 tablet by  mouth daily.  Marland Kitchen nystatin (MYCOSTATIN) 100000 UNIT/ML suspension Take 100,000 Units by mouth 3 (three) times daily as needed. For mouth ulcers.  Marland Kitchen oxymetazoline (AFRIN) 0.05 % nasal spray Place 1 spray into both nostrils as needed for congestion.  . rosuvastatin (CRESTOR) 20 MG tablet Take 1 tablet (20 mg total) by mouth daily.  . sertraline (ZOLOFT) 25 MG tablet TAKE 1 TABLET BY MOUTH DAILY  . tacrolimus (PROTOPIC) 0.1 % ointment As needed for rosacea flare ups  . traMADol (ULTRAM) 50 MG tablet Take 1 tablet (50 mg total) by mouth every 6 (six) hours as needed for moderate pain.  Marland Kitchen zolpidem (AMBIEN) 5 MG tablet TAKE ONE TABLET BY  MOUTH AT BEDTIME AS NEEDED FOR SLEEP  . Garlic 154 MG TABS Take 500 mg by mouth daily. Every other day (Patient not taking: Reported on 11/30/2019)  . lisinopril (PRINIVIL,ZESTRIL) 40 MG tablet TAKE 1 TABLET BY MOUTH EVERY DAY (Patient not taking: Reported on 11/30/2019)   No facility-administered medications prior to visit.    Review of Systems  Constitutional: Negative for appetite change, chills, fatigue and fever.  Respiratory: Negative for chest tightness and shortness of breath.   Cardiovascular: Negative for chest pain and palpitations.  Gastrointestinal: Negative for abdominal pain, nausea and vomiting.  Neurological: Negative for dizziness and weakness.       Objective    BP 121/78 (BP Location: Left Arm, Patient Position: Sitting, Cuff Size: Normal)   Pulse 76   Temp 98.1 F (36.7 C) (Temporal)   Wt 189 lb 12.8 oz (86.1 kg)   BMI 34.71 kg/m  BP Readings from Last 3 Encounters:  12/26/19 121/78  11/30/19 121/67  02/21/19 132/72   Wt Readings from Last 3 Encounters:  12/26/19 189 lb 12.8 oz (86.1 kg)  11/30/19 188 lb 12.8 oz (85.6 kg)  02/21/19 167 lb (75.8 kg)      Physical Exam Vitals reviewed.  Constitutional:      Appearance: Normal appearance.  HENT:     Head: Normocephalic and atraumatic.     Right Ear: External ear normal.     Left Ear: External ear normal.  Eyes:     General: No scleral icterus.    Conjunctiva/sclera: Conjunctivae normal.  Cardiovascular:     Rate and Rhythm: Normal rate and regular rhythm.     Pulses: Normal pulses.     Heart sounds: Normal heart sounds.  Pulmonary:     Effort: Pulmonary effort is normal.     Breath sounds: Normal breath sounds.  Musculoskeletal:     Right lower leg: Edema present.     Left lower leg: No edema.     Comments: Left leg shows trace edema.  Right leg shows 2+ edema. No cords negative Homans.  Calf is more swollen on the right than left.  Skin:    General: Skin is warm and dry.  Neurological:      General: No focal deficit present.     Mental Status: She is alert and oriented to person, place, and time.  Psychiatric:        Mood and Affect: Mood normal.        Behavior: Behavior normal.        Thought Content: Thought content normal.        Judgment: Judgment normal.       No results found for any visits on 12/26/19.  Assessment & Plan    1. Venous insufficiency of right leg I think most of this  is aging and status post veinous grafting for bypass graft.  Rule out DVT with Dopplers and refer to vascular.  Patient is not wearing support hose which I encouraged.  I do not think diuretics would prove useful. Check with vascular to make sure there is no other underlying issue. 2. Cellulitis of right lower extremity No infection today. - Ambulatory referral to Vascular Surgery - VAS [place] LOWER EXTREMITY VENOUS (DVT)  3. Hyperlipidemia LDL goal <70 On rosuvastatin 20 - CBC with Differential/Platelet - TSH - Comprehensive metabolic panel - Lipid panel  4. Essential hypertension Good control - CBC with Differential/Platelet - TSH - Comprehensive metabolic panel - Lipid panel  5. Coronary artery disease involving coronary bypass graft of native heart with angina pectoris (HCC) No angina - CBC with Differential/Platelet - TSH - Comprehensive metabolic panel - Lipid panel  6. Class 1 obesity due to excess calories with serious comorbidity and body mass index (BMI) of 34.0 to 34.9 in adult Patient has gained 20 pounds over the past year.  Might benefit from weight watchers. She has CAD hypertension hyperlipidemia related to obesity    No follow-ups on file.         Mareesa Gathright Cranford Mon, MD  [facility] [phone] (phone) [phone] (fax)  [place]

## 2019-12-26 ENCOUNTER — Ambulatory Visit (INDEPENDENT_AMBULATORY_CARE_PROVIDER_SITE_OTHER): Payer: Medicare Other | Admitting: Family Medicine

## 2019-12-26 ENCOUNTER — Encounter: Payer: Self-pay | Admitting: Family Medicine

## 2019-12-26 ENCOUNTER — Other Ambulatory Visit: Payer: Self-pay

## 2019-12-26 VITALS — BP 121/78 | HR 76 | Temp 98.1°F | Wt 189.8 lb

## 2019-12-26 DIAGNOSIS — Z6834 Body mass index (BMI) 34.0-34.9, adult: Secondary | ICD-10-CM | POA: Diagnosis not present

## 2019-12-26 DIAGNOSIS — I872 Venous insufficiency (chronic) (peripheral): Secondary | ICD-10-CM | POA: Diagnosis not present

## 2019-12-26 DIAGNOSIS — L03115 Cellulitis of right lower limb: Secondary | ICD-10-CM

## 2019-12-26 DIAGNOSIS — I25709 Atherosclerosis of coronary artery bypass graft(s), unspecified, with unspecified angina pectoris: Secondary | ICD-10-CM

## 2019-12-26 DIAGNOSIS — I10 Essential (primary) hypertension: Secondary | ICD-10-CM | POA: Diagnosis not present

## 2019-12-26 DIAGNOSIS — E785 Hyperlipidemia, unspecified: Secondary | ICD-10-CM | POA: Diagnosis not present

## 2019-12-26 DIAGNOSIS — E6609 Other obesity due to excess calories: Secondary | ICD-10-CM | POA: Diagnosis not present

## 2019-12-26 NOTE — Patient Instructions (Signed)
WEAR KNEE HIGH SUPPORT HOSE!!!

## 2019-12-27 LAB — CBC WITH DIFFERENTIAL/PLATELET
Basophils Absolute: 0.1 10*3/uL (ref 0.0–0.2)
Basos: 1 %
EOS (ABSOLUTE): 0.5 10*3/uL — ABNORMAL HIGH (ref 0.0–0.4)
Eos: 9 %
Hematocrit: 45 % (ref 34.0–46.6)
Hemoglobin: 14.5 g/dL (ref 11.1–15.9)
Immature Grans (Abs): 0 10*3/uL (ref 0.0–0.1)
Immature Granulocytes: 0 %
Lymphocytes Absolute: 1.1 10*3/uL (ref 0.7–3.1)
Lymphs: 19 %
MCH: 28.9 pg (ref 26.6–33.0)
MCHC: 32.2 g/dL (ref 31.5–35.7)
MCV: 90 fL (ref 79–97)
Monocytes Absolute: 0.6 10*3/uL (ref 0.1–0.9)
Monocytes: 10 %
Neutrophils Absolute: 3.3 10*3/uL (ref 1.4–7.0)
Neutrophils: 61 %
Platelets: 161 10*3/uL (ref 150–450)
RBC: 5.01 x10E6/uL (ref 3.77–5.28)
RDW: 12.3 % (ref 11.7–15.4)
WBC: 5.5 10*3/uL (ref 3.4–10.8)

## 2019-12-27 LAB — COMPREHENSIVE METABOLIC PANEL
ALT: 14 IU/L (ref 0–32)
AST: 19 IU/L (ref 0–40)
Albumin/Globulin Ratio: 2.4 — ABNORMAL HIGH (ref 1.2–2.2)
Albumin: 4.5 g/dL (ref 3.7–4.7)
Alkaline Phosphatase: 90 IU/L (ref 48–121)
BUN/Creatinine Ratio: 17 (ref 12–28)
BUN: 17 mg/dL (ref 8–27)
Bilirubin Total: 0.4 mg/dL (ref 0.0–1.2)
CO2: 25 mmol/L (ref 20–29)
Calcium: 9.2 mg/dL (ref 8.7–10.3)
Chloride: 97 mmol/L (ref 96–106)
Creatinine, Ser: 1.03 mg/dL — ABNORMAL HIGH (ref 0.57–1.00)
GFR calc Af Amer: 61 mL/min/{1.73_m2} (ref >59)
GFR calc non Af Amer: 53 mL/min/{1.73_m2} — ABNORMAL LOW (ref >59)
Globulin, Total: 1.9 g/dL (ref 1.5–4.5)
Glucose: 122 mg/dL — ABNORMAL HIGH (ref 65–99)
Potassium: 4.2 mmol/L (ref 3.5–5.2)
Sodium: 137 mmol/L (ref 134–144)
Total Protein: 6.4 g/dL (ref 6.0–8.5)

## 2019-12-27 LAB — LIPID PANEL
Chol/HDL Ratio: 2.6 ratio (ref 0.0–4.4)
Cholesterol, Total: 172 mg/dL (ref 100–199)
HDL: 65 mg/dL (ref >39)
LDL Chol Calc (NIH): 89 mg/dL (ref 0–99)
Triglycerides: 100 mg/dL (ref 0–149)
VLDL Cholesterol Cal: 18 mg/dL (ref 5–40)

## 2019-12-27 LAB — TSH: TSH: 3.75 u[IU]/mL (ref 0.450–4.500)

## 2020-01-01 ENCOUNTER — Ambulatory Visit: Payer: Self-pay | Admitting: Family Medicine

## 2020-01-04 ENCOUNTER — Ambulatory Visit (INDEPENDENT_AMBULATORY_CARE_PROVIDER_SITE_OTHER): Payer: Medicare Other

## 2020-01-04 ENCOUNTER — Ambulatory Visit (INDEPENDENT_AMBULATORY_CARE_PROVIDER_SITE_OTHER): Payer: Medicare Other | Admitting: Family Medicine

## 2020-01-04 ENCOUNTER — Other Ambulatory Visit: Payer: Self-pay

## 2020-01-04 DIAGNOSIS — L03115 Cellulitis of right lower limb: Secondary | ICD-10-CM

## 2020-01-04 DIAGNOSIS — I10 Essential (primary) hypertension: Secondary | ICD-10-CM | POA: Diagnosis not present

## 2020-01-04 DIAGNOSIS — R05 Cough: Secondary | ICD-10-CM | POA: Diagnosis not present

## 2020-01-04 DIAGNOSIS — J069 Acute upper respiratory infection, unspecified: Secondary | ICD-10-CM

## 2020-01-04 DIAGNOSIS — Z951 Presence of aortocoronary bypass graft: Secondary | ICD-10-CM

## 2020-01-04 DIAGNOSIS — R059 Cough, unspecified: Secondary | ICD-10-CM

## 2020-01-04 NOTE — Progress Notes (Signed)
Virtual Visit via Telephone Note  I connected with [name] on 01/04/20 at  2:40 PM EDT by telephone and verified that I am speaking with the correct person using two identifiers.  Location: Patient: Home Provider: Office   I discussed the limitations, risks, security and privacy concerns of performing an evaluation and management service by telephone and the availability of in person appointments. I also discussed with the patient that there may be a patient responsible charge related to this service. The patient expressed understanding and agreed to proceed.   History of Present Illness: Pt c/o wheezing and nonproductive cough for the past week. 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  She has had no fever.  She has not had the Covid vaccine and will not get it.  She had Covid last fall.  No known Covid exposure. She has had nonproductive cough without shortness of breath or fever.  She does have some postnasal drainage but no orthopnea or PND.  No nighttime nocturnal coughing Observations/Objective:   Assessment and Plan: 1. Cough Cough is consistent with URI with mild asthma.  No signs of bacterial infection at all. I have strongly recommended patient get a Covid PCR test.  She does not want to do this. Use albuterol 4 times a day.  Use Robitussin and push fluids. Also stop lisinopril as this may be contributing to cough also.   2. Viral URI Going through her daycare.  3. S/P CABG x 4 All risk factors treated  4. Essential hypertension Follow-up in office for blood pressure in about a month.  She checks blood pressure at home.   Follow Up Instructions:    I discussed the assessment and treatment plan with the patient. The patient was provided an opportunity to ask questions and all were answered. The patient agreed with the plan and demonstrated an understanding of the instructions.   The patient was advised to call back or  seek an in-person evaluation if the symptoms worsen or if the condition fails to improve as anticipated.  I provided 12 minutes of non-face-to-face time during this encounter.   Yoskar Murrillo Cranford Mon, MD  Virtual telephone visit    Virtual Visit via Telephone Note   This visit type was conducted due to national recommendations for restrictions regarding the COVID-19 Pandemic (e.g. social distancing) in an effort to limit this patient's exposure and mitigate transmission in our community. Due to her co-morbid illnesses, this patient is at least at moderate risk for complications without adequate follow up. This format is felt to be most appropriate for this patient at this time. The patient did not have access to video technology or had technical difficulties with video requiring transitioning to audio format only (telephone). Physical exam was limited to content and character of the telephone converstion.    Patient location:  Provider location:    Visit Date: 01/04/2020  Today's healthcare provider: Wilhemena Durie, MD   No chief complaint on file.  Subjective    HPI         Medications: Outpatient Medications Prior to Visit  Medication Sig  . acetaminophen (TYLENOL) 325 MG tablet Take 2 tablets (650 mg total) by mouth every 6 (six) hours as needed for mild pain.  Marland Kitchen albuterol (VENTOLIN HFA) 108 (90 Base) MCG/ACT inhaler INHALE 2 PUFFS BY MOUTH EVERY 4 TO 6 HOURS AS NEEDED FOR SHORTNESS OF BREATH  . amLODipine (NORVASC) 5 MG tablet Take 1 tablet (  5 mg total) daily by mouth.  Marland Kitchen apixaban (ELIQUIS) 5 MG TABS tablet Take 1 tablet (5 mg total) by mouth 2 (two) times daily.  Marland Kitchen ascorbic acid (VITAMIN C) 500 MG tablet Take 500 mg by mouth daily.  Marland Kitchen aspirin 81 MG tablet Take 1 tablet (81 mg total) by mouth daily.  Marland Kitchen buPROPion (WELLBUTRIN XL) 300 MG 24 hr tablet TAKE ONE TABLET BY MOUTH EVERY DAY  . Cholecalciferol (VITAMIN D) 2000 units tablet Take 2,000 Units by mouth daily.  .  clindamycin (CLEOCIN) 150 MG capsule TAKE 1 CAPSULE 3 TIMES DAILY UNTIL FINISHED  . Cyanocobalamin (B-12) 500 MCG TABS Take 500 mcg by mouth daily.   . diazepam (VALIUM) 5 MG tablet Take 1 tablet (5 mg total) by mouth every 12 (twelve) hours as needed for anxiety. (Patient taking differently: Take 5 mg by mouth every 12 (twelve) hours as needed for anxiety. Takes 1/2 tablet)  . doxycycline (VIBRA-TABS) 100 MG tablet Take 1 tablet (100 mg total) by mouth 2 (two) times daily.  Marland Kitchen ELDERBERRY PO Take by mouth daily. Unsure dose / combination vitamin  . EPINEPHrine (EPIPEN 2-PAK) 0.3 mg/0.3 mL IJ SOAJ injection INJECT AS DIRECTED FOR SEVERE ALLERGIC REACTION (Patient taking differently: Inject 0.3 mg into the muscle as needed (for severe allergic reaction). )  . fluocinonide gel (LIDEX) 4.09 % Apply 1 application topically as needed.  . Garlic 811 MG TABS Take 500 mg by mouth daily. Every other day (Patient not taking: Reported on 11/30/2019)  . hydrochlorothiazide (HYDRODIURIL) 12.5 MG tablet TAKE ONE TABLET EVERY DAY  . levothyroxine (SYNTHROID) 100 MCG tablet TAKE 1 TABLET BY MOUTH DAILY  . lisinopril (PRINIVIL,ZESTRIL) 40 MG tablet TAKE 1 TABLET BY MOUTH EVERY DAY (Patient not taking: Reported on 11/30/2019)  . metoprolol tartrate (LOPRESSOR) 25 MG tablet ONE AND ONE-HALF TABLETS BY MOUTH TWICE DAILY  . metoprolol tartrate 37.5 MG TABS Take 37.5 mg by mouth 2 (two) times daily.  . Multiple Vitamin (MULTI-VITAMINS) TABS Take 1 tablet by mouth daily.  Marland Kitchen nystatin (MYCOSTATIN) 100000 UNIT/ML suspension Take 100,000 Units by mouth 3 (three) times daily as needed. For mouth ulcers.  Marland Kitchen oxymetazoline (AFRIN) 0.05 % nasal spray Place 1 spray into both nostrils as needed for congestion.  . rosuvastatin (CRESTOR) 20 MG tablet Take 1 tablet (20 mg total) by mouth daily.  . sertraline (ZOLOFT) 25 MG tablet TAKE 1 TABLET BY MOUTH DAILY  . tacrolimus (PROTOPIC) 0.1 % ointment As needed for rosacea flare ups  .  traMADol (ULTRAM) 50 MG tablet Take 1 tablet (50 mg total) by mouth every 6 (six) hours as needed for moderate pain.  Marland Kitchen zolpidem (AMBIEN) 5 MG tablet TAKE ONE TABLET BY MOUTH AT BEDTIME AS NEEDED FOR SLEEP   No facility-administered medications prior to visit.    Review of Systems     Objective    There were no vitals taken for this visit. BP Readings from Last 3 Encounters:  12/26/19 121/78  11/30/19 121/67  02/21/19 132/72   Wt Readings from Last 3 Encounters:  12/26/19 189 lb 12.8 oz (86.1 kg)  11/30/19 188 lb 12.8 oz (85.6 kg)  02/21/19 167 lb (75.8 kg)        Assessment & Plan       No follow-ups on file.    I discussed the assessment and treatment plan with the patient. The patient was provided an opportunity to ask questions and all were answered. The patient agreed with the plan and  demonstrated an understanding of the instructions.   The patient was advised to call back or seek an in-person evaluation if the symptoms worsen or if the condition fails to improve as anticipated.  I provided 12 minutes of non-face-to-face time during this encounter.    Donna Snooks Cranford Mon, MD [facility] [phone] (phone) [phone] (fax)  [place]

## 2020-01-19 ENCOUNTER — Encounter (INDEPENDENT_AMBULATORY_CARE_PROVIDER_SITE_OTHER): Payer: Self-pay | Admitting: Vascular Surgery

## 2020-01-19 ENCOUNTER — Other Ambulatory Visit: Payer: Self-pay

## 2020-01-19 ENCOUNTER — Ambulatory Visit (INDEPENDENT_AMBULATORY_CARE_PROVIDER_SITE_OTHER): Payer: Medicare Other | Admitting: Vascular Surgery

## 2020-01-19 VITALS — BP 146/84 | HR 97 | Resp 16 | Ht 62.0 in | Wt 186.0 lb

## 2020-01-19 DIAGNOSIS — I890 Lymphedema, not elsewhere classified: Secondary | ICD-10-CM | POA: Insufficient documentation

## 2020-01-19 DIAGNOSIS — I10 Essential (primary) hypertension: Secondary | ICD-10-CM

## 2020-01-19 DIAGNOSIS — E785 Hyperlipidemia, unspecified: Secondary | ICD-10-CM | POA: Diagnosis not present

## 2020-01-19 DIAGNOSIS — I25709 Atherosclerosis of coronary artery bypass graft(s), unspecified, with unspecified angina pectoris: Secondary | ICD-10-CM | POA: Diagnosis not present

## 2020-01-19 NOTE — Assessment & Plan Note (Signed)
lipid control important in reducing the progression of atherosclerotic disease. Continue statin therapy  

## 2020-01-19 NOTE — Patient Instructions (Signed)

## 2020-01-19 NOTE — Assessment & Plan Note (Signed)
Her swelling really started after her vein harvest for bypass 3 years ago.

## 2020-01-19 NOTE — Assessment & Plan Note (Signed)
blood pressure control important in reducing the progression of atherosclerotic disease. On appropriate oral medications.  

## 2020-01-19 NOTE — Progress Notes (Signed)
Patient ID: [name], female   DOB: [birth year], 79 y.o.   MRN: [id number]  Chief Complaint  Patient presents with  . New Patient (Initial Visit)    ref Stacey [name] rle cellulitis    HPI Stacey [name] is a 79 y.o. female.  I am asked to see the patient by Dr. Rosanna [name] for evaluation of right leg pain and swelling. This is also associated with significant discoloration. This has been going on for many months. It has been a gradual worsening. The patient tries to elevate her legs for relief. She has tried wearing compression stockings with no improvement and difficulty getting these on and off. These were done under the direction of her primary care physician previously. She does have a previous history of pulmonary embolus with an unknown origin. She has also had her right great saphenous vein harvested for coronary artery bypass grafting. The swelling has gradually worsened since that harvest. No real left leg symptoms. She describes the discomfort as heaviness and soreness in the leg. The swelling progresses throughout the day. When she is on her feet more she notices the swelling more prominently. To evaluate her swelling a couple of weeks ago, a venous study was done that showed no evidence of DVT with an absent saphenous vein after harvesting in the right leg.   Past Medical History:  Diagnosis Date  . Abnormal nuclear stress test 05/24/2017  . Arthritis   . Asthma   . Atrial fibrillation, transient ([place])   . 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 Cancer ([place])   . Complication of anesthesia   . COVID-19   . Depression   . Family history of adverse reaction to anesthesia    most of family - PONV  . Fibromyalgia   . Glaucoma   . Headache    migraines - 1-2x/mo  . HTN (hypertension) 03/09/2007  . Hyperlipidemia LDL goal <70 03/25/1993  . Hypertension   . Motion sickness    all moving vehicles  . PONV (postoperative nausea and  vomiting)   . Squamous cell carcinoma of skin 07/25/2019   Left upper lateral lip above vermillion border. WD SCC with superficial infiltration.  . Stroke ([place]) 2019  . Thyroid disease     Past Surgical History:  Procedure Laterality Date  . ABDOMINAL HYSTERECTOMY    . APPENDECTOMY    . BLADDER SURGERY    . CATARACT EXTRACTION W/ INTRAOCULAR LENS  IMPLANT, BILATERAL    . COLONOSCOPY WITH PROPOFOL N/A 01/17/2016   Procedure: COLONOSCOPY WITH PROPOFOL;  Surgeon: [name], MD;  Location: [place];  Service: Endoscopy;  Laterality: N/A;  . CORONARY ARTERY BYPASS GRAFT N/A 02/20/2017   Procedure: CORONARY ARTERY BYPASS GRAFTING (CABG) x , three using left internal mammary artery to left anterior descending coronary artery and right greater saphenous vein harvested endoscopically to distal right and diagonal coronary arteries.;  Surgeon: Grace Isaac, MD;  Location: [place];  Service: Open Heart Surgery;  Laterality: N/A;  . LEFT HEART CATH AND CORONARY ANGIOGRAPHY N/A 02/20/2017   Procedure: LEFT HEART CATH AND CORONARY ANGIOGRAPHY;  Surgeon: Isaias Cowman, MD;  Location: [place] [facility];  Service: Cardiovascular;  Laterality: N/A;  . LEFT HEART CATH AND CORS/GRAFTS ANGIOGRAPHY N/A 05/24/2017   Procedure: LEFT HEART CATH AND CORS/GRAFTS ANGIOGRAPHY;  Surgeon: Sherren Mocha, MD;  Location: [place] [facility];  Service: Cardiovascular;  Laterality: N/A;  .  NASAL SINUS SURGERY    . POLYPECTOMY  01/17/2016   Procedure: POLYPECTOMY;  Surgeon: [name], MD;  Location: [place];  Service: Endoscopy;;  . RIGHT OOPHORECTOMY    . TEE WITHOUT CARDIOVERSION N/A 02/20/2017   Procedure: TRANSESOPHAGEAL ECHOCARDIOGRAM (TEE);  Surgeon: Grace Isaac, MD;  Location: [place];  Service: Open Heart Surgery;  Laterality: N/A;     Family History  Problem Relation Age of Onset  . Alzheimer's disease Mother   . Kidney cancer Mother   . Heart attack Father   .  Pancreatic cancer Sister   . Heart attack Brother 45  . Ovarian cancer Sister   . Kidney disease Sister        dialysis  . Cancer Sister        uterin  . Diabetes Sister   . Heart attack Sister   . Heart attack Brother 68  . Ovarian cancer Sister       Social History   Tobacco Use  . Smoking status: Former Smoker    Quit date: 05/26/1975    Years since quitting: 44.6  . Smokeless tobacco: Never Used  . Tobacco comment: quit 45  years ago   Vaping Use  . Vaping Use: Never used  Substance Use Topics  . Alcohol use: No  . Drug use: No     Allergies  Allergen Reactions  . Cephalexin Anaphylaxis  . Atorvastatin   . Morphine And Related Hives  . Other Hives and Other (See Comments)    Muscle pain  . Procaine Other (See Comments)    Chest pain Chest pain  . Shellfish Allergy Swelling    angioedema  . Statins Other (See Comments)    Muscle pain  . Tomato Other (See Comments)    (by testing) - also beans, wheat, peas (by testing) - also beans, wheat, peas  . Penicillins Hives and Rash    Has patient had a PCN reaction causing immediate rash, facial/tongue/throat swelling, SOB or lightheadedness with hypotension: Yes Has patient had a PCN reaction causing severe rash involving mucus membranes or skin necrosis: No Has patient had a PCN reaction that required hospitalization: No Has patient had a PCN reaction occurring within the last 10 years: Yes If all of the above answers are "NO", then may proceed with Cephalosporin use.    Current Outpatient Medications  Medication Sig Dispense Refill  . acetaminophen (TYLENOL) 325 MG tablet Take 2 tablets (650 mg total) by mouth every 6 (six) hours as needed for mild pain.    Marland Kitchen albuterol (VENTOLIN HFA) 108 (90 Base) MCG/ACT inhaler INHALE 2 PUFFS BY MOUTH EVERY 4 TO 6 HOURS AS NEEDED FOR SHORTNESS OF BREATH 18 Inhaler 5  . amLODipine (NORVASC) 5 MG tablet Take 1 tablet (5 mg total) daily by mouth. 90 tablet 3  . apixaban  (ELIQUIS) 5 MG TABS tablet Take 1 tablet (5 mg total) by mouth 2 (two) times daily. 60 tablet 2  . ascorbic acid (VITAMIN C) 500 MG tablet Take 500 mg by mouth daily.    Marland Kitchen aspirin 81 MG tablet Take 1 tablet (81 mg total) by mouth daily. 30 tablet 2  . buPROPion (WELLBUTRIN XL) 300 MG 24 hr tablet TAKE ONE TABLET BY MOUTH EVERY DAY 90 tablet 3  . Cholecalciferol (VITAMIN D) 2000 units tablet Take 2,000 Units by mouth daily.    . Cyanocobalamin (B-12) 500 MCG TABS Take 500 mcg by mouth daily.     . diazepam (VALIUM)  5 MG tablet Take 1 tablet (5 mg total) by mouth every 12 (twelve) hours as needed for anxiety. (Patient taking differently: Take 5 mg by mouth every 12 (twelve) hours as needed for anxiety. Takes 1/2 tablet) 60 tablet 1  . doxycycline (VIBRA-TABS) 100 MG tablet Take 1 tablet (100 mg total) by mouth 2 (two) times daily. 14 tablet 1  . ELDERBERRY PO Take by mouth daily. Unsure dose / combination vitamin    . EPINEPHrine (EPIPEN 2-PAK) 0.3 mg/0.3 mL IJ SOAJ injection INJECT AS DIRECTED FOR SEVERE ALLERGIC REACTION (Patient taking differently: Inject 0.3 mg into the muscle as needed (for severe allergic reaction). ) 1 Device 1  . fluocinonide gel (LIDEX) 6.44 % Apply 1 application topically as needed.    . hydrochlorothiazide (HYDRODIURIL) 12.5 MG tablet TAKE ONE TABLET EVERY DAY 30 tablet 11  . levothyroxine (SYNTHROID) 100 MCG tablet TAKE 1 TABLET BY MOUTH DAILY 90 tablet 3  . metoprolol tartrate (LOPRESSOR) 25 MG tablet ONE AND ONE-HALF TABLETS BY MOUTH TWICE DAILY 270 tablet 4  . metoprolol tartrate 37.5 MG TABS Take 37.5 mg by mouth 2 (two) times daily. 60 tablet 0  . Multiple Vitamin (MULTI-VITAMINS) TABS Take 1 tablet by mouth daily.    Marland Kitchen nystatin (MYCOSTATIN) 100000 UNIT/ML suspension Take 100,000 Units by mouth 3 (three) times daily as needed. For mouth ulcers.    Marland Kitchen oxymetazoline (AFRIN) 0.05 % nasal spray Place 1 spray into both nostrils as needed for congestion.    . rosuvastatin  (CRESTOR) 20 MG tablet Take 1 tablet (20 mg total) by mouth daily. 90 tablet 3  . sertraline (ZOLOFT) 25 MG tablet TAKE 1 TABLET BY MOUTH DAILY 30 tablet 0  . tacrolimus (PROTOPIC) 0.1 % ointment As needed for rosacea flare ups    . traMADol (ULTRAM) 50 MG tablet Take 1 tablet (50 mg total) by mouth every 6 (six) hours as needed for moderate pain. 90 tablet 3  . zolpidem (AMBIEN) 5 MG tablet TAKE ONE TABLET BY MOUTH AT BEDTIME AS NEEDED FOR SLEEP 30 tablet 1  . clindamycin (CLEOCIN) 150 MG capsule TAKE 1 CAPSULE 3 TIMES DAILY UNTIL FINISHED (Patient not taking: Reported on 01/19/2020) 21 capsule 0  . Garlic 034 MG TABS Take 500 mg by mouth daily. Every other day (Patient not taking: Reported on 11/30/2019)    . lisinopril (PRINIVIL,ZESTRIL) 40 MG tablet TAKE 1 TABLET BY MOUTH EVERY DAY (Patient not taking: Reported on 11/30/2019) 90 tablet 2   No current facility-administered medications for this visit.      REVIEW OF SYSTEMS (Negative unless checked)  Constitutional: _0 Weight loss  _1 Fever  _2 Chills Cardiac: _3 Chest pain   _4 Chest pressure   _5 Palpitations   _6 Shortness of breath when laying flat   _7 Shortness of breath at rest   _8 Shortness of breath with exertion. Vascular:  _9 Pain in legs with walking   _10 Pain in legs at rest   _11 Pain in legs when laying flat   _12 Claudication   _13 Pain in feet when walking  _14 Pain in feet at rest  _15 Pain in feet when laying flat   _16 History of DVT   _17 Phlebitis   _18 Swelling in legs   _19 Varicose veins   _20 Non-healing ulcers Pulmonary:   _21 Uses home oxygen   _22 Productive cough   _23 Hemoptysis   _24 Wheeze  _25 COPD   _26 Asthma Neurologic:  _27 Dizziness  _28 Blackouts   _29 Seizures   _30 History of stroke   _31 History of TIA  _32 Aphasia   _33 Temporary blindness   _34 Dysphagia   _35   Weakness or numbness in arms   _0 Weakness or numbness in legs Musculoskeletal:  _1 Arthritis   _2 Joint swelling   _3 Joint pain   _4 Low back pain Hematologic:  _5 Easy bruising  _6 Easy bleeding    _7 Hypercoagulable state   _8 Anemic  _9 Hepatitis Gastrointestinal:  _10 Blood in stool   _11 Vomiting blood  _12 Gastroesophageal reflux/heartburn   _13 Abdominal pain Genitourinary:  _14 Chronic kidney disease   _15 Difficult urination  _16 Frequent urination  _17 Burning with urination   _18 Hematuria Skin:  _19 Rashes   _20 Ulcers   _21 Wounds Psychological:  _22 History of anxiety   _23  History of major depression.    Physical Exam BP (!) 146/84 (BP Location: Left Arm)   Pulse 97   Resp 16   Ht _24  (1.575 m)   Wt 186 lb (84.4 kg)   BMI 34.02 kg/m  Gen:  WD/WN, NAD. Appears younger than stated age Head: [place]/AT, No temporalis wasting. Ear/Nose/Throat: Hearing grossly intact, nares w/o erythema or drainage, oropharynx w/o Erythema/Exudate Eyes: Conjunctiva clear, sclera non-icteric  Neck: trachea midline.  No JVD.  Pulmonary:  Good air movement, respirations not labored, no use of accessory muscles  Cardiac: RRR, no JVD Vascular:  Vessel Right Left  Radial Palpable Palpable                          DP  1+  2+  PT  trace  1+   Gastrointestinal:. No masses, surgical incisions, or scars. Musculoskeletal: M/S 5/5 throughout.  Extremities without ischemic changes.  No deformity or atrophy. 1-2+ right lower extremity edema. Moderate stasis dermatitis changes present in the right leg Neurologic: Sensation grossly intact in extremities.  Symmetrical.  Speech is fluent. Motor exam as listed above. Psychiatric: Judgment intact, Mood & affect appropriate for pt's clinical situation. Dermatologic: No rashes or ulcers noted.  No cellulitis or open wounds.    Radiology VAS [place] LOWER EXTREMITY VENOUS (DVT)  Result Date: 01/09/2020  Lower Venous DVTStudy Indications: Intermittant swelling post GSV harvesting right leg.  Performing Technologist: Concha Norway RVT  Examination Guidelines: A complete evaluation includes B-mode imaging, spectral Doppler, color Doppler, and power Doppler as needed of all accessible  portions of each vessel. Bilateral testing is considered an integral part of a complete examination. Limited examinations for reoccurring indications may be performed as noted. The reflux portion of the exam is performed with the patient in reverse Trendelenburg.  +---------+---------------+---------+-----------+----------+--------------+ RIGHT    CompressibilityPhasicitySpontaneityPropertiesThrombus Aging +---------+---------------+---------+-----------+----------+--------------+ CFV      Full           Yes      Yes                                 +---------+---------------+---------+-----------+----------+--------------+ SFJ      Full                                                        +---------+---------------+---------+-----------+----------+--------------+ FV Prox  Full           Yes      Yes                                 +---------+---------------+---------+-----------+----------+--------------+ FV Mid  Full           Yes      Yes                                 +---------+---------------+---------+-----------+----------+--------------+ FV DistalFull           Yes      Yes                                 +---------+---------------+---------+-----------+----------+--------------+ PFV      Full           Yes      Yes                                 +---------+---------------+---------+-----------+----------+--------------+ POP      Full           Yes      Yes                                 +---------+---------------+---------+-----------+----------+--------------+ PTV      Full           Yes      Yes                                 +---------+---------------+---------+-----------+----------+--------------+ PERO     Full           Yes      Yes                                 +---------+---------------+---------+-----------+----------+--------------+ Gastroc  Full           Yes      Yes                                  +---------+---------------+---------+-----------+----------+--------------+ SSV      Full                                                        +---------+---------------+---------+-----------+----------+--------------+   +----+---------------+---------+-----------+----------+--------------+ LEFTCompressibilityPhasicitySpontaneityPropertiesThrombus Aging +----+---------------+---------+-----------+----------+--------------+ CFV Full           Yes      Yes                                 +----+---------------+---------+-----------+----------+--------------+ SFJ Full           Yes      Yes                                 +----+---------------+---------+-----------+----------+--------------+     Summary: RIGHT: - No evidence of deep vein thrombosis in the lower extremity. No indirect evidence of obstruction proximal to the inguinal ligament.  LEFT: - No evidence of common femoral vein obstruction.  *See table(s) above for measurements  and observations. Electronically signed by Leotis Pain MD on 01/09/2020 at 9:06:44 AM.    Final     Labs Recent Results (from the past 2160 hour(s))  CBC with Differential/Platelet     Status: Abnormal   Collection Time: 12/26/19  8:51 AM  Result Value Ref Range   WBC 5.5 3.4 - 10.8 x10E3/uL   RBC 5.01 3.77 - 5.28 x10E6/uL   Hemoglobin 14.5 11.1 - 15.9 g/dL   Hematocrit 45.0 34.0 - 46.6 %   MCV 90 79 - 97 fL   MCH 28.9 26.6 - 33.0 pg   MCHC 32.2 31 - 35 g/dL   RDW 12.3 11.7 - 15.4 %   Platelets 161 150 - 450 x10E3/uL   Neutrophils 61 Not Estab. %   Lymphs 19 Not Estab. %   Monocytes 10 Not Estab. %   Eos 9 Not Estab. %   Basos 1 Not Estab. %   Neutrophils Absolute 3.3 1 - 7 x10E3/uL   Lymphocytes Absolute 1.1 0 - 3 x10E3/uL   Monocytes Absolute 0.6 0 - 0 x10E3/uL   EOS (ABSOLUTE) 0.5 (H) 0.0 - 0.4 x10E3/uL   Basophils Absolute 0.1 0 - 0 x10E3/uL   Immature Granulocytes 0 Not Estab. %   Immature Grans (Abs) 0.0 0.0 - 0.1 x10E3/uL  TSH      Status: None   Collection Time: 12/26/19  8:51 AM  Result Value Ref Range   TSH 3.750 0.450 - 4.500 uIU/mL  Comprehensive metabolic panel     Status: Abnormal   Collection Time: 12/26/19  8:51 AM  Result Value Ref Range   Glucose 122 (H) 65 - 99 mg/dL   BUN 17 8 - 27 mg/dL   Creatinine, Ser 1.03 (H) 0.57 - 1.00 mg/dL   GFR calc non Af Amer 53 (L) >59 mL/min/1.73   GFR calc Af Amer 61 >59 mL/min/1.73    Comment: **[facility] currently reports eGFR in compliance with the current**   recommendations of the [employer]. [facility] will   update reporting as new guidelines are published from the NKF-ASN   Task force.    BUN/Creatinine Ratio 17 12 - 28   Sodium 137 134 - 144 mmol/L   Potassium 4.2 3.5 - 5.2 mmol/L   Chloride 97 96 - 106 mmol/L   CO2 25 20 - 29 mmol/L   Calcium 9.2 8.7 - 10.3 mg/dL   Total Protein 6.4 6.0 - 8.5 g/dL   Albumin 4.5 3.7 - 4.7 g/dL   Globulin, Total 1.9 1.5 - 4.5 g/dL   Albumin/Globulin Ratio 2.4 (H) 1.2 - 2.2   Bilirubin Total 0.4 0.0 - 1.2 mg/dL   Alkaline Phosphatase 90 48 - 121 IU/L   AST 19 0 - 40 IU/L   ALT 14 0 - 32 IU/L  Lipid panel     Status: None   Collection Time: 12/26/19  8:51 AM  Result Value Ref Range   Cholesterol, Total 172 100 - 199 mg/dL   Triglycerides 100 0 - 149 mg/dL   HDL 65 >39 mg/dL   VLDL Cholesterol Cal 18 5 - 40 mg/dL   LDL Chol Calc (NIH) 89 0 - 99 mg/dL   Chol/HDL Ratio 2.6 0.0 - 4.4 ratio    Comment:                                   T. Chol/HDL Ratio  Men  Women                               1/2 Avg.Risk  3.4    3.3                                   Avg.Risk  5.0    4.4                                2X Avg.Risk  9.6    7.1                                3X Avg.Risk 23.4   11.0     Assessment/Plan:  HTN (hypertension) blood pressure control important in reducing the progression of atherosclerotic disease. On appropriate oral  medications.   Hyperlipidemia LDL goal <70 lipid control important in reducing the progression of atherosclerotic disease. Continue statin therapy   Coronary artery disease involving coronary bypass graft of native heart Her swelling really started after her vein harvest for bypass 3 years ago.  Lymphedema The patient has developed stage II lymphedema from chronic scarring and lymphatic channels as well as some postphlebitic changes to the right leg. No venous disease requiring treatment is currently present. She would benefit from a lymphedema pump to help manage her swelling and pain. She has dermal thickening and swelling refractory to elevation. Compression has not helped when she has done this previously and she says it actually makes the pain worse. She does elevate her legs and exercises her feet and legs regularly. We will obtain a lymphedema pump and then reassess her in a few months to see how she is doing with therapy.      Leotis Pain 01/19/2020, 11:20 AM   This note was created with Dragon medical transcription system.  Any errors from dictation are unintentional.

## 2020-01-19 NOTE — Assessment & Plan Note (Signed)
The patient has developed stage II lymphedema from chronic scarring and lymphatic channels as well as some postphlebitic changes to the right leg. No venous disease requiring treatment is currently present. She would benefit from a lymphedema pump to help manage her swelling and pain. She has dermal thickening and swelling refractory to elevation. Compression has not helped when she has done this previously and she says it actually makes the pain worse. She does elevate her legs and exercises her feet and legs regularly. We will obtain a lymphedema pump and then reassess her in a few months to see how she is doing with therapy.

## 2020-02-06 DIAGNOSIS — I340 Nonrheumatic mitral (valve) insufficiency: Secondary | ICD-10-CM | POA: Diagnosis not present

## 2020-02-06 DIAGNOSIS — I10 Essential (primary) hypertension: Secondary | ICD-10-CM | POA: Diagnosis not present

## 2020-02-06 DIAGNOSIS — I25810 Atherosclerosis of coronary artery bypass graft(s) without angina pectoris: Secondary | ICD-10-CM | POA: Diagnosis not present

## 2020-02-06 DIAGNOSIS — I4819 Other persistent atrial fibrillation: Secondary | ICD-10-CM | POA: Diagnosis not present

## 2020-02-06 DIAGNOSIS — G4733 Obstructive sleep apnea (adult) (pediatric): Secondary | ICD-10-CM | POA: Diagnosis not present

## 2020-02-06 DIAGNOSIS — E782 Mixed hyperlipidemia: Secondary | ICD-10-CM | POA: Diagnosis not present

## 2020-02-17 ENCOUNTER — Other Ambulatory Visit: Payer: Self-pay | Admitting: Family Medicine

## 2020-02-17 DIAGNOSIS — E785 Hyperlipidemia, unspecified: Secondary | ICD-10-CM

## 2020-02-17 NOTE — Telephone Encounter (Signed)
Requested Prescriptions  Pending Prescriptions Disp Refills  . rosuvastatin (CRESTOR) 20 MG tablet [Pharmacy Med Name: ROSUVASTATIN CALCIUM 20 MG TAB] 90 tablet 3    Sig: TAKE ONE TABLET (20 MG) BY MOUTH EVERY EVENING     Cardiovascular:  Antilipid - Statins Failed - 02/17/2020  9:35 AM      Failed - LDL in normal range and within 360 days    LDL Cholesterol (Calc)  Date Value Ref Range Status  04/29/2017 86 mg/dL (calc) Final    Comment:    Reference range: <100 . Desirable range <100 mg/dL for primary prevention;   <70 mg/dL for patients with CHD or diabetic patients  with > or = 2 CHD risk factors. Marland Kitchen LDL-C is now calculated using the Martin-Hopkins  calculation, which is a validated novel method providing  better accuracy than the Friedewald equation in the  estimation of LDL-C.  Cresenciano Genre et al. Annamaria Helling. 7493;552(17): 2061-2068  (http://education.[facility].com/faq/FAQ164)    LDL Chol Calc (NIH)  Date Value Ref Range Status  12/26/2019 89 0 - 99 mg/dL Final         Passed - Total Cholesterol in normal range and within 360 days    Cholesterol, Total  Date Value Ref Range Status  12/26/2019 172 100 - 199 mg/dL Final         Passed - HDL in normal range and within 360 days    HDL  Date Value Ref Range Status  12/26/2019 65 >39 mg/dL Final         Passed - Triglycerides in normal range and within 360 days    Triglycerides  Date Value Ref Range Status  12/26/2019 100 0 - 149 mg/dL Final         Passed - Patient is not pregnant      Passed - Valid encounter within last 12 months    Recent Outpatient Visits          1 month ago Cough   [facility] [name], MD   1 month ago Venous insufficiency of right leg   [facility] [name], MD   2 months ago Cellulitis of right lower extremity   [facility] [name], MD   12 months ago Back pain, unspecified back location,  unspecified back pain laterality, unspecified chronicity   [facility] [name], MD   1 year ago Chest pain, unspecified type   [facility] [name], MD      Future Appointments            In 2 weeks Ralene Bathe, MD [place]   In 1 month [name], MD [facility]

## 2020-02-26 NOTE — Progress Notes (Addendum)
Subjective:   Stacey [name] is a 79 y.o. female who presents for Medicare Annual (Subsequent) preventive examination.  I connected with Cherie Lasalle today by telephone and verified that I am speaking with the correct person using two identifiers. Location patient: home Location provider: work Persons participating in the virtual visit: patient, provider.   I discussed the limitations, risks, security and privacy concerns of performing an evaluation and management service by telephone and the availability of in person appointments. I also discussed with the patient that there may be a patient responsible charge related to this service. The patient expressed understanding and verbally consented to this telephonic visit.    Interactive audio and video telecommunications were attempted between this provider and patient, however failed, due to patient having technical difficulties OR patient did not have access to video capability.  We continued and completed visit with audio only.   Review of Systems    N/A  Cardiac Risk Factors include: advanced age (>35men, >44 women);dyslipidemia;hypertension;obesity (BMI >30kg/m2)     Objective:    Today's Vitals   02/27/20 0921  PainSc: 8    There is no height or weight on file to calculate BMI.  Advanced Directives 02/27/2020 02/21/2019 11/30/2018 10/26/2018 10/25/2018 03/21/2018 12/20/2017  Does Patient Have a Medical Advance Directive? Yes No Yes No No No Yes  Type of Paramedic of [place];Living will - [place];Living will - - - [place];Living will  Does patient want to make changes to medical advance directive? - No - Patient declined No - Patient declined - - - -  Copy of [place] in Chart? No - copy requested - No - copy requested - - - No - copy requested  Would patient like information on creating a medical advance directive? - - - No - Patient declined  No - Patient declined No - Patient declined -    Current Medications (verified) Outpatient Encounter Medications as of 02/27/2020  Medication Sig  . acetaminophen (TYLENOL) 325 MG tablet Take 2 tablets (650 mg total) by mouth every 6 (six) hours as needed for mild pain.  Marland Kitchen albuterol (VENTOLIN HFA) 108 (90 Base) MCG/ACT inhaler INHALE 2 PUFFS BY MOUTH EVERY 4 TO 6 HOURS AS NEEDED FOR SHORTNESS OF BREATH  . amLODipine (NORVASC) 5 MG tablet Take 1 tablet (5 mg total) daily by mouth.  Marland Kitchen apixaban (ELIQUIS) 5 MG TABS tablet Take 1 tablet (5 mg total) by mouth 2 (two) times daily.  Marland Kitchen ascorbic acid (VITAMIN C) 500 MG tablet Take 2,000 mg by mouth daily.   Marland Kitchen aspirin 81 MG tablet Take 1 tablet (81 mg total) by mouth daily.  Marland Kitchen buPROPion (WELLBUTRIN XL) 300 MG 24 hr tablet TAKE ONE TABLET BY MOUTH EVERY DAY  . Cholecalciferol (VITAMIN D) 2000 units tablet Take 2,000 Units by mouth daily.  . Cyanocobalamin (B-12) 500 MCG TABS Take 500 mcg by mouth daily.   . diazepam (VALIUM) 5 MG tablet Take 1 tablet (5 mg total) by mouth every 12 (twelve) hours as needed for anxiety. (Patient taking differently: Take 5 mg by mouth every 12 (twelve) hours as needed for anxiety. Takes 1/2 tablet)  . ELDERBERRY PO Take by mouth daily. Unsure dose / combination vitamin  . EPINEPHrine (EPIPEN 2-PAK) 0.3 mg/0.3 mL IJ SOAJ injection INJECT AS DIRECTED FOR SEVERE ALLERGIC REACTION (Patient taking differently: Inject 0.3 mg into the muscle as needed (for severe allergic reaction). )  . fluocinonide  gel (LIDEX) 7.49 % Apply 1 application topically as needed.  . hydrochlorothiazide (HYDRODIURIL) 12.5 MG tablet TAKE ONE TABLET EVERY DAY  . levothyroxine (SYNTHROID) 100 MCG tablet TAKE ONE TABLET BY MOUTH EVERY DAY  . metoprolol tartrate (LOPRESSOR) 25 MG tablet ONE AND ONE-HALF TABLETS BY MOUTH TWICE DAILY  . nystatin (MYCOSTATIN) 100000 UNIT/ML suspension Take 100,000 Units by mouth 3 (three) times daily as needed. For mouth ulcers.    Marland Kitchen oxymetazoline (AFRIN) 0.05 % nasal spray Place 1 spray into both nostrils as needed for congestion.  . rosuvastatin (CRESTOR) 20 MG tablet TAKE ONE TABLET (20 MG) BY MOUTH EVERY EVENING  . sertraline (ZOLOFT) 25 MG tablet TAKE 1 TABLET BY MOUTH DAILY  . tacrolimus (PROTOPIC) 0.1 % ointment As needed for rosacea flare ups  . traMADol (ULTRAM) 50 MG tablet Take 1 tablet (50 mg total) by mouth every 6 (six) hours as needed for moderate pain.  Marland Kitchen zinc gluconate 50 MG tablet Take 50 mg by mouth daily.   Marland Kitchen zolpidem (AMBIEN) 5 MG tablet TAKE ONE TABLET BY MOUTH AT BEDTIME AS NEEDED FOR SLEEP  . clindamycin (CLEOCIN) 150 MG capsule TAKE 1 CAPSULE 3 TIMES DAILY UNTIL FINISHED (Patient not taking: Reported on 01/19/2020)  . doxycycline (VIBRA-TABS) 100 MG tablet Take 1 tablet (100 mg total) by mouth 2 (two) times daily. (Patient not taking: Reported on 02/27/2020)  . Garlic 449 MG TABS Take 500 mg by mouth daily. Every other day (Patient not taking: Reported on 02/27/2020)  . lisinopril (PRINIVIL,ZESTRIL) 40 MG tablet TAKE 1 TABLET BY MOUTH EVERY DAY (Patient not taking: Reported on 11/30/2019)  . metoprolol tartrate 37.5 MG TABS Take 37.5 mg by mouth 2 (two) times daily. (Patient not taking: Reported on 02/27/2020)  . Multiple Vitamin (MULTI-VITAMINS) TABS Take 1 tablet by mouth daily. (Patient not taking: Reported on 02/27/2020)  . [DISCONTINUED] levothyroxine (SYNTHROID) 100 MCG tablet TAKE 1 TABLET BY MOUTH DAILY  . [DISCONTINUED] sertraline (ZOLOFT) 25 MG tablet TAKE 1 TABLET BY MOUTH DAILY   No facility-administered encounter medications on file as of 02/27/2020.    Allergies (verified) Cephalexin, Atorvastatin, Morphine and related, Other, Procaine, Shellfish allergy, Statins, Tomato, and Penicillins   History: Past Medical History:  Diagnosis Date  . Abnormal nuclear stress test 05/24/2017  . Arthritis   . Asthma   . Atrial fibrillation, transient ([place])   . CAD (coronary artery disease)    a.  s/p CABG x 3: VG->dRCA, VG->D1, LIMA->LAD  . Cancer ([place])   . Complication of anesthesia   . COVID-19   . Depression   . Family history of adverse reaction to anesthesia    most of family - PONV  . Fibromyalgia   . Glaucoma   . Headache    migraines - 1-2x/mo  . HTN (hypertension) 03/09/2007  . Hyperlipidemia LDL goal <70 03/25/1993  . Hypertension   . Motion sickness    all moving vehicles  . PONV (postoperative nausea and vomiting)   . Squamous cell carcinoma of skin 07/25/2019   Left upper lateral lip above vermillion border. WD SCC with superficial infiltration.  . Stroke ([place]) 2019  . Thyroid disease    Past Surgical History:  Procedure Laterality Date  . ABDOMINAL HYSTERECTOMY    . APPENDECTOMY    . BLADDER SURGERY    . CATARACT EXTRACTION W/ INTRAOCULAR LENS  IMPLANT, BILATERAL    . COLONOSCOPY WITH PROPOFOL N/A 01/17/2016   Procedure: COLONOSCOPY WITH PROPOFOL;  Surgeon: [name], MD;  Location: [place];  Service: Endoscopy;  Laterality: N/A;  . CORONARY ARTERY BYPASS GRAFT N/A 02/20/2017   Procedure: CORONARY ARTERY BYPASS GRAFTING (CABG) x , three using left internal mammary artery to left anterior descending coronary artery and right greater saphenous vein harvested endoscopically to distal right and diagonal coronary arteries.;  Surgeon: Grace Isaac, MD;  Location: [place];  Service: Open Heart Surgery;  Laterality: N/A;  . LEFT HEART CATH AND CORONARY ANGIOGRAPHY N/A 02/20/2017   Procedure: LEFT HEART CATH AND CORONARY ANGIOGRAPHY;  Surgeon: Isaias Cowman, MD;  Location: [place] [facility];  Service: Cardiovascular;  Laterality: N/A;  . LEFT HEART CATH AND CORS/GRAFTS ANGIOGRAPHY N/A 05/24/2017   Procedure: LEFT HEART CATH AND CORS/GRAFTS ANGIOGRAPHY;  Surgeon: Sherren Mocha, MD;  Location: [place] [facility];  Service: Cardiovascular;  Laterality: N/A;  . NASAL SINUS SURGERY    . POLYPECTOMY  01/17/2016   Procedure: POLYPECTOMY;  Surgeon:  [name], MD;  Location: [place];  Service: Endoscopy;;  . RIGHT OOPHORECTOMY    . TEE WITHOUT CARDIOVERSION N/A 02/20/2017   Procedure: TRANSESOPHAGEAL ECHOCARDIOGRAM (TEE);  Surgeon: Grace Isaac, MD;  Location: [place];  Service: Open Heart Surgery;  Laterality: N/A;   Family History  Problem Relation Age of Onset  . Alzheimer's disease Mother   . Kidney cancer Mother   . Heart attack Father   . Pancreatic cancer Sister   . Heart attack Brother 46  . Ovarian cancer Sister   . Kidney disease Sister        dialysis  . Cancer Sister        uterin  . Diabetes Sister   . Heart attack Sister   . Heart attack Brother 16  . Ovarian cancer Sister    Social History   Socioeconomic History  . Marital status: Married    Spouse name: [name]  . Number of children: 3  . Years of education: Not on file  . Highest education level: GED or equivalent  Occupational History    Employer: [employer]  Tobacco Use  . Smoking status: Former Smoker    Quit date: 05/26/1975    Years since quitting: 44.7  . Smokeless tobacco: Never Used  . Tobacco comment: quit 45  years ago   Vaping Use  . Vaping Use: Never used  Substance and Sexual Activity  . Alcohol use: No  . Drug use: No  . Sexual activity: Not Currently  Other Topics Concern  . Not on file  Social History Narrative  . Not on file   Social Determinants of Health   Financial Resource Strain: Low Risk   . Difficulty of Paying Living Expenses: Not hard at all  Food Insecurity: No Food Insecurity  . Worried About Charity fundraiser in the Last Year: Never true  . Ran Out of Food in the Last Year: Never true  Transportation Needs: No Transportation Needs  . Lack of Transportation (Medical): No  . Lack of Transportation (Non-Medical): No  Physical Activity: Inactive  . Days of Exercise per Week: 0 days  . Minutes of Exercise per Session: 0 min  Stress: No Stress Concern Present  . Feeling of Stress : Only  a little  Social Connections: Socially Integrated  . Frequency of Communication with Friends and Family: More than three times a week  . Frequency of Social Gatherings with Friends and Family: More than three times a week  . Attends Religious Services: More than 4  times per year  . Active Member of Clubs or Organizations: Yes  . Attends Archivist Meetings: Never  . Marital Status: Married    Tobacco Counseling Counseling given: Not Answered Comment: quit 45  years ago    Clinical Intake:  Pre-visit preparation completed: Yes  Pain : 0-10 Pain Score: 8  Pain Type: Chronic pain Pain Location: Hip Pain Orientation: Right, Left Pain Descriptors / Indicators: Aching Pain Frequency: Constant Pain Relieving Factors: Takes Tylenol as needed for pain.  Pain Relieving Factors: Takes Tylenol as needed for pain.  Nutritional Risks: None Diabetes: No  How often do you need to have someone help you when you read instructions, pamphlets, or other written materials from your doctor or pharmacy?: 1 - Never  Diabetic? No  Interpreter Needed?: No  Information entered by :: [facility], LPN   Activities of Daily Living In your present state of health, do you have any difficulty performing the following activities: 02/27/2020  Hearing? N  Vision? N  Difficulty concentrating or making decisions? N  Walking or climbing stairs? Y  Comment Due to hip pains.  Dressing or bathing? N  Doing errands, shopping? N  Preparing Food and eating ? N  Using the Toilet? N  In the past six months, have you accidently leaked urine? Y  Comment Occaisonally, wears pads as needed.  Do you have problems with loss of bowel control? N  Managing your Medications? N  Managing your Finances? N  Housekeeping or managing your Housekeeping? N  Some recent data might be hidden    Patient Care Team: [name], MD as PCP - General (Family Medicine) Leandrew Koyanagi, MD as Referring  Physician (Ophthalmology) Corey Skains, MD as Consulting Physician (Cardiology) Beverly Gust, MD (Otolaryngology)  Indicate any recent Medical Services you may have received from other than [facility] providers in the past year (date may be approximate).     Assessment:   This is a routine wellness examination for [place].  Hearing/Vision screen No exam data present  Dietary issues and exercise activities discussed: Current Exercise Habits: The patient does not participate in regular exercise at present, Exercise limited by: orthopedic condition(s)  Goals    . Cut out extra servings     Recommend to eat 3 small meals a day with 2 healthy snacks in between.      Depression Screen PHQ 2/9 Scores 02/27/2020 02/21/2019 08/22/2018 12/20/2017 04/27/2017 03/22/2017 12/17/2016  PHQ - 2 Score 1 2 2 2 3 6  0  PHQ- 9 Score - 7 - 15 10 19 6     Fall Risk Fall Risk  02/27/2020 02/21/2019 08/22/2018 12/20/2017 12/17/2016  Falls in the past year? 0 0 1 No No  Number falls in past yr: 0 0 0 - -  Injury with Fall? 0 0 0 - -    Any stairs in or around the home? Yes  If so, are there any without handrails? No  Home free of loose throw rugs in walkways, pet beds, electrical cords, etc? Yes  Adequate lighting in your home to reduce risk of falls? Yes   ASSISTIVE DEVICES UTILIZED TO PREVENT FALLS:  Life alert? No  Use of a cane, walker or w/c? No  Grab bars in the bathroom? Yes  Shower chair or bench in shower? No  Elevated toilet seat or a handicapped toilet? No    Cognitive Function:     6CIT Screen 02/27/2020 12/17/2016  What Year? 0 points 0 points  What  month? 0 points 0 points  What time? 0 points 0 points  Count back from 20 0 points 0 points  Months in reverse 0 points 0 points  Repeat phrase 4 points 2 points  Total Score 4 2    Immunizations Immunization History  Administered Date(s) Administered  . Fluad Quad(high Dose 65+) 02/21/2019  . Influenza, High Dose Seasonal PF  02/17/2017, 02/14/2018  . Influenza,inj,Quad PF,6+ Mos 01/24/2015  . Influenza-Unspecified 02/16/2017  . Pneumococcal Conjugate-13 11/22/2013  . Pneumococcal Polysaccharide-23 03/30/2005, 11/19/2011  . Tdap 10/01/2005  . Zoster 11/19/2011    TDAP status: Up to date Flu Vaccine status: Declined, Education has been provided regarding the importance of this vaccine but patient still declined. Advised may receive this vaccine at local pharmacy or Health Dept. Aware to provide a copy of the vaccination record if obtained from local pharmacy or Health Dept. Verbalized acceptance and understanding. Pneumococcal vaccine status: Up to date Covid-19 vaccine status: Declined, Education has been provided regarding the importance of this vaccine but patient still declined. Advised may receive this vaccine at local pharmacy or Health Dept.or vaccine clinic. Aware to provide a copy of the vaccination record if obtained from local pharmacy or Health Dept. Verbalized acceptance and understanding.  Qualifies for Shingles Vaccine? Yes   Zostavax completed Yes   Shingrix Completed?: No.    Education has been provided regarding the importance of this vaccine. Patient has been advised to call insurance company to determine out of pocket expense if they have not yet received this vaccine. Advised may also receive vaccine at local pharmacy or Health Dept. Verbalized acceptance and understanding.  Screening Tests Health Maintenance  Topic Date Due  . INFLUENZA VACCINE  12/24/2019  . COVID-19 Vaccine (1) 03/14/2020 (Originally 08/15/1952)  . MAMMOGRAM  02/29/2020  . TETANUS/TDAP  09/06/2025  . DEXA SCAN  Completed  . PNA vac Low Risk Adult  Completed    Health Maintenance  Health Maintenance Due  Topic Date Due  . INFLUENZA VACCINE  12/24/2019    Colorectal cancer screening: No longer required.  Mammogram status: Completed 03/01/19. Repeat every year. Order placed today. Pt to call [facility] and  schedule apt. Bone Density status: Completed 12/27/15. Results reflect: Previous DEXA scan was normal. No repeat needed unless advised by a physician.  Lung Cancer Screening: (Low Dose CT Chest recommended if Age 43-80 years, 30 pack-year currently smoking OR have quit w/in 15years.) does not qualify.   Additional Screening:  Vision Screening: Recommended annual ophthalmology exams for early detection of glaucoma and other disorders of the eye. Is the patient up to date with their annual eye exam?  Yes  Who is the provider or what is the name of the office in which the patient attends annual eye exams? Dr Wallace Going @ [place] If pt is not established with a provider, would they like to be referred to a provider to establish care? No .   Dental Screening: Recommended annual dental exams for proper oral hygiene  Community Resource Referral / Chronic Care Management: CRR required this visit?  No   CCM required this visit?  No      Plan:     I have personally reviewed and noted the following in the patient's chart:   . Medical and social history . Use of alcohol, tobacco or illicit drugs  . Current medications and supplements . Functional ability and status . Nutritional status . Physical activity . Advanced directives . List of other physicians . Hospitalizations,  surgeries, and ER visits in previous 12 months . Vitals . Screenings to include cognitive, depression, and falls . Referrals and appointments  In addition, I have reviewed and discussed with patient certain preventive protocols, quality metrics, and best practice recommendations. A written personalized care plan for preventive services as well as general preventive health recommendations were provided to patient.     Iran Kievit [place], [place]   98/01/2118   Nurse Notes: Pt to receive her flu shot at next in office apt or at pharmacy this fall.

## 2020-02-27 ENCOUNTER — Ambulatory Visit (INDEPENDENT_AMBULATORY_CARE_PROVIDER_SITE_OTHER): Payer: Medicare Other

## 2020-02-27 ENCOUNTER — Other Ambulatory Visit: Payer: Self-pay

## 2020-02-27 ENCOUNTER — Other Ambulatory Visit: Payer: Self-pay | Admitting: Family Medicine

## 2020-02-27 DIAGNOSIS — F5101 Primary insomnia: Secondary | ICD-10-CM

## 2020-02-27 DIAGNOSIS — Z0000 Encounter for general adult medical examination without abnormal findings: Secondary | ICD-10-CM

## 2020-02-27 DIAGNOSIS — Z1231 Encounter for screening mammogram for malignant neoplasm of breast: Secondary | ICD-10-CM

## 2020-02-27 DIAGNOSIS — F411 Generalized anxiety disorder: Secondary | ICD-10-CM

## 2020-02-27 NOTE — Telephone Encounter (Signed)
Requested Prescriptions  Pending Prescriptions Disp Refills  . sertraline (ZOLOFT) 25 MG tablet [Pharmacy Med Name: SERTRALINE HCL 25 MG TAB] 30 tablet 0    Sig: TAKE 1 TABLET BY MOUTH DAILY     Psychiatry:  Antidepressants - SSRI Failed - 02/27/2020  8:56 AM      Failed - Completed PHQ-2 or PHQ-9 in the last 360 days.      Passed - Valid encounter within last 6 months    Recent Outpatient Visits          1 month ago Cough   [facility] [name], MD   2 months ago Venous insufficiency of right leg   [facility] [name] MD   2 months ago Cellulitis of right lower extremity   [facility] [name], MD   1 year ago Back pain, unspecified back location, unspecified back pain laterality, unspecified chronicity   [facility] [name], MD   1 year ago Chest pain, unspecified type   [facility] [name], MD      Future Appointments            In 1 week Ralene Bathe, MD [place]   In 4 weeks [name], MD [facility]           . levothyroxine (SYNTHROID) 100 MCG tablet [Pharmacy Med Name: LEVOTHYROXINE SODIUM 100 MCG TAB] 90 tablet 3    Sig: TAKE ONE TABLET BY MOUTH EVERY DAY     Endocrinology:  Hypothyroid Agents Failed - 02/27/2020  8:56 AM      Failed - TSH needs to be rechecked within 3 months after an abnormal result. Refill until TSH is due.      Passed - TSH in normal range and within 360 days    TSH  Date Value Ref Range Status  12/26/2019 3.750 0.450 - 4.500 uIU/mL Final         Passed - Valid encounter within last 12 months    Recent Outpatient Visits          1 month ago Cough   [facility] [name], MD   2 months ago Venous insufficiency of right leg   [facility] [name], MD   2 months ago Cellulitis of right  lower extremity   [facility] [name], MD   1 year ago Back pain, unspecified back location, unspecified back pain laterality, unspecified chronicity   [facility] [name], MD   1 year ago Chest pain, unspecified type   [facility] [name], MD      Future Appointments            In 1 week Ralene Bathe, MD [place]   In 4 weeks [name], MD [facility]           . zolpidem (AMBIEN) 5 MG tablet [Pharmacy Med Name: ZOLPIDEM TARTRATE 5 MG TAB] 30 tablet     Sig: TAKE ONE TABLET BY MOUTH AT BEDTIME AS NEEDED FOR SLEEP     Not Delegated - Psychiatry:  Anxiolytics/Hypnotics Failed - 02/27/2020  8:56 AM      Failed - This refill cannot be delegated      Failed - Urine Drug Screen completed in last  360 days.      Passed - Valid encounter within last 6 months    Recent Outpatient Visits          1 month ago Cough   [facility] [name], MD   2 months ago Venous insufficiency of right leg   [facility] [name], MD   2 months ago Cellulitis of right lower extremity   [facility] [name], MD   1 year ago Back pain, unspecified back location, unspecified back pain laterality, unspecified chronicity   [facility] [name], MD   1 year ago Chest pain, unspecified type   [facility] [name], MD      Future Appointments            In 1 week Ralene Bathe, MD [place]   In 4 weeks [name], MD [facility]

## 2020-02-27 NOTE — Patient Instructions (Signed)
Stacey [name] , Thank you for taking time to come for your Medicare Wellness Visit. I appreciate your ongoing commitment to your health goals. Please review the following plan we discussed and let me know if I can assist you in the future.   Screening recommendations/referrals: Colonoscopy: No longer required.  Mammogram: Ordered today. Pt advised to call and schedule apt.  Bone Density: Previous DEXA scan was normal. No repeat needed unless advised by a physician. Recommended yearly ophthalmology/optometry visit for glaucoma screening and checkup Recommended yearly dental visit for hygiene and checkup  Vaccinations: Influenza vaccine: Currently due. Pt to receive at pharmacy or next in office apt.  Pneumococcal vaccine: Completed series Tdap vaccine: Up to date, due 08/2025 Shingles vaccine: Shingrix discussed. Please contact your pharmacy for coverage information.     Advanced directives: Please bring a copy of your POA (Power of Attorney) and/or Living Will to your next appointment.   Conditions/risks identified: Recommend to eat 3 small meals a day with 2 healthy snacks in between.  Next appointment: 03/27/20 @ 8:00 AM with Dr [name]    Preventive Care 65 Years and Older, Female Preventive care refers to lifestyle choices and visits with your health care provider that can promote health and wellness. What does preventive care include?  A yearly physical exam. This is also called an annual well check.  Dental exams once or twice a year.  Routine eye exams. Ask your health care provider how often you should have your eyes checked.  Personal lifestyle choices, including:  Daily care of your teeth and gums.  Regular physical activity.  Eating a healthy diet.  Avoiding tobacco and drug use.  Limiting alcohol use.  Practicing safe sex.  Taking low-dose aspirin every day.  Taking vitamin and mineral supplements as recommended by your health care provider. What happens  during an annual well check? The services and screenings done by your health care provider during your annual well check will depend on your age, overall health, lifestyle risk factors, and family history of disease. Counseling  Your health care provider may ask you questions about your:  Alcohol use.  Tobacco use.  Drug use.  Emotional well-being.  Home and relationship well-being.  Sexual activity.  Eating habits.  History of falls.  Memory and ability to understand (cognition).  Work and work Statistician.  Reproductive health. Screening  You may have the following tests or measurements:  Height, weight, and BMI.  Blood pressure.  Lipid and cholesterol levels. These may be checked every 5 years, or more frequently if you are over 8 years old.  Skin check.  Lung cancer screening. You may have this screening every year starting at age 11 if you have a 30-pack-year history of smoking and currently smoke or have quit within the past 15 years.  Fecal occult blood test (FOBT) of the stool. You may have this test every year starting at age 36.  Flexible sigmoidoscopy or colonoscopy. You may have a sigmoidoscopy every 5 years or a colonoscopy every 10 years starting at age 43.  Hepatitis C blood test.  Hepatitis B blood test.  Sexually transmitted disease (STD) testing.  Diabetes screening. This is done by checking your blood sugar (glucose) after you have not eaten for a while (fasting). You may have this done every 1-3 years.  Bone density scan. This is done to screen for osteoporosis. You may have this done starting at age 76.  Mammogram. This may be done every 1-2 years. Talk to  your health care provider about how often you should have regular mammograms. Talk with your health care provider about your test results, treatment options, and if necessary, the need for more tests. Vaccines  Your health care provider may recommend certain vaccines, such  as:  Influenza vaccine. This is recommended every year.  Tetanus, diphtheria, and acellular pertussis (Tdap, Td) vaccine. You may need a Td booster every 10 years.  Zoster vaccine. You may need this after age 8.  Pneumococcal 13-valent conjugate (PCV13) vaccine. One dose is recommended after age 77.  Pneumococcal polysaccharide (PPSV23) vaccine. One dose is recommended after age 45. Talk to your health care provider about which screenings and vaccines you need and how often you need them. This information is not intended to replace advice given to you by your health care provider. Make sure you discuss any questions you have with your health care provider. Document Released: 06/07/2015 Document Revised: 01/29/2016 Document Reviewed: 03/12/2015 Elsevier Interactive Patient Education  2017 [place] Prevention in the Home Falls can cause injuries. They can happen to people of all ages. There are many things you can do to make your home safe and to help prevent falls. What can I do on the outside of my home?  Regularly fix the edges of walkways and driveways and fix any cracks.  Remove anything that might make you trip as you walk through a door, such as a raised step or threshold.  Trim any bushes or trees on the path to your home.  Use bright outdoor lighting.  Clear any walking paths of anything that might make someone trip, such as rocks or tools.  Regularly check to see if handrails are loose or broken. Make sure that both sides of any steps have handrails.  Any raised decks and porches should have guardrails on the edges.  Have any leaves, snow, or ice cleared regularly.  Use sand or salt on walking paths during winter.  Clean up any spills in your garage right away. This includes oil or grease spills. What can I do in the bathroom?  Use night lights.  Install grab bars by the toilet and in the tub and shower. Do not use towel bars as grab bars.  Use  non-skid mats or decals in the tub or shower.  If you need to sit down in the shower, use a plastic, non-slip stool.  Keep the floor dry. Clean up any water that spills on the floor as soon as it happens.  Remove soap buildup in the tub or shower regularly.  Attach bath mats securely with double-sided non-slip rug tape.  Do not have throw rugs and other things on the floor that can make you trip. What can I do in the bedroom?  Use night lights.  Make sure that you have a light by your bed that is easy to reach.  Do not use any sheets or blankets that are too big for your bed. They should not hang down onto the floor.  Have a firm chair that has side arms. You can use this for support while you get dressed.  Do not have throw rugs and other things on the floor that can make you trip. What can I do in the kitchen?  Clean up any spills right away.  Avoid walking on wet floors.  Keep items that you use a lot in easy-to-reach places.  If you need to reach something above you, use a strong step stool that has  a grab bar.  Keep electrical cords out of the way.  Do not use floor polish or wax that makes floors slippery. If you must use wax, use non-skid floor wax.  Do not have throw rugs and other things on the floor that can make you trip. What can I do with my stairs?  Do not leave any items on the stairs.  Make sure that there are handrails on both sides of the stairs and use them. Fix handrails that are broken or loose. Make sure that handrails are as long as the stairways.  Check any carpeting to make sure that it is firmly attached to the stairs. Fix any carpet that is loose or worn.  Avoid having throw rugs at the top or bottom of the stairs. If you do have throw rugs, attach them to the floor with carpet tape.  Make sure that you have a light switch at the top of the stairs and the bottom of the stairs. If you do not have them, ask someone to add them for you. What  else can I do to help prevent falls?  Wear shoes that:  Do not have high heels.  Have rubber bottoms.  Are comfortable and fit you well.  Are closed at the toe. Do not wear sandals.  If you use a stepladder:  Make sure that it is fully opened. Do not climb a closed stepladder.  Make sure that both sides of the stepladder are locked into place.  Ask someone to hold it for you, if possible.  Clearly mark and make sure that you can see:  Any grab bars or handrails.  First and last steps.  Where the edge of each step is.  Use tools that help you move around (mobility aids) if they are needed. These include:  Canes.  Walkers.  Scooters.  Crutches.  Turn on the lights when you go into a dark area. Replace any light bulbs as soon as they burn out.  Set up your furniture so you have a clear path. Avoid moving your furniture around.  If any of your floors are uneven, fix them.  If there are any pets around you, be aware of where they are.  Review your medicines with your doctor. Some medicines can make you feel dizzy. This can increase your chance of falling. Ask your doctor what other things that you can do to help prevent falls. This information is not intended to replace advice given to you by your health care provider. Make sure you discuss any questions you have with your health care provider. Document Released: 03/07/2009 Document Revised: 10/17/2015 Document Reviewed: 06/15/2014 Elsevier Interactive Patient Education  2017 [employer].

## 2020-02-27 NOTE — Telephone Encounter (Signed)
Requested medication (s) are due for refill today: yes  Requested medication (s) are on the active medication list: yes  Last refill:  01/17/20  Future visit scheduled: yes  Notes to clinic:  not delegate   Requested Prescriptions  Pending Prescriptions Disp Refills   zolpidem (AMBIEN) 5 MG tablet [Pharmacy Med Name: ZOLPIDEM TARTRATE 5 MG TAB] 30 tablet     Sig: TAKE ONE TABLET BY MOUTH AT BEDTIME AS NEEDED FOR SLEEP      Not Delegated - Psychiatry:  Anxiolytics/Hypnotics Failed - 02/27/2020  8:56 AM      Failed - This refill cannot be delegated      Failed - Urine Drug Screen completed in last 360 days.      Passed - Valid encounter within last 6 months    Recent Outpatient Visits           1 month ago Cough   [facility] [name], MD   2 months ago Venous insufficiency of right leg   [facility] [name], MD   2 months ago Cellulitis of right lower extremity   [facility] [name], MD   1 year ago Back pain, unspecified back location, unspecified back pain laterality, unspecified chronicity   [facility] [name], MD   1 year ago Chest pain, unspecified type   [facility] [name], MD       Future Appointments             In 1 week Ralene Bathe, MD [place]   In 4 weeks [name], MD [facility]             Signed Prescriptions Disp Refills   sertraline (ZOLOFT) 25 MG tablet 30 tablet 0    Sig: TAKE 1 TABLET BY MOUTH DAILY      Psychiatry:  Antidepressants - SSRI Failed - 02/27/2020  8:56 AM      Failed - Completed PHQ-2 or PHQ-9 in the last 360 days.      Passed - Valid encounter within last 6 months    Recent Outpatient Visits           1 month ago Cough   [facility] [name], MD   2 months ago Venous insufficiency of right leg    [facility] [name], MD   2 months ago Cellulitis of right lower extremity   [facility] [name], MD   1 year ago Back pain, unspecified back location, unspecified back pain laterality, unspecified chronicity   [facility] [name], MD   1 year ago Chest pain, unspecified type   [facility] [name], MD       Future Appointments             In 1 week Ralene Bathe, MD [place]   In 4 weeks [name], MD [facility]              levothyroxine (SYNTHROID) 100 MCG tablet 90 tablet 3    Sig: TAKE ONE TABLET BY MOUTH EVERY DAY      Endocrinology:  Hypothyroid Agents Failed - 02/27/2020  8:56 AM      Failed - TSH needs to be rechecked within 3 months after an  abnormal result. Refill until TSH is due.      Passed - TSH in normal range and within 360 days    TSH  Date Value Ref Range Status  12/26/2019 3.750 0.450 - 4.500 uIU/mL Final          Passed - Valid encounter within last 12 months    Recent Outpatient Visits           1 month ago Cough   [facility] [name], MD   2 months ago Venous insufficiency of right leg   [facility] [name], MD   2 months ago Cellulitis of right lower extremity   [facility] [name], MD   1 year ago Back pain, unspecified back location, unspecified back pain laterality, unspecified chronicity   [facility] [name], MD   1 year ago Chest pain, unspecified type   [facility] [name], MD       Future Appointments             In 1 week Ralene Bathe, MD [place]   In 4 weeks [name], MD [facility]

## 2020-03-04 DIAGNOSIS — C4402 Squamous cell carcinoma of skin of lip: Secondary | ICD-10-CM | POA: Diagnosis not present

## 2020-03-06 ENCOUNTER — Other Ambulatory Visit: Payer: Self-pay

## 2020-03-06 ENCOUNTER — Encounter: Payer: Self-pay | Admitting: Dermatology

## 2020-03-06 ENCOUNTER — Ambulatory Visit (INDEPENDENT_AMBULATORY_CARE_PROVIDER_SITE_OTHER): Payer: Medicare Other | Admitting: Dermatology

## 2020-03-06 DIAGNOSIS — I25709 Atherosclerosis of coronary artery bypass graft(s), unspecified, with unspecified angina pectoris: Secondary | ICD-10-CM | POA: Diagnosis not present

## 2020-03-06 DIAGNOSIS — L820 Inflamed seborrheic keratosis: Secondary | ICD-10-CM | POA: Diagnosis not present

## 2020-03-06 DIAGNOSIS — L578 Other skin changes due to chronic exposure to nonionizing radiation: Secondary | ICD-10-CM

## 2020-03-06 DIAGNOSIS — Z85828 Personal history of other malignant neoplasm of skin: Secondary | ICD-10-CM | POA: Diagnosis not present

## 2020-03-06 DIAGNOSIS — D692 Other nonthrombocytopenic purpura: Secondary | ICD-10-CM | POA: Diagnosis not present

## 2020-03-06 DIAGNOSIS — D1800 Hemangioma unspecified site: Secondary | ICD-10-CM | POA: Diagnosis not present

## 2020-03-06 DIAGNOSIS — L821 Other seborrheic keratosis: Secondary | ICD-10-CM | POA: Diagnosis not present

## 2020-03-06 NOTE — Progress Notes (Signed)
   Follow-Up Visit   Subjective  Stacey [name] is a 79 y.o. female who presents for the following: Hx of SCC (L upper lip vermillion border extending onto vermillion adjacent to previous SCC scar, [facility] 10/02/19), Actinic Keratosis (face, 49m f/u), and sore (R arm ~35m, itchy, pt tried otc antifungal).  The following portions of the chart were reviewed this encounter and updated as appropriate:  Tobacco  Allergies  Meds  Problems  Med Hx  Surg Hx  Fam Hx     Review of Systems:  No other skin or systemic complaints except as noted in HPI or Assessment and Plan.  Objective  Well appearing patient in no apparent distress; mood and affect are within normal limits.  A focused examination was performed including face, right arm. Relevant physical exam findings are noted in the Assessment and Plan.  Objective  L upper lip vermillion border extending onto vermillion adjacent to previous SCC scar: Well healed scar with no evidence of recurrence, no lymphadenopathy.   Objective  Right forearm x 1: Erythematous keratotic or waxy stuck-on papule or plaque.    Assessment & Plan    Hemangiomas - Red papules - Discussed benign nature - Observe - Call for any changes  Seborrheic Keratoses - Stuck-on, waxy, tan-brown papules and plaques  - Discussed benign etiology and prognosis. - Observe - Call for any changes  Actinic Damage - diffuse scaly erythematous macules with underlying dyspigmentation - Recommend daily broad spectrum sunscreen SPF 30+ to sun-exposed areas, reapply every 2 hours as needed.  - Call for new or changing lesions.  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 Lacinda Axon 10/02/19  Clear. Observe for recurrence. Call clinic for new or changing lesions.  Recommend regular skin exams, daily broad-spectrum spf 30+ sunscreen use, and photoprotection.     Inflamed seborrheic keratosis Right forearm x 1  Destruction of lesion - Right forearm x 1 Complexity: simple   Destruction method: cryotherapy   Informed consent: discussed and consent obtained   Timeout:  patient name, date of birth, surgical site, and procedure verified Lesion destroyed using liquid nitrogen: Yes   Region frozen until ice ball extended beyond lesion: Yes   Outcome: patient tolerated procedure well with no complications   Post-procedure details: wound care instructions given    Return in about 6 months (around 09/04/2020) for TBSE hx of SCC, [place].   I, Sonya Hupman, RMA, am acting as scribe for Sarina Ser, MD .  Documentation: I have reviewed the above documentation for accuracy and completeness, and I agree with the above.  Sarina Ser, MD

## 2020-03-07 ENCOUNTER — Encounter: Payer: Self-pay | Admitting: Dermatology

## 2020-03-19 ENCOUNTER — Telehealth: Payer: Self-pay

## 2020-03-19 NOTE — Telephone Encounter (Signed)
Copied from [place] [id number]. Topic: General - Other >> Mar 19, 2020 10:54 AM Keene Breath wrote: Reason for CRM: Patient's husband would like the nurse or doctor to call him regarding patient's back pain.  He stated that she wants a referral and does not want to go to [place].  Please call him to discuss at [phone]

## 2020-03-19 NOTE — Telephone Encounter (Signed)
Please advise 

## 2020-03-21 NOTE — Telephone Encounter (Signed)
We can talk to the patient about this but not the husband.  I suggested an office visit to discuss options.  I can refer to a back surgeon and [facility] if that is what she wishes.

## 2020-03-21 NOTE — Telephone Encounter (Signed)
Patient's husband was advised. Albertina Parr stated he will speak to his wife and than let us know about an appt.

## 2020-03-23 ENCOUNTER — Other Ambulatory Visit: Payer: Self-pay | Admitting: Family Medicine

## 2020-03-23 DIAGNOSIS — F411 Generalized anxiety disorder: Secondary | ICD-10-CM

## 2020-03-23 NOTE — Telephone Encounter (Signed)
Requested Prescriptions  Pending Prescriptions Disp Refills  . sertraline (ZOLOFT) 25 MG tablet [Pharmacy Med Name: SERTRALINE HCL 25 MG TAB] 90 tablet 0    Sig: TAKE 1 TABLET BY MOUTH DAILY     Psychiatry:  Antidepressants - SSRI Passed - 03/23/2020  9:25 AM      Passed - Completed PHQ-2 or PHQ-9 in the last 360 days      Passed - Valid encounter within last 6 months    Recent Outpatient Visits          2 months ago Cough   [facility] [name], MD   2 months ago Venous insufficiency of right leg   [facility] [name], MD   3 months ago Cellulitis of right lower extremity   [facility] [name], MD   1 year ago Back pain, unspecified back location, unspecified back pain laterality, unspecified chronicity   [facility] [name], MD   1 year ago Chest pain, unspecified type   [facility] [name], MD      Future Appointments            In 1 week [name], MD [facility] [place]

## 2020-03-27 ENCOUNTER — Ambulatory Visit: Payer: Self-pay | Admitting: Family Medicine

## 2020-04-04 ENCOUNTER — Other Ambulatory Visit: Payer: Self-pay

## 2020-04-04 ENCOUNTER — Encounter: Payer: Self-pay | Admitting: Family Medicine

## 2020-04-04 ENCOUNTER — Ambulatory Visit (INDEPENDENT_AMBULATORY_CARE_PROVIDER_SITE_OTHER): Payer: Medicare Other | Admitting: Family Medicine

## 2020-04-04 VITALS — BP 105/67 | HR 65 | Temp 98.0°F | Resp 16 | Ht 62.0 in | Wt 187.0 lb

## 2020-04-04 DIAGNOSIS — Z951 Presence of aortocoronary bypass graft: Secondary | ICD-10-CM

## 2020-04-04 DIAGNOSIS — M5441 Lumbago with sciatica, right side: Secondary | ICD-10-CM | POA: Diagnosis not present

## 2020-04-04 DIAGNOSIS — E039 Hypothyroidism, unspecified: Secondary | ICD-10-CM | POA: Diagnosis not present

## 2020-04-04 DIAGNOSIS — I10 Essential (primary) hypertension: Secondary | ICD-10-CM

## 2020-04-04 DIAGNOSIS — F324 Major depressive disorder, single episode, in partial remission: Secondary | ICD-10-CM

## 2020-04-04 DIAGNOSIS — I25709 Atherosclerosis of coronary artery bypass graft(s), unspecified, with unspecified angina pectoris: Secondary | ICD-10-CM

## 2020-04-04 MED ORDER — CHLORTHALIDONE 25 MG PO TABS: 12.5000 mg | ORAL_TABLET | Freq: Every day | ORAL | 1 refills | Status: DC

## 2020-04-04 NOTE — Patient Instructions (Signed)
Try OTC Voltern Gel and Icy hot for back pain. Discontinue HCTZ.

## 2020-04-04 NOTE — Progress Notes (Signed)
Established patient visit   Patient: Stacey [name]   DOB: Oct 20, 1940   79 y.o. Female  MRN: [id number] Visit Date: 04/04/2020  Today's healthcare provider: Wilhemena Durie, MD   Chief Complaint  Patient presents with  . Back Pain  . Hyperlipidemia  . Hypertension  . Venous Insufficiency   Subjective    HPI  Patient's main complaint today is 1 of low back pain with radiation down the right leg.  SHe states physical therapy is made it worse.  She has been going recently. Hypertension, follow-up  BP Readings from Last 3 Encounters:  04/04/20 105/67  01/19/20 (!) 146/84  12/26/19 121/78   Wt Readings from Last 3 Encounters:  04/04/20 187 lb (84.8 kg)  01/19/20 186 lb (84.4 kg)  12/26/19 189 lb 12.8 oz (86.1 kg)     She was last seen for hypertension 3 months ago.  BP at that visit was 121/78. Management since that visit includes; Good control. She reports good compliance with treatment. She is not having side effects.  She is not exercising. She is adherent to low salt diet.   Outside blood pressures are checked occasionally.  She does not smoke.  Use of agents associated with hypertension: none.   Lipid/Cholesterol, follow-up  Last Lipid Panel: Lab Results  Component Value Date   CHOL 172 12/26/2019   LDLCALC 89 12/26/2019   HDL 65 12/26/2019   TRIG 100 12/26/2019    She was last seen for this 3 months ago.  Management since that visit includes; labs checked showing-stable. She reports good compliance with treatment. She is not having side effects.  She is following a Regular diet. Current exercise: none  Last metabolic panel Lab Results  Component Value Date   GLUCOSE 122 (H) 12/26/2019   NA 137 12/26/2019   K 4.2 12/26/2019   BUN 17 12/26/2019   CREATININE 1.03 (H) 12/26/2019   GFRNONAA 53 (L) 12/26/2019   GFRAA 61 12/26/2019   CALCIUM 9.2 12/26/2019   AST 19 12/26/2019   ALT 14 12/26/2019   The ASCVD Risk score (Goff DC Jr., et  al., 2013) failed to calculate for the following reasons:   The patient has a prior MI or stroke diagnosis   Venous insufficiency of right leg From 12/26/2019-I think most of this is aging and status post veinous grafting for bypass graft.  Rule out DVT with Dopplers and refer to vascular.  Patient is not wearing support hose which I encouraged.  I do not think diuretics would prove useful. Check with vascular to make sure there is no other underlying issue.  Cellulitis of right lower extremity From 12/26/2019-No infection today. Referred to Vascular Surgery.  Class 1 obesity due to excess calories with serious comorbidity and body mass index (BMI) of 34.0 to 34.9 in adult From 12/26/2019-Patient has gained 20 pounds over the past year.  Might benefit from weight watchers. She has CAD hypertension hyperlipidemia related to obesity.  Back Pain Patient also mentions that she is having severe back pain. She is requesting to be referred to ortho for this. Reports that this is a chronic issue that has gotten worse over the last few weeks.       Medications: Outpatient Medications Prior to Visit  Medication Sig  . metoprolol tartrate (LOPRESSOR) 25 MG tablet ONE AND ONE-HALF TABLETS BY MOUTH TWICE DAILY  . acetaminophen (TYLENOL) 325 MG tablet Take 2 tablets (650 mg total) by mouth every 6 (six) hours as  needed for mild pain.  Marland Kitchen albuterol (VENTOLIN HFA) 108 (90 Base) MCG/ACT inhaler INHALE 2 PUFFS BY MOUTH EVERY 4 TO 6 HOURS AS NEEDED FOR SHORTNESS OF BREATH  . amLODipine (NORVASC) 5 MG tablet Take 1 tablet (5 mg total) daily by mouth.  Marland Kitchen apixaban (ELIQUIS) 5 MG TABS tablet Take 1 tablet (5 mg total) by mouth 2 (two) times daily.  Marland Kitchen ascorbic acid (VITAMIN C) 500 MG tablet Take 2,000 mg by mouth daily.   Marland Kitchen aspirin 81 MG tablet Take 1 tablet (81 mg total) by mouth daily.  Marland Kitchen buPROPion (WELLBUTRIN XL) 300 MG 24 hr tablet TAKE ONE TABLET BY MOUTH EVERY DAY  . Cholecalciferol (VITAMIN D) 2000 units  tablet Take 2,000 Units by mouth daily.  . clindamycin (CLEOCIN) 150 MG capsule TAKE 1 CAPSULE 3 TIMES DAILY UNTIL FINISHED (Patient not taking: Reported on 01/19/2020)  . Cyanocobalamin (B-12) 500 MCG TABS Take 500 mcg by mouth daily.   . diazepam (VALIUM) 5 MG tablet Take 1 tablet (5 mg total) by mouth every 12 (twelve) hours as needed for anxiety. (Patient taking differently: Take 5 mg by mouth every 12 (twelve) hours as needed for anxiety. Takes 1/2 tablet)  . doxycycline (VIBRA-TABS) 100 MG tablet Take 1 tablet (100 mg total) by mouth 2 (two) times daily. (Patient not taking: Reported on 02/27/2020)  . ELDERBERRY PO Take by mouth daily. Unsure dose / combination vitamin  . EPINEPHrine (EPIPEN 2-PAK) 0.3 mg/0.3 mL IJ SOAJ injection INJECT AS DIRECTED FOR SEVERE ALLERGIC REACTION (Patient taking differently: Inject 0.3 mg into the muscle as needed (for severe allergic reaction). )  . fluocinonide gel (LIDEX) 0.96 % Apply 1 application topically as needed.  . Garlic 045 MG TABS Take 500 mg by mouth daily. Every other day (Patient not taking: Reported on 02/27/2020)  . hydrochlorothiazide (HYDRODIURIL) 12.5 MG tablet TAKE ONE TABLET EVERY DAY  . levothyroxine (SYNTHROID) 100 MCG tablet TAKE ONE TABLET BY MOUTH EVERY DAY  . lisinopril (PRINIVIL,ZESTRIL) 40 MG tablet TAKE 1 TABLET BY MOUTH EVERY DAY (Patient not taking: Reported on 11/30/2019)  . metoprolol tartrate 37.5 MG TABS Take 37.5 mg by mouth 2 (two) times daily. (Patient not taking: Reported on 02/27/2020)  . Multiple Vitamin (MULTI-VITAMINS) TABS Take 1 tablet by mouth daily. (Patient not taking: Reported on 02/27/2020)  . nystatin (MYCOSTATIN) 100000 UNIT/ML suspension Take 100,000 Units by mouth 3 (three) times daily as needed. For mouth ulcers.  Marland Kitchen oxymetazoline (AFRIN) 0.05 % nasal spray Place 1 spray into both nostrils as needed for congestion.  . rosuvastatin (CRESTOR) 20 MG tablet TAKE ONE TABLET (20 MG) BY MOUTH EVERY EVENING  . sertraline  (ZOLOFT) 25 MG tablet TAKE 1 TABLET BY MOUTH DAILY  . tacrolimus (PROTOPIC) 0.1 % ointment As needed for rosacea flare ups  . traMADol (ULTRAM) 50 MG tablet Take 1 tablet (50 mg total) by mouth every 6 (six) hours as needed for moderate pain.  Marland Kitchen zinc gluconate 50 MG tablet Take 50 mg by mouth daily.   Marland Kitchen zolpidem (AMBIEN) 5 MG tablet TAKE ONE TABLET BY MOUTH AT BEDTIME AS NEEDED FOR SLEEP   No facility-administered medications prior to visit.    Review of Systems  Constitutional: Negative for appetite change, chills, fatigue and fever.  Respiratory: Negative for chest tightness and shortness of breath.   Cardiovascular: Negative for chest pain and palpitations.  Gastrointestinal: Negative for abdominal pain, nausea and vomiting.  Musculoskeletal: Positive for arthralgias and back pain.  Neurological: Negative for  dizziness and weakness.       Objective    BP 105/67   Pulse 65   Temp 98 F (36.7 C)   Resp 16   Ht 5\' 2"  (1.575 m)   Wt 187 lb (84.8 kg)   BMI 34.20 kg/m  BP Readings from Last 3 Encounters:  04/04/20 105/67  01/19/20 (!) 146/84  12/26/19 121/78   Wt Readings from Last 3 Encounters:  04/04/20 187 lb (84.8 kg)  01/19/20 186 lb (84.4 kg)  12/26/19 189 lb 12.8 oz (86.1 kg)      Physical Exam Vitals reviewed.  Constitutional:      Appearance: She is well-developed.  HENT:     Head: Normocephalic and atraumatic.     Right Ear: External ear normal.     Left Ear: External ear normal.     Nose: Nose normal.  Eyes:     General: No scleral icterus.    Conjunctiva/sclera: Conjunctivae normal.  Neck:     Thyroid: No thyromegaly.  Cardiovascular:     Rate and Rhythm: Normal rate and regular rhythm.     Heart sounds: Normal heart sounds.  Pulmonary:     Effort: Pulmonary effort is normal.     Breath sounds: Normal breath sounds.  Abdominal:     Palpations: Abdomen is soft.  Skin:    General: Skin is warm and dry.  Neurological:     Mental Status: She is  alert and oriented to person, place, and time. Mental status is at baseline.     Comments: Straight leg raise is positive on the right and she has 5-/5+ strength in the right leg  Psychiatric:        Mood and Affect: Mood normal.        Behavior: Behavior normal.        Thought Content: Thought content normal.        Judgment: Judgment normal.       No results found for any visits on 04/04/20.  Assessment & Plan     1. Essential hypertension Stop HCTZ and try chlorthalidone - chlorthalidone (HYGROTON) 25 MG tablet; Take 0.5 tablets (12.5 mg total) by mouth daily.  Dispense: 30 tablet; Refill: 1  2. Low back pain with right-sided sciatica, unspecified back pain laterality, unspecified chronicity This is worsening sciatica.  At this time obtain MRI of LS spine and try acupuncture while awaiting results.  He has failed recent physical therapy.  She has weakness in her right leg and will need neurosurgical referral probably.  Follow-up in 1 month - MR LUMBAR SPINE W WO CONTRAST; Future  3. S/P CABG x 4 All risk factors treated  4. Adult hypothyroidism   5. Major depressive disorder with single episode, in partial remission ([place]) On low-dose sertraline   No follow-ups on file.         Tashunda Vandezande Cranford Mon, MD  [facility] [phone] (phone) [phone] (fax)  [place]

## 2020-04-05 ENCOUNTER — Telehealth: Payer: Self-pay

## 2020-04-05 NOTE — Telephone Encounter (Signed)
Copied from [place] [phone]. Topic: General - Other >> Apr 05, 2020  3:11 PM Yvette Rack wrote: Reason for CRM: Pt husband stated they just missed the call from [place] regarding questions for mri. Pt husband stated the answers to the questions are: that pt does not have any artificial body parts, she takes Eliquis, and she has not ever had any covid symptoms.

## 2020-04-12 ENCOUNTER — Ambulatory Visit (INDEPENDENT_AMBULATORY_CARE_PROVIDER_SITE_OTHER): Payer: Medicare Other | Admitting: Vascular Surgery

## 2020-04-19 DIAGNOSIS — M4726 Other spondylosis with radiculopathy, lumbar region: Secondary | ICD-10-CM | POA: Diagnosis not present

## 2020-04-19 DIAGNOSIS — M5116 Intervertebral disc disorders with radiculopathy, lumbar region: Secondary | ICD-10-CM | POA: Diagnosis not present

## 2020-04-19 DIAGNOSIS — M5117 Intervertebral disc disorders with radiculopathy, lumbosacral region: Secondary | ICD-10-CM | POA: Diagnosis not present

## 2020-04-19 DIAGNOSIS — M5115 Intervertebral disc disorders with radiculopathy, thoracolumbar region: Secondary | ICD-10-CM | POA: Diagnosis not present

## 2020-04-19 DIAGNOSIS — M4727 Other spondylosis with radiculopathy, lumbosacral region: Secondary | ICD-10-CM | POA: Diagnosis not present

## 2020-04-19 DIAGNOSIS — M48061 Spinal stenosis, lumbar region without neurogenic claudication: Secondary | ICD-10-CM | POA: Diagnosis not present

## 2020-04-22 ENCOUNTER — Telehealth: Payer: Self-pay

## 2020-04-22 NOTE — Telephone Encounter (Signed)
Patient had her MRI done on 04/19/20 at [facility].   She is asking to please get results and if needed to please send her to a doctor in [place].

## 2020-04-22 NOTE — Telephone Encounter (Signed)
Please advise results? Can see results in Care everywhere.

## 2020-04-23 NOTE — Telephone Encounter (Signed)
I called patient and left message on cell phone.  Please refer to Dr. Arnoldo Morale with neurosurgery in [place].  Thank you

## 2020-04-24 ENCOUNTER — Telehealth: Payer: Self-pay

## 2020-04-24 NOTE — Telephone Encounter (Signed)
Copied from [place] [id number]. Topic: General - Other >> Apr 24, 2020  8:55 AM Leward Quan A wrote: Reason for CRM: Patient husband called in to inform Dr [name] to go ahead and do what he has to so she can see Dr Arnoldo Morale in [place]. Any questions please call Ph# [phone]

## 2020-04-26 ENCOUNTER — Other Ambulatory Visit: Payer: Self-pay | Admitting: *Deleted

## 2020-04-26 DIAGNOSIS — M5441 Lumbago with sciatica, right side: Secondary | ICD-10-CM

## 2020-04-26 NOTE — Telephone Encounter (Signed)
Referral has been placed. 

## 2020-04-29 ENCOUNTER — Other Ambulatory Visit: Payer: Self-pay | Admitting: Family Medicine

## 2020-04-29 DIAGNOSIS — I10 Essential (primary) hypertension: Secondary | ICD-10-CM

## 2020-05-01 ENCOUNTER — Telehealth: Payer: Self-pay

## 2020-05-01 NOTE — Telephone Encounter (Signed)
Copied from [place] [id number]. Topic: General - Other >> May 01, 2020  8:07 AM Keene Breath wrote: Reason for CRM: Patient called to inform the doctor that she needs to bring with her to the appt. For her MRI a written report that they are requesting.  Please advise and call to discuss at [phone] or [phone]

## 2020-05-01 NOTE — Telephone Encounter (Signed)
Has appointment with Dr. Arnoldo Morale on Tuesday. Needs the results for MRI. Printed results out for patient.

## 2020-05-03 ENCOUNTER — Ambulatory Visit (INDEPENDENT_AMBULATORY_CARE_PROVIDER_SITE_OTHER): Payer: Medicare Other | Admitting: Vascular Surgery

## 2020-05-07 DIAGNOSIS — G8929 Other chronic pain: Secondary | ICD-10-CM | POA: Diagnosis not present

## 2020-05-07 DIAGNOSIS — Z6834 Body mass index (BMI) 34.0-34.9, adult: Secondary | ICD-10-CM | POA: Diagnosis not present

## 2020-05-07 DIAGNOSIS — M47816 Spondylosis without myelopathy or radiculopathy, lumbar region: Secondary | ICD-10-CM | POA: Diagnosis not present

## 2020-05-07 DIAGNOSIS — M5441 Lumbago with sciatica, right side: Secondary | ICD-10-CM | POA: Diagnosis not present

## 2020-05-07 DIAGNOSIS — M25551 Pain in right hip: Secondary | ICD-10-CM | POA: Diagnosis not present

## 2020-05-08 ENCOUNTER — Ambulatory Visit: Payer: Self-pay | Admitting: Family Medicine

## 2020-06-07 ENCOUNTER — Ambulatory Visit (INDEPENDENT_AMBULATORY_CARE_PROVIDER_SITE_OTHER): Payer: Medicare Other | Admitting: Vascular Surgery

## 2020-06-11 ENCOUNTER — Ambulatory Visit (INDEPENDENT_AMBULATORY_CARE_PROVIDER_SITE_OTHER): Payer: Medicare Other | Admitting: Vascular Surgery

## 2020-06-11 ENCOUNTER — Other Ambulatory Visit: Payer: Self-pay

## 2020-06-11 ENCOUNTER — Encounter (INDEPENDENT_AMBULATORY_CARE_PROVIDER_SITE_OTHER): Payer: Self-pay | Admitting: Vascular Surgery

## 2020-06-11 VITALS — BP 156/80 | HR 120 | Ht 62.0 in | Wt 193.0 lb

## 2020-06-11 DIAGNOSIS — I890 Lymphedema, not elsewhere classified: Secondary | ICD-10-CM

## 2020-06-11 DIAGNOSIS — E785 Hyperlipidemia, unspecified: Secondary | ICD-10-CM

## 2020-06-11 DIAGNOSIS — I25709 Atherosclerosis of coronary artery bypass graft(s), unspecified, with unspecified angina pectoris: Secondary | ICD-10-CM | POA: Diagnosis not present

## 2020-06-11 DIAGNOSIS — I10 Essential (primary) hypertension: Secondary | ICD-10-CM | POA: Diagnosis not present

## 2020-06-11 NOTE — Assessment & Plan Note (Signed)
Symptoms are overall fairly well controlled.  If her symptoms worsen, she needs to use her lymphedema pump and her compression stockings more reliably.  I am going to stretch her out and see her in 6 months at this point.

## 2020-06-11 NOTE — Progress Notes (Signed)
MRN : [id number]  Stacey [name] is a 80 y.o. (09/28/1940) female who presents with chief complaint of  Chief Complaint  Patient presents with  . Follow-up    3 Mo no stusies  .  History of Present Illness: Patient returns today in follow up of her leg swelling and lymphedema.  Since her last visit, her swelling has been better.  She has not been using her lymphedema pump all that regularly because her swelling has not been that bad.  She reports no ulceration or infection.  Current Outpatient Medications  Medication Sig Dispense Refill  . acetaminophen (TYLENOL) 325 MG tablet Take 2 tablets (650 mg total) by mouth every 6 (six) hours as needed for mild pain.    Marland Kitchen albuterol (VENTOLIN HFA) 108 (90 Base) MCG/ACT inhaler INHALE 2 PUFFS BY MOUTH EVERY 4 TO 6 HOURS AS NEEDED FOR SHORTNESS OF BREATH 18 Inhaler 5  . amLODipine (NORVASC) 5 MG tablet Take 1 tablet (5 mg total) daily by mouth. 90 tablet 3  . apixaban (ELIQUIS) 5 MG TABS tablet Take 1 tablet (5 mg total) by mouth 2 (two) times daily. 60 tablet 2  . ascorbic acid (VITAMIN C) 500 MG tablet Take 2,000 mg by mouth daily.     Marland Kitchen aspirin 81 MG tablet Take 1 tablet (81 mg total) by mouth daily. 30 tablet 2  . buPROPion (WELLBUTRIN XL) 300 MG 24 hr tablet TAKE ONE TABLET BY MOUTH EVERY DAY 90 tablet 3  . chlorthalidone (HYGROTON) 25 MG tablet Take 0.5 tablets (12.5 mg total) by mouth daily. 30 tablet 1  . Cholecalciferol (VITAMIN D) 2000 units tablet Take 2,000 Units by mouth daily.    . clindamycin (CLEOCIN) 150 MG capsule TAKE 1 CAPSULE 3 TIMES DAILY UNTIL FINISHED 21 capsule 0  . Cyanocobalamin (B-12) 500 MCG TABS Take 500 mcg by mouth daily.     . diazepam (VALIUM) 5 MG tablet Take 1 tablet (5 mg total) by mouth every 12 (twelve) hours as needed for anxiety. (Patient taking differently: Take 5 mg by mouth every 12 (twelve) hours as needed for anxiety. Takes 1/2 tablet) 60 tablet 1  . doxycycline (VIBRA-TABS) 100 MG tablet Take 1  tablet (100 mg total) by mouth 2 (two) times daily. 14 tablet 1  . ELDERBERRY PO Take by mouth daily. Unsure dose / combination vitamin    . EPINEPHrine (EPIPEN 2-PAK) 0.3 mg/0.3 mL IJ SOAJ injection INJECT AS DIRECTED FOR SEVERE ALLERGIC REACTION (Patient taking differently: Inject 0.3 mg into the muscle as needed (for severe allergic reaction).) 1 Device 1  . fluocinonide gel (LIDEX) [id number] % Apply 1 application topically as needed.    . Garlic [id number] MG TABS Take 500 mg by mouth daily. Every other day    . isosorbide mononitrate (IMDUR) 30 MG 24 hr tablet Take 30 mg by mouth daily.    Marland Kitchen levothyroxine (SYNTHROID) 100 MCG tablet TAKE ONE TABLET BY MOUTH EVERY DAY 90 tablet 3  . lisinopril (PRINIVIL,ZESTRIL) 40 MG tablet TAKE 1 TABLET BY MOUTH EVERY DAY 90 tablet 2  . metoprolol tartrate (LOPRESSOR) 25 MG tablet ONE AND ONE-HALF TABLETS BY MOUTH TWICE DAILY 270 tablet 4  . metoprolol tartrate 37.5 MG TABS Take 37.5 mg by mouth 2 (two) times daily. 60 tablet 0  . Multiple Vitamin (MULTI-VITAMINS) TABS Take 1 tablet by mouth daily.    Marland Kitchen nystatin (MYCOSTATIN) 100000 UNIT/ML suspension Take 100,000 Units by mouth 3 (three) times daily as needed. For mouth  ulcers.    Marland Kitchen oxymetazoline (AFRIN) 0.05 % nasal spray Place 1 spray into both nostrils as needed for congestion.    . rosuvastatin (CRESTOR) 20 MG tablet TAKE ONE TABLET (20 MG) BY MOUTH EVERY EVENING 90 tablet 3  . sertraline (ZOLOFT) 25 MG tablet TAKE 1 TABLET BY MOUTH DAILY 90 tablet 0  . tacrolimus (PROTOPIC) 0.1 % ointment As needed for rosacea flare ups    . traMADol (ULTRAM) 50 MG tablet Take 1 tablet (50 mg total) by mouth every 6 (six) hours as needed for moderate pain. 90 tablet 3  . zinc gluconate 50 MG tablet Take 50 mg by mouth daily.     Marland Kitchen zolpidem (AMBIEN) 5 MG tablet TAKE ONE TABLET BY MOUTH AT BEDTIME AS NEEDED FOR SLEEP 30 tablet 2   No current facility-administered medications for this visit.    Past Medical History:  Diagnosis  Date  . Abnormal nuclear stress test 05/24/2017  . Arthritis   . Asthma   . Atrial fibrillation, transient ([place])   . 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 Cancer ([place])   . Complication of anesthesia   . COVID-19   . Depression   . Family history of adverse reaction to anesthesia    most of family - PONV  . Fibromyalgia   . Glaucoma   . Headache    migraines - 1-2x/mo  . HTN (hypertension) 03/09/2007  . Hyperlipidemia LDL goal <70 03/25/1993  . Hypertension   . Motion sickness    all moving vehicles  . PONV (postoperative nausea and vomiting)   . Squamous cell carcinoma of skin 07/25/2019   Left upper lateral lip above vermillion border. WD SCC with superficial infiltration., [facility] 10/02/19  . Stroke ([place]) 2019  . Thyroid disease     Past Surgical History:  Procedure Laterality Date  . ABDOMINAL HYSTERECTOMY    . APPENDECTOMY    . BLADDER SURGERY    . CATARACT EXTRACTION W/ INTRAOCULAR LENS  IMPLANT, BILATERAL    . COLONOSCOPY WITH PROPOFOL N/A 01/17/2016   Procedure: COLONOSCOPY WITH PROPOFOL;  Surgeon: [name], MD;  Location: [facility];  Service: Endoscopy;  Laterality: N/A;  . CORONARY ARTERY BYPASS GRAFT N/A 02/20/2017   Procedure: CORONARY ARTERY BYPASS GRAFTING (CABG) x , three using left internal mammary artery to left anterior descending coronary artery and right greater saphenous vein harvested endoscopically to distal right and diagonal coronary arteries.;  Surgeon: Grace Isaac, MD;  Location: [place];  Service: Open Heart Surgery;  Laterality: N/A;  . LEFT HEART CATH AND CORONARY ANGIOGRAPHY N/A 02/20/2017   Procedure: LEFT HEART CATH AND CORONARY ANGIOGRAPHY;  Surgeon: Isaias Cowman, MD;  Location: [place] [facility];  Service: Cardiovascular;  Laterality: N/A;  . LEFT HEART CATH AND CORS/GRAFTS ANGIOGRAPHY N/A 05/24/2017   Procedure: LEFT HEART CATH AND CORS/GRAFTS ANGIOGRAPHY;  Surgeon: Sherren Mocha, MD;  Location: [facility];  Service: Cardiovascular;  Laterality: N/A;  . NASAL SINUS SURGERY    . POLYPECTOMY  01/17/2016   Procedure: POLYPECTOMY;  Surgeon: [name], MD;  Location: [facility];  Service: Endoscopy;;  . RIGHT OOPHORECTOMY    . TEE WITHOUT CARDIOVERSION N/A 02/20/2017   Procedure: TRANSESOPHAGEAL ECHOCARDIOGRAM (TEE);  Surgeon: Grace Isaac, MD;  Location: [place];  Service: Open Heart Surgery;  Laterality: N/A;     Social History   Tobacco Use  . Smoking status: Former  Smoker    Quit date: 05/26/1975    Years since quitting: 45.0  . Smokeless tobacco: Never Used  . Tobacco comment: quit 45  years ago   Vaping Use  . Vaping Use: Never used  Substance Use Topics  . Alcohol use: No  . Drug use: No      Family History  Problem Relation Age of Onset  . Alzheimer's disease Mother   . Kidney cancer Mother   . Heart attack Father   . Pancreatic cancer Sister   . Heart attack Brother 107  . Ovarian cancer Sister   . Kidney disease Sister        dialysis  . Cancer Sister        uterin  . Diabetes Sister   . Heart attack Sister   . Heart attack Brother 14  . Ovarian cancer Sister      Allergies  Allergen Reactions  . Cephalexin Anaphylaxis  . Atorvastatin   . Morphine And Related Hives  . Other Hives and Other (See Comments)    Muscle pain  . Procaine Other (See Comments)    Chest pain Chest pain  . Shellfish Allergy Swelling    angioedema  . Statins Other (See Comments)    Muscle pain  . Tomato Other (See Comments)    (by testing) - also beans, wheat, peas (by testing) - also beans, wheat, peas  . Penicillins Hives and Rash    Has patient had a PCN reaction causing immediate rash, facial/tongue/throat swelling, SOB or lightheadedness with hypotension: Yes Has patient had a PCN reaction causing severe rash involving mucus membranes or skin necrosis: No Has patient had a PCN reaction that required hospitalization:  No Has patient had a PCN reaction occurring within the last 10 years: Yes If all of the above answers are "NO", then may proceed with Cephalosporin use.     REVIEW OF SYSTEMS (Negative unless checked)  Constitutional: [] ?Weight loss  [] ?Fever  [] ?Chills Cardiac: [] ?Chest pain   [] ?Chest pressure   [x] ?Palpitations   [] ?Shortness of breath when laying flat   [] ?Shortness of breath at rest   [x] ?Shortness of breath with exertion. Vascular:  [] ?Pain in legs with walking   [] ?Pain in legs at rest   [] ?Pain in legs when laying flat   [] ?Claudication   [] ?Pain in feet when walking  [] ?Pain in feet at rest  [] ?Pain in feet when laying flat   [x] ?History of DVT   [x] ?Phlebitis   [x] ?Swelling in legs   [] ?Varicose veins   [] ?Non-healing ulcers Pulmonary:   [] ?Uses home oxygen   [] ?Productive cough   [] ?Hemoptysis   [] ?Wheeze  [] ?COPD   [] ?Asthma Neurologic:  [] ?Dizziness  [] ?Blackouts   [] ?Seizures   [] ?History of stroke   [] ?History of TIA  [] ?Aphasia   [] ?Temporary blindness   [] ?Dysphagia   [] ?Weakness or numbness in arms   [] ?Weakness or numbness in legs Musculoskeletal:  [x] ?Arthritis   [] ?Joint swelling   [x] ?Joint pain   [] ?Low back pain Hematologic:  [] ?Easy bruising  [] ?Easy bleeding   [] ?Hypercoagulable state   [] ?Anemic  [] ?Hepatitis Gastrointestinal:  [] ?Blood in stool   [] ?Vomiting blood  [] ?Gastroesophageal reflux/heartburn   [] ?Abdominal pain Genitourinary:  [] ?Chronic kidney disease   [] ?Difficult urination  [] ?Frequent urination  [] ?Burning with urination   [] ?Hematuria Skin:  [] ?Rashes   [] ?Ulcers   [] ?Wounds Psychological:  [] ?History of anxiety   [] ? History of major depression.  Physical Examination  BP (!) 156/80   Pulse (!) 120  Ht 5\' 2"  (1.575 m)   Wt 193 lb (87.5 kg)   BMI 35.30 kg/m  Gen:  WD/WN, NAD. Appears younger than stated age. Head: [place]/AT, No temporalis wasting. Ear/Nose/Throat: Hearing grossly intact, nares w/o erythema or drainage Eyes: Conjunctiva clear.  Sclera non-icteric Neck: Supple.  Trachea midline Pulmonary:  Good air movement, no use of accessory muscles.  Cardiac: tachycardic Vascular:  Vessel Right Left  Radial Palpable Palpable                          PT Palpable Palpable  DP Palpable Palpable   Gastrointestinal: soft, non-tender/non-distended. No guarding/reflex.  Musculoskeletal: M/S 5/5 throughout.  No deformity or atrophy. Mild BLE edema. Neurologic: Sensation grossly intact in extremities.  Symmetrical.  Speech is fluent.  Psychiatric: Judgment intact, Mood & affect appropriate for pt's clinical situation. Dermatologic: No rashes or ulcers noted.  No cellulitis or open wounds.       Labs No results found for this or any previous visit (from the past 2160 hour(s)).  Radiology No results found.  Assessment/Plan HTN (hypertension) blood pressure control important in reducing the progression of atherosclerotic disease. On appropriate oral medications.   Hyperlipidemia LDL goal <70 lipid control important in reducing the progression of atherosclerotic disease. Continue statin therapy   Coronary artery disease involving coronary bypass graft of native heart Her swelling really started after her vein harvest for bypass 3 years ago.  Lymphedema Symptoms are overall fairly well controlled.  If her symptoms worsen, she needs to use her lymphedema pump and her compression stockings more reliably.  I am going to stretch her out and see her in 6 months at this point.    Leotis Pain, MD  06/11/2020 2:25 PM    This note was created with Dragon medical transcription system.  Any errors from dictation are purely unintentional

## 2020-06-15 ENCOUNTER — Other Ambulatory Visit: Payer: Self-pay | Admitting: Family Medicine

## 2020-06-15 DIAGNOSIS — F3342 Major depressive disorder, recurrent, in full remission: Secondary | ICD-10-CM

## 2020-06-15 NOTE — Telephone Encounter (Signed)
Requested Prescriptions  Pending Prescriptions Disp Refills  . buPROPion (WELLBUTRIN XL) 300 MG 24 hr tablet [Pharmacy Med Name: BUPROPION HCL ER (XL) 300 MG TAB] 90 tablet 3    Sig: TAKE ONE TABLET BY MOUTH EVERY DAY     Psychiatry: Antidepressants - bupropion Failed - 06/15/2020  9:32 AM      Failed - Last BP in normal range    BP Readings from Last 1 Encounters:  06/11/20 (!) 156/80         Passed - Completed PHQ-2 or PHQ-9 in the last 360 days      Passed - Valid encounter within last 6 months    Recent Outpatient Visits          2 months ago Essential hypertension   [facility] [name], MD   5 months ago Cough   [facility] [name], MD   5 months ago Venous insufficiency of right leg   [facility] [name], MD   6 months ago Cellulitis of right lower extremity   [facility] [name], MD   1 year ago Back pain, unspecified back location, unspecified back pain laterality, unspecified chronicity   [facility] [name], MD

## 2020-06-22 ENCOUNTER — Other Ambulatory Visit: Payer: Self-pay | Admitting: Family Medicine

## 2020-06-22 DIAGNOSIS — I10 Essential (primary) hypertension: Secondary | ICD-10-CM

## 2020-06-29 ENCOUNTER — Other Ambulatory Visit: Payer: Self-pay | Admitting: Family Medicine

## 2020-06-29 DIAGNOSIS — F411 Generalized anxiety disorder: Secondary | ICD-10-CM

## 2020-06-29 NOTE — Telephone Encounter (Signed)
Requested Prescriptions  Pending Prescriptions Disp Refills  . sertraline (ZOLOFT) 25 MG tablet [Pharmacy Med Name: SERTRALINE HCL 25 MG TAB] 90 tablet 0    Sig: TAKE 1 TABLET BY MOUTH DAILY     Psychiatry:  Antidepressants - SSRI Passed - 06/29/2020  8:47 AM      Passed - Completed PHQ-2 or PHQ-9 in the last 360 days      Passed - Valid encounter within last 6 months    Recent Outpatient Visits          2 months ago Essential hypertension   [facility] [name], MD   5 months ago Cough   [facility] [name], MD   6 months ago Venous insufficiency of right leg   [facility] [name], MD   7 months ago Cellulitis of right lower extremity   [facility] [name], MD   1 year ago Back pain, unspecified back location, unspecified back pain laterality, unspecified chronicity   [facility] [name], MD

## 2020-07-25 ENCOUNTER — Ambulatory Visit (INDEPENDENT_AMBULATORY_CARE_PROVIDER_SITE_OTHER): Payer: Medicare Other | Admitting: Family Medicine

## 2020-07-25 ENCOUNTER — Encounter: Payer: Self-pay | Admitting: Family Medicine

## 2020-07-25 ENCOUNTER — Other Ambulatory Visit: Payer: Self-pay

## 2020-07-25 VITALS — BP 128/74 | HR 91 | Temp 98.2°F | Resp 16 | Wt 195.0 lb

## 2020-07-25 DIAGNOSIS — I25709 Atherosclerosis of coronary artery bypass graft(s), unspecified, with unspecified angina pectoris: Secondary | ICD-10-CM

## 2020-07-25 DIAGNOSIS — I480 Paroxysmal atrial fibrillation: Secondary | ICD-10-CM | POA: Diagnosis not present

## 2020-07-25 DIAGNOSIS — I890 Lymphedema, not elsewhere classified: Secondary | ICD-10-CM

## 2020-07-25 DIAGNOSIS — L03115 Cellulitis of right lower limb: Secondary | ICD-10-CM

## 2020-07-25 DIAGNOSIS — Z951 Presence of aortocoronary bypass graft: Secondary | ICD-10-CM

## 2020-07-25 DIAGNOSIS — E039 Hypothyroidism, unspecified: Secondary | ICD-10-CM | POA: Diagnosis not present

## 2020-07-25 MED ORDER — DOXYCYCLINE HYCLATE 100 MG PO TABS: 100.0000 mg | ORAL_TABLET | Freq: Two times a day (BID) | ORAL | 1 refills | Status: DC

## 2020-07-25 NOTE — Progress Notes (Signed)
Established patient visit   Patient: Stacey [name]   DOB: 16-Jan-1941   80 y.o. Female  MRN: [id number] Visit Date: 07/25/2020  Today's healthcare provider: Wilhemena Durie, MD   Chief Complaint  Patient presents with  . Rash   Subjective    Rash This is a new problem. The current episode started in the past 7 days. The affected locations include the left toes. The rash is characterized by redness and itchiness. She was exposed to nothing.    Patient presents with a similar presentation last year.  She underwent Dopplers at that time and so Dr. Lucky Cowboy from vascular who felt this was lymphedema and venous insufficiency secondary to her bypass surgery.      Medications: Outpatient Medications Prior to Visit  Medication Sig  . acetaminophen (TYLENOL) 325 MG tablet Take 2 tablets (650 mg total) by mouth every 6 (six) hours as needed for mild pain.  Marland Kitchen albuterol (VENTOLIN HFA) 108 (90 Base) MCG/ACT inhaler INHALE 2 PUFFS BY MOUTH EVERY 4 TO 6 HOURS AS NEEDED FOR SHORTNESS OF BREATH  . amLODipine (NORVASC) 5 MG tablet Take 1 tablet (5 mg total) daily by mouth.  Marland Kitchen apixaban (ELIQUIS) 5 MG TABS tablet Take 1 tablet (5 mg total) by mouth 2 (two) times daily.  Marland Kitchen ascorbic acid (VITAMIN C) 500 MG tablet Take 2,000 mg by mouth daily.   Marland Kitchen aspirin 81 MG tablet Take 1 tablet (81 mg total) by mouth daily.  Marland Kitchen buPROPion (WELLBUTRIN XL) 300 MG 24 hr tablet TAKE ONE TABLET BY MOUTH EVERY DAY  . chlorthalidone (HYGROTON) 25 MG tablet TAKE 1/2 TABLET BY MOUTH DAILY.  Marland Kitchen Cholecalciferol (VITAMIN D) 2000 units tablet Take 2,000 Units by mouth daily.  . clindamycin (CLEOCIN) 150 MG capsule TAKE 1 CAPSULE 3 TIMES DAILY UNTIL FINISHED  . Cyanocobalamin (B-12) 500 MCG TABS Take 500 mcg by mouth daily.   . diazepam (VALIUM) 5 MG tablet Take 1 tablet (5 mg total) by mouth every 12 (twelve) hours as needed for anxiety. (Patient taking differently: Take 5 mg by mouth every 12 (twelve) hours as needed for  anxiety. Takes 1/2 tablet)  . doxycycline (VIBRA-TABS) 100 MG tablet Take 1 tablet (100 mg total) by mouth 2 (two) times daily.  Marland Kitchen ELDERBERRY PO Take by mouth daily. Unsure dose / combination vitamin  . EPINEPHrine (EPIPEN 2-PAK) 0.3 mg/0.3 mL IJ SOAJ injection INJECT AS DIRECTED FOR SEVERE ALLERGIC REACTION (Patient taking differently: Inject 0.3 mg into the muscle as needed (for severe allergic reaction).)  . fluocinonide gel (LIDEX) 1.60 % Apply 1 application topically as needed.  . Garlic 737 MG TABS Take 500 mg by mouth daily. Every other day  . isosorbide mononitrate (IMDUR) 30 MG 24 hr tablet Take 30 mg by mouth daily.  Marland Kitchen levothyroxine (SYNTHROID) 100 MCG tablet TAKE ONE TABLET BY MOUTH EVERY DAY  . lisinopril (PRINIVIL,ZESTRIL) 40 MG tablet TAKE 1 TABLET BY MOUTH EVERY DAY  . metoprolol tartrate (LOPRESSOR) 25 MG tablet ONE AND ONE-HALF TABLETS BY MOUTH TWICE DAILY  . metoprolol tartrate 37.5 MG TABS Take 37.5 mg by mouth 2 (two) times daily.  . Multiple Vitamin (MULTI-VITAMINS) TABS Take 1 tablet by mouth daily.  Marland Kitchen nystatin (MYCOSTATIN) 100000 UNIT/ML suspension Take 100,000 Units by mouth 3 (three) times daily as needed. For mouth ulcers.  Marland Kitchen oxymetazoline (AFRIN) 0.05 % nasal spray Place 1 spray into both nostrils as needed for congestion.  . rosuvastatin (CRESTOR) 20 MG tablet TAKE  ONE TABLET (20 MG) BY MOUTH EVERY EVENING  . sertraline (ZOLOFT) 25 MG tablet TAKE 1 TABLET BY MOUTH DAILY  . tacrolimus (PROTOPIC) 0.1 % ointment As needed for rosacea flare ups  . traMADol (ULTRAM) 50 MG tablet Take 1 tablet (50 mg total) by mouth every 6 (six) hours as needed for moderate pain.  Marland Kitchen zinc gluconate 50 MG tablet Take 50 mg by mouth daily.   Marland Kitchen zolpidem (AMBIEN) 5 MG tablet TAKE ONE TABLET BY MOUTH AT BEDTIME AS NEEDED FOR SLEEP   No facility-administered medications prior to visit.    Review of Systems  Skin: Positive for rash.        Objective    BP 128/74   Pulse 91   Temp 98.2  F (36.8 C)   Resp 16   Wt 195 lb (88.5 kg)   BMI 35.67 kg/m      Physical Exam Vitals reviewed.  Constitutional:      Appearance: Normal appearance.  HENT:     Head: Normocephalic and atraumatic.     Right Ear: External ear normal.     Left Ear: External ear normal.  Eyes:     General: No scleral icterus.    Conjunctiva/sclera: Conjunctivae normal.  Cardiovascular:     Rate and Rhythm: Normal rate and regular rhythm.     Pulses: Normal pulses.     Heart sounds: Normal heart sounds.  Pulmonary:     Effort: Pulmonary effort is normal.     Breath sounds: Normal breath sounds.  Musculoskeletal:     Right lower leg: Edema present.     Left lower leg: No edema.     Comments: Left leg shows trace edema.  Right leg shows 2+ edema. No cords negative Homans.  Calf is more swollen on the right than left.  Calf measures 15 inches left calf measures 14.25 just.  Skin:    General: Skin is warm and dry.  Neurological:     General: No focal deficit present.     Mental Status: She is alert and oriented to person, place, and time.  Psychiatric:        Mood and Affect: Mood normal.        Behavior: Behavior normal.        Thought Content: Thought content normal.        Judgment: Judgment normal.       No results found for any visits on 07/25/20.  Assessment & Plan     1. Cellulitis of right lower extremity She has several drug allergies to biotics. - doxycycline (VIBRA-TABS) 100 MG tablet; Take 1 tablet (100 mg total) by mouth 2 (two) times daily.  Dispense: 14 tablet; Refill: 1  2. Coronary artery disease involving coronary bypass graft of native heart with angina pectoris ([place]) All risk factors treated.  3. PAF (paroxysmal atrial fibrillation) (HCC) On Eliquis  4. Adult hypothyroidism   5. S/P CABG x 4 All risk factors treated  6. Lymphedema She has lymphedema pump at home.  Encouraged her to use same   No follow-ups on file.      I, Wilhemena Durie, MD,  have reviewed all documentation for this visit. The documentation on 07/26/20 for the exam, diagnosis, procedures, and orders are all accurate and complete.     Cranford Mon, MD  [facility] [phone] (phone) [phone] (fax)  [place]

## 2020-07-26 ENCOUNTER — Other Ambulatory Visit: Payer: Self-pay | Admitting: Family Medicine

## 2020-07-26 DIAGNOSIS — F5101 Primary insomnia: Secondary | ICD-10-CM

## 2020-07-26 NOTE — Telephone Encounter (Signed)
Requested medication (s) are due for refill today: yes  Requested medication (s) are on the active medication list: yes  Last refill:  06/12/20  Future visit scheduled: no  Notes to clinic:  not delegated    Requested Prescriptions  Pending Prescriptions Disp Refills   zolpidem (AMBIEN) 5 MG tablet [Pharmacy Med Name: ZOLPIDEM TARTRATE 5 MG TAB] 30 tablet     Sig: TAKE ONE TABLET BY MOUTH AT BEDTIME AS NEEDED FOR SLEEP      Not Delegated - Psychiatry:  Anxiolytics/Hypnotics Failed - 07/26/2020  8:06 AM      Failed - This refill cannot be delegated      Failed - Urine Drug Screen completed in last 360 days      Passed - Valid encounter within last 6 months    Recent Outpatient Visits           Yesterday Cellulitis of right lower extremity   [facility] [name], MD   3 months ago Essential hypertension   [facility] [name], MD   6 months ago Cough   [facility] [name], MD   7 months ago Venous insufficiency of right leg   [facility] [name], MD   7 months ago Cellulitis of right lower extremity   [facility] [name], MD

## 2020-08-26 ENCOUNTER — Telehealth: Payer: Self-pay

## 2020-08-26 NOTE — Telephone Encounter (Signed)
Last visit was a month ago. Needs office visit or go to Urgent Care if no appointments available.

## 2020-08-26 NOTE — Telephone Encounter (Signed)
Copied from [place] [id number] [id number]. Topic: General - Other >> Aug 23, 2020 11:06 AM Mcneil, Ja-Kwan wrote: Reason for CRM: Pt husband stated pt is not getting any better he actually thinks that the cellulitis is worse. Pt husband request call back for sooner appt or possibly a prescription that would help with the cellulitis.

## 2020-08-26 NOTE — Telephone Encounter (Signed)
Scheduled appt with Grace Bushy tomorrow.

## 2020-08-27 ENCOUNTER — Encounter: Payer: Self-pay | Admitting: Physician Assistant

## 2020-08-27 ENCOUNTER — Other Ambulatory Visit: Payer: Self-pay

## 2020-08-27 ENCOUNTER — Ambulatory Visit (INDEPENDENT_AMBULATORY_CARE_PROVIDER_SITE_OTHER): Payer: Medicare Other | Admitting: Physician Assistant

## 2020-08-27 VITALS — BP 116/73 | HR 83 | Temp 98.0°F | Resp 16 | Wt 189.0 lb

## 2020-08-27 DIAGNOSIS — I25709 Atherosclerosis of coronary artery bypass graft(s), unspecified, with unspecified angina pectoris: Secondary | ICD-10-CM | POA: Diagnosis not present

## 2020-08-27 DIAGNOSIS — L03115 Cellulitis of right lower limb: Secondary | ICD-10-CM | POA: Diagnosis not present

## 2020-08-27 MED ORDER — SULFAMETHOXAZOLE-TRIMETHOPRIM 800-160 MG PO TABS: 1.0000 | ORAL_TABLET | Freq: Two times a day (BID) | ORAL | 0 refills | Status: DC

## 2020-08-27 NOTE — Progress Notes (Signed)
Established patient visit   Patient: Stacey [name]   DOB: [birth year]   80 y.o. Female  MRN: [id number] Visit Date: 08/27/2020  Today's healthcare provider: Mar Daring, PA-C   Chief Complaint  Patient presents with  . Cellulitis   Subjective    HPI  Patient presents today c/o cellulitis in her lower legs. She was seen in the office on 07/25/2020 for this. However, she feels that it never completely got better. She has been using her lymphedema pumps but legs have continued to swell throughout the day. The right is worse than the left. She is now having warmth, redness and pain in the right again like before. Has some minimal redness in the left.   Patient Active Problem List   Diagnosis Date Noted  . OSA (obstructive sleep apnea) 02/06/2020  . Lymphedema 01/19/2020  . Cellulitis of right lower extremity 11/30/2019  . Personal history of other malignant neoplasm of skin 10/02/2019  . Chest pain 10/26/2018  . TIA (transient ischemic attack) 03/21/2018  . CVA (cerebral vascular accident) ([place]) 10/22/2017  . Sciatic pain, right 07/02/2017  . Abnormal nuclear stress test 05/24/2017  . 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 PAF (paroxysmal atrial fibrillation) ([place])   . Non-STEMI (non-ST elevated myocardial infarction) ([place]) 02/20/2017  . 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 Moderate mitral insufficiency 01/11/2017  . Personal history of colonic polyps   . Benign neoplasm of ascending colon   . Benign neoplasm of descending colon   . Disequilibrium 09/16/2015  . Panic attacks 09/11/2015  . Ganglion cyst 08/16/2015  . Hypernatremia 07/04/2015  . Other osteoarthritis involving multiple joints 02/06/2015  . Clinical depression 09/27/2014  . Dry mouth 09/27/2014  . Fibrositis 09/27/2014  . LBP (low back pain) 09/27/2014  . Avitaminosis D 09/27/2014  . Decreased leukocytes 09/27/2014  . Abnormal blood sugar 05/03/2009  .  Insomnia 09/29/2007  . HTN (hypertension) 03/09/2007  . Adaptation reaction 01/29/2007  . Acid reflux 10/07/2006  . Menopausal and postmenopausal disorder 10/05/2006  . Headache, migraine 10/05/2006  . Arthritis, degenerative 03/23/1994  . Adult hypothyroidism 04/15/1993  . Hyperlipidemia LDL goal <70 03/25/1993   Past Medical History:  Diagnosis Date  . Abnormal nuclear stress test 05/24/2017  . Arthritis   . Asthma   . Atrial fibrillation, transient ([place])   . 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 Cancer ([place])   . Complication of anesthesia   . COVID-19   . Depression   . Family history of adverse reaction to anesthesia    most of family - PONV  . Fibromyalgia   . Glaucoma   . Headache    migraines - 1-2x/mo  . HTN (hypertension) 03/09/2007  . Hyperlipidemia LDL goal <70 03/25/1993  . Hypertension   . Motion sickness    all moving vehicles  . PONV (postoperative nausea and vomiting)   . Squamous cell carcinoma of skin 07/25/2019   Left upper lateral lip above vermillion border. WD SCC with superficial infiltration., [facility] 10/02/19  . Stroke ([place]) 2019  . Thyroid disease        Medications: Outpatient Medications Prior to Visit  Medication Sig  . acetaminophen (TYLENOL) 325 MG tablet Take 2 tablets (650 mg total) by mouth every 6 (six) hours as needed for mild pain.  Marland Kitchen albuterol (VENTOLIN HFA) 108 (90 Base) MCG/ACT inhaler INHALE 2 PUFFS  BY MOUTH EVERY 4 TO 6 HOURS AS NEEDED FOR SHORTNESS OF BREATH  . amLODipine (NORVASC) 5 MG tablet Take 1 tablet (5 mg total) daily by mouth.  Marland Kitchen apixaban (ELIQUIS) 5 MG TABS tablet Take 1 tablet (5 mg total) by mouth 2 (two) times daily.  Marland Kitchen ascorbic acid (VITAMIN C) 500 MG tablet Take 2,000 mg by mouth daily.   Marland Kitchen aspirin 81 MG tablet Take 1 tablet (81 mg total) by mouth daily.  Marland Kitchen buPROPion (WELLBUTRIN XL) 300 MG 24 hr tablet TAKE ONE TABLET BY MOUTH EVERY DAY  . chlorthalidone (HYGROTON) 25 MG  tablet TAKE 1/2 TABLET BY MOUTH DAILY.  Marland Kitchen Cholecalciferol (VITAMIN D) 2000 units tablet Take 2,000 Units by mouth daily.  . clindamycin (CLEOCIN) 150 MG capsule TAKE 1 CAPSULE 3 TIMES DAILY UNTIL FINISHED  . Cyanocobalamin (B-12) 500 MCG TABS Take 500 mcg by mouth daily.   . diazepam (VALIUM) 5 MG tablet Take 1 tablet (5 mg total) by mouth every 12 (twelve) hours as needed for anxiety. (Patient taking differently: Take 5 mg by mouth every 12 (twelve) hours as needed for anxiety. Takes 1/2 tablet)  . doxycycline (VIBRA-TABS) 100 MG tablet Take 1 tablet (100 mg total) by mouth 2 (two) times daily.  Marland Kitchen ELDERBERRY PO Take by mouth daily. Unsure dose / combination vitamin  . EPINEPHrine (EPIPEN 2-PAK) 0.3 mg/0.3 mL IJ SOAJ injection INJECT AS DIRECTED FOR SEVERE ALLERGIC REACTION (Patient taking differently: Inject 0.3 mg into the muscle as needed (for severe allergic reaction).)  . fluocinonide gel (LIDEX) 4.25 % Apply 1 application topically as needed.  . Garlic 956 MG TABS Take 500 mg by mouth daily. Every other day  . isosorbide mononitrate (IMDUR) 30 MG 24 hr tablet Take 30 mg by mouth daily.  Marland Kitchen levothyroxine (SYNTHROID) 100 MCG tablet TAKE ONE TABLET BY MOUTH EVERY DAY  . lisinopril (PRINIVIL,ZESTRIL) 40 MG tablet TAKE 1 TABLET BY MOUTH EVERY DAY  . metoprolol tartrate (LOPRESSOR) 25 MG tablet ONE AND ONE-HALF TABLETS BY MOUTH TWICE DAILY  . metoprolol tartrate 37.5 MG TABS Take 37.5 mg by mouth 2 (two) times daily.  . Multiple Vitamin (MULTI-VITAMINS) TABS Take 1 tablet by mouth daily.  Marland Kitchen nystatin (MYCOSTATIN) 100000 UNIT/ML suspension Take 100,000 Units by mouth 3 (three) times daily as needed. For mouth ulcers.  Marland Kitchen oxymetazoline (AFRIN) 0.05 % nasal spray Place 1 spray into both nostrils as needed for congestion.  . rosuvastatin (CRESTOR) 20 MG tablet TAKE ONE TABLET (20 MG) BY MOUTH EVERY EVENING  . sertraline (ZOLOFT) 25 MG tablet TAKE 1 TABLET BY MOUTH DAILY  . tacrolimus (PROTOPIC) 0.1 %  ointment As needed for rosacea flare ups  . traMADol (ULTRAM) 50 MG tablet Take 1 tablet (50 mg total) by mouth every 6 (six) hours as needed for moderate pain.  Marland Kitchen zinc gluconate 50 MG tablet Take 50 mg by mouth daily.   Marland Kitchen zolpidem (AMBIEN) 5 MG tablet TAKE ONE TABLET BY MOUTH AT BEDTIME AS NEEDED FOR SLEEP   No facility-administered medications prior to visit.    Review of Systems  Constitutional: Negative for fatigue.  Cardiovascular: Positive for leg swelling. Negative for chest pain and palpitations.  Musculoskeletal: Positive for myalgias.  Skin: Positive for color change. Negative for pallor, rash and wound.  Neurological: Negative for dizziness and weakness.    Last CBC Lab Results  Component Value Date   WBC 5.5 12/26/2019   HGB 14.5 12/26/2019   HCT 45.0 12/26/2019   MCV 90  12/26/2019   MCH 28.9 12/26/2019   RDW 12.3 12/26/2019   PLT 161 63/05/6008   Last metabolic panel Lab Results  Component Value Date   GLUCOSE 122 (H) 12/26/2019   NA 137 12/26/2019   K 4.2 12/26/2019   CL 97 12/26/2019   CO2 25 12/26/2019   BUN 17 12/26/2019   CREATININE 1.03 (H) 12/26/2019   GFRNONAA 53 (L) 12/26/2019   GFRAA 61 12/26/2019   CALCIUM 9.2 12/26/2019   PROT 6.4 12/26/2019   ALBUMIN 4.5 12/26/2019   LABGLOB 1.9 12/26/2019   AGRATIO 2.4 (H) 12/26/2019   BILITOT 0.4 12/26/2019   ALKPHOS 90 12/26/2019   AST 19 12/26/2019   ALT 14 12/26/2019   ANIONGAP 9 10/25/2018       Objective    BP 116/73   Pulse 83   Temp 98 F (36.7 C)   Resp 16   Wt 189 lb (85.7 kg)   BMI 34.57 kg/m  BP Readings from Last 3 Encounters:  08/27/20 116/73  07/25/20 128/74  06/11/20 (!) 156/80   Wt Readings from Last 3 Encounters:  08/27/20 189 lb (85.7 kg)  07/25/20 195 lb (88.5 kg)  06/11/20 193 lb (87.5 kg)       Physical Exam Vitals reviewed.  Constitutional:      General: She is not in acute distress.    Appearance: Normal appearance. She is well-developed. She is not  ill-appearing.  HENT:     Head: Normocephalic and atraumatic.  Pulmonary:     Effort: Pulmonary effort is normal. No respiratory distress.  Musculoskeletal:     Cervical back: Normal range of motion and neck supple.     Right lower leg: Edema (2+ pititng edema) present.     Left lower leg: Edema (trace to 1+ pitting) present.  Skin:    General: Skin is warm.     Findings: Erythema (right lower extremity) present.  Neurological:     General: No focal deficit present.     Mental Status: She is alert. Mental status is at baseline.  Psychiatric:        Mood and Affect: Mood normal.        Behavior: Behavior normal.        Thought Content: Thought content normal.        Judgment: Judgment normal.     No results found for any visits on 08/27/20.  Assessment & Plan     1. Cellulitis of right lower extremity Worsening again. Did not completely resolve with doxycycline. Has taken Bactrim before (2016) without issue. Will try bactrim as below. Continue elevating legs and using lymphedema pump. Call if not tolerating antibiotic or if symptoms recur or do not improve.  - sulfamethoxazole-trimethoprim (BACTRIM DS) 800-160 MG tablet; Take 1 tablet by mouth 2 (two) times daily.  Dispense: 20 tablet; Refill: 0   No follow-ups on file.      Reynolds Bowl, PA-C, have reviewed all documentation for this visit. The documentation on 08/27/20 for the exam, diagnosis, procedures, and orders are all accurate and complete.   Rubye Beach  [facility] [phone] (phone) [phone] (fax)  [place]

## 2020-09-11 ENCOUNTER — Encounter: Payer: Medicare Other | Admitting: Dermatology

## 2020-09-14 ENCOUNTER — Other Ambulatory Visit: Payer: Self-pay | Admitting: Family Medicine

## 2020-09-14 NOTE — Telephone Encounter (Signed)
Requested Prescriptions  Pending Prescriptions Disp Refills  . metoprolol tartrate (LOPRESSOR) 25 MG tablet [Pharmacy Med Name: METOPROLOL TARTRATE 25 MG TAB] 270 tablet 0    Sig: ONE AND ONE-HALF TABLETS BY MOUTH TWICE DAILY     Cardiovascular:  Beta Blockers Passed - 09/14/2020  9:25 AM      Passed - Last BP in normal range    BP Readings from Last 1 Encounters:  08/27/20 116/73         Passed - Last Heart Rate in normal range    Pulse Readings from Last 1 Encounters:  08/27/20 83         Passed - Valid encounter within last 6 months    Recent Outpatient Visits          2 weeks ago Cellulitis of right lower extremity   [place], [place]   1 month ago Cellulitis of right lower extremity   [facility] [name], MD   5 months ago Essential hypertension   [facility] [name], MD   8 months ago Cough   [facility] [name], MD   8 months ago Venous insufficiency of right leg   [facility] [name], MD      Future Appointments            In 3 months Ralene Bathe, MD [place]

## 2020-09-21 ENCOUNTER — Other Ambulatory Visit: Payer: Self-pay | Admitting: Family Medicine

## 2020-09-21 DIAGNOSIS — F411 Generalized anxiety disorder: Secondary | ICD-10-CM

## 2020-09-21 NOTE — Telephone Encounter (Signed)
Requested Prescriptions  Pending Prescriptions Disp Refills  . sertraline (ZOLOFT) 25 MG tablet [Pharmacy Med Name: SERTRALINE HCL 25 MG TAB] 90 tablet 0    Sig: TAKE 1 TABLET BY MOUTH DAILY     Psychiatry:  Antidepressants - SSRI Passed - 09/21/2020  8:56 AM      Passed - Completed PHQ-2 or PHQ-9 in the last 360 days      Passed - Valid encounter within last 6 months    Recent Outpatient Visits          3 weeks ago Cellulitis of right lower extremity   [place], [place]   1 month ago Cellulitis of right lower extremity   [facility] Stacey [name]., MD   5 months ago Essential hypertension   [facility] Stacey [name]., MD   8 months ago Cough   [facility] Stacey [name]., MD   9 months ago Venous insufficiency of right leg   [facility] Stacey [name]., MD      Future Appointments            In 3 months Stacey Bathe, MD [place]

## 2020-10-03 DIAGNOSIS — E782 Mixed hyperlipidemia: Secondary | ICD-10-CM | POA: Diagnosis not present

## 2020-10-03 DIAGNOSIS — G4733 Obstructive sleep apnea (adult) (pediatric): Secondary | ICD-10-CM | POA: Diagnosis not present

## 2020-10-03 DIAGNOSIS — I10 Essential (primary) hypertension: Secondary | ICD-10-CM | POA: Diagnosis not present

## 2020-10-03 DIAGNOSIS — I4819 Other persistent atrial fibrillation: Secondary | ICD-10-CM | POA: Diagnosis not present

## 2020-10-03 DIAGNOSIS — I25810 Atherosclerosis of coronary artery bypass graft(s) without angina pectoris: Secondary | ICD-10-CM | POA: Diagnosis not present

## 2020-10-25 ENCOUNTER — Emergency Department
Admission: EM | Admit: 2020-10-25 | Discharge: 2020-10-25 | Disposition: A | Discharge status: Home / Self Care | Payer: Medicare Other | Attending: Emergency Medicine | Admitting: Emergency Medicine

## 2020-10-25 ENCOUNTER — Encounter: Payer: Self-pay | Admitting: Emergency Medicine

## 2020-10-25 ENCOUNTER — Other Ambulatory Visit: Payer: Self-pay

## 2020-10-25 DIAGNOSIS — R040 Epistaxis: Secondary | ICD-10-CM | POA: Diagnosis not present

## 2020-10-25 DIAGNOSIS — Z5321 Procedure and treatment not carried out due to patient leaving prior to being seen by health care provider: Secondary | ICD-10-CM | POA: Insufficient documentation

## 2020-10-25 MED ORDER — OXYMETAZOLINE HCL 0.05 % NA SOLN
1.0000 | Freq: Once | NASAL | Status: AC
  Administered 2020-10-25: 1 via NASAL
  Filled 2020-10-25: qty 30

## 2020-10-25 NOTE — ED Triage Notes (Signed)
Pt reports that she is on Eliquist and has been unable to get her nose to stop bleeding. She states that it has been bleeding for the last 40 minutes. Nose has minimal bleeding at this time.

## 2020-10-28 DIAGNOSIS — J340 Abscess, furuncle and carbuncle of nose: Secondary | ICD-10-CM | POA: Diagnosis not present

## 2020-10-28 DIAGNOSIS — R040 Epistaxis: Secondary | ICD-10-CM | POA: Diagnosis not present

## 2020-10-29 DIAGNOSIS — R040 Epistaxis: Secondary | ICD-10-CM | POA: Diagnosis not present

## 2020-10-29 DIAGNOSIS — J340 Abscess, furuncle and carbuncle of nose: Secondary | ICD-10-CM | POA: Diagnosis not present

## 2020-11-04 DIAGNOSIS — R040 Epistaxis: Secondary | ICD-10-CM | POA: Diagnosis not present

## 2020-12-03 ENCOUNTER — Ambulatory Visit (INDEPENDENT_AMBULATORY_CARE_PROVIDER_SITE_OTHER): Payer: Medicare Other | Admitting: Vascular Surgery

## 2020-12-21 ENCOUNTER — Other Ambulatory Visit: Payer: Self-pay | Admitting: Family Medicine

## 2020-12-21 NOTE — Telephone Encounter (Signed)
Requested medication (s) are due for refill today: yes  Requested medication (s) are on the active medication list: yes. See notes to clinic.  Last refill:  on Med record twice.   Future visit scheduled: no  Notes to clinic:  Medication is on active med list twice. Though the doses are the same, each has no end date please review   Requested Prescriptions  Pending Prescriptions Disp Refills   metoprolol tartrate (LOPRESSOR) 25 MG tablet [Pharmacy Med Name: METOPROLOL TARTRATE 25 MG TAB] 270 tablet 0    Sig: ONE AND ONE-HALF TABLETS BY MOUTH TWICE DAILY      Cardiovascular:  Beta Blockers Failed - 12/21/2020  8:40 AM      Failed - Last BP in normal range    BP Readings from Last 1 Encounters:  08/27/20 116/73          Passed - Last Heart Rate in normal range    Pulse Readings from Last 1 Encounters:  08/27/20 83          Passed - Valid encounter within last 6 months    Recent Outpatient Visits           3 months ago Cellulitis of right lower extremity   [place], [place]   4 months ago Cellulitis of right lower extremity   [facility] [name], MD   8 months ago Essential hypertension   [facility] [name], MD   11 months ago Cough   [facility] [name], MD   12 months ago Venous insufficiency of right leg   [facility] [name], MD       Future Appointments             In 2 weeks Ralene Bathe, MD [place]

## 2020-12-28 ENCOUNTER — Other Ambulatory Visit: Payer: Self-pay | Admitting: Family Medicine

## 2020-12-28 DIAGNOSIS — F411 Generalized anxiety disorder: Secondary | ICD-10-CM

## 2020-12-28 NOTE — Telephone Encounter (Signed)
Requested Prescriptions  Pending Prescriptions Disp Refills  . sertraline (ZOLOFT) 25 MG tablet [Pharmacy Med Name: SERTRALINE HCL 25 MG TAB] 90 tablet 0    Sig: TAKE 1 TABLET BY MOUTH DAILY     Psychiatry:  Antidepressants - SSRI Passed - 12/28/2020  8:33 AM      Passed - Completed PHQ-2 or PHQ-9 in the last 360 days      Passed - Valid encounter within last 6 months    Recent Outpatient Visits          4 months ago Cellulitis of right lower extremity   [place], [place]   5 months ago Cellulitis of right lower extremity   [facility] [name], MD   8 months ago Essential hypertension   [facility] [name], MD   11 months ago Cough   [facility] [name], MD   1 year ago Venous insufficiency of right leg   [facility] [name], MD      Future Appointments            In 1 week Ralene Bathe, MD [place]

## 2021-01-02 ENCOUNTER — Ambulatory Visit: Payer: Medicare Other | Admitting: Dermatology

## 2021-01-09 ENCOUNTER — Other Ambulatory Visit: Payer: Self-pay

## 2021-01-09 ENCOUNTER — Ambulatory Visit (INDEPENDENT_AMBULATORY_CARE_PROVIDER_SITE_OTHER): Payer: Medicare Other | Admitting: Dermatology

## 2021-01-09 DIAGNOSIS — D229 Melanocytic nevi, unspecified: Secondary | ICD-10-CM | POA: Diagnosis not present

## 2021-01-09 DIAGNOSIS — L578 Other skin changes due to chronic exposure to nonionizing radiation: Secondary | ICD-10-CM | POA: Diagnosis not present

## 2021-01-09 DIAGNOSIS — Z1283 Encounter for screening for malignant neoplasm of skin: Secondary | ICD-10-CM

## 2021-01-09 DIAGNOSIS — B001 Herpesviral vesicular dermatitis: Secondary | ICD-10-CM | POA: Diagnosis not present

## 2021-01-09 DIAGNOSIS — L820 Inflamed seborrheic keratosis: Secondary | ICD-10-CM

## 2021-01-09 DIAGNOSIS — I25709 Atherosclerosis of coronary artery bypass graft(s), unspecified, with unspecified angina pectoris: Secondary | ICD-10-CM | POA: Diagnosis not present

## 2021-01-09 DIAGNOSIS — D692 Other nonthrombocytopenic purpura: Secondary | ICD-10-CM

## 2021-01-09 DIAGNOSIS — D1800 Hemangioma unspecified site: Secondary | ICD-10-CM

## 2021-01-09 DIAGNOSIS — L814 Other melanin hyperpigmentation: Secondary | ICD-10-CM

## 2021-01-09 DIAGNOSIS — L821 Other seborrheic keratosis: Secondary | ICD-10-CM

## 2021-01-09 DIAGNOSIS — L304 Erythema intertrigo: Secondary | ICD-10-CM | POA: Diagnosis not present

## 2021-01-09 DIAGNOSIS — Z85828 Personal history of other malignant neoplasm of skin: Secondary | ICD-10-CM

## 2021-01-09 MED ORDER — ACYCLOVIR 400 MG PO TABS
ORAL_TABLET | ORAL | 11 refills | Status: DC
Start: 2021-01-09 — End: 2023-07-12

## 2021-01-09 MED ORDER — KETOCONAZOLE 2 % EX CREA: 1.0000 "application " | TOPICAL_CREAM | Freq: Two times a day (BID) | CUTANEOUS | 0 refills | Status: DC

## 2021-01-09 NOTE — Progress Notes (Signed)
Follow-Up Visit   Subjective  Stacey [name] is a 80 y.o. female who presents for the following: TBSE (Total body exam today. Pt has hx of SCC that was treated with [facility] last year. Pt states that has healed well. She has a spot on her back that she would like looked at today. Pt currently has a fever blister on her upper lip that flared while at the beach recently. She states that she gets them at least once a year. ).  Patient here for full body skin exam and skin cancer screening.  The following portions of the chart were reviewed this encounter and updated as appropriate:  Tobacco  Allergies  Meds  Problems  Med Hx  Surg Hx  Fam Hx     Review of Systems: No other skin or systemic complaints except as noted in HPI or Assessment and Plan.  Objective  Well appearing patient in no apparent distress; mood and affect are within normal limits.  A full examination was performed including scalp, head, eyes, ears, nose, lips, neck, chest, axillae, abdomen, back, buttocks, bilateral upper extremities, bilateral lower extremities, hands, feet, fingers, toes, fingernails, and toenails. All findings within normal limits unless otherwise noted below.  left mid back x 1 Erythematous keratotic or waxy stuck-on papule or plaque.   Assessment & Plan  Fever blister upper lip Herpes Simplex Virus = Cold Sores = Fever Blisters is a chronic recurring blistering; scabbing sore-producing viral infection that is recurrent usually in the same area triggered by stress, sun/UV exposure and trauma.  It is infectious and can be spread from person to person by direct contact.  It is not curable, but is treatable with topical and oral medication.  Start Acyclovir. Take 400 mg 1 PO TID x 5 days to start immediately with each episode of fever blister. May also take one a day for prevention. #30 [id number]   acyclovir (ZOVIRAX) 400 MG tablet - upper lip Take 400 mg by mouth 3 times per day for 5 days to start  immediately with each episode of fever blister. May also take one a day for prevention.  Inflamed seborrheic keratosis left mid back x 1 Prior to procedure, discussed risks of blister formation, small wound, skin dyspigmentation, or rare scar following cryotherapy. Recommend Vaseline ointment to treated areas while healing.  Destruction of lesion - left mid back x 1 Complexity: simple   Destruction method: cryotherapy   Informed consent: discussed and consent obtained   Timeout:  patient name, date of birth, surgical site, and procedure verified Lesion destroyed using liquid nitrogen: Yes   Region frozen until ice ball extended beyond lesion: Yes   Outcome: patient tolerated procedure well with no complications   Post-procedure details: wound care instructions given    Intertrigo Right Inframammary Fold Intertrigo is a chronic recurrent rash that occurs in skin fold areas that may be associated with friction; heat; moisture; yeast; fungus; and bacteria.  It is exacerbated by increased movement / activity; sweating; and higher atmospheric temperature.  Start ketoconazole cream BID until clear  ketoconazole (NIZORAL) 2 % cream - Right Inframammary Fold Apply 1 application topically 2 (two) times daily. Until clear  Lentigines - Scattered tan macules - Due to sun exposure - Benign-appering, observe - Recommend daily broad spectrum sunscreen SPF 30+ to sun-exposed areas, reapply every 2 hours as needed. - Call for any changes  Seborrheic Keratoses - Stuck-on, waxy, tan-brown papules and/or plaques  - Benign-appearing - Discussed benign etiology  and prognosis. - Observe - Call for any changes  Melanocytic Nevi - Tan-brown and/or pink-flesh-colored symmetric macules and papules - Benign appearing on exam today - Observation - Call clinic for new or changing moles - Recommend daily use of broad spectrum spf 30+ sunscreen to sun-exposed areas.   Hemangiomas - Red papules -  Discussed benign nature - Observe - Call for any changes  Purpura - Chronic; persistent and recurrent.  Treatable, but not curable. - Violaceous macules and patches - Benign - Related to trauma, age, sun damage and/or use of blood thinners, chronic use of topical and/or oral steroids - Observe - Can use OTC arnica containing moisturizer such as Dermend Bruise Formula if desired - Call for worsening or other concerns  Actinic Damage - Chronic condition, secondary to cumulative UV/sun exposure - diffuse scaly erythematous macules with underlying dyspigmentation - Recommend daily broad spectrum sunscreen SPF 30+ to sun-exposed areas, reapply every 2 hours as needed.  - Staying in the shade or wearing long sleeves, sun glasses (UVA+UVB protection) and wide brim hats (4-inch brim around the entire circumference of the hat) are also recommended for sun protection.  - Call for new or changing lesions.  Skin cancer screening performed today.  History of Squamous Cell Carcinoma of the Skin - No evidence of recurrence today - No lymphadenopathy - Recommend regular full body skin exams - Recommend daily broad spectrum sunscreen SPF 30+ to sun-exposed areas, reapply every 2 hours as needed.  - Call if any new or changing lesions are noted between office visits  Return in about 1 year (around 01/09/2022) for TBSE.  IHarriett Sine, CMA, am acting as scribe for Sarina Ser, MD. Documentation: I have reviewed the above documentation for accuracy and completeness, and I agree with the above.  Sarina Ser, MD

## 2021-01-12 ENCOUNTER — Encounter: Payer: Self-pay | Admitting: Dermatology

## 2021-01-14 ENCOUNTER — Telehealth (INDEPENDENT_AMBULATORY_CARE_PROVIDER_SITE_OTHER): Payer: Self-pay | Admitting: Vascular Surgery

## 2021-01-14 NOTE — Telephone Encounter (Signed)
I called and spoke with Stacey [name] and made him aware of the NP's instructions.

## 2021-01-14 NOTE — Telephone Encounter (Signed)
Typically the same treatments for swelling are indicated, including use of her compression socks as well as elevation.  However because this is due to her surgery I would also reach out to her surgeon to see what they suggest to help their postoperative patients.

## 2021-01-14 NOTE — Telephone Encounter (Signed)
Right legs keep swelling, patient spouse states she had bypass surgery and they had to remove some veins from leg.  Patient needs information on how to keep swelling down.  Please advise.

## 2021-01-14 NOTE — Telephone Encounter (Signed)
Please advise 

## 2021-02-01 ENCOUNTER — Other Ambulatory Visit: Payer: Self-pay | Admitting: Family Medicine

## 2021-02-01 DIAGNOSIS — F5101 Primary insomnia: Secondary | ICD-10-CM

## 2021-02-01 NOTE — Telephone Encounter (Signed)
Requested medication (s) are due for refill today: yes  Requested medication (s) are on the active medication list: yes  Last refill:  Ambien: 07/26/20     Diazepam: 12/21/18  Future visit scheduled: no  Notes to clinic:  neither med delegated to NT to RF   Requested Prescriptions  Pending Prescriptions Disp Refills   zolpidem (AMBIEN) 5 MG tablet [Pharmacy Med Name: ZOLPIDEM TARTRATE 5 MG TAB] 30 tablet     Sig: TAKE ONE TABLET BY MOUTH AT BEDTIME AS NEEDED FOR SLEEP     Not Delegated - Psychiatry:  Anxiolytics/Hypnotics Failed - 02/01/2021  9:02 AM      Failed - This refill cannot be delegated      Failed - Urine Drug Screen completed in last 360 days      Passed - Valid encounter within last 6 months    Recent Outpatient Visits           5 months ago Cellulitis of right lower extremity   [facility] Fenton Malling M, [place]   6 months ago Cellulitis of right lower extremity   [facility] [name], [place]   10 months ago Essential hypertension   [facility] [name], [place]   1 year ago Cough   [facility] [name], [place]   1 year ago Venous insufficiency of right leg   [facility] [name], [place]       Future Appointments             In 11 months Ralene Bathe, MD [place]             diazepam (VALIUM) 5 MG tablet [Pharmacy Med Name: DIAZEPAM 5 MG TAB] 60 tablet     Sig: TAKE ONE TABLET EVERY 12 HOURS AS NEEDEDFOR ANXIETY     Not Delegated - Psychiatry:  Anxiolytics/Hypnotics Failed - 02/01/2021  9:02 AM      Failed - This refill cannot be delegated      Failed - Urine Drug Screen completed in last 360 days      Passed - Valid encounter within last 6 months    Recent Outpatient Visits           5 months ago Cellulitis of right lower extremity   [place], [place]   6 months ago Cellulitis  of right lower extremity   [name] [place]   10 months ago Essential hypertension   [facility] [name], [facility]   1 year ago Cough   [facility] [name]   1 year ago Venous insufficiency of right leg   [facility] [name], [facility]       Future Appointments             In 11 months Ralene Bathe, MD [place]

## 2021-02-07 NOTE — Telephone Encounter (Signed)
Please review. Thanks!  

## 2021-02-11 ENCOUNTER — Telehealth: Payer: Self-pay | Admitting: Family Medicine

## 2021-02-11 DIAGNOSIS — L03115 Cellulitis of right lower limb: Secondary | ICD-10-CM

## 2021-02-11 MED ORDER — DOXYCYCLINE HYCLATE 100 MG PO TABS: 100.0000 mg | ORAL_TABLET | Freq: Two times a day (BID) | ORAL | 0 refills | Status: AC

## 2021-02-11 NOTE — Telephone Encounter (Signed)
Sent into the pharmacy.  

## 2021-02-11 NOTE — Telephone Encounter (Signed)
Pts husband called and stated that pt has cellulitis and needs a refill of the medication that Dr. [name] prescribed/ he doesn't know the name of the med and stated it should be on file/ please advise

## 2021-02-11 NOTE — Telephone Encounter (Signed)
Patient was treated with doxy in 08/2020. Patient's husband reports that this has flared up again. She is unable to come into the office due to her sister passing away this morning. He was wondering if we could send this in for them? [facility]. Please advise. Thanks!

## 2021-02-17 ENCOUNTER — Other Ambulatory Visit: Payer: Self-pay

## 2021-02-17 ENCOUNTER — Ambulatory Visit (INDEPENDENT_AMBULATORY_CARE_PROVIDER_SITE_OTHER): Payer: Medicare Other | Admitting: Dermatology

## 2021-02-17 DIAGNOSIS — L304 Erythema intertrigo: Secondary | ICD-10-CM

## 2021-02-17 DIAGNOSIS — I25709 Atherosclerosis of coronary artery bypass graft(s), unspecified, with unspecified angina pectoris: Secondary | ICD-10-CM

## 2021-02-17 DIAGNOSIS — B372 Candidiasis of skin and nail: Secondary | ICD-10-CM

## 2021-02-17 MED ORDER — FLUCONAZOLE 200 MG PO TABS: ORAL_TABLET | ORAL | 0 refills | Status: DC

## 2021-02-17 MED ORDER — MOMETASONE FUROATE 0.1 % EX CREA: 1.0000 "application " | TOPICAL_CREAM | Freq: Every day | CUTANEOUS | 2 refills | Status: AC | PRN

## 2021-02-17 NOTE — Patient Instructions (Signed)

## 2021-02-17 NOTE — Progress Notes (Signed)
   Follow-Up Visit   Subjective  Stacey [name] is a 80 y.o. female who presents for the following: Rash (4 weeks f/u on a rash under the left breast, treating with Ketoconazole cream with a poor response ).  The following portions of the chart were reviewed this encounter and updated as appropriate:   Tobacco  Allergies  Meds  Problems  Med Hx  Surg Hx  Fam Hx     Review of Systems:  No other skin or systemic complaints except as noted in HPI or Assessment and Plan.  Objective  Well appearing patient in no apparent distress; mood and affect are within normal limits.  A focused examination was performed including face,left inframammary. Relevant physical exam findings are noted in the Assessment and Plan.  Left Inframammary Fold Erythema and maceration         Assessment & Plan  Intertrigo -severe and persistent not responding to treatment.  Not to goal. 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  It is exacerbated by increased movement / activity; sweating; and higher atmospheric temperature.  Related Medications fluconazole (DIFLUCAN) 200 MG tablet Take 1 tablet on Monday and take 1 tablet on Thursday, repeat for 4 weeks  mometasone (ELOCON) 0.1 % cream Apply 1 application topically daily as needed (Rash).  Return in about 4 weeks (around 03/17/2021).  IMarye Round, CMA, am acting as scribe for Sarina Ser, MD .  Documentation: I have reviewed the above documentation for accuracy and completeness, and I agree with the above.  Sarina Ser, MD

## 2021-02-20 ENCOUNTER — Encounter: Payer: Self-pay | Admitting: Dermatology

## 2021-02-24 ENCOUNTER — Other Ambulatory Visit: Payer: Self-pay | Admitting: Family Medicine

## 2021-02-24 NOTE — Telephone Encounter (Signed)
Requested medication (s) are due for refill today: yes  Requested medication (s) are on the active medication list: yes   Last refill: 12/26/19  #90  3 refills  Future visit scheduled no  Notes to clinic: FAiled due to labs, last draw 12/26/19, please review  Requested Prescriptions  Pending Prescriptions Disp Refills   levothyroxine (SYNTHROID) 100 MCG tablet [Pharmacy Med Name: LEVOTHYROXINE SODIUM 100 MCG TAB] 90 tablet 3    Sig: TAKE ONE TABLET BY MOUTH EVERY DAY     Endocrinology:  Hypothyroid Agents Failed - 02/24/2021  8:37 AM      Failed - TSH needs to be rechecked within 3 months after an abnormal result. Refill until TSH is due.      Failed - TSH in normal range and within 360 days    TSH  Date Value Ref Range Status  12/26/2019 3.750 0.450 - 4.500 uIU/mL Final          Passed - Valid encounter within last 12 months    Recent Outpatient Visits           6 months ago Cellulitis of right lower extremity   [place], [place]   7 months ago Cellulitis of right lower extremity   [facility] [name] [place]   10 months ago Essential hypertension   [facility] [name], [place]   1 year ago Cough   [facility] [name], [place]   1 year ago Venous insufficiency of right leg   [facility] [name], [facility]       Future Appointments             In 3 weeks Ralene Bathe, MD [place]   In 10 months Ralene Bathe, MD [place]

## 2021-03-10 ENCOUNTER — Other Ambulatory Visit: Payer: Self-pay | Admitting: Family Medicine

## 2021-03-10 DIAGNOSIS — E785 Hyperlipidemia, unspecified: Secondary | ICD-10-CM

## 2021-03-17 ENCOUNTER — Other Ambulatory Visit: Payer: Self-pay

## 2021-03-17 ENCOUNTER — Ambulatory Visit (INDEPENDENT_AMBULATORY_CARE_PROVIDER_SITE_OTHER): Payer: Medicare Other | Admitting: Dermatology

## 2021-03-17 DIAGNOSIS — I25709 Atherosclerosis of coronary artery bypass graft(s), unspecified, with unspecified angina pectoris: Secondary | ICD-10-CM

## 2021-03-17 DIAGNOSIS — L304 Erythema intertrigo: Secondary | ICD-10-CM

## 2021-03-17 MED ORDER — FLUCONAZOLE 200 MG PO TABS: ORAL_TABLET | ORAL | 0 refills | Status: DC

## 2021-03-17 NOTE — Progress Notes (Signed)
   Follow-Up Visit   Subjective  Stacey [name] is a 80 y.o. female who presents for the following: Follow-up (Patient here today for 4 week intertrigo follow up. Patient took diflucan 200 mg twice weekly x 4 weeks and is using mometasone at inframammary. Patient advises the rash has improved significantly. ).  Patient previously used ketoconazole cream but rash did not improve.   The following portions of the chart were reviewed this encounter and updated as appropriate:   Tobacco  Allergies  Meds  Problems  Med Hx  Surg Hx  Fam Hx     Review of Systems:  No other skin or systemic complaints except as noted in HPI or Assessment and Plan.  Objective  Well appearing patient in no apparent distress; mood and affect are within normal limits.  A focused examination was performed including inframammary. Relevant physical exam findings are noted in the Assessment and Plan.  Left Inframammary Fold Minimal pinkness, much improved   Assessment & Plan  Erythema intertrigo Chronic and persistent but improved with recent more aggressive treatment. Left Inframammary Fold  May continue mometasone cream as needed for rash.  Will send in Diflucan refill for patient to use with recurrence.  Patient will call and advise if rash worsens and she needs to refill  Much improved, not completely to goal.  Intertrigo is a chronic recurrent rash that occurs in skin fold areas that may be associated with friction; heat; moisture; yeast; fungus; and bacteria.  It is exacerbated by increased movement / activity; sweating; and higher atmospheric temperature.  Related Medications mometasone (ELOCON) 0.1 % cream Apply 1 application topically daily as needed (Rash).  fluconazole (DIFLUCAN) 200 MG tablet Take 1 tablet on Monday and take 1 tablet on Thursday, repeat for 4 weeks  Return for TBSE, as scheduled.  Graciella Belton, RMA, am acting as scribe for Sarina Ser, MD . Documentation: I  have reviewed the above documentation for accuracy and completeness, and I agree with the above.  Sarina Ser, MD

## 2021-03-17 NOTE — Patient Instructions (Signed)

## 2021-03-18 ENCOUNTER — Encounter: Payer: Self-pay | Admitting: Dermatology

## 2021-03-22 ENCOUNTER — Other Ambulatory Visit: Payer: Self-pay | Admitting: Family Medicine

## 2021-03-22 NOTE — Telephone Encounter (Signed)
Pt needs OV for refills. Called pt and LM on VM.  Last RF 02/24/21 #30 with note no refills until OV made.  Requested Prescriptions  Pending Prescriptions Disp Refills   levothyroxine (SYNTHROID) 100 MCG tablet [Pharmacy Med Name: LEVOTHYROXINE SODIUM 100 MCG TAB] 30 tablet 0    Sig: TAKE ONE TABLET BY MOUTH EVERY DAY     Endocrinology:  Hypothyroid Agents Failed - 03/22/2021  8:56 AM      Failed - TSH needs to be rechecked within 3 months after an abnormal result. Refill until TSH is due.      Failed - TSH in normal range and within 360 days    TSH  Date Value Ref Range Status  12/26/2019 3.750 0.450 - 4.500 uIU/mL Final          Passed - Valid encounter within last 12 months    Recent Outpatient Visits           6 months ago Cellulitis of right lower extremity   [place], [place]   8 months ago Cellulitis of right lower extremity   [facility] [name], [place]   11 months ago Essential hypertension   [facility] [name], [place]   1 year ago Cough   [facility] [name], [facility]   1 year ago Venous insufficiency of right leg   [facility] [name], [facility]       Future Appointments             In 9 months Ralene Bathe, MD [place]

## 2021-03-24 NOTE — Telephone Encounter (Signed)
[facility] faxed refill request for the following medications:   metoprolol tartrate (LOPRESSOR) 25 MG tablet  Please advise.

## 2021-03-26 ENCOUNTER — Telehealth: Payer: Self-pay | Admitting: Family Medicine

## 2021-03-26 ENCOUNTER — Other Ambulatory Visit: Payer: Self-pay

## 2021-03-26 MED ORDER — METOPROLOL TARTRATE 25 MG PO TABS: ORAL_TABLET | ORAL | 0 refills | Status: AC

## 2021-03-26 NOTE — Telephone Encounter (Signed)
[facility] faxed refill request for the following medications:  metoprolol tartrate 37.5 MG TABS    Please advise.

## 2021-03-29 ENCOUNTER — Other Ambulatory Visit: Payer: Self-pay | Admitting: Family Medicine

## 2021-03-29 DIAGNOSIS — F411 Generalized anxiety disorder: Secondary | ICD-10-CM

## 2021-03-29 NOTE — Telephone Encounter (Signed)
Requested medication (s) are due for refill today: yes  Requested medication (s) are on the active medication list: yes  Last refill:  12/28/20 #90  Future visit scheduled: no  Notes to clinic:  Called pt and made appt= pt then canceled "stating that she sees no reason that she needs to come in office." Advised pt that an office visit is important so her physician can see how the drug is working and if needs to be adjusted. Advised appts are necessary for refills. Pt asked if her prescription will be filled. Advised pt unable to refill because refused needing to be seen.    Requested Prescriptions  Pending Prescriptions Disp Refills   sertraline (ZOLOFT) 25 MG tablet [Pharmacy Med Name: SERTRALINE HCL 25 MG TAB] 90 tablet 0    Sig: TAKE 1 TABLET BY MOUTH DAILY     Psychiatry:  Antidepressants - SSRI Failed - 03/29/2021  8:24 AM      Failed - Completed PHQ-2 or PHQ-9 in the last 360 days      Failed - Valid encounter within last 6 months    Recent Outpatient Visits           7 months ago Cellulitis of right lower extremity   [place], [place]   8 months ago Cellulitis of right lower extremity   [name] [place]   11 months ago Essential hypertension   [facility] [name], [facility]   1 year ago Cough   [facility] [name], [facility]   1 year ago Venous insufficiency of right leg   [facility] [name], [facility]       Future Appointments             In 9 months Ralene Bathe, MD [place]

## 2021-04-26 ENCOUNTER — Other Ambulatory Visit: Payer: Self-pay | Admitting: Family Medicine

## 2021-04-27 NOTE — Telephone Encounter (Signed)
Requested medication (s) are due for refill today: yes to both meds  Requested medication (s) are on the active medication list: yes  Last refill:  metoprolol:03/26/21 #90        levothyroxine: 03/24/21 #30  Future visit scheduled: no  Notes to clinic:   called pt and LM on VM to call office to make appt. Call back number provided   Requested Prescriptions  Pending Prescriptions Disp Refills   metoprolol tartrate (LOPRESSOR) 25 MG tablet [Pharmacy Med Name: METOPROLOL TARTRATE 25 MG TAB] 90 tablet 0    Sig: TAKE ONE AND A HALF TABLETS BY MOUTH TWICE DAILY (MUST BE SEEN FOR FURTHER REFILLS)     Cardiovascular:  Beta Blockers Failed - 04/26/2021  9:01 AM      Failed - Last BP in normal range    BP Readings from Last 1 Encounters:  08/27/20 116/73          Failed - Valid encounter within last 6 months    Recent Outpatient Visits           8 months ago Cellulitis of right lower extremity   [facility] Fenton Malling M, [place]   9 months ago Cellulitis of right lower extremity   [facility] [name], [place]   1 year ago Essential hypertension   [facility] [name], [facility]   1 year ago Cough   [facility] [name], [place]   1 year ago Venous insufficiency of right leg   [place] [name], MD       Future Appointments             In 8 months Ralene Bathe, MD [place]            Passed - Last Heart Rate in normal range    Pulse Readings from Last 1 Encounters:  08/27/20 83           levothyroxine (SYNTHROID) 100 MCG tablet [Pharmacy Med Name: LEVOTHYROXINE SODIUM 100 MCG TAB] 30 tablet 0    Sig: TAKE ONE TABLET BY MOUTH EVERY DAY     Endocrinology:  Hypothyroid Agents Failed - 04/26/2021  9:01 AM      Failed - TSH needs to be rechecked within 3 months after an abnormal result. Refill until TSH is due.      Failed - TSH in  normal range and within 360 days    TSH  Date Value Ref Range Status  12/26/2019 3.750 0.450 - 4.500 uIU/mL Final          Passed - Valid encounter within last 12 months    Recent Outpatient Visits           8 months ago Cellulitis of right lower extremity   [place], [place]   9 months ago Cellulitis of right lower extremity   [facility] [name], [place]   1 year ago Essential hypertension   [facility] [name], [facility]   1 year ago Cough   [facility] [name], [facility]   1 year ago Venous insufficiency of right leg   [facility] [name], [facility]       Future Appointments             In 8 months Ralene Bathe, MD [place]

## 2021-04-29 ENCOUNTER — Other Ambulatory Visit: Payer: Self-pay | Admitting: Family Medicine

## 2021-04-29 ENCOUNTER — Other Ambulatory Visit: Payer: Self-pay | Admitting: Dermatology

## 2021-04-29 DIAGNOSIS — E039 Hypothyroidism, unspecified: Secondary | ICD-10-CM

## 2021-04-30 NOTE — Telephone Encounter (Signed)
Patient has follow up appt with Mendel Ryder on Friday.

## 2021-05-01 DIAGNOSIS — M19011 Primary osteoarthritis, right shoulder: Secondary | ICD-10-CM | POA: Diagnosis not present

## 2021-05-01 NOTE — Progress Notes (Signed)
Established patient visit   Patient: Stacey [name]   DOB: [birth year]   80 y.o. Female  MRN: [id number] Visit Date: 05/02/2021  Today's healthcare provider: Mikey Kirschner, PA-C   Cc. Med refills  Subjective    [name] is a 80 y/o female who presents today for refills of 'all' her medications. She states she needs two blood pressure medications and her thyroid medication refilled, but she is unsure exactly which medications these are which as her husband manages all of them. She follows with a cardiologist, next visit is 06/2021. Reports feeling overall well today, does not remember any home BP measurements, but denies CP, dizziness, headaches.  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 Nothing is worse than usual today.  No concerns or issues to discuss today.  Medications: Outpatient Medications Prior to Visit  Medication Sig   acyclovir (ZOVIRAX) 400 MG tablet Take 400 mg by mouth 3 times per day for 5 days to start immediately with each episode of fever blister. May also take one a day for prevention.   albuterol (VENTOLIN HFA) 108 (90 Base) MCG/ACT inhaler INHALE 2 PUFFS BY MOUTH EVERY 4 TO 6 HOURS AS NEEDED FOR SHORTNESS OF BREATH   amLODipine (NORVASC) 5 MG tablet Take 1 tablet (5 mg total) daily by mouth.   apixaban (ELIQUIS) 5 MG TABS tablet Take 1 tablet (5 mg total) by mouth 2 (two) times daily.   ascorbic acid (VITAMIN C) 500 MG tablet Take 2,000 mg by mouth daily.    aspirin 81 MG tablet Take 1 tablet (81 mg total) by mouth daily.   buPROPion (WELLBUTRIN XL) 300 MG 24 hr tablet TAKE ONE TABLET BY MOUTH EVERY DAY   chlorthalidone (HYGROTON) 25 MG tablet TAKE 1/2 TABLET BY MOUTH DAILY.   Cholecalciferol (VITAMIN D) 2000 units tablet Take 2,000 Units by mouth daily.   Cyanocobalamin (B-12) 500 MCG TABS Take 500 mcg by mouth daily.    diazepam (VALIUM) 5 MG tablet Take 1 tablet (5 mg total) by mouth every 12  (twelve) hours as needed for anxiety. Takes 1/2 tablet   ELDERBERRY PO Take by mouth daily. Unsure dose / combination vitamin   EPINEPHrine (EPIPEN 2-PAK) 0.3 mg/0.3 mL IJ SOAJ injection INJECT AS DIRECTED FOR SEVERE ALLERGIC REACTION (Patient taking differently: Inject 0.3 mg into the muscle as needed (for severe allergic reaction).)   fluconazole (DIFLUCAN) 200 MG tablet Take 1 tablet on Monday and take 1 tablet on Thursday, repeat for 4 weeks   fluocinonide gel (LIDEX) 1.19 % Apply 1 application topically as needed.   levothyroxine (SYNTHROID) 100 MCG tablet TAKE ONE TABLET BY MOUTH EVERY DAY   metoprolol tartrate (LOPRESSOR) 25 MG tablet One and one half tabs twice daily   mometasone (ELOCON) 0.1 % cream Apply 1 application topically daily as needed (Rash).   Multiple Vitamin (MULTI-VITAMINS) TABS Take 1 tablet by mouth daily.   nystatin (MYCOSTATIN) 100000 UNIT/ML suspension Take 100,000 Units by mouth 3 (three) times daily as needed. For mouth ulcers.   oxymetazoline (AFRIN) 0.05 % nasal spray Place 1 spray into both nostrils as needed for congestion.   rosuvastatin (CRESTOR) 20 MG tablet TAKE ONE TABLET (20 MG) BY MOUTH EVERY EVENING   sertraline (ZOLOFT) 25 MG tablet TAKE 1 TABLET BY MOUTH DAILY   tacrolimus (PROTOPIC) 0.1 % ointment As needed for rosacea flare ups   zinc gluconate 50 MG tablet Take 50 mg by  mouth daily.    zolpidem (AMBIEN) 5 MG tablet TAKE ONE TABLET BY MOUTH AT BEDTIME AS NEEDED FOR SLEEP   [DISCONTINUED] lisinopril (PRINIVIL,ZESTRIL) 40 MG tablet TAKE 1 TABLET BY MOUTH EVERY DAY   [DISCONTINUED] traMADol (ULTRAM) 50 MG tablet Take 1 tablet (50 mg total) by mouth every 6 (six) hours as needed for moderate pain.   isosorbide mononitrate (IMDUR) 30 MG 24 hr tablet Take 30 mg by mouth daily.   [DISCONTINUED] acetaminophen (TYLENOL) 325 MG tablet Take 2 tablets (650 mg total) by mouth every 6 (six) hours as needed for mild pain. (Patient not taking: Reported on 05/02/2021)    [DISCONTINUED] Garlic 371 MG TABS Take 500 mg by mouth daily. Every other day (Patient not taking: Reported on 05/02/2021)   No facility-administered medications prior to visit.    Review of Systems  Constitutional:  Positive for fatigue. Negative for fever.  Respiratory:  Positive for shortness of breath. Negative for cough and chest tightness.   Cardiovascular:  Positive for palpitations and leg swelling. Negative for chest pain.  Neurological:  Negative for dizziness, light-headedness and headaches.  Psychiatric/Behavioral:  Positive for sleep disturbance.    Last CBC Lab Results  Component Value Date   WBC 5.5 12/26/2019   HGB 14.5 12/26/2019   HCT 45.0 12/26/2019   MCV 90 12/26/2019   MCH 28.9 12/26/2019   RDW 12.3 12/26/2019   PLT 161 11/18/9483   Last metabolic panel Lab Results  Component Value Date   GLUCOSE 122 (H) 12/26/2019   NA 137 12/26/2019   K 4.2 12/26/2019   CL 97 12/26/2019   CO2 25 12/26/2019   BUN 17 12/26/2019   CREATININE 1.03 (H) 12/26/2019   GFRNONAA 53 (L) 12/26/2019   CALCIUM 9.2 12/26/2019   PROT 6.4 12/26/2019   ALBUMIN 4.5 12/26/2019   LABGLOB 1.9 12/26/2019   AGRATIO 2.4 (H) 12/26/2019   BILITOT 0.4 12/26/2019   ALKPHOS 90 12/26/2019   AST 19 12/26/2019   ALT 14 12/26/2019   ANIONGAP 9 10/25/2018   Last lipids Lab Results  Component Value Date   CHOL 172 12/26/2019   HDL 65 12/26/2019   LDLCALC 89 12/26/2019   TRIG 100 12/26/2019   CHOLHDL 2.6 12/26/2019   Last hemoglobin A1c Lab Results  Component Value Date   HGBA1C 5.5 10/25/2018   Last thyroid functions Lab Results  Component Value Date   TSH 3.750 12/26/2019   T4TOTAL 9.9 06/21/2015   Last vitamin D Lab Results  Component Value Date   VD25OH 19.8 (L) 11/30/2014   Last vitamin B12 and Folate No results found for: VITAMINB12, FOLATE     Objective    BP 118/78 (BP Location: Right Arm, Patient Position: Sitting, Cuff Size: Normal)   Pulse 71   Temp 98.3 F  (36.8 C) (Oral)   Wt 186 lb (84.4 kg)   SpO2 100%   BMI 35.14 kg/m  BP Readings from Last 3 Encounters:  05/02/21 118/78  08/27/20 116/73  07/25/20 128/74   Wt Readings from Last 3 Encounters:  05/02/21 186 lb (84.4 kg)  08/27/20 189 lb (85.7 kg)  07/25/20 195 lb (88.5 kg)      Physical Exam Constitutional:      General: She is awake.     Appearance: She is well-developed.  HENT:     Head: Normocephalic.  Eyes:     Conjunctiva/sclera: Conjunctivae normal.  Cardiovascular:     Rate and Rhythm: Normal rate and regular rhythm.  Heart sounds: Normal heart sounds.  Pulmonary:     Effort: Pulmonary effort is normal.     Breath sounds: Normal breath sounds.  Musculoskeletal:     Right lower leg: 1+ Pitting Edema present.     Left lower leg: No edema.     Comments: No erythema, excessive warmth to right lower extremity. Nontender.  Skin:    General: Skin is warm.  Neurological:     Mental Status: She is alert and oriented to person, place, and time.  Psychiatric:        Attention and Perception: Attention normal.        Mood and Affect: Mood normal.        Speech: Speech normal.        Behavior: Behavior is cooperative.    No results found for any visits on 05/02/21.  Assessment & Plan     Problem List Items Addressed This Visit       Cardiovascular and Mediastinum   Essential hypertension - Primary    Appears to be well controlled. We reviewed her current medications exensively, she did bring a list with her today but it may be outdated.  She currently takes: Amlodipine 5 mg, Metoprolol 25 mg , 2 pills in am and one in PM, and on her list, HCTZ 12.5 -- but her cardiologist refilled Chlorthalidone 25 mg this week. In my med list, I see Lisinopril 40 mg, but this is not on her list, and the cardiologist has not recorded it either.  Advised she review this list with her husband. Meds that she needed were refilled with cardio. Advised if she needs any additional,  to call office with the name.  Ref for pharmacy to review meds w/ pt      Relevant Orders   AMB Referral to [facility]ordin[facility]   Comprehensive Metabolic Panel (CMET)   PAF (paroxysmal atrial fibrillation) (HCC)    Chronic anticoag with Eliquis, pt does not think she needs a refill      Relevant Orders   AMB Referral to [place]     Endocrine   Adult hypothyroidism    Levothyroxine refilled by another provider, per pt she has TSH checked by them? But I am unable to see this. Has been stable previously.      Relevant Orders   AMB Referral to [facility] Coordinaton     Other   Clinical depression    Pt still feels symptoms of depression, but the most worrisome of her symptoms stem from her cardiac issues--shortness of breath, fatigue, exercise intolerance. Continue Wellbutrin and Sertraline. Per pt no refills needed.      Relevant Orders   [facility]B [facility] to [facility]   Hyperlipidemia LDL goal <70    Chronic and has been stable < 100, will recheck, continue statin      Relevant Orders   AMB Referral to [place]   Lipid Profile   Insomnia    Stems from depression/anxiety. She states she takes Zolpidem 2.5 mg (1/2 tab) maybe once a week. Only takes Valium when flying or in tight quarters.      Other Visit Diagnoses     Hyperglycemia       Relevant Orders   HgB A1c      Over 50% of the visit was spent with med reconciliation.   Return in about 4 months (around 08/31/2021) for hypertension, anxiety, hyperlipidemia.      I, Mikey Kirschner, PA-C  have reviewed all documentation for this visit. The documentation on  05/02/2021 for the exam, diagnosis, procedures, and orders are all accurate and complete.    Mikey Kirschner, PA-C  [facility] [phone] (phone) [phone] (fax)  [place]

## 2021-05-02 ENCOUNTER — Ambulatory Visit: Payer: Medicare Other | Admitting: Family Medicine

## 2021-05-02 ENCOUNTER — Encounter: Payer: Self-pay | Admitting: Physician Assistant

## 2021-05-02 ENCOUNTER — Other Ambulatory Visit: Payer: Self-pay

## 2021-05-02 ENCOUNTER — Ambulatory Visit (INDEPENDENT_AMBULATORY_CARE_PROVIDER_SITE_OTHER): Payer: Medicare Other | Admitting: Physician Assistant

## 2021-05-02 VITALS — BP 118/78 | HR 71 | Temp 98.3°F | Wt 186.0 lb

## 2021-05-02 DIAGNOSIS — R739 Hyperglycemia, unspecified: Secondary | ICD-10-CM

## 2021-05-02 DIAGNOSIS — I10 Essential (primary) hypertension: Secondary | ICD-10-CM

## 2021-05-02 DIAGNOSIS — I480 Paroxysmal atrial fibrillation: Secondary | ICD-10-CM

## 2021-05-02 DIAGNOSIS — F5105 Insomnia due to other mental disorder: Secondary | ICD-10-CM | POA: Diagnosis not present

## 2021-05-02 DIAGNOSIS — F99 Mental disorder, not otherwise specified: Secondary | ICD-10-CM

## 2021-05-02 DIAGNOSIS — E785 Hyperlipidemia, unspecified: Secondary | ICD-10-CM

## 2021-05-02 DIAGNOSIS — F324 Major depressive disorder, single episode, in partial remission: Secondary | ICD-10-CM

## 2021-05-02 DIAGNOSIS — E039 Hypothyroidism, unspecified: Secondary | ICD-10-CM | POA: Diagnosis not present

## 2021-05-02 NOTE — Assessment & Plan Note (Addendum)
Appears to be well controlled. We reviewed her current medications exensively, she did bring a list with her today but it may be outdated.  She currently takes: Amlodipine 5 mg, Metoprolol 25 mg , 2 pills in am and one in PM, and on her list, HCTZ 12.5 -- but her cardiologist refilled Chlorthalidone 25 mg this week. In my med list, I see Lisinopril 40 mg, but this is not on her list, and the cardiologist has not recorded it either.  Advised she review this list with her husband. Meds that she needed were refilled with cardio. Advised if she needs any additional, to call office with the name.  Ref for pharmacy to review meds w/ pt

## 2021-05-02 NOTE — Assessment & Plan Note (Signed)
Stems from depression/anxiety. She states she takes Zolpidem 2.5 mg (1/2 tab) maybe once a week. Only takes Valium when flying or in tight quarters.

## 2021-05-02 NOTE — Assessment & Plan Note (Signed)
Chronic anticoag with Eliquis, pt does not think she needs a refill

## 2021-05-02 NOTE — Assessment & Plan Note (Signed)
Chronic and has been stable < 100, will recheck, continue statin

## 2021-05-02 NOTE — Assessment & Plan Note (Signed)
Pt still feels symptoms of depression, but the most worrisome of her symptoms stem from her cardiac issues--shortness of breath, fatigue, exercise intolerance. Continue Wellbutrin and Sertraline. Per pt no refills needed.

## 2021-05-02 NOTE — Assessment & Plan Note (Signed)
Levothyroxine refilled by another provider, per pt she has TSH checked by them? But I am unable to see this. Has been stable previously.

## 2021-05-03 LAB — HEMOGLOBIN A1C
Est. average glucose Bld gHb Est-mCnc: 123 mg/dL
Hgb A1c MFr Bld: 5.9 % — ABNORMAL HIGH (ref 4.8–5.6)

## 2021-05-03 LAB — LIPID PANEL
Chol/HDL Ratio: 2.7 ratio (ref 0.0–4.4)
Cholesterol, Total: 151 mg/dL (ref 100–199)
HDL: 55 mg/dL (ref >39)
LDL Chol Calc (NIH): 71 mg/dL (ref 0–99)
Triglycerides: 147 mg/dL (ref 0–149)
VLDL Cholesterol Cal: 25 mg/dL (ref 5–40)

## 2021-05-03 LAB — COMPREHENSIVE METABOLIC PANEL
ALT: 20 IU/L (ref 0–32)
AST: 26 IU/L (ref 0–40)
Albumin/Globulin Ratio: 2.4 — ABNORMAL HIGH (ref 1.2–2.2)
Albumin: 4.7 g/dL (ref 3.7–4.7)
Alkaline Phosphatase: 79 IU/L (ref 44–121)
BUN/Creatinine Ratio: 28 (ref 12–28)
BUN: 30 mg/dL — ABNORMAL HIGH (ref 8–27)
Bilirubin Total: 0.5 mg/dL (ref 0.0–1.2)
CO2: 29 mmol/L (ref 20–29)
Calcium: 10 mg/dL (ref 8.7–10.3)
Chloride: 94 mmol/L — ABNORMAL LOW (ref 96–106)
Creatinine, Ser: 1.09 mg/dL — ABNORMAL HIGH (ref 0.57–1.00)
Globulin, Total: 2 g/dL (ref 1.5–4.5)
Glucose: 108 mg/dL — ABNORMAL HIGH (ref 70–99)
Potassium: 3.7 mmol/L (ref 3.5–5.2)
Sodium: 136 mmol/L (ref 134–144)
Total Protein: 6.7 g/dL (ref 6.0–8.5)
eGFR: 52 mL/min/{1.73_m2} — ABNORMAL LOW (ref >59)

## 2021-05-05 ENCOUNTER — Other Ambulatory Visit: Payer: Self-pay | Admitting: Physician Assistant

## 2021-05-05 ENCOUNTER — Telehealth: Payer: Self-pay | Admitting: *Deleted

## 2021-05-05 DIAGNOSIS — N1831 Chronic kidney disease, stage 3a: Secondary | ICD-10-CM

## 2021-05-05 NOTE — Chronic Care Management (AMB) (Signed)
  Chronic Care Management   Outreach Note  05/05/2021 Name: LAMICA MCCART MRN: [id number] DOB: 1940-07-10  LARAINA SULTON is a 80 y.o. year old female who is a primary care patient of [name], MD. I reached out to [name] by phone today in response to a referral sent by [name] primary care provider.  An unsuccessful telephone outreach was attempted today. The patient was referred to the case management team for assistance with care management and care coordination.   Follow Up Plan: A HIPAA compliant phone message was left for the patient providing contact information and requesting a return call.  If patient returns call to provider office, please advise to call Embedded Care Management Care Guide Kristian Mogg at [place], [place] Management  Direct Dial: [phone]

## 2021-05-08 ENCOUNTER — Other Ambulatory Visit: Payer: Self-pay | Admitting: Family Medicine

## 2021-05-08 DIAGNOSIS — F5101 Primary insomnia: Secondary | ICD-10-CM

## 2021-05-12 NOTE — Chronic Care Management (AMB) (Signed)
Chronic Care Management   Note  05/12/2021 Name: [name] MRN: [id number] DOB: 09/24/1940  Stacey [name] is a 80 y.o. year old female who is a primary care patient of [name], MD. I reached out to [name] by phone today in response to a referral sent by Stacey [name] PCP.  Stacey [name] was given information about Chronic Care Management services today including:  CCM service includes personalized support from designated clinical staff supervised by her physician, including individualized plan of care and coordination with other care providers 24/7 contact phone numbers for assistance for urgent and routine care needs. Service will only be billed when office clinical staff spend 20 minutes or more in a month to coordinate care. Only one practitioner may furnish and bill the service in a calendar month. The patient may stop CCM services at any time (effective at the end of the month) by phone call to the office staff. The patient is responsible for co-pay (up to 20% after annual deductible is met) if co-pay is required by the individual health plan.   Patient agreed to services and verbal consent obtained.   Follow up plan: Telephone appointment with care management team member scheduled for: 06/03/2021  Julian Hy, [place] Management  Direct Dial: [phone]

## 2021-05-12 NOTE — Chronic Care Management (AMB) (Signed)
°  Chronic Care Management   Outreach Note  05/12/2021 Name: Stacey [name] MRN: [id number] DOB: 1941/04/06  Stacey [name] is a 80 y.o. year old female who is a primary care patient of [name], MD. I reached out to [name] by phone today in response to a referral sent by [name] primary care provider.  A second unsuccessful telephone outreach was attempted today. The patient was referred to the case management team for assistance with care management and care coordination.   Follow Up Plan: A HIPAA compliant phone message was left for the patient providing contact information and requesting a return call.  If patient returns call to provider office, please advise to call Embedded Care Management Care Guide Caydence Koenig at [place], [place] Management  Direct Dial: [phone]

## 2021-05-14 IMAGING — CT CT HEAD WITHOUT CONTRAST
3 series · 16 of 47 positions shown, 19 images · non-contrast
Comparison: None.

CLINICAL DATA: Altered level of consciousness. Headache and
confusion.

EXAM:
CT HEAD WITHOUT CONTRAST
TECHNIQUE: Contiguous axial images were obtained from the base of the skull
through the vertex without intravenous contrast.

[Series 2: head wo · axial · 0.42mm/px · z∈[-62,+63]mm · 10 of 30 slices shown, 13 images]
[im 3/30  brain]
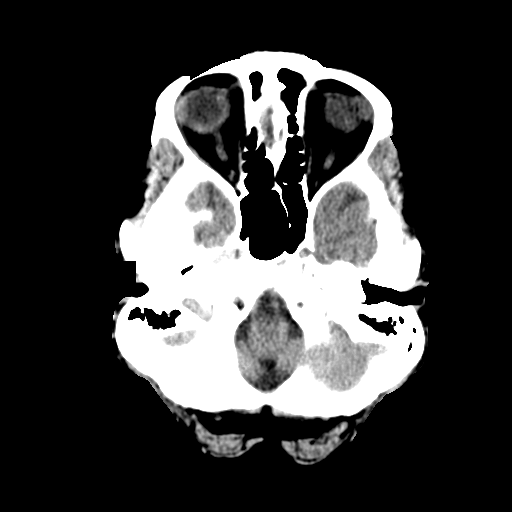
[im 3/30  bone]
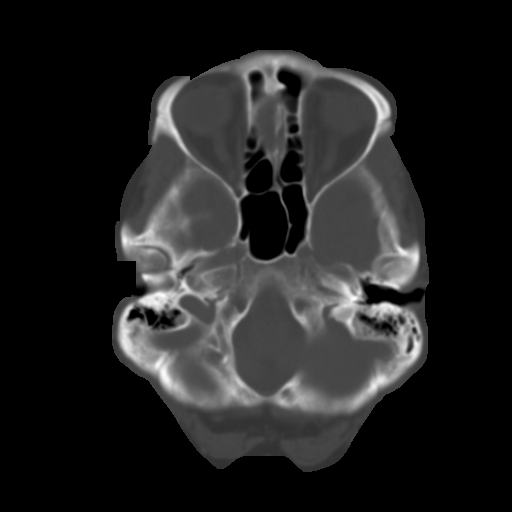
[im 6/30  brain]
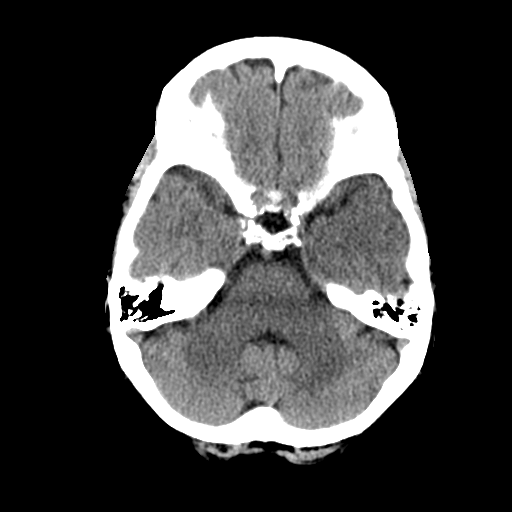
[im 9/30  brain]
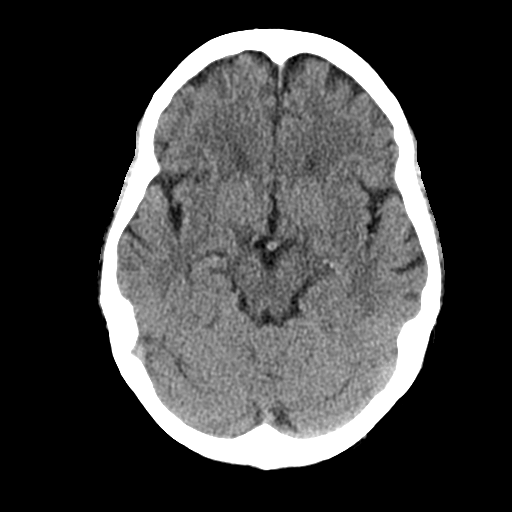
[im 11/30  brain]
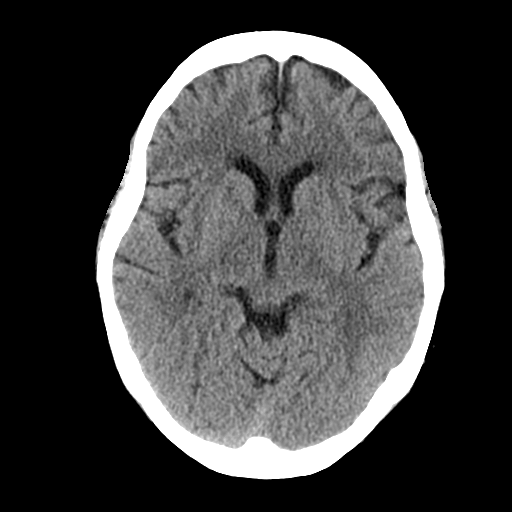
[im 14/30  brain]
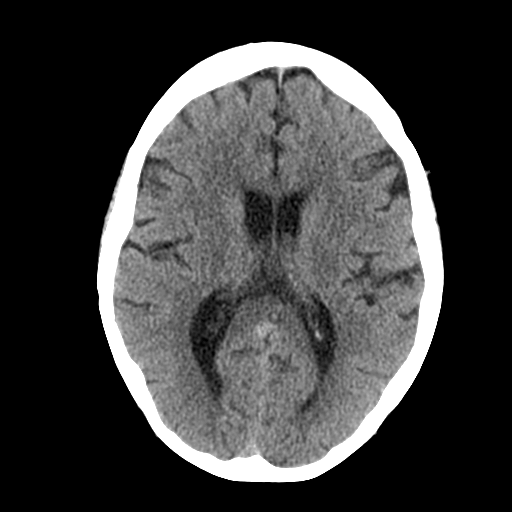
[im 14/30  bone]
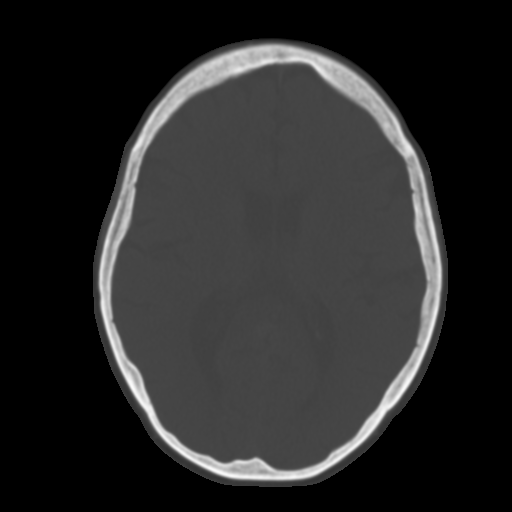
[im 17/30  brain]
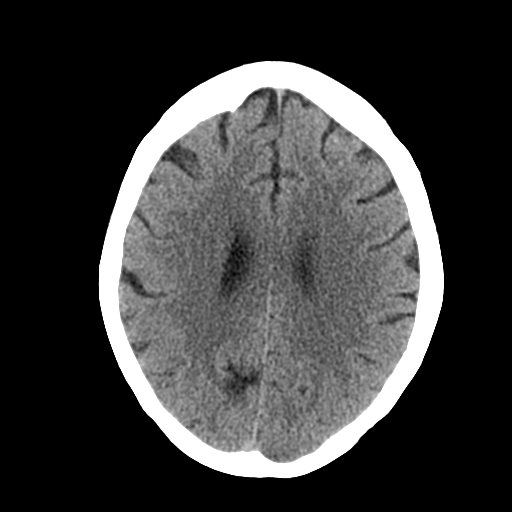
[im 20/30  brain]
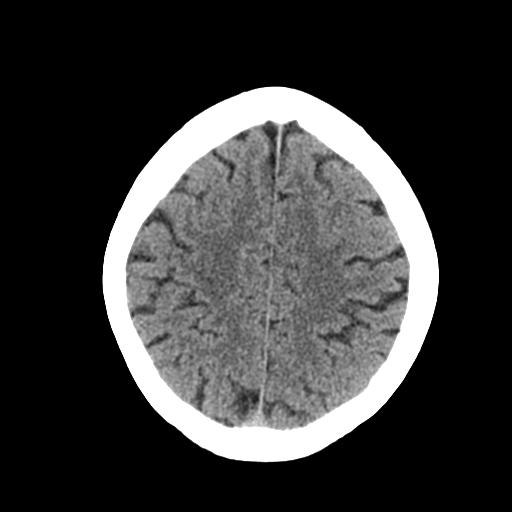
[im 23/30  brain]
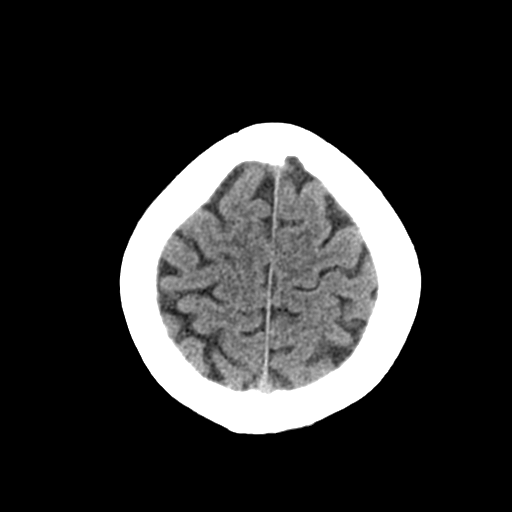
[im 25/30  brain]
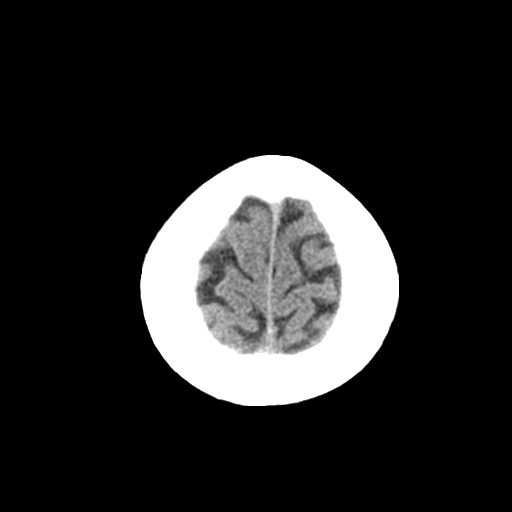
[im 25/30  bone]
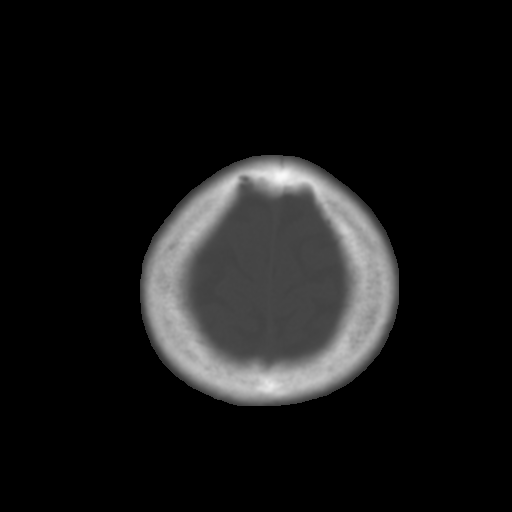
[im 28/30  brain]
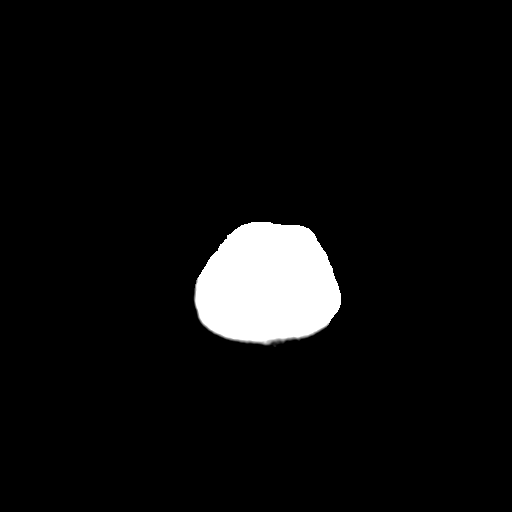

[Series 4: coronal soft tissue · coronal · 0.30mm/px · 3 of 63 slices shown]
[im 21/63  brain]
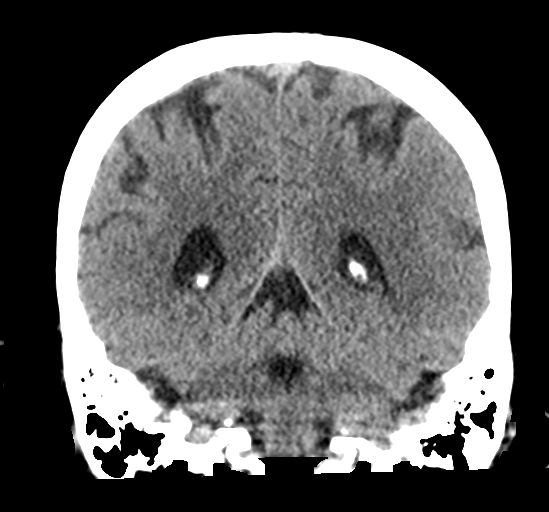
[im 28/63  brain]
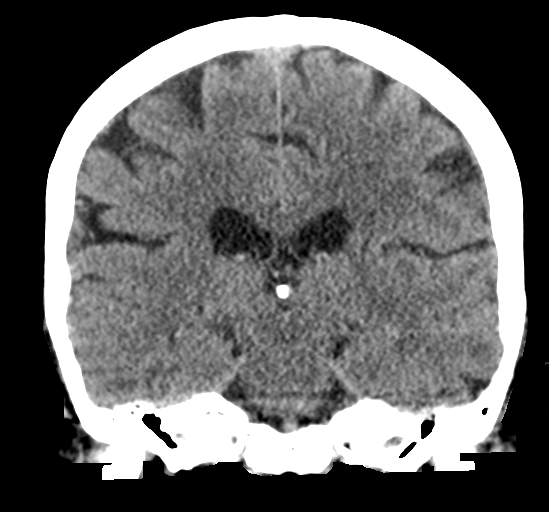
[im 35/63  brain]
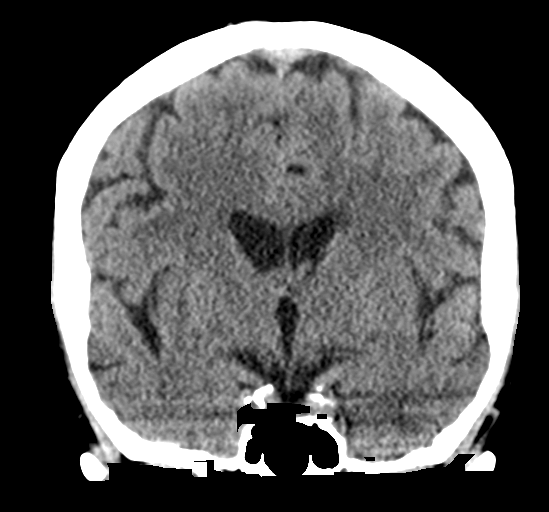

[Series 5: sagittal soft tissue · sagittal · 0.31mm/px · 3 of 51 slices shown]
[im 17/51  brain]
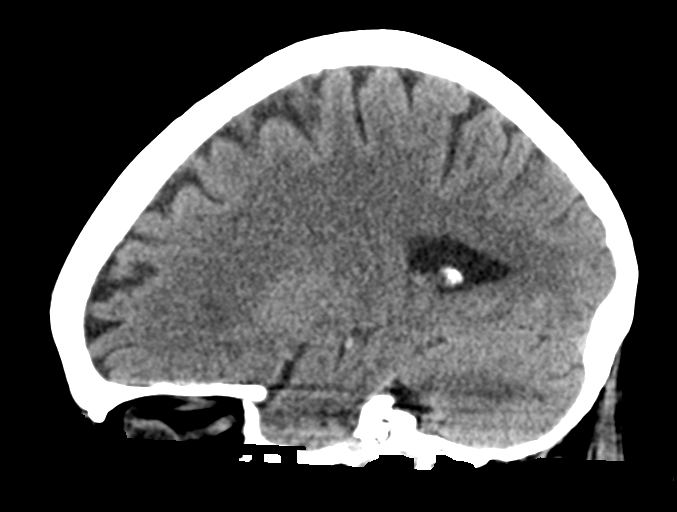
[im 26/51  brain]
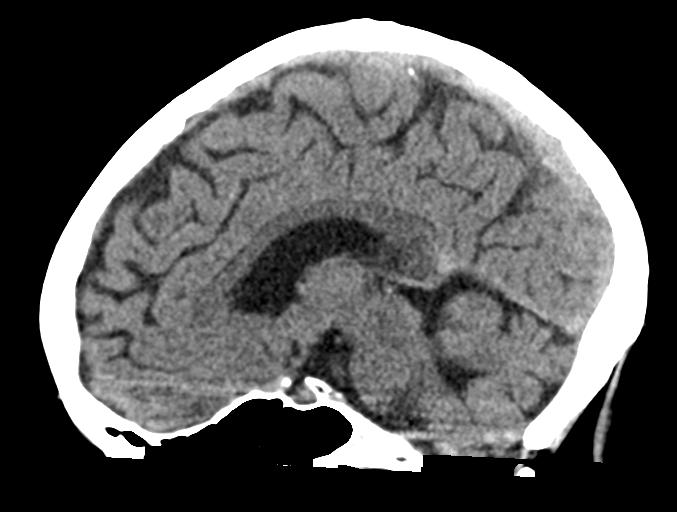
[im 34/51  brain]
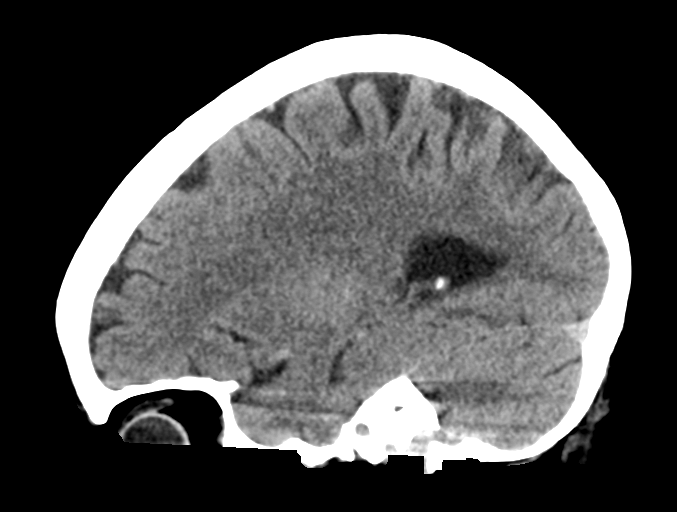

[16 of 47 positions shown; findings below may reference images not displayed]

FINDINGS: Brain: No evidence of acute infarction, hemorrhage, hydrocephalus,
extra-axial collection or mass lesion/mass effect. An old right
parietooccipital infarct is noted.

Vascular: No hyperdense vessel or unexpected calcification.

Skull: Normal. Negative for fracture or focal lesion.

Sinuses/Orbits: No acute finding.

Other: None.
IMPRESSION: No acute intracranial abnormality.

## 2021-05-26 DIAGNOSIS — N3090 Cystitis, unspecified without hematuria: Secondary | ICD-10-CM | POA: Diagnosis not present

## 2021-05-30 ENCOUNTER — Other Ambulatory Visit: Payer: Self-pay | Admitting: Physician Assistant

## 2021-05-30 ENCOUNTER — Other Ambulatory Visit: Payer: Self-pay

## 2021-05-30 DIAGNOSIS — E039 Hypothyroidism, unspecified: Secondary | ICD-10-CM

## 2021-05-30 MED ORDER — LEVOTHYROXINE SODIUM 100 MCG PO TABS: 100.0000 ug | ORAL_TABLET | Freq: Every day | ORAL | 0 refills | Status: DC

## 2021-05-31 LAB — TSH: TSH: 2.56 u[IU]/mL (ref 0.450–4.500)

## 2021-06-02 ENCOUNTER — Telehealth: Payer: Self-pay

## 2021-06-02 ENCOUNTER — Telehealth: Payer: Self-pay | Admitting: *Deleted

## 2021-06-02 MED ORDER — LEVOTHYROXINE SODIUM 100 MCG PO TABS: 100.0000 ug | ORAL_TABLET | Freq: Every day | ORAL | 0 refills | Status: DC

## 2021-06-02 NOTE — Progress Notes (Signed)
Chronic Care Management Pharmacy Assistant   Name: Stacey [name]  MRN: [id number] DOB: 10-08-1940  Initial Visit with 06/03/2021 @ 1330 with Junius Argyle, CPP Initial Visit Question Assessment   Conditions to be addressed/monitored: CAD, HTN, HLD, Depression, and Headache, migraine, Non-Stemi MI, PAF, Moderate Mitral Insufficiency, CVA, TIA, Adult Hypothyroidism, Sciatic pain, right, Arthritis,, degenerative, Other osteoarthritis involving multiple joints, Insomnia, Low Back Pain, Panic attacks,    Primary concerns for visit include: Patient stated the program wasn't explained to her nor was she informed about the Referral. After I explained the program to the patient she stated that this wasn't something she feels like she needs at this time and requested that the appointment be cancelled   Recent office visits:  05/02/2021 Mikey Kirschner, PA-C (PCP Office Visit) for HTN- Stopped: Acetaminophen 846 mg, Garlic 659 mg, Lisinopril 40 mg, Tramadol 50 mg, Lab orders placed, patient instructed to follow-up in 4 months  Recent consult visits:  03/17/2021 Sarina Ser, MD (Dermatology) for Follow-up- No medication changes noted, no orders placed, no follow-up noted  02/17/2021 Sarina Ser, MD (Dermatology) for Rash- Started: Fluconazole 200 mg 1 tablet on Monday and 1 tablet on Thursday X 4 weeks, Mometasone Furoate 0.1% topical daily prn, Stopped: Ketoconazole 2%, no orders placed, patient instructed to follow-up in 4 weeks  01/09/2021 Sarina Ser, MD (Dermatology) for Fever Blister- Started: Acyclovir 400 mg take 3 times per day for 5 days, Ketoconazole 2% 2 times daily, Destruction of lesions orders placed, patient instructed to follow-up in 1 year  Hospital visits:  Medication Reconciliation was completed by comparing discharge summary, patients EMR and Pharmacy list, and upon discussion with patient.  Admitted to the hospital on [facility] due to Cystitis. Discharge  date was 05/26/2021. Dis[facility] from [facility].    New?Medications Started at [facility] Discharge:?? Started: sulfamethoxazole-trimethoprim (Bactrim DS) 800-160 MG tablet Take 1 tablet by mouth in the morning and 1 tablet in the evening. Do all this for 7 days  due to Cystitis  Medication Changes at Hospital Discharge: -Changed None ID  Medications Discontinued at Hospital Discharge: -Stopped None ID  Medications that remain the same after Hospital Discharge:??  -All other medications will remain the same.    Medications: Outpatient Encounter Medications as of 06/02/2021  Medication Sig Note   acyclovir (ZOVIRAX) 400 MG tablet Take 400 mg by mouth 3 times per day for 5 days to start immediately with each episode of fever blister. May also take one a day for prevention.    albuterol (VENTOLIN HFA) 108 (90 Base) MCG/ACT inhaler INHALE 2 PUFFS BY MOUTH EVERY 4 TO 6 HOURS AS NEEDED FOR SHORTNESS OF BREATH 10/24/2018: As needed   amLODipine (NORVASC) 5 MG tablet Take 1 tablet (5 mg total) daily by mouth. 10/26/2018: Pt taking differently: Takes 1 in the morning and 1/2 tablet at night.   apixaban (ELIQUIS) 5 MG TABS tablet Take 1 tablet (5 mg total) by mouth 2 (two) times daily.    ascorbic acid (VITAMIN C) 500 MG tablet Take 2,000 mg by mouth daily.     aspirin 81 MG tablet Take 1 tablet (81 mg total) by mouth daily.    buPROPion (WELLBUTRIN XL) 300 MG 24 hr tablet TAKE ONE TABLET BY MOUTH EVERY DAY    chlorthalidone (HYGROTON) 25 MG tablet TAKE 1/2 TABLET BY MOUTH DAILY.    Cholecalciferol (VITAMIN D) 2000 units tablet Take 2,000 Units by mouth daily.    Cyanocobalamin (B-12) 500 MCG TABS  Take 500 mcg by mouth daily.     diazepam (VALIUM) 5 MG tablet Take 1 tablet (5 mg total) by mouth every 12 (twelve) hours as needed for anxiety. Takes 1/2 tablet    ELDERBERRY PO Take by mouth daily. Unsure dose / combination vitamin    EPINEPHrine (EPIPEN 2-PAK) 0.3 mg/0.3 mL IJ SOAJ injection INJECT  AS DIRECTED FOR SEVERE ALLERGIC REACTION (Patient taking differently: Inject 0.3 mg into the muscle as needed (for severe allergic reaction).)    fluconazole (DIFLUCAN) 200 MG tablet Take 1 tablet on Monday and take 1 tablet on Thursday, repeat for 4 weeks    fluocinonide gel (LIDEX) 6.43 % Apply 1 application topically as needed.    isosorbide mononitrate (IMDUR) 30 MG 24 hr tablet Take 30 mg by mouth daily.    levothyroxine (SYNTHROID) 100 MCG tablet Take 1 tablet (100 mcg total) by mouth daily.    metoprolol tartrate (LOPRESSOR) 25 MG tablet One and one half tabs twice daily    mometasone (ELOCON) 0.1 % cream Apply 1 application topically daily as needed (Rash).    Multiple Vitamin (MULTI-VITAMINS) TABS Take 1 tablet by mouth daily.    nystatin (MYCOSTATIN) 100000 UNIT/ML suspension Take 100,000 Units by mouth 3 (three) times daily as needed. For mouth ulcers.    oxymetazoline (AFRIN) 0.05 % nasal spray Place 1 spray into both nostrils as needed for congestion. 10/24/2018: As needed   rosuvastatin (CRESTOR) 20 MG tablet TAKE ONE TABLET (20 MG) BY MOUTH EVERY EVENING    sertraline (ZOLOFT) 25 MG tablet TAKE 1 TABLET BY MOUTH DAILY    tacrolimus (PROTOPIC) 0.1 % ointment As needed for rosacea flare ups    zinc gluconate 50 MG tablet Take 50 mg by mouth daily.     zolpidem (AMBIEN) 5 MG tablet TAKE ONE TABLET BY MOUTH AT BEDTIME AS NEEDED FOR SLEEP    No facility-administered encounter medications on file as of 06/02/2021.   Care Gaps: COVID-19 Vaccine Zoster Vaccines Mammogram Influenza Vaccine  Star Rating Drugs: Rosuvastatin 20 mg last filled on 03/10/2021 for a 90-Day supply with [facility]   Please bring medications and supplements to appointment  Lynann Bologna, [place] Pharmacist Assistant Phone: [phone]

## 2021-06-02 NOTE — Telephone Encounter (Signed)
Patient returned call for lab results and notified:  Normal tsh. Continue current dose, can refill for 90 days

## 2021-06-03 ENCOUNTER — Telehealth: Payer: Medicare Other

## 2021-06-05 ENCOUNTER — Encounter: Payer: Self-pay | Admitting: Physician Assistant

## 2021-06-05 ENCOUNTER — Other Ambulatory Visit: Payer: Self-pay

## 2021-06-05 ENCOUNTER — Ambulatory Visit (INDEPENDENT_AMBULATORY_CARE_PROVIDER_SITE_OTHER): Payer: HMO | Admitting: Physician Assistant

## 2021-06-05 VITALS — BP 129/71 | HR 75 | Temp 98.1°F | Resp 12 | Wt 186.0 lb

## 2021-06-05 DIAGNOSIS — R300 Dysuria: Secondary | ICD-10-CM

## 2021-06-05 DIAGNOSIS — N3001 Acute cystitis with hematuria: Secondary | ICD-10-CM | POA: Diagnosis not present

## 2021-06-05 LAB — POCT URINALYSIS DIPSTICK
Bilirubin, UA: NEGATIVE
Glucose, UA: NEGATIVE
Ketones, UA: NEGATIVE
Nitrite, UA: NEGATIVE
Protein, UA: POSITIVE — AB
Spec Grav, UA: 1.02 (ref 1.010–1.025)
Urobilinogen, UA: 0.2 E.U./dL
pH, UA: 6 (ref 5.0–8.0)

## 2021-06-05 MED ORDER — CIPROFLOXACIN HCL 250 MG PO TABS: 250.0000 mg | ORAL_TABLET | Freq: Two times a day (BID) | ORAL | 0 refills | Status: AC

## 2021-06-05 NOTE — Patient Instructions (Addendum)
Based on your symptoms it is likely that your UTI did not fully resolve I am starting you on Ciprofloxacin 250 mg to be taken by mouth twice per day for three days  Please finish this medication in its entirety unless you have a reaction - if that happens please call EMS for serious allergic reaction , call us for less severe symptoms.  Please stay well hydrated and do not try to hold your urine for extended periods of time  Please go to the ER if you have any of the following: fever,chills, nausea and vomiting, confusion, hallucinations, allergic reactions to medications

## 2021-06-05 NOTE — Assessment & Plan Note (Signed)
Acute, not resolving after treatment at urgent care with Bactrim for 7 days  UA today shows signs of continued infection Culture ordered - previous culture demonstrated E.coli infection with susceptibility to Bactrim Ciprofloxacin 250 mg PO BID x 3 days ordered today as patient reports possible flank pain, back pain and would like to cover for potential complicated UTI at this time Culture results will continue to guide therapy ER precautions discussed with patient

## 2021-06-05 NOTE — Progress Notes (Signed)
Established patient visit   Patient: Stacey [name]   DOB: [birth year]   81 y.o. Female  MRN: [id number] Visit Date: 06/05/2021  Today's healthcare provider: Dani Gobble Clarke Peretz, PA-C  Introduced myself to the patient as a Journalist, newspaper and provided education on APPs in clinical practice.    Chief Complaint  Patient presents with   Urinary Tract Infection   Subjective    Urinary Tract Infection  This is a recurrent problem. Associated symptoms include flank pain, frequency and hematuria. Pertinent negatives include no chills, nausea or vomiting.   Patient was recently treated for UTI at [place] urgent [facility] on 05/26/2021. She was prescribed Bactrim DS x 7 days. She completed all doses of the medication but still has pain during urination and bleeding.   She reports continued dysuria and states the night before last she noticed blood in her urine She reports left lower back pain that feels different from her usual arthritis pain, heaviness/fullness in her lower abdomen region Reports sensation of incomplete voiding   Review of previous UA and culture demonstrates infection with E. Coli that was found to be susceptible to Bactrim      Medications: Outpatient Medications Prior to Visit  Medication Sig   acyclovir (ZOVIRAX) 400 MG tablet Take 400 mg by mouth 3 times per day for 5 days to start immediately with each episode of fever blister. May also take one a day for prevention.   albuterol (VENTOLIN HFA) 108 (90 Base) MCG/ACT inhaler INHALE 2 PUFFS BY MOUTH EVERY 4 TO 6 HOURS AS NEEDED FOR SHORTNESS OF BREATH   amLODipine (NORVASC) 5 MG tablet Take 1 tablet (5 mg total) daily by mouth.   apixaban (ELIQUIS) 5 MG TABS tablet Take 1 tablet (5 mg total) by mouth 2 (two) times daily.   ascorbic acid (VITAMIN C) 500 MG tablet Take 2,000 mg by mouth daily.    aspirin 81 MG tablet Take 1 tablet (81 mg total) by mouth daily.   buPROPion (WELLBUTRIN XL) 300 MG 24 hr tablet TAKE ONE TABLET BY  MOUTH EVERY DAY   chlorthalidone (HYGROTON) 25 MG tablet TAKE 1/2 TABLET BY MOUTH DAILY.   Cholecalciferol (VITAMIN D) 2000 units tablet Take 2,000 Units by mouth daily.   Cyanocobalamin (B-12) 500 MCG TABS Take 500 mcg by mouth daily.    diazepam (VALIUM) 5 MG tablet Take 1 tablet (5 mg total) by mouth every 12 (twelve) hours as needed for anxiety. Takes 1/2 tablet   ELDERBERRY PO Take by mouth daily. Unsure dose / combination vitamin   EPINEPHrine (EPIPEN 2-PAK) 0.3 mg/0.3 mL IJ SOAJ injection INJECT AS DIRECTED FOR SEVERE ALLERGIC REACTION (Patient taking differently: Inject 0.3 mg into the muscle as needed (for severe allergic reaction).)   fluconazole (DIFLUCAN) 200 MG tablet Take 1 tablet on Monday and take 1 tablet on Thursday, repeat for 4 weeks   fluocinonide gel (LIDEX) 1.69 % Apply 1 application topically as needed.   isosorbide mononitrate (IMDUR) 30 MG 24 hr tablet Take 30 mg by mouth daily.   levothyroxine (SYNTHROID) 100 MCG tablet Take 1 tablet (100 mcg total) by mouth daily.   metoprolol tartrate (LOPRESSOR) 25 MG tablet One and one half tabs twice daily   mometasone (ELOCON) 0.1 % cream Apply 1 application topically daily as needed (Rash).   Multiple Vitamin (MULTI-VITAMINS) TABS Take 1 tablet by mouth daily.   nystatin (MYCOSTATIN) 100000 UNIT/ML suspension Take 100,000 Units by mouth 3 (three) times daily as  needed. For mouth ulcers.   oxymetazoline (AFRIN) 0.05 % nasal spray Place 1 spray into both nostrils as needed for congestion.   rosuvastatin (CRESTOR) 20 MG tablet TAKE ONE TABLET (20 MG) BY MOUTH EVERY EVENING   sertraline (ZOLOFT) 25 MG tablet TAKE 1 TABLET BY MOUTH DAILY   tacrolimus (PROTOPIC) 0.1 % ointment As needed for rosacea flare ups   zinc gluconate 50 MG tablet Take 50 mg by mouth daily.    zolpidem (AMBIEN) 5 MG tablet TAKE ONE TABLET BY MOUTH AT BEDTIME AS NEEDED FOR SLEEP   No facility-administered medications prior to visit.    Review of Systems   Constitutional:  Negative for appetite change, chills, fatigue and fever.  Respiratory:  Negative for chest tightness and shortness of breath.   Cardiovascular:  Negative for chest pain and palpitations.  Gastrointestinal:  Positive for abdominal pain (lower abdomen). Negative for diarrhea, nausea and vomiting.  Genitourinary:  Positive for dysuria, enuresis, flank pain, frequency and hematuria.  Musculoskeletal:  Positive for back pain.  Neurological:  Negative for dizziness and weakness.  Psychiatric/Behavioral:  Negative for confusion and hallucinations.       Objective    BP 129/71 (BP Location: Right Arm, Patient Position: Sitting, Cuff Size: Normal)    Pulse 75    Temp 98.1 F (36.7 C) (Oral)    Resp 12    Wt 186 lb (84.4 kg)    SpO2 99% Comment: room air   BMI 35.14 kg/m  {Show previous vital signs (optional):23777}  Physical Exam Constitutional:      Appearance: Normal appearance. She is obese.  HENT:     Head: Normocephalic and atraumatic.  Eyes:     General: Lids are normal.     Extraocular Movements: Extraocular movements intact.     Conjunctiva/sclera: Conjunctivae normal.     Pupils: Pupils are equal, round, and reactive to light.  Cardiovascular:     Rate and Rhythm: Rhythm irregularly irregular.     Pulses: No decreased pulses.  Pulmonary:     Effort: Pulmonary effort is normal.     Breath sounds: Normal breath sounds.  Abdominal:     General: Abdomen is protuberant. Bowel sounds are normal.     Palpations: Abdomen is soft.     Tenderness: There is abdominal tenderness in the suprapubic area. There is no right CVA tenderness, left CVA tenderness or guarding.  Musculoskeletal:     Right lower leg: No edema.     Left lower leg: No edema.  Neurological:     Mental Status: She is alert.  Psychiatric:        Attention and Perception: Attention normal.        Mood and Affect: Mood and affect normal.        Speech: Speech normal.        Behavior: Behavior  normal. Behavior is cooperative.      Results for orders placed or performed in visit on 06/05/21  POCT Urinalysis Dipstick  Result Value Ref Range   Color, UA yellow    Clarity, UA cloudy    Glucose, UA Negative Negative   Bilirubin, UA Negative    Ketones, UA Negative    Spec Grav, UA 1.020 1.010 - 1.025   Blood, UA Moderate (Non-hemolyzed)    pH, UA 6.0 5.0 - 8.0   Protein, UA Positive (A) Negative   Urobilinogen, UA 0.2 0.2 or 1.0 E.U./dL   Nitrite, UA Negative    Leukocytes, UA Moderate (2+) (  A) Negative   Appearance     Odor      Assessment & Plan     Problem List Items Addressed This Visit       Genitourinary   Acute cystitis with hematuria - Primary    Acute, not resolving after treatment at urgent care with Bactrim for 7 days  UA today shows signs of continued infection Culture ordered - previous culture demonstrated E.coli infection with susceptibility to Bactrim Ciprofloxacin 250 mg PO BID x 3 days ordered today as patient reports possible flank pain, back pain and would like to cover for potential complicated UTI at this time Culture results will continue to guide therapy ER precautions discussed with patient       Relevant Medications   ciprofloxacin (CIPRO) 250 MG tablet   Other Relevant Orders   Urine Culture   Other Visit Diagnoses     Dysuria       Relevant Medications   ciprofloxacin (CIPRO) 250 MG tablet   Other Relevant Orders   POCT Urinalysis Dipstick (Completed)   Urine Culture        No follow-ups on file.   I, Azia Toutant E Keelan Pomerleau, PA-C, have reviewed all documentation for this visit. The documentation on 06/05/21 for the exam, diagnosis, procedures, and orders are all accurate and complete.   Deundra Bard, [place]ie [place] MPH [facility]   No follow-ups on file.         Almon Register, PA-C  [employer] [phone] (phone) [phone] (fax)  [place]

## 2021-06-10 LAB — URINE CULTURE

## 2021-06-12 ENCOUNTER — Telehealth: Payer: Self-pay

## 2021-06-12 NOTE — Telephone Encounter (Signed)
Copied from [place] [id number]. Topic: Appointment Scheduling - Scheduling Inquiry for Clinic >> Jun 12, 2021 10:15 AM Tessa Lerner A wrote: Reason for CRM: The patient's husband would like the patient seen today 06/12/21 for their urinary discomfort  The patient's husband shares that they are going on a cruise soon and would like the patient well before traveling  Please contact further

## 2021-06-13 ENCOUNTER — Encounter: Payer: Self-pay | Admitting: *Deleted

## 2021-06-13 ENCOUNTER — Ambulatory Visit: Payer: Self-pay | Admitting: *Deleted

## 2021-06-13 NOTE — Telephone Encounter (Signed)
°  Summary: Ecoli/ medication refill   Pts husband stated hsi wife needs a refill on her antibiotic / he stated she has Ecoli and they are suppose to be going on a cruise and she needs to get better and has been dealing with this for a couple weeks / he didn't know the name of the medication and states he called about this yesterday and never received a call back / please advise      Attempted to contact patient / husband to review request for additional antibiotics prior to a cruise. No answer , LMTCB. Last OV 06/05/21 for patient and prescribed ciproflaxin HCL 250 mg . Please advise if additional medication can be prescribed or if OV needed. Husband reports he called yesterday and awaiting call back .      Reason for Disposition  [1] Caller has NON-URGENT medicine question about med that PCP prescribed AND [2] triager unable to answer question  Answer Assessment - Initial Assessment Questions 1. DRUG NAME: "What medicine do you need to have refilled?"     Requesting additional refill of ciproflaxin HCL as ordered previously 06/05/21 OV 2. REFILLS REMAINING: "How many refills are remaining?" (Note: The label on the medicine or pill bottle will show how many refills are remaining. If there are no refills remaining, then a renewal may be needed.)     none 3. EXPIRATION DATE: "What is the expiration date?" (Note: The label states when the prescription will expire, and thus can no longer be refilled.)     na 4. PRESCRIBING HCP: "Who prescribed it?" Reason: If prescribed by specialist, call should be referred to that group.     E. Mecum, PA 5. SYMPTOMS: "Do you have any symptoms?"     Na 6. PREGNANCY: "Is there any chance that you are pregnant?" "When was your last menstrual period?"     na  Protocols used: Medication Refill and Renewal Call-A-AH

## 2021-06-13 NOTE — Telephone Encounter (Signed)
2nd attempt to contact patient / husband for further information or sx regarding E. Coli . No answer. Left message to call clinic back. Please see previous request for additional medication .

## 2021-06-13 NOTE — Telephone Encounter (Signed)
Patient called back and reviewed message form E. Mecum, PA regarding collection of another urine sample. Patient requesting if sample can be submitting Monday due to she is scheduled to go on a Cruise and leave Thursday 06/19/21. OV scheduled for 06/18/21 . Please advise if earlier OV can be scheduled and urine tested Monday instead of Wednesday  at [place]. Please advise. Recommended if patient has worsening symptoms go to UC / ED over the weekend.

## 2021-06-13 NOTE — Telephone Encounter (Signed)
This encounter was created in error - please disregard.

## 2021-06-13 NOTE — Telephone Encounter (Signed)
Pts husband called in to see if any medications will be sent in for the pt, please advise.

## 2021-06-14 ENCOUNTER — Other Ambulatory Visit: Payer: Self-pay | Admitting: Family Medicine

## 2021-06-14 ENCOUNTER — Ambulatory Visit
Admission: EM | Admit: 2021-06-14 | Discharge: 2021-06-14 | Disposition: A | Discharge status: Home / Self Care | Payer: HMO | Attending: Family Medicine | Admitting: Family Medicine

## 2021-06-14 ENCOUNTER — Encounter: Payer: Self-pay | Admitting: Emergency Medicine

## 2021-06-14 DIAGNOSIS — F411 Generalized anxiety disorder: Secondary | ICD-10-CM

## 2021-06-14 DIAGNOSIS — F3342 Major depressive disorder, recurrent, in full remission: Secondary | ICD-10-CM

## 2021-06-14 DIAGNOSIS — R8289 Other abnormal findings on cytological and histological examination of urine: Secondary | ICD-10-CM

## 2021-06-14 DIAGNOSIS — N3001 Acute cystitis with hematuria: Secondary | ICD-10-CM | POA: Insufficient documentation

## 2021-06-14 LAB — POCT URINALYSIS DIP (MANUAL ENTRY)
Bilirubin, UA: NEGATIVE
Blood, UA: NEGATIVE
Glucose, UA: NEGATIVE mg/dL
Ketones, POC UA: NEGATIVE mg/dL
Nitrite, UA: POSITIVE — AB
Spec Grav, UA: 1.02 (ref 1.010–1.025)
Urobilinogen, UA: 0.2 E.U./dL
pH, UA: 7 (ref 5.0–8.0)

## 2021-06-14 MED ORDER — CIPROFLOXACIN HCL 500 MG PO TABS: 500.0000 mg | ORAL_TABLET | Freq: Every day | ORAL | 0 refills | Status: AC

## 2021-06-14 NOTE — Telephone Encounter (Signed)
Requested Prescriptions  Pending Prescriptions Disp Refills   buPROPion (WELLBUTRIN XL) 300 MG 24 hr tablet [Pharmacy Med Name: BUPROPION HCL ER (XL) 300 MG TAB] 90 tablet 1    Sig: TAKE ONE TABLET BY MOUTH EVERY DAY     Psychiatry: Antidepressants - bupropion Failed - 06/14/2021  8:59 AM      Failed - Completed PHQ-2 or PHQ-9 in the last 360 days      Passed - Last BP in normal range    BP Readings from Last 1 Encounters:  06/05/21 129/71         Passed - Valid encounter within last 6 months    Recent Outpatient Visits          1 week ago Acute cystitis with hematuria   [employer], Dani Gobble, PA-C   1 month ago Essential hypertension   [employer], [place], PA-C   9 months ago Cellulitis of right lower extremity   [place], [place]   10 months ago Cellulitis of right lower extremity   [facility] [name], MD   1 year ago Essential hypertension   [facility] [name], MD      Future Appointments            In 4 days [place], Jake Church, DO [facility], [place]   In 7 months Ralene Bathe, MD [place]            sertraline (ZOLOFT) 25 MG tablet [Pharmacy Med Name: SERTRALINE HCL 25 MG TAB] 90 tablet 1    Sig: TAKE 1 TABLET BY MOUTH DAILY. SCHEDULE APPT FOR MORE REFILLS     Psychiatry:  Antidepressants - SSRI Failed - 06/14/2021  8:59 AM      Failed - Completed PHQ-2 or PHQ-9 in the last 360 days      Passed - Valid encounter within last 6 months    Recent Outpatient Visits          1 week ago Acute cystitis with hematuria   [place], Dani Gobble, PA-C   1 month ago Essential hypertension   [employer], [place], PA-C   9 months ago Cellulitis of right lower extremity   [place], [place]   10 months ago Cellulitis of right lower  extremity   [facility] [name], MD   1 year ago Essential hypertension   [facility] [name], MD      Future Appointments            In 4 days [place], Jake Church, DO [facility], [place]   In 7 months Ralene Bathe, MD [place]

## 2021-06-14 NOTE — ED Triage Notes (Signed)
Pt presents with dysuria and bladder pressure for several weeks.

## 2021-06-14 NOTE — Discharge Instructions (Signed)
I will send your urine out for culture. The urine culture usually results within 3-4 days. If any additional treatment is needed we will notify you by phone.

## 2021-06-14 NOTE — ED Provider Notes (Addendum)
Roderic Palau    CSN: [id number] Arrival date & time: 06/14/21  1125      History   Chief Complaint Chief Complaint  Patient presents with   Dysuria    HPI Stacey [name] is a 81 y.o. female.   HPI Patient presents today for evaluation of urine. Patient has been recently treated with two different antibiotics (Bactrim and Cipro) for acute urinary infection. Completed last dose of CIPRO Monday, continue to experience dysuria and frequency. Denies fever, vomiting, hematuria, although endorses nausea. She has taken AZO for the last few days to manage dysuria.  Past Medical History:  Diagnosis Date   Abnormal nuclear stress test 05/24/2017   Arthritis    Asthma    Atrial fibrillation, transient (HCC)    Benign neoplasm of ascending colon    Benign neoplasm of descending colon    CAD (coronary artery disease)    a. s/p CABG x 3: VG->dRCA, VG->D1, LIMA->LAD   Cancer (HCC)    Complication of anesthesia    COVID-19    Depression    Family history of adverse reaction to anesthesia    most of family - PONV   Fibromyalgia    Glaucoma    Headache    migraines - 1-2x/mo   HTN (hypertension) 03/09/2007   Hyperlipidemia LDL goal <70 03/25/1993   Hypertension    Motion sickness    all moving vehicles   PONV (postoperative nausea and vomiting)    Squamous cell carcinoma of skin 07/25/2019   Left upper lateral lip above vermillion border. WD SCC with superficial infiltration., MOHs 10/02/19   Stroke ([place]) 2019   Thyroid disease     Patient Active Problem List   Diagnosis Date Noted   Acute cystitis with hematuria 06/05/2021   OSA (obstructive sleep apnea) 02/06/2020   Lymphedema 01/19/2020   Cellulitis of right lower extremity 11/30/2019   Personal history of other malignant neoplasm of skin 10/02/2019   Chest pain 10/26/2018   TIA (transient ischemic attack) 03/21/2018   CVA (cerebral vascular accident) ([place]) 10/22/2017   Sciatic pain, right 07/02/2017    Abnormal nuclear stress test 05/24/2017   Coronary artery disease involving coronary bypass graft of native heart 03/15/2017   PAF (paroxysmal atrial fibrillation) (HCC)    Non-STEMI (non-ST elevated myocardial infarction) ([place]) 02/20/2017   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   Moderate mitral insufficiency 01/11/2017   Personal history of colonic polyps    Disequilibrium 09/16/2015   Panic attacks 09/11/2015   Ganglion cyst 08/16/2015   Hypernatremia 07/04/2015   Other osteoarthritis involving multiple joints 02/06/2015   Clinical depression 09/27/2014   Dry mouth 09/27/2014   Fibrositis 09/27/2014   LBP (low back pain) 09/27/2014   Avitaminosis D 09/27/2014   Decreased leukocytes 09/27/2014   Abnormal blood sugar 05/03/2009   Insomnia 09/29/2007   Essential hypertension 03/09/2007   Adaptation reaction 01/29/2007   Acid reflux 10/07/2006   Menopausal and postmenopausal disorder 10/05/2006   Headache, migraine 10/05/2006   Arthritis, degenerative 03/23/1994   Adult hypothyroidism 04/15/1993   Hyperlipidemia LDL goal <70 03/25/1993    Past Surgical History:  Procedure Laterality Date   ABDOMINAL HYSTERECTOMY     APPENDECTOMY     BLADDER SURGERY     CATARACT EXTRACTION W/ INTRAOCULAR LENS  IMPLANT, BILATERAL     COLONOSCOPY WITH PROPOFOL N/A 01/17/2016   Procedure: COLONOSCOPY WITH PROPOFOL;  Surgeon: [name], MD;  Location: [facility];  Service: Endoscopy;  Laterality: N/A;  CORONARY ARTERY BYPASS GRAFT N/A 02/20/2017   Procedure: CORONARY ARTERY BYPASS GRAFTING (CABG) x , three using left internal mammary artery to left anterior descending coronary artery and right greater saphenous vein harvested endoscopically to distal right and diagonal coronary arteries.;  Surgeon: Grace Isaac, MD;  Location: [facility];  Service: Open Heart Surgery;  Laterality: N/A;   LEFT HEART CATH AND CORONARY ANGIOGRAPHY N/A 02/20/2017   Procedure: LEFT HEART CATH AND CORONARY ANGIOGRAPHY;   Surgeon: Isaias Cowman, MD;  Location: [facility];  Service: Cardiovascular;  Laterality: N/A;   LEFT HEART CATH AND CORS/GRAFTS ANGIOGRAPHY N/A 05/24/2017   Procedure: LEFT HEART CATH AND CORS/GRAFTS ANGIOGRAPHY;  Surgeon: Sherren Mocha, MD;  Location: [facility];  Service: Cardiovascular;  Laterality: N/A;   NASAL SINUS SURGERY     POLYPECTOMY  01/17/2016   Procedure: POLYPECTOMY;  Surgeon: [name], MD;  Location: [facility];  Service: Endoscopy;;   RIGHT OOPHORECTOMY     TEE WITHOUT CARDIOVERSION N/A 02/20/2017   Procedure: TRANSESOPHAGEAL ECHOCARDIOGRAM (TEE);  Surgeon: Grace Isaac, MD;  Location: [facility];  Service: Open Heart Surgery;  Laterality: N/A;    OB History     Gravida  1   Para  1   Term      Preterm      AB      Living         SAB      IAB      Ectopic      Multiple      Live Births               Home Medications    Prior to Admission medications   Medication Sig Start Date End Date Taking? Authorizing Provider  ciprofloxacin (CIPRO) 500 MG tablet Take 1 tablet (500 mg total) by mouth daily at 12 noon for 4 days. 06/14/21 06/18/21 Yes Scot Jun, FNP  acyclovir (ZOVIRAX) 400 MG tablet Take 400 mg by mouth 3 times per day for 5 days to start immediately with each episode of fever blister. May also take one a day for prevention. 01/09/21   Ralene Bathe, MD  albuterol (VENTOLIN HFA) 108 (90 Base) MCG/ACT inhaler INHALE 2 PUFFS BY MOUTH EVERY 4 TO 6 HOURS AS NEEDED FOR SHORTNESS OF BREATH 08/15/18   [name], MD  amLODipine (NORVASC) 5 MG tablet Take 1 tablet (5 mg total) daily by mouth. 04/04/17   [name], MD  apixaban (ELIQUIS) 5 MG TABS tablet Take 1 tablet (5 mg total) by mouth 2 (two) times daily. 10/24/17   Gladstone Lighter, MD  ascorbic acid (VITAMIN C) 500 MG tablet Take 2,000 mg by mouth daily.     [provider]  aspirin 81 MG tablet Take 1 tablet (81  mg total) by mouth daily. 10/24/17   Gladstone Lighter, MD  buPROPion (WELLBUTRIN XL) 300 MG 24 hr tablet TAKE ONE TABLET BY MOUTH EVERY DAY 06/14/21   [name], MD  chlorthalidone (HYGROTON) 25 MG tablet TAKE 1/2 TABLET BY MOUTH DAILY. 06/22/20   [name], MD  Cholecalciferol (VITAMIN D) 2000 units tablet Take 2,000 Units by mouth daily.    [provider]  Cyanocobalamin (B-12) 500 MCG TABS Take 500 mcg by mouth daily.     [provider]  diazepam (VALIUM) 5 MG tablet Take 1 tablet (5 mg total) by mouth every 12 (twelve) hours as needed  for anxiety. Takes 1/2 tablet 02/10/21   [name], MD  ELDERBERRY PO Take by mouth daily. Unsure dose / combination vitamin    [provider]  EPINEPHrine (EPIPEN 2-PAK) 0.3 mg/0.3 mL IJ SOAJ injection INJECT AS DIRECTED FOR SEVERE ALLERGIC REACTION Patient taking differently: Inject 0.3 mg into the muscle as needed (for severe allergic reaction). 12/17/16   Mar Daring, PA-C  fluconazole (DIFLUCAN) 200 MG tablet Take 1 tablet on Monday and take 1 tablet on Thursday, repeat for 4 weeks 03/17/21   Ralene Bathe, MD  fluocinonide gel (LIDEX) 0.62 % Apply 1 application topically as needed. 10/22/18   [provider]  isosorbide mononitrate (IMDUR) 30 MG 24 hr tablet Take 30 mg by mouth daily. 06/01/20   [provider]  levothyroxine (SYNTHROID) 100 MCG tablet Take 1 tablet (100 mcg total) by mouth daily. 06/02/21   Mikey Kirschner, PA-C  metoprolol tartrate (LOPRESSOR) 25 MG tablet One and one half tabs twice daily 03/26/21   [name], MD  mometasone (ELOCON) 0.1 % cream Apply 1 application topically daily as needed (Rash). 02/17/21   Ralene Bathe, MD  Multiple Vitamin (MULTI-VITAMINS) TABS Take 1 tablet by mouth daily.    [provider]  nystatin (MYCOSTATIN) 100000 UNIT/ML suspension Take 100,000 Units by mouth 3 (three) times daily as needed. For  mouth ulcers. 10/22/18   [provider]  oxymetazoline (AFRIN) 0.05 % nasal spray Place 1 spray into both nostrils as needed for congestion.    [provider]  rosuvastatin (CRESTOR) 20 MG tablet TAKE ONE TABLET (20 MG) BY MOUTH EVERY EVENING 03/10/21   [name], MD  sertraline (ZOLOFT) 25 MG tablet TAKE 1 TABLET BY MOUTH DAILY. SCHEDULE APPT FOR MORE REFILLS 06/14/21   [name], MD  tacrolimus (PROTOPIC) 0.1 % ointment As needed for rosacea flare ups 11/24/18   [provider]  zinc gluconate 50 MG tablet Take 50 mg by mouth daily.     [provider]  zolpidem (AMBIEN) 5 MG tablet TAKE ONE TABLET BY MOUTH AT BEDTIME AS NEEDED FOR SLEEP 05/08/21   Mikey Kirschner, PA-C    Family History Family History  Problem Relation Age of Onset   Alzheimer's disease Mother    Kidney cancer Mother    Heart attack Father    Pancreatic cancer Sister    Heart attack Brother 44   Ovarian cancer Sister    Kidney disease Sister        dialysis   Cancer Sister        uterin   Diabetes Sister    Heart attack Sister    Heart attack Brother 48   Ovarian cancer Sister     Social History Social History   Tobacco Use   Smoking status: Former    Types: Cigarettes    Quit date: 05/26/1975    Years since quitting: 46.0   Smokeless tobacco: Never   Tobacco comments:    quit 45  years ago   Vaping Use   Vaping Use: Never used  Substance Use Topics   Alcohol use: No   Drug use: No     Allergies   Cephalexin, Atorvastatin, Morphine and related, Other, Procaine, Shellfish allergy, Statins, Tomato, and Penicillins   Review of Systems Review of Systems Pertinent negatives listed in HPI  Physical Exam Triage Vital Signs ED Triage Vitals [06/14/21 1321]  Enc Vitals Group  BP (!) 148/89     Pulse Rate 77     Resp 16     Temp 98.1 F (36.7 C)     Temp Source Oral     SpO2 96 %     Weight      Height      Head Circumference       Peak Flow      Pain Score      Pain Loc      Pain Edu?      Excl. in [place]?    No data found.  Updated Vital Signs BP (!) 148/89 (BP Location: Left Arm)    Pulse 77    Temp 98.1 F (36.7 C) (Oral)    Resp 16    SpO2 96%   Visual Acuity Right Eye Distance:   Left Eye Distance:   Bilateral Distance:    Right Eye Near:   Left Eye Near:    Bilateral Near:     Physical Exam Constitutional:      Appearance: Normal appearance. She is not ill-appearing.  HENT:     Head: Normocephalic and atraumatic.  Eyes:     Extraocular Movements: Extraocular movements intact.     Pupils: Pupils are equal, round, and reactive to light.  Cardiovascular:     Rate and Rhythm: Normal rate and regular rhythm.  Pulmonary:     Effort: Pulmonary effort is normal.     Breath sounds: Normal breath sounds.  Abdominal:     Tenderness: There is no abdominal tenderness. There is no right CVA tenderness, left CVA tenderness or guarding.  Musculoskeletal:     Cervical back: Normal range of motion and neck supple.  Skin:    General: Skin is warm and dry.     Capillary Refill: Capillary refill takes less than 2 seconds.  Neurological:     General: No focal deficit present.     Mental Status: She is alert and oriented to person, place, and time.  Psychiatric:        Mood and Affect: Mood normal.        Behavior: Behavior normal.        Thought Content: Thought content normal.    UC Treatments / Results  Labs (all labs ordered are listed, but only abnormal results are displayed) Labs Reviewed  POCT URINALYSIS DIP (MANUAL ENTRY) - Abnormal; Notable for the following components:      Result Value   Color, UA orange (*)    Protein Ur, POC trace (*)    Nitrite, UA Positive (*)    Leukocytes, UA Small (1+) (*)    All other components within normal limits  URINE CULTURE    EKG   Radiology No results found.  Procedures Procedures (including critical care time)  Medications Ordered in  UC Medications - No data to display  Initial Impression / Assessment and Plan / UC Course  I have reviewed the triage vital signs and the nursing notes.  Pertinent labs & imaging results that were available during my care of the patient were reviewed by me and considered in my medical decision making (see chart for details).    Acute cystitis and recent abnormal cytology, urine culture positive for E-Coli. Will repeat Cipro for additional 4 days while awaiting urine culture, UA results inaccurate as patient has been taking AZO. Will notify patient of any abnormal lab results. RTC PRN Final Clinical Impressions(s) / UC Diagnoses   Final diagnoses:  Abnormal urine  cytology  Acute cystitis with hematuria     Discharge Instructions      I will send your urine out for culture. The urine culture usually results within 3-4 days. If any additional treatment is needed we will notify you by phone.    ED Prescriptions     Medication Sig Dispense Auth. Provider   ciprofloxacin (CIPRO) 500 MG tablet Take 1 tablet (500 mg total) by mouth daily at 12 noon for 4 days. 4 tablet Scot Jun, FNP      PDMP not reviewed this encounter.   Scot Jun, FNP 06/14/21 [place], [place], [place] 06/14/21 [phone]

## 2021-06-16 ENCOUNTER — Telehealth: Payer: Self-pay

## 2021-06-16 LAB — URINE CULTURE
Culture: 50000 — AB
Special Requests: NORMAL

## 2021-06-16 NOTE — Telephone Encounter (Signed)
Copied from [place] [id number]. Topic: General - Other >> Jun 16, 2021 10:51 AM Alanda Slim E wrote: Reason for CRM:  Caryl Pina from [facility] advantage called to verify if this pt has chronic heart failure or diabetes / please advise

## 2021-06-16 NOTE — Telephone Encounter (Signed)
Please Review and advise. Thanks  

## 2021-06-17 ENCOUNTER — Telehealth (HOSPITAL_COMMUNITY): Payer: Self-pay | Admitting: Emergency Medicine

## 2021-06-17 ENCOUNTER — Encounter: Payer: Self-pay | Admitting: Family Medicine

## 2021-06-17 MED ORDER — FOSFOMYCIN TROMETHAMINE 3 G PO PACK: 3.0000 g | PACK | Freq: Once | ORAL | 0 refills | Status: AC

## 2021-06-17 NOTE — Telephone Encounter (Signed)
Information was given. 

## 2021-06-18 ENCOUNTER — Ambulatory Visit: Payer: HMO | Admitting: Family Medicine

## 2021-06-18 ENCOUNTER — Other Ambulatory Visit: Payer: Self-pay

## 2021-06-18 ENCOUNTER — Encounter: Payer: Self-pay | Admitting: Emergency Medicine

## 2021-06-18 ENCOUNTER — Ambulatory Visit
Admission: EM | Admit: 2021-06-18 | Discharge: 2021-06-18 | Disposition: A | Discharge status: Home / Self Care | Payer: HMO | Attending: Urgent Care | Admitting: Urgent Care

## 2021-06-18 DIAGNOSIS — N3000 Acute cystitis without hematuria: Secondary | ICD-10-CM | POA: Diagnosis not present

## 2021-06-18 DIAGNOSIS — R102 Pelvic and perineal pain: Secondary | ICD-10-CM | POA: Diagnosis not present

## 2021-06-18 MED ORDER — NITROFURANTOIN MONOHYD MACRO 100 MG PO CAPS: 100.0000 mg | ORAL_CAPSULE | Freq: Two times a day (BID) | ORAL | 0 refills | Status: DC

## 2021-06-18 NOTE — ED Provider Notes (Signed)
Stacey [name]   MRN: [id number] DOB: 1940/09/22  Subjective:   Stacey [name] is a 81 y.o. female presenting for persistent urinary pressure, pelvic pressure, low back pain.  This is the patient's fourth visit for the same.  Patient was initially seen 05/26/2021, was started on Bactrim for acute cystitis.  She was subsequently seen again on 06/05/2021 and still had acute cystitis and therefore was prescribed ciprofloxacin.  Urine culture did not show resistance to Cipro from that visit.  She was seen again 06/14/2021 and still showed signs of acute cystitis.  She was prescribed ciprofloxacin again but this time there was resistance.  Subsequently, our clinic called in the prescription for fosfomycin 3 g.  The patient took this yesterday but unfortunately her symptoms persist.  Urine culture shows multidrug resistance from the previous urine cultures but has been different each time.  No history of CKD.  She has not seen a and urologist.  No current facility-administered medications for this encounter.  Current Outpatient Medications:    acyclovir (ZOVIRAX) 400 MG tablet, Take 400 mg by mouth 3 times per day for 5 days to start immediately with each episode of fever blister. May also take one a day for prevention., Disp: 30 tablet, Rfl: 11   albuterol (VENTOLIN HFA) 108 (90 Base) MCG/ACT inhaler, INHALE 2 PUFFS BY MOUTH EVERY 4 TO 6 HOURS AS NEEDED FOR SHORTNESS OF BREATH, Disp: 18 Inhaler, Rfl: 5   amLODipine (NORVASC) 5 MG tablet, Take 1 tablet (5 mg total) daily by mouth., Disp: 90 tablet, Rfl: 3   apixaban (ELIQUIS) 5 MG TABS tablet, Take 1 tablet (5 mg total) by mouth 2 (two) times daily., Disp: 60 tablet, Rfl: 2   ascorbic acid (VITAMIN C) 500 MG tablet, Take 2,000 mg by mouth daily. , Disp: , Rfl:    aspirin 81 MG tablet, Take 1 tablet (81 mg total) by mouth daily., Disp: 30 tablet, Rfl: 2   buPROPion (WELLBUTRIN XL) 300 MG 24 hr tablet, TAKE ONE TABLET BY MOUTH EVERY DAY,  Disp: 90 tablet, Rfl: 1   chlorthalidone (HYGROTON) 25 MG tablet, TAKE 1/2 TABLET BY MOUTH DAILY., Disp: 90 tablet, Rfl: 1   Cholecalciferol (VITAMIN D) 2000 units tablet, Take 2,000 Units by mouth daily., Disp: , Rfl:    ciprofloxacin (CIPRO) 500 MG tablet, Take 1 tablet (500 mg total) by mouth daily at 12 noon for 4 days., Disp: 4 tablet, Rfl: 0   Cyanocobalamin (B-12) 500 MCG TABS, Take 500 mcg by mouth daily. , Disp: , Rfl:    diazepam (VALIUM) 5 MG tablet, Take 1 tablet (5 mg total) by mouth every 12 (twelve) hours as needed for anxiety. Takes 1/2 tablet, Disp: 30 tablet, Rfl: 1   ELDERBERRY PO, Take by mouth daily. Unsure dose / combination vitamin, Disp: , Rfl:    EPINEPHrine (EPIPEN 2-PAK) 0.3 mg/0.3 mL IJ SOAJ injection, INJECT AS DIRECTED FOR SEVERE ALLERGIC REACTION (Patient taking differently: Inject 0.3 mg into the muscle as needed (for severe allergic reaction).), Disp: 1 Device, Rfl: 1   fluconazole (DIFLUCAN) 200 MG tablet, Take 1 tablet on Monday and take 1 tablet on Thursday, repeat for 4 weeks, Disp: 8 tablet, Rfl: 0   fluocinonide gel (LIDEX) 0.76 %, Apply 1 application topically as needed., Disp: , Rfl:    isosorbide mononitrate (IMDUR) 30 MG 24 hr tablet, Take 30 mg by mouth daily., Disp: , Rfl:    levothyroxine (SYNTHROID) 100 MCG tablet, Take 1 tablet (100 mcg total)  by mouth daily., Disp: 90 tablet, Rfl: 0   metoprolol tartrate (LOPRESSOR) 25 MG tablet, One and one half tabs twice daily, Disp: 90 tablet, Rfl: 0   mometasone (ELOCON) 0.1 % cream, Apply 1 application topically daily as needed (Rash)., Disp: 45 g, Rfl: 2   Multiple Vitamin (MULTI-VITAMINS) TABS, Take 1 tablet by mouth daily., Disp: , Rfl:    nystatin (MYCOSTATIN) 100000 UNIT/ML suspension, Take 100,000 Units by mouth 3 (three) times daily as needed. For mouth ulcers., Disp: , Rfl:    oxymetazoline (AFRIN) 0.05 % nasal spray, Place 1 spray into both nostrils as needed for congestion., Disp: , Rfl:     rosuvastatin (CRESTOR) 20 MG tablet, TAKE ONE TABLET (20 MG) BY MOUTH EVERY EVENING, Disp: 90 tablet, Rfl: 3   sertraline (ZOLOFT) 25 MG tablet, TAKE 1 TABLET BY MOUTH DAILY. SCHEDULE APPT FOR MORE REFILLS, Disp: 90 tablet, Rfl: 1   tacrolimus (PROTOPIC) 0.1 % ointment, As needed for rosacea flare ups, Disp: , Rfl:    zinc gluconate 50 MG tablet, Take 50 mg by mouth daily. , Disp: , Rfl:    zolpidem (AMBIEN) 5 MG tablet, TAKE ONE TABLET BY MOUTH AT BEDTIME AS NEEDED FOR SLEEP, Disp: 30 tablet, Rfl: 0   Allergies  Allergen Reactions   Cephalexin Anaphylaxis   Atorvastatin    Morphine And Related Hives   Other Hives and Other (See Comments)    Muscle pain   Procaine Other (See Comments)    Chest pain Chest pain   Shellfish Allergy Swelling    angioedema   Statins Other (See Comments)    Muscle pain   Tomato Other (See Comments)    (by testing) - also beans, wheat, peas (by testing) - also beans, wheat, peas   Penicillins Hives and Rash    Has patient had a PCN reaction causing immediate rash, facial/tongue/throat swelling, SOB or lightheadedness with hypotension: Yes Has patient had a PCN reaction causing severe rash involving mucus membranes or skin necrosis: No Has patient had a PCN reaction that required hospitalization: No Has patient had a PCN reaction occurring within the last 10 years: Yes If all of the above answers are "NO", then may proceed with Cephalosporin use.    Past Medical History:  Diagnosis Date   Abnormal nuclear stress test 05/24/2017   Arthritis    Asthma    Atrial fibrillation, transient (HCC)    Benign neoplasm of ascending colon    Benign neoplasm of descending colon    CAD (coronary artery disease)    a. s/p CABG x 3: VG->dRCA, VG->D1, LIMA->LAD   Cancer (HCC)    Complication of anesthesia    COVID-19    Depression    Family history of adverse reaction to anesthesia    most of family - PONV   Fibromyalgia    Glaucoma    Headache    migraines -  1-2x/mo   HTN (hypertension) 03/09/2007   Hyperlipidemia LDL goal <70 03/25/1993   Hypertension    Motion sickness    all moving vehicles   PONV (postoperative nausea and vomiting)    Squamous cell carcinoma of skin 07/25/2019   Left upper lateral lip above vermillion border. WD SCC with superficial infiltration., MOHs 10/02/19   Stroke ([place]) 2019   Thyroid disease      Past Surgical History:  Procedure Laterality Date   ABDOMINAL HYSTERECTOMY     APPENDECTOMY     BLADDER SURGERY     CATARACT EXTRACTION  W/ INTRAOCULAR LENS  IMPLANT, BILATERAL     COLONOSCOPY WITH PROPOFOL N/A 01/17/2016   Procedure: COLONOSCOPY WITH PROPOFOL;  Surgeon: [name], MD;  Location: [facility];  Service: Endoscopy;  Laterality: N/A;   CORONARY ARTERY BYPASS GRAFT N/A 02/20/2017   Procedure: CORONARY ARTERY BYPASS GRAFTING (CABG) x , three using left internal mammary artery to left anterior descending coronary artery and right greater saphenous vein harvested endoscopically to distal right and diagonal coronary arteries.;  Surgeon: Grace Isaac, MD;  Location: [facility];  Service: Open Heart Surgery;  Laterality: N/A;   LEFT HEART CATH AND CORONARY ANGIOGRAPHY N/A 02/20/2017   Procedure: LEFT HEART CATH AND CORONARY ANGIOGRAPHY;  Surgeon: Isaias Cowman, MD;  Location: [facility];  Service: Cardiovascular;  Laterality: N/A;   LEFT HEART CATH AND CORS/GRAFTS ANGIOGRAPHY N/A 05/24/2017   Procedure: LEFT HEART CATH AND CORS/GRAFTS ANGIOGRAPHY;  Surgeon: Sherren Mocha, MD;  Location: [facility];  Service: Cardiovascular;  Laterality: N/A;   NASAL SINUS SURGERY     POLYPECTOMY  01/17/2016   Procedure: POLYPECTOMY;  Surgeon: [name], MD;  Location: [facility];  Service: Endoscopy;;   RIGHT OOPHORECTOMY     TEE WITHOUT CARDIOVERSION N/A 02/20/2017   Procedure: TRANSESOPHAGEAL ECHOCARDIOGRAM (TEE);  Surgeon: Grace Isaac, MD;  Location: [facility];  Service: Open Heart  Surgery;  Laterality: N/A;    Family History  Problem Relation Age of Onset   Alzheimer's disease Mother    Kidney cancer Mother    Heart attack Father    Pancreatic cancer Sister    Heart attack Brother 30   Ovarian cancer Sister    Kidney disease Sister        dialysis   Cancer Sister        uterin   Diabetes Sister    Heart attack Sister    Heart attack Brother 7   Ovarian cancer Sister     Social History   Tobacco Use   Smoking status: Former    Types: Cigarettes    Quit date: 05/26/1975    Years since quitting: 46.0   Smokeless tobacco: Never   Tobacco comments:    quit 45  years ago   Vaping Use   Vaping Use: Never used  Substance Use Topics   Alcohol use: No   Drug use: No    ROS   Objective:   Vitals: BP (!) 159/98 (BP Location: Left Arm)    Pulse 80    Temp 98.6 F (37 C) (Oral)    Resp 18    SpO2 97%   Physical Exam Constitutional:      General: She is not in acute distress.    Appearance: Normal appearance. She is well-developed. She is not ill-appearing, toxic-appearing or diaphoretic.  HENT:     Head: Normocephalic and atraumatic.     Nose: Nose normal.     Mouth/Throat:     Mouth: Mucous membranes are moist.     Pharynx: Oropharynx is clear.  Eyes:     General: No scleral icterus.       Right eye: No discharge.        Left eye: No discharge.     Extraocular Movements: Extraocular movements intact.     Conjunctiva/sclera: Conjunctivae normal.  Cardiovascular:     Rate and Rhythm: Normal rate.  Pulmonary:     Effort: Pulmonary effort is normal.  Abdominal:     General: Bowel sounds are normal. There  is no distension.     Palpations: Abdomen is soft. There is no mass.     Tenderness: There is abdominal tenderness (suprapubic, lower abdomen). There is no right CVA tenderness, left CVA tenderness, guarding or rebound.  Skin:    General: Skin is warm and dry.  Neurological:     General: No focal deficit present.     Mental Status: She  is alert and oriented to person, place, and time.  Psychiatric:        Mood and Affect: Mood normal.        Behavior: Behavior normal.        Thought Content: Thought content normal.        Judgment: Judgment normal.   Recent Results (from the past 2160 hour(s))  Comprehensive Metabolic Panel (CMET)     Status: Abnormal   Collection Time: 05/02/21  2:00 PM  Result Value Ref Range   Glucose 108 (H) 70 - 99 mg/dL   BUN 30 (H) 8 - 27 mg/dL   Creatinine, Ser 1.09 (H) 0.57 - 1.00 mg/dL   eGFR 52 (L) >59 mL/min/1.73   BUN/Creatinine Ratio 28 12 - 28   Sodium 136 134 - 144 mmol/L   Potassium 3.7 3.5 - 5.2 mmol/L   Chloride 94 (L) 96 - 106 mmol/L   CO2 29 20 - 29 mmol/L   Calcium 10.0 8.7 - 10.3 mg/dL   Total Protein 6.7 6.0 - 8.5 g/dL   Albumin 4.7 3.7 - 4.7 g/dL   Globulin, Total 2.0 1.5 - 4.5 g/dL   Albumin/Globulin Ratio 2.4 (H) 1.2 - 2.2   Bilirubin Total 0.5 0.0 - 1.2 mg/dL   Alkaline Phosphatase 79 44 - 121 IU/L   AST 26 0 - 40 IU/L   ALT 20 0 - 32 IU/L  Lipid Profile     Status: None   Collection Time: 05/02/21  2:00 PM  Result Value Ref Range   Cholesterol, Total 151 100 - 199 mg/dL   Triglycerides 147 0 - 149 mg/dL   HDL 55 >39 mg/dL   VLDL Cholesterol Cal 25 5 - 40 mg/dL   LDL Chol Calc (NIH) 71 0 - 99 mg/dL   Chol/HDL Ratio 2.7 0.0 - 4.4 ratio    Comment:                                   T. Chol/HDL Ratio                                             Men  Women                               1/2 Avg.Risk  3.4    3.3                                   Avg.Risk  5.0    4.4                                2X Avg.Risk  9.6    7.1  3X Avg.Risk 23.4   11.0   HgB A1c     Status: Abnormal   Collection Time: 05/02/21  2:00 PM  Result Value Ref Range   Hgb A1c MFr Bld 5.9 (H) 4.8 - 5.6 %    Comment:          Prediabetes: 5.7 - 6.4          Diabetes: >6.4          Glycemic control for adults with diabetes: <7.0    Est. average glucose Bld gHb  Est-mCnc 123 mg/dL  TSH     Status: None   Collection Time: 05/30/21  2:26 PM  Result Value Ref Range   TSH 2.560 0.450 - 4.500 uIU/mL  POCT Urinalysis Dipstick     Status: Abnormal   Collection Time: 06/05/21  1:38 PM  Result Value Ref Range   Color, UA yellow    Clarity, UA cloudy    Glucose, UA Negative Negative   Bilirubin, UA Negative    Ketones, UA Negative    Spec Grav, UA 1.020 1.010 - 1.025   Blood, UA Moderate (Non-hemolyzed)    pH, UA 6.0 5.0 - 8.0   Protein, UA Positive (A) Negative   Urobilinogen, UA 0.2 0.2 or 1.0 E.U./dL   Nitrite, UA Negative    Leukocytes, UA Moderate (2+) (A) Negative   Appearance     Odor    Urine Culture     Status: Abnormal   Collection Time: 06/05/21  1:39 PM   Specimen: Urine   Urine  Result Value Ref Range   Urine Culture, Routine Final report (A)    Organism ID, Bacteria Escherichia coli (A)     Comment: Cefazolin <=4 ug/mL Cefazolin with an MIC <=16 predicts susceptibility to the oral agents cefaclor, cefdinir, cefpodoxime, cefprozil, cefuroxime, cephalexin, and loracarbef when used for therapy of uncomplicated urinary tract infections due to E. coli, Klebsiella pneumoniae, and Proteus mirabilis. 50,000-100,000 colony forming units per mL    Antimicrobial Susceptibility Comment     Comment:       ** S = Susceptible; I = Intermediate; R = Resistant **                    P = Positive; N = Negative             MICS are expressed in micrograms per mL    Antibiotic                 RSLT#1    RSLT#2    RSLT#3    RSLT#4 Amoxicillin/Clavulanic Acid    S Ampicillin                     R Cefepime                       S Ceftriaxone                    S Cefuroxime                     S Ciprofloxacin                  S Ertapenem                      S Gentamicin  S Imipenem                       S Levofloxacin                   S Meropenem                      S Nitrofurantoin                  S Piperacillin/Tazobactam        S Tetracycline                   R Tobramycin                     S Trimethoprim/Sulfa             R   POCT urinalysis dipstick     Status: Abnormal   Collection Time: 06/14/21  1:28 PM  Result Value Ref Range   Color, UA orange (A) yellow    Comment: Pt taking AZO.   Clarity, UA clear clear   Glucose, UA negative negative mg/dL   Bilirubin, UA negative negative   Ketones, POC UA negative negative mg/dL   Spec Grav, UA 1.020 1.010 - 1.025   Blood, UA negative negative   pH, UA 7.0 5.0 - 8.0   Protein Ur, POC trace (A) negative mg/dL   Urobilinogen, UA 0.2 0.2 or 1.0 E.U./dL   Nitrite, UA Positive (A) Negative   Leukocytes, UA Small (1+) (A) Negative  Urine Culture     Status: Abnormal   Collection Time: 06/14/21  1:48 PM   Specimen: Urine, Clean Catch  Result Value Ref Range   Specimen Description URINE, CLEAN CATCH    Special Requests      Normal Performed at [facility], [address], [place], [place] [address]    Culture (A)     50,000 COLONIES/mL ESCHERICHIA COLI Confirmed Extended Spectrum Beta-Lactamase Producer (ESBL).  In bloodstream infections from ESBL organisms, carbapenems are preferred over piperacillin/tazobactam. They are shown to have a lower risk of mortality.    Report Status 06/16/2021 FINAL    Organism ID, Bacteria ESCHERICHIA COLI (A)       Susceptibility   Escherichia coli - MIC*    AMPICILLIN >=32 RESISTANT Resistant     CEFAZOLIN >=64 RESISTANT Resistant     CEFEPIME 16 RESISTANT Resistant     CEFTRIAXONE 32 RESISTANT Resistant     CIPROFLOXACIN >=4 RESISTANT Resistant     GENTAMICIN <=1 SENSITIVE Sensitive     IMIPENEM <=0.25 SENSITIVE Sensitive     NITROFURANTOIN <=16 SENSITIVE Sensitive     TRIMETH/SULFA <=20 SENSITIVE Sensitive     AMPICILLIN/SULBACTAM 4 SENSITIVE Sensitive     PIP/TAZO <=4 SENSITIVE Sensitive     * 50,000 COLONIES/mL ESCHERICHIA COLI      Assessment and Plan :   PDMP not  reviewed this encounter.  1. Acute cystitis without hematuria   2. Suprapubic pain    Creatinine clearance was calculated at 57 mL/min based off of the last creatinine level.  Given that her urine cultures have shown sensitivity to nitrofurantoin I will have her start this.  She has already undergone a course of Bactrim and actually started with this.  As her symptoms have persisted I will avoid using this again.  I am basing my antibiotic choice off of the last urine culture.  We will avoid further use of fosfomycin.  Urine culture is pending.  Emphasized need to follow-up with urology.  Provided her with information for this.  At this time, patient does not show any signs of urosepsis and therefore will defer an ER visit.  Counseled patient on potential for adverse effects with medications prescribed/recommended today, ER and return-to-clinic precautions discussed, patient verbalized understanding.    Jaynee Eagles, [place] 06/19/21 [phone]

## 2021-06-18 NOTE — Discharge Instructions (Addendum)
I have reviewed your recent urine cultures over the past month.  I know you initially had Bactrim and then you were prescribed ciprofloxacin twice.  The last urine culture done from 06/14/2021 showed an infection with E. coli that showed responded to fosfomycin but also Macrobid.  Based off of your kidney function I believe that this is the next best option for you to take as you continue to have symptoms of the urinary tract infection.  I do need you to follow-up as soon as possible with an urologist.  Please contact them first thing tomorrow.

## 2021-06-18 NOTE — ED Triage Notes (Signed)
Pt here with supra pubic pressure and painful urination x several weeks and has been treated multiple times for UTI recently. Pt was called in a Fosomycin powder yesterday and took it then based on her latest culture results. Pt reports that sx are worse.

## 2021-06-20 ENCOUNTER — Emergency Department
Admission: EM | Admit: 2021-06-20 | Discharge: 2021-06-20 | Disposition: A | Discharge status: Home / Self Care | Payer: HMO | Attending: Emergency Medicine | Admitting: Emergency Medicine

## 2021-06-20 ENCOUNTER — Other Ambulatory Visit: Payer: Self-pay

## 2021-06-20 DIAGNOSIS — R040 Epistaxis: Secondary | ICD-10-CM | POA: Insufficient documentation

## 2021-06-20 DIAGNOSIS — Z5321 Procedure and treatment not carried out due to patient leaving prior to being seen by health care provider: Secondary | ICD-10-CM | POA: Diagnosis not present

## 2021-06-20 LAB — CBC
HCT: 47.2 % — ABNORMAL HIGH (ref 36.0–46.0)
Hemoglobin: 15.4 g/dL — ABNORMAL HIGH (ref 12.0–15.0)
MCH: 29.8 pg (ref 26.0–34.0)
MCHC: 32.6 g/dL (ref 30.0–36.0)
MCV: 91.5 fL (ref 80.0–100.0)
Platelets: 186 10*3/uL (ref 150–400)
RBC: 5.16 MIL/uL — ABNORMAL HIGH (ref 3.87–5.11)
RDW: 13.1 % (ref 11.5–15.5)
WBC: 7.3 10*3/uL (ref 4.0–10.5)
nRBC: 0 % (ref 0.0–0.2)

## 2021-06-20 LAB — URINE CULTURE: Culture: <10000 — AB

## 2021-06-20 NOTE — ED Triage Notes (Signed)
Pt with left sided epistaxis that began approx one hour pta. Pt is on eliquis. Bleeding is controlled with nasal clamps in triage.

## 2021-06-20 NOTE — ED Notes (Signed)
Pt verbalizes understanding of MSE waiver. E sig pad not working.

## 2021-06-21 MED ORDER — SULFAMETHOXAZOLE-TRIMETHOPRIM 800-160 MG PO TABS: 1.0000 | ORAL_TABLET | Freq: Two times a day (BID) | ORAL | 0 refills | Status: AC

## 2021-06-21 NOTE — Telephone Encounter (Signed)
Barkley Boards, NP approves Bactrim as an alternative method of tx for patient. Sent to [facility] on [address].

## 2021-07-03 ENCOUNTER — Other Ambulatory Visit: Payer: Self-pay | Admitting: Nephrology

## 2021-07-03 ENCOUNTER — Other Ambulatory Visit (HOSPITAL_COMMUNITY): Payer: Self-pay | Admitting: Nephrology

## 2021-07-03 DIAGNOSIS — N1832 Chronic kidney disease, stage 3b: Secondary | ICD-10-CM

## 2021-07-03 DIAGNOSIS — I10 Essential (primary) hypertension: Secondary | ICD-10-CM | POA: Diagnosis not present

## 2021-07-03 DIAGNOSIS — N1831 Chronic kidney disease, stage 3a: Secondary | ICD-10-CM | POA: Diagnosis not present

## 2021-07-07 ENCOUNTER — Telehealth: Payer: Self-pay

## 2021-07-07 NOTE — Telephone Encounter (Signed)
Copied from [place] [phone]. Topic: Referral - Request for Referral >> Jul 07, 2021  8:32 AM Tessa Lerner A wrote: Has patient seen PCP for this complaint? Yes.   *If NO, is insurance requiring patient see PCP for this issue before PCP can refer them? Referral for which specialty: Urology  Preferred provider/office: [facility]  Reason for referral: Patient has a history of urinary tract infections

## 2021-07-07 NOTE — Telephone Encounter (Signed)
Please advise referral?  

## 2021-07-08 ENCOUNTER — Other Ambulatory Visit: Payer: Self-pay | Admitting: *Deleted

## 2021-07-08 DIAGNOSIS — N390 Urinary tract infection, site not specified: Secondary | ICD-10-CM

## 2021-07-08 NOTE — Telephone Encounter (Signed)
Referral ordered

## 2021-07-10 ENCOUNTER — Ambulatory Visit
Admission: RE | Admit: 2021-07-10 | Discharge: 2021-07-10 | Disposition: A | Discharge status: Home / Self Care | Payer: HMO | Source: Ambulatory Visit | Attending: Nephrology | Admitting: Nephrology

## 2021-07-10 ENCOUNTER — Other Ambulatory Visit: Payer: Self-pay

## 2021-07-10 DIAGNOSIS — N1832 Chronic kidney disease, stage 3b: Secondary | ICD-10-CM | POA: Insufficient documentation

## 2021-07-10 DIAGNOSIS — I25709 Atherosclerosis of coronary artery bypass graft(s), unspecified, with unspecified angina pectoris: Secondary | ICD-10-CM | POA: Diagnosis not present

## 2021-07-10 DIAGNOSIS — I25810 Atherosclerosis of coronary artery bypass graft(s) without angina pectoris: Secondary | ICD-10-CM | POA: Diagnosis not present

## 2021-07-10 DIAGNOSIS — E782 Mixed hyperlipidemia: Secondary | ICD-10-CM | POA: Diagnosis not present

## 2021-07-10 DIAGNOSIS — I10 Essential (primary) hypertension: Secondary | ICD-10-CM | POA: Diagnosis not present

## 2021-07-10 DIAGNOSIS — I4819 Other persistent atrial fibrillation: Secondary | ICD-10-CM | POA: Diagnosis not present

## 2021-07-10 DIAGNOSIS — G4733 Obstructive sleep apnea (adult) (pediatric): Secondary | ICD-10-CM | POA: Diagnosis not present

## 2021-07-24 ENCOUNTER — Ambulatory Visit: Payer: HMO | Admitting: Urology

## 2021-07-24 ENCOUNTER — Other Ambulatory Visit: Payer: Self-pay

## 2021-07-24 ENCOUNTER — Encounter: Payer: Self-pay | Admitting: Urology

## 2021-07-24 VITALS — BP 125/84 | HR 80 | Ht 61.0 in | Wt 185.0 lb

## 2021-07-24 DIAGNOSIS — N390 Urinary tract infection, site not specified: Secondary | ICD-10-CM | POA: Diagnosis not present

## 2021-07-24 DIAGNOSIS — R310 Gross hematuria: Secondary | ICD-10-CM

## 2021-07-24 LAB — BLADDER SCAN AMB NON-IMAGING: Scan Result: 16

## 2021-07-24 NOTE — Progress Notes (Signed)
07/24/2021 2:36 PM   [name] 09-03-1940 [id number]  Referring provider: [name], MD [address] [place],  [place] [address]  Chief Complaint  Patient presents with   Recurrent UTI    HPI: Stacey [name] is a 81 y.o. female referred for evaluation of recurrent UTIs.  Treated for persistent UTI January 2023 Urgent care visit 05/26/2021 for frequency, dysuria and gross hematuria Treated with a 7-day course of Bactrim.  Urine culture grew E. coli resistant to only ampicillin Had follow-up visit with PCP 06/05/2021 complaining of persistent symptoms after antibiotic treatment.  Dipstick UA with moderate leukocytes.  She was treated with Cipro.  Urine culture again grew E. coli resistant to ampicillin, tetracycline and Bactrim Persistent symptoms with urgent care visit 06/14/2021 and UA nitrite + with small leukocytes.  Urine culture grew ESBL E. coli with MDR and she was treated with fosfomycin Follow-up urgent care visit 06/18/2021 complaining of persistent symptoms including pelvic pressure, frequency, urgency and dysuria.  Urine culture at that visit with insignificant growth Her voiding symptoms subsequently resolved and she states she is doing much better Has had occasional UTIs over the years RUS 07/10/2021 showed mild bilateral renal cortical thinning and mild right renal atrophy.  No urinary calculi or hydronephrosis  PMH: Past Medical History:  Diagnosis Date   Abnormal nuclear stress test 05/24/2017   Arthritis    Asthma    Atrial fibrillation, transient (HCC)    Benign neoplasm of ascending colon    Benign neoplasm of descending colon    CAD (coronary artery disease)    a. s/p CABG x 3: VG->dRCA, VG->D1, LIMA->LAD   Cancer (HCC)    Complication of anesthesia    COVID-19    Depression    Family history of adverse reaction to anesthesia    most of family - PONV   Fibromyalgia    Glaucoma    Headache    migraines - 1-2x/mo    HTN (hypertension) 03/09/2007   Hyperlipidemia LDL goal <70 03/25/1993   Hypertension    Motion sickness    all moving vehicles   PONV (postoperative nausea and vomiting)    Squamous cell carcinoma of skin 07/25/2019   Left upper lateral lip above vermillion border. WD SCC with superficial infiltration., MOHs 10/02/19   Stroke ([facility]) 2019   Thyroid disease     Surgical History: Past Surgical History:  Procedure Laterality Date   ABDOMINAL HYSTERECTOMY     APPENDECTOMY     BLADDER SURGERY     CATARACT EXTRACTION W/ INTRAOCULAR LENS  IMPLANT, BILATERAL     COLONOSCOPY WITH PROPOFOL N/A 01/17/2016   Procedure: COLONOSCOPY WITH PROPOFOL;  Surgeon: [name], MD;  Location: [facility];  Service: Endoscopy;  Laterality: N/A;   CORONARY ARTERY BYPASS GRAFT N/A 02/20/2017   Procedure: CORONARY ARTERY BYPASS GRAFTING (CABG) x , three using left internal mammary artery to left anterior descending coronary artery and right greater saphenous vein harvested endoscopically to distal right and diagonal coronary arteries.;  Surgeon: Grace Isaac, MD;  Location: [facility];  Service: Open Heart Surgery;  Laterality: N/A;   LEFT HEART CATH AND CORONARY ANGIOGRAPHY N/A 02/20/2017   Procedure: LEFT HEART CATH AND CORONARY ANGIOGRAPHY;  Surgeon: Isaias Cowman, MD;  Location: [facility];  Service: Cardiovascular;  Laterality: N/A;   LEFT HEART CATH AND CORS/GRAFTS ANGIOGRAPHY N/A 05/24/2017   Procedure: LEFT HEART CATH AND CORS/GRAFTS ANGIOGRAPHY;  Surgeon: Sherren Mocha, MD;  Location: [facility];  Service: Cardiovascular;  Laterality: N/A;   NASAL SINUS SURGERY     POLYPECTOMY  01/17/2016   Procedure: POLYPECTOMY;  Surgeon: [name], MD;  Location: [facility];  Service: Endoscopy;;   RIGHT OOPHORECTOMY     TEE WITHOUT CARDIOVERSION N/A 02/20/2017   Procedure: TRANSESOPHAGEAL ECHOCARDIOGRAM (TEE);  Surgeon: Grace Isaac, MD;  Location: [facility];  Service:  Open Heart Surgery;  Laterality: N/A;    Home Medications:  Allergies as of 07/24/2021       Reactions   Cephalexin Anaphylaxis   Atorvastatin    Morphine And Related Hives   Other Hives, Other (See Comments)   Muscle pain   Procaine Other (See Comments)   Chest pain Chest pain   Shellfish Allergy Swelling   angioedema   Statins Other (See Comments)   Muscle pain   Tomato Other (See Comments)   (by testing) - also beans, wheat, peas (by testing) - also beans, wheat, peas   Penicillins Hives, Rash   Has patient had a PCN reaction causing immediate rash, facial/tongue/throat swelling, SOB or lightheadedness with hypotension: Yes Has patient had a PCN reaction causing severe rash involving mucus membranes or skin necrosis: No Has patient had a PCN reaction that required hospitalization: No Has patient had a PCN reaction occurring within the last 10 years: Yes If all of the above answers are "NO", then may proceed with Cephalosporin use.        Medication List        Accurate as of July 24, 2021  2:36 PM. If you have any questions, ask your nurse or doctor.          acyclovir 400 MG tablet Commonly known as: ZOVIRAX Take 400 mg by mouth 3 times per day for 5 days to start immediately with each episode of fever blister. May also take one a day for prevention.   albuterol 108 (90 Base) MCG/ACT inhaler Commonly known as: Ventolin HFA INHALE 2 PUFFS BY MOUTH EVERY 4 TO 6 HOURS AS NEEDED FOR SHORTNESS OF BREATH   amLODipine 5 MG tablet Commonly known as: NORVASC Take 1 tablet (5 mg total) daily by mouth.   apixaban 5 MG Tabs tablet Commonly known as: ELIQUIS Take 1 tablet (5 mg total) by mouth 2 (two) times daily.   ascorbic acid 500 MG tablet Commonly known as: VITAMIN C Take 2,000 mg by mouth daily.   aspirin 81 MG tablet Take 1 tablet (81 mg total) by mouth daily. What changed: Another medication with the same name was removed. Continue taking this medication,  and follow the directions you see here. Changed by: Abbie Sons, MD   B-12 500 MCG Tabs Take 500 mcg by mouth daily.   buPROPion 300 MG 24 hr tablet Commonly known as: WELLBUTRIN XL Take by mouth. What changed: Another medication with the same name was removed. Continue taking this medication, and follow the directions you see here. Changed by: Abbie Sons, MD   chlorthalidone 25 MG tablet Commonly known as: HYGROTON TAKE 1/2 TABLET BY MOUTH DAILY.   diazepam 5 MG tablet Commonly known as: VALIUM Take 1 tablet (5 mg total) by mouth every 12 (twelve) hours as needed for anxiety. Takes 1/2 tablet   ELDERBERRY PO Take by mouth daily. Unsure dose / combination vitamin   EPINEPHrine 0.3 mg/0.3 mL Soaj injection Commonly known as: EpiPen 2-Pak INJECT AS DIRECTED FOR SEVERE ALLERGIC REACTION What changed:  how much  to take how to take this when to take this reasons to take this additional instructions   fluconazole 200 MG tablet Commonly known as: DIFLUCAN Take 1 tablet on Monday and take 1 tablet on Thursday, repeat for 4 weeks   fluocinonide gel 0.05 % Commonly known as: LIDEX Apply 1 application topically as needed.   isosorbide mononitrate 30 MG 24 hr tablet Commonly known as: IMDUR Take 30 mg by mouth daily.   levothyroxine 100 MCG tablet Commonly known as: SYNTHROID Take by mouth. What changed: Another medication with the same name was removed. Continue taking this medication, and follow the directions you see here. Changed by: Abbie Sons, MD   metoprolol tartrate 25 MG tablet Commonly known as: LOPRESSOR One and one half tabs twice daily   mometasone 0.1 % cream Commonly known as: ELOCON Apply 1 application topically daily as needed (Rash).   Multi-Vitamins Tabs Take 1 tablet by mouth daily.   nitrofurantoin (macrocrystal-monohydrate) 100 MG capsule Commonly known as: MACROBID Take 1 capsule (100 mg total) by mouth 2 (two) times daily.    nystatin 100000 UNIT/ML suspension Commonly known as: MYCOSTATIN Take 100,000 Units by mouth 3 (three) times daily as needed. For mouth ulcers.   oxymetazoline 0.05 % nasal spray Commonly known as: AFRIN Place 1 spray into both nostrils as needed for congestion.   rosuvastatin 20 MG tablet Commonly known as: CRESTOR TAKE ONE TABLET (20 MG) BY MOUTH EVERY EVENING   sertraline 25 MG tablet Commonly known as: ZOLOFT Take by mouth. What changed: Another medication with the same name was removed. Continue taking this medication, and follow the directions you see here. Changed by: Abbie Sons, MD   tacrolimus 0.1 % ointment Commonly known as: PROTOPIC As needed for rosacea flare ups   Vitamin D 50 MCG (2000 UT) tablet Take 2,000 Units by mouth daily.   zinc gluconate 50 MG tablet Take 50 mg by mouth daily.   zolpidem 5 MG tablet Commonly known as: AMBIEN TAKE ONE TABLET BY MOUTH AT BEDTIME AS NEEDED FOR SLEEP        Allergies:  Allergies  Allergen Reactions   Cephalexin Anaphylaxis   Atorvastatin    Morphine And Related Hives   Other Hives and Other (See Comments)    Muscle pain   Procaine Other (See Comments)    Chest pain Chest pain   Shellfish Allergy Swelling    angioedema   Statins Other (See Comments)    Muscle pain   Tomato Other (See Comments)    (by testing) - also beans, wheat, peas (by testing) - also beans, wheat, peas   Penicillins Hives and Rash    Has patient had a PCN reaction causing immediate rash, facial/tongue/throat swelling, SOB or lightheadedness with hypotension: Yes Has patient had a PCN reaction causing severe rash involving mucus membranes or skin necrosis: No Has patient had a PCN reaction that required hospitalization: No Has patient had a PCN reaction occurring within the last 10 years: Yes If all of the above answers are "NO", then may proceed with Cephalosporin use.    Family History: Family History  Problem Relation Age  of Onset   Alzheimer's disease Mother    Kidney cancer Mother    Heart attack Father    Pancreatic cancer Sister    Heart attack Brother 28   Ovarian cancer Sister    Kidney disease Sister        dialysis   Cancer Sister  uterin   Diabetes Sister    Heart attack Sister    Heart attack Brother 11   Ovarian cancer Sister     Social History:  reports that she quit smoking about 46 years ago. Her smoking use included cigarettes. She has never used smokeless tobacco. She reports that she does not drink alcohol and does not use drugs.   Physical Exam: BP 125/84    Pulse 80    Ht 5\' 1"  (1.549 m)    Wt 185 lb (83.9 kg)    BMI 34.96 kg/m   Constitutional:  Alert and oriented, No acute distress. HEENT: [place] AT, moist mucus membranes.  Trachea midline, no masses. Cardiovascular: No clubbing, cyanosis, or edema. Respiratory: Normal respiratory effort, no increased work of breathing. Psychiatric: Normal mood and affect.  Laboratory Data:  Urinalysis Dipstick trace blood/2+ leukocyte/trace protein Micro 11-30 WBC   Pertinent Imaging: Ultrasound images personally reviewed and interpreted   US RENAL  Narrative CLINICAL DATA:  Stage III B chronic renal insufficiency  EXAM: RENAL / URINARY TRACT ULTRASOUND COMPLETE  COMPARISON:  12/04/2013  FINDINGS: Right Kidney:  Renal measurements: 8.8 x 4.0 x 4.0 cm = volume: 72.7 mL. Mild cortical thinning and renal atrophy. Echogenicity within normal limits. No mass or hydronephrosis visualized.  Left Kidney:  Renal measurements: 11.3 x 4.5 x 4.1 cm = volume: 111.1 mL. Mild cortical thinning. Echogenicity within normal limits. No mass or hydronephrosis visualized.  Bladder:  Appears normal for degree of bladder distention.  Other:  None.  IMPRESSION: 1. Bilateral renal cortical thinning and mild right renal atrophy. Otherwise unremarkable exam.   Electronically Signed By: Randa Ngo M.D. On: 07/11/2021  22:22    Assessment & Plan:    1. Recurrent UTI Symptoms resolved Persistent pyuria Urine culture ordered Bladder scan PVR 16 mL  2.  Gross hematuria Present at the time of active infection which is the most likely source No abnormalities on RUS Recommend cystoscopy Pelvic exam at time of cystoscopy   Abbie Sons, MD  [facility] [address] [place], [place] [address] [phone]

## 2021-07-25 LAB — URINALYSIS, COMPLETE
Bilirubin, UA: NEGATIVE
Glucose, UA: NEGATIVE
Ketones, UA: NEGATIVE
Nitrite, UA: NEGATIVE
Specific Gravity, UA: 1.02 (ref 1.005–1.030)
Urobilinogen, Ur: 0.2 mg/dL (ref 0.2–1.0)
pH, UA: 6 (ref 5.0–7.5)

## 2021-07-25 LAB — MICROSCOPIC EXAMINATION: RBC, Urine: NONE SEEN /hpf (ref 0–2)

## 2021-07-27 ENCOUNTER — Encounter: Payer: Self-pay | Admitting: Urology

## 2021-07-27 LAB — CULTURE, URINE COMPREHENSIVE

## 2021-07-31 DIAGNOSIS — N1831 Chronic kidney disease, stage 3a: Secondary | ICD-10-CM | POA: Diagnosis not present

## 2021-07-31 DIAGNOSIS — I10 Essential (primary) hypertension: Secondary | ICD-10-CM | POA: Diagnosis not present

## 2021-08-07 DIAGNOSIS — I25810 Atherosclerosis of coronary artery bypass graft(s) without angina pectoris: Secondary | ICD-10-CM | POA: Diagnosis not present

## 2021-08-07 DIAGNOSIS — I4819 Other persistent atrial fibrillation: Secondary | ICD-10-CM | POA: Diagnosis not present

## 2021-08-11 ENCOUNTER — Ambulatory Visit: Payer: HMO | Admitting: Urology

## 2021-08-11 ENCOUNTER — Encounter: Payer: Self-pay | Admitting: Urology

## 2021-08-11 ENCOUNTER — Other Ambulatory Visit: Payer: Self-pay

## 2021-08-11 ENCOUNTER — Telehealth: Payer: Self-pay | Admitting: Family Medicine

## 2021-08-11 VITALS — BP 164/90 | HR 96 | Ht 61.0 in | Wt 185.0 lb

## 2021-08-11 DIAGNOSIS — N390 Urinary tract infection, site not specified: Secondary | ICD-10-CM

## 2021-08-11 DIAGNOSIS — Z87898 Personal history of other specified conditions: Secondary | ICD-10-CM

## 2021-08-11 NOTE — Telephone Encounter (Signed)
Copied from [place] [id number]. Topic: Medicare AWV ?>> Aug 11, 2021 10:47 AM Cher Nakai R wrote: ?Reason for CRM:  ?Left message for patient to call back and schedule Medicare Annual Wellness Visit (AWV) in office.  ? ?If not able to come in office, please offer to do virtually or by telephone.  ? ?Last AWV: 02/27/2020 ? ?Please schedule at anytime with [facility] Advisor. ? ?If any questions, please contact me at [phone] ?

## 2021-08-11 NOTE — Progress Notes (Signed)
? ?  08/11/21 ? ?CC:  ?Chief Complaint  ?Patient presents with  ? Cysto  ? ? ?HPI: History gross hematuria in conjunction with UTI.  History recurrent UTI.  Refer to prior office note 07/24/2021.  She has no complaints today and denies recurrent hematuria or infections ? ?Blood pressure (!) 164/90, pulse 96, height '5\' 1"'$  (1.549 m), weight 185 lb (83.9 kg). ?NED. A&Ox3.   ?No respiratory distress   ?Abd soft, NT, ND ?Atrophic external genitalia with patent urethral meatus ? ?Cystoscopy Procedure Note ? ?Patient identification was confirmed, informed consent was obtained, and patient was prepped using Betadine solution.  Lidocaine jelly was administered per urethral meatus.   ? ?Procedure: ?- Flexible cystoscope introduced, without any difficulty.   ?- Thorough search of the bladder revealed: ?   normal urethral meatus ?   normal urothelium ?   no stones ?   no ulcers  ?   no tumors ?   no urethral polyps ?   no trabeculation ? ?- Ureteral orifices were normal in position and appearance. ? ?Post-Procedure: ?- Patient tolerated the procedure well ? ?Assessment/ Plan: ?No bladder mucosal abnormalities ?She has started taking a cranberry supplement ?Follow-up annually or as needed for recurrent UTI symptoms ? ? ?Abbie Sons, MD ? ?

## 2021-08-12 LAB — MICROSCOPIC EXAMINATION: WBC, UA: >30 /hpf — AB (ref 0–5)

## 2021-08-12 LAB — URINALYSIS, COMPLETE
Bilirubin, UA: NEGATIVE
Glucose, UA: NEGATIVE
Ketones, UA: NEGATIVE
Nitrite, UA: NEGATIVE
Protein,UA: NEGATIVE
Specific Gravity, UA: 1.015 (ref 1.005–1.030)
Urobilinogen, Ur: 0.2 mg/dL (ref 0.2–1.0)
pH, UA: 6 (ref 5.0–7.5)

## 2021-08-13 DIAGNOSIS — M25559 Pain in unspecified hip: Secondary | ICD-10-CM | POA: Insufficient documentation

## 2021-08-13 DIAGNOSIS — G8929 Other chronic pain: Secondary | ICD-10-CM | POA: Insufficient documentation

## 2021-08-13 DIAGNOSIS — M47816 Spondylosis without myelopathy or radiculopathy, lumbar region: Secondary | ICD-10-CM | POA: Insufficient documentation

## 2021-08-14 ENCOUNTER — Other Ambulatory Visit: Payer: Self-pay

## 2021-08-14 ENCOUNTER — Ambulatory Visit (INDEPENDENT_AMBULATORY_CARE_PROVIDER_SITE_OTHER): Payer: HMO

## 2021-08-14 VITALS — BP 142/80 | HR 70 | Temp 97.6°F | Ht 61.5 in | Wt 192.2 lb

## 2021-08-14 DIAGNOSIS — Z0000 Encounter for general adult medical examination without abnormal findings: Secondary | ICD-10-CM | POA: Diagnosis not present

## 2021-08-14 DIAGNOSIS — I4819 Other persistent atrial fibrillation: Secondary | ICD-10-CM | POA: Diagnosis not present

## 2021-08-14 DIAGNOSIS — I25810 Atherosclerosis of coronary artery bypass graft(s) without angina pectoris: Secondary | ICD-10-CM | POA: Diagnosis not present

## 2021-08-14 DIAGNOSIS — E782 Mixed hyperlipidemia: Secondary | ICD-10-CM | POA: Diagnosis not present

## 2021-08-14 DIAGNOSIS — I10 Essential (primary) hypertension: Secondary | ICD-10-CM | POA: Diagnosis not present

## 2021-08-14 DIAGNOSIS — G4733 Obstructive sleep apnea (adult) (pediatric): Secondary | ICD-10-CM | POA: Diagnosis not present

## 2021-08-14 DIAGNOSIS — I340 Nonrheumatic mitral (valve) insufficiency: Secondary | ICD-10-CM | POA: Diagnosis not present

## 2021-08-14 NOTE — Patient Instructions (Signed)
Ms. Nieblas , ?Thank you for taking time to come for your Medicare Wellness Visit. I appreciate your ongoing commitment to your health goals. Please review the following plan we discussed and let me know if I can assist you in the future.  ? ?Screening recommendations/referrals: ?Colonoscopy: aged out ?Mammogram: aged out ?Bone Density: 12/27/15, declined referral ?Recommended yearly ophthalmology/optometry visit for glaucoma screening and checkup ?Recommended yearly dental visit for hygiene and checkup ? ?Vaccinations: ?Influenza vaccine: n/d ?Pneumococcal vaccine: 11/22/13 ?Tdap vaccine: 09/07/15 ?Shingles vaccine: Zostavax 11/19/11   ?Covid-19:n/d ? ?Advanced directives: no ? ?Conditions/risks identified: none ? ?Next appointment: Follow up in one year for your annual wellness visit 08/19/22 @ 9:45am in person ? ? ?Preventive Care 56 Years and Older, Female ?Preventive care refers to lifestyle choices and visits with your health care provider that can promote health and wellness. ?What does preventive care include? ?A yearly physical exam. This is also called an annual well check. ?Dental exams once or twice a year. ?Routine eye exams. Ask your health care provider how often you should have your eyes checked. ?Personal lifestyle choices, including: ?Daily care of your teeth and gums. ?Regular physical activity. ?Eating a healthy diet. ?Avoiding tobacco and drug use. ?Limiting alcohol use. ?Practicing safe sex. ?Taking low-dose aspirin every day. ?Taking vitamin and mineral supplements as recommended by your health care provider. ?What happens during an annual well check? ?The services and screenings done by your health care provider during your annual well check will depend on your age, overall health, lifestyle risk factors, and family history of disease. ?Counseling  ?Your health care provider may ask you questions about your: ?Alcohol use. ?Tobacco use. ?Drug use. ?Emotional well-being. ?Home and relationship  well-being. ?Sexual activity. ?Eating habits. ?History of falls. ?Memory and ability to understand (cognition). ?Work and work Statistician. ?Reproductive health. ?Screening  ?You may have the following tests or measurements: ?Height, weight, and BMI. ?Blood pressure. ?Lipid and cholesterol levels. These may be checked every 5 years, or more frequently if you are over 27 years old. ?Skin check. ?Lung cancer screening. You may have this screening every year starting at age 46 if you have a 30-pack-year history of smoking and currently smoke or have quit within the past 15 years. ?Fecal occult blood test (FOBT) of the stool. You may have this test every year starting at age 51. ?Flexible sigmoidoscopy or colonoscopy. You may have a sigmoidoscopy every 5 years or a colonoscopy every 10 years starting at age 26. ?Hepatitis C blood test. ?Hepatitis B blood test. ?Sexually transmitted disease (STD) testing. ?Diabetes screening. This is done by checking your blood sugar (glucose) after you have not eaten for a while (fasting). You may have this done every 1-3 years. ?Bone density scan. This is done to screen for osteoporosis. You may have this done starting at age 48. ?Mammogram. This may be done every 1-2 years. Talk to your health care provider about how often you should have regular mammograms. ?Talk with your health care provider about your test results, treatment options, and if necessary, the need for more tests. ?Vaccines  ?Your health care provider may recommend certain vaccines, such as: ?Influenza vaccine. This is recommended every year. ?Tetanus, diphtheria, and acellular pertussis (Tdap, Td) vaccine. You may need a Td booster every 10 years. ?Zoster vaccine. You may need this after age 84. ?Pneumococcal 13-valent conjugate (PCV13) vaccine. One dose is recommended after age 96. ?Pneumococcal polysaccharide (PPSV23) vaccine. One dose is recommended after age 21. ?Talk to your  health care provider about which  screenings and vaccines you need and how often you need them. ?This information is not intended to replace advice given to you by your health care provider. Make sure you discuss any questions you have with your health care provider. ?Document Released: 06/07/2015 Document Revised: 01/29/2016 Document Reviewed: 03/12/2015 ?Elsevier Interactive Patient Education ? 2017 [place]. ? ?Fall Prevention in the Home ?Falls can cause injuries. They can happen to people of all ages. There are many things you can do to make your home safe and to help prevent falls. ?What can I do on the outside of my home? ?Regularly fix the edges of walkways and driveways and fix any cracks. ?Remove anything that might make you trip as you walk through a door, such as a raised step or threshold. ?Trim any bushes or trees on the path to your home. ?Use bright outdoor lighting. ?Clear any walking paths of anything that might make someone trip, such as rocks or tools. ?Regularly check to see if handrails are loose or broken. Make sure that both sides of any steps have handrails. ?Any raised decks and porches should have guardrails on the edges. ?Have any leaves, snow, or ice cleared regularly. ?Use sand or salt on walking paths during winter. ?Clean up any spills in your garage right away. This includes oil or grease spills. ?What can I do in the bathroom? ?Use night lights. ?Install grab bars by the toilet and in the tub and shower. Do not use towel bars as grab bars. ?Use non-skid mats or decals in the tub or shower. ?If you need to sit down in the shower, use a plastic, non-slip stool. ?Keep the floor dry. Clean up any water that spills on the floor as soon as it happens. ?Remove soap buildup in the tub or shower regularly. ?Attach bath mats securely with double-sided non-slip rug tape. ?Do not have throw rugs and other things on the floor that can make you trip. ?What can I do in the bedroom? ?Use night lights. ?Make sure that you have a  light by your bed that is easy to reach. ?Do not use any sheets or blankets that are too big for your bed. They should not hang down onto the floor. ?Have a firm chair that has side arms. You can use this for support while you get dressed. ?Do not have throw rugs and other things on the floor that can make you trip. ?What can I do in the kitchen? ?Clean up any spills right away. ?Avoid walking on wet floors. ?Keep items that you use a lot in easy-to-reach places. ?If you need to reach something above you, use a strong step stool that has a grab bar. ?Keep electrical cords out of the way. ?Do not use floor polish or wax that makes floors slippery. If you must use wax, use non-skid floor wax. ?Do not have throw rugs and other things on the floor that can make you trip. ?What can I do with my stairs? ?Do not leave any items on the stairs. ?Make sure that there are handrails on both sides of the stairs and use them. Fix handrails that are broken or loose. Make sure that handrails are as long as the stairways. ?Check any carpeting to make sure that it is firmly attached to the stairs. Fix any carpet that is loose or worn. ?Avoid having throw rugs at the top or bottom of the stairs. If you do have throw rugs, attach them  to the floor with carpet tape. ?Make sure that you have a light switch at the top of the stairs and the bottom of the stairs. If you do not have them, ask someone to add them for you. ?What else can I do to help prevent falls? ?Wear shoes that: ?Do not have high heels. ?Have rubber bottoms. ?Are comfortable and fit you well. ?Are closed at the toe. Do not wear sandals. ?If you use a stepladder: ?Make sure that it is fully opened. Do not climb a closed stepladder. ?Make sure that both sides of the stepladder are locked into place. ?Ask someone to hold it for you, if possible. ?Clearly mark and make sure that you can see: ?Any grab bars or handrails. ?First and last steps. ?Where the edge of each step  is. ?Use tools that help you move around (mobility aids) if they are needed. These include: ?Canes. ?Walkers. ?Scooters. ?Crutches. ?Turn on the lights when you go into a dark area. Replace any light bulbs as soon as

## 2021-08-14 NOTE — Progress Notes (Signed)
Subjective:   Stacey [name] is a 81 y.o. female who presents for Medicare Annual (Subsequent) preventive examination.  Review of Systems           Objective:    Today's Vitals   08/14/21 0947  BP: (!) 142/80  Pulse: 70  Temp: 97.6 F (36.4 C)  TempSrc: Oral  SpO2: 98%  Weight: 192 lb 3.2 oz (87.2 kg)  Height: 5' 1.5" (1.562 m)   Body mass index is 35.73 kg/m.     10/25/2020    7:40 PM 02/27/2020    9:39 AM 02/21/2019    2:01 PM 11/30/2018    5:54 PM 10/26/2018    6:46 PM 10/25/2018   11:32 PM 03/21/2018    6:07 PM  Advanced Directives  Does Patient Have a Medical Advance Directive? No Yes No Yes No No   Type of Corporate treasurer of [place];Living will  [place];Living will     Does patient want to make changes to medical advance directive?   No - Patient declined No - Patient declined     Copy of [place] in Chart?  No - copy requested  No - copy requested     Would patient like information on creating a medical advance directive?     No - Patient declined No - Patient declined No - Patient declined    Current Medications (verified) Outpatient Encounter Medications as of 08/14/2021  Medication Sig   apixaban (ELIQUIS) 5 MG TABS tablet Take 1 tablet by mouth 2 (two) times daily.   acyclovir (ZOVIRAX) 400 MG tablet Take 400 mg by mouth 3 times per day for 5 days to start immediately with each episode of fever blister. May also take one a day for prevention.   albuterol (VENTOLIN HFA) 108 (90 Base) MCG/ACT inhaler INHALE 2 PUFFS BY MOUTH EVERY 4 TO 6 HOURS AS NEEDED FOR SHORTNESS OF BREATH   amLODipine (NORVASC) 5 MG tablet Take 1 tablet (5 mg total) daily by mouth.   apixaban (ELIQUIS) 5 MG TABS tablet Take 1 tablet (5 mg total) by mouth 2 (two) times daily.   ascorbic acid (VITAMIN C) 500 MG tablet Take 2,000 mg by mouth daily.    aspirin 81 MG tablet Take 1 tablet (81 mg total) by mouth daily.   buPROPion  (WELLBUTRIN XL) 300 MG 24 hr tablet Take by mouth.   chlorthalidone (HYGROTON) 25 MG tablet TAKE 1/2 TABLET BY MOUTH DAILY.   Cholecalciferol (VITAMIN D) 2000 units tablet Take 2,000 Units by mouth daily.   Cyanocobalamin (B-12) 500 MCG TABS Take 500 mcg by mouth daily.    diazepam (VALIUM) 5 MG tablet Take 1 tablet (5 mg total) by mouth every 12 (twelve) hours as needed for anxiety. Takes 1/2 tablet   ELDERBERRY PO Take by mouth daily. Unsure dose / combination vitamin   EPINEPHrine (EPIPEN 2-PAK) 0.3 mg/0.3 mL IJ SOAJ injection INJECT AS DIRECTED FOR SEVERE ALLERGIC REACTION (Patient taking differently: Inject 0.3 mg into the muscle as needed (for severe allergic reaction).)   fluconazole (DIFLUCAN) 200 MG tablet Take 1 tablet on Monday and take 1 tablet on Thursday, repeat for 4 weeks   fluocinonide gel (LIDEX) 7.82 % Apply 1 application topically as needed.   isosorbide mononitrate (IMDUR) 30 MG 24 hr tablet Take 30 mg by mouth daily.   levothyroxine (SYNTHROID) 100 MCG tablet Take by mouth.   metoprolol tartrate (LOPRESSOR) 25 MG tablet One and  one half tabs twice daily   mometasone (ELOCON) 0.1 % cream Apply 1 application topically daily as needed (Rash).   Multiple Vitamin (MULTI-VITAMINS) TABS Take 1 tablet by mouth daily.   nitrofurantoin, macrocrystal-monohydrate, (MACROBID) 100 MG capsule Take 1 capsule (100 mg total) by mouth 2 (two) times daily.   nystatin (MYCOSTATIN) 100000 UNIT/ML suspension Take 100,000 Units by mouth 3 (three) times daily as needed. For mouth ulcers.   oxymetazoline (AFRIN) 0.05 % nasal spray Place 1 spray into both nostrils as needed for congestion.   rosuvastatin (CRESTOR) 20 MG tablet TAKE ONE TABLET (20 MG) BY MOUTH EVERY EVENING   sertraline (ZOLOFT) 25 MG tablet Take by mouth.   tacrolimus (PROTOPIC) 0.1 % ointment As needed for rosacea flare ups   zinc gluconate 50 MG tablet Take 50 mg by mouth daily.    zolpidem (AMBIEN) 5 MG tablet TAKE ONE TABLET BY  MOUTH AT BEDTIME AS NEEDED FOR SLEEP   No facility-administered encounter medications on file as of 08/14/2021.    Allergies (verified) Cephalexin, Atorvastatin, Morphine and related, Other, Procaine, Shellfish allergy, Statins, Tomato, and Penicillins   History: Past Medical History:  Diagnosis Date   Abnormal nuclear stress test 05/24/2017   Arthritis    Asthma    Atrial fibrillation, transient (HCC)    Benign neoplasm of ascending colon    Benign neoplasm of descending colon    CAD (coronary artery disease)    a. s/p CABG x 3: VG->dRCA, VG->D1, LIMA->LAD   Cancer (HCC)    Complication of anesthesia    COVID-19    Depression    Family history of adverse reaction to anesthesia    most of family - PONV   Fibromyalgia    Glaucoma    Headache    migraines - 1-2x/mo   HTN (hypertension) 03/09/2007   Hyperlipidemia LDL goal <70 03/25/1993   Hypertension    Motion sickness    all moving vehicles   PONV (postoperative nausea and vomiting)    Squamous cell carcinoma of skin 07/25/2019   Left upper lateral lip above vermillion border. WD SCC with superficial infiltration., MOHs 10/02/19   Stroke ([place]) 2019   Thyroid disease    Past Surgical History:  Procedure Laterality Date   ABDOMINAL HYSTERECTOMY     APPENDECTOMY     BLADDER SURGERY     CATARACT EXTRACTION W/ INTRAOCULAR LENS  IMPLANT, BILATERAL     COLONOSCOPY WITH PROPOFOL N/A 01/17/2016   Procedure: COLONOSCOPY WITH PROPOFOL;  Surgeon: [name], MD;  Location: [place];  Service: Endoscopy;  Laterality: N/A;   CORONARY ARTERY BYPASS GRAFT N/A 02/20/2017   Procedure: CORONARY ARTERY BYPASS GRAFTING (CABG) x , three using left internal mammary artery to left anterior descending coronary artery and right greater saphenous vein harvested endoscopically to distal right and diagonal coronary arteries.;  Surgeon: Grace Isaac, MD;  Location: [place];  Service: Open Heart Surgery;  Laterality: N/A;   LEFT HEART  CATH AND CORONARY ANGIOGRAPHY N/A 02/20/2017   Procedure: LEFT HEART CATH AND CORONARY ANGIOGRAPHY;  Surgeon: Isaias Cowman, MD;  Location: [place] [facility];  Service: Cardiovascular;  Laterality: N/A;   LEFT HEART CATH AND CORS/GRAFTS ANGIOGRAPHY N/A 05/24/2017   Procedure: LEFT HEART CATH AND CORS/GRAFTS ANGIOGRAPHY;  Surgeon: Sherren Mocha, MD;  Location: [place] [facility];  Service: Cardiovascular;  Laterality: N/A;   NASAL SINUS SURGERY     POLYPECTOMY  01/17/2016   Procedure: POLYPECTOMY;  Surgeon: [name], MD;  Location: [place];  Service: Endoscopy;;   RIGHT OOPHORECTOMY     TEE WITHOUT CARDIOVERSION N/A 02/20/2017   Procedure: TRANSESOPHAGEAL ECHOCARDIOGRAM (TEE);  Surgeon: Grace Isaac, MD;  Location: [place];  Service: Open Heart Surgery;  Laterality: N/A;   Family History  Problem Relation Age of Onset   Alzheimer's disease Mother    Kidney cancer Mother    Heart attack Father    Pancreatic cancer Sister    Heart attack Brother 32   Ovarian cancer Sister    Kidney disease Sister        dialysis   Cancer Sister        uterin   Diabetes Sister    Heart attack Sister    Heart attack Brother 26   Ovarian cancer Sister    Social History   Socioeconomic History   Marital status: Married    Spouse name: [name]   Number of children: 3   Years of education: Not on file   Highest education level: GED or equivalent  Occupational History    Employer: [employer]  Tobacco Use   Smoking status: Former    Types: Cigarettes    Quit date: 05/26/1975    Years since quitting: 46.2   Smokeless tobacco: Never   Tobacco comments:    quit 45  years ago   Vaping Use   Vaping Use: Never used  Substance and Sexual Activity   Alcohol use: No   Drug use: No   Sexual activity: Not Currently  Other Topics Concern   Not on file  Social History Narrative   Not on file   Social Determinants of Health   Financial Resource Strain: Not on file   Food Insecurity: Not on file  Transportation Needs: Not on file  Physical Activity: Not on file  Stress: Not on file  Social Connections: Not on file    Tobacco Counseling Counseling given: Not Answered Tobacco comments: quit 45  years ago    Clinical Intake:  Pre-visit preparation completed: Yes  Pain : No/denies pain     Diabetes: No CBG done?: No Did pt. bring in CBG monitor from home?: No  How often do you need to have someone help you when you read instructions, pamphlets, or other written materials from your doctor or pharmacy?: 1 - Never  Diabetic?no  Interpreter Needed?: No  Information entered by :: Kirke Shaggy, LPN   Activities of Daily Living     View : No data to display.          Patient Care Team: [name], MD as PCP - General (Family Medicine) Leandrew Koyanagi, MD as Referring Physician (Ophthalmology) Corey Skains, MD as Consulting Physician (Cardiology) Beverly Gust, MD (Otolaryngology) Germaine Pomfret, [facility] (Pharmacist)  Indicate any recent Medical Services you may have received from other than [facility] providers in the past year (date may be approximate).     Assessment:   This is a routine wellness examination for [place].  Hearing/Vision screen No results found.  Dietary issues and exercise activities discussed:     Goals Addressed   None    Depression Screen    02/27/2020    9:36 AM 02/21/2019    2:02 PM 08/22/2018    8:26 AM 12/20/2017    2:15 PM 04/27/2017    4:13 PM 03/22/2017    3:59 PM 12/17/2016    2:59 PM  PHQ 2/9 Scores  PHQ - 2 Score 1 2 2  $'2 3 6 'L$ 0  PHQ- 9 Score  '7  15 10 19 6    '$ Fall Risk    02/27/2020    9:39 AM 02/21/2019    2:02 PM 08/22/2018    8:26 AM 12/20/2017    2:15 PM 12/17/2016    2:56 PM  Fall Risk   Falls in the past year? 0 0 1 No No  Number falls in past yr: 0 0 0    Injury with Fall? 0 0 0      FALL RISK PREVENTION PERTAINING TO THE HOME:  Any stairs in or  around the home? Yes  If so, are there any without handrails? No  Home free of loose throw rugs in walkways, pet beds, electrical cords, etc? Yes  Adequate lighting in your home to reduce risk of falls? Yes Yes   ASSISTIVE DEVICES UTILIZED TO PREVENT FALLS:  Life alert? No  Use of a cane, walker or w/c? No  Grab bars in the bathroom? Yes  Shower chair or bench in shower? Yes  Elevated toilet seat or a handicapped toilet? No   TIMED UP AND GO:  Was the test performed? Yes .  Length of time to ambulate 10 feet: 4 sec.   Gait steady and fast without use of assistive device  Cognitive Function:        02/27/2020    9:48 AM 12/17/2016    2:59 PM  6CIT Screen  What Year? 0 points 0 points  What month? 0 points 0 points  What time? 0 points 0 points  Count back from 20 0 points 0 points  Months in reverse 0 points 0 points  Repeat phrase 4 points 2 points  Total Score 4 points 2 points    Immunizations Immunization History  Administered Date(s) Administered   Fluad Quad(high Dose 65+) 02/21/2019   Influenza, High Dose Seasonal PF 02/17/2017, 02/14/2018   Influenza,inj,Quad PF,6+ Mos 01/24/2015   Influenza-Unspecified 02/16/2017   Pneumococcal Conjugate-13 11/22/2013   Pneumococcal Polysaccharide-23 03/30/2005, 11/19/2011   Tdap 10/01/2005   Zoster, Live 11/19/2011    TDAP status: Up to date  Flu Vaccine status: Declined, Education has been provided regarding the importance of this vaccine but patient still declined. Advised may receive this vaccine at local pharmacy or Health Dept. Aware to provide a copy of the vaccination record if obtained from local pharmacy or Health Dept. Verbalized acceptance and understanding.  Pneumococcal vaccine status: Up to date  Covid-19 vaccine status: Declined, Education has been provided regarding the importance of this vaccine but patient still declined. Advised may receive this vaccine at local pharmacy or Health Dept.or vaccine  clinic. Aware to provide a copy of the vaccination record if obtained from local pharmacy or Health Dept. Verbalized acceptance and understanding.  Qualifies for Shingles Vaccine? Yes   Zostavax completed Yes   Shingrix Completed?: No.    Education has been provided regarding the importance of this vaccine. Patient has been advised to call insurance company to determine out of pocket expense if they have not yet received this vaccine. Advised may also receive vaccine at local pharmacy or Health Dept. Verbalized acceptance and understanding.  Screening Tests Health Maintenance  Topic Date Due   COVID-19 Vaccine (1) Never done   Zoster Vaccines- Shingrix (1 of 2) Never done   MAMMOGRAM  02/29/2020   INFLUENZA VACCINE  12/23/2020   TETANUS/TDAP  09/06/2025   Pneumonia Vaccine 68+ Years old  Completed   DEXA SCAN  Completed  HPV VACCINES  Aged Out    Health Maintenance  Health Maintenance Due  Topic Date Due   COVID-19 Vaccine (1) Never done   Zoster Vaccines- Shingrix (1 of 2) Never done   MAMMOGRAM  02/29/2020   INFLUENZA VACCINE  12/23/2020    Colorectal cancer screening: No longer required.   Mammogram status: No longer required due to age.  Bone Density status: Completed 12/27/15. Results reflect: Bone density results: NORMAL. Repeat every 5 years.- declined referral  Lung Cancer Screening: (Low Dose CT Chest recommended if Age 59-80 years, 30 pack-year currently smoking OR have quit w/in 15years.) does not qualify.   Additional Screening:  Hepatitis C Screening: does not qualify; Completed no  Vision Screening: Recommended annual ophthalmology exams for early detection of glaucoma and other disorders of the eye. Is the patient up to date with their annual eye exam?  Yes  Who is the provider or what is the name of the office in which the patient attends annual eye exams? [facility] If pt is not established with a provider, would they like to be referred to a  provider to establish care? No .   Dental Screening: Recommended annual dental exams for proper oral hygiene  Community Resource Referral / Chronic Care Management: CRR required this visit?  No   CCM required this visit?  No      Plan:     I have personally reviewed and noted the following in the patient's chart:   Medical and social history Use of alcohol, tobacco or illicit drugs  Current medications and supplements including opioid prescriptions.  Functional ability and status Nutritional status Physical activity Advanced directives List of other physicians Hospitalizations, surgeries, and ER visits in previous 12 months Vitals Screenings to include cognitive, depression, and falls Referrals and appointments  In addition, I have reviewed and discussed with patient certain preventive protocols, quality metrics, and best practice recommendations. A written personalized care plan for preventive services as well as general preventive health recommendations were provided to patient.     Dionisio David, LPN   [id number]   Nurse Notes: none

## 2021-08-29 ENCOUNTER — Other Ambulatory Visit: Payer: Self-pay | Admitting: Family Medicine

## 2021-08-29 ENCOUNTER — Other Ambulatory Visit: Payer: Self-pay | Admitting: Physician Assistant

## 2021-08-29 DIAGNOSIS — F5101 Primary insomnia: Secondary | ICD-10-CM

## 2021-08-29 NOTE — Telephone Encounter (Signed)
Requested medications are due for refill today.  yes ? ?Requested medications are on the active medications list.  yes ? ?Last refill. 02/10/2021 #30 1 refill ? ?Future visit scheduled.   no ? ?Notes to clinic.  Medication is not delegated. ? ? ? ?Requested Prescriptions  ?Pending Prescriptions Disp Refills  ? diazepam (VALIUM) 5 MG tablet [Pharmacy Med Name: DIAZEPAM 5 MG TAB] 30 tablet   ?  Sig: TAKE ONE TABLET EVERY 12 HOURS AS NEEDEDFOR ANXIETY  ?  ? Not Delegated - Psychiatry: Anxiolytics/Hypnotics 2 Failed - 08/29/2021  1:14 PM  ?  ?  Failed - This refill cannot be delegated  ?  ?  Failed - Urine Drug Screen completed in last 360 days  ?  ?  Passed - Patient is not pregnant  ?  ?  Passed - Valid encounter within last 6 months  ?  Recent Outpatient Visits   ? ?      ? 2 months ago Acute cystitis with hematuria  ? [place], PA-C  ? 3 months ago Essential hypertension  ? [facility] Thedore Mins, Ria Comment, PA-C  ? 1 year ago Cellulitis of right lower extremity  ? [place], [place]  ? 1 year ago Cellulitis of right lower extremity  ? [facility] [name], MD  ? 1 year ago Essential hypertension  ? [facility] [name], MD  ? ?  ?  ?Future Appointments   ? ?        ? In 4 months Ralene Bathe, MD [place]  ? ?  ? ?  ?  ?  ?  ?

## 2021-09-08 ENCOUNTER — Telehealth: Payer: Self-pay

## 2021-09-08 NOTE — Telephone Encounter (Signed)
Stacey [name] called to leave a message about [place].   She is hurting really bad and wants to talk to Dr. [name] direct about her. Stacey [name] said he didn't know what else to do with her and was hoping that Dr. [name] could help them.

## 2021-09-08 NOTE — Telephone Encounter (Signed)
Please advise 

## 2021-09-19 DIAGNOSIS — M19019 Primary osteoarthritis, unspecified shoulder: Secondary | ICD-10-CM | POA: Diagnosis not present

## 2021-09-19 DIAGNOSIS — M7541 Impingement syndrome of right shoulder: Secondary | ICD-10-CM | POA: Diagnosis not present

## 2021-09-20 ENCOUNTER — Other Ambulatory Visit: Payer: Self-pay | Admitting: Physician Assistant

## 2021-10-01 DIAGNOSIS — M25511 Pain in right shoulder: Secondary | ICD-10-CM | POA: Diagnosis not present

## 2021-10-16 ENCOUNTER — Other Ambulatory Visit: Payer: Self-pay | Admitting: Physician Assistant

## 2021-10-16 DIAGNOSIS — F5101 Primary insomnia: Secondary | ICD-10-CM

## 2021-10-17 ENCOUNTER — Ambulatory Visit
Admission: EM | Admit: 2021-10-17 | Discharge: 2021-10-17 | Disposition: A | Discharge status: Home / Self Care | Payer: PPO | Attending: Family Medicine | Admitting: Family Medicine

## 2021-10-17 ENCOUNTER — Encounter: Payer: Self-pay | Admitting: Emergency Medicine

## 2021-10-17 DIAGNOSIS — N3001 Acute cystitis with hematuria: Secondary | ICD-10-CM | POA: Diagnosis not present

## 2021-10-17 LAB — POCT URINALYSIS DIP (MANUAL ENTRY)
Bilirubin, UA: NEGATIVE
Glucose, UA: NEGATIVE mg/dL
Ketones, POC UA: NEGATIVE mg/dL
Nitrite, UA: NEGATIVE
Protein Ur, POC: 30 mg/dL — AB
Spec Grav, UA: 1.025 (ref 1.010–1.025)
Urobilinogen, UA: 0.2 E.U./dL
pH, UA: 6 (ref 5.0–8.0)

## 2021-10-17 MED ORDER — FOSFOMYCIN TROMETHAMINE 3 G PO PACK: 3.0000 g | PACK | Freq: Once | ORAL | 0 refills | Status: AC

## 2021-10-17 NOTE — ED Triage Notes (Signed)
Pt presents with dysuria for about 3 days

## 2021-10-17 NOTE — ED Provider Notes (Signed)
Stacey [name]    CSN: [id number] Arrival date & time: 10/17/21  1740      History   Chief Complaint Chief Complaint  Patient presents with   Dysuria    HPI Stacey [name] is a 81 y.o. female.   HPI Patient presents for evaluation of dysuria and urine frequency. Patient has a history of recurrent UTI. Current symptoms of dysuria, frequency, and lower pelvic pressure.  Past Medical History:  Diagnosis Date   Abnormal nuclear stress test 05/24/2017   Arthritis    Asthma    Atrial fibrillation, transient (HCC)    Benign neoplasm of ascending colon    Benign neoplasm of descending colon    CAD (coronary artery disease)    a. s/p CABG x 3: VG->dRCA, VG->D1, LIMA->LAD   Cancer (HCC)    Complication of anesthesia    COVID-19    Depression    Family history of adverse reaction to anesthesia    most of family - PONV   Fibromyalgia    Glaucoma    Headache    migraines - 1-2x/mo   HTN (hypertension) 03/09/2007   Hyperlipidemia LDL goal <70 03/25/1993   Hypertension    Motion sickness    all moving vehicles   PONV (postoperative nausea and vomiting)    Squamous cell carcinoma of skin 07/25/2019   Left upper lateral lip above vermillion border. WD SCC with superficial infiltration., MOHs 10/02/19   Stroke ([place]) 2019   Thyroid disease     Patient Active Problem List   Diagnosis Date Noted   Arthropathy of lumbar facet joint 08/13/2021   Hip pain 08/13/2021   Chronic low back pain 08/13/2021   Acute cystitis with hematuria 06/05/2021   OSA (obstructive sleep apnea) 02/06/2020   Lymphedema 01/19/2020   Cellulitis of right lower extremity 11/30/2019   Personal history of other malignant neoplasm of skin 10/02/2019   Chest pain 10/26/2018   TIA (transient ischemic attack) 03/21/2018   CVA (cerebral vascular accident) ([place]) 10/22/2017   Osteoarthritis of right hip 07/30/2017   Sciatic pain, right 07/02/2017   Abnormal nuclear stress test 05/24/2017    Coronary artery disease involving coronary bypass graft of native heart 03/15/2017   PAF (paroxysmal atrial fibrillation) (HCC)    Non-STEMI (non-ST elevated myocardial infarction) ([place]) 02/20/2017   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   Moderate mitral insufficiency 01/11/2017   Pain in left knee 09/09/2016   Personal history of colonic polyps    Disequilibrium 09/16/2015   Panic attacks 09/11/2015   Ganglion cyst 08/16/2015   Hypernatremia 07/04/2015   Other osteoarthritis involving multiple joints 02/06/2015   Clinical depression 09/27/2014   Dry mouth 09/27/2014   Fibrositis 09/27/2014   LBP (low back pain) 09/27/2014   Avitaminosis D 09/27/2014   Decreased leukocytes 09/27/2014   Prediabetes 05/03/2009   Insomnia 09/29/2007   Essential hypertension 03/09/2007   Adaptation reaction 01/29/2007   Acid reflux 10/07/2006   Menopausal and postmenopausal disorder 10/05/2006   Headache, migraine 10/05/2006   Arthritis, degenerative 03/23/1994   Adult hypothyroidism 04/15/1993   Hyperlipidemia LDL goal <70 03/25/1993    Past Surgical History:  Procedure Laterality Date   ABDOMINAL HYSTERECTOMY     APPENDECTOMY     BLADDER SURGERY     CATARACT EXTRACTION W/ INTRAOCULAR LENS  IMPLANT, BILATERAL     COLONOSCOPY WITH PROPOFOL N/A 01/17/2016   Procedure: COLONOSCOPY WITH PROPOFOL;  Surgeon: [name], MD;  Location: [place];  Service: Endoscopy;  Laterality: N/A;   CORONARY ARTERY BYPASS GRAFT N/A 02/20/2017   Procedure: CORONARY ARTERY BYPASS GRAFTING (CABG) x , three using left internal mammary artery to left anterior descending coronary artery and right greater saphenous vein harvested endoscopically to distal right and diagonal coronary arteries.;  Surgeon: Grace Isaac, MD;  Location: [place];  Service: Open Heart Surgery;  Laterality: N/A;   LEFT HEART CATH AND CORONARY ANGIOGRAPHY N/A 02/20/2017   Procedure: LEFT HEART CATH AND CORONARY ANGIOGRAPHY;  Surgeon: Isaias Cowman, MD;  Location: [place] [facility];  Service: Cardiovascular;  Laterality: N/A;   LEFT HEART CATH AND CORS/GRAFTS ANGIOGRAPHY N/A 05/24/2017   Procedure: LEFT HEART CATH AND CORS/GRAFTS ANGIOGRAPHY;  Surgeon: Sherren Mocha, MD;  Location: [place] [facility];  Service: Cardiovascular;  Laterality: N/A;   NASAL SINUS SURGERY     POLYPECTOMY  01/17/2016   Procedure: POLYPECTOMY;  Surgeon: [name], MD;  Location: [facility];  Service: Endoscopy;;   RIGHT OOPHORECTOMY     TEE WITHOUT CARDIOVERSION N/A 02/20/2017   Procedure: TRANSESOPHAGEAL ECHOCARDIOGRAM (TEE);  Surgeon: Grace Isaac, MD;  Location: [facility];  Service: Open Heart Surgery;  Laterality: N/A;    OB History     Gravida  1   Para  1   Term      Preterm      AB      Living         SAB      IAB      Ectopic      Multiple      Live Births               Home Medications    Prior to Admission medications   Medication Sig Start Date End Date Taking? Authorizing Provider  fosfomycin (MONUROL) 3 g PACK Take 3 g by mouth once for 1 dose. 10/17/21 10/17/21 Yes Scot Jun, FNP  acyclovir (ZOVIRAX) 400 MG tablet Take 400 mg by mouth 3 times per day for 5 days to start immediately with each episode of fever blister. May also take one a day for prevention. 01/09/21   Ralene Bathe, MD  albuterol (VENTOLIN HFA) 108 (90 Base) MCG/ACT inhaler INHALE 2 PUFFS BY MOUTH EVERY 4 TO 6 HOURS AS NEEDED FOR SHORTNESS OF BREATH 08/15/18   [name], MD  amLODipine (NORVASC) 5 MG tablet Take 1 tablet (5 mg total) daily by mouth. 04/04/17   [name], MD  apixaban (ELIQUIS) 5 MG TABS tablet Take 1 tablet (5 mg total) by mouth 2 (two) times daily. 10/24/17   Gladstone Lighter, MD  ascorbic acid (VITAMIN C) 500 MG tablet Take 2,000 mg by mouth daily.     [provider]  aspirin 81 MG tablet Take 1 tablet (81 mg total) by mouth daily. 10/24/17   Gladstone Lighter, MD   buPROPion (WELLBUTRIN XL) 300 MG 24 hr tablet Take by mouth. 06/15/20   [provider]  chlorthalidone (HYGROTON) 25 MG tablet TAKE 1/2 TABLET BY MOUTH DAILY. 06/22/20   [name], MD  Cholecalciferol (VITAMIN D) 2000 units tablet Take 2,000 Units by mouth daily.    [provider]  Cyanocobalamin (B-12) 500 MCG TABS Take 500 mcg by mouth daily.     [provider]  diazepam (VALIUM) 5 MG tablet TAKE ONE TABLET EVERY 12 HOURS AS NEEDEDFOR ANXIETY 09/01/21   [name], MD  ELDERBERRY PO Take by  mouth daily. Unsure dose / combination vitamin    [provider]  EPINEPHrine (EPIPEN 2-PAK) 0.3 mg/0.3 mL IJ SOAJ injection INJECT AS DIRECTED FOR SEVERE ALLERGIC REACTION Patient taking differently: Inject 0.3 mg into the muscle as needed (for severe allergic reaction). 12/17/16   Mar Daring, PA-C  fluconazole (DIFLUCAN) 200 MG tablet Take 1 tablet on Monday and take 1 tablet on Thursday, repeat for 4 weeks Patient not taking: Reported on 08/14/2021 03/17/21   Ralene Bathe, MD  fluocinonide gel (LIDEX) 8.65 % Apply 1 application topically as needed. 10/22/18   [provider]  isosorbide mononitrate (IMDUR) 30 MG 24 hr tablet Take 30 mg by mouth daily. 06/01/20   [provider]  levothyroxine (SYNTHROID) 100 MCG tablet TAKE 1 TABLET BY MOUTH DAILY 09/22/21   Mikey Kirschner, PA-C  metoprolol tartrate (LOPRESSOR) 25 MG tablet One and one half tabs twice daily 03/26/21   [name], MD  mometasone (ELOCON) 0.1 % cream Apply 1 application topically daily as needed (Rash). 02/17/21   Ralene Bathe, MD  Multiple Vitamin (MULTI-VITAMINS) TABS Take 1 tablet by mouth daily.    [provider]  nitrofurantoin, macrocrystal-monohydrate, (MACROBID) 100 MG capsule Take 1 capsule (100 mg total) by mouth 2 (two) times daily. Patient not taking: Reported on 08/14/2021 06/18/21   Jaynee Eagles, PA-C  nystatin  (MYCOSTATIN) 100000 UNIT/ML suspension Take 100,000 Units by mouth 3 (three) times daily as needed. For mouth ulcers. Patient not taking: Reported on 08/14/2021 10/22/18   [provider]  oxymetazoline (AFRIN) 0.05 % nasal spray Place 1 spray into both nostrils as needed for congestion.    [provider]  rosuvastatin (CRESTOR) 20 MG tablet TAKE ONE TABLET (20 MG) BY MOUTH EVERY EVENING 03/10/21   [name], MD  sertraline (ZOLOFT) 25 MG tablet Take by mouth.    [provider]  tacrolimus (PROTOPIC) 0.1 % ointment As needed for rosacea flare ups 11/24/18   [provider]  zinc gluconate 50 MG tablet Take 50 mg by mouth daily.     [provider]  zolpidem (AMBIEN) 5 MG tablet TAKE ONE TABLET BY MOUTH AT BEDTIME AS NEEDED FOR SLEEP 10/17/21   [name], MD    Family History Family History  Problem Relation Age of Onset   Alzheimer's disease Mother    Kidney cancer Mother    Heart attack Father    Pancreatic cancer Sister    Heart attack Brother 59   Ovarian cancer Sister    Kidney disease Sister        dialysis   Cancer Sister        uterin   Diabetes Sister    Heart attack Sister    Heart attack Brother 35   Ovarian cancer Sister     Social History Social History   Tobacco Use   Smoking status: Former    Types: Cigarettes    Quit date: 05/26/1975    Years since quitting: 46.4   Smokeless tobacco: Never   Tobacco comments:    quit 45  years ago   Vaping Use   Vaping Use: Never used  Substance Use Topics   Alcohol use: No   Drug use: No     Allergies   Cephalexin, Atorvastatin, Morphine and related, Other, Procaine, Shellfish allergy, Statins, Tomato, and Penicillins   Review of Systems Review of Systems Pertinent negatives listed in HPI  Physical Exam Triage Vital  Signs ED Triage Vitals  Enc Vitals Group     BP 10/17/21 1758 (!) 149/83     Pulse Rate 10/17/21 1756 77     Resp 10/17/21 1756  16     Temp 10/17/21 1756 97.8 F (36.6 C)     Temp Source 10/17/21 1756 Oral     SpO2 10/17/21 1756 98 %     Weight --      Height --      Head Circumference --      Peak Flow --      Pain Score 10/17/21 1755 6     Pain Loc --      Pain Edu? --      Excl. in [place]? --    No data found.  Updated Vital Signs BP (!) 149/83 (BP Location: Left Arm)   Pulse 77   Temp 97.8 F (36.6 C) (Oral)   Resp 16   SpO2 98%   Visual Acuity Right Eye Distance:   Left Eye Distance:   Bilateral Distance:    Right Eye Near:   Left Eye Near:    Bilateral Near:     Physical Exam General appearance: Alert, well developed, well nourished, cooperative and in no distress Head: Normocephalic, without obvious abnormality, atraumatic Respiratory: Respirations even and unlabored, normal respiratory rate Heart: rate and rhythm normal.   CVA:  bilateral CVA tenderness Extremities: No gross deformities Skin: Skin color, texture, turgor normal. No rashes seen  Psych: Appropriate mood and affect.   UC Treatments / Results  Labs (all labs ordered are listed, but only abnormal results are displayed) Labs Reviewed  POCT URINALYSIS DIP (MANUAL ENTRY) - Abnormal; Notable for the following components:      Result Value   Clarity, UA cloudy (*)    Blood, UA moderate (*)    Protein Ur, POC =30 (*)    Leukocytes, UA Large (3+) (*)    All other components within normal limits  URINE CULTURE    EKG   Radiology No results found.  Procedures Procedures (including critical care time)  Medications Ordered in UC Medications - No data to display  Initial Impression / Assessment and Plan / UC Course  I have reviewed the triage vital signs and the nursing notes.  Pertinent labs & imaging results that were available during my care of the patient were reviewed by me and considered in my medical decision making (see chart for details).    Reviewed patient chart. She had a complicated UTI in January  which required multiple different antibiotics. Fosfomycin essentially was the only antibiotic that resolved her infection.   Final Clinical Impressions(s) / UC Diagnoses   Final diagnoses:  Acute cystitis with hematuria     Discharge Instructions      I'm treating you with fosfomycin which is the final medication that cleared your last infection.  Take this one time dose. The medication remains in your system for several days should clear your urinary tract infection. Continue to hydrate well with fluids. If any of your symptoms worsen or become severe go immediately to the ER.      ED Prescriptions     Medication Sig Dispense Auth. Provider   fosfomycin (MONUROL) 3 g PACK Take 3 g by mouth once for 1 dose. 3 g Scot Jun, FNP      PDMP not reviewed this encounter.

## 2021-10-17 NOTE — Discharge Instructions (Addendum)
I'm treating you with fosfomycin which is the final medication that cleared your last infection.  Take this one time dose. The medication remains in your system for several days should clear your urinary tract infection. Continue to hydrate well with fluids. If any of your symptoms worsen or become severe go immediately to the ER.

## 2021-10-21 LAB — URINE CULTURE: Culture: >=100000 — AB

## 2021-11-05 NOTE — Progress Notes (Unsigned)
Established patient visit  I,April Miller,acting as a scribe for Wilhemena Durie, MD.,have documented all relevant documentation on the behalf of Wilhemena Durie, MD,as directed by  Wilhemena Durie, MD while in the presence of Wilhemena Durie, MD.   Patient: Stacey [name]   DOB: 1940/07/06   81 y.o. Female  MRN: [id number] Visit Date: 11/06/2021  Today's healthcare provider: Wilhemena Durie, MD   Chief Complaint  Patient presents with   Skin Discoloration   Subjective    HPI  Patient has redness and swelling in right lower leg. Leg swells more y the end of the day.  Medications: Outpatient Medications Prior to Visit  Medication Sig   acyclovir (ZOVIRAX) 400 MG tablet Take 400 mg by mouth 3 times per day for 5 days to start immediately with each episode of fever blister. May also take one a day for prevention.   albuterol (VENTOLIN HFA) 108 (90 Base) MCG/ACT inhaler INHALE 2 PUFFS BY MOUTH EVERY 4 TO 6 HOURS AS NEEDED FOR SHORTNESS OF BREATH   amLODipine (NORVASC) 5 MG tablet Take 1 tablet (5 mg total) daily by mouth.   apixaban (ELIQUIS) 5 MG TABS tablet Take 1 tablet (5 mg total) by mouth 2 (two) times daily.   ascorbic acid (VITAMIN C) 500 MG tablet Take 2,000 mg by mouth daily.    aspirin 81 MG tablet Take 1 tablet (81 mg total) by mouth daily.   buPROPion (WELLBUTRIN XL) 300 MG 24 hr tablet Take by mouth.   chlorthalidone (HYGROTON) 25 MG tablet TAKE 1/2 TABLET BY MOUTH DAILY.   Cholecalciferol (VITAMIN D) 2000 units tablet Take 2,000 Units by mouth daily.   Cyanocobalamin (B-12) 500 MCG TABS Take 500 mcg by mouth daily.    diazepam (VALIUM) 5 MG tablet TAKE ONE TABLET EVERY 12 HOURS AS NEEDEDFOR ANXIETY   ELDERBERRY PO Take by mouth daily. Unsure dose / combination vitamin   EPINEPHrine (EPIPEN 2-PAK) 0.3 mg/0.3 mL IJ SOAJ injection INJECT AS DIRECTED FOR SEVERE ALLERGIC REACTION (Patient taking differently: Inject 0.3 mg into the muscle as needed  (for severe allergic reaction).)   fluocinonide gel (LIDEX) 6.38 % Apply 1 application topically as needed.   isosorbide mononitrate (IMDUR) 30 MG 24 hr tablet Take 30 mg by mouth daily.   levothyroxine (SYNTHROID) 100 MCG tablet TAKE 1 TABLET BY MOUTH DAILY   metoprolol tartrate (LOPRESSOR) 25 MG tablet One and one half tabs twice daily   mometasone (ELOCON) 0.1 % cream Apply 1 application topically daily as needed (Rash).   Multiple Vitamin (MULTI-VITAMINS) TABS Take 1 tablet by mouth daily.   oxymetazoline (AFRIN) 0.05 % nasal spray Place 1 spray into both nostrils as needed for congestion.   rosuvastatin (CRESTOR) 20 MG tablet TAKE ONE TABLET (20 MG) BY MOUTH EVERY EVENING   sertraline (ZOLOFT) 25 MG tablet Take by mouth.   tacrolimus (PROTOPIC) 0.1 % ointment As needed for rosacea flare ups   zinc gluconate 50 MG tablet Take 50 mg by mouth daily.    zolpidem (AMBIEN) 5 MG tablet TAKE ONE TABLET BY MOUTH AT BEDTIME AS NEEDED FOR SLEEP   [DISCONTINUED] fluconazole (DIFLUCAN) 200 MG tablet Take 1 tablet on Monday and take 1 tablet on Thursday, repeat for 4 weeks (Patient not taking: Reported on 08/14/2021)   [DISCONTINUED] nitrofurantoin, macrocrystal-monohydrate, (MACROBID) 100 MG capsule Take 1 capsule (100 mg total) by mouth 2 (two) times daily. (Patient not taking: Reported on 08/14/2021)   [  DISCONTINUED] nystatin (MYCOSTATIN) 100000 UNIT/ML suspension Take 100,000 Units by mouth 3 (three) times daily as needed. For mouth ulcers. (Patient not taking: Reported on 08/14/2021)   No facility-administered medications prior to visit.    Review of Systems  Constitutional:  Negative for appetite change, chills, fatigue and fever.  Respiratory:  Negative for chest tightness and shortness of breath.   Cardiovascular:  Negative for chest pain and palpitations.  Gastrointestinal:  Negative for abdominal pain, nausea and vomiting.  Neurological:  Negative for dizziness and weakness.    Last  lipids Lab Results  Component Value Date   CHOL 151 05/02/2021   HDL 55 05/02/2021   LDLCALC 71 05/02/2021   TRIG 147 05/02/2021   CHOLHDL 2.7 05/02/2021       Objective    BP 123/72 (BP Location: Left Arm, Patient Position: Sitting, Cuff Size: Large)   Pulse 70   Resp 16   Wt 187 lb (84.8 kg)   SpO2 96%   BMI 34.76 kg/m  BP Readings from Last 3 Encounters:  11/06/21 123/72  10/17/21 (!) 149/83  08/14/21 (!) 142/80   Wt Readings from Last 3 Encounters:  11/06/21 187 lb (84.8 kg)  08/14/21 192 lb 3.2 oz (87.2 kg)  08/11/21 185 lb (83.9 kg)      Physical Exam  ***  No results found for any visits on 11/06/21.  Assessment & Plan     ***  No follow-ups on file.      {provider attestation***:1}   Wilhemena Durie, MD  [facility] [phone] (phone) [phone] (fax)  [place]

## 2021-11-06 ENCOUNTER — Ambulatory Visit (INDEPENDENT_AMBULATORY_CARE_PROVIDER_SITE_OTHER): Payer: HMO | Admitting: Family Medicine

## 2021-11-06 ENCOUNTER — Encounter: Payer: Self-pay | Admitting: Family Medicine

## 2021-11-06 VITALS — BP 123/72 | HR 70 | Resp 16 | Wt 187.0 lb

## 2021-11-06 DIAGNOSIS — I890 Lymphedema, not elsewhere classified: Secondary | ICD-10-CM

## 2021-11-06 DIAGNOSIS — I25810 Atherosclerosis of coronary artery bypass graft(s) without angina pectoris: Secondary | ICD-10-CM

## 2021-11-06 DIAGNOSIS — E785 Hyperlipidemia, unspecified: Secondary | ICD-10-CM

## 2021-11-06 DIAGNOSIS — I10 Essential (primary) hypertension: Secondary | ICD-10-CM

## 2021-11-06 DIAGNOSIS — G4733 Obstructive sleep apnea (adult) (pediatric): Secondary | ICD-10-CM | POA: Diagnosis not present

## 2021-11-06 DIAGNOSIS — L03115 Cellulitis of right lower limb: Secondary | ICD-10-CM | POA: Diagnosis not present

## 2021-11-06 MED ORDER — SULFAMETHOXAZOLE-TRIMETHOPRIM 800-160 MG PO TABS: 1.0000 | ORAL_TABLET | Freq: Two times a day (BID) | ORAL | 0 refills | Status: DC

## 2021-11-19 ENCOUNTER — Other Ambulatory Visit: Payer: Self-pay | Admitting: Family Medicine

## 2021-11-19 DIAGNOSIS — F5101 Primary insomnia: Secondary | ICD-10-CM

## 2021-11-27 DIAGNOSIS — M25511 Pain in right shoulder: Secondary | ICD-10-CM | POA: Diagnosis not present

## 2021-12-04 ENCOUNTER — Other Ambulatory Visit: Payer: Self-pay | Admitting: Family Medicine

## 2021-12-04 DIAGNOSIS — E785 Hyperlipidemia, unspecified: Secondary | ICD-10-CM

## 2021-12-20 ENCOUNTER — Other Ambulatory Visit: Payer: Self-pay | Admitting: Physician Assistant

## 2021-12-20 ENCOUNTER — Other Ambulatory Visit: Payer: Self-pay | Admitting: Family Medicine

## 2021-12-22 NOTE — Telephone Encounter (Signed)
Requested medications are due for refill today.  Unsure  Requested medications are on the active medications list.  yes  Last refill. 06/15/2020  Future visit scheduled.   yes  Notes to clinic.  Medication listed as historical.    Requested Prescriptions  Pending Prescriptions Disp Refills   buPROPion (WELLBUTRIN XL) 300 MG 24 hr tablet [Pharmacy Med Name: BUPROPION HCL ER (XL) 300 MG TAB] 90 tablet     Sig: TAKE ONE TABLET BY MOUTH EVERY DAY     Psychiatry: Antidepressants - bupropion Failed - 12/20/2021  8:26 AM      Failed - Cr in normal range and within 360 days    Creat  Date Value Ref Range Status  04/29/2017 0.93 0.60 - 0.93 mg/dL Final    Comment:    For patients >69 years of age, the reference limit for Creatinine is approximately 13% higher for people identified as African-American. .    Creatinine, Ser  Date Value Ref Range Status  05/02/2021 1.09 (H) 0.57 - 1.00 mg/dL Final         Passed - AST in normal range and within 360 days    AST  Date Value Ref Range Status  05/02/2021 26 0 - 40 IU/L Final         Passed - ALT in normal range and within 360 days    ALT  Date Value Ref Range Status  05/02/2021 20 0 - 32 IU/L Final         Passed - Completed PHQ-2 or PHQ-9 in the last 360 days      Passed - Last BP in normal range    BP Readings from Last 1 Encounters:  11/06/21 123/72         Passed - Valid encounter within last 6 months    Recent Outpatient Visits           1 month ago Cellulitis of right lower extremity   [facility] [name], MD   6 months ago Acute cystitis with hematuria   [place], Dani Gobble, PA-C   7 months ago Essential hypertension   [facility] Mikey Kirschner, PA-C   1 year ago Cellulitis of right lower extremity   [facility] Fenton Malling M, [place]   1 year ago Cellulitis of right lower extremity   [facility] [name], MD       Future Appointments             In 3 weeks Ralene Bathe, MD [place]   In 4 months [name], MD [facility], [place]

## 2021-12-27 ENCOUNTER — Other Ambulatory Visit: Payer: Self-pay | Admitting: Family Medicine

## 2022-01-05 ENCOUNTER — Ambulatory Visit: Payer: Self-pay | Admitting: *Deleted

## 2022-01-05 DIAGNOSIS — G4733 Obstructive sleep apnea (adult) (pediatric): Secondary | ICD-10-CM | POA: Diagnosis not present

## 2022-01-05 DIAGNOSIS — I25810 Atherosclerosis of coronary artery bypass graft(s) without angina pectoris: Secondary | ICD-10-CM | POA: Diagnosis not present

## 2022-01-05 DIAGNOSIS — E782 Mixed hyperlipidemia: Secondary | ICD-10-CM | POA: Diagnosis not present

## 2022-01-05 DIAGNOSIS — I4819 Other persistent atrial fibrillation: Secondary | ICD-10-CM | POA: Diagnosis not present

## 2022-01-05 DIAGNOSIS — I10 Essential (primary) hypertension: Secondary | ICD-10-CM | POA: Diagnosis not present

## 2022-01-05 NOTE — Patient Outreach (Signed)
  Care Coordination   01/05/2022 Name: Stacey [name] MRN: [id number] DOB: September 05, 1940   Care Coordination Outreach Attempts:  An unsuccessful telephone outreach was attempted today to offer the patient information about available care coordination services as a benefit of their health plan.   Follow Up Plan:  Additional outreach attempts will be made to offer the patient care coordination information and services.   Encounter Outcome:  No Answer  Care Coordination Interventions Activated:  No   Care Coordination Interventions:  No, not indicated     Stacey [name], [place] Worker  [facility] Care Management [phone]

## 2022-01-08 DIAGNOSIS — M19011 Primary osteoarthritis, right shoulder: Secondary | ICD-10-CM | POA: Diagnosis not present

## 2022-01-09 ENCOUNTER — Other Ambulatory Visit (HOSPITAL_COMMUNITY): Payer: Self-pay | Admitting: Orthopaedic Surgery

## 2022-01-09 ENCOUNTER — Other Ambulatory Visit: Payer: Self-pay | Admitting: Orthopaedic Surgery

## 2022-01-09 DIAGNOSIS — M19019 Primary osteoarthritis, unspecified shoulder: Secondary | ICD-10-CM

## 2022-01-15 ENCOUNTER — Ambulatory Visit: Payer: PPO | Admitting: Dermatology

## 2022-01-15 DIAGNOSIS — D1800 Hemangioma unspecified site: Secondary | ICD-10-CM

## 2022-01-15 DIAGNOSIS — Z1283 Encounter for screening for malignant neoplasm of skin: Secondary | ICD-10-CM | POA: Diagnosis not present

## 2022-01-15 DIAGNOSIS — L821 Other seborrheic keratosis: Secondary | ICD-10-CM | POA: Diagnosis not present

## 2022-01-15 DIAGNOSIS — L820 Inflamed seborrheic keratosis: Secondary | ICD-10-CM

## 2022-01-15 DIAGNOSIS — D229 Melanocytic nevi, unspecified: Secondary | ICD-10-CM

## 2022-01-15 DIAGNOSIS — L814 Other melanin hyperpigmentation: Secondary | ICD-10-CM

## 2022-01-15 DIAGNOSIS — L578 Other skin changes due to chronic exposure to nonionizing radiation: Secondary | ICD-10-CM

## 2022-01-15 DIAGNOSIS — K130 Diseases of lips: Secondary | ICD-10-CM | POA: Diagnosis not present

## 2022-01-15 DIAGNOSIS — Z85828 Personal history of other malignant neoplasm of skin: Secondary | ICD-10-CM

## 2022-01-15 DIAGNOSIS — L720 Epidermal cyst: Secondary | ICD-10-CM | POA: Diagnosis not present

## 2022-01-15 MED ORDER — TACROLIMUS 0.1 % EX OINT: TOPICAL_OINTMENT | CUTANEOUS | 1 refills | Status: AC

## 2022-01-15 NOTE — Patient Instructions (Addendum)
Can use urea 40 % cream can use 3 times weekly for dryness and scale at feet. Can be found online.  Amlactin lotion can be purchased over the counter can be used daily apply to feet for scaliness   Seborrheic Keratosis  What causes seborrheic keratoses? Seborrheic keratoses are harmless, common skin growths that first appear during adult life.  As time goes by, more growths appear.  Some people may develop a large number of them.  Seborrheic keratoses appear on both covered and uncovered body parts.  They are not caused by sunlight.  The tendency to develop seborrheic keratoses can be inherited.  They vary in color from skin-colored to gray, brown, or even black.  They can be either smooth or have a rough, warty surface.   Seborrheic keratoses are superficial and look as if they were stuck on the skin.  Under the microscope this type of keratosis looks like layers upon layers of skin.  That is why at times the top layer may seem to fall off, but the rest of the growth remains and re-grows.    Treatment Seborrheic keratoses do not need to be treated, but can easily be removed in the office.  Seborrheic keratoses often cause symptoms when they rub on clothing or jewelry.  Lesions can be in the way of shaving.  If they become inflamed, they can cause itching, soreness, or burning.  Removal of a seborrheic keratosis can be accomplished by freezing, burning, or surgery. If any spot bleeds, scabs, or grows rapidly, please return to have it checked, as these can be an indication of a skin cancer.  Cryotherapy Aftercare  Wash gently with soap and water everyday.   Apply Vaseline and Band-Aid daily until healed.     For Cyst removal at left calf    Pre-Operative Instructions  You are scheduled for a surgical procedure at [facility]. We recommend you read the following instructions. If you have any questions or concerns, please call the office at [phone].  Shower and wash the entire  body with soap and water the day of your surgery paying special attention to cleansing at and around the planned surgery site.  Avoid aspirin or aspirin containing products at least fourteen (14) days prior to your surgical procedure and for at least one week (7 Days) after your surgical procedure. If you take aspirin on a regular basis for heart disease or history of stroke or for any other reason, we may recommend you continue taking aspirin but please notify us if you take this on a regular basis. Aspirin can cause more bleeding to occur during surgery as well as prolonged bleeding and bruising after surgery.   Avoid other nonsteroidal pain medications at least one week prior to surgery and at least one week prior to your surgery. These include medications such as Ibuprofen (Motrin, Advil and Nuprin), Naprosyn, Voltaren, Relafen, etc. If medications are used for therapeutic reasons, please inform us as they can cause increased bleeding or prolonged bleeding during and bruising after surgical procedures.   Please advise [place] if you are taking any "blood thinner" medications such as Coumadin or Dipyridamole or Plavix or similar medications. These cause increased bleeding and prolonged bleeding during procedures and bruising after surgical procedures. We may have to consider discontinuing these medications briefly prior to and shortly after your surgery if safe to do so.   Please inform us of all medications you are currently taking. All medications that are taken regularly should be  taken the day of surgery as you always do. Nevertheless, we need to be informed of what medications you are taking prior to surgery to know whether they will affect the procedure or cause any complications.   Please inform us of any medication allergies. Also inform us of whether you have allergies to Latex or rubber products or whether you have had any adverse reaction to Lidocaine or Epinephrine.  Please inform us of any  prosthetic or artificial body parts such as artificial heart valve, joint replacements, etc., or similar condition that might require preoperative antibiotics.   We recommend avoidance of alcohol at least two weeks prior to surgery and continued avoidance for at least two weeks after surgery.   We recommend discontinuation of tobacco smoking at least two weeks prior to surgery and continued abstinence for at least two weeks after surgery.  Do not plan strenuous exercise, strenuous work or strenuous lifting for approximately four weeks after your surgery.   We request if you are unable to make your scheduled surgical appointment, please call us at least a week in advance or as soon as you are aware of a problem so that we can cancel or reschedule the appointment.   You MAY TAKE TYLENOL (acetaminophen) for pain as it is not a blood thinner.   PLEASE PLAN TO BE IN TOWN FOR TWO WEEKS FOLLOWING SURGERY, THIS IS IMPORTANT SO YOU CAN BE CHECKED FOR DRESSING CHANGES, SUTURE REMOVAL AND TO MONITOR FOR POSSIBLE COMPLICATIONS.     Melanoma ABCDEs  Melanoma is the most dangerous type of skin cancer, and is the leading cause of death from skin disease.  You are more likely to develop melanoma if you: Have light-colored skin, light-colored eyes, or red or blond hair Spend a lot of time in the sun Tan regularly, either outdoors or in a tanning bed Have had blistering sunburns, especially during childhood Have a close family member who has had a melanoma Have atypical moles or large birthmarks  Early detection of melanoma is key since treatment is typically straightforward and cure rates are extremely high if we catch it early.   The first sign of melanoma is often a change in a mole or a new dark spot.  The ABCDE system is a way of remembering the signs of melanoma.  A for asymmetry:  The two halves do not match. B for border:  The edges of the growth are irregular. C for color:  A mixture of colors  are present instead of an even brown color. D for diameter:  Melanomas are usually (but not always) greater than 79m - the size of a pencil eraser. E for evolution:  The spot keeps changing in size, shape, and color.  Please check your skin once per month between visits. You can use a small mirror in front and a large mirror behind you to keep an eye on the back side or your body.   If you see any new or changing lesions before your next follow-up, please call to schedule a visit.  Please continue daily skin protection including broad spectrum sunscreen SPF 30+ to sun-exposed areas, reapplying every 2 hours as needed when you're outdoors.   Staying in the shade or wearing long sleeves, sun glasses (UVA+UVB protection) and wide brim hats (4-inch brim around the entire circumference of the hat) are also recommended for sun protection.     Due to recent changes in healthcare laws, you may see results of your pathology and/or laboratory  studies on MyChart before the doctors have had a chance to review them. We understand that in some cases there may be results that are confusing or concerning to you. Please understand that not all results are received at the same time and often the doctors may need to interpret multiple results in order to provide you with the best plan of care or course of treatment. Therefore, we ask that you please give [place] 2 business days to thoroughly review all your results before contacting the office for clarification. Should we see a critical lab result, you will be contacted sooner.   If You Need Anything After Your Visit  If you have any questions or concerns for your doctor, please call our main line at [phone] and press option 4 to reach your doctor's medical assistant. If no one answers, please leave a voicemail as directed and we will return your call as soon as possible. Messages left after 4 pm will be answered the following business day.   You may also send [place] a  message via [place]. We typically respond to MyChart messages within 1-2 business days.  For prescription refills, please ask your pharmacy to contact our office. Our fax number is [phone].  If you have an urgent issue when the clinic is closed that cannot wait until the next business day, you can page your doctor at the number below.    Please note that while we do our best to be available for urgent issues outside of office hours, we are not available 24/7.   If you have an urgent issue and are unable to reach [place], you may choose to seek medical care at your doctor's office, retail clinic, urgent care center, or emergency room.  If you have a medical emergency, please immediately call 911 or go to the emergency department.  Pager Numbers  - Dr. [name]: [phone]  - Dr. Laurence Ferrari: [phone]  - Dr. Nicole Kindred: [phone]  In the event of inclement weather, please call our main line at [phone] for an update on the status of any delays or closures.  Dermatology Medication Tips: Please keep the boxes that topical medications come in in order to help keep track of the instructions about where and how to use these. Pharmacies typically print the medication instructions only on the boxes and not directly on the medication tubes.   If your medication is too expensive, please contact our office at [phone] option 4 or send [place] a message through [place].   We are unable to tell what your co-pay for medications will be in advance as this is different depending on your insurance coverage. However, we may be able to find a substitute medication at lower cost or fill out paperwork to get insurance to cover a needed medication.   If a prior authorization is required to get your medication covered by your insurance company, please allow [place] 1-2 business days to complete this process.  Drug prices often vary depending on where the prescription is filled and some pharmacies may offer  cheaper prices.  The website www.goodrx.com contains coupons for medications through different pharmacies. The prices here do not account for what the cost may be with help from insurance (it may be cheaper with your insurance), but the website can give you the price if you did not use any insurance.  - You can print the associated coupon and take it with your prescription to the pharmacy.  - You may also stop by our office during  regular business hours and pick up a GoodRx coupon card.  - If you need your prescription sent electronically to a different pharmacy, notify our office through [facility] or by phone at [phone] option 4.     Si Usted Necesita Algo Despus de Su Visita  Tambin puede enviarnos un mensaje a travs de Pharmacist, community. Por lo general respondemos a los mensajes de MyChart en el transcurso de 1 a 2 das hbiles.  Para renovar recetas, por favor pida a su farmacia que se ponga en contacto con nuestra oficina. Harland Dingwall de fax es [place] [phone].  Si tiene un asunto urgente cuando la clnica est cerrada y que no puede esperar hasta el siguiente da hbil, puede llamar/localizar a su doctor(a) al nmero que aparece a continuacin.   Por favor, tenga en cuenta que aunque hacemos todo lo posible para estar disponibles para asuntos urgentes fuera del horario de [place], no estamos disponibles las 24 horas del da, los 7 das de la [place].   Si tiene un problema urgente y no puede comunicarse con nosotros, puede optar por buscar atencin mdica  en el consultorio de su doctor(a), en una clnica privada, en un centro de atencin urgente o en una sala de emergencias.  Si tiene Engineering geologist, por favor llame inmediatamente al 911 o vaya a la sala de emergencias.  Nmeros de bper  - Dr. [name]: [phone]  - Dra. Moye: [phone]  - Dra. Nicole Kindred: [phone]  En caso de inclemencias del [place], por favor llame a Johnsie Kindred principal al  [phone] para una actualizacin sobre el [place] de cualquier retraso o cierre.  Consejos para la medicacin en dermatologa: Por favor, guarde las cajas en las que vienen los medicamentos de uso tpico para ayudarle a seguir las instrucciones sobre dnde y cmo usarlos. Las farmacias generalmente imprimen las instrucciones del medicamento slo en las cajas y no directamente en los tubos del [place].   Si su medicamento es muy caro, por favor, pngase en contacto con Zigmund Daniel llamando al [phone] y presione la opcin 4 o envenos un mensaje a travs de Pharmacist, community.   No podemos decirle cul ser su copago por los medicamentos por adelantado ya que esto es diferente dependiendo de la cobertura de su seguro. Sin embargo, es posible que podamos encontrar un medicamento sustituto a Electrical engineer un formulario para que el seguro cubra el medicamento que se considera necesario.   Si se requiere una autorizacin previa para que su compaa de seguros [place] su medicamento, por favor permtanos de 1 a 2 das hbiles para completar este proceso.  Los precios de los medicamentos varan con frecuencia dependiendo del Environmental consultant de dnde se surte la receta y alguna farmacias pueden ofrecer precios ms baratos.  El sitio web www.goodrx.com tiene cupones para medicamentos de Airline pilot. Los precios aqu no tienen en cuenta lo que podra costar con la ayuda del seguro (puede ser ms barato con su seguro), pero el sitio web puede darle el precio si no utiliz Research scientist (physical sciences).  - Puede imprimir el cupn correspondiente y llevarlo con su receta a la farmacia.  - Tambin puede pasar por nuestra oficina durante el horario de atencin regular y Charity fundraiser una tarjeta de cupones de GoodRx.  - Si necesita que su receta se enve electrnicamente a una farmacia diferente, informe a nuestra oficina a travs de MyChart de [place] o por telfono llamando al [phone] y presione la opcin 4.

## 2022-01-15 NOTE — Progress Notes (Signed)
Follow-Up Visit   Subjective  Stacey [name] is a 81 y.o. female who presents for the following: Annual Exam (Knot on back of left leg, bruising or dark spots at face/Tbse hx of aks, intertrigol). The patient presents for Total-Body Skin Exam (TBSE) for skin cancer screening and mole check.  The patient has spots, moles and lesions to be evaluated, some may be new or changing and the patient has concerns that these could be cancer.  The following portions of the chart were reviewed this encounter and updated as appropriate:  Tobacco  Allergies  Meds  Problems  Med Hx  Surg Hx  Fam Hx     Review of Systems: No other skin or systemic complaints except as noted in HPI or Assessment and Plan.  Objective  Well appearing patient in no apparent distress; mood and affect are within normal limits.  A full examination was performed including scalp, head, eyes, ears, nose, lips, neck, chest, axillae, abdomen, back, buttocks, bilateral upper extremities, bilateral lower extremities, hands, feet, fingers, toes, fingernails, and toenails. All findings within normal limits unless otherwise noted below.  face x 17 (17) Erythematous stuck-on, waxy papule or plaque  left calf 1.5 cm subcutaneous nodule.    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 Condition is symptomatic / bothersome to patient. Not to goal.  Start tacrolimus 0.1 % ointment- apply to aa of lips qd/bid prn for dryness.   Recheck in 3 months   tacrolimus (PROTOPIC) 0.1 % ointment - b/l lips Apply qd to bid qd prn to lips for dryness  Inflamed seborrheic keratosis (17) face x 17 Symptomatic, irritating, patient would like treated. Destruction of lesion - face x 17 Complexity: simple   Destruction method: cryotherapy   Informed consent: discussed and consent obtained   Timeout:  patient name, date of birth, surgical site, and procedure  verified Lesion destroyed using liquid nitrogen: Yes   Region frozen until ice ball extended beyond lesion: Yes   Outcome: patient tolerated procedure well with no complications   Post-procedure details: wound care instructions given   Additional details:  Prior to procedure, discussed risks of blister formation, small wound, skin dyspigmentation, or rare scar following cryotherapy. Recommend Vaseline ointment to treated areas while healing.  Epidermal inclusion cyst left calf Benign-appearing. Exam most consistent with an epidermal inclusion cyst. Discussed that a cyst is a benign growth that can grow over time and sometimes get irritated or inflamed. Recommend observation if it is not bothersome. Discussed option of surgical excision to remove it if it is growing, symptomatic, or other changes noted. Please call for new or changing lesions so they can be evaluated.  Patient would like removed   Discussed surgical excision to remove, including resulting scar and possible recurrence.  Patient will schedule for surgery. Pre-op information given.  Will scheduled surgery in January 2024  Lentigines - Scattered tan macules - Due to sun exposure - Benign-appearing, observe - Recommend daily broad spectrum sunscreen SPF 30+ to sun-exposed areas, reapply every 2 hours as needed. - Call for any changes  Seborrheic Keratoses - Stuck-on, waxy, tan-brown papules and/or plaques  - Benign-appearing - Discussed benign etiology and prognosis. - Observe - Call for any changes  Melanocytic Nevi - Tan-brown and/or pink-flesh-colored symmetric macules and papules - Benign appearing on exam today - Observation - Call clinic for new or changing moles - Recommend daily use of broad spectrum spf  30+ sunscreen to sun-exposed areas.   Hemangiomas - Red papules - Discussed benign nature - Observe - Call for any changes  Actinic Damage - Chronic condition, secondary to cumulative UV/sun exposure -  diffuse scaly erythematous macules with underlying dyspigmentation - Recommend daily broad spectrum sunscreen SPF 30+ to sun-exposed areas, reapply every 2 hours as needed.  - Staying in the shade or wearing long sleeves, sun glasses (UVA+UVB protection) and wide brim hats (4-inch brim around the entire circumference of the hat) are also recommended for sun protection.  - Call for new or changing lesions.  History of Squamous Cell Carcinoma of the Skin  L upper lip vermillion border extending onto vermillion adjacent to previous SCC scar - No evidence of recurrence today - No lymphadenopathy - Recommend regular full body skin exams - Recommend daily broad spectrum sunscreen SPF 30+ to sun-exposed areas, reapply every 2 hours as needed.  - Call if any new or changing lesions are noted between office visits  Skin cancer screening performed today.  Return for 3 month cheilitis and isk  follow up, schedule cyst removal at left calf in January, 1 year tbse. IRuthell Rummage, CMA, am acting as scribe for Sarina Ser, MD. Documentation: I have reviewed the above documentation for accuracy and completeness, and I agree with the above.  Sarina Ser, MD

## 2022-01-16 ENCOUNTER — Ambulatory Visit
Admission: RE | Admit: 2022-01-16 | Discharge: 2022-01-16 | Disposition: A | Discharge status: Home / Self Care | Payer: PPO | Source: Ambulatory Visit | Attending: Orthopaedic Surgery | Admitting: Orthopaedic Surgery

## 2022-01-16 DIAGNOSIS — M19019 Primary osteoarthritis, unspecified shoulder: Secondary | ICD-10-CM

## 2022-01-16 DIAGNOSIS — M19011 Primary osteoarthritis, right shoulder: Secondary | ICD-10-CM | POA: Diagnosis not present

## 2022-01-17 ENCOUNTER — Encounter: Payer: Self-pay | Admitting: Dermatology

## 2022-02-04 ENCOUNTER — Other Ambulatory Visit: Payer: Self-pay | Admitting: Family Medicine

## 2022-02-05 DIAGNOSIS — I10 Essential (primary) hypertension: Secondary | ICD-10-CM | POA: Diagnosis not present

## 2022-02-05 DIAGNOSIS — N1831 Chronic kidney disease, stage 3a: Secondary | ICD-10-CM | POA: Diagnosis not present

## 2022-02-05 NOTE — Telephone Encounter (Signed)
Requested medication (s) are due for refill today: yes  Requested medication (s) are on the active medication list: yes  Last refill:  09/01/21  Future visit scheduled: yes  Notes to clinic:  Unable to refill per protocol, cannot delegate.      Requested Prescriptions  Pending Prescriptions Disp Refills   diazepam (VALIUM) 5 MG tablet [Pharmacy Med Name: DIAZEPAM 5 MG TAB] 20 tablet     Sig: TAKE ONE TABLET EVERY 12 HOURS AS NEEDEDFOR ANXIETY     Not Delegated - Psychiatry: Anxiolytics/Hypnotics 2 Failed - 02/04/2022 10:17 AM      Failed - This refill cannot be delegated      Failed - Urine Drug Screen completed in last 360 days      Passed - Patient is not pregnant      Passed - Valid encounter within last 6 months    Recent Outpatient Visits           3 months ago Cellulitis of right lower extremity   [place] [name], MD   8 months ago Acute cystitis with hematuria   [place], Dani Gobble, PA-C   9 months ago Essential hypertension   [facility] Mikey Kirschner, PA-C   1 year ago Cellulitis of right lower extremity   [facility] Fenton Malling M, [place]   1 year ago Cellulitis of right lower extremity   [facility] [name], MD       Future Appointments             In 3 months [name], MD [facility], [place]   In 3 months Ralene Bathe, MD [place]

## 2022-02-12 DIAGNOSIS — N1831 Chronic kidney disease, stage 3a: Secondary | ICD-10-CM | POA: Diagnosis not present

## 2022-02-24 ENCOUNTER — Telehealth: Payer: Self-pay

## 2022-02-24 NOTE — Telephone Encounter (Signed)
Copied from [place] [id number]. Topic: General - Inquiry >> Feb 24, 2022 11:23 AM Erskine Squibb wrote: Reason for CRM: The patients spouse, [name] called in to inform her provider that she is having a total shoulder replacement on the right on November 2. Please assist patient further if necessary

## 2022-02-24 NOTE — Telephone Encounter (Signed)
Called Stacey [name] back to double check if they need a clearance for surgery. States no that cardiologist took care of that and I just an [place]

## 2022-03-11 DIAGNOSIS — M25511 Pain in right shoulder: Secondary | ICD-10-CM | POA: Diagnosis not present

## 2022-03-11 DIAGNOSIS — Z01818 Encounter for other preprocedural examination: Secondary | ICD-10-CM | POA: Diagnosis not present

## 2022-03-13 ENCOUNTER — Other Ambulatory Visit: Payer: Self-pay

## 2022-03-13 ENCOUNTER — Encounter
Admission: RE | Admit: 2022-03-13 | Discharge: 2022-03-13 | Disposition: A | Discharge status: Home / Self Care | Payer: PPO | Source: Ambulatory Visit | Attending: Orthopaedic Surgery | Admitting: Orthopaedic Surgery

## 2022-03-13 VITALS — BP 123/92 | HR 77 | Resp 16 | Ht 61.5 in | Wt 193.0 lb

## 2022-03-13 DIAGNOSIS — E870 Hyperosmolality and hypernatremia: Secondary | ICD-10-CM | POA: Insufficient documentation

## 2022-03-13 DIAGNOSIS — Z01818 Encounter for other preprocedural examination: Secondary | ICD-10-CM | POA: Insufficient documentation

## 2022-03-13 DIAGNOSIS — Z01810 Encounter for preprocedural cardiovascular examination: Secondary | ICD-10-CM | POA: Diagnosis not present

## 2022-03-13 DIAGNOSIS — I25810 Atherosclerosis of coronary artery bypass graft(s) without angina pectoris: Secondary | ICD-10-CM | POA: Diagnosis not present

## 2022-03-13 DIAGNOSIS — R079 Chest pain, unspecified: Secondary | ICD-10-CM | POA: Diagnosis not present

## 2022-03-13 DIAGNOSIS — I252 Old myocardial infarction: Secondary | ICD-10-CM | POA: Diagnosis not present

## 2022-03-13 DIAGNOSIS — Z951 Presence of aortocoronary bypass graft: Secondary | ICD-10-CM

## 2022-03-13 DIAGNOSIS — G4733 Obstructive sleep apnea (adult) (pediatric): Secondary | ICD-10-CM | POA: Insufficient documentation

## 2022-03-13 DIAGNOSIS — I214 Non-ST elevation (NSTEMI) myocardial infarction: Secondary | ICD-10-CM

## 2022-03-13 HISTORY — DX: Sleep apnea, unspecified: G47.30

## 2022-03-13 HISTORY — DX: Hypothyroidism, unspecified: E03.9

## 2022-03-13 HISTORY — DX: Dyspnea, unspecified: R06.00

## 2022-03-13 HISTORY — DX: Acute myocardial infarction, unspecified: I21.9

## 2022-03-13 HISTORY — DX: Chronic kidney disease, stage 3 unspecified: N18.30

## 2022-03-13 LAB — CBC
HCT: 42.7 % (ref 36.0–46.0)
Hemoglobin: 13.8 g/dL (ref 12.0–15.0)
MCH: 28.9 pg (ref 26.0–34.0)
MCHC: 32.3 g/dL (ref 30.0–36.0)
MCV: 89.5 fL (ref 80.0–100.0)
Platelets: 183 10*3/uL (ref 150–400)
RBC: 4.77 MIL/uL (ref 3.87–5.11)
RDW: 12.6 % (ref 11.5–15.5)
WBC: 6.2 10*3/uL (ref 4.0–10.5)
nRBC: 0 % (ref 0.0–0.2)

## 2022-03-13 LAB — BASIC METABOLIC PANEL
Anion gap: 10 (ref 5–15)
BUN: 17 mg/dL (ref 8–23)
CO2: 30 mmol/L (ref 22–32)
Calcium: 9.4 mg/dL (ref 8.9–10.3)
Chloride: 95 mmol/L — ABNORMAL LOW (ref 98–111)
Creatinine, Ser: 0.97 mg/dL (ref 0.44–1.00)
GFR, Estimated: 59 mL/min — ABNORMAL LOW (ref >60)
Glucose, Bld: 122 mg/dL — ABNORMAL HIGH (ref 70–99)
Potassium: 3.6 mmol/L (ref 3.5–5.1)
Sodium: 135 mmol/L (ref 135–145)

## 2022-03-13 LAB — TYPE AND SCREEN
ABO/RH(D): A POS
Antibody Screen: NEGATIVE

## 2022-03-13 LAB — URINALYSIS, ROUTINE W REFLEX MICROSCOPIC
Bacteria, UA: NONE SEEN
Bilirubin Urine: NEGATIVE
Glucose, UA: NEGATIVE mg/dL
Hgb urine dipstick: NEGATIVE
Ketones, ur: NEGATIVE mg/dL
Nitrite: NEGATIVE
Protein, ur: NEGATIVE mg/dL
Specific Gravity, Urine: 1.014 (ref 1.005–1.030)
pH: 7 (ref 5.0–8.0)

## 2022-03-13 LAB — SURGICAL PCR SCREEN
MRSA, PCR: NEGATIVE
Staphylococcus aureus: NEGATIVE

## 2022-03-13 NOTE — Patient Instructions (Addendum)
Your procedure is scheduled on: 03/26/22 Report to [facility]. To find out your arrival time please call [phone] between 1PM - 3PM on 03/25/22.  Remember: Instructions that are not followed completely may result in serious medical risk, up to and including death, or upon the discretion of your surgeon and anesthesiologist your surgery may need to be rescheduled.     _X__ 1. Do not eat food after midnight the night before your procedure.                 No gum chewing or hard candies. You may drink clear liquids up to 2 hours                 before you are scheduled to arrive for your surgery- DO not drink clear                 liquids within 2 hours of the start of your surgery.                 Clear Liquids include:  water, apple juice without pulp, clear carbohydrate                 drink such as Clearfast or Gatorade, Black Coffee or Tea (Do not add                 anything to coffee or tea). Diabetics water only  Finish the Ensure pre surgery drink 2 hours before arriving for your surgery  __X__2.  On the morning of surgery brush your teeth with toothpaste and water, you                 may rinse your mouth with mouthwash if you wish.  Do not swallow any              toothpaste of mouthwash.     _X__ 3.  No Alcohol for 24 hours before or after surgery.   _X__ 4.  Do Not Smoke or use e-cigarettes For 24 Hours Prior to Your Surgery.                 Do not use any chewable tobacco products for at least 6 hours prior to                 surgery.  ____  5.  Bring all medications with you on the day of surgery if instructed.   __X__  6.  Notify your doctor if there is any change in your medical condition      (cold, fever, infections).     Do not wear jewelry, make-up, hairpins, clips or nail polish. Do not wear lotions, powders, or perfumes. NO DEODORANT Do not shave body hair 48 hours prior to surgery. Men may shave face and  neck. Do not bring valuables to the hospital.    [facility] is not responsible for any belongings or valuables.  Contacts, dentures/partials or body piercings may not be worn into surgery. Bring a case for your contacts, glasses or hearing aids, a denture cup will be supplied. Leave your suitcase in the car. After surgery it may be brought to your room. For patients admitted to the hospital, discharge time is determined by your treatment team.   Patients discharged the day of surgery will not be allowed to drive home.   Please read over the following fact sheets that you were given:   MRSA  Information, CHG soap, Incentive Spirometer, Ensure  __X__ Take these medicines the morning of surgery with A SIP OF WATER:    1. buPROPion (WELLBUTRIN XL) 300 MG 24 hr tablet  2. isosorbide mononitrate (IMDUR) 30 MG 24 hr tablet  3. levothyroxine (SYNTHROID) 100 MCG tablet  4. metoprolol tartrate (LOPRESSOR) 50 MG tablet  5. sertraline (ZOLOFT) 25 MG tablet  6. diazepam (VALIUM) 5 MG tablet if needed  ____ Fleet Enema (as directed)   __X__ Use CHG Soap/SAGE wipes as directed  __X__ Use inhalers on the day of surgery  ____ Stop metformin/Janumet/Farxiga 2 days prior to surgery    ____ Take 1/2 of usual insulin dose the night before surgery. No insulin the morning          of surgery.   __x__ Stop Eliquis for 3 days as instructed. Last dose to be on Mon 03/23/22 am  __X__ Stop Anti-inflammatories 7 days before surgery such as Advil, Ibuprofen, Motrin,  BC or Goodies Powder, Naprosyn, Naproxen, Aleve   __X__ Stop all herbals and supplements, fish oil or vitamins for 7 days until after surgery.    ____ Bring C-Pap to the hospital.

## 2022-03-15 LAB — URINE CULTURE

## 2022-03-23 ENCOUNTER — Other Ambulatory Visit: Payer: Self-pay | Admitting: Family Medicine

## 2022-03-23 NOTE — Telephone Encounter (Signed)
[facility] faxed refill request for the following medications:  buPROPion (WELLBUTRIN XL) 300 MG 24 hr tablet   Please advise.

## 2022-03-24 ENCOUNTER — Encounter: Payer: Self-pay | Admitting: Orthopaedic Surgery

## 2022-03-24 ENCOUNTER — Encounter: Payer: Self-pay | Admitting: *Deleted

## 2022-03-24 ENCOUNTER — Telehealth: Payer: Self-pay | Admitting: *Deleted

## 2022-03-24 MED ORDER — BUPROPION HCL ER (XL) 300 MG PO TB24: 300.0000 mg | ORAL_TABLET | Freq: Every day | ORAL | 0 refills | Status: DC

## 2022-03-24 NOTE — Patient Outreach (Signed)
  Care Coordination   Initial Visit Note   03/24/2022 Name: Stacey [name] MRN: [id number] DOB: 27-Jan-1941  Stacey [name] is a 81 y.o. year old female who sees Simmons-Robinson, Riki Sheer, MD for primary care. I spoke with  Stacey [name] by phone today.  What matters to the patients health and wellness today?  Shoulder pain/discomfort, has surgery scheduled for 11/2.    Goals Addressed             This Visit's Progress    Shoulder surgery without complications       Care Coordination Interventions: Evaluation of current treatment plan related to Shoulder surgery and patient's adherence to plan as established by provider Advised patient to contact this RNCM if she has any questions/concerns about cost of medications Provided education to patient re: plan of care post op, including outpatient PT Reviewed scheduled/upcoming provider appointments including Next call with this RNCM Discussed plans with patient for ongoing care management follow up and provided patient with direct contact information for care management team Assessed social determinant of health barriers         SDOH assessments and interventions completed:  Yes  SDOH Interventions Today    Flowsheet Row Most Recent Value  SDOH Interventions   Food Insecurity Interventions Intervention Not Indicated  Housing Interventions Intervention Not Indicated  Transportation Interventions Intervention Not Indicated  Utilities Interventions Intervention Not Indicated        Care Coordination Interventions Activated:  Yes  Care Coordination Interventions:  Yes, provided   Follow up plan: Follow up call scheduled for 11/7    Encounter Outcome:  Pt. Visit Completed   Valente David, RN, MSN, [place] [facility] Care Management Coordinator [phone]

## 2022-03-24 NOTE — Progress Notes (Addendum)
Perioperative Services  Pre-Admission/Anesthesia Testing Clinical Review  Date: 03/24/22  Patient Demographics:  Name: Stacey [name] DOB:   12/03/1940 MRN:   [id number]  Planned Surgical Procedure(s):    Case: [id number] Date/Time: 03/26/22 0847   Procedure: TOTAL SHOULDER ARTHROPLASTY (Right: Shoulder)   Anesthesia type: General   Pre-op diagnosis: M19.019 Primary osteoarthritis, unspecified shoulder   Location: [facility] OR ROOM 09 / [place] ORS FOR ANESTHESIA GROUP   Surgeons: Renee Harder, MD   NOTE: Available PAT nursing documentation and vital signs have been reviewed. Clinical nursing staff has updated patient's PMH/PSHx, current medication list, and drug allergies/intolerances to ensure comprehensive history available to assist in medical decision making as it pertains to the aforementioned surgical procedure and anticipated anesthetic course. Extensive review of available clinical information performed. Reader PMH and PSHx updated with any diagnoses/procedures that  may have been inadvertently omitted during her intake with the pre-admission testing department's nursing staff.  Clinical Discussion:  Stacey [name] is a 81 y.o. female who is submitted for pre-surgical anesthesia review and clearance prior to her undergoing the above procedure. Patient is a Former Smoker (quit 05/1975). Pertinent PMH includes: CAD (s/p CABG), NSTEMI, atrial fibrillation, aortic atherosclerosis, CVA, HTN, HTN, hypothyroidism, CKD-III, GERD (no daily Tx), asthma, DOE, OSAH (requires nocturnal PAP therapy), OA, chronic lower back pain, fibromyalgia, cervical DDD, insomnia (on hypnotic PRN), depression, anxiety (on BZO PRN).  Patient is followed by cardiology [name], MD). She was last seen in the cardiology clinic on 01/05/2022; notes reviewed.  At the time of clinic visit, patient doing well overall from a cardiovascular perspective.  She denied any episodes of chest pain, shortness of breath,  PND, orthopnea, significant peripheral edema, palpitations, change in symptoms, or presyncope/syncope.  Patient complained of musculoskeletal related pain in her shoulder.  Patient with a past medical history significant for cardiovascular diagnoses.  Patient suffered an NSTEMI on 02/20/2017.  Diagnostic LEFT heart catheterization was performed here at [facility] revealing multivessel CAD; 80% ostial ramus intermedius-ramus intermedius, 90% ostial to proximal LAD, 95% mid LAD, and 99% proximal mid RCA.  Given the complexity of her disease, patient was referred urgently for CVTS consult regarding revascularization.  Patient was transferred to [facility].  Patient underwent three-vessel CABG at [facility] on 02/20/2017.  LIMA-LAD, SVG-distal RCA, and SVG-D1 bypass grafts were placed.  Intraoperative TEE revealed normal left ventricular systolic function with EF 55 to 60%.  There was inferoapical and anteroapical hypokinesis with no evidence of LAA thrombus.  Moderate mitral annular calcification with mild associated regurgitation noted.  There was trace tricuspid valve regurgitation.  There was no evidence of a significant transvalvular gradient to suggest stenosis.  Myocardial perfusion imaging study performed on 05/23/2017 revealed a normal left ventricular systolic function with an EF of 60%.  There was ST segment depression noted during stress in the inferior leads (II, III, aVF).  There was no T wave inversion noted.  Large reversible perfusion defect noted in the anteroseptal and apical regions suggestive of LAD territory ischemia.  Study determined to be high risk.  Repeat diagnostic LEFT heart catheterization was performed on 05/24/2017 revealing severe three-vessel CAD; 100% ostial proximal LAD, 80% ostial ramus intermedius-ramus intermedius, and 100% proximal mid RCA.  SVG to distal RCA and LIMA-LAD bypass grafts were patent.  SVG-ostial D1 bypass  graft with minimal luminal irregularities.  The decision was made to defer further intervention opting for medical management.  Last myocardial perfusion imaging study  was performed on 08/07/2021 with normal left ventricular systolic function of 41%.  There were no regional wall motion abnormalities.  Left ventricular cavity size normal.  There was a small fixed perfusion abnormality of moderate intensity present in the anterior myocardial region on stress images consistent with previous infarct and/or scar or breast attenuation without evidence of myocardial ischemia.  Study determined to be indeterminate due to baseline ECG changes (atrial fibrillation).  Most recent TTE was performed on 08/07/2021 revealing a normal left ventricular systolic function with mild LVH; LVEF >55%.  There was moderate LEFT and mild RIGHT atrial enlargement.  There was mild mitral annular calcification.  Pulmonary valve regurgitation was mild.  Patient with moderate aortic, mitral, and tricuspid valve regurgitation. There was no evidence of significant transvalvular gradient suggestive of stenosis.     Patient with an atrial fibrillation diagnosis; CHA2DS2-VASc Score = 7 (age x 2, sex, HTN, CVA x 2, prior MI). Her rate and rhythm are currently being maintained on oral metoprolol tartrate . She is chronically anticoagulated using apixaban; reported to be compliant with therapy with no evidence or reports of GI bleeding.  Blood pressure elevated at 158/80 mmHg despite currently prescribed CCB (amlodipine), diuretic (chlorthalidone), nitrate (isosorbide mononitrate), ARB (losartan), and beta-blocker (metoprolol tartrate) therapies. She is on a rosuvastatin for her HLD diagnosis and further ASCVD prevention.  Patient is not diabetic.  She does have an OSAH diagnosis and is reportedly compliant with prescribed nocturnal PAP therapy.  Functional capacity limited by age and multiple medical comorbidities.  Per cardiology, patient not  felt to be able to achieve 4 METS of physical activity without experiencing any angina/anginal equivalent symptoms.  No changes were made to her medication regimen.  Patient follow-up with outpatient cardiology in 1 year or sooner if needed.  CASARA PERRIER is scheduled for an elective TOTAL RIGHT SHOULDER ARTHROPLASTY on 03/26/2022 with Dr. Renee Harder, MD.  Given patient's past medical history significant for cardiovascular diagnoses, presurgical cardiac clearance was sought by the PAT team.  Per cardiology, "the patient is at the lowest risk possible for perioperative cardiovascular complications with the planned procedure.  The overall risk her procedure is low (<1%).  Currently has no evidence active and/or significant angina and/or congestive heart failure. Patient may proceed to surgery without restriction or need for further cardiovascular testing at an overall LOW risk". In review of her medication reconciliation, it is noted that patient is currently on prescribed daily anticoagulation and antiplatelet therapies. She has been instructed on recommendations for holding her apixaban for 3 days prior to her procedure with plans to restart as soon as postoperative bleeding risk felt to be minimized by her attending surgeon. The patient has been instructed that her last dose of her anticoagulant will be on 03/22/2022. She will continue her daily low dose ASA throughout her perioperative course.   Patient reports previous perioperative complications with anesthesia in the past. Patient has a PMH (+) for PONV. Symptoms and history of PONV will be discussed with patient by anesthesia team on the day of her procedure. Interventions will be ordered as deemed necessary based on patient's individual care needs as determined by anesthesiologist.  Patient also makes mention of the family that "most everyone in her family" has (+) PONV as well. In review of the available records, it is noted that patient  underwent a general anesthetic course at [facility] (ASA IV) in 01/2017 without documented complications.      03/13/2022    9:39  AM 11/06/2021    8:11 AM 10/17/2021    5:58 PM  Vitals with BMI  Height 5' 1.5"    Weight 193 lbs 187 lbs   BMI 95.09    Systolic 326 712 458  Diastolic 92 72 83  Pulse 77 70     Providers/Specialists:   NOTE: Primary physician provider listed below. Patient may have been seen by APP or partner within same practice.   PROVIDER ROLE / SPECIALTY LAST Loma Boston, MD Orthopedics (Surgeon) 03/11/2022  Eulis Foster, MD Primary Care Provider 11/06/2021  Serafina Royals, MD Cardiology 01/05/2022  Anthonette Legato, MD Nephrology 02/05/2022   Allergies:  Cephalexin, Atorvastatin, Morphine and related, Other, Procaine, Shellfish allergy, Statins, Tomato, and Penicillins  Current Home Medications:   No current facility-administered medications for this encounter.    acyclovir (ZOVIRAX) 400 MG tablet   albuterol (VENTOLIN HFA) 108 (90 Base) MCG/ACT inhaler   amLODipine (NORVASC) 5 MG tablet   apixaban (ELIQUIS) 5 MG TABS tablet   ascorbic acid (VITAMIN C) 500 MG tablet   aspirin 81 MG tablet   buPROPion (WELLBUTRIN XL) 300 MG 24 hr tablet   chlorthalidone (HYGROTON) 25 MG tablet   Cholecalciferol (VITAMIN D) 2000 units tablet   CRANBERRY PO   Cyanocobalamin (B-12) 500 MCG TABS   diazepam (VALIUM) 5 MG tablet   ELDERBERRY PO   EPINEPHrine (EPIPEN 2-PAK) 0.3 mg/0.3 mL IJ SOAJ injection   fluocinonide gel (LIDEX) 0.05 %   isosorbide mononitrate (IMDUR) 30 MG 24 hr tablet   levothyroxine (SYNTHROID) 100 MCG tablet   losartan (COZAAR) 25 MG tablet   metoprolol tartrate (LOPRESSOR) 25 MG tablet   mometasone (ELOCON) 0.1 % cream   Multiple Vitamin (MULTI-VITAMINS) TABS   oxymetazoline (AFRIN) 0.05 % nasal spray   Pyridoxine HCl (VITAMIN B-6 PO)   rosuvastatin (CRESTOR) 20 MG tablet   sertraline (ZOLOFT) 25 MG tablet    tacrolimus (PROTOPIC) 0.1 % ointment   tacrolimus (PROTOPIC) 0.1 % ointment   zinc gluconate 50 MG tablet   zolpidem (AMBIEN) 5 MG tablet   History:   Past Medical History:  Diagnosis Date   Abnormal nuclear stress test 05/24/2017   Anxiety    a.) on BZO (diazepam) PRN   Aortic atherosclerosis (HCC)    Arthritis    Asthma    Atrial fibrillation, transient (HCC)    a.) CHA2DS2VASc = 7 (age x 2, sex, HTN, CVA x 2, prior MI);  b.) rate/rhythm maintained on oral metoprolol tartrate; chronically anticoagulated with apixaban   Benign neoplasm of ascending colon    Benign neoplasm of descending colon    CAD (coronary artery disease)    a.) LHC at [facility] 02/20/2017: 80% oRI-RI, 90% o-pLAD, 95% mLAD, 99% p-mRCA --> urgent CVTS consult; b.) 3v CABG at [facility]; c.) LHC 05/24/2017: 100% o-pLAD, 80% oRI-RI, 100% p-mRCA, SVG-RCA patent, SVG-oD1 with min luminal irregs, LIMA-LAD patent -- med mgmt.   Chronic lower back pain    CKD (chronic kidney disease) stage 3, GFR 30-59 ml/min (HCC)    Complication of anesthesia    DDD (degenerative disc disease), cervical    Depression    Dyspnea    Family history of adverse reaction to anesthesia    most of family - PONV   Fibromyalgia    GERD (gastroesophageal reflux disease)    Glaucoma    History of 2019 novel coronavirus disease (COVID-19) 02/02/2019   History of bilateral cataract extraction    HTN (hypertension) 03/09/2007  Hyperlipidemia LDL goal <70 03/25/1993   Hypertension    Hypothyroidism    Insomnia    a.) on hypnotic (zolpidem) PRN   Long term current use of anticoagulant    a.) apixaban   Migraines    a.) 1-2x/mo   Motion sickness    all moving vehicles   NSTEMI (non-ST elevated myocardial infarction) ([place]) 02/20/2017   a.) LHC at [facility] 02/20/2017: 80% oRI-RI, 90% o-pLAD, 95% mLAD, 99% p-mRCA --> urgent CVTS consult; b.) 3v CABG at [facility]   OSA on CPAP    PONV (postoperative nausea and vomiting)    S/P CABG x 3    a.) LIMA-LAD,  SVG-dRCA, SVG-D1   Squamous cell carcinoma of skin 07/25/2019   Left upper lateral lip above vermillion border. WD SCC with superficial infiltration., MOHs 10/02/19   Stroke ([place]) 10/22/2017   a.) CT/MRI 10/22/2017 - RIGHT occipitoparietal infarct   Vitamin D deficiency    Past Surgical History:  Procedure Laterality Date   ABDOMINAL HYSTERECTOMY     APPENDECTOMY     BLADDER SURGERY     CATARACT EXTRACTION W/ INTRAOCULAR LENS  IMPLANT, BILATERAL     COLONOSCOPY WITH PROPOFOL N/A 01/17/2016   Procedure: COLONOSCOPY WITH PROPOFOL;  Surgeon: [name], MD;  Location: [place];  Service: Endoscopy;  Laterality: N/A;   CORONARY ARTERY BYPASS GRAFT N/A 02/20/2017   Procedure: CORONARY ARTERY BYPASS GRAFTING (CABG) x , three using left internal mammary artery to left anterior descending coronary artery and right greater saphenous vein harvested endoscopically to distal right and diagonal coronary arteries.;  Surgeon: Grace Isaac, MD;  Location: [place];  Service: Open Heart Surgery;  Laterality: N/A;   LEFT HEART CATH AND CORONARY ANGIOGRAPHY N/A 02/20/2017   Procedure: LEFT HEART CATH AND CORONARY ANGIOGRAPHY;  Surgeon: Isaias Cowman, MD;  Location: [place] CV LAB;  Service: Cardiovascular;  Laterality: N/A;   LEFT HEART CATH AND CORS/GRAFTS ANGIOGRAPHY N/A 05/24/2017   Procedure: LEFT HEART CATH AND CORS/GRAFTS ANGIOGRAPHY;  Surgeon: Sherren Mocha, MD;  Location: [place] CV LAB;  Service: Cardiovascular;  Laterality: N/A;   NASAL SINUS SURGERY     POLYPECTOMY  01/17/2016   Procedure: POLYPECTOMY;  Surgeon: [name], MD;  Location: [place];  Service: Endoscopy;;   RIGHT OOPHORECTOMY     TEE WITHOUT CARDIOVERSION N/A 02/20/2017   Procedure: TRANSESOPHAGEAL ECHOCARDIOGRAM (TEE);  Surgeon: Grace Isaac, MD;  Location: [place];  Service: Open Heart Surgery;  Laterality: N/A;   Family History  Problem Relation Age of Onset   Alzheimer's disease  Mother    Kidney cancer Mother    Heart attack Father    Pancreatic cancer Sister    Heart attack Brother 52   Ovarian cancer Sister    Kidney disease Sister        dialysis   Cancer Sister        uterin   Diabetes Sister    Heart attack Sister    Heart attack Brother 52   Ovarian cancer Sister    Social History   Tobacco Use   Smoking status: Former    Types: Cigarettes    Quit date: 05/26/1975    Years since quitting: 46.8   Smokeless tobacco: Never   Tobacco comments:    quit 45  years ago   Vaping Use   Vaping Use: Never used  Substance Use Topics   Alcohol use: No   Drug use: No    Pertinent Clinical Results:  LABS: Labs reviewed: Acceptable for surgery.  Component Date Value Ref Range Status   MRSA, PCR 03/13/2022 NEGATIVE  NEGATIVE Final   Staphylococcus aureus 03/13/2022 NEGATIVE  NEGATIVE Final   Comment: (NOTE) The Xpert SA Assay (FDA approved for NASAL specimens in patients 68 years of age and older), is one component of a comprehensive surveillance program. It is not intended to diagnose infection nor to guide or monitor treatment. Performed at [facility], [address] [place], [place] [address]    Sodium 03/13/2022 135  135 - 145 mmol/L Final   Potassium 03/13/2022 3.6  3.5 - 5.1 mmol/L Final   Chloride 03/13/2022 95 (L)  98 - 111 mmol/L Final   CO2 03/13/2022 30  22 - 32 mmol/L Final   Glucose, Bld 03/13/2022 122 (H)  70 - 99 mg/dL Final   Glucose reference range applies only to samples taken after fasting for at least 8 hours.   BUN 03/13/2022 17  8 - 23 mg/dL Final   Creatinine, Ser 03/13/2022 0.97  0.44 - 1.00 mg/dL Final   Calcium 03/13/2022 9.4  8.9 - 10.3 mg/dL Final   GFR, Estimated 03/13/2022 59 (L)  >60 mL/min Final   Comment: (NOTE) Calculated using the CKD-EPI Creatinine Equation (2021)    Anion gap 03/13/2022 10  5 - 15 Final   Performed at [facility], [address] [place], [place] [address]   WBC  03/13/2022 6.2  4.0 - 10.5 K/uL Final   RBC 03/13/2022 4.77  3.87 - 5.11 MIL/uL Final   Hemoglobin 03/13/2022 13.8  12.0 - 15.0 g/dL Final   HCT 03/13/2022 42.7  36.0 - 46.0 % Final   MCV 03/13/2022 89.5  80.0 - 100.0 fL Final   MCH 03/13/2022 28.9  26.0 - 34.0 pg Final   MCHC 03/13/2022 32.3  30.0 - 36.0 g/dL Final   RDW 03/13/2022 12.6  11.5 - 15.5 % Final   Platelets 03/13/2022 183  150 - 400 K/uL Final   nRBC 03/13/2022 0.0  0.0 - 0.2 % Final   Performed at [facility], [address] [place], [place] [address]   ABO/RH(D) 03/13/2022 A POS   Final   Antibody Screen 03/13/2022 NEG   Final   Sample Expiration 03/13/2022 03/27/2022,2359   Final   Extend sample reason 03/13/2022    Final                   Value:NO TRANSFUSIONS OR PREGNANCY IN THE PAST 3 MONTHS Performed at [facility], [address] [place], [place] [address]    Color, Urine 03/13/2022 YELLOW (A)  YELLOW Final   APPearance 03/13/2022 CLEAR (A)  CLEAR Final   Specific Gravity, Urine 03/13/2022 1.014  1.005 - 1.030 Final   pH 03/13/2022 7.0  5.0 - 8.0 Final   Glucose, UA 03/13/2022 NEGATIVE  NEGATIVE mg/dL Final   Hgb urine dipstick 03/13/2022 NEGATIVE  NEGATIVE Final   Bilirubin Urine 03/13/2022 NEGATIVE  NEGATIVE Final   Ketones, ur 03/13/2022 NEGATIVE  NEGATIVE mg/dL Final   Protein, ur 03/13/2022 NEGATIVE  NEGATIVE mg/dL Final   Nitrite 03/13/2022 NEGATIVE  NEGATIVE Final   Leukocytes,Ua 03/13/2022 MODERATE (A)  NEGATIVE Final   RBC / HPF 03/13/2022 0-5  0 - 5 RBC/hpf Final   WBC, UA 03/13/2022 11-20  0 - 5 WBC/hpf Final   Bacteria, UA 03/13/2022 NONE SEEN  NONE SEEN Final   Squamous Epithelial / LPF 03/13/2022 0-5  0 - 5  Final   Mucus 03/13/2022 PRESENT   Final   Performed at [facility], [address] [place], [place] [address]    ECG: Date: 03/13/2022 Time ECG obtained: 0906 AM Rate: 68 bpm Rhythm: atrial fibrillation Axis (leads I and aVF): Normal Intervals: QRS  70 ms. QTc 412 ms. ST segment and T wave changes: No evidence of acute ST segment elevation or depression Comparison: Similar to previous tracing obtained on 10/03/2020   IMAGING / PROCEDURES: CT SHOULDER RIGHT WO CONTRAST performed on 01/16/2022 Severe osteoarthritis of the right glenohumeral joint. Likely large full-thickness versus complete tear of the supraspinatus tendon. Aortic atherosclerosis    MYOCARDIAL PERFUSION IMAGING STUDY (LEXISCAN) performed on 08/07/2021 Normal left ventricular systolic function with a normal LVEF of 61% Normal myocardial thickening and wall motion Left ventricular cavity size normal SPECT images demonstrate small fixed perfusion abnormality of moderate intensity is present in the anterior myocardial region on the stress images.Resting images show minimal small perfusion defect of moderate intensity of the anterior myocardium consistent with possible previous infarct and/or scar or breast attenuation without evidence of myocardial ischemia Indeterminate Lexiscan infusion EKG due to baseline EKG changes and atrial fibrillation  TRANSTHORACIC ECHOCARDIOGRAM performed on 08/07/2021 Normal left ventricular systolic function with an EF of >55% Mild LVH Mild MAC Moderate LEFT and mild RIGHT atrial enlargement Normal left ventricular diastolic Doppler parameters Normal right ventricular systolic function Mild PR Moderate AR, MR, TR Normal transvalvular gradients; no valvular stenosis No pericardial effusion  LEFT HEART CATHETERIZATION AND CORONARY ANGIOGRAPHY performed on 05/24/2017 Severe 3 vessel CAD with total occlusion of the RCA and LAD, severe stenosis of a small intermediate branch S/P CABG with wide patency of the LIMA-LAD and SVG-distal RCA, patency of the SVG-diagonal with slow flow, likely secondary to SVG-target vessel size mismatch Moderately elevated LVEDP Recommend: medical management.   Impression and Plan:  Stacey [name] has been  referred for pre-anesthesia review and clearance prior to her undergoing the planned anesthetic and procedural courses. Available labs, pertinent testing, and imaging results were personally reviewed by me. This patient has been appropriately cleared by cardiology with an overall LOW risk of significant perioperative cardiovascular complications.  Based on clinical review performed today (03/24/22), barring any significant acute changes in the patient's overall condition, it is anticipated that she will be able to proceed with the planned surgical intervention. Any acute changes in clinical condition may necessitate her procedure being postponed and/or cancelled. Patient will meet with anesthesia team (MD and/or CRNA) on the day of her procedure for preoperative evaluation/assessment. Questions regarding anesthetic course will be fielded at that time.   Pre-surgical instructions were reviewed with the patient during her PAT appointment and questions were fielded by PAT clinical staff. Patient was advised that if any questions or concerns arise prior to her procedure then she should return a call to PAT and/or her surgeon's office to discuss.  Honor Loh, MSN, APRN, FNP-C, CEN [facility]  Peri-operative Services Nurse Practitioner Phone: [phone] Fax: [phone] 03/24/22 2:07 PM  NOTE: This note has been prepared using Dragon dictation software. Despite my best ability to proofread, there is always the potential that unintentional transcriptional errors may still occur from this process.

## 2022-03-26 ENCOUNTER — Other Ambulatory Visit: Payer: Self-pay

## 2022-03-26 ENCOUNTER — Encounter: Payer: Self-pay | Admitting: Orthopaedic Surgery

## 2022-03-26 ENCOUNTER — Inpatient Hospital Stay: Payer: PPO | Admitting: Urgent Care

## 2022-03-26 ENCOUNTER — Encounter
Admission: RE | Disposition: A | Discharge status: Home / Self Care | Payer: Self-pay | Source: Home / Self Care | Attending: Internal Medicine

## 2022-03-26 ENCOUNTER — Observation Stay
Admission: RE | Admit: 2022-03-26 | Discharge: 2022-03-27 | Disposition: A | Discharge status: Home / Self Care | Payer: PPO | Attending: Osteopathic Medicine | Admitting: Osteopathic Medicine

## 2022-03-26 ENCOUNTER — Observation Stay: Payer: PPO

## 2022-03-26 DIAGNOSIS — J45909 Unspecified asthma, uncomplicated: Secondary | ICD-10-CM | POA: Diagnosis present

## 2022-03-26 DIAGNOSIS — Z471 Aftercare following joint replacement surgery: Secondary | ICD-10-CM | POA: Diagnosis not present

## 2022-03-26 DIAGNOSIS — Z951 Presence of aortocoronary bypass graft: Secondary | ICD-10-CM | POA: Diagnosis not present

## 2022-03-26 DIAGNOSIS — Z79899 Other long term (current) drug therapy: Secondary | ICD-10-CM | POA: Diagnosis not present

## 2022-03-26 DIAGNOSIS — E785 Hyperlipidemia, unspecified: Secondary | ICD-10-CM | POA: Diagnosis not present

## 2022-03-26 DIAGNOSIS — I480 Paroxysmal atrial fibrillation: Secondary | ICD-10-CM | POA: Diagnosis present

## 2022-03-26 DIAGNOSIS — E039 Hypothyroidism, unspecified: Secondary | ICD-10-CM | POA: Diagnosis present

## 2022-03-26 DIAGNOSIS — E669 Obesity, unspecified: Secondary | ICD-10-CM | POA: Diagnosis present

## 2022-03-26 DIAGNOSIS — Z87891 Personal history of nicotine dependence: Secondary | ICD-10-CM | POA: Diagnosis not present

## 2022-03-26 DIAGNOSIS — F418 Other specified anxiety disorders: Secondary | ICD-10-CM | POA: Diagnosis not present

## 2022-03-26 DIAGNOSIS — R600 Localized edema: Secondary | ICD-10-CM | POA: Insufficient documentation

## 2022-03-26 DIAGNOSIS — Z9981 Dependence on supplemental oxygen: Secondary | ICD-10-CM | POA: Diagnosis not present

## 2022-03-26 DIAGNOSIS — R0602 Shortness of breath: Secondary | ICD-10-CM | POA: Diagnosis not present

## 2022-03-26 DIAGNOSIS — Z8673 Personal history of transient ischemic attack (TIA), and cerebral infarction without residual deficits: Secondary | ICD-10-CM | POA: Diagnosis not present

## 2022-03-26 DIAGNOSIS — I10 Essential (primary) hypertension: Secondary | ICD-10-CM | POA: Diagnosis present

## 2022-03-26 DIAGNOSIS — I4581 Long QT syndrome: Secondary | ICD-10-CM | POA: Diagnosis not present

## 2022-03-26 DIAGNOSIS — G4733 Obstructive sleep apnea (adult) (pediatric): Secondary | ICD-10-CM | POA: Diagnosis present

## 2022-03-26 DIAGNOSIS — M19019 Primary osteoarthritis, unspecified shoulder: Secondary | ICD-10-CM | POA: Diagnosis not present

## 2022-03-26 DIAGNOSIS — Z1152 Encounter for screening for COVID-19: Secondary | ICD-10-CM | POA: Diagnosis not present

## 2022-03-26 DIAGNOSIS — I129 Hypertensive chronic kidney disease with stage 1 through stage 4 chronic kidney disease, or unspecified chronic kidney disease: Secondary | ICD-10-CM | POA: Diagnosis not present

## 2022-03-26 DIAGNOSIS — Z8616 Personal history of COVID-19: Secondary | ICD-10-CM | POA: Diagnosis not present

## 2022-03-26 DIAGNOSIS — N1831 Chronic kidney disease, stage 3a: Secondary | ICD-10-CM | POA: Diagnosis present

## 2022-03-26 DIAGNOSIS — I252 Old myocardial infarction: Secondary | ICD-10-CM | POA: Diagnosis not present

## 2022-03-26 DIAGNOSIS — Z96611 Presence of right artificial shoulder joint: Secondary | ICD-10-CM | POA: Diagnosis not present

## 2022-03-26 DIAGNOSIS — I2510 Atherosclerotic heart disease of native coronary artery without angina pectoris: Secondary | ICD-10-CM | POA: Diagnosis present

## 2022-03-26 DIAGNOSIS — Z7901 Long term (current) use of anticoagulants: Secondary | ICD-10-CM | POA: Insufficient documentation

## 2022-03-26 DIAGNOSIS — I639 Cerebral infarction, unspecified: Secondary | ICD-10-CM | POA: Diagnosis present

## 2022-03-26 DIAGNOSIS — M19011 Primary osteoarthritis, right shoulder: Secondary | ICD-10-CM | POA: Diagnosis not present

## 2022-03-26 HISTORY — DX: Other cervical disc degeneration, unspecified cervical region: M50.30

## 2022-03-26 HISTORY — DX: Gastro-esophageal reflux disease without esophagitis: K21.9

## 2022-03-26 HISTORY — DX: Migraine, unspecified, not intractable, without status migrainosus: G43.909

## 2022-03-26 HISTORY — DX: Other chronic pain: G89.29

## 2022-03-26 HISTORY — DX: Presence of aortocoronary bypass graft: Z95.1

## 2022-03-26 HISTORY — PX: TOTAL SHOULDER ARTHROPLASTY: SHX126

## 2022-03-26 HISTORY — DX: Cataract extraction status, left eye: Z98.42

## 2022-03-26 HISTORY — DX: Atherosclerosis of aorta: I70.0

## 2022-03-26 HISTORY — DX: Insomnia, unspecified: G47.00

## 2022-03-26 HISTORY — DX: Cataract extraction status, right eye: Z98.41

## 2022-03-26 HISTORY — DX: Low back pain, unspecified: M54.50

## 2022-03-26 HISTORY — DX: Anxiety disorder, unspecified: F41.9

## 2022-03-26 HISTORY — DX: Long term (current) use of anticoagulants: Z79.01

## 2022-03-26 HISTORY — DX: Vitamin D deficiency, unspecified: E55.9

## 2022-03-26 HISTORY — DX: Obstructive sleep apnea (adult) (pediatric): G47.33

## 2022-03-26 LAB — BRAIN NATRIURETIC PEPTIDE: B Natriuretic Peptide: 329.6 pg/mL — ABNORMAL HIGH (ref 0.0–100.0)

## 2022-03-26 SURGERY — ARTHROPLASTY, SHOULDER, TOTAL
Anesthesia: General | Site: Shoulder | Laterality: Right

## 2022-03-26 MED ORDER — SUCCINYLCHOLINE CHLORIDE 200 MG/10ML IV SOSY: PREFILLED_SYRINGE | INTRAVENOUS | Status: DC | PRN

## 2022-03-26 MED ORDER — FENTANYL CITRATE (PF) 100 MCG/2ML IJ SOLN
INTRAMUSCULAR | Status: AC
  Administered 2022-03-26: 25 ug via INTRAVENOUS
  Filled 2022-03-26: qty 2

## 2022-03-26 MED ORDER — VANCOMYCIN HCL 1000 MG IV SOLR
INTRAVENOUS | Status: DC | PRN
  Administered 2022-03-26: 1000 mg via TOPICAL

## 2022-03-26 MED ORDER — ASPIRIN 81 MG PO TBEC
81.0000 mg | DELAYED_RELEASE_TABLET | Freq: Every day | ORAL | Status: DC
  Administered 2022-03-27: 81 mg via ORAL
  Filled 2022-03-26: qty 1

## 2022-03-26 MED ORDER — CHLORHEXIDINE GLUCONATE 0.12 % MT SOLN
OROMUCOSAL | Status: AC
  Administered 2022-03-26: 15 mL via OROMUCOSAL
  Filled 2022-03-26: qty 15

## 2022-03-26 MED ORDER — TRANEXAMIC ACID-NACL 1000-0.7 MG/100ML-% IV SOLN
1000.0000 mg | INTRAVENOUS | Status: AC
  Administered 2022-03-26 (×2): 1000 mg via INTRAVENOUS

## 2022-03-26 MED ORDER — DOCUSATE SODIUM 100 MG PO CAPS
100.0000 mg | ORAL_CAPSULE | Freq: Two times a day (BID) | ORAL | Status: DC
  Administered 2022-03-26 – 2022-03-27 (×2): 100 mg via ORAL
  Filled 2022-03-26 (×2): qty 1

## 2022-03-26 MED ORDER — METOCLOPRAMIDE HCL 5 MG/ML IJ SOLN: 5.0000 mg | Freq: Three times a day (TID) | INTRAMUSCULAR | Status: DC | PRN

## 2022-03-26 MED ORDER — ACETAMINOPHEN 325 MG PO TABS
325.0000 mg | ORAL_TABLET | Freq: Four times a day (QID) | ORAL | Status: DC | PRN
  Administered 2022-03-27: 500 mg via ORAL

## 2022-03-26 MED ORDER — VANCOMYCIN HCL IN DEXTROSE 1-5 GM/200ML-% IV SOLN
1000.0000 mg | Freq: Two times a day (BID) | INTRAVENOUS | Status: AC
  Administered 2022-03-26: 1000 mg via INTRAVENOUS
  Filled 2022-03-26: qty 200

## 2022-03-26 MED ORDER — ROCURONIUM BROMIDE 100 MG/10ML IV SOLN
INTRAVENOUS | Status: DC | PRN
  Administered 2022-03-26: 50 mg via INTRAVENOUS
  Administered 2022-03-26: 30 mg via INTRAVENOUS
  Administered 2022-03-26: 20 mg via INTRAVENOUS

## 2022-03-26 MED ORDER — VANCOMYCIN HCL IN DEXTROSE 1-5 GM/200ML-% IV SOLN
1000.0000 mg | INTRAVENOUS | Status: AC
  Administered 2022-03-26: 1000 mg via INTRAVENOUS

## 2022-03-26 MED ORDER — PROPOFOL 10 MG/ML IV BOLUS
INTRAVENOUS | Status: DC | PRN
  Administered 2022-03-26: 150 mg via INTRAVENOUS

## 2022-03-26 MED ORDER — ONDANSETRON HCL 4 MG/2ML IJ SOLN: 4.0000 mg | Freq: Four times a day (QID) | INTRAMUSCULAR | Status: DC | PRN

## 2022-03-26 MED ORDER — BUPIVACAINE LIPOSOME 1.3 % IJ SUSP
INTRAMUSCULAR | Status: AC
  Filled 2022-03-26: qty 20

## 2022-03-26 MED ORDER — ORAL CARE MOUTH RINSE: 15.0000 mL | Freq: Once | OROMUCOSAL | Status: AC

## 2022-03-26 MED ORDER — DIPHENHYDRAMINE HCL 12.5 MG/5ML PO ELIX: 12.5000 mg | ORAL_SOLUTION | ORAL | Status: DC | PRN

## 2022-03-26 MED ORDER — 0.9 % SODIUM CHLORIDE (POUR BTL) OPTIME
TOPICAL | Status: DC | PRN
  Administered 2022-03-26: 1000 mL

## 2022-03-26 MED ORDER — PROPOFOL 10 MG/ML IV BOLUS
INTRAVENOUS | Status: AC
  Filled 2022-03-26: qty 20

## 2022-03-26 MED ORDER — METHOCARBAMOL 500 MG PO TABS
500.0000 mg | ORAL_TABLET | Freq: Four times a day (QID) | ORAL | Status: DC | PRN
  Administered 2022-03-27: 500 mg via ORAL
  Filled 2022-03-26: qty 1

## 2022-03-26 MED ORDER — VITAMIN C 500 MG PO TABS
2000.0000 mg | ORAL_TABLET | Freq: Every day | ORAL | Status: DC
  Administered 2022-03-26 – 2022-03-27 (×2): 2000 mg via ORAL
  Filled 2022-03-26 (×2): qty 4

## 2022-03-26 MED ORDER — PROPOFOL 1000 MG/100ML IV EMUL
INTRAVENOUS | Status: AC
  Filled 2022-03-26: qty 100

## 2022-03-26 MED ORDER — ACETAMINOPHEN 500 MG PO TABS
ORAL_TABLET | ORAL | Status: AC
  Administered 2022-03-26: 1000 mg via ORAL
  Filled 2022-03-26: qty 2

## 2022-03-26 MED ORDER — LACTATED RINGERS IV SOLN: INTRAVENOUS | Status: DC

## 2022-03-26 MED ORDER — ACETAMINOPHEN 500 MG PO TABS
500.0000 mg | ORAL_TABLET | Freq: Four times a day (QID) | ORAL | Status: DC
  Administered 2022-03-26: 500 mg via ORAL
  Filled 2022-03-26 (×2): qty 1

## 2022-03-26 MED ORDER — FENTANYL CITRATE (PF) 100 MCG/2ML IJ SOLN
25.0000 ug | INTRAMUSCULAR | Status: AC | PRN
  Administered 2022-03-26 (×4): 25 ug via INTRAVENOUS

## 2022-03-26 MED ORDER — ROSUVASTATIN CALCIUM 10 MG PO TABS
20.0000 mg | ORAL_TABLET | Freq: Every day | ORAL | Status: DC
  Administered 2022-03-26 – 2022-03-27 (×2): 20 mg via ORAL
  Filled 2022-03-26 (×2): qty 2

## 2022-03-26 MED ORDER — PHENOL 1.4 % MT LIQD: 1.0000 | OROMUCOSAL | Status: DC | PRN

## 2022-03-26 MED ORDER — ALBUTEROL SULFATE (2.5 MG/3ML) 0.083% IN NEBU: 3.0000 mL | INHALATION_SOLUTION | RESPIRATORY_TRACT | Status: DC | PRN

## 2022-03-26 MED ORDER — BUPIVACAINE LIPOSOME 1.3 % IJ SUSP
INTRAMUSCULAR | Status: DC | PRN
  Administered 2022-03-26: 20 mL

## 2022-03-26 MED ORDER — LEVOTHYROXINE SODIUM 50 MCG PO TABS
100.0000 ug | ORAL_TABLET | Freq: Every day | ORAL | Status: DC
  Administered 2022-03-27: 100 ug via ORAL
  Filled 2022-03-26: qty 2

## 2022-03-26 MED ORDER — ACETAMINOPHEN 10 MG/ML IV SOLN
INTRAVENOUS | Status: DC | PRN
  Administered 2022-03-26: 1000 mg via INTRAVENOUS

## 2022-03-26 MED ORDER — METOCLOPRAMIDE HCL 5 MG PO TABS: 5.0000 mg | ORAL_TABLET | Freq: Three times a day (TID) | ORAL | Status: DC | PRN

## 2022-03-26 MED ORDER — PHENYLEPHRINE 80 MCG/ML (10ML) SYRINGE FOR IV PUSH (FOR BLOOD PRESSURE SUPPORT)
PREFILLED_SYRINGE | INTRAVENOUS | Status: DC | PRN
  Administered 2022-03-26: 160 ug via INTRAVENOUS

## 2022-03-26 MED ORDER — DEXAMETHASONE SODIUM PHOSPHATE 10 MG/ML IJ SOLN
INTRAMUSCULAR | Status: DC | PRN
  Administered 2022-03-26: 10 mg via INTRAVENOUS

## 2022-03-26 MED ORDER — HYDROCODONE-ACETAMINOPHEN 5-325 MG PO TABS
1.0000 | ORAL_TABLET | ORAL | Status: DC | PRN
  Administered 2022-03-26: 2 via ORAL
  Filled 2022-03-26: qty 2

## 2022-03-26 MED ORDER — BUPROPION HCL ER (XL) 150 MG PO TB24
300.0000 mg | ORAL_TABLET | Freq: Every day | ORAL | Status: DC
  Administered 2022-03-27: 300 mg via ORAL
  Filled 2022-03-26: qty 2

## 2022-03-26 MED ORDER — PHENYLEPHRINE HCL-NACL 20-0.9 MG/250ML-% IV SOLN
INTRAVENOUS | Status: AC
  Filled 2022-03-26: qty 250

## 2022-03-26 MED ORDER — OXYCODONE HCL 5 MG/5ML PO SOLN: 5.0000 mg | Freq: Once | ORAL | Status: AC | PRN

## 2022-03-26 MED ORDER — METOPROLOL TARTRATE 25 MG PO TABS
25.0000 mg | ORAL_TABLET | Freq: Every day | ORAL | Status: DC
  Administered 2022-03-26: 25 mg via ORAL
  Filled 2022-03-26: qty 1

## 2022-03-26 MED ORDER — PROPOFOL 500 MG/50ML IV EMUL
INTRAVENOUS | Status: DC | PRN
  Administered 2022-03-26: 125 ug/kg/min via INTRAVENOUS

## 2022-03-26 MED ORDER — FENTANYL CITRATE PF 50 MCG/ML IJ SOSY: 12.5000 ug | PREFILLED_SYRINGE | INTRAMUSCULAR | Status: DC | PRN

## 2022-03-26 MED ORDER — ACETAMINOPHEN 10 MG/ML IV SOLN
INTRAVENOUS | Status: AC
  Filled 2022-03-26: qty 100

## 2022-03-26 MED ORDER — ONDANSETRON HCL 4 MG/2ML IJ SOLN
INTRAMUSCULAR | Status: AC
  Filled 2022-03-26: qty 2

## 2022-03-26 MED ORDER — APIXABAN 5 MG PO TABS
5.0000 mg | ORAL_TABLET | Freq: Two times a day (BID) | ORAL | Status: DC
  Administered 2022-03-27: 5 mg via ORAL
  Filled 2022-03-26: qty 1

## 2022-03-26 MED ORDER — TRANEXAMIC ACID-NACL 1000-0.7 MG/100ML-% IV SOLN
INTRAVENOUS | Status: AC
  Filled 2022-03-26: qty 100

## 2022-03-26 MED ORDER — OXYCODONE HCL 5 MG PO TABS
5.0000 mg | ORAL_TABLET | Freq: Once | ORAL | Status: AC | PRN
  Administered 2022-03-26: 5 mg via ORAL

## 2022-03-26 MED ORDER — METOPROLOL TARTRATE 25 MG PO TABS
50.0000 mg | ORAL_TABLET | Freq: Every morning | ORAL | Status: DC
  Administered 2022-03-27: 50 mg via ORAL
  Filled 2022-03-26: qty 2

## 2022-03-26 MED ORDER — VANCOMYCIN HCL 1000 MG IV SOLR
INTRAVENOUS | Status: AC
  Filled 2022-03-26: qty 20

## 2022-03-26 MED ORDER — PHENYLEPHRINE HCL-NACL 20-0.9 MG/250ML-% IV SOLN
INTRAVENOUS | Status: DC | PRN
  Administered 2022-03-26: 40 ug/min via INTRAVENOUS

## 2022-03-26 MED ORDER — SUGAMMADEX SODIUM 200 MG/2ML IV SOLN
INTRAVENOUS | Status: DC | PRN
  Administered 2022-03-26: 200 mg via INTRAVENOUS

## 2022-03-26 MED ORDER — LIDOCAINE HCL (CARDIAC) PF 100 MG/5ML IV SOSY
PREFILLED_SYRINGE | INTRAVENOUS | Status: DC | PRN
  Administered 2022-03-26: 80 mg via INTRAVENOUS

## 2022-03-26 MED ORDER — TRANEXAMIC ACID 1000 MG/10ML IV SOLN
INTRAVENOUS | Status: AC
  Filled 2022-03-26: qty 10

## 2022-03-26 MED ORDER — FENTANYL CITRATE (PF) 100 MCG/2ML IJ SOLN
INTRAMUSCULAR | Status: DC | PRN
  Administered 2022-03-26 (×2): 25 ug via INTRAVENOUS
  Administered 2022-03-26: 50 ug via INTRAVENOUS

## 2022-03-26 MED ORDER — FENTANYL CITRATE (PF) 100 MCG/2ML IJ SOLN
INTRAMUSCULAR | Status: AC
  Filled 2022-03-26: qty 2

## 2022-03-26 MED ORDER — SERTRALINE HCL 50 MG PO TABS
25.0000 mg | ORAL_TABLET | Freq: Every day | ORAL | Status: DC
  Administered 2022-03-26: 25 mg via ORAL
  Filled 2022-03-26: qty 1

## 2022-03-26 MED ORDER — METHOCARBAMOL 1000 MG/10ML IJ SOLN: 500.0000 mg | Freq: Four times a day (QID) | INTRAVENOUS | Status: DC | PRN

## 2022-03-26 MED ORDER — HYDROCODONE-ACETAMINOPHEN 7.5-325 MG PO TABS
1.0000 | ORAL_TABLET | ORAL | Status: DC | PRN
  Administered 2022-03-26 – 2022-03-27 (×2): 2 via ORAL
  Filled 2022-03-26: qty 1
  Filled 2022-03-26 (×2): qty 2

## 2022-03-26 MED ORDER — ONDANSETRON HCL 4 MG/2ML IJ SOLN
INTRAMUSCULAR | Status: DC | PRN
  Administered 2022-03-26: 4 mg via INTRAVENOUS

## 2022-03-26 MED ORDER — BUPIVACAINE HCL (PF) 0.5 % IJ SOLN
INTRAMUSCULAR | Status: DC | PRN
  Administered 2022-03-26: 30 mL

## 2022-03-26 MED ORDER — BUPIVACAINE HCL (PF) 0.5 % IJ SOLN
INTRAMUSCULAR | Status: AC
  Filled 2022-03-26: qty 30

## 2022-03-26 MED ORDER — HYDRALAZINE HCL 20 MG/ML IJ SOLN: 5.0000 mg | INTRAMUSCULAR | Status: DC | PRN

## 2022-03-26 MED ORDER — DIAZEPAM 2 MG PO TABS: 2.0000 mg | ORAL_TABLET | Freq: Three times a day (TID) | ORAL | Status: DC | PRN

## 2022-03-26 MED ORDER — VANCOMYCIN HCL IN DEXTROSE 1-5 GM/200ML-% IV SOLN
INTRAVENOUS | Status: AC
  Filled 2022-03-26: qty 200

## 2022-03-26 MED ORDER — LIDOCAINE HCL (PF) 2 % IJ SOLN
INTRAMUSCULAR | Status: AC
  Filled 2022-03-26: qty 5

## 2022-03-26 MED ORDER — MENTHOL 3 MG MT LOZG: 1.0000 | LOZENGE | OROMUCOSAL | Status: DC | PRN

## 2022-03-26 MED ORDER — DM-GUAIFENESIN ER 30-600 MG PO TB12: 1.0000 | ORAL_TABLET | Freq: Two times a day (BID) | ORAL | Status: DC | PRN

## 2022-03-26 MED ORDER — CHLORHEXIDINE GLUCONATE 0.12 % MT SOLN: 15.0000 mL | Freq: Once | OROMUCOSAL | Status: AC

## 2022-03-26 MED ORDER — ONDANSETRON HCL 4 MG PO TABS: 4.0000 mg | ORAL_TABLET | Freq: Four times a day (QID) | ORAL | Status: DC | PRN

## 2022-03-26 MED ORDER — OXYCODONE HCL 5 MG PO TABS
ORAL_TABLET | ORAL | Status: AC
  Filled 2022-03-26: qty 1

## 2022-03-26 MED ORDER — VITAMIN D 25 MCG (1000 UNIT) PO TABS
2000.0000 [IU] | ORAL_TABLET | Freq: Every day | ORAL | Status: DC
  Administered 2022-03-26 – 2022-03-27 (×2): 2000 [IU] via ORAL
  Filled 2022-03-26 (×2): qty 2

## 2022-03-26 MED ORDER — DEXAMETHASONE SODIUM PHOSPHATE 10 MG/ML IJ SOLN
INTRAMUSCULAR | Status: AC
  Filled 2022-03-26: qty 1

## 2022-03-26 MED ORDER — ACETAMINOPHEN 500 MG PO TABS: 1000.0000 mg | ORAL_TABLET | Freq: Once | ORAL | Status: AC

## 2022-03-26 SURGICAL SUPPLY — 83 items
9628s IMPLANT
ADH LQ OCL WTPRF AMP STRL LF (MISCELLANEOUS)
ADH SKN CLS APL DERMABOND .7 (GAUZE/BANDAGES/DRESSINGS)
ADHESIVE MASTISOL STRL (MISCELLANEOUS) IMPLANT
APL PRP STRL LF DISP 70% ISPRP (MISCELLANEOUS) ×2
AR5400400Ns IMPLANT
BASEPLATE MOD 24 (Plate) IMPLANT
BIT DRILL FLUTED 3.0 STRL (BIT) IMPLANT
BLADE BOVIE TIP EXT 4 (BLADE) ×1 IMPLANT
BLADE SAW SAG 25.4X90 (BLADE) ×1 IMPLANT
BOWL CEMENT MIX W/ADAPTER (MISCELLANEOUS) ×1 IMPLANT
BSPLAT GLND 24 MDLR STRL LF (Plate) ×1 IMPLANT
CALIBRATOR GLENOID (SYSTAGENIX WOUND MANAGEMENT) IMPLANT
CALIBRATOR GLENOID VIP 5-D (SYSTAGENIX WOUND MANAGEMENT) IMPLANT
CHLORAPREP W/TINT 26 (MISCELLANEOUS) ×1 IMPLANT
COOLER POLAR GLACIER W/PUMP (MISCELLANEOUS) ×1 IMPLANT
COVER BACK TABLE REUSABLE LG (DRAPES) ×1 IMPLANT
COVER MAYO STAND STRL (DRAPES) ×1 IMPLANT
CUP SUT UNIV REV NEUTRAL 33 (Cup) IMPLANT
DERMABOND ADVANCED .7 DNX12 (GAUZE/BANDAGES/DRESSINGS) IMPLANT
DRAPE 3/4 80X56 (DRAPES) ×2 IMPLANT
DRAPE C-ARMOR (DRAPES) IMPLANT
DRAPE INCISE IOBAN 66X45 STRL (DRAPES) ×1 IMPLANT
DRAPE INCISE IOBAN 66X60 STRL (DRAPES) ×1 IMPLANT
DRAPE STERI 35X30 U-POUCH (DRAPES) ×1 IMPLANT
DRAPE U-SHAPE 47X51 STRL (DRAPES) ×2 IMPLANT
DRSG AQUACEL AG ADV 3.5X 6 (GAUZE/BANDAGES/DRESSINGS) IMPLANT
DRSG AQUACEL AG ADV 3.5X10 (GAUZE/BANDAGES/DRESSINGS) IMPLANT
ELECT BLADE 6.5 EXT (BLADE) ×1 IMPLANT
ELECT REM PT RETURN 9FT ADLT (ELECTROSURGICAL) ×1
ELECTRODE REM PT RTRN 9FT ADLT (ELECTROSURGICAL) IMPLANT
GAUZE 4X4 16PLY ~~LOC~~+RFID DBL (SPONGE) ×1 IMPLANT
GAUZE SPONGE 4X4 12PLY STRL (GAUZE/BANDAGES/DRESSINGS) ×1 IMPLANT
GLENOSPHERE 33+4 LAT/24 UNI RV (Joint) IMPLANT
GLOVE BIO SURGEON STRL SZ7.5 (GLOVE) ×2 IMPLANT
GLOVE BIOGEL PI IND STRL 7.5 (GLOVE) ×1 IMPLANT
GLOVE SURG ORTHO 8.0 STRL STRW (GLOVE) ×1 IMPLANT
GOWN STRL REUS W/ TWL XL LVL3 (GOWN DISPOSABLE) ×2 IMPLANT
GOWN STRL REUS W/TWL XL LVL3 (GOWN DISPOSABLE) ×2
KIT STABILIZATION SHOULDER (MISCELLANEOUS) ×1 IMPLANT
KIT TURNOVER KIT A (KITS) ×1 IMPLANT
LINER HUM CONST SHLD 33 +3 (Liner) IMPLANT
LINER HUM CONSTR SHLD 33 +3 (Liner) ×1 IMPLANT
MANIFOLD NEPTUNE II (INSTRUMENTS) ×3 IMPLANT
MASK FACE SPIDER DISP (MASK) ×1 IMPLANT
MAT ABSORB  FLUID 56X50 GRAY (MISCELLANEOUS) ×1
MAT ABSORB FLUID 56X50 GRAY (MISCELLANEOUS) ×1 IMPLANT
NS IRRIG 1000ML POUR BTL (IV SOLUTION) ×1 IMPLANT
PACK ARTHROSCOPY SHOULDER (MISCELLANEOUS) ×1 IMPLANT
PAD ARMBOARD 7.5X6 YLW CONV (MISCELLANEOUS) ×2 IMPLANT
PAD WRAPON POLAR SHDR XLG (MISCELLANEOUS) ×1 IMPLANT
PIN NITINOL TARGETER 2.8 (PIN) IMPLANT
PULSAVAC PLUS IRRIG FAN TIP (DISPOSABLE)
SCREW CENTRAL MODULAR 25 (Screw) IMPLANT
SCREW PERI LOCK 5.5X16 (Screw) IMPLANT
SCREW PERI LOCK 5.5X24 (Screw) IMPLANT
SCREW PERIPHERAL 5.5X20 LOCK (Screw) IMPLANT
SLEEVE SCD COMPRESS KNEE MED (STOCKING) IMPLANT
SLING ULTRA II LG (MISCELLANEOUS) IMPLANT
SLING ULTRA II M (MISCELLANEOUS) IMPLANT
SPACER SHLDR UNI REV 33 +12 (Shoulder) IMPLANT
SPONGE T-LAP 18X18 ~~LOC~~+RFID (SPONGE) ×1 IMPLANT
STEM HUM UNIV REV APEX SZ 6 (Stem) IMPLANT
STRAP SAFETY 5IN WIDE (MISCELLANEOUS) ×1 IMPLANT
STRIP CLOSURE SKIN 1/2X4 (GAUZE/BANDAGES/DRESSINGS) IMPLANT
SUT ETHIBOND 2 V 37 (SUTURE) ×1 IMPLANT
SUT MNCRL 3-0 UNDYED SH (SUTURE) ×1 IMPLANT
SUT MONOCRYL 3-0 UNDYED (SUTURE) ×1
SUT STRATAFIX PDS  2-0 CT-2 (SUTURE)
SUT STRATAFIX PDS 2-0 CT-2 (SUTURE) IMPLANT
SUT VIC AB 0 CT1 36 (SUTURE) ×1 IMPLANT
SUT VIC AB 2-0 CT1 (SUTURE) ×1 IMPLANT
SUTURE TAPE 1.3 40 TPR END (SUTURE) IMPLANT
SUTURETAPE 1.3 40 TPR END (SUTURE) ×6
SYR TOOMEY IRRIG 70ML (MISCELLANEOUS)
SYRINGE TOOMEY IRRIG 70ML (MISCELLANEOUS) ×1 IMPLANT
TAPE SURG TRANSPORE 1 IN (GAUZE/BANDAGES/DRESSINGS) ×1 IMPLANT
TAPE SURGICAL TRANSPORE 1 IN (GAUZE/BANDAGES/DRESSINGS) ×1
TIP FAN IRRIG PULSAVAC PLUS (DISPOSABLE) ×1 IMPLANT
TRAP FLUID SMOKE EVACUATOR (MISCELLANEOUS) ×1 IMPLANT
TUBING CONNECTING 10 (TUBING) ×1 IMPLANT
WRAPON POLAR PAD SHDR XLG (MISCELLANEOUS) ×1
vlpgc50-1 IMPLANT

## 2022-03-26 NOTE — Op Note (Addendum)
DATE OF SURGERY:  03/26/2022  TIME: 10:35 AM  PATIENT NAME:  Stacey [name]  AGE: 81 y.o.  ASSISTANT: Murilo Zanette, PA-C  PRE-OPERATIVE DIAGNOSIS:  M19.019 Primary osteoarthritis, unspecified shoulder  POST-OPERATIVE DIAGNOSIS:  SAME  PROCEDURE: Right reverse TOTAL SHOULDER ARTHROPLASTY, biceps tenodesis, capsular contracture release, use of preoperative 3D CT for templating and optimal component positioning  SURGEON:  Renee Harder  EBL:  628 cc  COMPLICATIONS: None  OPERATIVE IMPLANTS:   Arthrex universe reverse system with 24 mm baseplate, 33 mm +4 mm glenosphere, size 6 humeral stem, 33 mm suture, +12 humeral spacer, 3 mm constrained poly liner  PREOPERATIVE INDICATIONS:  Stacey [name] is a 81 y.o. year old who has had chronic persistent right shoulder pain consistent with advanced arthritis.  She has failed conservative management including cortisone injections physical therapy activity modification and elected for surgical intervention.     The risks benefits and alternatives were discussed with the patient including but not limited to the risks of nonoperative treatment, versus surgical intervention including infection, bleeding, nerve injury, malunion, nonunion, dislocation, hardware prominence, hardware failure, need for hardware removal, blood clots, cardiopulmonary complications, morbidity, mortality, among others, and they were willing to proceed.    OPERATIVE PROCEDURE:  The patient was brought to the operating room and placed in the supine position.  General anesthesia was administered.  Patient was then placed in the modified beachchair position.  All bony prominences were well-padded.  Right upper extremities prepped and draped in normal standard sterile fashion.  Preoperative timeout was taken and preoperative antibiotics were administered.  10 cm incision was made in a standard deltopectoral approach.  Cephalic vein was identified and retracted  laterally.  Kolbel retractor was placed.  Conjoined tendon was identified and retracted.  Biceps tendon was identified.  This was tenodesed to the superior margin of the pectoralis tendon with #2 Ethibond.  Subscapularis was identified and tagged suture was placed.  A subscapularis peel was performed.  3 sisters vessels were coagulated.  Subscapularis was released.  Capsular contracture release was performed inferiorly and posteriorly.  Humeral head was dislocated.  Humeral head cut was then performed with 30 degrees of retroversion.  Cut plate was placed on the humerus and attention was turned to the glenoid.  Glenoid was exposed with posterior, anterior, and superior retractors.  Biceps tendon was removed.  Labrum was circumferentially removed.  Capsular releases were performed anteriorly posteriorly and superiorly.  The glenoid vault was noted to be quite narrow.  3D guide was utilized to place the central glenoid pin.  This was based on preoperative CT scan for optimal positioning.  Glenoid reaming was performed.  Standard baseplate was utilized with a 25 mm central screw.  This was placed with appropriate purchase.  3 locking screws were then placed superiorly inferiorly and posteriorly.  33 mm +4 mm glenosphere was then placed.  Attention was then turned back to the humerus.  Canal finder was utilized followed by sequential broaching to a size 6 with appropriate fit and fill.  The shoulder was trialed with numerous spacers and polyethylene liners.  This was built up to a size 12 spacer and 3 mm constrained liner before there was appropriate stability of the shoulder.  All implants were removed.  Suture tape was placed through the lesser tuberosity for subscapularis repair prior to stent insertion.  6 mm humeral stem was implanted followed by 12 mm spacer and 3 mm constrained liner.  Shoulder was reduced and noted to be  stable with appropriate range of motion.    Thorough irrigation was performed.   Subscapularis repair was performed with the arm in approximately 20 degrees of external rotation.  50 cc of Marcaine and Exparel were injected into the capsule, deltoid and surrounding soft tissues.  1 g of vancomycin powder was placed.  Deltopectoral interval was closed with 0 Vicryl followed by 2-0 Vicryl in the dermal layer and 3-0 Monocryl.  Steri-Strips and an Aquacel dressing were applied.  Sponge and needle count were correct.   The patient was awakened and returned to PACU in stable and satisfactory condition. There no complications and the patient tolerated the procedure well.   POSTOPERATIVE PLAN: She will be admitted overnight for pain control.  Hospitalist consult will be obtained.  Plan for discharge home postop day 1. Ok to start DVT ppx POD#1 -Eliquis 5 mg twice daily PT/OT: Nonweightbearing right upper extremity in the sling except for ADLs.  Okay for elbow wrist and hand motion. Outpatient f/u in clinic in 2 weeks for x-rays and clinical checkup.  Renee Harder

## 2022-03-26 NOTE — H&P (Signed)
The patient has been re-examined, and the chart reviewed, and there have been no interval changes to the documented history and physical.    The risks, benefits, and alternatives have been discussed at length, and the patient is willing to proceed with right shoulder rTSA.  Renee Harder

## 2022-03-26 NOTE — Plan of Care (Signed)
  Problem: Activity: Goal: Ability to tolerate increased activity will improve Outcome: Progressing   Problem: Pain Management: Goal: Pain level will decrease with appropriate interventions Outcome: Progressing

## 2022-03-26 NOTE — H&P (Signed)
History and Physical    Stacey [name]:[id number] DOB: [birth year] DOA: 03/26/2022  Referring MD/NP/PA:   PCP: Eulis Foster, MD   Patient coming from:  The patient is coming from home.  At baseline, pt is independent for most of ADL.        Chief Complaint: right shoulder pain  HPI: Stacey [name] is a 81 y.o. female with medical history significant of HTN, HLD, CAD, s/p of CABG, OSA in CPAP, CKD-3a,  A fib on Eliquis, who presents with right shoulder pain.  Pt has chronic persistent right shoulder pain which is consistent with advanced arthritis.  She has failed conservative management including cortisone injections, physical therapy activity modification and elected for surgical intervention. Pt underwent successful right shoulder replacement procedure by Dr. Sharlet Salina of Ortho.  Patient continues to have right shoulder pain, which is moderate to severe, sharp, nonradiating.  Patient has mild dry cough, denies shortness of breath or chest pain.  She has nausea, no vomiting, diarrhea or abdominal pain.  No symptoms of UTI.  Data reviewed independently and ED Course: pt was found to have temperature 98.1, blood pressure 79/60, 106/64, heart rate 106, 71, RR 23, oxygen saturation 89% on room air, BP improved to 91-97% on 2 L oxygen. Pending CBC and BMP. Patient initially placed on telemetry bed for observation.   EKG:  Not done in ED, will get one.    Review of Systems:   General: no fevers, chills, no body weight gain, has fatigue HEENT: no blurry vision, hearing changes or sore throat Respiratory: no dyspnea, has mild dry coughing, no wheezing CV: no chest pain, no palpitations GI: no nausea, vomiting, abdominal pain, diarrhea, constipation GU: no dysuria, burning on urination, increased urinary frequency, hematuria  Ext: has trace leg edema Neuro: no unilateral weakness, numbness, or tingling, no vision change or hearing loss Skin: no rash, no skin tear. MSK:  No muscle spasm, no deformity, no limitation of range of movement in spin. Has right shoulder pain Heme: No easy bruising.  Travel history: No recent long distant travel.   Allergy:  Allergies  Allergen Reactions   Cephalexin Anaphylaxis   Atorvastatin     myalgia   Morphine And Related Hives and Nausea And Vomiting   Other Hives and Other (See Comments)    Muscle pain   Procaine Other (See Comments)    Chest pain Chest pain   Shellfish Allergy Swelling    angioedema   Statins Other (See Comments)    Muscle pain   Tomato Other (See Comments)    (by testing) - also beans, wheat, peas (by testing) - also beans, wheat, peas   Penicillins Hives and Rash    Has patient had a PCN reaction causing immediate rash, facial/tongue/throat swelling, SOB or lightheadedness with hypotension: Yes Has patient had a PCN reaction causing severe rash involving mucus membranes or skin necrosis: No Has patient had a PCN reaction that required hospitalization: No Has patient had a PCN reaction occurring within the last 10 years: Yes If all of the above answers are "NO", then may proceed with Cephalosporin use.    Past Medical History:  Diagnosis Date   Abnormal nuclear stress test 05/24/2017   Anxiety    a.) on BZO (diazepam) PRN   Aortic atherosclerosis (HCC)    Arthritis    Asthma    Atrial fibrillation, transient (HCC)    a.) CHA2DS2VASc = 7 (age x 2, sex, HTN, CVA x 2, prior MI);  b.) rate/rhythm maintained on oral metoprolol tartrate; chronically anticoagulated with apixaban   Benign neoplasm of ascending colon    Benign neoplasm of descending colon    CAD (coronary artery disease)    a.) LHC at [facility] 02/20/2017: 80% oRI-RI, 90% o-pLAD, 95% mLAD, 99% p-mRCA --> urgent CVTS consult; b.) 3v CABG at [facility]; c.) LHC 05/24/2017: 100% o-pLAD, 80% oRI-RI, 100% p-mRCA, SVG-RCA patent, SVG-oD1 with min luminal irregs, LIMA-LAD patent -- med mgmt.   Chronic lower back pain    CKD (chronic kidney  disease) stage 3, GFR 30-59 ml/min (HCC)    Complication of anesthesia    DDD (degenerative disc disease), cervical    Depression    Dyspnea    Family history of adverse reaction to anesthesia    most of family - PONV   Fibromyalgia    GERD (gastroesophageal reflux disease)    Glaucoma    History of 2019 novel coronavirus disease (COVID-19) 02/02/2019   History of bilateral cataract extraction    HTN (hypertension) 03/09/2007   Hyperlipidemia LDL goal <70 03/25/1993   Hypertension    Hypothyroidism    Insomnia    a.) on hypnotic (zolpidem) PRN   Long term current use of anticoagulant    a.) apixaban   Migraines    a.) 1-2x/mo   Motion sickness    all moving vehicles   NSTEMI (non-ST elevated myocardial infarction) ([place]) 02/20/2017   a.) LHC at [facility] 02/20/2017: 80% oRI-RI, 90% o-pLAD, 95% mLAD, 99% p-mRCA --> urgent CVTS consult; b.) 3v CABG at [facility]   OSA on CPAP    PONV (postoperative nausea and vomiting)    S/P CABG x 3    a.) LIMA-LAD, SVG-dRCA, SVG-D1   Squamous cell carcinoma of skin 07/25/2019   Left upper lateral lip above vermillion border. WD SCC with superficial infiltration., MOHs 10/02/19   Stroke ([place]) 10/22/2017   a.) CT/MRI 10/22/2017 - RIGHT occipitoparietal infarct   Vitamin D deficiency     Past Surgical History:  Procedure Laterality Date   ABDOMINAL HYSTERECTOMY     APPENDECTOMY     BLADDER SURGERY     CATARACT EXTRACTION W/ INTRAOCULAR LENS  IMPLANT, BILATERAL     COLONOSCOPY WITH PROPOFOL N/A 01/17/2016   Procedure: COLONOSCOPY WITH PROPOFOL;  Surgeon: [name], MD;  Location: [place];  Service: Endoscopy;  Laterality: N/A;   CORONARY ARTERY BYPASS GRAFT N/A 02/20/2017   Procedure: CORONARY ARTERY BYPASS GRAFTING (CABG) x , three using left internal mammary artery to left anterior descending coronary artery and right greater saphenous vein harvested endoscopically to distal right and diagonal coronary arteries.;  Surgeon: Grace Isaac, MD;  Location: [place];  Service: Open Heart Surgery;  Laterality: N/A;   LEFT HEART CATH AND CORONARY ANGIOGRAPHY N/A 02/20/2017   Procedure: LEFT HEART CATH AND CORONARY ANGIOGRAPHY;  Surgeon: Isaias Cowman, MD;  Location: [place] [facility];  Service: Cardiovascular;  Laterality: N/A;   LEFT HEART CATH AND CORS/GRAFTS ANGIOGRAPHY N/A 05/24/2017   Procedure: LEFT HEART CATH AND CORS/GRAFTS ANGIOGRAPHY;  Surgeon: Sherren Mocha, MD;  Location: [place] [facility];  Service: Cardiovascular;  Laterality: N/A;   NASAL SINUS SURGERY     POLYPECTOMY  01/17/2016   Procedure: POLYPECTOMY;  Surgeon: [name], MD;  Location: [place];  Service: Endoscopy;;   RIGHT OOPHORECTOMY     TEE WITHOUT CARDIOVERSION N/A 02/20/2017   Procedure: TRANSESOPHAGEAL ECHOCARDIOGRAM (TEE);  Surgeon: Grace Isaac, MD;  Location: [place];  Service: Open Heart Surgery;  Laterality: N/A;    Social History:  reports that she quit smoking about 46 years ago. Her smoking use included cigarettes. She has never used smokeless tobacco. She reports that she does not drink alcohol and does not use drugs.  Family History:  Family History  Problem Relation Age of Onset   Alzheimer's disease Mother    Kidney cancer Mother    Heart attack Father    Pancreatic cancer Sister    Heart attack Brother 2   Ovarian cancer Sister    Kidney disease Sister        dialysis   Cancer Sister        uterin   Diabetes Sister    Heart attack Sister    Heart attack Brother 48   Ovarian cancer Sister      Prior to Admission medications   Medication Sig Start Date End Date Taking? Authorizing Provider  albuterol (VENTOLIN HFA) 108 (90 Base) MCG/ACT inhaler INHALE 2 PUFFS BY MOUTH EVERY 4 TO 6 HOURS AS NEEDED FOR SHORTNESS OF BREATH 08/15/18  Yes [name], MD  amLODipine (NORVASC) 5 MG tablet Take 1 tablet (5 mg total) daily by mouth. Patient taking differently: Take 5 mg by mouth at bedtime. 04/04/17   Yes [name], MD  ascorbic acid (VITAMIN C) 500 MG tablet Take 2,000 mg by mouth daily.    Yes [provider]  aspirin 81 MG tablet Take 1 tablet (81 mg total) by mouth daily. 10/24/17  Yes Gladstone Lighter, MD  buPROPion (WELLBUTRIN XL) 300 MG 24 hr tablet Take 1 tablet (300 mg total) by mouth daily. 03/24/22  Yes Simmons-Robinson, Makiera, MD  chlorthalidone (HYGROTON) 25 MG tablet TAKE 1/2 TABLET BY MOUTH DAILY. 06/22/20  Yes [name], MD  Cholecalciferol (VITAMIN D) 2000 units tablet Take 2,000 Units by mouth daily.   Yes [provider]  CRANBERRY PO Take 1 tablet by mouth daily.   Yes [provider]  Cyanocobalamin (B-12) 500 MCG TABS Take 500 mcg by mouth daily.    Yes [provider]  diazepam (VALIUM) 5 MG tablet TAKE ONE TABLET EVERY 12 HOURS AS NEEDEDFOR ANXIETY 02/06/22  Yes [name], MD  ELDERBERRY PO Take by mouth daily. Unsure dose / combination vitamin   Yes [provider]  isosorbide mononitrate (IMDUR) 30 MG 24 hr tablet Take 30 mg by mouth daily. 06/01/20  Yes [provider]  levothyroxine (SYNTHROID) 100 MCG tablet TAKE 1 TABLET BY MOUTH DAILY 12/22/21  Yes [name], MD  losartan (COZAAR) 25 MG tablet Take 25 mg by mouth daily.   Yes [provider]  metoprolol tartrate (LOPRESSOR) 25 MG tablet One and one half tabs twice daily Patient taking differently: Take 25-50 mg by mouth 2 (two) times daily. 50 mg qam and 25 mg qpm 03/26/21  Yes [name], MD  Multiple Vitamin (MULTI-VITAMINS) TABS Take 1 tablet by mouth daily.   Yes [provider]  rosuvastatin (CRESTOR) 20 MG tablet TAKE ONE TABLET (20 MG) BY MOUTH EVERY EVENING 12/04/21  Yes [name], MD  zinc gluconate 50 MG tablet Take 50 mg by mouth daily.    Yes [provider]  zolpidem (AMBIEN) 5 MG tablet TAKE ONE TABLET BY MOUTH AT BEDTIME AS NEEDED FOR SLEEP 11/19/21   Yes [name], MD  acyclovir (ZOVIRAX) 400 MG tablet Take 400  mg by mouth 3 times per day for 5 days to start immediately with each episode of fever blister. May also take one a day for prevention. 01/09/21   Ralene Bathe, MD  apixaban (ELIQUIS) 5 MG TABS tablet Take 1 tablet (5 mg total) by mouth 2 (two) times daily. 10/24/17   Gladstone Lighter, MD  EPINEPHrine (EPIPEN 2-PAK) 0.3 mg/0.3 mL IJ SOAJ injection INJECT AS DIRECTED FOR SEVERE ALLERGIC REACTION Patient taking differently: Inject 0.3 mg into the muscle as needed (for severe allergic reaction). 12/17/16   Mar Daring, PA-C  fluocinonide gel (LIDEX) 4.65 % Apply 1 application topically as needed. 10/22/18   [provider]  mometasone (ELOCON) 0.1 % cream Apply 1 application topically daily as needed (Rash). 02/17/21   Ralene Bathe, MD  oxymetazoline (AFRIN) 0.05 % nasal spray Place 1 spray into both nostrils as needed for congestion.    [provider]  Pyridoxine HCl (VITAMIN B-6 PO) Take 1 tablet by mouth daily.    [provider]  sertraline (ZOLOFT) 25 MG tablet TAKE 1 TABLET BY MOUTH DAILY. SCHEDULE APPT FOR MORE REFILLS 12/29/21   [name], MD  tacrolimus (PROTOPIC) 0.1 % ointment As needed for rosacea flare ups 11/24/18   [provider]  tacrolimus (PROTOPIC) 0.1 % ointment Apply qd to bid qd prn to lips for dryness 01/15/22   Ralene Bathe, MD    Physical Exam: Vitals:   03/26/22 1336 03/26/22 1345 03/26/22 1451 03/26/22 1530  BP:  106/64 99/65 (!) 103/56  Pulse: 68 71 63 94  Resp: '19 13 15 16  '$ Temp:   (!) 97.5 F (36.4 C) 97.6 F (36.4 C)  TempSrc:      SpO2: 92% 91% 96% 97%  Weight:      Height:       General: Not in acute distress HEENT:       Eyes: PERRL, EOMI, no scleral icterus.       ENT: No discharge from the ears and nose, no pharynx injection, no tonsillar enlargement.        Neck: No JVD, no bruit, no mass felt. Heme: No neck  lymph node enlargement. Cardiac: S1/S2, RRR, No murmurs, No gallops or rubs. Respiratory: No rales, wheezing, rhonchi or rubs. GI: Soft, nondistended, nontender, no rebound pain, no organomegaly, BS present. GU: No hematuria Ext: has trace leg edema bilaterally. 1+DP/PT pulse bilaterally. Musculoskeletal: has tenderness in right shoulder (s/p of right shoulder replacement) Skin: No rashes.  Neuro: Alert, oriented X3, cranial nerves II-XII grossly intact, moves all extremities normally.  Labs on Admission: I have personally reviewed following labs and imaging studies  CBC: No results for input(s): "WBC", "NEUTROABS", "HGB", "HCT", "MCV", "PLT" in the last 168 hours. Basic Metabolic Panel: No results for input(s): "NA", "K", "CL", "CO2", "GLUCOSE", "BUN", "CREATININE", "CALCIUM", "MG", "PHOS" in the last 168 hours. GFR: Estimated Creatinine Clearance: 47.9 mL/min (by C-G formula based on SCr of 0.97 mg/dL). Liver Function Tests: No results for input(s): "AST", "ALT", "ALKPHOS", "BILITOT", "PROT", "ALBUMIN" in the last 168 hours. No results for input(s): "LIPASE", "AMYLASE" in the last 168 hours. No results for input(s): "AMMONIA" in the last 168 hours. Coagulation Profile: No results for input(s): "INR", "PROTIME" in the last 168 hours. Cardiac Enzymes: No results for input(s): "CKTOTAL", "CKMB", "CKMBINDEX", "TROPONINI" in the last 168 hours. BNP (last 3 results) No results for input(s): "PROBNP" in the last 8760 hours. HbA1C: No results for input(s): "HGBA1C" in  the last 72 hours. CBG: No results for input(s): "GLUCAP" in the last 168 hours. Lipid Profile: No results for input(s): "CHOL", "HDL", "LDLCALC", "TRIG", "CHOLHDL", "LDLDIRECT" in the last 72 hours. Thyroid Function Tests: No results for input(s): "TSH", "T4TOTAL", "FREET4", "T3FREE", "THYROIDAB" in the last 72 hours. Anemia Panel: No results for input(s): "VITAMINB12", "FOLATE", "FERRITIN", "TIBC", "IRON", "RETICCTPCT"  in the last 72 hours. Urine analysis:    Component Value Date/Time   COLORURINE YELLOW (A) 03/13/2022 1100   APPEARANCEUR CLEAR (A) 03/13/2022 1100   APPEARANCEUR Hazy (A) 08/11/2021 1357   LABSPEC 1.014 03/13/2022 1100   PHURINE 7.0 03/13/2022 1100   GLUCOSEU NEGATIVE 03/13/2022 1100   HGBUR NEGATIVE 03/13/2022 1100   BILIRUBINUR NEGATIVE 03/13/2022 1100   BILIRUBINUR negative 10/17/2021 1805   BILIRUBINUR Negative 08/11/2021 1357   KETONESUR NEGATIVE 03/13/2022 1100   PROTEINUR NEGATIVE 03/13/2022 1100   UROBILINOGEN 0.2 10/17/2021 1805   NITRITE NEGATIVE 03/13/2022 1100   LEUKOCYTESUR MODERATE (A) 03/13/2022 1100   Sepsis Labs: '@LABRCNTIP'$ (procalcitonin:4,lacticidven:4) )No results found for this or any previous visit (from the past 240 hour(s)).   Radiological Exams on Admission: DG Shoulder Right Port  Result Date: 03/26/2022 CLINICAL DATA:  Total shoulder arthroplasty EXAM: RIGHT SHOULDER - 1 VIEW COMPARISON:  None Available. FINDINGS: Interval right shoulder reverse arthroplasty. Postsurgical changes in the surrounding soft tissues. Normal alignment. No acute fracture or dislocation. IMPRESSION: 1. Interval right shoulder reverse arthroplasty. Electronically Signed   By: Kathreen Devoid M.D.   On: 03/26/2022 10:58      Assessment/Plan Principal Problem:   S/P shoulder replacement, right Active Problems:   Degenerative arthritis of right shoulder region   Essential hypertension   PAF (paroxysmal atrial fibrillation) (HCC)   CAD (coronary artery disease)   Hyperlipidemia LDL goal <70   Adult hypothyroidism   CVA (cerebral vascular accident) ([place])   Asthma   Chronic kidney disease, stage 3a ([place])   Depression with anxiety   OSA (obstructive sleep apnea)   Obesity with body mass index of 30.0-39.9   Assessment and Plan:  S/P shoulder replacement, right due to degenerative arthritis of right shoulder region: -place in tele bed for obs -PT/OT -Pain:: As needed  Norco, fentanyl, Tylenol -As needed Robaxin  Essential hypertension: Blood pressure soft, 79/60, 106/64 -Hold home blood pressure medications, including HCTZ, amlodipine, Cozaar and Imdur  -Continue metoprolol -IV hydralazine as needed  PAF (paroxysmal atrial fibrillation) ([place]): -restart Eliquis tomorrow per Dr. Sharlet Salina -Continue metoprolol  CAD (coronary artery disease) -Continue aspirin, Crestor  Hyperlipidemia LDL goal <70 -Crestor  Adult hypothyroidism -Synthroid  CVA (cerebral vascular accident) ([place]) -Aspirin, Crestor  Asthma: No wheezing -Bronchodilators  Chronic kidney disease, stage 3a ([place]): Recent baseline creatinine 0.97 -Pending BMP  Depression with anxiety -Continue home medications  OSA (obstructive sleep apnea) -CPAP  Obesity with body mass index of 30.0-39.9: BMI 34.75, body weight 86.2 kg -Healthy diet -Encourage with losing  2 L new oxygen requirement: Patient is not using oxygen normally.  Patient oxygen desaturating to 89% on room air, which improved to 91-97% on 2 L oxygen.  Patient denies shortness of breath.  Etiology is not clear.  May be related to intubation during surgery. -As needed albuterol and Mucinex -Check COVID PCR - check BNP - f/u Chest x-ray    DVT ppx: SCD  Code Status: Full code  Family Communication:    Yes, patient's  husband at bed side.     Disposition Plan:  Anticipate discharge back to previous environment  Consults called:  Dr. Sharlet Salina of ortho  Admission status and Level of care: Telemetry Medical:   for obs    Dispo: The patient is from: Home              Anticipated d/c is to: Home              Anticipated d/c date is: 1 day              Patient currently is not medically stable to d/c.    Severity of Illness:  The appropriate patient status for this patient is INPATIENT. Inpatient status is judged to be reasonable and necessary in order to provide the required intensity of service to ensure the  patient's safety. The patient's presenting symptoms, physical exam findings, and initial radiographic and laboratory data in the context of their chronic comorbidities is felt to place them at high risk for further clinical deterioration. Furthermore, it is not anticipated that the patient will be medically stable for discharge from the hospital within 2 midnights of admission.   * I certify that at the point of admission it is my clinical judgment that the patient will require inpatient hospital care spanning beyond 2 midnights from the point of admission due to high intensity of service, high risk for further deterioration and high frequency of surveillance required.*       Date of Service 03/26/2022    Ivor [facility]   If 7PM-7AM, please contact night-coverage [email or web address] 03/26/2022, 7:17 PM

## 2022-03-26 NOTE — Progress Notes (Signed)
  Subjective:  Patient is sitting in bedside chair.  She is comfortable and complaining of no pain.  Objective:   VITALS:   Vitals:   03/26/22 1336 03/26/22 1345 03/26/22 1451 03/26/22 1530  BP:  106/64 99/65 (!) 103/56  Pulse: 68 71 63 94  Resp: '19 13 15 16  '$ Temp:   (!) 97.5 F (36.4 C) 97.6 F (36.4 C)  TempSrc:      SpO2: 92% 91% 96% 97%  Weight:      Height:        PHYSICAL EXAM:  General: Alert, comfortable Right upper extremity: Dressing is clean dry and intact, grossly motor and sensory intact  LABS  No results found for this or any previous visit (from the past 24 hour(s)).  DG Shoulder Right Port  Result Date: 03/26/2022 CLINICAL DATA:  Total shoulder arthroplasty EXAM: RIGHT SHOULDER - 1 VIEW COMPARISON:  None Available. FINDINGS: Interval right shoulder reverse arthroplasty. Postsurgical changes in the surrounding soft tissues. Normal alignment. No acute fracture or dislocation. IMPRESSION: 1. Interval right shoulder reverse arthroplasty. Electronically Signed   By: Kathreen Devoid M.D.   On: 03/26/2022 10:58    Assessment/Plan: Day of Surgery   Status post right shoulder RTSA  Principal Problem:   S/P shoulder replacement, right Active Problems:   Essential hypertension   Hyperlipidemia LDL goal <70   Adult hypothyroidism   PAF (paroxysmal atrial fibrillation) (HCC)   CVA (cerebral vascular accident) ([place])   OSA (obstructive sleep apnea)   Degenerative arthritis of right shoulder region   CAD (coronary artery disease)   Asthma   Chronic kidney disease, stage 3a ([place])   Depression with anxiety   Obesity with body mass index of 30.0-39.9  -Appreciate hospitalist medical management -PT/OT: Nonweightbearing in sling, okay for hand wrist and elbow motion -Multimodal pain control -Okay to restart Eliquis on postop day 1 -Plan for discharge home on postop day 1  Renee Harder , MD 03/26/2022, 6:07 PM

## 2022-03-26 NOTE — Anesthesia Procedure Notes (Signed)
Procedure Name: Intubation Date/Time: 03/26/2022 7:37 AM  Performed by: Lily Peer, Chloe Bluett, CRNAPre-anesthesia Checklist: Patient identified, Emergency Drugs available, Suction available and Patient being monitored Patient Re-evaluated:Patient Re-evaluated prior to induction Oxygen Delivery Method: Circle system utilized Preoxygenation: Pre-oxygenation with 100% oxygen Induction Type: IV induction Ventilation: Mask ventilation without difficulty Laryngoscope Size: McGraph and 3 Grade View: Grade I Tube type: Oral Tube size: 6.5 mm Number of attempts: 1 Airway Equipment and Method: Stylet and Oral airway Placement Confirmation: ETT inserted through vocal cords under direct vision, positive ETCO2 and breath sounds checked- equal and bilateral Secured at: 19 cm Tube secured with: Tape Dental Injury: Teeth and Oropharynx as per pre-operative assessment

## 2022-03-26 NOTE — Discharge Instructions (Signed)
Orthopedic discharge instructions: Patient has had a right reverse total shoulder arthroplasty Patient should remain in the sling for approximately 6 weeks postoperatively.  She can come out for elbow wrist and hand motion.  PT will start next week on 11/6 Aquacel dressing should remain in place DVT prophylaxis includes home Eliquis Pain medication was prescribed at patient's preop visit to include Norco.  Patient should also take Tylenol up to 3000 mg per 24-hour.Marland Kitchen  Zofran was also prescribed for postop nausea Follow up with Dr. Minda Meo PA, Murilo Zanette PA-C at [employer], [place] office in 10-14 days for wound check, staple removal and x-ray.

## 2022-03-26 NOTE — Anesthesia Preprocedure Evaluation (Addendum)
Anesthesia Evaluation  Patient identified by MRN, date of birth, ID band Patient awake    Reviewed: Allergy & Precautions, NPO status , Patient's Chart, lab work & pertinent test results  History of Anesthesia Complications (+) PONV and history of anesthetic complications  Airway Mallampati: III  TM Distance: >3 FB Neck ROM: full    Dental  (+) Chipped, Teeth Intact   Pulmonary shortness of breath and with exertion, asthma , sleep apnea , COPD,  COPD inhaler, former smoker   Pulmonary exam normal        Cardiovascular hypertension, On Medications and On Home Beta Blockers (-) angina + CAD, + Past MI, + CABG and + DOE  + dysrhythmias Atrial Fibrillation  Rhythm:Irregular Rate:Normal     Neuro/Psych  Headaches PSYCHIATRIC DISORDERS Anxiety Depression     Neuromuscular disease CVA, No Residual Symptoms    GI/Hepatic Neg liver ROS,GERD  ,,  Endo/Other  Hypothyroidism    Renal/GU Renal diseaseCKD     Musculoskeletal  (+) Arthritis ,  Fibromyalgia -  Abdominal   Peds  Hematology negative hematology ROS (+)   Anesthesia Other Findings Past Medical History: 05/24/2017: Abnormal nuclear stress test No date: Anxiety     Comment:  a.) on BZO (diazepam) PRN No date: Aortic atherosclerosis (HCC) No date: Arthritis No date: Asthma No date: Atrial fibrillation, transient (HCC)     Comment:  a.) CHA2DS2VASc = 7 (age x 2, sex, HTN, CVA x 2, prior               MI);  b.) rate/rhythm maintained on oral metoprolol               tartrate; chronically anticoagulated with apixaban No date: Benign neoplasm of ascending colon No date: Benign neoplasm of descending colon No date: CAD (coronary artery disease)     Comment:  a.) LHC at [facility] 02/20/2017: 80% oRI-RI, 90% o-pLAD, 95%               mLAD, 99% p-mRCA --> urgent CVTS consult; b.) 3v CABG at               [facility]; c.) LHC 05/24/2017: 100% o-pLAD, 80% oRI-RI, 100%                p-mRCA, SVG-RCA patent, SVG-oD1 with min luminal irregs,               LIMA-LAD patent -- med mgmt. No date: Chronic lower back pain No date: CKD (chronic kidney disease) stage 3, GFR 30-59 ml/min (HCC) No date: Complication of anesthesia No date: DDD (degenerative disc disease), cervical No date: Depression No date: Dyspnea No date: Family history of adverse reaction to anesthesia     Comment:  most of family - PONV No date: Fibromyalgia No date: GERD (gastroesophageal reflux disease) No date: Glaucoma 02/02/2019: History of 2019 novel coronavirus disease (COVID-19) No date: History of bilateral cataract extraction 03/09/2007: HTN (hypertension) 03/25/1993: Hyperlipidemia LDL goal <70 No date: Hypertension No date: Hypothyroidism No date: Insomnia     Comment:  a.) on hypnotic (zolpidem) PRN No date: Long term current use of anticoagulant     Comment:  a.) apixaban No date: Migraines     Comment:  a.) 1-2x/mo No date: Motion sickness     Comment:  all moving vehicles 02/20/2017: NSTEMI (non-ST elevated myocardial infarction) ([place])     Comment:  a.) LHC at [facility] 02/20/2017: 80% oRI-RI, 90% o-pLAD, 95%  mLAD, 99% p-mRCA --> urgent CVTS consult; b.) 3v CABG at               [facility] No date: OSA on CPAP No date: PONV (postoperative nausea and vomiting) No date: S/P CABG x 3     Comment:  a.) LIMA-LAD, SVG-dRCA, SVG-D1 07/25/2019: Squamous cell carcinoma of skin     Comment:  Left upper lateral lip above vermillion border. WD SCC               with superficial infiltration., MOHs 10/02/19 10/22/2017: Stroke ([place])     Comment:  a.) CT/MRI 10/22/2017 - RIGHT occipitoparietal infarct No date: Vitamin D deficiency  Past Surgical History: No date: ABDOMINAL HYSTERECTOMY No date: APPENDECTOMY No date: BLADDER SURGERY No date: CATARACT EXTRACTION W/ INTRAOCULAR LENS  IMPLANT, BILATERAL 01/17/2016: COLONOSCOPY WITH PROPOFOL; N/A     Comment:  Procedure: COLONOSCOPY WITH  PROPOFOL;  Surgeon: [name], MD;  Location: [place];  Service:               Endoscopy;  Laterality: N/A; 02/20/2017: CORONARY ARTERY BYPASS GRAFT; N/A     Comment:  Procedure: CORONARY ARTERY BYPASS GRAFTING (CABG) x ,               three using left internal mammary artery to left anterior              descending coronary artery and right greater saphenous               vein harvested endoscopically to distal right and               diagonal coronary arteries.;  Surgeon: Grace Isaac, MD;  Location: [place];  Service: Open Heart Surgery;                Laterality: N/A; 02/20/2017: LEFT HEART CATH AND CORONARY ANGIOGRAPHY; N/A     Comment:  Procedure: LEFT HEART CATH AND CORONARY ANGIOGRAPHY;                Surgeon: Isaias Cowman, MD;  Location: [place] CV LAB;  Service: Cardiovascular;  Laterality:               N/A; 05/24/2017: LEFT HEART CATH AND CORS/GRAFTS ANGIOGRAPHY; N/A     Comment:  Procedure: LEFT HEART CATH AND CORS/GRAFTS ANGIOGRAPHY;               Surgeon: Sherren Mocha, MD;  Location: [place] CV               LAB;  Service: Cardiovascular;  Laterality: N/A; No date: NASAL SINUS SURGERY 01/17/2016: POLYPECTOMY     Comment:  Procedure: POLYPECTOMY;  Surgeon: [name], MD;                Location: [place];  Service: Endoscopy;; No date: RIGHT OOPHORECTOMY 02/20/2017: TEE WITHOUT CARDIOVERSION; N/A     Comment:  Procedure: TRANSESOPHAGEAL ECHOCARDIOGRAM (TEE);                Surgeon: Grace Isaac, MD;  Location: [place];                Service: Open Heart Surgery;  Laterality: N/A;  BMI    Body Mass Index: 34.75 kg/m      Reproductive/Obstetrics negative OB ROS                             Anesthesia Physical Anesthesia Plan  ASA: 3  Anesthesia Plan: General ETT   Post-op Pain Management: Tylenol PO (pre-op)*, Ketamine IV* and Dilaudid IV    Induction: Intravenous  PONV Risk Score and Plan: 4 or greater and Ondansetron, Dexamethasone and Treatment may vary due to age or medical condition  Airway Management Planned: Oral ETT  Additional Equipment:   Intra-op Plan:   Post-operative Plan: Extubation in OR  Informed Consent: I have reviewed the patients History and Physical, chart, labs and discussed the procedure including the risks, benefits and alternatives for the proposed anesthesia with the patient or authorized representative who has indicated his/her understanding and acceptance.     Dental Advisory Given  Plan Discussed with: Anesthesiologist, CRNA and Surgeon  Anesthesia Plan Comments: (Patient consented for risks of anesthesia including but not limited to:  - adverse reactions to medications - damage to eyes, teeth, lips or other oral mucosa - nerve damage due to positioning  - sore throat or hoarseness - Damage to heart, brain, nerves, lungs, other parts of body or loss of life  Patient voiced understanding.)        Anesthesia Quick Evaluation

## 2022-03-26 NOTE — Transfer of Care (Signed)
Immediate Anesthesia Transfer of Care Note  Patient: Stacey [name]  Procedure(s) Performed: TOTAL SHOULDER ARTHROPLASTY- reverse (Right: Shoulder)  Patient Location: PACU  Anesthesia Type:General  Level of Consciousness: awake, alert , and oriented  Airway & Oxygen Therapy: Patient Spontanous Breathing and Patient connected to face mask oxygen  Post-op Assessment: Report given to RN and Post -op Vital signs reviewed and stable  Post vital signs: Reviewed and stable  Last Vitals:  Vitals Value Taken Time  BP 99/53 03/26/22 1025  Temp    Pulse 64 03/26/22 1026  Resp 23 03/26/22 1026  SpO2 93 % 03/26/22 1026  Vitals shown include unvalidated device data.  Last Pain:  Vitals:   03/26/22 0636  TempSrc: Temporal  PainSc: 0-No pain         Complications: No notable events documented.

## 2022-03-26 NOTE — Anesthesia Postprocedure Evaluation (Signed)
Anesthesia Post Note  Patient: Stacey [name]  Procedure(s) Performed: TOTAL SHOULDER ARTHROPLASTY- reverse (Right: Shoulder)  Patient location during evaluation: PACU Anesthesia Type: General Level of consciousness: awake and alert Pain management: pain level controlled Vital Signs Assessment: post-procedure vital signs reviewed and stable Respiratory status: spontaneous breathing, nonlabored ventilation, respiratory function stable and patient connected to nasal cannula oxygen Cardiovascular status: blood pressure returned to baseline and stable Postop Assessment: no apparent nausea or vomiting Anesthetic complications: no   No notable events documented.   Last Vitals:  Vitals:   03/26/22 1100 03/26/22 1101  BP: (!) 79/60 93/61  Pulse: 67 63  Resp: 14 15  Temp:    SpO2: 95% 92%    Last Pain:  Vitals:   03/26/22 1045  TempSrc:   PainSc: 5                  Ilene Qua

## 2022-03-26 NOTE — Evaluation (Signed)
Physical Therapy Evaluation Patient Details Name: Stacey [name] MRN: [id number] DOB: [birth year] Today's Date: 03/26/2022  History of Present Illness  81 yo female s/p reverse R TSA on 03/26/22. PMHx includes COPD, HTN, CAD, past MI, s/p CABG x3, anxiety, Afib, depression, DDD (cervical), CKD, fibromyalgia, GERD, glaucoma, CVA (09/2017), and HLD.  Clinical Impression  Prior to surgery, pt was requiring assist (from her husband) for bed mobility and transfers but able to ambulate without any DME use; lives with her husband who can assist for the next 3 weeks.  Currently pt is min assist with transfers and ambulation up to 20 feet with hand hold assist.  Pain 8/10 R shoulder during session (pt received recent pain meds; nurse notified of pt's pain).  Pt would benefit from skilled PT to address noted impairments and functional limitations (see below for any additional details).  Upon hospital discharge, pt would benefit from HHPT with transition to OP PT; pt's husband plans to assist pt for the next 3 weeks (before going back to work).    Recommendations for follow up therapy are one component of a multi-disciplinary discharge planning process, led by the attending physician.  Recommendations may be updated based on patient status, additional functional criteria and insurance authorization.  Follow Up Recommendations Home health PT (plan for OP PT starting Nov 13th)      Assistance Recommended at Discharge Intermittent Supervision/Assistance  Patient can return home with the following  A little help with walking and/or transfers;A little help with bathing/dressing/bathroom;Assistance with cooking/housework;Assist for transportation;Help with stairs or ramp for entrance    Equipment Recommendations Cane  Recommendations for Other Services       Functional Status Assessment Patient has had a recent decline in their functional status and demonstrates the ability to make significant improvements  in function in a reasonable and predictable amount of time.     Precautions / Restrictions Precautions Precautions: Shoulder;Fall Shoulder Interventions: Shoulder sling/immobilizer;Don joy ultra sling;Shoulder abduction pillow;At all times;Off for dressing/bathing/exercises Precaution Booklet Issued: Yes (comment) Restrictions Weight Bearing Restrictions: Yes RUE Weight Bearing: Non weight bearing Other Position/Activity Restrictions: wrist/hand ROM ok      Mobility  Bed Mobility               General bed mobility comments: Deferred (pt sitting on edge of bed with OT present upon PT arrival)    Transfers Overall transfer level: Needs assistance Equipment used: 1 person hand held assist Transfers: Sit to/from Stand Sit to Stand: Min assist           General transfer comment: x1 trial from bed and x1 trial from [facility] over toilet; assist to initiate stand; wider BOS    Ambulation/Gait Ambulation/Gait assistance: Min assist Gait Distance (Feet):  (10 feet; 20 feet) Assistive device: 1 person hand held assist   Gait velocity: decreased     General Gait Details: wider BOS; decreased B LE step length; increased B lateral sway  Stairs            Wheelchair Mobility    Modified Rankin (Stroke Patients Only)       Balance Overall balance assessment: Needs assistance Sitting-balance support: No upper extremity supported, Feet supported Sitting balance-Leahy Scale: Good Sitting balance - Comments: steady sitting reaching within BOS with L UE   Standing balance support: Single extremity supported, During functional activity Standing balance-Leahy Scale: Fair Standing balance comment: requires UE support for balance ambulating  Pertinent Vitals/Pain Pain Assessment Pain Assessment: 0-10 Pain Score: 8  Pain Location: R shoulder Pain Descriptors / Indicators: Aching, Throbbing Pain Intervention(s): Limited activity  within patient's tolerance, Monitored during session, Premedicated before session, Repositioned, Other (comment) (polar care applied; RN notified of pt's pain) O2 sats 91% on room air with activity but 96% on 2 L O2 at rest (end of session).    Home Living Family/patient expects to be discharged to:: Private residence Living Arrangements: Spouse/significant other Available Help at Discharge: Family;Available 24 hours/day Type of Home: House Home Access: Stairs to enter   [employer] of Steps: 3 STE-- B rails from front and freezer on L side in garage to hold onto   Home Layout: Two level;1/2 bath on main level;Bed/bath upstairs   Additional Comments: Pt plans to sleep in recliner on main floor.    Prior Function Prior Level of Function : Driving;Independent/Modified Independent             Mobility Comments: For the last 6 months, pt's husband has been assisting pt with bed mobility and transfers d/t R UE pain/issues.  No AD use with ambulation. ADLs Comments: Per OT eval: "mod indep with ADL 2/2 R shoulder pain"     Hand Dominance   Dominant Hand: Right    Extremity/Trunk Assessment   Upper Extremity Assessment Upper Extremity Assessment: Defer to OT evaluation RUE Deficits / Details: Per OT eval "sensation intact, able to move wrist/hand well" RUE: Unable to fully assess due to pain;Unable to fully assess due to immobilization RUE Sensation: WNL RUE Coordination: decreased fine motor;decreased gross motor    Lower Extremity Assessment Lower Extremity Assessment: Generalized weakness    Cervical / Trunk Assessment Cervical / Trunk Assessment: Other exceptions Cervical / Trunk Exceptions: forward head/shoulders  Communication   Communication: No difficulties  Cognition Arousal/Alertness: Awake/alert Behavior During Therapy: WFL for tasks assessed/performed Overall Cognitive Status: Within Functional Limits for tasks assessed                                  General Comments: Slightly increased processing time.        General Comments     Exercises     Assessment/Plan    PT Assessment Patient needs continued PT services  PT Problem List Decreased strength;Decreased range of motion;Decreased activity tolerance;Decreased balance;Decreased mobility;Decreased knowledge of use of DME;Decreased knowledge of precautions;Pain;Decreased skin integrity       PT Treatment Interventions DME instruction;Gait training;Stair training;Functional mobility training;Therapeutic activities;Therapeutic exercise;Balance training;Patient/family education    PT Goals (Current goals can be found in the Care Plan section)  Acute Rehab PT Goals Patient Stated Goal: to improve mobility and pain PT Goal Formulation: With patient/family Time For Goal Achievement: 04/09/22 Potential to Achieve Goals: Good    Frequency BID     Co-evaluation               AM-PAC PT "6 Clicks" Mobility  Outcome Measure Help needed turning from your back to your side while in a flat bed without using bedrails?: A Little Help needed moving from lying on your back to sitting on the side of a flat bed without using bedrails?: A Little Help needed moving to and from a bed to a chair (including a wheelchair)?: A Little Help needed standing up from a chair using your arms (e.g., wheelchair or bedside chair)?: A Little Help needed to walk in hospital room?: A Little  Help needed climbing 3-5 steps with a railing? : A Little 6 Click Score: 18    End of Session Equipment Utilized During Treatment: Gait belt;Other (comment) (R UE sling/immobilizer) Activity Tolerance: Patient limited by fatigue Patient left: in chair;with call bell/phone within reach;with chair alarm set;with family/visitor present;with SCD's reapplied;Other (comment) (polar care in place) Nurse Communication: Mobility status;Precautions;Weight bearing status;Other (comment) (pt's pain  status) PT Visit Diagnosis: Unsteadiness on feet (R26.81);Muscle weakness (generalized) (M62.81);Pain Pain - Right/Left: Right Pain - part of body: Shoulder    Time: 1641-1711 PT Time Calculation (min) (ACUTE ONLY): 30 min   Charges:   PT Evaluation $PT Eval Low Complexity: 1 Low PT Treatments $Gait Training: 8-22 mins        Leitha Bleak, PT 03/26/22, 6:02 PM

## 2022-03-26 NOTE — Evaluation (Signed)
Occupational Therapy Evaluation Patient Details Name: Stacey [name] MRN: [id number] DOB: [birth year] Today's Date: 03/26/2022   History of Present Illness 81yo female s/p reverse R TSA on 03/26/22. PMHx includes COPD, HTN, CAD, past MI, s/p CABG x3, anxiety, Aib, depression, DDD (cervical), CKD, fibromyalgia, GERD, glaucoma, CVA (09/2017), and HLD.   Clinical Impression   Patient was seen for an OT evaluation this date. Pt lives with her spouse in a 2 story home with bed/bath upstairs, 1/2 bath on main fl and plan to sleep in recliner initially on 1st fl upon return home. Prior to surgery, pt was able to complete ADL with mod indep, assist from spouse for ADL transfers, and once up on her feet was able to ambulate without assist. Pt has orders for RUE to be immobilized and will be NWBing per MD. Patient presents with impaired strength/ROM, pain, balance, and activity tolerance. These impairments result in a decreased ability to perform self care tasks requiring MOD A for LB ADL, MOD-MAX A for UB ADL, and MAX A for application of polar care, compression stockings, and sling/immobilizer. Pt/spouse instructed in polar care mgt, sling/immobilizer mgt, RUE precautions, adaptive strategies for bathing/dressing/toileting/grooming, and home/routines modifications to maximize falls prevention, safety, and independence. Handout provided. OT adjusted sling/immobilizer and polar care to improve comfort, optimize positioning, and to maximize skin integrity/safety. Pt endorsed improved comfort afterwards. Pt/spouse verbalized understanding of all education/training provided however voiced desire for additional sling/polar care training prior to discharge. Will plan to see for OT tomorrow morning for additional training session. Pt will benefit from skilled OT services to address these limitations and improve independence in daily tasks. Recommend HHOT services to continue therapy to maximize return to PLOF, address  home/routines modifications and safety, minimize falls risk, and minimize caregiver burden prior to initiation of follow up therapy as arranged by the surgeon for her shoulder.   Recommendations for follow up therapy are one component of a multi-disciplinary discharge planning process, led by the attending physician.  Recommendations may be updated based on patient status, additional functional criteria and insurance authorization.   Follow Up Recommendations  Home health OT (HHOT prior to start of outpatient therapy as arranged by surgeon)    Assistance Recommended at Discharge Intermittent Supervision/Assistance  Patient can return home with the following A little help with walking and/or transfers;A lot of help with bathing/dressing/bathroom;Assistance with cooking/housework;Assist for transportation;Help with stairs or ramp for entrance    Functional Status Assessment  Patient has had a recent decline in their functional status and demonstrates the ability to make significant improvements in function in a reasonable and predictable amount of time.  Equipment Recommendations  BSC/3in1    Recommendations for Other Services       Precautions / Restrictions Precautions Precautions: Shoulder;Fall Shoulder Interventions: Shoulder sling/immobilizer;Don joy ultra sling;Shoulder abduction pillow;At all times;Off for dressing/bathing/exercises Precaution Booklet Issued: Yes (comment) Restrictions Weight Bearing Restrictions: Yes RUE Weight Bearing: Non weight bearing Other Position/Activity Restrictions: wrist/hand ROM ok      Mobility Bed Mobility Overal bed mobility: Needs Assistance Bed Mobility: Supine to Sit     Supine to sit: Supervision          Transfers                   General transfer comment: deferred, PT in room to assess      Balance Overall balance assessment: Needs assistance Sitting-balance support: No upper extremity supported, Feet  supported Sitting balance-Leahy Scale: Good  ADL either performed or assessed with clinical judgement   ADL                                         General ADL Comments: Pt requires MOD A for LB ADL tasks and MOD-MAX A for UB ADL 2/2 RUE impairments. Spouse able to provide needed level of assist.     Vision         Perception     Praxis      Pertinent Vitals/Pain Pain Assessment Pain Assessment: 0-10 Pain Score: 10-Worst pain ever Pain Location: 7/10 at rest, 10/10 after repositioning of sling/polar care Pain Descriptors / Indicators: Aching, Throbbing Pain Intervention(s): Limited activity within patient's tolerance, Monitored during session, Repositioned, Patient requesting pain meds-RN notified, RN gave pain meds during session, Ice applied     Hand Dominance Right   Extremity/Trunk Assessment Upper Extremity Assessment Upper Extremity Assessment: RUE deficits/detail RUE Deficits / Details: sensation intact, able to move wrist/hand well RUE: Unable to fully assess due to pain;Unable to fully assess due to immobilization RUE Sensation: WNL RUE Coordination: decreased fine motor;decreased gross motor   Lower Extremity Assessment Lower Extremity Assessment: Defer to PT evaluation       Communication Communication Communication: No difficulties   Cognition Arousal/Alertness: Awake/alert, Suspect due to medications Behavior During Therapy: WFL for tasks assessed/performed Overall Cognitive Status: Within Functional Limits for tasks assessed                                 General Comments: slightly increased processing time     General Comments  96-97% SpO2 maintained from 2L to RA    Exercises Other Exercises Other Exercises: Pt/spouse educated in polar care mgt, shoulder sling/immobilizer mgt, positioning, RUE precautions, hemi techniques for UB ADL, falls prevention. Handout  provided to support recall and carryover.   Shoulder Instructions      Home Living Family/patient expects to be discharged to:: Private residence Living Arrangements: Spouse/significant other Available Help at Discharge: Family;Available 24 hours/day Type of Home: House Home Access: Stairs to enter [employer] of Steps: 3 STE, bilat rails from front and freezer on L side in garage to hold onto   Home Layout: Two level;1/2 bath on main level;Bed/bath upstairs     Bathroom Shower/Tub: Teacher, early years/pre: Standard         Additional Comments: Spouse reports they have a recliner downstairs that the pt can sleep in      Prior Functioning/Environment Prior Level of Function : Driving;Independent/Modified Independent               ADLs Comments: mod indep with ADL 2/2 R shoulder pain        OT Problem List: Decreased strength;Pain;Decreased range of motion;Impaired balance (sitting and/or standing);Decreased knowledge of use of DME or AE;Impaired UE functional use;Decreased knowledge of precautions      OT Treatment/Interventions: Self-care/ADL training;Therapeutic exercise;Therapeutic activities;DME and/or AE instruction;Patient/family education;Balance training    OT Goals(Current goals can be found in the care plan section) Acute Rehab OT Goals Patient Stated Goal: have less pain OT Goal Formulation: With patient/family Time For Goal Achievement: 04/09/22 Potential to Achieve Goals: Good ADL Goals Pt Will Perform Upper Body Dressing: with mod assist;sitting;with caregiver independent in assisting;with min assist Pt Will Perform Lower Body Dressing: with  min assist;with caregiver independent in assisting;sit to/from stand Additional ADL Goal #1: Pt will indep instruct family in polar care mgt Additional ADL Goal #2: Pt will indep instruct family in shoulder sling/immobilizer mgt  OT Frequency: Min 2X/week    Co-evaluation               AM-PAC OT "6 Clicks" Daily Activity     Outcome Measure Help from another person eating meals?: None Help from another person taking care of personal grooming?: A Little Help from another person toileting, which includes using toliet, bedpan, or urinal?: A Little Help from another person bathing (including washing, rinsing, drying)?: A Lot Help from another person to put on and taking off regular upper body clothing?: A Lot Help from another person to put on and taking off regular lower body clothing?: A Lot 6 Click Score: 16   End of Session Equipment Utilized During Treatment: Oxygen Nurse Communication: Patient requests pain meds  Activity Tolerance: Patient tolerated treatment well Patient left: in bed;with call bell/phone within reach;with family/visitor present;Other (comment) (PT in room to assess)  OT Visit Diagnosis: Other abnormalities of gait and mobility (R26.89);Pain Pain - Right/Left: Right Pain - part of body: Shoulder                Time: 1557-1640 OT Time Calculation (min): 43 min Charges:  OT General Charges $OT Visit: 1 Visit OT Evaluation $OT Eval Moderate Complexity: 1 Mod OT Treatments $Self Care/Home Management : 23-37 mins  Ardeth Perfect., MPH, MS, OTR/L ascom [phone] 03/26/22, 5:25 PM

## 2022-03-27 ENCOUNTER — Encounter: Payer: Self-pay | Admitting: Orthopaedic Surgery

## 2022-03-27 DIAGNOSIS — Z96611 Presence of right artificial shoulder joint: Secondary | ICD-10-CM | POA: Diagnosis not present

## 2022-03-27 DIAGNOSIS — M19011 Primary osteoarthritis, right shoulder: Secondary | ICD-10-CM | POA: Diagnosis not present

## 2022-03-27 LAB — BASIC METABOLIC PANEL
Anion gap: 9 (ref 5–15)
BUN: 16 mg/dL (ref 8–23)
CO2: 27 mmol/L (ref 22–32)
Calcium: 8.9 mg/dL (ref 8.9–10.3)
Chloride: 96 mmol/L — ABNORMAL LOW (ref 98–111)
Creatinine, Ser: 0.92 mg/dL (ref 0.44–1.00)
GFR, Estimated: >60 mL/min (ref >60)
Glucose, Bld: 170 mg/dL — ABNORMAL HIGH (ref 70–99)
Potassium: 4 mmol/L (ref 3.5–5.1)
Sodium: 132 mmol/L — ABNORMAL LOW (ref 135–145)

## 2022-03-27 LAB — SARS CORONAVIRUS 2 BY RT PCR: SARS Coronavirus 2 by RT PCR: NEGATIVE

## 2022-03-27 LAB — CBC
HCT: 36.7 % (ref 36.0–46.0)
Hemoglobin: 12.3 g/dL (ref 12.0–15.0)
MCH: 29.4 pg (ref 26.0–34.0)
MCHC: 33.5 g/dL (ref 30.0–36.0)
MCV: 87.8 fL (ref 80.0–100.0)
Platelets: 164 10*3/uL (ref 150–400)
RBC: 4.18 MIL/uL (ref 3.87–5.11)
RDW: 12.7 % (ref 11.5–15.5)
WBC: 12.2 10*3/uL — ABNORMAL HIGH (ref 4.0–10.5)
nRBC: 0 % (ref 0.0–0.2)

## 2022-03-27 LAB — SURGICAL PATHOLOGY

## 2022-03-27 MED ORDER — METHOCARBAMOL 500 MG PO TABS: 500.0000 mg | ORAL_TABLET | Freq: Three times a day (TID) | ORAL | 0 refills | Status: AC | PRN

## 2022-03-27 MED ORDER — HYDROCODONE-ACETAMINOPHEN 5-325 MG PO TABS: 1.0000 | ORAL_TABLET | Freq: Four times a day (QID) | ORAL | 0 refills | Status: AC | PRN

## 2022-03-27 MED ORDER — DM-GUAIFENESIN ER 30-600 MG PO TB12: 1.0000 | ORAL_TABLET | Freq: Two times a day (BID) | ORAL | Status: AC | PRN

## 2022-03-27 MED ORDER — MENTHOL 3 MG MT LOZG: 1.0000 | LOZENGE | OROMUCOSAL | Status: AC | PRN

## 2022-03-27 NOTE — Plan of Care (Signed)

## 2022-03-27 NOTE — Progress Notes (Signed)
Physical Therapy Treatment Patient Details Name: Stacey [name] MRN: [id number] DOB: 08/06/1940 Today's Date: 03/27/2022   History of Present Illness 81 yo female s/p reverse R TSA on 03/26/22. PMHx includes COPD, HTN, CAD, past MI, s/p CABG x3, anxiety, Afib, depression, DDD (cervical), CKD, fibromyalgia, GERD, glaucoma, CVA (09/2017), and HLD.    PT Comments    Pt resting in bed upon PT arrival; agreeable to therapy.  During session pt SBA semi-supine to sitting edge of bed; SBA with transfers; CGA to SBA ambulating up to 160 feet (no AD use); and CGA navigating 4 steps with single UE support.  Pt reporting SOB at times but overall tolerated sessions activities fairly well (O2 sats 89% on room air at rest in bed beginning of session; O2 sats 92-93% on room air with activity).  Reviewed home safety and safe car transfer technique: pt and pt's husband verbalizing appropriate understanding.  Pt appears safe to discharge home when medically appropriate (MD's and nursing notified of this and pt's O2 sats during session).    Recommendations for follow up therapy are one component of a multi-disciplinary discharge planning process, led by the attending physician.  Recommendations may be updated based on patient status, additional functional criteria and insurance authorization.  Follow Up Recommendations  Outpatient PT (plan for OP PT starting Nov 13th)     Assistance Recommended at Discharge Intermittent Supervision/Assistance  Patient can return home with the following A little help with walking and/or transfers;A little help with bathing/dressing/bathroom;Assistance with cooking/housework;Assist for transportation;Help with stairs or ramp for entrance   Equipment Recommendations  BSC/3in1    Recommendations for Other Services       Precautions / Restrictions Precautions Precautions: Shoulder;Fall Type of Shoulder Precautions: sling at all times besides ADLs; NWB RUE Shoulder  Interventions: Shoulder sling/immobilizer;Don joy ultra sling;Shoulder abduction pillow;At all times;Off for dressing/bathing/exercises Restrictions Weight Bearing Restrictions: Yes RUE Weight Bearing: Non weight bearing Other Position/Activity Restrictions: wrist/hand ROM ok     Mobility  Bed Mobility Overal bed mobility: Needs Assistance Bed Mobility: Supine to Sit     Supine to sit: Supervision, HOB elevated     General bed mobility comments: increased effort to perform on own with HOB elevated (pt reports planning on sleeping in recliner at home and pt's husband will assist with leg rest d/t lever on R side)    Transfers Overall transfer level: Needs assistance Equipment used: None Transfers: Sit to/from Stand Sit to Stand: Supervision           General transfer comment: x1 trial from bed and x2 trials from recliner; wider BOS; mild increased effort to stand but steady    Ambulation/Gait Ambulation/Gait assistance: Min guard, Supervision Gait Distance (Feet):  (30 feet; 20 feet; 160 feet) Assistive device: None Gait Pattern/deviations: Step-through pattern Gait velocity: decreased     General Gait Details: mild increased BOS; mild increased B lateral sway   Stairs Stairs: Yes Stairs assistance: Min guard Stair Management: One rail Left, Step to pattern, Forwards Number of Stairs: 4 General stair comments: steady safe stairs navigation with single UE support (pt's husband plans to assist pt at home on stairs)   Wheelchair Mobility    Modified Rankin (Stroke Patients Only)       Balance Overall balance assessment: Needs assistance Sitting-balance support: No upper extremity supported, Feet supported Sitting balance-Leahy Scale: Good Sitting balance - Comments: steady sitting reaching within BOS with L UE   Standing balance support: No upper extremity supported, During functional  activity Standing balance-Leahy Scale: Good Standing balance comment:  no loss of balance noted with ambulation                            Cognition Arousal/Alertness: Awake/alert Behavior During Therapy: WFL for tasks assessed/performed Overall Cognitive Status: Within Functional Limits for tasks assessed                                          Exercises      General Comments  Nursing cleared pt for participation in physical therapy.  Pt agreeable to PT session.  Pt's husband and family present during session.      Pertinent Vitals/Pain Pain Assessment Pain Assessment: 0-10 Pain Score: 7  Pain Location: R shoulder Pain Descriptors / Indicators: Aching, Throbbing Pain Intervention(s): Limited activity within patient's tolerance, Monitored during session, Premedicated before session, Repositioned, Other (comment) (polar care in place; pt declining pain meds at this time but states she will notify nursing if she wants pain meds) HR stable and WFL throughout treatment session.    Home Living                          Prior Function            PT Goals (current goals can now be found in the care plan section) Acute Rehab PT Goals Patient Stated Goal: to improve mobility and pain PT Goal Formulation: With patient/family Time For Goal Achievement: 04/09/22 Potential to Achieve Goals: Good Progress towards PT goals: Progressing toward goals    Frequency    BID      PT Plan Discharge plan needs to be updated (pt declining HHPT (plan for OP PT))    Co-evaluation              AM-PAC PT "6 Clicks" Mobility   Outcome Measure  Help needed turning from your back to your side while in a flat bed without using bedrails?: A Little Help needed moving from lying on your back to sitting on the side of a flat bed without using bedrails?: A Little Help needed moving to and from a bed to a chair (including a wheelchair)?: A Little Help needed standing up from a chair using your arms (e.g., wheelchair or  bedside chair)?: A Little Help needed to walk in hospital room?: A Little Help needed climbing 3-5 steps with a railing? : A Little 6 Click Score: 18    End of Session Equipment Utilized During Treatment: Gait belt;Other (comment) (R UE sling/immobilizer) Activity Tolerance: Patient tolerated treatment well Patient left: in chair;with call bell/phone within reach;with chair alarm set;with family/visitor present;Other (comment) (B heels floating via pillow support; polar care in place) Nurse Communication: Mobility status;Precautions;Weight bearing status PT Visit Diagnosis: Unsteadiness on feet (R26.81);Muscle weakness (generalized) (M62.81);Pain Pain - Right/Left: Right Pain - part of body: Shoulder     Time: 2947-6546 PT Time Calculation (min) (ACUTE ONLY): 39 min  Charges:  $Gait Training: 23-37 mins $Therapeutic Activity: 8-22 mins                     Willies Laviolette, PT 03/27/22, 11:00 AM

## 2022-03-27 NOTE — Hospital Course (Signed)
Stacey [name] is a 81 y.o. female with medical history significant of HTN, HLD, CAD, s/p of CABG, OSA in CPAP, CKD-3a,  A fib on Eliquis, who presents with right shoulder pain. Pt underwent successful right shoulder replacement procedure by Dr. Sharlet Salina of [facility] 03/26/2022.  Patient continues to have right shoulder pain, which is moderate to severe, sharp, nonradiating.  Postoperative significant pain, new oxygen requirement patient was kept overnight under hospitalist care for observation.  No acute findings on CXR.  In the morning 03/27/2022 she is on room air, SPO2 95%.  Consultants:  ***  Procedures: ***      ASSESSMENT & PLAN:   Principal Problem:   S/P shoulder replacement, right Active Problems:   Degenerative arthritis of right shoulder region   Essential hypertension   PAF (paroxysmal atrial fibrillation) (HCC)   CAD (coronary artery disease)   Hyperlipidemia LDL goal <70   Adult hypothyroidism   CVA (cerebral vascular accident) ([place])   Asthma   Chronic kidney disease, stage 3a ([place])   Depression with anxiety   OSA (obstructive sleep apnea)   Obesity with body mass index of 30.0-39.9   No problem-specific Assessment & Plan notes found for this encounter.     DVT prophylaxis: *** Pertinent IV fluids/nutrition: *** Central lines / invasive devices: ***  Code Status: *** Family Communication: ***  Disposition: *** TOC needs: *** Barriers to discharge / significant pending items: ***

## 2022-03-27 NOTE — Progress Notes (Signed)
Occupational Therapy Treatment Patient Details Name: Stacey [name] MRN: [id number] DOB: [birth year] Today's Date: 03/27/2022   History of present illness 81 yo female s/p reverse R TSA on 03/26/22. PMHx includes COPD, HTN, CAD, past MI, s/p CABG x3, anxiety, Afib, depression, DDD (cervical), CKD, fibromyalgia, GERD, glaucoma, CVA (09/2017), and HLD.   OT comments  Pt received semi-reclined in bed. Appearing alert and oriented; willing to work with OT on dressing and family training for polarice, sling, and UB dressing. T/f CGA sit to stand. Family and pt training completed. See flowsheet below for further details of session. Left semi-reclined in bed with all needs in reach. Three adults and two small children in room at time of OT exit. OT reported to PT. Pt's husband and daughter participated in family training. Ready for d/c when cleared by PT and medical team.  OT wore N95 mask during session 2/2 airborne precautions in medical chart (however, no airborne precautions noted on pt's door). Multiple family members in room; one one family member wearing a mask.     Recommendations for follow up therapy are one component of a multi-disciplinary discharge planning process, led by the attending physician.  Recommendations may be updated based on patient status, additional functional criteria and insurance authorization.    Follow Up Recommendations  Home health OT    Assistance Recommended at Discharge Intermittent Supervision/Assistance  Patient can return home with the following  A little help with walking and/or transfers;A lot of help with bathing/dressing/bathroom;Assistance with cooking/housework;Assist for transportation;Help with stairs or ramp for entrance;Direct supervision/assist for medications management   Equipment Recommendations  BSC/3in1    Recommendations for Other Services      Precautions / Restrictions Precautions Precautions: Shoulder;Fall Type of Shoulder  Precautions: sling at all times besides ADLs, NWB RUE Shoulder Interventions: Shoulder sling/immobilizer;Don joy ultra sling;Shoulder abduction pillow;At all times;Off for dressing/bathing/exercises Restrictions Weight Bearing Restrictions: Yes RUE Weight Bearing: Non weight bearing Other Position/Activity Restrictions: wrist/hand ROM ok       Mobility Bed Mobility   Bed Mobility: Supine to Sit     Supine to sit: Supervision, HOB elevated     General bed mobility comments: only stood for LB dressing    Transfers Overall transfer level: Needs assistance Equipment used: None Transfers: Sit to/from Stand Sit to Stand: Min guard                 Balance                                           ADL either performed or assessed with clinical judgement   ADL Overall ADL's : Needs assistance/impaired Eating/Feeding: Set up Eating/Feeding Details (indicate cue type and reason): using LUE only Grooming: Set up Grooming Details (indicate cue type and reason): LUE only Upper Body Bathing: Maximal assistance   Lower Body Bathing: Minimal assistance   Upper Body Dressing : Maximal assistance Upper Body Dressing Details (indicate cue type and reason): dressed today with husband and daughter support Lower Body Dressing: Min guard Lower Body Dressing Details (indicate cue type and reason): dressed LB today with OT intermittent CGA for safety instanding; much increased time Toilet Transfer: Min Psychiatric nurse Details (indicate cue type and reason): anticipated Toileting- Water quality scientist and Hygiene: Min guard Toileting - Clothing Manipulation Details (indicate cue type and reason): anticipated   Tub/Shower Transfer Details (indicate cue type  and reason): no showering at this time   General ADL Comments: OT provided training to husband and daughter (who has hx of shoulder replacement herself) during UB dressing; doffing/donning sling;  doffing/donning polarice; keeping fabric between skin and polarice. Good follow-through of OT teaching.    Extremity/Trunk Assessment     Lower Extremity Assessment Lower Extremity Assessment: Defer to PT evaluation        Vision       Perception     Praxis      Cognition Arousal/Alertness: Awake/alert Behavior During Therapy: WFL for tasks assessed/performed Overall Cognitive Status: Within Functional Limits for tasks assessed                                          Exercises      Shoulder Instructions       General Comments      Pertinent Vitals/ Pain       Pain Assessment Pain Assessment: 0-10 Pain Score: 6  Pain Location: R shoulder Pain Descriptors / Indicators: Aching, Throbbing Pain Intervention(s): Premedicated before session, Ice applied  Home Living                                          Prior Functioning/Environment              Frequency  Min 2X/week        Progress Toward Goals  OT Goals(current goals can now be found in the care plan section)  Progress towards OT goals: Progressing toward goals  Acute Rehab OT Goals Patient Stated Goal: go home; heal from surgery OT Goal Formulation: With patient/family Time For Goal Achievement: 04/09/22 Potential to Achieve Goals: Good ADL Goals Pt Will Perform Upper Body Dressing: with mod assist;sitting;with caregiver independent in assisting;with min assist Pt Will Perform Lower Body Dressing: with min assist;with caregiver independent in assisting;sit to/from stand Additional ADL Goal #1: Pt will indep instruct family in polar care mgt Additional ADL Goal #2: Pt will indep instruct family in shoulder sling/immobilizer mgt  Plan Discharge plan remains appropriate    Co-evaluation                 AM-PAC OT "6 Clicks" Daily Activity     Outcome Measure   Help from another person eating meals?: None Help from another person taking care of  personal grooming?: A Little Help from another person toileting, which includes using toliet, bedpan, or urinal?: A Little Help from another person bathing (including washing, rinsing, drying)?: A Lot Help from another person to put on and taking off regular upper body clothing?: A Lot Help from another person to put on and taking off regular lower body clothing?: None 6 Click Score: 18    End of Session    OT Visit Diagnosis: Other abnormalities of gait and mobility (R26.89);Pain Pain - Right/Left: Right Pain - part of body: Shoulder   Activity Tolerance     Patient Left  Semi-reclined in bed   Nurse Communication  ADL status; mobility status.        Time: 6160-7371 OT Time Calculation (min): 35 min  Charges: OT General Charges $OT Visit: 1 Visit OT Treatments $Self Care/Home Management : 23-37 mins  Waymon Amato, MS, OTR/L   Vania Rea 03/27/2022,  9:59 AM

## 2022-03-27 NOTE — Discharge Summary (Signed)
Physician Discharge Summary   Patient: Stacey [name] MRN: [id number]  DOB: 1941/01/23   Admit:     Date of Admission: 03/26/2022 Admitted from: home   Discharge: Date of discharge: 03/27/22 Disposition: Home health Condition at discharge: good  CODE STATUS: FULL CODE     Discharge Physician: Emeterio Reeve, DO [facility]     PCP: Eulis Foster, MD  Recommendations for Outpatient Follow-up:  Follow up with PCP Simmons-Robinson, Makiera, MD in 2-4 weeks Please obtain labs/tests: CBC BMP in 2-4 weeks Follow re: blood pressure and restart medications as able / as needed  FOllow w/ orthopedics as directed  Please follow up on the following pending results: surgical pathology  PCP AND OTHER OUTPATIENT PROVIDERS: SEE [place] TO GENERIC AVS PATIENT INFO    Discharge Instructions     Call MD / Call 911   Complete by: As directed    If you experience chest pain or shortness of breath, CALL 911 and be transported to the hospital emergency room.  If you develope a fever above 101 F, pus (white drainage) or increased drainage or redness at the wound, or calf pain, call your surgeon's office.   Constipation Prevention   Complete by: As directed    Drink plenty of fluids.  Prune juice may be helpful.  You may use a stool softener, such as Colace (over the counter) 100 mg twice a day.  Use MiraLax (over the counter) for constipation as needed.   Diet - low sodium heart healthy   Complete by: As directed    Diet - low sodium heart healthy   Complete by: As directed    Discharge wound care:   Complete by: As directed    Keep surgical site clean and dry, change dressings as needed / as directed by orthopedics   Increase activity slowly   Complete by: As directed    Increase activity slowly as tolerated   Complete by: As directed    Post-operative opioid taper instructions:   Complete by:  As directed    POST-OPERATIVE OPIOID TAPER INSTRUCTIONS: It is important to wean off of your opioid medication as soon as possible. If you do not need pain medication after your surgery it is ok to stop day one. Opioids include: Codeine, Hydrocodone(Norco, Vicodin), Oxycodone(Percocet, oxycontin) and hydromorphone amongst others.  Long term and even short term use of opiods can cause: Increased pain response Dependence Constipation Depression Respiratory depression And more.  Withdrawal symptoms can include Flu like symptoms Nausea, vomiting And more Techniques to manage these symptoms Hydrate well Eat regular healthy meals Stay active Use relaxation techniques(deep breathing, meditating, yoga) Do Not substitute Alcohol to help with tapering If you have been on opioids for less than two weeks and do not have pain than it is ok to stop all together.  Plan to wean off of opioids This plan should start within one week post op of your joint replacement. Maintain the same interval or time between taking each dose and first decrease the dose.  Cut the total daily intake of opioids by one tablet each day Next start to increase the time between doses. The last dose that should be eliminated is the evening dose.            Discharge Diagnoses: Principal Problem:   S/P shoulder replacement, right Active Problems:   Degenerative arthritis of right shoulder region   Essential hypertension  PAF (paroxysmal atrial fibrillation) (HCC)   CAD (coronary artery disease)   Hyperlipidemia LDL goal <70   Adult hypothyroidism   CVA (cerebral vascular accident) ([place])   Asthma   Chronic kidney disease, stage 3a ([place])   Depression with anxiety   OSA (obstructive sleep apnea)   Obesity with body mass index of 30.0-39.9       Hospital Course: Stacey [name] is a 81 y.o. female with medical history significant of HTN, HLD, CAD, s/p of CABG, OSA in CPAP, CKD-3a,  A fib on Eliquis,  who presents with right shoulder pain. Pt underwent successful right shoulder replacement procedure by Dr. Sharlet Salina of [facility] 03/26/2022.  Patient continues to have right shoulder pain, which is moderate to severe, sharp, nonradiating. Also c/o SOB  Postoperative significant pain, new oxygen requirement patient was kept overnight under hospitalist care for observation.  No acute findings on CXR.  In the morning 03/27/2022 she is on room air, SPO2 95%.  Consultants:  Orthopedics   Procedures: S/p right shoulder replacement procedure by Dr. Sharlet Salina of Ortho 03/26/2022      ASSESSMENT & PLAN:   Principal Problem:   S/P shoulder replacement, right Active Problems:   Degenerative arthritis of right shoulder region   Essential hypertension   PAF (paroxysmal atrial fibrillation) (HCC)   CAD (coronary artery disease)   Hyperlipidemia LDL goal <70   Adult hypothyroidism   CVA (cerebral vascular accident) ([place])   Asthma   Chronic kidney disease, stage 3a ([place])   Depression with anxiety   OSA (obstructive sleep apnea)   Obesity with body mass index of 30.0-39.9     S/P shoulder replacement, right due to degenerative arthritis of right shoulder region: -place in tele bed for obs --> no overnight events,  -PT/OT --> pt declined home health  -Pain:: As needed Norco, fentanyl, Tylenol --> po meds on discharge see med rec  -As needed Robaxin   Essential hypertension: Blood pressure soft, 79/60, 106/64 -Hold home blood pressure medications, including HCTZ, amlodipine, Cozaar and Imdur  -Continue metoprolol and imdur on discharge   PAF (paroxysmal atrial fibrillation) ([place]): -restart Eliquis tomorrow per Dr. Sharlet Salina -Continue metoprolol   CAD (coronary artery disease) -Continue aspirin, Crestor   Hyperlipidemia LDL goal <70 -Crestor   Adult hypothyroidism -Synthroid   CVA (cerebral vascular accident) ([place]) -Aspirin, Crestor   Asthma: No wheezing -Bronchodilators   Chronic  kidney disease, stage 3a ([place]): Recent baseline creatinine 0.97 -follow outpatient BMP   Depression with anxiety -Continue home medications   OSA (obstructive sleep apnea) -CPAP   Obesity with body mass index of 30.0-39.9: BMI 34.75, body weight 86.2 kg -Healthy diet -Encourage with losing   2 L new oxygen requirement: resolved -As needed albuterol and Mucinex            Discharge Instructions  Allergies as of 03/27/2022       Reactions   Cephalexin Anaphylaxis   Atorvastatin    myalgia   Morphine And Related Hives, Nausea And Vomiting   Other Hives, Other (See Comments)   Muscle pain   Procaine Other (See Comments)   Chest pain Chest pain   Shellfish Allergy Swelling   angioedema   Statins Other (See Comments)   Muscle pain   Tomato Other (See Comments)   (by testing) - also beans, wheat, peas (by testing) - also beans, wheat, peas   Penicillins Hives, Rash   Has patient had a PCN reaction causing immediate rash, facial/tongue/throat swelling, SOB or  lightheadedness with hypotension: Yes Has patient had a PCN reaction causing severe rash involving mucus membranes or skin necrosis: No Has patient had a PCN reaction that required hospitalization: No Has patient had a PCN reaction occurring within the last 10 years: Yes If all of the above answers are "NO", then may proceed with Cephalosporin use.        Medication List     STOP taking these medications    amLODipine 5 MG tablet Commonly known as: NORVASC   chlorthalidone 25 MG tablet Commonly known as: HYGROTON   losartan 25 MG tablet Commonly known as: COZAAR       TAKE these medications    acyclovir 400 MG tablet Commonly known as: ZOVIRAX Take 400 mg by mouth 3 times per day for 5 days to start immediately with each episode of fever blister. May also take one a day for prevention.   albuterol 108 (90 Base) MCG/ACT inhaler Commonly known as: Ventolin HFA INHALE 2 PUFFS BY MOUTH EVERY 4  TO 6 HOURS AS NEEDED FOR SHORTNESS OF BREATH   apixaban 5 MG Tabs tablet Commonly known as: ELIQUIS Take 1 tablet (5 mg total) by mouth 2 (two) times daily.   ascorbic acid 500 MG tablet Commonly known as: VITAMIN C Take 2,000 mg by mouth daily.   aspirin 81 MG tablet Take 1 tablet (81 mg total) by mouth daily.   B-12 500 MCG Tabs Take 500 mcg by mouth daily.   buPROPion 300 MG 24 hr tablet Commonly known as: WELLBUTRIN XL Take 1 tablet (300 mg total) by mouth daily.   CRANBERRY PO Take 1 tablet by mouth daily.   dextromethorphan-guaiFENesin 30-600 MG 12hr tablet Commonly known as: MUCINEX DM Take 1 tablet by mouth 2 (two) times daily as needed for cough.   diazepam 5 MG tablet Commonly known as: VALIUM TAKE ONE TABLET EVERY 12 HOURS AS NEEDEDFOR ANXIETY   ELDERBERRY PO Take by mouth daily. Unsure dose / combination vitamin   EPINEPHrine 0.3 mg/0.3 mL Soaj injection Commonly known as: EpiPen 2-Pak INJECT AS DIRECTED FOR SEVERE ALLERGIC REACTION What changed:  how much to take how to take this when to take this reasons to take this additional instructions   fluocinonide gel 0.05 % Commonly known as: LIDEX Apply 1 application topically as needed.   HYDROcodone-acetaminophen 5-325 MG tablet Commonly known as: NORCO/VICODIN Take 1-2 tablets by mouth every 6 (six) hours as needed for up to 5 days for severe pain.   isosorbide mononitrate 30 MG 24 hr tablet Commonly known as: IMDUR Take 30 mg by mouth daily.   levothyroxine 100 MCG tablet Commonly known as: SYNTHROID TAKE 1 TABLET BY MOUTH DAILY   menthol-cetylpyridinium 3 MG lozenge Commonly known as: CEPACOL Take 1 lozenge (3 mg total) by mouth as needed for sore throat.   methocarbamol 500 MG tablet Commonly known as: ROBAXIN Take 1 tablet (500 mg total) by mouth every 8 (eight) hours as needed for muscle spasms.   metoprolol tartrate 25 MG tablet Commonly known as: LOPRESSOR One and one half tabs  twice daily What changed:  how much to take how to take this when to take this additional instructions   mometasone 0.1 % cream Commonly known as: ELOCON Apply 1 application topically daily as needed (Rash).   Multi-Vitamins Tabs Take 1 tablet by mouth daily.   oxymetazoline 0.05 % nasal spray Commonly known as: AFRIN Place 1 spray into both nostrils as needed for congestion.   rosuvastatin  20 MG tablet Commonly known as: CRESTOR TAKE ONE TABLET (20 MG) BY MOUTH EVERY EVENING   sertraline 25 MG tablet Commonly known as: ZOLOFT TAKE 1 TABLET BY MOUTH DAILY. SCHEDULE APPT FOR MORE REFILLS   tacrolimus 0.1 % ointment Commonly known as: PROTOPIC As needed for rosacea flare ups   tacrolimus 0.1 % ointment Commonly known as: Protopic Apply qd to bid qd prn to lips for dryness   VITAMIN B-6 PO Take 1 tablet by mouth daily.   Vitamin D 50 MCG (2000 UT) tablet Take 2,000 Units by mouth daily.   zinc gluconate 50 MG tablet Take 50 mg by mouth daily.   zolpidem 5 MG tablet Commonly known as: AMBIEN TAKE ONE TABLET BY MOUTH AT BEDTIME AS NEEDED FOR SLEEP               Discharge Care Instructions  (From admission, onward)           Start     Ordered   03/27/22 0000  Discharge wound care:       Comments: Keep surgical site clean and dry, change dressings as needed / as directed by orthopedics   03/27/22 1051             Follow-up Information     Renee Harder, MD Follow up.   Specialty: Orthopedic Surgery Contact information: [place] [address] [phone]                 Allergies  Allergen Reactions   Cephalexin Anaphylaxis   Atorvastatin     myalgia   Morphine And Related Hives and Nausea And Vomiting   Other Hives and Other (See Comments)    Muscle pain   Procaine Other (See Comments)    Chest pain Chest pain   Shellfish Allergy Swelling    angioedema   Statins Other (See Comments)    Muscle  pain   Tomato Other (See Comments)    (by testing) - also beans, wheat, peas (by testing) - also beans, wheat, peas   Penicillins Hives and Rash    Has patient had a PCN reaction causing immediate rash, facial/tongue/throat swelling, SOB or lightheadedness with hypotension: Yes Has patient had a PCN reaction causing severe rash involving mucus membranes or skin necrosis: No Has patient had a PCN reaction that required hospitalization: No Has patient had a PCN reaction occurring within the last 10 years: Yes If all of the above answers are "NO", then may proceed with Cephalosporin use.     Subjective: pt reports pain is controlled, no SOB, she has intermittent SOB at baseline whic hshe attributes to Afib. NO chest pain or palpitations.    Discharge Exam: BP 124/73 (BP Location: Left Arm)   Pulse 82   Temp (!) 97.4 F (36.3 C)   Resp 17   Ht '5\' 2"'$  (1.575 m)   Wt 86.2 kg   SpO2 95%   BMI 34.75 kg/m  General: Pt is alert, awake, not in acute distress Cardiovascular: reg rate, irreg/irreg rhythm, S1/S2 +, no rubs, no gallops Respiratory: CTA bilaterally, no wheezing, no rhonchi Abdominal: Soft, NT, ND, bowel sounds + Extremities: no edema, no cyanosis     The results of significant diagnostics from this hospitalization (including imaging, microbiology, ancillary and laboratory) are listed below for reference.     Microbiology: No results found for this or any previous visit (from the past 240 hour(s)).   Labs: BNP (last 3 results) Recent Labs  03/26/22 2012  BNP 470.9*   Basic Metabolic Panel: Recent Labs  Lab 03/27/22 0443  NA 132*  K 4.0  CL 96*  CO2 27  GLUCOSE 170*  BUN 16  CREATININE 0.92  CALCIUM 8.9   Liver Function Tests: No results for input(s): "AST", "ALT", "ALKPHOS", "BILITOT", "PROT", "ALBUMIN" in the last 168 hours. No results for input(s): "LIPASE", "AMYLASE" in the last 168 hours. No results for input(s): "AMMONIA" in the last 168  hours. CBC: Recent Labs  Lab 03/27/22 0443  WBC 12.2*  HGB 12.3  HCT 36.7  MCV 87.8  PLT 164   Cardiac Enzymes: No results for input(s): "CKTOTAL", "CKMB", "CKMBINDEX", "TROPONINI" in the last 168 hours. BNP: Invalid input(s): "POCBNP" CBG: No results for input(s): "GLUCAP" in the last 168 hours. D-Dimer No results for input(s): "DDIMER" in the last 72 hours. Hgb A1c No results for input(s): "HGBA1C" in the last 72 hours. Lipid Profile No results for input(s): "CHOL", "HDL", "LDLCALC", "TRIG", "CHOLHDL", "LDLDIRECT" in the last 72 hours. Thyroid function studies No results for input(s): "TSH", "T4TOTAL", "T3FREE", "THYROIDAB" in the last 72 hours.  Invalid input(s): "FREET3" Anemia work up No results for input(s): "VITAMINB12", "FOLATE", "FERRITIN", "TIBC", "IRON", "RETICCTPCT" in the last 72 hours. Urinalysis    Component Value Date/Time   COLORURINE YELLOW (A) 03/13/2022 1100   APPEARANCEUR CLEAR (A) 03/13/2022 1100   APPEARANCEUR Hazy (A) 08/11/2021 1357   LABSPEC 1.014 03/13/2022 1100   PHURINE 7.0 03/13/2022 1100   GLUCOSEU NEGATIVE 03/13/2022 1100   HGBUR NEGATIVE 03/13/2022 1100   BILIRUBINUR NEGATIVE 03/13/2022 1100   BILIRUBINUR negative 10/17/2021 1805   BILIRUBINUR Negative 08/11/2021 1357   KETONESUR NEGATIVE 03/13/2022 1100   PROTEINUR NEGATIVE 03/13/2022 1100   UROBILINOGEN 0.2 10/17/2021 1805   NITRITE NEGATIVE 03/13/2022 1100   LEUKOCYTESUR MODERATE (A) 03/13/2022 1100   Sepsis Labs Recent Labs  Lab 03/27/22 0443  WBC 12.2*   Microbiology No results found for this or any previous visit (from the past 240 hour(s)). Imaging DG Chest Port 1 View  Result Date: 03/26/2022 CLINICAL DATA:  Shortness of breath EXAM: PORTABLE CHEST 1 VIEW COMPARISON:  10/25/2018 FINDINGS: Transverse diameter of heart is increased. Apparent shift of mediastinum to the right may be due to rotation. There is previous coronary artery bypass surgery. Thoracic aorta is  tortuous. Central pulmonary vessels are prominent. There are no signs of alveolar pulmonary edema or focal pulmonary consolidation. Small linear densities are seen in the left parahilar region, possibly minimal scarring. There is no pleural effusion or pneumothorax. There is reverse arthroplasty in right shoulder. IMPRESSION: Cardiomegaly. Central pulmonary vessels are prominent without signs of alveolar pulmonary edema. There is no focal pulmonary consolidation. Linear densities in left parahilar region may suggest scarring or subsegmental atelectasis. Electronically Signed   By: Elmer Picker M.D.   On: 03/26/2022 19:55   DG Shoulder Right Port  Result Date: 03/26/2022 CLINICAL DATA:  Total shoulder arthroplasty EXAM: RIGHT SHOULDER - 1 VIEW COMPARISON:  None Available. FINDINGS: Interval right shoulder reverse arthroplasty. Postsurgical changes in the surrounding soft tissues. Normal alignment. No acute fracture or dislocation. IMPRESSION: 1. Interval right shoulder reverse arthroplasty. Electronically Signed   By: Kathreen Devoid M.D.   On: 03/26/2022 10:58      Time coordinating discharge: ove 30 minutes  SIGNED:  Emeterio Reeve DO [facility]

## 2022-03-27 NOTE — Progress Notes (Signed)
Patient discharged to home; wheeled out of unit by NT with all belongings and accompanied by husband. A+Ox4. VSS. Patient satisfied with pain management. Medications and discharge instructions reviewed. All questions answered. PIV x1 removed, no bleeding and intact. Patient verbalized understanding of signs and symptoms of infections. Patient agreed to follow up with Orthopedic and with all appointments as listed on AVS.

## 2022-03-27 NOTE — Plan of Care (Signed)
  Problem: Education: Goal: Knowledge of the prescribed therapeutic regimen will improve 03/27/2022 1152 by Orvan Seen, RN Outcome: Completed/Met 03/27/2022 1152 by Orvan Seen, RN Outcome: Progressing Goal: Understanding of activity limitations/precautions following surgery will improve 03/27/2022 1152 by Orvan Seen, RN Outcome: Completed/Met 03/27/2022 1152 by Orvan Seen, RN Outcome: Progressing Goal: Individualized Educational Video(s) 03/27/2022 1152 by Orvan Seen, RN Outcome: Completed/Met 03/27/2022 1152 by Orvan Seen, RN Outcome: Progressing   Problem: Activity: Goal: Ability to tolerate increased activity will improve 03/27/2022 1152 by Orvan Seen, RN Outcome: Completed/Met 03/27/2022 1152 by Orvan Seen, RN Outcome: Progressing   Problem: Pain Management: Goal: Pain level will decrease with appropriate interventions 03/27/2022 1152 by Orvan Seen, RN Outcome: Completed/Met 03/27/2022 1152 by Orvan Seen, RN Outcome: Progressing   Problem: Education: Goal: Knowledge of General Education information will improve Description: Including pain rating scale, medication(s)/side effects and non-pharmacologic comfort measures 03/27/2022 1152 by Orvan Seen, RN Outcome: Completed/Met 03/27/2022 1152 by Orvan Seen, RN Outcome: Progressing   Problem: Health Behavior/Discharge Planning: Goal: Ability to manage health-related needs will improve 03/27/2022 1152 by Orvan Seen, RN Outcome: Completed/Met 03/27/2022 1152 by Orvan Seen, RN Outcome: Progressing   Problem: Clinical Measurements: Goal: Ability to maintain clinical measurements within normal limits will improve 03/27/2022 1152 by Orvan Seen, RN Outcome: Completed/Met 03/27/2022 1152 by Orvan Seen, RN Outcome: Progressing Goal: Will remain free from infection 03/27/2022 1152 by Orvan Seen, RN Outcome: Completed/Met 03/27/2022 1152 by  Orvan Seen, RN Outcome: Progressing Goal: Diagnostic test results will improve 03/27/2022 1152 by Orvan Seen, RN Outcome: Completed/Met 03/27/2022 1152 by Orvan Seen, RN Outcome: Progressing Goal: Respiratory complications will improve 03/27/2022 1152 by Orvan Seen, RN Outcome: Completed/Met 03/27/2022 1152 by Orvan Seen, RN Outcome: Progressing Goal: Cardiovascular complication will be avoided 03/27/2022 1152 by Orvan Seen, RN Outcome: Completed/Met 03/27/2022 1152 by Orvan Seen, RN Outcome: Progressing   Problem: Activity: Goal: Risk for activity intolerance will decrease 03/27/2022 1152 by Orvan Seen, RN Outcome: Completed/Met 03/27/2022 1152 by Orvan Seen, RN Outcome: Progressing   Problem: Nutrition: Goal: Adequate nutrition will be maintained 03/27/2022 1152 by Orvan Seen, RN Outcome: Completed/Met 03/27/2022 1152 by Orvan Seen, RN Outcome: Progressing   Problem: Coping: Goal: Level of anxiety will decrease 03/27/2022 1152 by Orvan Seen, RN Outcome: Completed/Met 03/27/2022 1152 by Orvan Seen, RN Outcome: Progressing   Problem: Elimination: Goal: Will not experience complications related to bowel motility 03/27/2022 1152 by Orvan Seen, RN Outcome: Completed/Met 03/27/2022 1152 by Orvan Seen, RN Outcome: Progressing Goal: Will not experience complications related to urinary retention 03/27/2022 1152 by Orvan Seen, RN Outcome: Completed/Met 03/27/2022 1152 by Orvan Seen, RN Outcome: Progressing   Problem: Pain Managment: Goal: General experience of comfort will improve 03/27/2022 1152 by Orvan Seen, RN Outcome: Completed/Met 03/27/2022 1152 by Orvan Seen, RN Outcome: Progressing   Problem: Safety: Goal: Ability to remain free from injury will improve 03/27/2022 1152 by Orvan Seen, RN Outcome: Completed/Met 03/27/2022 1152 by Orvan Seen,  RN Outcome: Progressing   Problem: Skin Integrity: Goal: Risk for impaired skin integrity will decrease 03/27/2022 1152 by Orvan Seen, RN Outcome: Completed/Met 03/27/2022 1152 by Orvan Seen, RN Outcome: Progressing

## 2022-03-27 NOTE — Progress Notes (Signed)
     Subjective: 1 Day Post-Op Procedure(s) (LRB): TOTAL SHOULDER ARTHROPLASTY- reverse (Right)   Patient reports pain as moderate, controlled with medications. Denies and complications throughout the night. Ready to be discharged home.    Objective:   VITALS:   Vitals:   03/27/22 0511 03/27/22 0749  BP: 125/81 124/73  Pulse: 77 82  Resp: 17 17  Temp:  (!) 97.4 F (36.3 C)  SpO2: 93% 95%    ABD soft Neurovascular intact Incision: no drainage No cellulitis present Compartment soft  LABS Recent Labs    03/27/22 0443  HGB 12.3  HCT 36.7  WBC 12.2*  PLT 164    Recent Labs    03/27/22 0443  NA 132*  K 4.0  BUN 16  CREATININE 0.92  GLUCOSE 170*     Assessment/Plan: 1 Day Post-Op Procedure(s) (LRB): TOTAL SHOULDER ARTHROPLASTY- reverse (Right)   -Advance diet -Up with therapy -Discharge home  -Appreciate hospitalist medical management -PT/OT: Nonweightbearing in sling, okay for hand wrist and elbow motion -Multimodal pain control -Okay to restart Eliquis on postop day [place]

## 2022-03-27 NOTE — Plan of Care (Signed)
  Problem: Education: Goal: Knowledge of the prescribed therapeutic regimen will improve Outcome: Progressing   Problem: Education: Goal: Understanding of activity limitations/precautions following surgery will improve Outcome: Progressing   Problem: Activity: Goal: Ability to tolerate increased activity will improve Outcome: Progressing   Problem: Pain Management: Goal: Pain level will decrease with appropriate interventions Outcome: Progressing   Problem: Coping: Goal: Level of anxiety will decrease Outcome: Progressing   Problem: Nutrition: Goal: Adequate nutrition will be maintained Outcome: Progressing   Problem: Pain Managment: Goal: General experience of comfort will improve Outcome: Progressing   Problem: Safety: Goal: Ability to remain free from injury will improve Outcome: Progressing   Problem: Skin Integrity: Goal: Risk for impaired skin integrity will decrease Outcome: Progressing

## 2022-03-27 NOTE — Progress Notes (Signed)
Spoke with the patient's husband since the patient was in the bathroom He stated thaty she does not need nor want DME She does not need nor want HH< she will be going to [facility] at [facility] The husband is providing transportation

## 2022-03-30 ENCOUNTER — Other Ambulatory Visit: Payer: Self-pay | Admitting: Family Medicine

## 2022-03-30 DIAGNOSIS — F5101 Primary insomnia: Secondary | ICD-10-CM

## 2022-03-30 DIAGNOSIS — M25511 Pain in right shoulder: Secondary | ICD-10-CM | POA: Diagnosis not present

## 2022-03-30 DIAGNOSIS — M25611 Stiffness of right shoulder, not elsewhere classified: Secondary | ICD-10-CM | POA: Diagnosis not present

## 2022-03-30 NOTE — Telephone Encounter (Signed)
[facility] faxed refill request for the following medications:    zolpidem (AMBIEN) 5 MG tablet   Please advise

## 2022-03-31 ENCOUNTER — Ambulatory Visit: Payer: Self-pay | Admitting: *Deleted

## 2022-03-31 NOTE — Patient Outreach (Signed)
  Care Coordination   Follow Up Visit Note   03/31/2022 Name: Stacey [name] MRN: [id number] DOB: [birth year]  Stacey [name] is a 81 y.o. year old female who sees Simmons-Robinson, Riki Sheer, MD for primary care. I spoke with  husband of Stacey [name] by phone today.  What matters to the patients health and wellness today?  Report surgery went well, no complications, now active with PT.  Denies any urgent concerns, encouraged to contact this care manager with questions.      Goals Addressed             This Visit's Progress    COMPLETED: Shoulder surgery without complications       Care Coordination Interventions: Evaluation of current treatment plan related to Shoulder surgery and patient's adherence to plan as established by provider Advised patient to contact this RNCM if she has any questions/concerns about cost of medications Provided education to patient re: plan of care post op, including outpatient PT Reviewed scheduled/upcoming provider appointments including PCP on 12/14 and follow up with ortho surgeon Discussed plans with patient for ongoing care management follow up and provided patient with direct contact information for care management team Confirmed member is now active with PT, currently in session        SDOH assessments and interventions completed:  No     Care Coordination Interventions Activated:  Yes  Care Coordination Interventions:  Yes, provided   Follow up plan: No further intervention required.   Encounter Outcome:  Pt. Visit Completed   Valente David, RN, MSN, [place] Care Management Care Management Coordinator [phone]

## 2022-03-31 NOTE — Telephone Encounter (Signed)
Requested medications are due for refill today.  yes  Requested medications are on the active medications list.  yes  Last refill. 11/19/2021 #30 3 rf  Future visit scheduled.   yes  Notes to clinic.  Refill not delegated.    Requested Prescriptions  Pending Prescriptions Disp Refills   zolpidem (AMBIEN) 5 MG tablet 30 tablet 3    Sig: Take 1 tablet (5 mg total) by mouth at bedtime as needed. for sleep     Not Delegated - Psychiatry:  Anxiolytics/Hypnotics Failed - 03/30/2022  3:37 PM      Failed - This refill cannot be delegated      Failed - Urine Drug Screen completed in last 360 days      Passed - Valid encounter within last 6 months    Recent Outpatient Visits           4 months ago Cellulitis of right lower extremity   [facility] [name], MD   9 months ago Acute cystitis with hematuria   [place], Dani Gobble, PA-C   11 months ago Essential hypertension   [facility] Mikey Kirschner, PA-C   1 year ago Cellulitis of right lower extremity   [facility] Fenton Malling M, [place]   1 year ago Cellulitis of right lower extremity   [facility] [name], MD       Future Appointments             In 1 month [name] Monia Sabal, MD [place]   In 1 month Simmons-Robinson, Riki Sheer, MD [facility], [place]

## 2022-04-06 DIAGNOSIS — Z4889 Encounter for other specified surgical aftercare: Secondary | ICD-10-CM | POA: Diagnosis not present

## 2022-04-08 DIAGNOSIS — M25511 Pain in right shoulder: Secondary | ICD-10-CM | POA: Diagnosis not present

## 2022-04-08 DIAGNOSIS — M25611 Stiffness of right shoulder, not elsewhere classified: Secondary | ICD-10-CM | POA: Diagnosis not present

## 2022-04-13 ENCOUNTER — Other Ambulatory Visit: Payer: Self-pay

## 2022-04-13 ENCOUNTER — Telehealth: Payer: Self-pay | Admitting: Family Medicine

## 2022-04-13 DIAGNOSIS — F324 Major depressive disorder, single episode, in partial remission: Secondary | ICD-10-CM

## 2022-04-13 MED ORDER — SERTRALINE HCL 25 MG PO TABS: ORAL_TABLET | ORAL | 1 refills | Status: AC

## 2022-04-13 NOTE — Telephone Encounter (Signed)
[facility] faxed refill request for the following medications:   sertraline (ZOLOFT) 25 MG tablet     Please advise.

## 2022-04-14 DIAGNOSIS — M25611 Stiffness of right shoulder, not elsewhere classified: Secondary | ICD-10-CM | POA: Diagnosis not present

## 2022-04-14 DIAGNOSIS — M25511 Pain in right shoulder: Secondary | ICD-10-CM | POA: Diagnosis not present

## 2022-04-23 DIAGNOSIS — M25611 Stiffness of right shoulder, not elsewhere classified: Secondary | ICD-10-CM | POA: Diagnosis not present

## 2022-04-23 DIAGNOSIS — M25511 Pain in right shoulder: Secondary | ICD-10-CM | POA: Diagnosis not present

## 2022-04-30 ENCOUNTER — Telehealth: Payer: Self-pay | Admitting: Family Medicine

## 2022-04-30 DIAGNOSIS — M25611 Stiffness of right shoulder, not elsewhere classified: Secondary | ICD-10-CM | POA: Diagnosis not present

## 2022-04-30 DIAGNOSIS — M25511 Pain in right shoulder: Secondary | ICD-10-CM | POA: Diagnosis not present

## 2022-04-30 DIAGNOSIS — F5101 Primary insomnia: Secondary | ICD-10-CM

## 2022-04-30 NOTE — Telephone Encounter (Signed)
[facility] faxed refill request for the following medications:   diazepam (VALIUM) 5 MG tablet     Please advise

## 2022-04-30 NOTE — Telephone Encounter (Signed)
[facility] faxed refill request for the following medications:   zolpidem (AMBIEN) 5 MG tablet     Please advise

## 2022-05-01 ENCOUNTER — Other Ambulatory Visit: Payer: Self-pay | Admitting: Family Medicine

## 2022-05-01 DIAGNOSIS — F5101 Primary insomnia: Secondary | ICD-10-CM

## 2022-05-01 MED ORDER — ZOLPIDEM TARTRATE 5 MG PO TABS: 5.0000 mg | ORAL_TABLET | Freq: Every evening | ORAL | 0 refills | Status: AC | PRN

## 2022-05-01 MED ORDER — DIAZEPAM 5 MG PO TABS: 5.0000 mg | ORAL_TABLET | Freq: Two times a day (BID) | ORAL | 0 refills | Status: AC | PRN

## 2022-05-03 ENCOUNTER — Ambulatory Visit
Admission: EM | Admit: 2022-05-03 | Discharge: 2022-05-03 | Disposition: A | Discharge status: Home / Self Care | Payer: PPO | Attending: Urgent Care | Admitting: Urgent Care

## 2022-05-03 ENCOUNTER — Other Ambulatory Visit
Admission: RE | Admit: 2022-05-03 | Discharge: 2022-05-03 | Disposition: A | Discharge status: Home / Self Care | Payer: PPO | Source: Ambulatory Visit | Attending: Urgent Care | Admitting: Urgent Care

## 2022-05-03 DIAGNOSIS — N3001 Acute cystitis with hematuria: Secondary | ICD-10-CM

## 2022-05-03 DIAGNOSIS — R300 Dysuria: Secondary | ICD-10-CM | POA: Diagnosis not present

## 2022-05-03 LAB — POCT URINALYSIS DIP (MANUAL ENTRY)
Bilirubin, UA: NEGATIVE
Glucose, UA: NEGATIVE mg/dL
Ketones, POC UA: NEGATIVE mg/dL
Nitrite, UA: NEGATIVE
Spec Grav, UA: 1.02 (ref 1.010–1.025)
Urobilinogen, UA: 0.2 E.U./dL
pH, UA: 7 (ref 5.0–8.0)

## 2022-05-03 MED ORDER — SULFAMETHOXAZOLE-TRIMETHOPRIM 800-160 MG PO TABS: 1.0000 | ORAL_TABLET | Freq: Two times a day (BID) | ORAL | 0 refills | Status: AC

## 2022-05-03 NOTE — ED Triage Notes (Signed)
Pt. Presents to UC w/ c/o urinary frequency, dysuria, and hematuria for the past 3 days.

## 2022-05-03 NOTE — ED Provider Notes (Signed)
[facility]    CSN: [id number] Arrival date & time: 05/03/22  5726      History   Chief Complaint No chief complaint on file.   HPI Stacey [name] is a 81 y.o. female.   HPI  Per her chart, recurrent UTIs and last treated 6 months ago with bactrim x 7 days.  PMH is significant for stage IIIa CKD.  Most recent GFR (03/27/2022) estimated at greater than 60.  Past Medical History:  Diagnosis Date   Abnormal nuclear stress test 05/24/2017   Anxiety    a.) on BZO (diazepam) PRN   Aortic atherosclerosis (HCC)    Arthritis    Asthma    Atrial fibrillation, transient ([place])    a.) CHA2DS2VASc = 7 (age x 2, sex, HTN, CVA x 2, prior MI);  b.) rate/rhythm maintained on oral metoprolol tartrate; chronically anticoagulated with apixaban   Benign neoplasm of ascending colon    Benign neoplasm of descending colon    CAD (coronary artery disease)    a.) LHC at [facility] 02/20/2017: 80% oRI-RI, 90% o-pLAD, 95% mLAD, 99% p-mRCA --> urgent CVTS consult; b.) 3v CABG at [facility]; c.) LHC 05/24/2017: 100% o-pLAD, 80% oRI-RI, 100% p-mRCA, SVG-RCA patent, SVG-oD1 with min luminal irregs, LIMA-LAD patent -- med mgmt.   Chronic lower back pain    CKD (chronic kidney disease) stage 3, GFR 30-59 ml/min (HCC)    Complication of anesthesia    DDD (degenerative disc disease), cervical    Depression    Dyspnea    Family history of adverse reaction to anesthesia    most of family - PONV   Fibromyalgia    GERD (gastroesophageal reflux disease)    Glaucoma    History of 2019 novel coronavirus disease (COVID-19) 02/02/2019   History of bilateral cataract extraction    HTN (hypertension) 03/09/2007   Hyperlipidemia LDL goal <70 03/25/1993   Hypertension    Hypothyroidism    Insomnia    a.) on hypnotic (zolpidem) PRN   Long term current use of anticoagulant    a.) apixaban   Migraines    a.) 1-2x/mo   Motion sickness    all moving vehicles   NSTEMI (non-ST elevated myocardial infarction)  ([place]) 02/20/2017   a.) LHC at [facility] 02/20/2017: 80% oRI-RI, 90% o-pLAD, 95% mLAD, 99% p-mRCA --> urgent CVTS consult; b.) 3v CABG at [facility]   OSA on CPAP    PONV (postoperative nausea and vomiting)    S/P CABG x 3    a.) LIMA-LAD, SVG-dRCA, SVG-D1   Squamous cell carcinoma of skin 07/25/2019   Left upper lateral lip above vermillion border. 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   CAD (coronary artery disease) 03/26/2022   Asthma 03/26/2022   Chronic kidney disease, stage 3a ([place]) 03/26/2022   Depression with anxiety 03/26/2022   Obesity with body mass index of 30.0-39.9 03/26/2022   Arthropathy of lumbar facet joint 08/13/2021   Hip pain 08/13/2021   Chronic low back pain 08/13/2021   Acute cystitis with hematuria 06/05/2021   OSA (obstructive sleep apnea) 02/06/2020   Lymphedema 01/19/2020   Cellulitis of right lower extremity 11/30/2019   Personal history of other malignant neoplasm of skin  10/02/2019   Chest pain 10/26/2018   TIA (transient ischemic attack) 03/21/2018   CVA (cerebral vascular accident) ([place]) 10/22/2017   Osteoarthritis of right hip 07/30/2017   Sciatic pain, right 07/02/2017   Abnormal nuclear stress test 05/24/2017   Coronary artery disease involving coronary bypass graft of native heart 03/15/2017   PAF (paroxysmal atrial fibrillation) (HCC)    Non-STEMI (non-ST elevated myocardial infarction) ([place]) 02/20/2017   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   Moderate mitral insufficiency 01/11/2017   Pain in left knee 09/09/2016   Personal history of colonic polyps    Disequilibrium 09/16/2015   Panic attacks 09/11/2015   Ganglion cyst 08/16/2015   Hypernatremia 07/04/2015   Other osteoarthritis  involving multiple joints 02/06/2015   Clinical depression 09/27/2014   Dry mouth 09/27/2014   Fibrositis 09/27/2014   LBP (low back pain) 09/27/2014   Avitaminosis D 09/27/2014   Decreased leukocytes 09/27/2014   Prediabetes 05/03/2009   Insomnia 09/29/2007   Essential hypertension 03/09/2007   Adaptation reaction 01/29/2007   Acid reflux 10/07/2006   Menopausal and postmenopausal disorder 10/05/2006   Headache, migraine 10/05/2006   Arthritis, degenerative 03/23/1994   Adult hypothyroidism 04/15/1993   Hyperlipidemia LDL goal <70 03/25/1993    Past Surgical History:  Procedure Laterality Date   ABDOMINAL HYSTERECTOMY     APPENDECTOMY     BLADDER SURGERY     CATARACT EXTRACTION W/ INTRAOCULAR LENS  IMPLANT, BILATERAL     COLONOSCOPY WITH PROPOFOL N/A 01/17/2016   Procedure: COLONOSCOPY WITH PROPOFOL;  Surgeon: [name], MD;  Location: [place];  Service: Endoscopy;  Laterality: N/A;   CORONARY ARTERY BYPASS GRAFT N/A 02/20/2017   Procedure: CORONARY ARTERY BYPASS GRAFTING (CABG) x , three using left internal mammary artery to left anterior descending coronary artery and right greater saphenous vein harvested endoscopically to distal right and diagonal coronary arteries.;  Surgeon: Grace Isaac, MD;  Location: [place];  Service: Open Heart Surgery;  Laterality: N/A;   LEFT HEART CATH AND CORONARY ANGIOGRAPHY N/A 02/20/2017   Procedure: LEFT HEART CATH AND CORONARY ANGIOGRAPHY;  Surgeon: Isaias Cowman, MD;  Location: [place] [facility];  Service: Cardiovascular;  Laterality: N/A;   LEFT HEART CATH AND CORS/GRAFTS ANGIOGRAPHY N/A 05/24/2017   Procedure: LEFT HEART CATH AND CORS/GRAFTS ANGIOGRAPHY;  Surgeon: Sherren Mocha, MD;  Location: [place] [facility];  Service: Cardiovascular;  Laterality: N/A;   NASAL SINUS SURGERY     POLYPECTOMY  01/17/2016   Procedure: POLYPECTOMY;  Surgeon: [name], MD;  Location: [facility];  Service: Endoscopy;;    RIGHT OOPHORECTOMY     TEE WITHOUT CARDIOVERSION N/A 02/20/2017   Procedure: TRANSESOPHAGEAL ECHOCARDIOGRAM (TEE);  Surgeon: Grace Isaac, MD;  Location: [place];  Service: Open Heart Surgery;  Laterality: N/A;   TOTAL SHOULDER ARTHROPLASTY Right 03/26/2022   Procedure: TOTAL SHOULDER ARTHROPLASTY- reverse;  Surgeon: Renee Harder, MD;  Location: [facility];  Service: Orthopedics;  Laterality: Right;    OB History     Gravida  1   Para  1   Term      Preterm      AB      Living         SAB      IAB      Ectopic      Multiple      Live Births               Home Medications    Prior to  Admission medications   Medication Sig Start Date End Date Taking? Authorizing Provider  acyclovir (ZOVIRAX) 400 MG tablet Take 400 mg by mouth 3 times per day for 5 days to start immediately with each episode of fever blister. May also take one a day for prevention. 01/09/21   Ralene Bathe, MD  albuterol (VENTOLIN HFA) 108 (90 Base) MCG/ACT inhaler INHALE 2 PUFFS BY MOUTH EVERY 4 TO 6 HOURS AS NEEDED FOR SHORTNESS OF BREATH 08/15/18   [name], MD  apixaban (ELIQUIS) 5 MG TABS tablet Take 1 tablet (5 mg total) by mouth 2 (two) times daily. 10/24/17   Gladstone Lighter, MD  ascorbic acid (VITAMIN C) 500 MG tablet Take 2,000 mg by mouth daily.     [provider]  aspirin 81 MG tablet Take 1 tablet (81 mg total) by mouth daily. 10/24/17   Gladstone Lighter, MD  buPROPion (WELLBUTRIN XL) 300 MG 24 hr tablet Take 1 tablet (300 mg total) by mouth daily. 03/24/22   Simmons-Robinson, Riki Sheer, MD  Cholecalciferol (VITAMIN D) 2000 units tablet Take 2,000 Units by mouth daily.    [provider]  CRANBERRY PO Take 1 tablet by mouth daily.    [provider]  Cyanocobalamin (B-12) 500 MCG TABS Take 500 mcg by mouth daily.     [provider]  dextromethorphan-guaiFENesin (MUCINEX DM) 30-600 MG 12hr tablet Take 1 tablet by mouth 2 (two) times  daily as needed for cough. 03/27/22   Emeterio Reeve, DO  diazepam (VALIUM) 5 MG tablet Take 1 tablet (5 mg total) by mouth every 12 (twelve) hours as needed for anxiety. 05/01/22   Virginia Crews, MD  ELDERBERRY PO Take by mouth daily. Unsure dose / combination vitamin    [provider]  EPINEPHrine (EPIPEN 2-PAK) 0.3 mg/0.3 mL IJ SOAJ injection INJECT AS DIRECTED FOR SEVERE ALLERGIC REACTION Patient taking differently: Inject 0.3 mg into the muscle as needed (for severe allergic reaction). 12/17/16   Mar Daring, PA-C  fluocinonide gel (LIDEX) 9.51 % Apply 1 application topically as needed. 10/22/18   [provider]  isosorbide mononitrate (IMDUR) 30 MG 24 hr tablet Take 30 mg by mouth daily. 06/01/20   [provider]  levothyroxine (SYNTHROID) 100 MCG tablet TAKE 1 TABLET BY MOUTH DAILY 12/22/21   [name], MD  menthol-cetylpyridinium (CEPACOL) 3 MG lozenge Take 1 lozenge (3 mg total) by mouth as needed for sore throat. 03/27/22   Emeterio Reeve, DO  methocarbamol (ROBAXIN) 500 MG tablet Take 1 tablet (500 mg total) by mouth every 8 (eight) hours as needed for muscle spasms. 03/27/22   Emeterio Reeve, DO  metoprolol tartrate (LOPRESSOR) 25 MG tablet One and one half tabs twice daily Patient taking differently: Take 25-50 mg by mouth 2 (two) times daily. 50 mg qam and 25 mg qpm 03/26/21   [name], MD  mometasone (ELOCON) 0.1 % cream Apply 1 application topically daily as needed ([place]). 02/17/21   Ralene Bathe, MD  Multiple Vitamin (MULTI-VITAMINS) TABS Take 1 tablet by mouth daily.    [provider]  oxymetazoline (AFRIN) 0.05 % nasal spray Place 1 spray into both nostrils as needed for congestion.    [provider]  Pyridoxine HCl (VITAMIN B-6 PO) Take 1 tablet by mouth daily.    [provider]  rosuvastatin (CRESTOR) 20 MG tablet TAKE ONE TABLET (20 MG) BY MOUTH EVERY EVENING 12/04/21    Eulas Post  Brooke Bonito., MD  sertraline (ZOLOFT) 25 MG tablet TAKE 1 TABLET BY MOUTH DAILY. SCHEDULE APPT FOR MORE REFILLS 04/13/22   Simmons-Robinson, Riki Sheer, MD  tacrolimus (PROTOPIC) 0.1 % ointment As needed for rosacea flare ups 11/24/18   [provider]  tacrolimus (PROTOPIC) 0.1 % ointment Apply qd to bid qd prn to lips for dryness 01/15/22   Ralene Bathe, MD  zinc gluconate 50 MG tablet Take 50 mg by mouth daily.     [provider]  zolpidem (AMBIEN) 5 MG tablet Take 1 tablet (5 mg total) by mouth at bedtime as needed. for sleep 05/01/22   Virginia Crews, MD    Family History Family History  Problem Relation Age of Onset   Alzheimer's disease Mother    Kidney cancer Mother    Heart attack Father    Pancreatic cancer Sister    Heart attack Brother 34   Ovarian cancer Sister    Kidney disease Sister        dialysis   Cancer Sister        uterin   Diabetes Sister    Heart attack Sister    Heart attack Brother 65   Ovarian cancer Sister     Social History Social History   Tobacco Use   Smoking status: Former    Types: Cigarettes    Quit date: 05/26/1975    Years since quitting: 46.9   Smokeless tobacco: Never   Tobacco comments:    quit 45  years ago   Vaping Use   Vaping Use: Never used  Substance Use Topics   Alcohol use: No   Drug use: No     Allergies   Cephalexin, Atorvastatin, Morphine and related, Other, Procaine, Shellfish allergy, Statins, Tomato, and Penicillins   Review of Systems Review of Systems   Physical Exam Triage Vital Signs ED Triage Vitals  Enc Vitals Group     BP      Pulse      Resp      Temp      Temp src      SpO2      Weight      Height      Head Circumference      Peak Flow      Pain Score      Pain Loc      Pain Edu?      Excl. in [place]?    No data found.  Updated Vital Signs There were no vitals taken for this visit.  Visual Acuity Right Eye Distance:   Left Eye Distance:   Bilateral  Distance:    Right Eye Near:   Left Eye Near:    Bilateral Near:     Physical Exam Vitals reviewed.  Constitutional:      Appearance: Normal appearance.  Skin:    General: Skin is warm and dry.  Neurological:     General: No focal deficit present.     Mental Status: She is alert and oriented to person, place, and time.  Psychiatric:        Mood and Affect: Mood normal.      UC Treatments / Results  Labs (all labs ordered are listed, but only abnormal results are displayed) Labs Reviewed  POCT URINALYSIS DIP (MANUAL ENTRY) - Abnormal; Notable for the following components:      Result Value   Clarity, UA cloudy (*)    Blood, UA moderate (*)    Protein Ur, POC  trace (*)    Leukocytes, UA Large (3+) (*)    All other components within normal limits    EKG   Radiology No results found.  Procedures Procedures (including critical care time)  Medications Ordered in UC Medications - No data to display  Initial Impression / Assessment and Plan / UC Course  I have reviewed the triage vital signs and the nursing notes.  Pertinent labs & imaging results that were available during my care of the patient were reviewed by me and considered in my medical decision making (see chart for details).   Patient is afebrile here without recent antipyretics. Satting well on room air. Overall is well appearing, well hydrated, without respiratory distress.   UA is positive for large leukocytes and moderate blood. Urine is cloudy.  Treating for acute cystitis with hematuria with Bactrim which she previously has tolerated and was effective with the most recent treatment in March 2023.  Sending sample for culture to verify susceptibility to this antibiotic.  Asked patient to follow-up with her primary care or urology provider.  Final Clinical Impressions(s) / UC Diagnoses   Final diagnoses:  Acute cystitis with hematuria   Discharge Instructions   None    ED Prescriptions   None     PDMP not reviewed this encounter.   Rose Phi, [place] 05/03/22 [phone]

## 2022-05-03 NOTE — Discharge Instructions (Signed)
Follow up with your primary care or urology provider if your symptoms are worsening or not improving with treatment.

## 2022-05-05 LAB — URINE CULTURE: Culture: 40000 — AB

## 2022-05-06 ENCOUNTER — Telehealth: Payer: Self-pay | Admitting: Family Medicine

## 2022-05-06 NOTE — Telephone Encounter (Signed)
[facility] faxed refill request for the following medications:    zolpidem (AMBIEN) 5 MG tablet    Please advise

## 2022-05-06 NOTE — Telephone Encounter (Signed)
Script was refilled 5 days ago.

## 2022-05-07 ENCOUNTER — Ambulatory Visit: Payer: PPO | Admitting: Dermatology

## 2022-05-07 ENCOUNTER — Encounter: Payer: PPO | Admitting: Family Medicine

## 2022-05-07 DIAGNOSIS — N1831 Chronic kidney disease, stage 3a: Secondary | ICD-10-CM | POA: Diagnosis not present

## 2022-05-07 DIAGNOSIS — N390 Urinary tract infection, site not specified: Secondary | ICD-10-CM | POA: Diagnosis not present

## 2022-05-07 DIAGNOSIS — M25611 Stiffness of right shoulder, not elsewhere classified: Secondary | ICD-10-CM | POA: Diagnosis not present

## 2022-05-07 DIAGNOSIS — I10 Essential (primary) hypertension: Secondary | ICD-10-CM | POA: Diagnosis not present

## 2022-05-07 DIAGNOSIS — M25511 Pain in right shoulder: Secondary | ICD-10-CM | POA: Diagnosis not present

## 2022-05-08 ENCOUNTER — Encounter: Payer: PPO | Admitting: Family Medicine

## 2022-05-14 DIAGNOSIS — M25611 Stiffness of right shoulder, not elsewhere classified: Secondary | ICD-10-CM | POA: Diagnosis not present

## 2022-05-14 DIAGNOSIS — M25511 Pain in right shoulder: Secondary | ICD-10-CM | POA: Diagnosis not present

## 2022-05-15 ENCOUNTER — Ambulatory Visit (INDEPENDENT_AMBULATORY_CARE_PROVIDER_SITE_OTHER): Payer: PPO | Admitting: Family Medicine

## 2022-05-15 ENCOUNTER — Encounter: Payer: Self-pay | Admitting: Family Medicine

## 2022-05-15 VITALS — BP 125/56 | HR 72 | Temp 97.9°F | Wt 200.0 lb

## 2022-05-15 DIAGNOSIS — R600 Localized edema: Secondary | ICD-10-CM | POA: Diagnosis not present

## 2022-05-15 DIAGNOSIS — L03115 Cellulitis of right lower limb: Secondary | ICD-10-CM

## 2022-05-15 MED ORDER — DOXYCYCLINE HYCLATE 100 MG PO TABS: 100.0000 mg | ORAL_TABLET | Freq: Two times a day (BID) | ORAL | 0 refills | Status: DC

## 2022-05-15 MED ORDER — FUROSEMIDE 40 MG PO TABS: 40.0000 mg | ORAL_TABLET | ORAL | 3 refills | Status: DC

## 2022-05-15 NOTE — Progress Notes (Signed)
I,Connie R Striblin,acting as a Education administrator for Stacey Sprout, FNP.,have documented all relevant documentation on the behalf of Stacey Sprout, FNP,as directed by  Stacey Sprout, FNP while in the presence of Stacey Sprout, FNP.  Established patient visit  Patient: Stacey [name]   DOB: 02/01/1941   81 y.o. Female  MRN: [id number] Visit Date: 05/15/2022  Today's healthcare provider: [name], FNP  Re Introduced to nurse practitioner role and practice setting.  All questions answered.  Discussed provider/patient relationship and expectations.  Subjective   HPI  Cellulitis: Bilateral red rash and swelling in legs and ankles. Associated symptoms include leg pain.Pt denies fever or chills.  Pt states that the rash is warm to the touch and have began to itch.  Symptoms began 3 days ago Pt states she has been on UTI abx for 10 days   Medications: Outpatient Medications Prior to Visit  Medication Sig   acyclovir (ZOVIRAX) 400 MG tablet Take 400 mg by mouth 3 times per day for 5 days to start immediately with each episode of fever blister. May also take one a day for prevention.   albuterol (VENTOLIN HFA) 108 (90 Base) MCG/ACT inhaler INHALE 2 PUFFS BY MOUTH EVERY 4 TO 6 HOURS AS NEEDED FOR SHORTNESS OF BREATH   amLODipine (NORVASC) 5 MG tablet Take 1 tablet by mouth daily.   apixaban (ELIQUIS) 5 MG TABS tablet Take 1 tablet (5 mg total) by mouth 2 (two) times daily.   ascorbic acid (VITAMIN C) 500 MG tablet Take 2,000 mg by mouth daily.    aspirin 81 MG tablet Take 1 tablet (81 mg total) by mouth daily.   buPROPion (WELLBUTRIN XL) 300 MG 24 hr tablet Take 1 tablet (300 mg total) by mouth daily.   chlorthalidone (HYGROTON) 25 MG tablet Take 0.5 tablets by mouth daily.   Cholecalciferol (VITAMIN D) 2000 units tablet Take 2,000 Units by mouth daily.   CRANBERRY PO Take 1 tablet by mouth daily.   Cyanocobalamin (B-12) 500 MCG TABS Take 500 mcg by mouth daily.     dextromethorphan-guaiFENesin (MUCINEX DM) 30-600 MG 12hr tablet Take 1 tablet by mouth 2 (two) times daily as needed for cough.   diazepam (VALIUM) 5 MG tablet Take 1 tablet (5 mg total) by mouth every 12 (twelve) hours as needed for anxiety.   ELDERBERRY PO Take by mouth daily. Unsure dose / combination vitamin   EPINEPHrine (EPIPEN 2-PAK) 0.3 mg/0.3 mL IJ SOAJ injection INJECT AS DIRECTED FOR SEVERE ALLERGIC REACTION (Patient taking differently: Inject 0.3 mg into the muscle as needed (for severe allergic reaction).)   fluocinonide gel (LIDEX) 4.28 % Apply 1 application topically as needed.   isosorbide mononitrate (IMDUR) 30 MG 24 hr tablet Take 30 mg by mouth daily.   levothyroxine (SYNTHROID) 100 MCG tablet TAKE 1 TABLET BY MOUTH DAILY   menthol-cetylpyridinium (CEPACOL) 3 MG lozenge Take 1 lozenge (3 mg total) by mouth as needed for sore throat.   methocarbamol (ROBAXIN) 500 MG tablet Take 1 tablet (500 mg total) by mouth every 8 (eight) hours as needed for muscle spasms.   metoprolol tartrate (LOPRESSOR) 25 MG tablet One and one half tabs twice daily (Patient taking differently: Take 25-50 mg by mouth 2 (two) times daily. 50 mg qam and 25 mg qpm)   mometasone (ELOCON) 0.1 % cream Apply 1 application topically daily as needed (Rash).   Multiple Vitamin (MULTI-VITAMINS) TABS Take 1 tablet by mouth daily.   oxymetazoline (  AFRIN) 0.05 % nasal spray Place 1 spray into both nostrils as needed for congestion.   Pyridoxine HCl (VITAMIN B-6 PO) Take 1 tablet by mouth daily.   rosuvastatin (CRESTOR) 20 MG tablet TAKE ONE TABLET (20 MG) BY MOUTH EVERY EVENING   sertraline (ZOLOFT) 25 MG tablet TAKE 1 TABLET BY MOUTH DAILY. SCHEDULE APPT FOR MORE REFILLS   tacrolimus (PROTOPIC) 0.1 % ointment As needed for rosacea flare ups   tacrolimus (PROTOPIC) 0.1 % ointment Apply qd to bid qd prn to lips for dryness   zinc gluconate 50 MG tablet Take 50 mg by mouth daily.    zolpidem (AMBIEN) 5 MG tablet Take 1  tablet (5 mg total) by mouth at bedtime as needed. for sleep   No facility-administered medications prior to visit.   Review of Systems    Objective    BP (!) 125/56 (BP Location: Left Arm, Patient Position: Sitting, Cuff Size: Normal)   Pulse 72   Temp 97.9 F (36.6 C) (Oral)   Wt 200 lb (90.7 kg)   SpO2 99%   BMI 36.58 kg/m   Physical Exam Vitals and nursing note reviewed.  Constitutional:      General: She is not in acute distress.    Appearance: Normal appearance. She is obese. She is not ill-appearing, toxic-appearing or diaphoretic.  HENT:     Head: Normocephalic and atraumatic.  Cardiovascular:     Rate and Rhythm: Normal rate and regular rhythm.     Pulses: Normal pulses.     Heart sounds: Normal heart sounds. No murmur heard.    No friction rub. No gallop.  Pulmonary:     Effort: Pulmonary effort is normal. No respiratory distress.     Breath sounds: Normal breath sounds. No stridor. No wheezing, rhonchi or rales.  Chest:     Chest wall: No tenderness.  Abdominal:     General: Bowel sounds are normal.     Palpations: Abdomen is soft.  Musculoskeletal:        General: No swelling, tenderness, deformity or signs of injury. Normal range of motion.     Right lower leg: 4+ Pitting Edema present.     Left lower leg: 2+ Pitting Edema present.  Skin:    General: Skin is warm and dry.     Capillary Refill: Capillary refill takes less than 2 seconds.     Coloration: Skin is not jaundiced or pale.     Findings: No bruising, erythema, lesion or rash.  Neurological:     General: No focal deficit present.     Mental Status: She is alert and oriented to person, place, and time. Mental status is at baseline.     Cranial Nerves: No cranial nerve deficit.     Sensory: No sensory deficit.     Motor: No weakness.     Coordination: Coordination normal.  Psychiatric:        Mood and Affect: Mood normal.        Behavior: Behavior normal.        Thought Content: Thought  content normal.        Judgment: Judgment normal.     No results found for any visits on 05/15/22.  Assessment & Plan     Problem List Items Addressed This Visit       Other   Bilateral leg edema - Primary    Acute, worsening RLE with cellulitis Will check BNP to assist given hx of cardiovascular concerns  Relevant Orders   B Nat Peptide   Cellulitis of right lower extremity    Acute, in setting of edema Will use both diuretic and abx to assist RTC on 12/29, 1 week for f/u at CPE      Relevant Orders   CBC   Comprehensive Metabolic Panel (CMET)   Magnesium   Return in about 1 week (around 05/22/2022), or if symptoms worsen or fail to improve.     Vonna Kotyk, FNP, have reviewed all documentation for this visit. The documentation on 05/15/22 for the exam, diagnosis, procedures, and orders are all accurate and complete.  Stacey [name], [facility] [phone] (phone) [phone] (fax)  [place]

## 2022-05-15 NOTE — Assessment & Plan Note (Signed)
Acute, in setting of edema Will use both diuretic and abx to assist RTC on 12/29, 1 week for f/u at CPE

## 2022-05-15 NOTE — Assessment & Plan Note (Signed)
Acute, worsening RLE with cellulitis Will check BNP to assist given hx of cardiovascular concerns

## 2022-05-16 LAB — CBC
Hematocrit: 38.4 % (ref 34.0–46.6)
Hemoglobin: 12.7 g/dL (ref 11.1–15.9)
MCH: 29.1 pg (ref 26.6–33.0)
MCHC: 33.1 g/dL (ref 31.5–35.7)
MCV: 88 fL (ref 79–97)
Platelets: 169 10*3/uL (ref 150–450)
RBC: 4.36 x10E6/uL (ref 3.77–5.28)
RDW: 12.6 % (ref 11.7–15.4)
WBC: 6.1 10*3/uL (ref 3.4–10.8)

## 2022-05-16 LAB — COMPREHENSIVE METABOLIC PANEL
ALT: 13 IU/L (ref 0–32)
AST: 18 IU/L (ref 0–40)
Albumin/Globulin Ratio: 2.7 — ABNORMAL HIGH (ref 1.2–2.2)
Albumin: 4 g/dL (ref 3.8–4.8)
Alkaline Phosphatase: 81 IU/L (ref 44–121)
BUN/Creatinine Ratio: 16 (ref 12–28)
BUN: 16 mg/dL (ref 8–27)
Bilirubin Total: 0.4 mg/dL (ref 0.0–1.2)
CO2: 29 mmol/L (ref 20–29)
Calcium: 9.2 mg/dL (ref 8.7–10.3)
Chloride: 98 mmol/L (ref 96–106)
Creatinine, Ser: 1.01 mg/dL — ABNORMAL HIGH (ref 0.57–1.00)
Globulin, Total: 1.5 g/dL (ref 1.5–4.5)
Glucose: 97 mg/dL (ref 70–99)
Potassium: 4 mmol/L (ref 3.5–5.2)
Sodium: 139 mmol/L (ref 134–144)
Total Protein: 5.5 g/dL — ABNORMAL LOW (ref 6.0–8.5)
eGFR: 57 mL/min/{1.73_m2} — ABNORMAL LOW (ref >59)

## 2022-05-16 LAB — MAGNESIUM: Magnesium: 1.9 mg/dL (ref 1.6–2.3)

## 2022-05-16 LAB — BRAIN NATRIURETIC PEPTIDE: BNP: 227.3 pg/mL — ABNORMAL HIGH (ref 0.0–100.0)

## 2022-05-19 ENCOUNTER — Ambulatory Visit: Payer: Self-pay

## 2022-05-19 NOTE — Telephone Encounter (Signed)
Patients husband [name] is calling stating that patient is having swelling and pain in both legs.  Please advise   Chief Complaint: Continues swelling to legs, "knees look worse." Asking if she can take Lasix every day instead of every other day. Symptoms: Above Frequency: 05/13/22 Pertinent Negatives: Patient denies SOB Disposition: '[]'$ ED /'[]'$ Urgent Care (no appt availability in office) / '[]'$ Appointment(In office/virtual)/ '[]'$  [facility]/ '[]'$ Home Care/ '[]'$ Refused Recommended Disposition /'[]'$ [facility]/ '[x]'$  Follow-up with PCP Additional Notes: Please advise pt.  Answer Assessment - Initial Assessment Questions 1. ONSET: "When did the swelling start?" (e.g., minutes, hours, days)     12/20/23Moderate 2. LOCATION: "What part of the leg is swollen?"  "Are both legs swollen or just one leg?"     Both knees are worse 3. SEVERITY: "How bad is the swelling?" (e.g., localized; mild, moderate, severe)   - Localized: Small area of swelling localized to one leg.   - MILD pedal edema: Swelling limited to foot and ankle, pitting edema < 1/4 inch (6 mm) deep, rest and elevation eliminate most or all swelling.   - MODERATE edema: Swelling of lower leg to knee, pitting edema > 1/4 inch (6 mm) deep, rest and elevation only partially reduce swelling.   - SEVERE edema: Swelling extends above knee, facial or hand swelling present.      Moderate 4. REDNESS: "Does the swelling look red or infected?"     Yes 5. PAIN: "Is the swelling painful to touch?" If Yes, ask: "How painful is it?"   (Scale 1-10; mild, moderate or severe)     Mild 6. FEVER: "Do you have a fever?" If Yes, ask: "What is it, how was it measured, and when did it start?"      No 7. CAUSE: "What do you think is causing the leg swelling?"     Unsure 8. MEDICAL HISTORY: "Do you have a history of blood clots (e.g., DVT), cancer, heart failure, kidney disease, or liver failure?"     No 9. RECURRENT SYMPTOM: "Have you had leg  swelling before?" If Yes, ask: "When was the last time?" "What happened that time?"     No 10. OTHER SYMPTOMS: "Do you have any other symptoms?" (e.g., chest pain, difficulty breathing)       No 11. PREGNANCY: "Is there any chance you are pregnant?" "When was your last menstrual period?"       No  Protocols used: Leg Swelling and Edema-A-AH

## 2022-05-19 NOTE — Progress Notes (Signed)
Slight rise in creatinine; otherwise labs remain stable.

## 2022-05-19 NOTE — Telephone Encounter (Signed)
05/22/22 appt, agree with in person assessment    If swelling/redness worsens or if patient develops fever or difficulty moving the involved lower extremity due to pain prior to appointment, recommend ED or UC evaluation.   Eulis Foster, MD  [facility]

## 2022-05-20 NOTE — Telephone Encounter (Signed)
Patient advised.

## 2022-05-21 NOTE — Progress Notes (Incomplete)
I,Joseline E Rosas,acting as a scribe for [employer], MD.,have documented all relevant documentation on the behalf of Eulis Foster, MD,as directed by  Eulis Foster, MD while in the presence of Eulis Foster, MD.   Complete Physical Exam     Patient: Stacey [name]   DOB: [birth year]   81 y.o. Female  MRN: [id number] Visit Date: 05/22/2022  Today's healthcare provider: Eulis Foster, MD   Chief Complaint  Patient presents with  . Annual Exam   Subjective    Stacey [name] is a 81 y.o. female who presents today for a complete physical exam.   She reports being limited in ability to participate in physical activity due to arthritis and always being in AF so she is easily short of breath while walking short distances (estimates 100 feet, or a flight of stairs she has to stop and rest 2-3 times)  She had a shoulder replacement on the right shoulder Mar 26 2022 and is not able to raise  to 90 deg,  She is still in PT for this problem She declines covid vaccines  She declines influenza vaccine this year  She would like to discuss Shingrix vaccine with cardiologist first  Recommended for updated tetanus vaccine    CKD  She is followed by Dr. Holley Raring  for this in [facility] [place]  She has limited diet due to renal disease. She has been avoiding certain fruits and vegetables   AF  Reports she is always in Afib   Follow Up Cellulitis of RLE  Patient was evaluated on 05/15/22 for cellulitis  She was started on abx and lasix to help with LE edema, hx is notable for mitral insufficiency  She had concern that the cellulitis was worsening   Today she states still the same. Doesn't see any change with the medications.   Past Medical History:  Diagnosis Date  . Abnormal nuclear stress test 05/24/2017  . Anxiety    a.) on BZO (diazepam) PRN  . Aortic atherosclerosis ([place])   . Arthritis   . Asthma   . Atrial fibrillation,  transient ([place])    a.) CHA2DS2VASc = 7 (age x 2, sex, HTN, CVA x 2, prior MI);  b.) rate/rhythm maintained on oral metoprolol tartrate; chronically anticoagulated with apixaban  . Benign neoplasm of ascending colon   . Benign neoplasm of descending colon   . CAD (coronary artery disease)    a.) LHC at [facility] 02/20/2017: 80% oRI-RI, 90% o-pLAD, 95% mLAD, 99% p-mRCA --> urgent CVTS consult; b.) 3v CABG at [facility]; c.) LHC 05/24/2017: 100% o-pLAD, 80% oRI-RI, 100% p-mRCA, SVG-RCA patent, SVG-oD1 with min luminal irregs, LIMA-LAD patent -- med mgmt.  . Chronic lower back pain   . CKD (chronic kidney disease) stage 3, GFR 30-59 ml/min (HCC)   . Complication of anesthesia   . DDD (degenerative disc disease), cervical   . Depression   . Dyspnea   . Family history of adverse reaction to anesthesia    most of family - PONV  . Fibromyalgia   . GERD (gastroesophageal reflux disease)   . Glaucoma   . History of 2019 novel coronavirus disease (COVID-19) 02/02/2019  . History of bilateral cataract extraction   . HTN (hypertension) 03/09/2007  . Hyperlipidemia LDL goal <70 03/25/1993  . Hypertension   . Hypothyroidism   . Insomnia    a.) on hypnotic (zolpidem) PRN  . Long term current use of anticoagulant    a.) apixaban  . Migraines  a.) 1-2x/mo  . Motion sickness    all moving vehicles  . NSTEMI (non-ST elevated myocardial infarction) ([place]) 02/20/2017   a.) LHC at [facility] 02/20/2017: 80% oRI-RI, 90% o-pLAD, 95% mLAD, 99% p-mRCA --> urgent CVTS consult; b.) 3v CABG at [facility]  . OSA on CPAP   . PONV (postoperative nausea and vomiting)   . S/P CABG x 3    a.) LIMA-LAD, SVG-dRCA, SVG-D1  . Squamous cell carcinoma of skin 07/25/2019   Left upper lateral lip above vermillion border. WD SCC with superficial infiltration., [facility] 10/02/19  . Stroke ([place]) 10/22/2017   a.) CT/MRI 10/22/2017 - RIGHT occipitoparietal infarct  . Vitamin D deficiency    Past Surgical History:  Procedure Laterality Date  .  ABDOMINAL HYSTERECTOMY    . APPENDECTOMY    . BLADDER SURGERY    . CATARACT EXTRACTION W/ INTRAOCULAR LENS  IMPLANT, BILATERAL    . COLONOSCOPY WITH PROPOFOL N/A 01/17/2016   Procedure: COLONOSCOPY WITH PROPOFOL;  Surgeon: [name], MD;  Location: [place];  Service: Endoscopy;  Laterality: N/A;  . CORONARY ARTERY BYPASS GRAFT N/A 02/20/2017   Procedure: CORONARY ARTERY BYPASS GRAFTING (CABG) x , three using left internal mammary artery to left anterior descending coronary artery and right greater saphenous vein harvested endoscopically to distal right and diagonal coronary arteries.;  Surgeon: Grace Isaac, MD;  Location: [place];  Service: Open Heart Surgery;  Laterality: N/A;  . LEFT HEART CATH AND CORONARY ANGIOGRAPHY N/A 02/20/2017   Procedure: LEFT HEART CATH AND CORONARY ANGIOGRAPHY;  Surgeon: Isaias Cowman, MD;  Location: [place] CV [facility];  Service: Cardiovascular;  Laterality: N/A;  . LEFT HEART CATH AND CORS/GRAFTS ANGIOGRAPHY N/A 05/24/2017   Procedure: LEFT HEART CATH AND CORS/GRAFTS ANGIOGRAPHY;  Surgeon: Sherren Mocha, MD;  Location: [place] CV [facility];  Service: Cardiovascular;  Laterality: N/A;  . NASAL SINUS SURGERY    . POLYPECTOMY  01/17/2016   Procedure: POLYPECTOMY;  Surgeon: [name], MD;  Location: [place];  Service: Endoscopy;;  . RIGHT OOPHORECTOMY    . TEE WITHOUT CARDIOVERSION N/A 02/20/2017   Procedure: TRANSESOPHAGEAL ECHOCARDIOGRAM (TEE);  Surgeon: Grace Isaac, MD;  Location: [place];  Service: Open Heart Surgery;  Laterality: N/A;  . TOTAL SHOULDER ARTHROPLASTY Right 03/26/2022   Procedure: TOTAL SHOULDER ARTHROPLASTY- reverse;  Surgeon: Renee Harder, MD;  Location: [facility];  Service: Orthopedics;  Laterality: Right;   Social History   Socioeconomic History  . Marital status: Married    Spouse name: [name]  . Number of children: 3  . Years of education: Not on file  . Highest education level: GED or equivalent   Occupational History    Employer: [employer]  Tobacco Use  . Smoking status: Former    Types: Cigarettes    Quit date: 05/26/1975    Years since quitting: 47.0  . Smokeless tobacco: Never  . Tobacco comments:    quit 45  years ago   Vaping Use  . Vaping Use: Never used  Substance and Sexual Activity  . Alcohol use: No  . Drug use: No  . Sexual activity: Not Currently  Other Topics Concern  . Not on file  Social History Narrative  . Not on file   Social Determinants of Health   Financial Resource Strain: Low Risk  (08/14/2021)   Overall Financial Resource Strain (CARDIA)   . Difficulty of Paying Living Expenses: Not hard at all  Food Insecurity: No Food Insecurity (03/26/2022)   Hunger  Vital Sign   . Worried About Charity fundraiser in the Last Year: Never true   . Ran Out of Food in the Last Year: Never true  Transportation Needs: No Transportation Needs (03/26/2022)   PRAPARE - Transportation   . Lack of Transportation (Medical): No   . Lack of Transportation (Non-Medical): No  Physical Activity: Insufficiently Active (08/14/2021)   Exercise Vital Sign   . Days of Exercise per Week: 2 days   . Minutes of Exercise per Session: 20 min  Stress: No Stress Concern Present (08/14/2021)   [place]   . Feeling of Stress : Only a little  Social Connections: Socially Integrated (08/14/2021)   Social Connection and Isolation Panel [NHANES]   . Frequency of Communication with Friends and Family: Three times a week   . Frequency of Social Gatherings with Friends and Family: Once a week   . Attends Religious Services: More than 4 times per year   . Active Member of Clubs or Organizations: Yes   . Attends Archivist Meetings: More than 4 times per year   . Marital Status: Married  Human resources officer Violence: Not At Risk (03/26/2022)   Humiliation, Afraid, Rape, and Kick questionnaire   . Fear of  Current or Ex-Partner: No   . Emotionally Abused: No   . Physically Abused: No   . Sexually Abused: No   Family Status  Relation Name Status  . Mother  Deceased  . Father  Deceased  . Sister  Deceased  . Brother  Deceased  . Sister  Deceased  . Brother  Deceased  . Sister vicky Deceased   Family History  Problem Relation Age of Onset  . Alzheimer's disease Mother   . Kidney cancer Mother   . Heart attack Father   . Pancreatic cancer Sister   . Heart attack Brother 74  . Ovarian cancer Sister   . Kidney disease Sister        dialysis  . Cancer Sister        uterin  . Diabetes Sister   . Heart attack Sister   . Heart attack Brother 14  . Ovarian cancer Sister    Allergies  Allergen Reactions  . Cephalexin Anaphylaxis  . Atorvastatin     myalgia  . Morphine And Related Hives and Nausea And Vomiting  . Other Hives and Other (See Comments)    Muscle pain  . Procaine Other (See Comments)    Chest pain Chest pain  . Shellfish Allergy Swelling    angioedema  . Statins Other (See Comments)    Muscle pain  . Tomato Other (See Comments)    (by testing) - also beans, wheat, peas (by testing) - also beans, wheat, peas  . Penicillins Hives and Rash    Has patient had a PCN reaction causing immediate rash, facial/tongue/throat swelling, SOB or lightheadedness with hypotension: Yes Has patient had a PCN reaction causing severe rash involving mucus membranes or skin necrosis: No Has patient had a PCN reaction that required hospitalization: No Has patient had a PCN reaction occurring within the last 10 years: Yes If all of the above answers are "NO", then may proceed with Cephalosporin use.    Patient Care Team: Eulis Foster, MD as PCP - General (Family Medicine) Leandrew Koyanagi, MD as Referring Physician (Ophthalmology) Corey Skains, MD as Consulting Physician (Cardiology) Beverly Gust, MD (Otolaryngology) Germaine Pomfret, [facility]  (Pharmacist) Valente David,  RN as [facility]   Medications: Outpatient Medications Prior to Visit  Medication Sig  . acyclovir (ZOVIRAX) 400 MG tablet Take 400 mg by mouth 3 times per day for 5 days to start immediately with each episode of fever blister. May also take one a day for prevention.  Marland Kitchen albuterol (VENTOLIN HFA) 108 (90 Base) MCG/ACT inhaler INHALE 2 PUFFS BY MOUTH EVERY 4 TO 6 HOURS AS NEEDED FOR SHORTNESS OF BREATH  . amLODipine (NORVASC) 5 MG tablet Take 1 tablet by mouth daily.  Marland Kitchen apixaban (ELIQUIS) 5 MG TABS tablet Take 1 tablet (5 mg total) by mouth 2 (two) times daily.  Marland Kitchen ascorbic acid (VITAMIN C) 500 MG tablet Take 2,000 mg by mouth daily.   Marland Kitchen aspirin 81 MG tablet Take 1 tablet (81 mg total) by mouth daily.  Marland Kitchen buPROPion (WELLBUTRIN XL) 300 MG 24 hr tablet Take 1 tablet (300 mg total) by mouth daily.  . chlorthalidone (HYGROTON) 25 MG tablet Take 0.5 tablets by mouth daily.  . Cholecalciferol (VITAMIN D) 2000 units tablet Take 2,000 Units by mouth daily.  Marland Kitchen CRANBERRY PO Take 1 tablet by mouth daily.  . Cyanocobalamin (B-12) 500 MCG TABS Take 500 mcg by mouth daily.   . diazepam (VALIUM) 5 MG tablet Take 1 tablet (5 mg total) by mouth every 12 (twelve) hours as needed for anxiety.  Marland Kitchen ELDERBERRY PO Take by mouth daily. Unsure dose / combination vitamin  . EPINEPHrine (EPIPEN 2-PAK) 0.3 mg/0.3 mL IJ SOAJ injection INJECT AS DIRECTED FOR SEVERE ALLERGIC REACTION (Patient taking differently: Inject 0.3 mg into the muscle as needed (for severe allergic reaction).)  . fluocinonide gel (LIDEX) 2.70 % Apply 1 application topically as needed.  . furosemide (LASIX) 40 MG tablet Take 1 tablet (40 mg total) by mouth every other day.  . isosorbide mononitrate (IMDUR) 30 MG 24 hr tablet Take 30 mg by mouth daily.  Marland Kitchen levothyroxine (SYNTHROID) 100 MCG tablet TAKE 1 TABLET BY MOUTH DAILY  . menthol-cetylpyridinium (CEPACOL) 3 MG lozenge Take 1 lozenge (3 mg total)  by mouth as needed for sore throat.  . metoprolol tartrate (LOPRESSOR) 25 MG tablet One and one half tabs twice daily (Patient taking differently: Take 25-50 mg by mouth 2 (two) times daily. 50 mg qam and 25 mg qpm)  . mometasone (ELOCON) 0.1 % cream Apply 1 application topically daily as needed (Rash).  . Multiple Vitamin (MULTI-VITAMINS) TABS Take 1 tablet by mouth daily.  Marland Kitchen oxymetazoline (AFRIN) 0.05 % nasal spray Place 1 spray into both nostrils as needed for congestion.  . Pyridoxine HCl (VITAMIN B-6 PO) Take 1 tablet by mouth daily.  . rosuvastatin (CRESTOR) 20 MG tablet TAKE ONE TABLET (20 MG) BY MOUTH EVERY EVENING  . sertraline (ZOLOFT) 25 MG tablet TAKE 1 TABLET BY MOUTH DAILY. SCHEDULE APPT FOR MORE REFILLS  . tacrolimus (PROTOPIC) 0.1 % ointment As needed for rosacea flare ups  . tacrolimus (PROTOPIC) 0.1 % ointment Apply qd to bid qd prn to lips for dryness  . zinc gluconate 50 MG tablet Take 50 mg by mouth daily.   Marland Kitchen zolpidem (AMBIEN) 5 MG tablet Take 1 tablet (5 mg total) by mouth at bedtime as needed. for sleep  . [DISCONTINUED] doxycycline (VIBRA-TABS) 100 MG tablet Take 1 tablet (100 mg total) by mouth 2 (two) times daily.  Marland Kitchen dextromethorphan-guaiFENesin (MUCINEX DM) 30-600 MG 12hr tablet Take 1 tablet by mouth 2 (two) times daily as needed for cough.  . methocarbamol (  ROBAXIN) 500 MG tablet Take 1 tablet (500 mg total) by mouth every 8 (eight) hours as needed for muscle spasms. (Patient not taking: Reported on 05/22/2022)   No facility-administered medications prior to visit.    Review of Systems  {Labs  Heme  Chem  Endocrine  Serology  Results Review (optional):23779}  Objective    BP 104/66 (BP Location: Left Arm, Patient Position: Sitting, Cuff Size: Large)   Pulse (!) 58   Resp 16   Ht 5' 1.5" (1.562 m)   Wt 199 lb 14.4 oz (90.7 kg)   BMI 37.16 kg/m  {Show previous vital signs (optional):23777}   Physical Exam Constitutional:      General: She is not in  acute distress.    Appearance: She is obese. She is not ill-appearing, toxic-appearing or diaphoretic.  HENT:     Nose: No congestion or rhinorrhea.  Eyes:     General: No scleral icterus.       Right eye: No discharge.        Left eye: No discharge.     Extraocular Movements: Extraocular movements intact.     Conjunctiva/sclera: Conjunctivae normal.     Pupils: Pupils are equal, round, and reactive to light.  Cardiovascular:     Rate and Rhythm: Normal rate. Rhythm irregular.  Pulmonary:     Effort: Pulmonary effort is normal. No respiratory distress.     Breath sounds: Normal breath sounds. No wheezing or rales.  Abdominal:     General: Bowel sounds are normal. There is no distension.     Palpations: Abdomen is soft.     Tenderness: There is no abdominal tenderness.  Musculoskeletal:        General: Swelling, tenderness and deformity present.     Right lower leg: Edema present.     Left lower leg: Edema present.     Comments: RLE calf diameter is 41cm  LLE calf diameter is 37cm   Skin:    General: Skin is warm.     Coloration: Skin is not jaundiced.     Findings: No erythema, lesion or rash.     Comments: Erythema of right lower extremity is greater than left  Neurological:     General: No focal deficit present.     Mental Status: She is alert and oriented to person, place, and time.     Cranial Nerves: No cranial nerve deficit.     Sensory: No sensory deficit.     Gait: Gait normal.         Last depression screening scores    05/22/2022   10:16 AM 11/06/2021    8:15 AM 08/14/2021    9:55 AM  PHQ 2/9 Scores  PHQ - 2 Score  1 1  PHQ- 9 Score  10   Exception Documentation Patient refusal     Last fall risk screening    11/06/2021    8:15 AM  [place] in the past year? 0  Number falls in past yr: 0  Injury with Fall? 0  Risk for fall due to : No Fall Risks  Follow up Falls evaluation completed   Last Audit-C alcohol use screening    11/06/2021     8:15 AM  Alcohol Use Disorder Test (AUDIT)  1. How often do you have a drink containing alcohol? 0  2. How many drinks containing alcohol do you have on a typical day when you are drinking? 0  3. How often do you  have six or more drinks on one occasion? 0  AUDIT-C Score 0   A score of 3 or more in women, and 4 or more in men indicates increased risk for alcohol abuse, EXCEPT if all of the points are from question 1   No results found for any visits on 05/22/22.  Assessment & Plan       Immunization History  Administered Date(s) Administered  . Fluad Quad(high Dose 65+) 02/21/2019  . Influenza, High Dose Seasonal PF 02/17/2017, 02/14/2018  . Influenza,inj,Quad PF,6+ Mos 01/24/2015  . Influenza-Unspecified 02/16/2017  . Pneumococcal Conjugate-13 11/22/2013  . Pneumococcal Polysaccharide-23 03/30/2005, 11/19/2011  . Tdap 10/01/2005  . Zoster, Live 11/19/2011    Health Maintenance  Topic Date Due  . Zoster Vaccines- Shingrix (1 of 2) Never done  . DTaP/Tdap/Td (2 - Td or Tdap) 10/02/2015  . MAMMOGRAM  02/29/2020  . COVID-19 Vaccine (1) 06/07/2022 (Originally 08/15/1945)  . INFLUENZA VACCINE  08/23/2022 (Originally 12/23/2021)  . Medicare Annual Wellness (AWV)  08/15/2022  . Pneumonia Vaccine 17+ Years old  Completed  . DEXA SCAN  Completed  . HPV VACCINES  Aged Out    Discussed health benefits of physical activity, and encouraged her to engage in regular exercise appropriate for her age and condition.  Problem List Items Addressed This Visit       Cardiovascular and Mediastinum   Essential hypertension   Relevant Orders   Comprehensive metabolic panel   PAF (paroxysmal atrial fibrillation) ([place])     Endocrine   Adult hypothyroidism   Relevant Orders   TSH + free T4     Genitourinary   Chronic kidney disease, stage 3a ([place])   Relevant Orders   Comprehensive metabolic panel     Other   Hyperlipidemia LDL goal <70   Relevant Orders   Lipid panel   Cellulitis of  right lower extremity   Relevant Orders   CBC   Other Visit Diagnoses     Annual physical exam    -  Primary   Encounter for screening mammogram for malignant neoplasm of breast       Relevant Orders   MM 3D SCREEN BREAST BILATERAL   Right leg swelling       Relevant Orders   VAS [place] LOWER EXTREMITY VENOUS (DVT)   B Nat Peptide        Return in about 10 days (around 06/01/2022) for follow up leg .    I, Eulis Foster, MD, have reviewed all documentation for this visit.  Portions of this information were initially documented by the CMA and reviewed by me for thoroughness and accuracy.      Eulis Foster, MD  [facility] [phone] (phone) [phone] (fax)  [place]

## 2022-05-22 ENCOUNTER — Encounter: Payer: Self-pay | Admitting: Family Medicine

## 2022-05-22 ENCOUNTER — Ambulatory Visit
Admission: RE | Admit: 2022-05-22 | Discharge: 2022-05-22 | Disposition: A | Discharge status: Home / Self Care | Payer: PPO | Source: Ambulatory Visit | Attending: Family Medicine | Admitting: Family Medicine

## 2022-05-22 ENCOUNTER — Ambulatory Visit (INDEPENDENT_AMBULATORY_CARE_PROVIDER_SITE_OTHER): Payer: PPO | Admitting: Family Medicine

## 2022-05-22 VITALS — BP 104/66 | HR 58 | Resp 16 | Ht 61.5 in | Wt 199.9 lb

## 2022-05-22 DIAGNOSIS — Z0000 Encounter for general adult medical examination without abnormal findings: Secondary | ICD-10-CM | POA: Diagnosis not present

## 2022-05-22 DIAGNOSIS — M7989 Other specified soft tissue disorders: Secondary | ICD-10-CM | POA: Insufficient documentation

## 2022-05-22 DIAGNOSIS — L03115 Cellulitis of right lower limb: Secondary | ICD-10-CM

## 2022-05-22 DIAGNOSIS — M79661 Pain in right lower leg: Secondary | ICD-10-CM | POA: Diagnosis not present

## 2022-05-22 DIAGNOSIS — E785 Hyperlipidemia, unspecified: Secondary | ICD-10-CM

## 2022-05-22 DIAGNOSIS — E039 Hypothyroidism, unspecified: Secondary | ICD-10-CM | POA: Diagnosis not present

## 2022-05-22 DIAGNOSIS — I480 Paroxysmal atrial fibrillation: Secondary | ICD-10-CM

## 2022-05-22 DIAGNOSIS — R600 Localized edema: Secondary | ICD-10-CM | POA: Diagnosis not present

## 2022-05-22 DIAGNOSIS — Z1231 Encounter for screening mammogram for malignant neoplasm of breast: Secondary | ICD-10-CM

## 2022-05-22 DIAGNOSIS — I10 Essential (primary) hypertension: Secondary | ICD-10-CM | POA: Diagnosis not present

## 2022-05-22 DIAGNOSIS — N1831 Chronic kidney disease, stage 3a: Secondary | ICD-10-CM

## 2022-05-22 MED ORDER — SULFAMETHOXAZOLE-TRIMETHOPRIM 800-160 MG PO TABS: 1.0000 | ORAL_TABLET | Freq: Two times a day (BID) | ORAL | 0 refills | Status: AC

## 2022-05-22 NOTE — Patient Instructions (Signed)
Continue lasix every other day   Stop doxycycline and start bactrim for next 10 days   Follow up with me in 10 days, up to 2 weeks   Please go to ED if you develop difficulty breathing, have worsening swelling or redness, or see the redness streaking upwards past the knee of have fevers or chills

## 2022-05-23 LAB — TSH+FREE T4
Free T4: 1.5 ng/dL (ref 0.82–1.77)
TSH: 2.25 u[IU]/mL (ref 0.450–4.500)

## 2022-05-23 LAB — CBC
Hematocrit: 40.4 % (ref 34.0–46.6)
Hemoglobin: 13.4 g/dL (ref 11.1–15.9)
MCH: 29.2 pg (ref 26.6–33.0)
MCHC: 33.2 g/dL (ref 31.5–35.7)
MCV: 88 fL (ref 79–97)
Platelets: 180 10*3/uL (ref 150–450)
RBC: 4.59 x10E6/uL (ref 3.77–5.28)
RDW: 13 % (ref 11.7–15.4)
WBC: 7.7 10*3/uL (ref 3.4–10.8)

## 2022-05-23 LAB — COMPREHENSIVE METABOLIC PANEL
ALT: 11 IU/L (ref 0–32)
AST: 17 IU/L (ref 0–40)
Albumin/Globulin Ratio: 2.1 (ref 1.2–2.2)
Albumin: 3.9 g/dL (ref 3.8–4.8)
Alkaline Phosphatase: 88 IU/L (ref 44–121)
BUN/Creatinine Ratio: 22 (ref 12–28)
BUN: 23 mg/dL (ref 8–27)
Bilirubin Total: 0.4 mg/dL (ref 0.0–1.2)
CO2: 28 mmol/L (ref 20–29)
Calcium: 9.2 mg/dL (ref 8.7–10.3)
Chloride: 94 mmol/L — ABNORMAL LOW (ref 96–106)
Creatinine, Ser: 1.06 mg/dL — ABNORMAL HIGH (ref 0.57–1.00)
Globulin, Total: 1.9 g/dL (ref 1.5–4.5)
Glucose: 128 mg/dL — ABNORMAL HIGH (ref 70–99)
Potassium: 3.4 mmol/L — ABNORMAL LOW (ref 3.5–5.2)
Sodium: 141 mmol/L (ref 134–144)
Total Protein: 5.8 g/dL — ABNORMAL LOW (ref 6.0–8.5)
eGFR: 53 mL/min/{1.73_m2} — ABNORMAL LOW (ref >59)

## 2022-05-23 LAB — LIPID PANEL
Chol/HDL Ratio: 2.9 ratio (ref 0.0–4.4)
Cholesterol, Total: 157 mg/dL (ref 100–199)
HDL: 55 mg/dL (ref >39)
LDL Chol Calc (NIH): 77 mg/dL (ref 0–99)
Triglycerides: 148 mg/dL (ref 0–149)
VLDL Cholesterol Cal: 25 mg/dL (ref 5–40)

## 2022-05-23 LAB — BRAIN NATRIURETIC PEPTIDE: BNP: 163.2 pg/mL — ABNORMAL HIGH (ref 0.0–100.0)

## 2022-05-23 NOTE — Assessment & Plan Note (Signed)
B/P controlled 

## 2022-05-25 DIAGNOSIS — Z0000 Encounter for general adult medical examination without abnormal findings: Secondary | ICD-10-CM | POA: Insufficient documentation

## 2022-05-25 NOTE — Assessment & Plan Note (Addendum)
Recommended treatment for cellulitis with different abx  Recommended DVT US given RLE swelling with diameter of 41cm vs 37 cm for LLE  DVT US ordered Patient will continue lasix '40mg'$  every other day  Repeat BMP today to check creatinine  Will also check BNP

## 2022-05-25 NOTE — Assessment & Plan Note (Signed)
RLE with increased swelling and redness compared to LLE  Will change to Bactrim for 10 day course based on previous treatment for similar problem (patient and husband report resolution of symptoms previously)

## 2022-05-25 NOTE — Assessment & Plan Note (Signed)
Mammogram ordered  Discussed age appropriate vaccines including shingrix, influenza, tetanus booster and COVID boosters

## 2022-05-25 NOTE — Assessment & Plan Note (Signed)
Lipid panel ordered today  Continue rosuvastatin '20mg'$  daily

## 2022-05-25 NOTE — Assessment & Plan Note (Signed)
Reviewed last metabolic panel, 83/72/90, last creatinine was 1.01  BMP  CBC

## 2022-06-01 ENCOUNTER — Telehealth: Payer: Self-pay

## 2022-06-01 NOTE — Telephone Encounter (Signed)
Surgery rescheduled. aw

## 2022-06-01 NOTE — Telephone Encounter (Signed)
Patient's spouse called regarding patient having cellulitis in legs and currently on antibiotics. Patient is scheduled for surgery with you first thing in the AM for cyst removal on the L calf. Should we reschedule?

## 2022-06-02 ENCOUNTER — Encounter: Payer: PPO | Admitting: Dermatology

## 2022-06-04 NOTE — Progress Notes (Signed)
I,Joseline E Rosas,acting as a scribe for [employer], MD.,have documented all relevant documentation on the behalf of Eulis Foster, MD,as directed by  Eulis Foster, MD while in the presence of Eulis Foster, MD.   Established patient visit   Patient: Stacey [name]   DOB: 03-26-1941   81 y.o. Female  MRN: [id number] Visit Date: 06/05/2022  Today's healthcare provider: Eulis Foster, MD   Chief Complaint  Patient presents with   Cellulitis   Subjective    HPI  Follow up for Cellulitis  The patient was last seen for this 10 days ago. Changes made at last visit include Bactrim for 10 day course, lasix every other day and DVT US (negative)  She prepares her own meals and does not add salt  She reports that her breathing feels about the same, has not had increased SOB except when she walks for a while or long distances  She has been taking the lasix every other day   She reports excellent compliance with treatment. She feels that condition is Unchanged. -----------------------------------------------------------------------------------------   Medications: Outpatient Medications Prior to Visit  Medication Sig   acyclovir (ZOVIRAX) 400 MG tablet Take 400 mg by mouth 3 times per day for 5 days to start immediately with each episode of fever blister. May also take one a day for prevention.   albuterol (VENTOLIN HFA) 108 (90 Base) MCG/ACT inhaler INHALE 2 PUFFS BY MOUTH EVERY 4 TO 6 HOURS AS NEEDED FOR SHORTNESS OF BREATH   amLODipine (NORVASC) 5 MG tablet Take 1 tablet by mouth daily.   apixaban (ELIQUIS) 5 MG TABS tablet Take 1 tablet (5 mg total) by mouth 2 (two) times daily.   ascorbic acid (VITAMIN C) 500 MG tablet Take 2,000 mg by mouth daily.    aspirin 81 MG tablet Take 1 tablet (81 mg total) by mouth daily.   buPROPion (WELLBUTRIN XL) 300 MG 24 hr tablet Take 1 tablet (300 mg total) by mouth daily.    chlorthalidone (HYGROTON) 25 MG tablet Take 0.5 tablets by mouth daily.   Cholecalciferol (VITAMIN D) 2000 units tablet Take 2,000 Units by mouth daily.   CRANBERRY PO Take 1 tablet by mouth daily.   Cyanocobalamin (B-12) 500 MCG TABS Take 500 mcg by mouth daily.    diazepam (VALIUM) 5 MG tablet Take 1 tablet (5 mg total) by mouth every 12 (twelve) hours as needed for anxiety.   ELDERBERRY PO Take by mouth daily. Unsure dose / combination vitamin   EPINEPHrine (EPIPEN 2-PAK) 0.3 mg/0.3 mL IJ SOAJ injection INJECT AS DIRECTED FOR SEVERE ALLERGIC REACTION (Patient taking differently: Inject 0.3 mg into the muscle as needed (for severe allergic reaction).)   fluocinonide gel (LIDEX) 8.12 % Apply 1 application topically as needed.   furosemide (LASIX) 40 MG tablet Take 1 tablet (40 mg total) by mouth every other day.   isosorbide mononitrate (IMDUR) 30 MG 24 hr tablet Take 30 mg by mouth daily.   levothyroxine (SYNTHROID) 100 MCG tablet TAKE 1 TABLET BY MOUTH DAILY   menthol-cetylpyridinium (CEPACOL) 3 MG lozenge Take 1 lozenge (3 mg total) by mouth as needed for sore throat.   methocarbamol (ROBAXIN) 500 MG tablet Take 1 tablet (500 mg total) by mouth every 8 (eight) hours as needed for muscle spasms.   metoprolol tartrate (LOPRESSOR) 25 MG tablet One and one half tabs twice daily (Patient taking differently: Take 25-50 mg by mouth 2 (two) times daily. 50 mg qam and 25 mg qpm)  mometasone (ELOCON) 0.1 % cream Apply 1 application topically daily as needed (Rash).   Multiple Vitamin (MULTI-VITAMINS) TABS Take 1 tablet by mouth daily.   oxymetazoline (AFRIN) 0.05 % nasal spray Place 1 spray into both nostrils as needed for congestion.   Pyridoxine HCl (VITAMIN B-6 PO) Take 1 tablet by mouth daily.   rosuvastatin (CRESTOR) 20 MG tablet TAKE ONE TABLET (20 MG) BY MOUTH EVERY EVENING   sertraline (ZOLOFT) 25 MG tablet TAKE 1 TABLET BY MOUTH DAILY. SCHEDULE APPT FOR MORE REFILLS   tacrolimus (PROTOPIC)  0.1 % ointment As needed for rosacea flare ups   tacrolimus (PROTOPIC) 0.1 % ointment Apply qd to bid qd prn to lips for dryness   zinc gluconate 50 MG tablet Take 50 mg by mouth daily.    zolpidem (AMBIEN) 5 MG tablet Take 1 tablet (5 mg total) by mouth at bedtime as needed. for sleep   dextromethorphan-guaiFENesin (MUCINEX DM) 30-600 MG 12hr tablet Take 1 tablet by mouth 2 (two) times daily as needed for cough. (Patient not taking: Reported on 06/05/2022)   sulfamethoxazole-trimethoprim (BACTRIM DS) 800-160 MG tablet Take 1 tablet by mouth 2 (two) times daily. (Patient not taking: Reported on 06/05/2022)   No facility-administered medications prior to visit.    Review of Systems     Objective    BP 134/75 (BP Location: Left Arm, Patient Position: Sitting, Cuff Size: Large)   Pulse 82   Temp 97.9 F (36.6 C) (Oral)   Resp 16   Ht 5' 1.5" (1.562 m)   Wt 197 lb 4.8 oz (89.5 kg)   BMI 36.68 kg/m    Physical Exam Vitals reviewed.  Constitutional:      General: She is not in acute distress.    Appearance: Normal appearance. She is not ill-appearing, toxic-appearing or diaphoretic.  Eyes:     Conjunctiva/sclera: Conjunctivae normal.  Cardiovascular:     Rate and Rhythm: Normal rate and regular rhythm.     Pulses: Normal pulses.     Heart sounds: Normal heart sounds. No murmur heard.    No friction rub. No gallop.  Pulmonary:     Effort: Pulmonary effort is normal. No respiratory distress.     Breath sounds: Normal breath sounds. No stridor. No wheezing, rhonchi or rales.  Abdominal:     General: Bowel sounds are normal. There is no distension.     Palpations: Abdomen is soft.     Tenderness: There is no abdominal tenderness.  Musculoskeletal:     Right lower leg: Edema present.     Left lower leg: Edema present.     Comments: RLE > LLE with pitting 2+ edema compared to 1+ on the left   Skin:    Findings: No erythema or rash.  Neurological:     Mental Status: She is alert  and oriented to person, place, and time.     No results found for any visits on 06/05/22.  Assessment & Plan     Problem List Items Addressed This Visit       Other   Bilateral leg edema - Primary    Minimal improvement in RLE edema  Discussed that we have covered for cellulitis and will not do additional abx, pt voiced understanding  Will increase to daily lasix from today until 06/09/22 and then have her resume her every other day dosing  Will collect BMP and BNP today  Pulmonary exam is not consistent with HF exacerbation and patient denies worsening respiratory status  If not improvement at two week follow up, will refer to lymphedema clinic        Relevant Orders   Basic Metabolic Panel (BMET)   B Nat Peptide     Return in about 2 weeks (around 06/19/2022) for leg swelling .      I, Eulis Foster, MD, have reviewed all documentation for this visit.  Portions of this information were initially documented by the CMA and reviewed by me for thoroughness and accuracy.      Eulis Foster, MD  [facility] [phone] (phone) [phone] (fax)  [place]

## 2022-06-05 ENCOUNTER — Encounter: Payer: Self-pay | Admitting: Family Medicine

## 2022-06-05 ENCOUNTER — Telehealth: Payer: Self-pay | Admitting: Family Medicine

## 2022-06-05 ENCOUNTER — Ambulatory Visit (INDEPENDENT_AMBULATORY_CARE_PROVIDER_SITE_OTHER): Payer: Medicare HMO | Admitting: Family Medicine

## 2022-06-05 VITALS — BP 134/75 | HR 82 | Temp 97.9°F | Resp 16 | Ht 61.5 in | Wt 197.3 lb

## 2022-06-05 DIAGNOSIS — R600 Localized edema: Secondary | ICD-10-CM

## 2022-06-05 MED ORDER — LEVOTHYROXINE SODIUM 100 MCG PO TABS: 100.0000 ug | ORAL_TABLET | Freq: Every day | ORAL | 1 refills | Status: AC

## 2022-06-05 NOTE — Assessment & Plan Note (Signed)
Minimal improvement in RLE edema  Discussed that we have covered for cellulitis and will not do additional abx, pt voiced understanding  Will increase to daily lasix from today until 06/09/22 and then have her resume her every other day dosing  Will collect BMP and BNP today  Pulmonary exam is not consistent with HF exacerbation and patient denies worsening respiratory status  If not improvement at two week follow up, will refer to lymphedema clinic

## 2022-06-05 NOTE — Patient Instructions (Addendum)
Please take your Lasix every day starting today and then skip on Lasix on 06/09/22 and restart your every other day regimen on 06/10/22    We will check labs today and follow up in 2 weeks after you have seen your nephrologist

## 2022-06-05 NOTE — Telephone Encounter (Signed)
[facility] pharmacy faxed refill request for the following medications:    levothyroxine (SYNTHROID) 100 MCG tablet  Please advise

## 2022-06-27 ENCOUNTER — Other Ambulatory Visit: Payer: Self-pay | Admitting: Family Medicine

## 2022-06-30 ENCOUNTER — Telehealth: Payer: Self-pay

## 2022-06-30 ENCOUNTER — Ambulatory Visit: Payer: Medicare HMO | Admitting: Dermatology

## 2022-06-30 DIAGNOSIS — D485 Neoplasm of uncertain behavior of skin: Secondary | ICD-10-CM

## 2022-06-30 DIAGNOSIS — C4A72 Merkel cell carcinoma of left lower limb, including hip: Secondary | ICD-10-CM

## 2022-06-30 MED ORDER — DOXYCYCLINE HYCLATE 100 MG PO TABS: 100.0000 mg | ORAL_TABLET | Freq: Two times a day (BID) | ORAL | 0 refills | Status: AC

## 2022-06-30 MED ORDER — MUPIROCIN 2 % EX OINT: 1.0000 | TOPICAL_OINTMENT | Freq: Every day | CUTANEOUS | 0 refills | Status: AC

## 2022-06-30 NOTE — Telephone Encounter (Signed)
Left pt a message to call if any problems after today's surgery.Stacey [name]

## 2022-06-30 NOTE — Patient Instructions (Addendum)
Wound Care Instructions  Cleanse wound gently with soap and water once a day then pat dry with clean gauze. Apply a thin coat of Petrolatum (petroleum jelly, "Vaseline") over the wound (unless you have an allergy to this). We recommend that you use a new, sterile tube of Vaseline. Do not pick or remove scabs. Do not remove the yellow or white "healing tissue" from the base of the wound.  Cover the wound with fresh, clean, nonstick gauze and secure with paper tape. You may use Band-Aids in place of gauze and tape if the wound is small enough, but would recommend trimming much of the tape off as there is often too much. Sometimes Band-Aids can irritate the skin.  You should call the office for your biopsy report after 1 week if you have not already been contacted.  If you experience any problems, such as abnormal amounts of bleeding, swelling, significant bruising, significant pain, or evidence of infection, please call the office immediately.  FOR ADULT SURGERY PATIENTS: If you need something for pain relief you may take 1 extra strength Tylenol (acetaminophen) AND 2 Ibuprofen (200mg each) together every 4 hours as needed for pain. (do not take these if you are allergic to them or if you have a reason you should not take them.) Typically, you may only need pain medication for 1 to 3 days.     Due to recent changes in healthcare laws, you may see results of your pathology and/or laboratory studies on MyChart before the doctors have had a chance to review them. We understand that in some cases there may be results that are confusing or concerning to you. Please understand that not all results are received at the same time and often the doctors may need to interpret multiple results in order to provide you with the best plan of care or course of treatment. Therefore, we ask that you please give us 2 business days to thoroughly review all your results before contacting the office for clarification. Should  we see a critical lab result, you will be contacted sooner.   If You Need Anything After Your Visit  If you have any questions or concerns for your doctor, please call our main line at [phone] and press option 4 to reach your doctor's medical assistant. If no one answers, please leave a voicemail as directed and we will return your call as soon as possible. Messages left after 4 pm will be answered the following business day.   You may also send us a message via MyChart. We typically respond to MyChart messages within 1-2 business days.  For prescription refills, please ask your pharmacy to contact our office. Our fax number is [phone].  If you have an urgent issue when the clinic is closed that cannot wait until the next business day, you can page your doctor at the number below.    Please note that while we do our best to be available for urgent issues outside of office hours, we are not available 24/7.   If you have an urgent issue and are unable to reach us, you may choose to seek medical care at your doctor's office, retail clinic, urgent care center, or emergency room.  If you have a medical emergency, please immediately call 911 or go to the emergency department.  Pager Numbers  - Dr. Kowalski: [phone]  - Dr. Moye: [phone]  - Dr. Stewart: [phone]  In the event of inclement weather, please call our main line at   [phone] for an update on the status of any delays or closures.  Dermatology Medication Tips: Please keep the boxes that topical medications come in in order to help keep track of the instructions about where and how to use these. Pharmacies typically print the medication instructions only on the boxes and not directly on the medication tubes.   If your medication is too expensive, please contact our office at [phone] option 4 or send us a message through MyChart.   We are unable to tell what your co-pay for medications will be in  advance as this is different depending on your insurance coverage. However, we may be able to find a substitute medication at lower cost or fill out paperwork to get insurance to cover a needed medication.   If a prior authorization is required to get your medication covered by your insurance company, please allow us 1-2 business days to complete this process.  Drug prices often vary depending on where the prescription is filled and some pharmacies may offer cheaper prices.  The website www.goodrx.com contains coupons for medications through different pharmacies. The prices here do not account for what the cost may be with help from insurance (it may be cheaper with your insurance), but the website can give you the price if you did not use any insurance.  - You can print the associated coupon and take it with your prescription to the pharmacy.  - You may also stop by our office during regular business hours and pick up a GoodRx coupon card.  - If you need your prescription sent electronically to a different pharmacy, notify our office through [facility] MyChart or by phone at [phone] option 4.     Si Usted Necesita Algo Despus de Su Visita  Tambin puede enviarnos un mensaje a travs de MyChart. Por lo general respondemos a los mensajes de MyChart en el transcurso de 1 a 2 das hbiles.  Para renovar recetas, por favor pida a su farmacia que se ponga en contacto con nuestra oficina. Nuestro nmero de fax es el [phone].  Si tiene un asunto urgente cuando la clnica est cerrada y que no puede esperar hasta el siguiente da hbil, puede llamar/localizar a su doctor(a) al nmero que aparece a continuacin.   Por favor, tenga en cuenta que aunque hacemos todo lo posible para estar disponibles para asuntos urgentes fuera del horario de oficina, no estamos disponibles las 24 horas del da, los 7 das de la semana.   Si tiene un problema urgente y no puede comunicarse con nosotros, puede  optar por buscar atencin mdica  en el consultorio de su doctor(a), en una clnica privada, en un centro de atencin urgente o en una sala de emergencias.  Si tiene una emergencia mdica, por favor llame inmediatamente al 911 o vaya a la sala de emergencias.  Nmeros de bper  - Dr. Kowalski: [phone]  - Dra. Moye: [phone]  - Dra. Stewart: [phone]  En caso de inclemencias del tiempo, por favor llame a nuestra lnea principal al [phone] para una actualizacin sobre el estado de cualquier retraso o cierre.  Consejos para la medicacin en dermatologa: Por favor, guarde las cajas en las que vienen los medicamentos de uso tpico para ayudarle a seguir las instrucciones sobre dnde y cmo usarlos. Las farmacias generalmente imprimen las instrucciones del medicamento slo en las cajas y no directamente en los tubos del medicamento.   Si su medicamento es muy caro, por favor, pngase en contacto con   nuestra oficina llamando al [phone] y presione la opcin 4 o envenos un mensaje a travs de MyChart.   No podemos decirle cul ser su copago por los medicamentos por adelantado ya que esto es diferente dependiendo de la cobertura de su seguro. Sin embargo, es posible que podamos encontrar un medicamento sustituto a menor costo o llenar un formulario para que el seguro cubra el medicamento que se considera necesario.   Si se requiere una autorizacin previa para que su compaa de seguros cubra su medicamento, por favor permtanos de 1 a 2 das hbiles para completar este proceso.  Los precios de los medicamentos varan con frecuencia dependiendo del lugar de dnde se surte la receta y alguna farmacias pueden ofrecer precios ms baratos.  El sitio web www.goodrx.com tiene cupones para medicamentos de diferentes farmacias. Los precios aqu no tienen en cuenta lo que podra costar con la ayuda del seguro (puede ser ms barato con su seguro), pero el sitio web puede darle el  precio si no utiliz ningn seguro.  - Puede imprimir el cupn correspondiente y llevarlo con su receta a la farmacia.  - Tambin puede pasar por nuestra oficina durante el horario de atencin regular y recoger una tarjeta de cupones de GoodRx.  - Si necesita que su receta se enve electrnicamente a una farmacia diferente, informe a nuestra oficina a travs de MyChart de [facility] o por telfono llamando al [phone] y presione la opcin 4.  

## 2022-06-30 NOTE — Progress Notes (Addendum)
   Follow-Up Visit   Subjective  Stacey [name] is a 82 y.o. female who presents for the following: Cyst vs other (L calf, pt presents for excision).  The following portions of the chart were reviewed this encounter and updated as appropriate:   Tobacco  Allergies  Meds  Problems  Med Hx  Surg Hx  Fam Hx     Review of Systems:  No other skin or systemic complaints except as noted in HPI or Assessment and Plan.  Objective  Well appearing patient in no apparent distress; mood and affect are within normal limits.  A focused examination was performed including left calf. Relevant physical exam findings are noted in the Assessment and Plan.  Left calf Cystic papule 3.1 x 2.2 cm   Assessment & Plan  Neoplasm of uncertain behavior of skin Left calf  Skin excision  Lesion length (cm):  3.1 Lesion width (cm):  2.2 Margin per side (cm):  0 Total excision diameter (cm):  3.1 Informed consent: discussed and consent obtained   Timeout: patient name, date of birth, surgical site, and procedure verified   Procedure prep:  Patient was prepped and draped in usual sterile fashion Prep type:  Isopropyl alcohol and povidone-iodine Anesthesia: the lesion was anesthetized in a standard fashion   Anesthetic:  1% lidocaine w/ epinephrine 1-100,000 buffered w/ 8.4% NaHCO3 Instrument used: #15 blade   Hemostasis achieved with: pressure   Hemostasis achieved with comment:  Electrocautery Outcome: patient tolerated procedure well with no complications   Post-procedure details: sterile dressing applied and wound care instructions given   Dressing type: bandage and pressure dressing (mupirocin)    Skin repair Complexity:  Complex Final length (cm):  3.5 Reason for type of repair: reduce tension to allow closure, reduce the risk of dehiscence, infection, and necrosis, reduce subcutaneous dead space and avoid a hematoma, allow closure of the large defect, preserve normal anatomy, preserve  normal anatomical and functional relationships and enhance both functionality and cosmetic results   Undermining: area extensively undermined   Undermining comment:  Undermining defect 3.1 cm Subcutaneous layers (deep stitches):  Suture size:  3-0 Suture type: Vicryl (polyglactin 910)   Subcutaneous suture technique: inverted dermal. Fine/surface layer approximation (top stitches):  Suture size:  3-0 Suture type: nylon   Stitches: simple running   Suture removal (days):  7 Hemostasis achieved with: suture and pressure Outcome: patient tolerated procedure well with no complications   Post-procedure details: sterile dressing applied and wound care instructions given   Dressing type: bandage and pressure dressing (mupirocin)    mupirocin ointment (BACTROBAN) 2 % Apply 1 Application topically daily. With dressing changes  doxycycline (VIBRA-TABS) 100 MG tablet Take 1 tablet (100 mg total) by mouth 2 (two) times daily. With food and plenty of fluid  Specimen 1 - Surgical pathology Differential Diagnosis: D48.5 Cyst vs other  Check Margins: No Cystic pap  Cyst vs other L calf, excised today Start Mupirocin oint qd to excision site   Return in about 1 week (around 07/07/2022) for suture removal.  I, Ashok Cordia, CMA, am acting as scribe for Sarina Ser, MD . Documentation: I have reviewed the above documentation for accuracy and completeness, and I agree with the above.  Sarina Ser, MD

## 2022-07-01 ENCOUNTER — Encounter: Payer: Self-pay | Admitting: Dermatology

## 2022-07-02 ENCOUNTER — Ambulatory Visit (INDEPENDENT_AMBULATORY_CARE_PROVIDER_SITE_OTHER): Payer: Medicare HMO

## 2022-07-02 DIAGNOSIS — Z4802 Encounter for removal of sutures: Secondary | ICD-10-CM

## 2022-07-02 DIAGNOSIS — L720 Epidermal cyst: Secondary | ICD-10-CM

## 2022-07-02 NOTE — Progress Notes (Signed)
   Follow-Up Visit   Subjective  Stacey [name] is a 82 y.o. female who presents for the following: dressing change  The following portions of the chart were reviewed this encounter and updated as appropriate:      Review of Systems: No other skin or systemic complaints.  Objective  Well appearing patient in no apparent distress; mood and affect are within normal limits.  A focused examination was performed including left calf. Relevant physical exam findings are noted in the Assessment and Plan.   Assessment & Plan  Epidermal cyst Left calf  Patient here today for two day dressing change to excision site. Area cleaned with Puracyn, applied mupirocin ointment with telfa and wrapped with coban.   Johnsie Kindred, RMA

## 2022-07-07 ENCOUNTER — Ambulatory Visit (INDEPENDENT_AMBULATORY_CARE_PROVIDER_SITE_OTHER): Payer: Medicare HMO | Admitting: Dermatology

## 2022-07-07 ENCOUNTER — Ambulatory Visit: Payer: PPO

## 2022-07-07 DIAGNOSIS — C4A9 Merkel cell carcinoma, unspecified: Secondary | ICD-10-CM

## 2022-07-07 NOTE — Progress Notes (Unsigned)
   Follow-Up Visit   Subjective  Stacey [name] is a 82 y.o. 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 Lesion presented clinically as a cyst.   The following portions of the chart were reviewed this encounter and updated as appropriate:       Review of Systems:  No other skin or systemic complaints except as noted in HPI or Assessment and Plan.  Objective  Well appearing patient in no apparent distress; mood and affect are within normal limits.  A focused examination was performed including left lower leg. Relevant physical exam findings are noted in the Assessment and Plan.  Left calf Healing excision site is clean, dry and intact. No lymphadenopathy of left groin or left popliteal.    Assessment & Plan  Merkel cell carcinoma (HCC) Left calf  Ambulatory referral to Surgical Oncology   Encounter for Removal of Sutures - Incision site at the left calf is clean, dry and intact - Wound cleansed, sutures removed, wound cleansed and steri strips applied.  - Discussed pathology results showing Merkle Cell Carcinoma  - Patient advised to keep steri-strips dry until they fall off. - Scars remodel for a full year. - Once steri-strips fall off, patient can apply over-the-counter silicone scar cream each night to help with scar remodeling if desired. - Patient advised to call with any concerns or if they notice any new or changing lesions.   Dr Nicole Kindred discussed pathology results and treatment options with patient. Will refer her to Dr Freddi Che at [facility] for [facility] and lymphoscintigraphy and further work-up/treatment as indicated.   Return in about 3 months (around 10/05/2022) for Follow up.  I, Ashok Cordia, CMA, am acting as scribe for Brendolyn Patty, MD .  Documentation: I have reviewed the above documentation for accuracy and completeness, and I agree with the above.  Brendolyn Patty MD

## 2022-07-07 NOTE — Patient Instructions (Signed)
Due to recent changes in healthcare laws, you may see results of your pathology and/or laboratory studies on MyChart before the doctors have had a chance to review them. We understand that in some cases there may be results that are confusing or concerning to you. Please understand that not all results are received at the same time and often the doctors may need to interpret multiple results in order to provide you with the best plan of care or course of treatment. Therefore, we ask that you please give us 2 business days to thoroughly review all your results before contacting the office for clarification. Should we see a critical lab result, you will be contacted sooner.   If You Need Anything After Your Visit  If you have any questions or concerns for your doctor, please call our main line at [phone] and press option 4 to reach your doctor's medical assistant. If no one answers, please leave a voicemail as directed and we will return your call as soon as possible. Messages left after 4 pm will be answered the following business day.   You may also send us a message via MyChart. We typically respond to MyChart messages within 1-2 business days.  For prescription refills, please ask your pharmacy to contact our office. Our fax number is [phone].  If you have an urgent issue when the clinic is closed that cannot wait until the next business day, you can page your doctor at the number below.    Please note that while we do our best to be available for urgent issues outside of office hours, we are not available 24/7.   If you have an urgent issue and are unable to reach us, you may choose to seek medical care at your doctor's office, retail clinic, urgent care center, or emergency room.  If you have a medical emergency, please immediately call 911 or go to the emergency department.  Pager Numbers  - Dr. Kowalski: [phone]  - Dr. Moye: [phone]  - Dr. Stewart:  [phone]  In the event of inclement weather, please call our main line at [phone] for an update on the status of any delays or closures.  Dermatology Medication Tips: Please keep the boxes that topical medications come in in order to help keep track of the instructions about where and how to use these. Pharmacies typically print the medication instructions only on the boxes and not directly on the medication tubes.   If your medication is too expensive, please contact our office at [phone] option 4 or send us a message through MyChart.   We are unable to tell what your co-pay for medications will be in advance as this is different depending on your insurance coverage. However, we may be able to find a substitute medication at lower cost or fill out paperwork to get insurance to cover a needed medication.   If a prior authorization is required to get your medication covered by your insurance company, please allow us 1-2 business days to complete this process.  Drug prices often vary depending on where the prescription is filled and some pharmacies may offer cheaper prices.  The website www.goodrx.com contains coupons for medications through different pharmacies. The prices here do not account for what the cost may be with help from insurance (it may be cheaper with your insurance), but the website can give you the price if you did not use any insurance.  - You can print the associated coupon and take it with   your prescription to the pharmacy.  - You may also stop by our office during regular business hours and pick up a GoodRx coupon card.  - If you need your prescription sent electronically to a different pharmacy, notify our office through [place] MyChart or by phone at [phone] option 4.     Si Usted Necesita Algo Despus de Su Visita  Tambin puede enviarnos un mensaje a travs de MyChart. Por lo general respondemos a los mensajes de MyChart en el transcurso de 1 a 2  das hbiles.  Para renovar recetas, por favor pida a su farmacia que se ponga en contacto con nuestra oficina. Nuestro nmero de fax es el [phone].  Si tiene un asunto urgente cuando la clnica est cerrada y que no puede esperar hasta el siguiente da hbil, puede llamar/localizar a su doctor(a) al nmero que aparece a continuacin.   Por favor, tenga en cuenta que aunque hacemos todo lo posible para estar disponibles para asuntos urgentes fuera del horario de oficina, no estamos disponibles las 24 horas del da, los 7 das de la semana.   Si tiene un problema urgente y no puede comunicarse con nosotros, puede optar por buscar atencin mdica  en el consultorio de su doctor(a), en una clnica privada, en un centro de atencin urgente o en una sala de emergencias.  Si tiene una emergencia mdica, por favor llame inmediatamente al 911 o vaya a la sala de emergencias.  Nmeros de bper  - Dr. Kowalski: [phone]  - Dra. Moye: [phone]  - Dra. Stewart: [phone]  En caso de inclemencias del tiempo, por favor llame a nuestra lnea principal al [phone] para una actualizacin sobre el estado de cualquier retraso o cierre.  Consejos para la medicacin en dermatologa: Por favor, guarde las cajas en las que vienen los medicamentos de uso tpico para ayudarle a seguir las instrucciones sobre dnde y cmo usarlos. Las farmacias generalmente imprimen las instrucciones del medicamento slo en las cajas y no directamente en los tubos del medicamento.   Si su medicamento es muy caro, por favor, pngase en contacto con nuestra oficina llamando al [phone] y presione la opcin 4 o envenos un mensaje a travs de MyChart.   No podemos decirle cul ser su copago por los medicamentos por adelantado ya que esto es diferente dependiendo de la cobertura de su seguro. Sin embargo, es posible que podamos encontrar un medicamento sustituto a menor costo o llenar un formulario para que el  seguro cubra el medicamento que se considera necesario.   Si se requiere una autorizacin previa para que su compaa de seguros cubra su medicamento, por favor permtanos de 1 a 2 das hbiles para completar este proceso.  Los precios de los medicamentos varan con frecuencia dependiendo del lugar de dnde se surte la receta y alguna farmacias pueden ofrecer precios ms baratos.  El sitio web www.goodrx.com tiene cupones para medicamentos de diferentes farmacias. Los precios aqu no tienen en cuenta lo que podra costar con la ayuda del seguro (puede ser ms barato con su seguro), pero el sitio web puede darle el precio si no utiliz ningn seguro.  - Puede imprimir el cupn correspondiente y llevarlo con su receta a la farmacia.  - Tambin puede pasar por nuestra oficina durante el horario de atencin regular y recoger una tarjeta de cupones de GoodRx.  - Si necesita que su receta se enve electrnicamente a una farmacia diferente, informe a nuestra oficina a travs de MyChart de [place]   o por telfono llamando al [phone] y presione la opcin 4.  

## 2022-07-23 DIAGNOSIS — C4A9 Merkel cell carcinoma, unspecified: Secondary | ICD-10-CM

## 2022-07-23 HISTORY — DX: Merkel cell carcinoma, unspecified: C4A.9

## 2022-09-16 ENCOUNTER — Encounter: Payer: Self-pay | Admitting: Family Medicine

## 2022-09-19 ENCOUNTER — Other Ambulatory Visit: Payer: Self-pay | Admitting: Family Medicine

## 2022-10-01 ENCOUNTER — Telehealth: Payer: Self-pay | Admitting: Family Medicine

## 2022-10-01 NOTE — Telephone Encounter (Signed)
Copied from CRM [id number]. Topic: Medicare AWV >> Oct 01, 2022  2:37 PM Rushie Goltz wrote: Reason for CRM: Called patient to schedule Medicare Annual Wellness Visit (AWV). Left message for patient to call back and schedule Medicare Annual Wellness Visit (AWV).  Last date of AWV: 08/14/2021  Please schedule an AWVS appointment at any time with [facility] ANNUAL WELLNESS VISIT.  If any questions, please contact me at [phone].    Thank you,  [facility] Support [facility] Direct dial  [phone]

## 2022-10-15 ENCOUNTER — Ambulatory Visit: Payer: Medicare HMO | Admitting: Dermatology

## 2022-10-15 ENCOUNTER — Encounter: Payer: Self-pay | Admitting: Dermatology

## 2022-10-15 VITALS — BP 134/74

## 2022-10-15 DIAGNOSIS — Z85828 Personal history of other malignant neoplasm of skin: Secondary | ICD-10-CM

## 2022-10-15 DIAGNOSIS — W908XXA Exposure to other nonionizing radiation, initial encounter: Secondary | ICD-10-CM | POA: Diagnosis not present

## 2022-10-15 DIAGNOSIS — X32XXXA Exposure to sunlight, initial encounter: Secondary | ICD-10-CM

## 2022-10-15 DIAGNOSIS — Z1283 Encounter for screening for malignant neoplasm of skin: Secondary | ICD-10-CM

## 2022-10-15 DIAGNOSIS — D692 Other nonthrombocytopenic purpura: Secondary | ICD-10-CM

## 2022-10-15 DIAGNOSIS — L570 Actinic keratosis: Secondary | ICD-10-CM

## 2022-10-15 DIAGNOSIS — Z85821 Personal history of Merkel cell carcinoma: Secondary | ICD-10-CM

## 2022-10-15 DIAGNOSIS — L578 Other skin changes due to chronic exposure to nonionizing radiation: Secondary | ICD-10-CM

## 2022-10-15 DIAGNOSIS — L814 Other melanin hyperpigmentation: Secondary | ICD-10-CM

## 2022-10-15 DIAGNOSIS — Z872 Personal history of diseases of the skin and subcutaneous tissue: Secondary | ICD-10-CM

## 2022-10-15 DIAGNOSIS — D1801 Hemangioma of skin and subcutaneous tissue: Secondary | ICD-10-CM

## 2022-10-15 DIAGNOSIS — D229 Melanocytic nevi, unspecified: Secondary | ICD-10-CM

## 2022-10-15 DIAGNOSIS — L821 Other seborrheic keratosis: Secondary | ICD-10-CM

## 2022-10-15 DIAGNOSIS — Z8589 Personal history of malignant neoplasm of other organs and systems: Secondary | ICD-10-CM

## 2022-10-15 NOTE — Patient Instructions (Addendum)
Cryotherapy Aftercare  Wash gently with soap and water everyday.   Apply Vaseline and Band-Aid daily until healed.     Due to recent changes in healthcare laws, you may see results of your pathology and/or laboratory studies on MyChart before the doctors have had a chance to review them. We understand that in some cases there may be results that are confusing or concerning to you. Please understand that not all results are received at the same time and often the doctors may need to interpret multiple results in order to provide you with the best plan of care or course of treatment. Therefore, we ask that you please give us 2 business days to thoroughly review all your results before contacting the office for clarification. Should we see a critical lab result, you will be contacted sooner.   If You Need Anything After Your Visit  If you have any questions or concerns for your doctor, please call our main line at [phone] and press option 4 to reach your doctor's medical assistant. If no one answers, please leave a voicemail as directed and we will return your call as soon as possible. Messages left after 4 pm will be answered the following business day.   You may also send us a message via MyChart. We typically respond to MyChart messages within 1-2 business days.  For prescription refills, please ask your pharmacy to contact our office. Our fax number is [phone].  If you have an urgent issue when the clinic is closed that cannot wait until the next business day, you can page your doctor at the number below.    Please note that while we do our best to be available for urgent issues outside of office hours, we are not available 24/7.   If you have an urgent issue and are unable to reach us, you may choose to seek medical care at your doctor's office, retail clinic, urgent care center, or emergency room.  If you have a medical emergency, please immediately call 911 or go to the  emergency department.  Pager Numbers  - Dr. Kowalski: [phone]  - Dr. Moye: [phone]  - Dr. Stewart: [phone]  In the event of inclement weather, please call our main line at [phone] for an update on the status of any delays or closures.  Dermatology Medication Tips: Please keep the boxes that topical medications come in in order to help keep track of the instructions about where and how to use these. Pharmacies typically print the medication instructions only on the boxes and not directly on the medication tubes.   If your medication is too expensive, please contact our office at [phone] option 4 or send us a message through MyChart.   We are unable to tell what your co-pay for medications will be in advance as this is different depending on your insurance coverage. However, we may be able to find a substitute medication at lower cost or fill out paperwork to get insurance to cover a needed medication.   If a prior authorization is required to get your medication covered by your insurance company, please allow us 1-2 business days to complete this process.  Drug prices often vary depending on where the prescription is filled and some pharmacies may offer cheaper prices.  The website www.goodrx.com contains coupons for medications through different pharmacies. The prices here do not account for what the cost may be with help from insurance (it may be cheaper with your insurance), but the website can   give you the price if you did not use any insurance.  - You can print the associated coupon and take it with your prescription to the pharmacy.  - You may also stop by our office during regular business hours and pick up a GoodRx coupon card.  - If you need your prescription sent electronically to a different pharmacy, notify our office through  MyChart or by phone at [phone] option 4.     Si Usted Necesita Algo Despus de Su Visita  Tambin puede  enviarnos un mensaje a travs de MyChart. Por lo general respondemos a los mensajes de MyChart en el transcurso de 1 a 2 das hbiles.  Para renovar recetas, por favor pida a su farmacia que se ponga en contacto con nuestra oficina. Nuestro nmero de fax es el [phone].  Si tiene un asunto urgente cuando la clnica est cerrada y que no puede esperar hasta el siguiente da hbil, puede llamar/localizar a su doctor(a) al nmero que aparece a continuacin.   Por favor, tenga en cuenta que aunque hacemos todo lo posible para estar disponibles para asuntos urgentes fuera del horario de oficina, no estamos disponibles las 24 horas del da, los 7 das de la semana.   Si tiene un problema urgente y no puede comunicarse con nosotros, puede optar por buscar atencin mdica  en el consultorio de su doctor(a), en una clnica privada, en un centro de atencin urgente o en una sala de emergencias.  Si tiene una emergencia mdica, por favor llame inmediatamente al 911 o vaya a la sala de emergencias.  Nmeros de bper  - Dr. Kowalski: [phone]  - Dra. Moye: [phone]  - Dra. Stewart: [phone]  En caso de inclemencias del tiempo, por favor llame a nuestra lnea principal al [phone] para una actualizacin sobre el estado de cualquier retraso o cierre.  Consejos para la medicacin en dermatologa: Por favor, guarde las cajas en las que vienen los medicamentos de uso tpico para ayudarle a seguir las instrucciones sobre dnde y cmo usarlos. Las farmacias generalmente imprimen las instrucciones del medicamento slo en las cajas y no directamente en los tubos del medicamento.   Si su medicamento es muy caro, por favor, pngase en contacto con nuestra oficina llamando al [phone] y presione la opcin 4 o envenos un mensaje a travs de MyChart.   No podemos decirle cul ser su copago por los medicamentos por adelantado ya que esto es diferente dependiendo de la cobertura de su seguro.  Sin embargo, es posible que podamos encontrar un medicamento sustituto a menor costo o llenar un formulario para que el seguro cubra el medicamento que se considera necesario.   Si se requiere una autorizacin previa para que su compaa de seguros cubra su medicamento, por favor permtanos de 1 a 2 das hbiles para completar este proceso.  Los precios de los medicamentos varan con frecuencia dependiendo del lugar de dnde se surte la receta y alguna farmacias pueden ofrecer precios ms baratos.  El sitio web www.goodrx.com tiene cupones para medicamentos de diferentes farmacias. Los precios aqu no tienen en cuenta lo que podra costar con la ayuda del seguro (puede ser ms barato con su seguro), pero el sitio web puede darle el precio si no utiliz ningn seguro.  - Puede imprimir el cupn correspondiente y llevarlo con su receta a la farmacia.  - Tambin puede pasar por nuestra oficina durante el horario de atencin regular y recoger una tarjeta de cupones de GoodRx.  -   Si necesita que su receta se enve electrnicamente a una farmacia diferente, informe a nuestra oficina a travs de MyChart de [place] o por telfono llamando al [phone] y presione la opcin 4.  

## 2022-10-15 NOTE — Progress Notes (Signed)
Follow-Up Visit   Subjective  Stacey [name] is a 82 y.o. female who presents for the following: TBSE, hx of Merkel Cell L calf, hx of SCC, hx of AKs The patient presents for Total-Body Skin Exam (TBSE) for skin cancer screening and mole check.  The patient has spots, moles and lesions to be evaluated, some may be new or changing and the patient has concerns that these could be cancer.  The following portions of the chart were reviewed this encounter and updated as appropriate: medications, allergies, medical history  Review of Systems:  No other skin or systemic complaints except as noted in HPI or Assessment and Plan.  Objective  Well appearing patient in no apparent distress; mood and affect are within normal limits.  A full examination was performed including scalp, head, eyes, ears, nose, lips, neck, chest, axillae, abdomen, back, buttocks, bilateral upper extremities, bilateral lower extremities, hands, feet, fingers, toes, fingernails, and toenails. All findings within normal limits unless otherwise noted below.   Relevant exam findings are noted in the Assessment and Plan.  L upper lip (one at excision scar) x 2 (2) Pink scaly macules   Assessment & Plan   LENTIGINES, SEBORRHEIC KERATOSES, HEMANGIOMAS - Benign normal skin lesions - Benign-appearing - Call for any changes  MELANOCYTIC NEVI - Tan-brown and/or pink-flesh-colored symmetric macules and papules - Benign appearing on exam today - Observation - Call clinic for new or changing moles - Recommend daily use of broad spectrum spf 30+ sunscreen to sun-exposed areas.   ACTINIC DAMAGE - Chronic condition, secondary to cumulative UV/sun exposure - diffuse scaly erythematous macules with underlying dyspigmentation - Recommend daily broad spectrum sunscreen SPF 30+ to sun-exposed areas, reapply every 2 hours as needed.  - Staying in the shade or wearing long sleeves, sun glasses (UVA+UVB protection) and wide brim  hats (4-inch brim around the entire circumference of the hat) are also recommended for sun protection.  - Call for new or changing lesions.  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 Observe for recurrence. Call clinic for new or changing lesions.  Recommend regular skin exams, daily broad-spectrum spf 30+ sunscreen use, and photoprotection.    Cont f/u with [facility] radiation   Purpura - Chronic; persistent and recurrent.  Treatable, but not curable. - Violaceous macules and patches - Benign - Related to trauma, age, sun damage and/or use of blood thinners, chronic use of topical and/or oral steroids - Observe - Can use OTC arnica containing moisturizer such as Dermend Bruise Formula if desired - Call for worsening or other concerns   HISTORY OF SQUAMOUS CELL CARCINOMA OF THE SKIN - No evidence of recurrence today - No lymphadenopathy - Recommend regular full body skin exams - Recommend daily broad spectrum sunscreen SPF 30+ to sun-exposed areas, reapply every 2 hours  as needed.  - Call if any new or changing lesions are noted between office visits - L upper lat lip above vermillion border  Return in about 6 months (around 04/17/2023) for TBSE.  I, Ardis Rowan, RMA, am acting as scribe for Armida Sans, MD .  Documentation: I have reviewed the above documentation for accuracy and completeness, and I agree with the above.  Armida Sans, MD

## 2022-10-27 ENCOUNTER — Encounter: Payer: Self-pay | Admitting: Dermatology

## 2022-11-21 ENCOUNTER — Other Ambulatory Visit: Payer: Self-pay | Admitting: Family Medicine

## 2022-12-02 ENCOUNTER — Telehealth: Payer: Self-pay | Admitting: Family Medicine

## 2022-12-02 NOTE — Telephone Encounter (Signed)
Patient is seeing Dr. Sullivan Lone at [facility] See 08/19/2022 AWV cancellation reason.

## 2022-12-19 ENCOUNTER — Other Ambulatory Visit: Payer: Self-pay | Admitting: Family Medicine

## 2022-12-21 NOTE — Telephone Encounter (Signed)
Requested medication (s) are due for refill today:   Yes  Requested medication (s) are on the active medication list:   Yes  Future visit scheduled:   No    Last ordered: 4/29/20224 #90, 0 refills by Dr. Roxan Hockey  Returned because labs and an appt are due.   Attempted to call her to make an appt however the call did not go through the first time and 2nd time it sounded like someone picked up but they could not hear me and hung up.    So unable to get her scheduled.    Does not have a MyChart account set up.   Requested Prescriptions  Pending Prescriptions Disp Refills   buPROPion (WELLBUTRIN XL) 300 MG 24 hr tablet [Pharmacy Med Name: BUPROPION HCL ER (XL) 300 MG TAB] 90 tablet 0    Sig: TAKE 1 TABLET BY MOUTH DAILY     Psychiatry: Antidepressants - bupropion Failed - 12/19/2022  8:03 AM      Failed - Cr in normal range and within 360 days    Creat  Date Value Ref Range Status  04/29/2017 0.93 0.60 - 0.93 mg/dL Final    Comment:    For patients >45 years of age, the reference limit for Creatinine is approximately 13% higher for people identified as African-American. .    Creatinine, Ser  Date Value Ref Range Status  05/22/2022 1.06 (H) 0.57 - 1.00 mg/dL Final         Failed - Completed PHQ-2 or PHQ-9 in the last 360 days      Failed - Valid encounter within last 6 months    Recent Outpatient Visits           6 months ago Bilateral leg edema   [place] [facility] Simmons-Robinson, [place], MD   7 months ago Cellulitis of right lower extremity   [place] [facility] [place], [place], MD   7 months ago Bilateral leg edema   [place] [facility] Merita Norton T, FNP   1 year ago Cellulitis of right lower extremity   [place] [facility] Bosie Clos, MD   1 year ago Acute cystitis with hematuria   [place] [facility] Mecum, Oswaldo Conroy, PA-C       Future Appointments              In 3 months Deirdre Evener, MD [place] [facility]            Passed - AST in normal range and within 360 days    AST  Date Value Ref Range Status  05/22/2022 17 0 - 40 IU/L Final         Passed - ALT in normal range and within 360 days    ALT  Date Value Ref Range Status  05/22/2022 11 0 - 32 IU/L Final         Passed - Last BP in normal range    BP Readings from Last 1 Encounters:  10/15/22 134/74

## 2023-01-02 ENCOUNTER — Other Ambulatory Visit: Payer: Self-pay | Admitting: Family Medicine

## 2023-01-04 NOTE — Telephone Encounter (Signed)
Requested Prescriptions  Refused Prescriptions Disp Refills   buPROPion (WELLBUTRIN XL) 300 MG 24 hr tablet [Pharmacy Med Name: BUPROPION HCL ER (XL) 300 MG TAB] 90 tablet 0    Sig: TAKE 1 TABLET BY MOUTH DAILY     Psychiatry: Antidepressants - bupropion Failed - 01/02/2023 10:25 AM      Failed - Cr in normal range and within 360 days    Creat  Date Value Ref Range Status  04/29/2017 0.93 0.60 - 0.93 mg/dL Final    Comment:    For patients >80 years of age, the reference limit for Creatinine is approximately 13% higher for people identified as African-American. .    Creatinine, Ser  Date Value Ref Range Status  05/22/2022 1.06 (H) 0.57 - 1.00 mg/dL Final         Failed - Completed PHQ-2 or PHQ-9 in the last 360 days      Failed - Valid encounter within last 6 months    Recent Outpatient Visits           7 months ago Bilateral leg edema   [facility] Simmons-Robinson, [place], MD   7 months ago Cellulitis of right lower extremity   [place] [facility] [place], [place], MD   7 months ago Bilateral leg edema   [place] [facility] Merita Norton T, FNP   1 year ago Cellulitis of right lower extremity   [place] [facility] Bosie Clos, MD   1 year ago Acute cystitis with hematuria   [place] [facility] Mecum, Oswaldo Conroy, PA-C       Future Appointments             In 3 months Deirdre Evener, MD  [facility]            Passed - AST in normal range and within 360 days    AST  Date Value Ref Range Status  05/22/2022 17 0 - 40 IU/L Final         Passed - ALT in normal range and within 360 days    ALT  Date Value Ref Range Status  05/22/2022 11 0 - 32 IU/L Final         Passed - Last BP in normal range    BP Readings from Last 1 Encounters:  10/15/22 134/74

## 2023-02-03 ENCOUNTER — Ambulatory Visit: Payer: Medicare HMO | Admitting: Dermatology

## 2023-02-03 VITALS — BP 135/69

## 2023-02-03 DIAGNOSIS — L820 Inflamed seborrheic keratosis: Secondary | ICD-10-CM | POA: Diagnosis not present

## 2023-02-03 DIAGNOSIS — L814 Other melanin hyperpigmentation: Secondary | ICD-10-CM

## 2023-02-03 DIAGNOSIS — Z85828 Personal history of other malignant neoplasm of skin: Secondary | ICD-10-CM

## 2023-02-03 DIAGNOSIS — Z1283 Encounter for screening for malignant neoplasm of skin: Secondary | ICD-10-CM | POA: Diagnosis not present

## 2023-02-03 DIAGNOSIS — D692 Other nonthrombocytopenic purpura: Secondary | ICD-10-CM

## 2023-02-03 DIAGNOSIS — W908XXA Exposure to other nonionizing radiation, initial encounter: Secondary | ICD-10-CM

## 2023-02-03 DIAGNOSIS — Z85821 Personal history of Merkel cell carcinoma: Secondary | ICD-10-CM

## 2023-02-03 DIAGNOSIS — L578 Other skin changes due to chronic exposure to nonionizing radiation: Secondary | ICD-10-CM

## 2023-02-03 DIAGNOSIS — Z872 Personal history of diseases of the skin and subcutaneous tissue: Secondary | ICD-10-CM

## 2023-02-03 DIAGNOSIS — Z7189 Other specified counseling: Secondary | ICD-10-CM

## 2023-02-03 DIAGNOSIS — D1801 Hemangioma of skin and subcutaneous tissue: Secondary | ICD-10-CM

## 2023-02-03 DIAGNOSIS — Z8589 Personal history of malignant neoplasm of other organs and systems: Secondary | ICD-10-CM

## 2023-02-03 DIAGNOSIS — L821 Other seborrheic keratosis: Secondary | ICD-10-CM

## 2023-02-03 NOTE — Progress Notes (Signed)
Follow-Up Visit   Subjective  Stacey [name] is a 82 y.o. female who presents for the following: Skin Cancer Screening and Full Body Skin Exam, hx of Merkel Cell, SCC, AKs  The patient presents for Total-Body Skin Exam (TBSE) for skin cancer screening and mole check. The patient has spots, moles and lesions to be evaluated, some may be new or changing and the patient may have concern these could be cancer.    The following portions of the chart were reviewed this encounter and updated as appropriate: medications, allergies, medical history  Review of Systems:  No other skin or systemic complaints except as noted in HPI or Assessment and Plan.  Objective  Well appearing patient in no apparent distress; mood and affect are within normal limits.  A full examination was performed including scalp, head, eyes, ears, nose, lips, neck, chest, axillae, abdomen, back, buttocks, bilateral upper extremities, bilateral lower extremities, hands, feet, fingers, toes, fingernails, and toenails. All findings within normal limits unless otherwise noted below.   Relevant physical exam findings are noted in the Assessment and Plan.  L dorsum foot x 1 Stuck on waxy paps with erythema    Assessment & Plan   SKIN CANCER SCREENING PERFORMED TODAY.  ACTINIC DAMAGE - Chronic condition, secondary to cumulative UV/sun exposure - diffuse scaly erythematous macules with underlying dyspigmentation - Recommend daily broad spectrum sunscreen SPF 30+ to sun-exposed areas, reapply every 2 hours as needed.  - Staying in the shade or wearing long sleeves, sun glasses (UVA+UVB protection) and wide brim hats (4-inch brim around the entire circumference of the hat) are also recommended for sun protection.  - Call for new or changing lesions.  LENTIGINES, SEBORRHEIC KERATOSES, HEMANGIOMAS - Benign normal skin lesions - Benign-appearing - Call for any changes  MELANOCYTIC NEVI - Tan-brown and/or  pink-flesh-colored symmetric macules and papules - Benign appearing on exam today - Observation - Call clinic for new or changing moles - Recommend daily use of broad spectrum spf 30+ sunscreen to sun-exposed areas.   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 Exam: excision site clear to exam and palpation, no lymphadenopathy, leg with radiation changes Treatment Plan: Clear. Observe for recurrence. Call clinic for new or changing lesions.  Recommend regular skin exams, daily broad-spectrum spf 30+ sunscreen use, and photoprotection.    Finished radiation at [employer] f/u with [facility]  HISTORY OF SQUAMOUS CELL CARCINOMA OF THE SKIN - No evidence of recurrence today - No lymphadenopathy - Recommend regular full body skin exams - Recommend daily broad spectrum sunscreen SPF 30+ to sun-exposed areas, reapply every 2 hours as needed.  - Call if any new or changing lesions are noted between office visits -L upper lat lip above vermillion border mohs 10/02/19   Purpura - Chronic; persistent and recurrent.  Treatable, but not curable. - Violaceous macules and patches - Benign - Related to trauma, age, sun damage and/or use of blood thinners, chronic use of topical and/or oral steroids - Observe - Can use OTC arnica containing moisturizer such as Dermend Bruise Formula if desired - Call for worsening or other concerns - arms, R flank  Inflamed seborrheic keratosis L dorsum foot x 1  Symptomatic, irritating, patient would like treated. Recheck on f/u  Destruction of lesion - L dorsum foot x 1 Complexity: simple   Destruction method: cryotherapy   Informed consent: discussed  and consent obtained   Timeout:  patient name, date of birth, surgical site, and procedure verified Lesion destroyed using liquid nitrogen: Yes   Region frozen until ice ball extended  beyond lesion: Yes   Outcome: patient tolerated procedure well with no complications   Post-procedure details: wound care instructions given    Skin cancer screening  Actinic skin damage  History of squamous cell carcinoma  Purpura (HCC)  History of Merkel cell carcinoma   HISTORY OF PRECANCEROUS ACTINIC KERATOSIS - site(s) of PreCancerous Actinic Keratosis clear today. - these may recur and new lesions may form requiring treatment to prevent transformation into skin cancer - observe for new or changing spots and contact [facility] for appointment if occur - photoprotection with sun protective clothing; sunglasses and broad spectrum sunscreen with SPF of at least 30 + and frequent self skin exams recommended - yearly exams by a dermatologist recommended for persons with history of PreCancerous Actinic Keratoses   Return in about 6 months (around 08/03/2023) for hx of Merkle cell, Hx of SCC, Hx of AKs, recheck ISK L dorsum foot.  I, Ardis Rowan, RMA, am acting as scribe for Armida Sans, MD .   Documentation: I have reviewed the above documentation for accuracy and completeness, and I agree with the above.  Armida Sans, MD

## 2023-02-03 NOTE — Patient Instructions (Addendum)

## 2023-02-05 ENCOUNTER — Encounter: Payer: Self-pay | Admitting: Dermatology

## 2023-03-02 ENCOUNTER — Ambulatory Visit
Admission: RE | Admit: 2023-03-02 | Discharge: 2023-03-02 | Disposition: A | Discharge status: Home / Self Care | Payer: Medicare HMO | Attending: Gastroenterology | Admitting: Gastroenterology

## 2023-03-02 ENCOUNTER — Encounter
Admission: RE | Disposition: A | Discharge status: Home / Self Care | Payer: Self-pay | Source: Home / Self Care | Attending: Gastroenterology

## 2023-03-02 ENCOUNTER — Ambulatory Visit: Payer: Medicare HMO | Admitting: Anesthesiology

## 2023-03-02 ENCOUNTER — Encounter: Payer: Self-pay | Admitting: *Deleted

## 2023-03-02 DIAGNOSIS — K641 Second degree hemorrhoids: Secondary | ICD-10-CM | POA: Diagnosis not present

## 2023-03-02 DIAGNOSIS — R933 Abnormal findings on diagnostic imaging of other parts of digestive tract: Secondary | ICD-10-CM | POA: Diagnosis present

## 2023-03-02 DIAGNOSIS — K5730 Diverticulosis of large intestine without perforation or abscess without bleeding: Secondary | ICD-10-CM | POA: Insufficient documentation

## 2023-03-02 DIAGNOSIS — G4733 Obstructive sleep apnea (adult) (pediatric): Secondary | ICD-10-CM | POA: Diagnosis not present

## 2023-03-02 DIAGNOSIS — Z8673 Personal history of transient ischemic attack (TIA), and cerebral infarction without residual deficits: Secondary | ICD-10-CM | POA: Insufficient documentation

## 2023-03-02 DIAGNOSIS — Z87891 Personal history of nicotine dependence: Secondary | ICD-10-CM | POA: Diagnosis not present

## 2023-03-02 DIAGNOSIS — N183 Chronic kidney disease, stage 3 unspecified: Secondary | ICD-10-CM | POA: Insufficient documentation

## 2023-03-02 DIAGNOSIS — F32A Depression, unspecified: Secondary | ICD-10-CM | POA: Insufficient documentation

## 2023-03-02 DIAGNOSIS — D122 Benign neoplasm of ascending colon: Secondary | ICD-10-CM | POA: Insufficient documentation

## 2023-03-02 DIAGNOSIS — I2510 Atherosclerotic heart disease of native coronary artery without angina pectoris: Secondary | ICD-10-CM | POA: Insufficient documentation

## 2023-03-02 DIAGNOSIS — I252 Old myocardial infarction: Secondary | ICD-10-CM | POA: Insufficient documentation

## 2023-03-02 DIAGNOSIS — I129 Hypertensive chronic kidney disease with stage 1 through stage 4 chronic kidney disease, or unspecified chronic kidney disease: Secondary | ICD-10-CM | POA: Diagnosis not present

## 2023-03-02 DIAGNOSIS — E039 Hypothyroidism, unspecified: Secondary | ICD-10-CM | POA: Diagnosis not present

## 2023-03-02 DIAGNOSIS — K6289 Other specified diseases of anus and rectum: Secondary | ICD-10-CM | POA: Diagnosis not present

## 2023-03-02 DIAGNOSIS — Z951 Presence of aortocoronary bypass graft: Secondary | ICD-10-CM | POA: Insufficient documentation

## 2023-03-02 DIAGNOSIS — K219 Gastro-esophageal reflux disease without esophagitis: Secondary | ICD-10-CM | POA: Insufficient documentation

## 2023-03-02 HISTORY — PX: COLONOSCOPY WITH PROPOFOL: SHX5780

## 2023-03-02 HISTORY — PX: POLYPECTOMY: SHX5525

## 2023-03-02 SURGERY — COLONOSCOPY WITH PROPOFOL
Anesthesia: General

## 2023-03-02 MED ORDER — LIDOCAINE HCL (CARDIAC) PF 100 MG/5ML IV SOSY
PREFILLED_SYRINGE | INTRAVENOUS | Status: DC | PRN
  Administered 2023-03-02: 20 mg via INTRAVENOUS

## 2023-03-02 MED ORDER — PROPOFOL 10 MG/ML IV BOLUS
INTRAVENOUS | Status: DC | PRN
  Administered 2023-03-02: 20 mg via INTRAVENOUS
  Administered 2023-03-02: 80 mg via INTRAVENOUS

## 2023-03-02 MED ORDER — PROPOFOL 500 MG/50ML IV EMUL
INTRAVENOUS | Status: DC | PRN
  Administered 2023-03-02: 125 ug/kg/min via INTRAVENOUS

## 2023-03-02 MED ORDER — SODIUM CHLORIDE 0.9 % IV SOLN
INTRAVENOUS | Status: DC | PRN
Start: 2023-03-02 — End: 2023-03-02

## 2023-03-02 MED ORDER — SODIUM CHLORIDE 0.9 % IV SOLN: INTRAVENOUS | Status: DC

## 2023-03-02 NOTE — H&P (Signed)
Outpatient short stay form Pre-procedure 03/02/2023  Regis Bill, MD  Primary Physician: Bosie Clos, MD  Reason for visit:  Abnormal imaging  History of present illness:    82 y/o lady with history of hypertension, hypothyroidism, and merkel cell cancer here for colonoscopy due to abnormal PET scan which showed increased uptake in the rectum. Last dose of eliquis was three days ago. History of partial hysterectomy and appendectomy.   No current facility-administered medications for this encounter.  Medications Prior to Admission  Medication Sig Dispense Refill Last Dose   amLODipine (NORVASC) 5 MG tablet Take 1 tablet by mouth daily.   03/02/2023   aspirin 81 MG tablet Take 1 tablet (81 mg total) by mouth daily. 30 tablet 2 Past Week   furosemide (LASIX) 40 MG tablet TAKE ONE TABLET EVERY OTHER DAY 30 tablet 3 03/01/2023   isosorbide mononitrate (IMDUR) 30 MG 24 hr tablet Take 30 mg by mouth daily.   03/01/2023   levothyroxine (SYNTHROID) 100 MCG tablet Take 1 tablet (100 mcg total) by mouth daily. 90 tablet 1 03/01/2023   zolpidem (AMBIEN) 5 MG tablet Take 1 tablet (5 mg total) by mouth at bedtime as needed. for sleep 30 tablet 0 Past Week   acyclovir (ZOVIRAX) 400 MG tablet Take 400 mg by mouth 3 times per day for 5 days to start immediately with each episode of fever blister. May also take one a day for prevention. 30 tablet 11    albuterol (VENTOLIN HFA) 108 (90 Base) MCG/ACT inhaler INHALE 2 PUFFS BY MOUTH EVERY 4 TO 6 HOURS AS NEEDED FOR SHORTNESS OF BREATH 18 Inhaler 5    apixaban (ELIQUIS) 5 MG TABS tablet Take 1 tablet (5 mg total) by mouth 2 (two) times daily. 60 tablet 2 02/26/2023   ascorbic acid (VITAMIN C) 500 MG tablet Take 2,000 mg by mouth daily.       buPROPion (WELLBUTRIN XL) 300 MG 24 hr tablet TAKE 1 TABLET BY MOUTH DAILY 90 tablet 0    chlorthalidone (HYGROTON) 25 MG tablet Take 0.5 tablets by mouth daily.      Cholecalciferol (VITAMIN D) 2000 units tablet  Take 2,000 Units by mouth daily.      CRANBERRY PO Take 1 tablet by mouth daily.      Cyanocobalamin (B-12) 500 MCG TABS Take 500 mcg by mouth daily.       dextromethorphan-guaiFENesin (MUCINEX DM) 30-600 MG 12hr tablet Take 1 tablet by mouth 2 (two) times daily as needed for cough. (Patient not taking: Reported on 06/05/2022)      diazepam (VALIUM) 5 MG tablet Take 1 tablet (5 mg total) by mouth every 12 (twelve) hours as needed for anxiety. 20 tablet 0    ELDERBERRY PO Take by mouth daily. Unsure dose / combination vitamin      EPINEPHrine (EPIPEN 2-PAK) 0.3 mg/0.3 mL IJ SOAJ injection INJECT AS DIRECTED FOR SEVERE ALLERGIC REACTION (Patient taking differently: Inject 0.3 mg into the muscle as needed (for severe allergic reaction).) 1 Device 1    fluocinonide gel (LIDEX) 0.05 % Apply 1 application topically as needed.      menthol-cetylpyridinium (CEPACOL) 3 MG lozenge Take 1 lozenge (3 mg total) by mouth as needed for sore throat.      methocarbamol (ROBAXIN) 500 MG tablet Take 1 tablet (500 mg total) by mouth every 8 (eight) hours as needed for muscle spasms. 20 tablet 0    metoprolol tartrate (LOPRESSOR) 25 MG tablet One and one half  tabs twice daily (Patient taking differently: Take 25-50 mg by mouth 2 (two) times daily. 50 mg qam and 25 mg qpm) 90 tablet 0    mometasone (ELOCON) 0.1 % cream Apply 1 application topically daily as needed (Rash). 45 g 2    Multiple Vitamin (MULTI-VITAMINS) TABS Take 1 tablet by mouth daily.      mupirocin ointment (BACTROBAN) 2 % Apply 1 Application topically daily. With dressing changes 22 g 0    oxymetazoline (AFRIN) 0.05 % nasal spray Place 1 spray into both nostrils as needed for congestion.      Pyridoxine HCl (VITAMIN B-6 PO) Take 1 tablet by mouth daily.      rosuvastatin (CRESTOR) 20 MG tablet TAKE ONE TABLET (20 MG) BY MOUTH EVERY EVENING 90 tablet 3    sertraline (ZOLOFT) 25 MG tablet TAKE 1 TABLET BY MOUTH DAILY. SCHEDULE APPT FOR MORE REFILLS 90  tablet 1    sulfamethoxazole-trimethoprim (BACTRIM DS) 800-160 MG tablet Take 1 tablet by mouth 2 (two) times daily. (Patient not taking: Reported on 06/05/2022) 20 tablet 0    tacrolimus (PROTOPIC) 0.1 % ointment As needed for rosacea flare ups      tacrolimus (PROTOPIC) 0.1 % ointment Apply qd to bid qd prn to lips for dryness 60 g 1    zinc gluconate 50 MG tablet Take 50 mg by mouth daily.         Allergies  Allergen Reactions   Cephalexin Anaphylaxis   Atorvastatin     myalgia   Morphine And Codeine Hives and Nausea And Vomiting   Other Hives and Other (See Comments)    Muscle pain   Procaine Other (See Comments)    Chest pain Chest pain   Shellfish Allergy Swelling    angioedema   Statins Other (See Comments)    Muscle pain   Tomato Other (See Comments)    (by testing) - also beans, wheat, peas (by testing) - also beans, wheat, peas   Penicillins Hives and Rash    Has patient had a PCN reaction causing immediate rash, facial/tongue/throat swelling, SOB or lightheadedness with hypotension: Yes Has patient had a PCN reaction causing severe rash involving mucus membranes or skin necrosis: No Has patient had a PCN reaction that required hospitalization: No Has patient had a PCN reaction occurring within the last 10 years: Yes If all of the above answers are "NO", then may proceed with Cephalosporin use.     Past Medical History:  Diagnosis Date   Abnormal nuclear stress test 05/24/2017   Actinic keratosis    Anxiety    a.) on BZO (diazepam) PRN   Aortic atherosclerosis (HCC)    Arthritis    Asthma    Atrial fibrillation, transient (HCC)    a.) CHA2DS2VASc = 7 (age x 2, sex, HTN, CVA x 2, prior MI);  b.) rate/rhythm maintained on oral metoprolol tartrate; chronically anticoagulated with apixaban   Benign neoplasm of ascending colon    Benign neoplasm of descending colon    CAD (coronary artery disease)    a.) LHC at [facility] 02/20/2017: 80% oRI-RI, 90% o-pLAD, 95% mLAD,  99% p-mRCA --> urgent CVTS consult; b.) 3v CABG at [facility]; c.) LHC 05/24/2017: 100% o-pLAD, 80% oRI-RI, 100% p-mRCA, SVG-RCA patent, SVG-oD1 with min luminal irregs, LIMA-LAD patent -- med mgmt.   Chronic lower back pain    CKD (chronic kidney disease) stage 3, GFR 30-59 ml/min (HCC)    Complication of anesthesia    DDD (degenerative disc  disease), cervical    Depression    Dyspnea    Family history of adverse reaction to anesthesia    most of family - PONV   Fibromyalgia    GERD (gastroesophageal reflux disease)    Glaucoma    History of 2019 novel coronavirus disease (COVID-19) 02/02/2019   History of bilateral cataract extraction    HTN (hypertension) 03/09/2007   Hyperlipidemia LDL goal <70 03/25/1993   Hypertension    Hypothyroidism    Insomnia    a.) on hypnotic (zolpidem) PRN   Long term current use of anticoagulant    a.) apixaban   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   L calf, WLE at [facility]   Migraines    a.) 1-2x/mo   Motion sickness    all moving vehicles   NSTEMI (non-ST elevated myocardial infarction) (HCC) 02/20/2017   a.) LHC at [facility] 02/20/2017: 80% oRI-RI, 90% o-pLAD, 95% mLAD, 99% p-mRCA --> urgent CVTS consult; b.) 3v CABG at [facility]   OSA on CPAP    PONV (postoperative nausea and vomiting)    S/P CABG x 3    a.) LIMA-LAD, SVG-dRCA, SVG-D1   Squamous cell carcinoma of skin 07/25/2019   Left upper lateral lip above vermillion border. WD SCC with superficial infiltration., MOHs 10/02/19   Stroke (HCC) 10/22/2017   a.) CT/MRI 10/22/2017 - RIGHT occipitoparietal infarct   Vitamin D deficiency     Review of systems:  Otherwise negative.    Physical Exam  Gen: Alert, oriented. Appears stated age.  HEENT: PERRLA. Lungs: No respiratory distress CV: RRR Abd: soft, benign, no masses Ext: No edema    Planned procedures: Proceed with colonoscopy. The patient understands the nature of the planned procedure, indications, risks, alternatives and potential complications  including but not limited to bleeding, infection, perforation, damage to internal organs and possible oversedation/side effects from anesthesia. The patient agrees and gives consent to proceed.  Please refer to procedure notes for findings, recommendations and patient disposition/instructions.     Regis Bill, MD [facility]

## 2023-03-02 NOTE — Anesthesia Postprocedure Evaluation (Signed)
Anesthesia Post Note  Patient: Stacey [name]  Procedure(s) Performed: COLONOSCOPY WITH PROPOFOL POLYPECTOMY  Patient location during evaluation: PACU Anesthesia Type: General Level of consciousness: awake Pain management: satisfactory to patient Vital Signs Assessment: post-procedure vital signs reviewed and stable Respiratory status: nonlabored ventilation and spontaneous breathing Cardiovascular status: blood pressure returned to baseline Anesthetic complications: no   No notable events documented.   Last Vitals:  Vitals:   03/02/23 1225 03/02/23 1315  BP: (!) 145/74 (!) 90/49  Pulse: 76 93  Resp: 20   Temp: 36.5 C   SpO2: 94% 97%    Last Pain:  Vitals:   03/02/23 1315  TempSrc:   PainSc: Asleep                 VAN STAVEREN,Janye Maynor

## 2023-03-02 NOTE — Interval H&P Note (Signed)
History and Physical Interval Note:  03/02/2023 12:49 PM  Stacey [name]  has presented today for surgery, with the diagnosis of R94.8  - Abnormal positron emission tomography (PET) of colon Z86.0109 - Personal history of other colon polyps.  The various methods of treatment have been discussed with the patient and family. After consideration of risks, benefits and other options for treatment, the patient has consented to  Procedure(s) with comments: COLONOSCOPY WITH PROPOFOL (N/A) - "PROBABLY CAN NOT BE EARLIER THAN 11:45 AM ARRIVAL" PER HUSBAND DUE TO TRANSPORTATION as a surgical intervention.  The patient's history has been reviewed, patient examined, no change in status, stable for surgery.  I have reviewed the patient's chart and labs.  Questions were answered to the patient's satisfaction.     Regis Bill  Ok to proceed with colonoscopy

## 2023-03-02 NOTE — Anesthesia Preprocedure Evaluation (Addendum)
Anesthesia Evaluation  Patient identified by MRN, date of birth, ID band Patient awake    Reviewed: Allergy & Precautions, NPO status , Patient's Chart, lab work & pertinent test results  Airway Mallampati: IV  TM Distance: <3 FB Neck ROM: Limited  Mouth opening: Limited Mouth Opening  Dental  (+) Implants   Pulmonary neg pulmonary ROS, shortness of breath, asthma , sleep apnea , Patient abstained from smoking., former smoker   Pulmonary exam normal  + decreased breath sounds      Cardiovascular Exercise Tolerance: Poor hypertension, Pt. on medications + CAD, + Past MI, + CABG and + DOE  negative cardio ROS Normal cardiovascular exam+ dysrhythmias Atrial Fibrillation  Rhythm:Regular     Neuro/Psych    Depression    CVA, Residual Symptoms negative neurological ROS  negative psych ROS   GI/Hepatic negative GI ROS, Neg liver ROS,GERD  Medicated,,  Endo/Other  negative endocrine ROSHypothyroidism    Renal/GU negative Renal ROS  negative genitourinary   Musculoskeletal   Abdominal  (+) + obese  Peds negative pediatric ROS (+)  Hematology negative hematology ROS (+)   Anesthesia Other Findings Past Medical History: 05/24/2017: Abnormal nuclear stress test No date: Actinic keratosis No date: Anxiety     Comment:  a.) on BZO (diazepam) PRN No date: Aortic atherosclerosis (HCC) No date: Arthritis No date: Asthma No date: Atrial fibrillation, transient (HCC)     Comment:  a.) CHA2DS2VASc = 7 (age x 2, sex, HTN, CVA x 2, prior               MI);  b.) rate/rhythm maintained on oral metoprolol               tartrate; chronically anticoagulated with apixaban No date: Benign neoplasm of ascending colon No date: Benign neoplasm of descending colon No date: CAD (coronary artery disease)     Comment:  a.) LHC at [facility] 02/20/2017: 80% oRI-RI, 90% o-pLAD, 95%               mLAD, 99% p-mRCA --> urgent CVTS consult; b.) 3v CABG at                [facility]; c.) LHC 05/24/2017: 100% o-pLAD, 80% oRI-RI, 100%               p-mRCA, SVG-RCA patent, SVG-oD1 with min luminal irregs,               LIMA-LAD patent -- med mgmt. No date: Chronic lower back pain No date: CKD (chronic kidney disease) stage 3, GFR 30-59 ml/min (HCC) No date: Complication of anesthesia No date: DDD (degenerative disc disease), cervical No date: Depression No date: Dyspnea No date: Family history of adverse reaction to anesthesia     Comment:  most of family - PONV No date: Fibromyalgia No date: GERD (gastroesophageal reflux disease) No date: Glaucoma 02/02/2019: History of 2019 novel coronavirus disease (COVID-19) No date: History of bilateral cataract extraction 03/09/2007: HTN (hypertension) 03/25/1993: Hyperlipidemia LDL goal <70 No date: Hypertension No date: Hypothyroidism No date: Insomnia     Comment:  a.) on hypnotic (zolpidem) PRN No date: Long term current use of anticoagulant     Comment:  a.) apixaban 07/23/2022: Merkel cell cancer (HCC)     Comment:  L calf, WLE at [facility] No date: Migraines     Comment:  a.) 1-2x/mo No date: Motion sickness     Comment:  all moving vehicles 02/20/2017: NSTEMI (non-ST elevated myocardial infarction) (HCC)  Comment:  a.) LHC at [facility] 02/20/2017: 80% oRI-RI, 90% o-pLAD, 95%               mLAD, 99% p-mRCA --> urgent CVTS consult; b.) 3v CABG at               [facility] No date: OSA on CPAP No date: PONV (postoperative nausea and vomiting) No date: S/P CABG x 3     Comment:  a.) LIMA-LAD, SVG-dRCA, SVG-D1 07/25/2019: Squamous cell carcinoma of skin     Comment:  Left upper lateral lip above vermillion border. WD SCC               with superficial infiltration., MOHs 10/02/19 10/22/2017: Stroke (HCC)     Comment:  a.) CT/MRI 10/22/2017 - RIGHT occipitoparietal infarct No date: Vitamin D deficiency  Past Surgical History: No date: ABDOMINAL HYSTERECTOMY No date: APPENDECTOMY No date: BLADDER  SURGERY No date: CATARACT EXTRACTION W/ INTRAOCULAR LENS  IMPLANT, BILATERAL 01/17/2016: COLONOSCOPY WITH PROPOFOL; N/A     Comment:  Procedure: COLONOSCOPY WITH PROPOFOL;  Surgeon: Midge Minium, MD;  Location: [facility];  Service:               Endoscopy;  Laterality: N/A; 02/20/2017: CORONARY ARTERY BYPASS GRAFT; N/A     Comment:  Procedure: CORONARY ARTERY BYPASS GRAFTING (CABG) x ,               three using left internal mammary artery to left anterior              descending coronary artery and right greater saphenous               vein harvested endoscopically to distal right and               diagonal coronary arteries.;  Surgeon: Delight Ovens, MD;  Location: [facility] OR;  Service: Open Heart Surgery;                Laterality: N/A; 02/20/2017: LEFT HEART CATH AND CORONARY ANGIOGRAPHY; N/A     Comment:  Procedure: LEFT HEART CATH AND CORONARY ANGIOGRAPHY;                Surgeon: Marcina Millard, MD;  Location: [facility]               INVASIVE CV LAB;  Service: Cardiovascular;  Laterality:               N/A; 05/24/2017: LEFT HEART CATH AND CORS/GRAFTS ANGIOGRAPHY; N/A     Comment:  Procedure: LEFT HEART CATH AND CORS/GRAFTS ANGIOGRAPHY;               Surgeon: Tonny Bollman, MD;  Location: [facility] INVASIVE CV               LAB;  Service: Cardiovascular;  Laterality: N/A; No date: NASAL SINUS SURGERY 01/17/2016: POLYPECTOMY     Comment:  Procedure: POLYPECTOMY;  Surgeon: Midge Minium, MD;                Location: [facility];  Service: Endoscopy;; No date: RIGHT OOPHORECTOMY 02/20/2017: TEE WITHOUT CARDIOVERSION; N/A     Comment:  Procedure: TRANSESOPHAGEAL ECHOCARDIOGRAM (TEE);                Surgeon: Delight Ovens, MD;  Location: [facility] OR;                Service: Open Heart Surgery;  Laterality: N/A; 03/26/2022: TOTAL SHOULDER ARTHROPLASTY; Right     Comment:  Procedure: TOTAL SHOULDER ARTHROPLASTY- reverse;                Surgeon:  Ross Marcus, MD;  Location: [facility] ORS;                Service: Orthopedics;  Laterality: Right;     Reproductive/Obstetrics negative OB ROS                             Anesthesia Physical Anesthesia Plan  ASA: 4  Anesthesia Plan: General   Post-op Pain Management:    Induction: Intravenous  PONV Risk Score and Plan: Propofol infusion and TIVA  Airway Management Planned: Natural Airway and Nasal Cannula  Additional Equipment:   Intra-op Plan:   Post-operative Plan:   Informed Consent: I have reviewed the patients History and Physical, chart, labs and discussed the procedure including the risks, benefits and alternatives for the proposed anesthesia with the patient or authorized representative who has indicated his/her understanding and acceptance.     Dental Advisory Given  Plan Discussed with: Anesthesiologist, CRNA and Surgeon  Anesthesia Plan Comments:        Anesthesia Quick Evaluation

## 2023-03-02 NOTE — Op Note (Signed)
[facility]astroenterology Patient Name: Stacey [name] Procedure Date: 03/02/2023 12:48 PM MRN: [id number] Account #: [id number] Date of Birth: 1941-03-26 Admit Type: Outpatient Age: 82 Room: [facility] ENDO ROOM 1 Gender: Female Note Status: Supervisor Override Instrument Name: Prentice Docker [id number] Procedure:             Colonoscopy Indications:           Personal history of colonic polyps, Abnormal PET scan                         of the GI tract Providers:             Eather Colas MD, MD Referring MD:          Ferdinand Lango. Sullivan Lone, MD (Referring MD) Medicines:             Monitored Anesthesia Care Complications:         No immediate complications. Estimated blood loss:                         Minimal. Procedure:             Pre-Anesthesia Assessment:                        - Prior to the procedure, a History and Physical was                         performed, and patient medications and allergies were                         reviewed. The patient is competent. The risks and                         benefits of the procedure and the sedation options and                         risks were discussed with the patient. All questions                         were answered and informed consent was obtained.                         Patient identification and proposed procedure were                         verified by the physician, the nurse, the                         anesthesiologist, the anesthetist and the technician                         in the endoscopy suite. Mental Status Examination:                         alert and oriented. Airway Examination: normal                         oropharyngeal airway and neck mobility. Respiratory  Examination: clear to auscultation. CV Examination:                         normal. Prophylactic Antibiotics: The patient does not                         require prophylactic antibiotics. Prior                          Anticoagulants: The patient has taken no anticoagulant                         or antiplatelet agents. ASA Grade Assessment: IV - A                         patient with severe systemic disease that is a                         constant threat to life. After reviewing the risks and                         benefits, the patient was deemed in satisfactory                         condition to undergo the procedure. The anesthesia                         plan was to use monitored anesthesia care (MAC).                         Immediately prior to administration of medications,                         the patient was re-assessed for adequacy to receive                         sedatives. The heart rate, respiratory rate, oxygen                         saturations, blood pressure, adequacy of pulmonary                         ventilation, and response to care were monitored                         throughout the procedure. The physical status of the                         patient was re-assessed after the procedure.                        After obtaining informed consent, the colonoscope was                         passed under direct vision. Throughout the procedure,                         the patient's blood pressure, pulse, and oxygen  saturations were monitored continuously. The                         Colonoscope was introduced through the anus and                         advanced to the the cecum, identified by appendiceal                         orifice and ileocecal valve. The colonoscopy was                         performed without difficulty. The patient tolerated                         the procedure well. The quality of the bowel                         preparation was adequate to identify polyps. The                         ileocecal valve, appendiceal orifice, and rectum were                         photographed. Findings:      The perianal and  digital rectal examinations were normal.      A 3 mm polyp was found in the ascending colon. The polyp was sessile.       The polyp was removed with a cold snare. Resection and retrieval were       complete. Estimated blood loss was minimal.      Scattered large-mouthed and small-mouthed diverticula were found in the       sigmoid colon, descending colon, transverse colon and ascending colon.      Internal hemorrhoids were found during retroflexion. The hemorrhoids       were Grade II (internal hemorrhoids that prolapse but reduce       spontaneously).      Anal papilla(e) were hypertrophied. Impression:            - One 3 mm polyp in the ascending colon, removed with                         a cold snare. Resected and retrieved.                        - Diverticulosis in the sigmoid colon, in the                         descending colon, in the transverse colon and in the                         ascending colon.                        - Internal hemorrhoids.                        - Anal papilla(e) were hypertrophied. Recommendation:        - Discharge patient to home.                        -  Resume previous diet.                        - Continue present medications.                        - Await pathology results.                        - Repeat colonoscopy is not recommended due to current                         age (64 years or older) for surveillance.                        - Return to referring physician as previously                         scheduled. Procedure Code(s):     --- Professional ---                        [phone], Colonoscopy, flexible; with removal of                         tumor(s), polyp(s), or other lesion(s) by snare                         technique Diagnosis Code(s):     --- Professional ---                        D12.2, Benign neoplasm of ascending colon                        K64.1, Second degree hemorrhoids                        K62.89, Other specified  diseases of anus and rectum                        K57.30, Diverticulosis of large intestine without                         perforation or abscess without bleeding                        R93.3, Abnormal findings on diagnostic imaging of                         other parts of digestive tract CPT copyright 2022 [employer]. All rights reserved. The codes documented in this report are preliminary and upon coder review may  be revised to meet current compliance requirements. Eather Colas MD, MD 03/02/2023 1:14:54 PM Number of Addenda: 0 Note Initiated On: 03/02/2023 12:48 PM Scope Withdrawal Time: 0 hours 7 minutes 10 seconds  Total Procedure Duration: 0 hours 13 minutes 48 seconds  Estimated Blood Loss:  Estimated blood loss was minimal.      [facility]

## 2023-03-02 NOTE — Transfer of Care (Signed)
Immediate Anesthesia Transfer of Care Note  Patient: Stacey [name]  Procedure(s) Performed: COLONOSCOPY WITH PROPOFOL POLYPECTOMY  Patient Location: PACU  Anesthesia Type:General  Level of Consciousness: drowsy  Airway & Oxygen Therapy: Patient Spontanous Breathing and Patient connected to face mask oxygen  Post-op Assessment: Report given to RN, Post -op Vital signs reviewed and stable, and Patient moving all extremities X 4  Post vital signs: Reviewed and stable  Last Vitals:  Vitals Value Taken Time  BP 90/49 03/02/23 1315  Temp    Pulse 93 03/02/23 1315  Resp 12   SpO2 97 % 03/02/23 1315    Last Pain:  Vitals:   03/02/23 1315  TempSrc:   PainSc: Asleep         Complications: No notable events documented.

## 2023-03-03 ENCOUNTER — Encounter: Payer: Self-pay | Admitting: Gastroenterology

## 2023-03-03 LAB — SURGICAL PATHOLOGY

## 2023-04-15 ENCOUNTER — Ambulatory Visit: Payer: Medicare HMO | Admitting: Dermatology

## 2023-04-15 DIAGNOSIS — D1801 Hemangioma of skin and subcutaneous tissue: Secondary | ICD-10-CM

## 2023-04-15 DIAGNOSIS — L820 Inflamed seborrheic keratosis: Secondary | ICD-10-CM | POA: Diagnosis not present

## 2023-04-15 DIAGNOSIS — L578 Other skin changes due to chronic exposure to nonionizing radiation: Secondary | ICD-10-CM | POA: Diagnosis not present

## 2023-04-15 DIAGNOSIS — Z85828 Personal history of other malignant neoplasm of skin: Secondary | ICD-10-CM

## 2023-04-15 DIAGNOSIS — Z1283 Encounter for screening for malignant neoplasm of skin: Secondary | ICD-10-CM

## 2023-04-15 DIAGNOSIS — L814 Other melanin hyperpigmentation: Secondary | ICD-10-CM

## 2023-04-15 DIAGNOSIS — Z8589 Personal history of malignant neoplasm of other organs and systems: Secondary | ICD-10-CM

## 2023-04-15 DIAGNOSIS — D229 Melanocytic nevi, unspecified: Secondary | ICD-10-CM

## 2023-04-15 DIAGNOSIS — Z85821 Personal history of Merkel cell carcinoma: Secondary | ICD-10-CM

## 2023-04-15 DIAGNOSIS — Z87898 Personal history of other specified conditions: Secondary | ICD-10-CM

## 2023-04-15 DIAGNOSIS — Z7189 Other specified counseling: Secondary | ICD-10-CM

## 2023-04-15 DIAGNOSIS — W908XXA Exposure to other nonionizing radiation, initial encounter: Secondary | ICD-10-CM | POA: Diagnosis not present

## 2023-04-15 DIAGNOSIS — D692 Other nonthrombocytopenic purpura: Secondary | ICD-10-CM

## 2023-04-15 DIAGNOSIS — L821 Other seborrheic keratosis: Secondary | ICD-10-CM

## 2023-04-15 NOTE — Progress Notes (Signed)
Follow-Up Visit   Subjective  Stacey [name] is a 82 y.o. female who presents for the following: Skin Cancer Screening and Full Body Skin Exam  The patient presents for Total-Body Skin Exam (TBSE) for skin cancer screening and mole check. The patient has spots, moles and lesions to be evaluated, some may be new or changing and the patient may have concern these could be cancer. She has an inflamed SK on the left dorsum foot that didn't go away after LN2 treatment. 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   The following portions of the chart were reviewed this encounter and updated as appropriate: medications, allergies, medical history  Review of Systems:  No other skin or systemic complaints except as noted in HPI or Assessment and Plan.  Objective  Well appearing patient in no apparent distress; mood and affect are within normal limits.  A full examination was performed including scalp, head, eyes, ears, nose, lips, neck, chest, axillae, abdomen, back, buttocks, bilateral upper extremities, bilateral lower extremities, hands, feet, fingers, toes, fingernails, and toenails. All findings within normal limits unless otherwise noted below.   Relevant physical exam findings are noted in the Assessment and Plan.  L dorsum foot x 1 (large), L distal dorsum foot x 1 (residual) (2) Erythematous stuck-on, waxy papule or plaque     Assessment & Plan   SKIN CANCER SCREENING PERFORMED TODAY.  ACTINIC DAMAGE - Chronic condition, secondary to cumulative UV/sun exposure - diffuse scaly erythematous macules with underlying dyspigmentation - Recommend daily broad spectrum sunscreen SPF 30+ to sun-exposed areas, reapply every 2 hours as needed.  - Staying in the shade or wearing long sleeves, sun glasses (UVA+UVB protection) and wide brim hats (4-inch brim around the entire circumference of the hat) are also  recommended for sun protection.  - Call for new or changing lesions.  LENTIGINES, SEBORRHEIC KERATOSES, HEMANGIOMAS - Benign normal skin lesions - Benign-appearing - Call for any changes  MELANOCYTIC NEVI - Tan-brown and/or pink-flesh-colored symmetric macules and papules - Benign appearing on exam today - Observation - Call clinic for new or changing moles - Recommend daily use of broad spectrum spf 30+ sunscreen to sun-exposed areas.   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 Exam: excision site clear to exam and palpation, no lymphadenopathy, leg with radiation changes Treatment Plan: Clear. Observe for recurrence. Call clinic for new or changing lesions.  Recommend regular skin exams, daily broad-spectrum spf 30+ sunscreen use, and photoprotection.    Finished radiation at [facility] f/u with [facility]  reviewed history.   HISTORY OF SQUAMOUS CELL CARCINOMA OF THE SKIN - No evidence of recurrence today - No lymphadenopathy - Recommend regular full body skin exams - Recommend daily broad spectrum sunscreen SPF 30+ to sun-exposed areas, reapply every 2 hours as needed.  - Call if any new or changing lesions are noted between office visits -L upper lat lip above vermillion border mohs 10/02/19   History of swelling of the left neck/inf ear/post neck in October [facility] doctor prescribed antibiotics. Improving now. Observation.  Exam today unremarkable. If recurrence or worsening, needs re-evaluation. Oct improved took antibiotic   Inflamed seborrheic keratosis (2) L dorsum foot x 1 (large), L distal dorsum foot x 1 (residual)  Symptomatic, irritating, patient would like treated.  Destruction of lesion - L dorsum foot x 1 (large), L distal dorsum foot x 1 (residual) (2) Complexity: simple   Destruction method: cryotherapy   Informed consent:  discussed and consent obtained   Timeout:  patient name, date of birth, surgical site, and procedure verified Lesion destroyed using liquid nitrogen: Yes   Region frozen until ice ball extended beyond lesion: Yes   Outcome: patient tolerated procedure well with no complications   Post-procedure details: wound care instructions given    Purpura - Chronic; persistent and recurrent.  Treatable, but not curable. - Violaceous macules and patches - Benign - Related to trauma, age, sun damage and/or use of blood thinners, chronic use of topical and/or oral steroids - Observe - Can use OTC arnica containing moisturizer such as Dermend Bruise Formula if desired - Call for worsening or other concerns  Return in about 6 months (around 10/13/2023) for TBSE, Hx Merkel Cell, Hx SCC.  Wendee Beavers, CMA, am acting as scribe for Armida Sans, MD .   Documentation: I have reviewed the above documentation for accuracy and completeness, and I agree with the above.  Armida Sans, MD

## 2023-04-15 NOTE — Patient Instructions (Signed)

## 2023-04-20 ENCOUNTER — Encounter: Payer: Self-pay | Admitting: Dermatology

## 2023-07-09 ENCOUNTER — Other Ambulatory Visit: Payer: Self-pay | Admitting: Dermatology

## 2023-07-09 DIAGNOSIS — B001 Herpesviral vesicular dermatitis: Secondary | ICD-10-CM

## 2023-08-05 ENCOUNTER — Ambulatory Visit: Payer: Medicare HMO | Admitting: Dermatology

## 2023-08-05 ENCOUNTER — Encounter: Payer: Self-pay | Admitting: Dermatology

## 2023-08-05 ENCOUNTER — Ambulatory Visit: Admitting: Dermatology

## 2023-08-05 DIAGNOSIS — W908XXA Exposure to other nonionizing radiation, initial encounter: Secondary | ICD-10-CM | POA: Diagnosis not present

## 2023-08-05 DIAGNOSIS — L578 Other skin changes due to chronic exposure to nonionizing radiation: Secondary | ICD-10-CM

## 2023-08-05 DIAGNOSIS — D492 Neoplasm of unspecified behavior of bone, soft tissue, and skin: Secondary | ICD-10-CM

## 2023-08-05 DIAGNOSIS — L820 Inflamed seborrheic keratosis: Secondary | ICD-10-CM

## 2023-08-05 DIAGNOSIS — C4A72 Merkel cell carcinoma of left lower limb, including hip: Secondary | ICD-10-CM | POA: Diagnosis not present

## 2023-08-05 DIAGNOSIS — D489 Neoplasm of uncertain behavior, unspecified: Secondary | ICD-10-CM

## 2023-08-05 NOTE — Progress Notes (Signed)
 Follow-Up Visit   Subjective  Stacey [name] is a 83 y.o. female who presents for the following: patient here for 6 month follow up for isk treated at left dorsum foot. Reports still some areas left. Also reports a bump she noticed in last week at her left calf.   The patient has spots, moles and lesions to be evaluated, some may be new or changing and the patient may have concern these could be cancer.   The following portions of the chart were reviewed this encounter and updated as appropriate: medications, allergies, medical history  Review of Systems:  No other skin or systemic complaints except as noted in HPI or Assessment and Plan.  Objective  Well appearing patient in no apparent distress; mood and affect are within normal limits.   A focused examination was performed of the following areas: Left foot, left leg   Relevant exam findings are noted in the Assessment and Plan.  left dorsum foot x 6 (6) Erythematous stuck-on, waxy papule or plaque See photos  5 x left dorsum foot 1 wart vs isk at left dorsum foot  left medial calf 0.7 cm firm papule    Assessment & Plan  INFLAMED SEBORRHEIC KERATOSIS (6) left dorsum foot x 6 (6) Symptomatic, irritating, patient would like treated.  Photos today  1 wart vs isk at left dorsum foot, May consider bx if not improving. Will recheck Destruction of lesion - left dorsum foot x 6 (6) Complexity: simple   Destruction method: cryotherapy   Informed consent: discussed and consent obtained   Timeout:  patient name, date of birth, surgical site, and procedure verified Lesion destroyed using liquid nitrogen: Yes   Region frozen until ice ball extended beyond lesion: Yes   Outcome: patient tolerated procedure well with no complications   Post-procedure details: wound care instructions given   NEOPLASM OF UNCERTAIN BEHAVIOR left medial calf Skin excision  Excision method:  punch Lesion length (cm):  0.7 Lesion width  (cm):  0.7 Total excision diameter (cm):  0.7 Informed consent: discussed and consent obtained   Timeout: patient name, date of birth, surgical site, and procedure verified   Procedure prep:  Patient was prepped and draped in usual sterile fashion Prep type:  Isopropyl alcohol and povidone-iodine Anesthesia: the lesion was anesthetized in a standard fashion   Anesthetic:  1% lidocaine w/ epinephrine 1-100,000 buffered w/ 8.4% NaHCO3 Instrument used comment:  6 mm punch Hemostasis achieved with: suture   Hemostasis achieved with comment:  3.0 Nylon suture Outcome: patient tolerated procedure well with no complications   Post-procedure details: sterile dressing applied and wound care instructions given   Dressing type: bandage and pressure dressing (Mupirocin)   Specimen 1 - Surgical pathology Differential Diagnosis: cyst r/o metastatic Merkel cell   Check Margins: No Cyst r/o Merkle Cell  ACTINIC SKIN DAMAGE   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  - Recommend staying in the shade or wearing long sleeves, sun glasses (UVA+UVB protection) and wide brim hats (4-inch brim around the entire circumference of the hat). - Call for new or changing lesions.  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   Return for 1 week suture removal and tbse hx of merkle, hx of scc, hx of aks.  IAsher Muir, CMA, am acting as scribe for Armida Sans, MD.   Documentation: I have reviewed the above documentation for accuracy and completeness, and I agree with the above.  Armida Sans,  MD

## 2023-08-05 NOTE — Patient Instructions (Addendum)
Biopsy Wound Care Instructions  Leave the original bandage on for 24 hours if possible.  If the bandage becomes soaked or soiled before that time, it is OK to remove it and examine the wound.  A small amount of post-operative bleeding is normal.  If excessive bleeding occurs, remove the bandage, place gauze over the site and apply continuous pressure (no peeking) over the area for 30 minutes. If this does not work, please call our clinic as soon as possible or page your doctor if it is after hours.   Once a day, cleanse the wound with soap and water. It is fine to shower. If a thick crust develops you may use a Q-tip dipped into dilute hydrogen peroxide (mix 1:1 with water) to dissolve it.  Hydrogen peroxide can slow the healing process, so use it only as needed.    After washing, apply petroleum jelly (Vaseline) or an antibiotic ointment if your doctor prescribed one for you, followed by a bandage.    For best healing, the wound should be covered with a layer of ointment at all times. If you are not able to keep the area covered with a bandage to hold the ointment in place, this may mean re-applying the ointment several times a day.  Continue this wound care until the wound has healed and is no longer open.   Itching and mild discomfort is normal during the healing process. However, if you develop pain or severe itching, please call our office.   If you have any discomfort, you can take Tylenol (acetaminophen) or ibuprofen as directed on the bottle. (Please do not take these if you have an allergy to them or cannot take them for another reason).  Some redness, tenderness and white or yellow material in the wound is normal healing.  If the area becomes very sore and red, or develops a thick yellow-green material (pus), it may be infected; please notify us.    If you have stitches, return to clinic as directed to have the stitches removed. You will continue wound care for 2-3 days after the stitches  are removed.   Wound healing continues for up to one year following surgery. It is not unusual to experience pain in the scar from time to time during the interval.  If the pain becomes severe or the scar thickens, you should notify the office.    A slight amount of redness in a scar is expected for the first six months.  After six months, the redness will fade and the scar will soften and fade.  The color difference becomes less noticeable with time.  If there are any problems, return for a post-op surgery check at your earliest convenience.  To improve the appearance of the scar, you can use silicone scar gel, cream, or sheets (such as Mederma or Serica) every night for up to one year. These are available over the counter (without a prescription).  Please call our office at [phone] for any questions or concerns.       Melanoma ABCDEs  Melanoma is the most dangerous type of skin cancer, and is the leading cause of death from skin disease.  You are more likely to develop melanoma if you: Have light-colored skin, light-colored eyes, or red or blond hair Spend a lot of time in the sun Tan regularly, either outdoors or in a tanning bed Have had blistering sunburns, especially during childhood Have a close family member who has had a melanoma Have atypical moles  or large birthmarks  Early detection of melanoma is key since treatment is typically straightforward and cure rates are extremely high if we catch it early.   The first sign of melanoma is often a change in a mole or a new dark spot.  The ABCDE system is a way of remembering the signs of melanoma.  A for asymmetry:  The two halves do not match. B for border:  The edges of the growth are irregular. C for color:  A mixture of colors are present instead of an even brown color. D for diameter:  Melanomas are usually (but not always) greater than 6mm - the size of a pencil eraser. E for evolution:  The spot keeps changing in  size, shape, and color.  Please check your skin once per month between visits. You can use a small mirror in front and a large mirror behind you to keep an eye on the back side or your body.   If you see any new or changing lesions before your next follow-up, please call to schedule a visit.  Please continue daily skin protection including broad spectrum sunscreen SPF 30+ to sun-exposed areas, reapplying every 2 hours as needed when you're outdoors.   Staying in the shade or wearing long sleeves, sun glasses (UVA+UVB protection) and wide brim hats (4-inch brim around the entire circumference of the hat) are also recommended for sun protection.    Due to recent changes in healthcare laws, you may see results of your pathology and/or laboratory studies on MyChart before the doctors have had a chance to review them. We understand that in some cases there may be results that are confusing or concerning to you. Please understand that not all results are received at the same time and often the doctors may need to interpret multiple results in order to provide you with the best plan of care or course of treatment. Therefore, we ask that you please give [place] 2 business days to thoroughly review all your results before contacting the office for clarification. Should we see a critical lab result, you will be contacted sooner.   If You Need Anything After Your Visit  If you have any questions or concerns for your doctor, please call our main line at [phone] and press option 4 to reach your doctor's medical assistant. If no one answers, please leave a voicemail as directed and we will return your call as soon as possible. Messages left after 4 pm will be answered the following business day.   You may also send [place] a message via MyChart. We typically respond to MyChart messages within 1-2 business days.  For prescription refills, please ask your pharmacy to contact our office. Our fax number is  [phone].  If you have an urgent issue when the clinic is closed that cannot wait until the next business day, you can page your doctor at the number below.    Please note that while we do our best to be available for urgent issues outside of office hours, we are not available 24/7.   If you have an urgent issue and are unable to reach [place], you may choose to seek medical care at your doctor's office, retail clinic, urgent care center, or emergency room.  If you have a medical emergency, please immediately call 911 or go to the emergency department.  Pager Numbers  - Dr. Gwen Pounds: [phone]  - Dr. Roseanne Reno: [phone]  - Dr. Katrinka Blazing: [phone]   In the event of  inclement weather, please call our main line at [phone] for an update on the status of any delays or closures.  Dermatology Medication Tips: Please keep the boxes that topical medications come in in order to help keep track of the instructions about where and how to use these. Pharmacies typically print the medication instructions only on the boxes and not directly on the medication tubes.   If your medication is too expensive, please contact our office at [phone] option 4 or send [place] a message through MyChart.   We are unable to tell what your co-pay for medications will be in advance as this is different depending on your insurance coverage. However, we may be able to find a substitute medication at lower cost or fill out paperwork to get insurance to cover a needed medication.   If a prior authorization is required to get your medication covered by your insurance company, please allow [place] 1-2 business days to complete this process.  Drug prices often vary depending on where the prescription is filled and some pharmacies may offer cheaper prices.  The website www.goodrx.com contains coupons for medications through different pharmacies. The prices here do not account for what the cost may be with help from  insurance (it may be cheaper with your insurance), but the website can give you the price if you did not use any insurance.  - You can print the associated coupon and take it with your prescription to the pharmacy.  - You may also stop by our office during regular business hours and pick up a GoodRx coupon card.  - If you need your prescription sent electronically to a different pharmacy, notify our office through [facility] or by phone at [phone] option 4.     Si Usted Necesita Algo Despus de Su Visita  Tambin puede enviarnos un mensaje a travs de Clinical cytogeneticist. Por lo general respondemos a los mensajes de MyChart en el transcurso de 1 a 2 das hbiles.  Para renovar recetas, por favor pida a su farmacia que se ponga en contacto con nuestra oficina. Annie Sable de fax es [place] [phone].  Si tiene un asunto urgente cuando la clnica est cerrada y que no puede esperar hasta el siguiente da hbil, puede llamar/localizar a su doctor(a) al nmero que aparece a continuacin.   Por favor, tenga en cuenta que aunque hacemos todo lo posible para estar disponibles para asuntos urgentes fuera del horario de [place], no estamos disponibles las 24 horas del da, los 7 [address]  [address] de la [place].   Si tiene un problema urgente y no puede comunicarse con nosotros, puede optar por buscar atencin mdica  en el consultorio de su doctor(a), en una clnica privada, en un centro de atencin urgente o en una sala de emergencias.  Si tiene Engineer, drilling, por favor llame inmediatamente al 911 o vaya a la sala de emergencias.  Nmeros de bper  - Dr. Gwen Pounds: [phone]  - Dra. Roseanne Reno: [phone]  - Dr. Katrinka Blazing: [phone]   En caso de inclemencias del tiempo, por favor llame a Lacy Duverney principal al [phone] para una actualizacin sobre el [place] de cualquier retraso o cierre.  Consejos para la medicacin en dermatologa: Por favor, guarde las cajas en las que vienen los  medicamentos de uso tpico para ayudarle a seguir las instrucciones sobre dnde y cmo usarlos. Las farmacias generalmente imprimen las instrucciones del medicamento slo en las cajas y no directamente en los tubos del [place].   Si su medicamento  es muy caro, por favor, pngase en contacto con Rolm Gala llamando al [phone] y presione la opcin 4 o envenos un mensaje a travs de Clinical cytogeneticist.   No podemos decirle cul ser su copago por los medicamentos por adelantado ya que esto es diferente dependiendo de la cobertura de su seguro. Sin embargo, es posible que podamos encontrar un medicamento sustituto a Audiological scientist un formulario para que el seguro cubra el medicamento que se considera necesario.   Si se requiere una autorizacin previa para que su compaa de seguros [place] su medicamento, por favor permtanos de 1 a 2 das hbiles para completar [address]  Los precios de los medicamentos varan con frecuencia dependiendo del Environmental consultant de dnde se surte la receta y alguna farmacias pueden ofrecer precios ms baratos.  El sitio web www.goodrx.com tiene cupones para medicamentos de Health and safety inspector. Los precios aqu no tienen en cuenta lo que podra costar con la ayuda del seguro (puede ser ms barato con su seguro), pero el sitio web puede darle el precio si no utiliz Tourist information centre manager.  - Puede imprimir el cupn correspondiente y llevarlo con su receta a la farmacia.  - Tambin puede pasar por nuestra oficina durante el horario de atencin regular y Education officer, museum una tarjeta de cupones de GoodRx.  - Si necesita que su receta se enve electrnicamente a una farmacia diferente, informe a nuestra oficina a travs de MyChart de [place] o por telfono llamando al [phone] y presione la opcin 4.

## 2023-08-10 ENCOUNTER — Telehealth: Payer: Self-pay

## 2023-08-10 LAB — SURGICAL PATHOLOGY

## 2023-08-10 NOTE — Telephone Encounter (Signed)
 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

## 2023-08-12 ENCOUNTER — Ambulatory Visit (INDEPENDENT_AMBULATORY_CARE_PROVIDER_SITE_OTHER): Admitting: Dermatology

## 2023-08-12 DIAGNOSIS — Z7189 Other specified counseling: Secondary | ICD-10-CM

## 2023-08-12 DIAGNOSIS — C7B1 Secondary Merkel cell carcinoma: Secondary | ICD-10-CM

## 2023-08-12 NOTE — Patient Instructions (Signed)
After Suture Removal  If your medical team has placed Steri-Strips (white adhesive strips covering the surgical site to provide extra support): Keep the area dry until they fall off.  Do not peel them off. Just let them fall off on their own.  If the edges peel up, you can trim them with scissors.   If your team has not placed Steri-Strips: Wash the area daily with soap and water. Then coat the incision site with plain Vaseline and cover with a bandage. Do this daily for 5 days after the sutures are removed. After that, no additional wound care is generally needed.  However, if you would like to help fade the scar, you can apply a silicone scar cream, gel or sheet every night. The scar will remodel for one year after the procedure. If a skin cancer was removed, be sure to keep your appointment with your dermatologist for follow-up and let your dermatology team know if you have any new or changing spots between visits.    Please call our office at [phone] for any questions or concerns.Due to recent changes in healthcare laws, you may see results of your pathology and/or laboratory studies on MyChart before the doctors have had a chance to review them. We understand that in some cases there may be results that are confusing or concerning to you. Please understand that not all results are received at the same time and often the doctors may need to interpret multiple results in order to provide you with the best plan of care or course of treatment. Therefore, we ask that you please give [place] 2 business days to thoroughly review all your results before contacting the office for clarification. Should we see a critical lab result, you will be contacted sooner.   If You Need Anything After Your Visit  If you have any questions or concerns for your doctor, please call our main line at [phone] and press option 4 to reach your doctor's medical assistant. If no one answers, please leave a  voicemail as directed and we will return your call as soon as possible. Messages left after 4 pm will be answered the following business day.   You may also send [place] a message via MyChart. We typically respond to MyChart messages within 1-2 business days.  For prescription refills, please ask your pharmacy to contact our office. Our fax number is [phone].  If you have an urgent issue when the clinic is closed that cannot wait until the next business day, you can page your doctor at the number below.    Please note that while we do our best to be available for urgent issues outside of office hours, we are not available 24/7.   If you have an urgent issue and are unable to reach [place], you may choose to seek medical care at your doctor's office, retail clinic, urgent care center, or emergency room.  If you have a medical emergency, please immediately call 911 or go to the emergency department.  Pager Numbers  - Dr. Gwen Pounds: [phone]  - Dr. Roseanne Reno: [phone]  - Dr. Katrinka Blazing: [phone]   In the event of inclement weather, please call our main line at [phone] for an update on the status of any delays or closures.  Dermatology Medication Tips: Please keep the boxes that topical medications come in in order to help keep track of the instructions about where and how to use these. Pharmacies typically print the medication instructions only on  the boxes and not directly on the medication tubes.   If your medication is too expensive, please contact our office at [phone] option 4 or send [place] a message through MyChart.   We are unable to tell what your co-pay for medications will be in advance as this is different depending on your insurance coverage. However, we may be able to find a substitute medication at lower cost or fill out paperwork to get insurance to cover a needed medication.   If a prior authorization is required to get your medication covered by your insurance  company, please allow [place] 1-2 business days to complete this process.  Drug prices often vary depending on where the prescription is filled and some pharmacies may offer cheaper prices.  The website www.goodrx.com contains coupons for medications through different pharmacies. The prices here do not account for what the cost may be with help from insurance (it may be cheaper with your insurance), but the website can give you the price if you did not use any insurance.  - You can print the associated coupon and take it with your prescription to the pharmacy.  - You may also stop by our office during regular business hours and pick up a GoodRx coupon card.  - If you need your prescription sent electronically to a different pharmacy, notify our office through [facility] or by phone at [phone] option 4.     Si Usted Necesita Algo Despus de Su Visita  Tambin puede enviarnos un mensaje a travs de Clinical cytogeneticist. Por lo general respondemos a los mensajes de MyChart en el transcurso de 1 a 2 das hbiles.  Para renovar recetas, por favor pida a su farmacia que se ponga en contacto con nuestra oficina. Annie Sable de fax es [place] [phone].  Si tiene un asunto urgente cuando la clnica est cerrada y que no puede esperar hasta el siguiente da hbil, puede llamar/localizar a su doctor(a) al nmero que aparece a continuacin.   Por favor, tenga en cuenta que aunque hacemos todo lo posible para estar disponibles para asuntos urgentes fuera del horario de [place], no estamos disponibles las 24 horas del da, los 7 [address]  [address] de [place].   Si tiene un problema urgente y no puede comunicarse con nosotros, puede optar por buscar atencin mdica  en el consultorio de su doctor(a), en una clnica privada, en un centro de atencin urgente o en una sala de emergencias.  Si tiene Engineer, drilling, por favor llame inmediatamente al 911 o vaya a la sala de emergencias.  Nmeros de bper  - Dr.  Gwen Pounds: [phone]  - Dra. Roseanne Reno: [phone]  - Dr. Katrinka Blazing: [phone]   En caso de inclemencias del tiempo, por favor llame a Lacy Duverney principal al [phone] para una actualizacin sobre el [address] de cualquier retraso o cierre.  Consejos para la medicacin en dermatologa: Por favor, guarde las cajas en las que vienen los medicamentos de uso tpico para ayudarle a seguir las instrucciones sobre dnde y cmo usarlos. Las farmacias generalmente imprimen las instrucciones del medicamento slo en las cajas y no directamente en los tubos del [place].   Si su medicamento es muy caro, por favor, pngase en contacto con Rolm Gala llamando al [phone] y presione la opcin 4 o envenos un mensaje a travs de Clinical cytogeneticist.   No podemos decirle cul ser su copago por los medicamentos por adelantado ya que esto es diferente dependiendo de la cobertura de su seguro. Sin embargo, es posible  que podamos encontrar un medicamento sustituto a Audiological scientist un formulario para que el seguro cubra el medicamento que se considera necesario.   Si se requiere una autorizacin previa para que su compaa de seguros [place] su medicamento, por favor permtanos de 1 a 2 das hbiles para completar [address]  Los precios de los medicamentos varan con frecuencia dependiendo del Environmental consultant de dnde se surte la receta y alguna farmacias pueden ofrecer precios ms baratos.  El sitio web www.goodrx.com tiene cupones para medicamentos de Health and safety inspector. Los precios aqu no tienen en cuenta lo que podra costar con la ayuda del seguro (puede ser ms barato con su seguro), pero el sitio web puede darle el precio si no utiliz Tourist information centre manager.  - Puede imprimir el cupn correspondiente y llevarlo con su receta a la farmacia.  - Tambin puede pasar por nuestra oficina durante el horario de atencin regular y Education officer, museum una tarjeta de cupones de GoodRx.  - Si necesita que su receta se enve  electrnicamente a una farmacia diferente, informe a nuestra oficina a travs de MyChart de [place] o por telfono llamando al [phone] y presione la opcin 4.

## 2023-08-12 NOTE — Progress Notes (Signed)
   Follow-Up Visit   Subjective  Stacey [name] is a 83 y.o. female who presents for the following: Suture removal and discussion of results.  Pathology showed METASTATIC MERKEL CELL CARCINOMA, MARGINS INVOLVED, Recurrent / Metastatic Merkel Cell Carcinoma Will notify [facility] oncology where she is being followed for this diagnosis tomorrow 4 pm August 13, 2023  The following portions of the chart were reviewed this encounter and updated as appropriate: medications, allergies, medical history  Review of Systems:  No other skin or systemic complaints except as noted in HPI or Assessment and Plan.  Objective  Well appearing patient in no apparent distress; mood and affect are within normal limits.  Areas Examined: left medial calf   Relevant physical exam findings are noted in the Assessment and Plan.   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 - Wound cleansed, sutures removed, wound cleansed and steri strips applied.  - Discussed pathology results showing Skin, left medial calf :      METASTATIC MERKEL CELL CARCINOMA, MARGINS INVOLVED, SEE DESCRIPTION  - Patient advised to keep steri-strips dry until they fall off. - Scars remodel for a full year. - Once steri-strips fall off, patient can apply over-the-counter silicone scar cream once to twice a day to help with scar remodeling if desired. - Patient advised to call with any concerns or if they notice any new or changing lesions.  Metastatic Merkel Cell Carcinoma Margins Involved Prior surgery & lymph nodes removal and radiation to treat this area [facility] will see patient tomorrow 08/13/2023 at 4 pm We will have [facility] oncology and surgical oncology direct any further evaluation and treatment   exam: healing bx site.   Treatment Plan: Recommend follow up with [facility] and  surgical oncology with  Lesle Reek. Glennis Brink, MD, and Nicky Pugh MD surgical oncologist Patient has an appointment at [facility] surgical oncology tomorrow 4 PM Friday, August 13, 2023.   She also appears to have an appointment August 18, 2023.  Discussion and extensive counseling performed today.  Return in about 6 months (around 02/12/2024) for follow up.  IAsher Muir, CMA, am acting as scribe for Armida Sans, MD.   Documentation: I have reviewed the above documentation for accuracy and completeness, and I agree with the above.  Armida Sans, MD

## 2023-08-26 ENCOUNTER — Ambulatory Visit (INDEPENDENT_AMBULATORY_CARE_PROVIDER_SITE_OTHER): Admitting: Nurse Practitioner

## 2023-08-26 ENCOUNTER — Encounter (INDEPENDENT_AMBULATORY_CARE_PROVIDER_SITE_OTHER): Payer: Self-pay | Admitting: Nurse Practitioner

## 2023-08-26 VITALS — BP 125/78 | HR 86 | Resp 18 | Ht 63.0 in | Wt 193.8 lb

## 2023-08-26 DIAGNOSIS — I10 Essential (primary) hypertension: Secondary | ICD-10-CM

## 2023-08-26 DIAGNOSIS — I890 Lymphedema, not elsewhere classified: Secondary | ICD-10-CM

## 2023-08-26 DIAGNOSIS — E785 Hyperlipidemia, unspecified: Secondary | ICD-10-CM

## 2023-08-29 NOTE — Progress Notes (Signed)
 Subjective:    Patient ID: Stacey [name], female    DOB: 1940/07/11, 83 y.o.   MRN: [id number] Chief Complaint  Patient presents with   New Patient (Initial Visit)    np. consult. venous insufficiency of RLE. Sullivan Lone, richard    The patient is an 83 year old female who presents today for evaluation of her bilateral lower extremity edema.  She has a known history of lymphedema.  She was recently diagnosed with cellulitis.  She is also a previous patient of ours and was last seen in 2022.  She notes that she does not wear compression consistently.  In addition, she has a lymphedema pump but only uses it perhaps once per month.  Currently she has no open wounds or ulcerations.    Review of Systems  Cardiovascular:  Positive for leg swelling.  All other systems reviewed and are negative.      Objective:   Physical Exam Vitals reviewed.  HENT:     Head: Normocephalic.  Cardiovascular:     Rate and Rhythm: Normal rate.     Pulses: Normal pulses.  Pulmonary:     Effort: Pulmonary effort is normal.  Musculoskeletal:     Right lower leg: Edema present.     Left lower leg: Edema present.  Skin:    General: Skin is warm and dry.  Neurological:     Mental Status: She is alert and oriented to person, place, and time.  Psychiatric:        Mood and Affect: Mood normal.        Behavior: Behavior normal.        Thought Content: Thought content normal.        Judgment: Judgment normal.     BP 125/78   Pulse 86   Resp 18   Ht 5\' 3"  (1.6 m)   Wt 193 lb 12.8 oz (87.9 kg)   BMI 34.33 kg/m   Past Medical History:  Diagnosis Date   Abnormal nuclear stress test 05/24/2017   Actinic keratosis    Anxiety    a.) on BZO (diazepam) PRN   Aortic atherosclerosis (HCC)    Arthritis    Asthma    Atrial fibrillation, transient (HCC)    a.) CHA2DS2VASc = 7 (age x 2, sex, HTN, CVA x 2, prior MI);  b.) rate/rhythm maintained on oral metoprolol tartrate; chronically anticoagulated with  apixaban   Benign neoplasm of ascending colon    Benign neoplasm of descending colon    CAD (coronary artery disease)    a.) LHC at [facility] 02/20/2017: 80% oRI-RI, 90% o-pLAD, 95% mLAD, 99% p-mRCA --> urgent CVTS consult; b.) 3v CABG at [facility]; c.) LHC 05/24/2017: 100% o-pLAD, 80% oRI-RI, 100% p-mRCA, SVG-RCA patent, SVG-oD1 with min luminal irregs, LIMA-LAD patent -- med mgmt.   Chronic lower back pain    CKD (chronic kidney disease) stage 3, GFR 30-59 ml/min (HCC)    Complication of anesthesia    DDD (degenerative disc disease), cervical    Depression    Dyspnea    Family history of adverse reaction to anesthesia    most of family - PONV   Fibromyalgia    GERD (gastroesophageal reflux disease)    Glaucoma    History of 2019 novel coronavirus disease (COVID-19) 02/02/2019   History of bilateral cataract extraction    HTN (hypertension) 03/09/2007   Hyperlipidemia LDL goal <70 03/25/1993   Hypertension    Hypothyroidism    Insomnia    a.) on hypnotic (  zolpidem) PRN   Long term current use of anticoagulant    a.) apixaban   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   L calf, WLE at [facility]   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/2023   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  Will notify [facility] oncology where she is being followed for this diagnosis   Migraines    a.) 1-2x/mo   Motion sickness    all moving vehicles   NSTEMI (non-ST elevated myocardial infarction) (HCC) 02/20/2017   a.) LHC at [facility] 02/20/2017: 80% oRI-RI, 90% o-pLAD, 95% mLAD, 99% p-mRCA --> urgent CVTS consult; b.) 3v CABG at [facility]   OSA on CPAP    PONV (postoperative nausea and vomiting)    S/P CABG x 3    a.) LIMA-LAD, SVG-dRCA, SVG-D1   Squamous cell carcinoma of skin 07/25/2019   Left upper lateral lip above vermillion border. WD SCC with superficial infiltration., MOHs 10/02/19   Stroke (HCC) 10/22/2017   a.) CT/MRI 10/22/2017 - RIGHT occipitoparietal infarct   Vitamin D deficiency     Social History    Socioeconomic History   Marital status: Married    Spouse name: Merlyn Albert   Number of children: 3   Years of education: Not on file   Highest education level: GED or equivalent  Occupational History    Employer: [employer]  Tobacco Use   Smoking status: Former    Current packs/day: 0.00    Types: Cigarettes    Quit date: 05/26/1975    Years since quitting: 48.2   Smokeless tobacco: Never   Tobacco comments:    quit 45  years ago   Vaping Use   Vaping status: Never Used  Substance and Sexual Activity   Alcohol use: No   Drug use: No   Sexual activity: Not Currently  Other Topics Concern   Not on file  Social History Narrative   Not on file   Social Drivers of Health   Financial Resource Strain: Low Risk  (08/14/2021)   Overall Financial Resource Strain (CARDIA)    Difficulty of Paying Living Expenses: Not hard at all  Food Insecurity: No Food Insecurity (03/26/2022)   Hunger Vital Sign    Worried About Running Out of Food in the Last Year: Never true    Ran Out of Food in the Last Year: Never true  Transportation Needs: No Transportation Needs (03/26/2022)   PRAPARE - Administrator, Civil Service (Medical): No    Lack of Transportation (Non-Medical): No  Physical Activity: Insufficiently Active (08/14/2021)   Exercise Vital Sign    Days of Exercise per Week: 2 days    Minutes of Exercise per Session: 20 min  Stress: No Stress Concern Present (08/14/2021)   [employer] of Occupational Health - Occupational Stress Questionnaire    Feeling of Stress : Only a little  Social Connections: Unknown (10/07/2021)   [facility]   Social Network    Social Network: Not on file  Intimate Partner Violence: Not At Risk (03/26/2022)   Humiliation, Afraid, Rape, and Kick questionnaire    Fear of Current or Ex-Partner: No    Emotionally Abused: No    Physically Abused: No    Sexually Abused: No    Past Surgical History:   Procedure Laterality Date   ABDOMINAL HYSTERECTOMY     APPENDECTOMY     BLADDER SURGERY     CATARACT EXTRACTION W/ INTRAOCULAR LENS  IMPLANT,  BILATERAL     COLONOSCOPY WITH PROPOFOL N/A 01/17/2016   Procedure: COLONOSCOPY WITH PROPOFOL;  Surgeon: Midge Minium, MD;  Location: [facility];  Service: Endoscopy;  Laterality: N/A;   COLONOSCOPY WITH PROPOFOL N/A 03/02/2023   Procedure: COLONOSCOPY WITH PROPOFOL;  Surgeon: Regis Bill, MD;  Location: [facility] ENDOSCOPY;  Service: Endoscopy;  Laterality: N/A;  "PROBABLY CAN NOT BE EARLIER THAN 11:45 AM ARRIVAL" PER HUSBAND DUE TO TRANSPORTATION   CORONARY ARTERY BYPASS GRAFT N/A 02/20/2017   Procedure: CORONARY ARTERY BYPASS GRAFTING (CABG) x , three using left internal mammary artery to left anterior descending coronary artery and right greater saphenous vein harvested endoscopically to distal right and diagonal coronary arteries.;  Surgeon: Delight Ovens, MD;  Location: [facility] OR;  Service: Open Heart Surgery;  Laterality: N/A;   LEFT HEART CATH AND CORONARY ANGIOGRAPHY N/A 02/20/2017   Procedure: LEFT HEART CATH AND CORONARY ANGIOGRAPHY;  Surgeon: Marcina Millard, MD;  Location: [facility] INVASIVE CV LAB;  Service: Cardiovascular;  Laterality: N/A;   LEFT HEART CATH AND CORS/GRAFTS ANGIOGRAPHY N/A 05/24/2017   Procedure: LEFT HEART CATH AND CORS/GRAFTS ANGIOGRAPHY;  Surgeon: Tonny Bollman, MD;  Location: [facility] INVASIVE CV LAB;  Service: Cardiovascular;  Laterality: N/A;   NASAL SINUS SURGERY     POLYPECTOMY  01/17/2016   Procedure: POLYPECTOMY;  Surgeon: Midge Minium, MD;  Location: [facility];  Service: Endoscopy;;   POLYPECTOMY  03/02/2023   Procedure: POLYPECTOMY;  Surgeon: Regis Bill, MD;  Location: [facility] ENDOSCOPY;  Service: Endoscopy;;   RIGHT OOPHORECTOMY     TEE WITHOUT CARDIOVERSION N/A 02/20/2017   Procedure: TRANSESOPHAGEAL ECHOCARDIOGRAM (TEE);  Surgeon: Delight Ovens, MD;  Location: [facility] OR;  Service: Open  Heart Surgery;  Laterality: N/A;   TOTAL SHOULDER ARTHROPLASTY Right 03/26/2022   Procedure: TOTAL SHOULDER ARTHROPLASTY- reverse;  Surgeon: Ross Marcus, MD;  Location: [facility] ORS;  Service: Orthopedics;  Laterality: Right;    Family History  Problem Relation Age of Onset   Alzheimer's disease Mother    Kidney cancer Mother    Heart attack Father    Pancreatic cancer Sister    Heart attack Brother 73   Ovarian cancer Sister    Kidney disease Sister        dialysis   Cancer Sister        uterin   Diabetes Sister    Heart attack Sister    Heart attack Brother 24   Ovarian cancer Sister     Allergies  Allergen Reactions   Cephalexin Anaphylaxis   Atorvastatin     myalgia   Morphine And Codeine Hives and Nausea And Vomiting   Other Hives and Other (See Comments)    Muscle pain   Procaine Other (See Comments)    Chest pain Chest pain   Shellfish Allergy Swelling    angioedema   Statins Other (See Comments)    Muscle pain   Tomato Other (See Comments)    (by testing) - also beans, wheat, peas (by testing) - also beans, wheat, peas   Penicillins Hives and Rash    Has patient had a PCN reaction causing immediate rash, facial/tongue/throat swelling, SOB or lightheadedness with hypotension: Yes Has patient had a PCN reaction causing severe rash involving mucus membranes or skin necrosis: No Has patient had a PCN reaction that required hospitalization: No Has patient had a PCN reaction occurring within the last 10 years: Yes If all of the above answers are "NO", then may proceed with Cephalosporin use.  Latest Ref Rng & Units 05/22/2022   11:02 AM 05/15/2022    1:52 PM 03/27/2022    4:43 AM  CBC  WBC 3.4 - 10.8 x10E3/uL 7.7  6.1  12.2   Hemoglobin 11.1 - 15.9 g/dL 16.1  09.6  04.5   Hematocrit 34.0 - 46.6 % 40.4  38.4  36.7   Platelets 150 - 450 x10E3/uL 180  169  164       CMP     Component Value Date/Time   NA 141 05/22/2022 1102   K 3.4 (L) 05/22/2022  1102   CL 94 (L) 05/22/2022 1102   CO2 28 05/22/2022 1102   GLUCOSE 128 (H) 05/22/2022 1102   GLUCOSE 170 (H) 03/27/2022 0443   BUN 23 05/22/2022 1102   CREATININE 1.06 (H) 05/22/2022 1102   CREATININE 0.93 04/29/2017 0801   CALCIUM 9.2 05/22/2022 1102   PROT 5.8 (L) 05/22/2022 1102   ALBUMIN 3.9 05/22/2022 1102   AST 17 05/22/2022 1102   ALT 11 05/22/2022 1102   ALKPHOS 88 05/22/2022 1102   BILITOT 0.4 05/22/2022 1102   EGFR 53 (L) 05/22/2022 1102   GFRNONAA >60 03/27/2022 0443   GFRNONAA 61 04/29/2017 0801     No results found.     Assessment & Plan:     1. Essential hypertension Continue antihypertensive medications as already ordered, these medications have been reviewed and there are no changes at this time.   2. Hyperlipidemia LDL goal <70 Continue statin as ordered and reviewed, no changes at this time    3. Lymphedema Today in order to help gain control the patient's lower extremity edema we will place her into bilateral Unna boots.  We have done this with the hope that the patient will be able to transition into medical grade compression socks.  The patient has a lymphedema pump at home but she does not utilize it.  We discussed the importance of using it, but will be more important when she has her Unna boots removed.  We discussed that cellulitis and not controlling her lymphedema are also connected.  The patient will remain in the boot for the next 4 weeks and we will evaluate progress at that time.     Current Outpatient Medications on File Prior to Visit  Medication Sig Dispense Refill   acyclovir (ZOVIRAX) 400 MG tablet TAKE ONE TABLET BY MOUTH 3 TIMES DAILY FOR 5 DAYS TO START IMMEDIATELY WITH EACH EPISODE OF FEVER BLISTER.MAY ALSO TAKE ONE DAILY FOR PREVENTION 30 tablet 11   albuterol (VENTOLIN HFA) 108 (90 Base) MCG/ACT inhaler INHALE 2 PUFFS BY MOUTH EVERY 4 TO 6 HOURS AS NEEDED FOR SHORTNESS OF BREATH 18 Inhaler 5   ALPRAZolam (XANAX) 0.25 MG tablet  Take by mouth.     amLODipine (NORVASC) 5 MG tablet Take 1 tablet by mouth daily.     apixaban (ELIQUIS) 5 MG TABS tablet Take 1 tablet (5 mg total) by mouth 2 (two) times daily. 60 tablet 2   ascorbic acid (VITAMIN C) 500 MG tablet Take 2,000 mg by mouth daily.      aspirin 81 MG tablet Take 1 tablet (81 mg total) by mouth daily. 30 tablet 2   buPROPion (WELLBUTRIN XL) 300 MG 24 hr tablet TAKE 1 TABLET BY MOUTH DAILY 90 tablet 0   chlorthalidone (HYGROTON) 25 MG tablet Take 0.5 tablets by mouth daily.     Cholecalciferol (VITAMIN D) 2000 units tablet Take 2,000 Units by mouth daily.  CRANBERRY PO Take 1 tablet by mouth daily.     Cyanocobalamin (B-12) 500 MCG TABS Take 500 mcg by mouth daily.      dextromethorphan-guaiFENesin (MUCINEX DM) 30-600 MG 12hr tablet Take 1 tablet by mouth 2 (two) times daily as needed for cough.     diazepam (VALIUM) 5 MG tablet Take 1 tablet (5 mg total) by mouth every 12 (twelve) hours as needed for anxiety. 20 tablet 0   ELDERBERRY PO Take by mouth daily. Unsure dose / combination vitamin     EPINEPHrine (EPIPEN 2-PAK) 0.3 mg/0.3 mL IJ SOAJ injection INJECT AS DIRECTED FOR SEVERE ALLERGIC REACTION (Patient taking differently: Inject 0.3 mg into the muscle as needed (for severe allergic reaction).) 1 Device 1   fluocinonide gel (LIDEX) 0.05 % Apply 1 application topically as needed.     furosemide (LASIX) 40 MG tablet TAKE ONE TABLET EVERY OTHER DAY 30 tablet 3   isosorbide mononitrate (IMDUR) 30 MG 24 hr tablet Take 30 mg by mouth daily.     levothyroxine (SYNTHROID) 100 MCG tablet Take 1 tablet (100 mcg total) by mouth daily. 90 tablet 1   menthol-cetylpyridinium (CEPACOL) 3 MG lozenge Take 1 lozenge (3 mg total) by mouth as needed for sore throat.     methocarbamol (ROBAXIN) 500 MG tablet Take 1 tablet (500 mg total) by mouth every 8 (eight) hours as needed for muscle spasms. 20 tablet 0   metoprolol tartrate (LOPRESSOR) 25 MG tablet One and one half tabs  twice daily (Patient taking differently: Take 25-50 mg by mouth 2 (two) times daily. 50 mg qam and 25 mg qpm) 90 tablet 0   mometasone (ELOCON) 0.1 % cream Apply 1 application topically daily as needed (Rash). 45 g 2   Multiple Vitamin (MULTI-VITAMINS) TABS Take 1 tablet by mouth daily.     mupirocin ointment (BACTROBAN) 2 % Apply 1 Application topically daily. With dressing changes 22 g 0   oxymetazoline (AFRIN) 0.05 % nasal spray Place 1 spray into both nostrils as needed for congestion.     Pyridoxine HCl (VITAMIN B-6 PO) Take 1 tablet by mouth daily.     rosuvastatin (CRESTOR) 20 MG tablet TAKE ONE TABLET (20 MG) BY MOUTH EVERY EVENING 90 tablet 3   sertraline (ZOLOFT) 25 MG tablet TAKE 1 TABLET BY MOUTH DAILY. SCHEDULE APPT FOR MORE REFILLS 90 tablet 1   sulfamethoxazole-trimethoprim (BACTRIM DS) 800-160 MG tablet Take 1 tablet by mouth 2 (two) times daily. 20 tablet 0   tacrolimus (PROTOPIC) 0.1 % ointment As needed for rosacea flare ups     tacrolimus (PROTOPIC) 0.1 % ointment Apply qd to bid qd prn to lips for dryness 60 g 1   zinc gluconate 50 MG tablet Take 50 mg by mouth daily.      zolpidem (AMBIEN) 5 MG tablet Take 1 tablet (5 mg total) by mouth at bedtime as needed. for sleep 30 tablet 0   No current facility-administered medications on file prior to visit.    There are no Patient Instructions on file for this visit. No follow-ups on file.   Georgiana Spinner, NP

## 2023-09-03 ENCOUNTER — Ambulatory Visit (INDEPENDENT_AMBULATORY_CARE_PROVIDER_SITE_OTHER): Admitting: Nurse Practitioner

## 2023-09-03 ENCOUNTER — Encounter (INDEPENDENT_AMBULATORY_CARE_PROVIDER_SITE_OTHER): Payer: Self-pay

## 2023-09-03 VITALS — BP 120/77 | HR 83 | Resp 16

## 2023-09-03 DIAGNOSIS — I890 Lymphedema, not elsewhere classified: Secondary | ICD-10-CM | POA: Diagnosis not present

## 2023-09-03 NOTE — Progress Notes (Signed)
 History of Present Illness  There is no documented history at this time  Assessments & Plan   There are no diagnoses linked to this encounter.    Additional instructions  Subjective:  Patient presents with venous ulcer of the Bilateral lower extremity.    Procedure:  3 layer unna wrap was placed Bilateral lower extremity.   Plan:   Follow up in one week.

## 2023-09-09 ENCOUNTER — Encounter (INDEPENDENT_AMBULATORY_CARE_PROVIDER_SITE_OTHER): Payer: Self-pay | Admitting: Nurse Practitioner

## 2023-09-09 ENCOUNTER — Ambulatory Visit (INDEPENDENT_AMBULATORY_CARE_PROVIDER_SITE_OTHER): Admitting: Nurse Practitioner

## 2023-09-09 VITALS — BP 122/75 | HR 62 | Resp 18

## 2023-09-09 DIAGNOSIS — I890 Lymphedema, not elsewhere classified: Secondary | ICD-10-CM

## 2023-09-24 ENCOUNTER — Ambulatory Visit (INDEPENDENT_AMBULATORY_CARE_PROVIDER_SITE_OTHER): Admitting: Nurse Practitioner

## 2023-09-24 ENCOUNTER — Encounter (INDEPENDENT_AMBULATORY_CARE_PROVIDER_SITE_OTHER): Payer: Self-pay

## 2023-09-24 VITALS — BP 138/82 | HR 80 | Resp 16 | Wt 193.6 lb

## 2023-09-24 DIAGNOSIS — I890 Lymphedema, not elsewhere classified: Secondary | ICD-10-CM

## 2023-09-24 NOTE — Progress Notes (Unsigned)
 History of Present Illness  There is no documented history at this time  Assessments & Plan   There are no diagnoses linked to this encounter.    Additional instructions  Subjective:  Patient presents with venous ulcer of the Bilateral lower extremity.    Procedure:  3 layer unna wrap was placed Bilateral lower extremity.   Plan:   Follow up in one week.

## 2023-09-27 ENCOUNTER — Encounter (INDEPENDENT_AMBULATORY_CARE_PROVIDER_SITE_OTHER): Payer: Self-pay | Admitting: Nurse Practitioner

## 2023-10-01 ENCOUNTER — Encounter (INDEPENDENT_AMBULATORY_CARE_PROVIDER_SITE_OTHER): Payer: Self-pay

## 2023-10-01 ENCOUNTER — Ambulatory Visit (INDEPENDENT_AMBULATORY_CARE_PROVIDER_SITE_OTHER): Admitting: Nurse Practitioner

## 2023-10-01 VITALS — BP 119/75 | HR 76 | Resp 16

## 2023-10-01 DIAGNOSIS — I890 Lymphedema, not elsewhere classified: Secondary | ICD-10-CM

## 2023-10-01 NOTE — Progress Notes (Unsigned)
 Patient was seen today for weekly bilateral unna wraps. Patient will come out of wraps today and will keep follow up appointment as scheduled.

## 2023-10-02 ENCOUNTER — Encounter (INDEPENDENT_AMBULATORY_CARE_PROVIDER_SITE_OTHER): Payer: Self-pay | Admitting: Nurse Practitioner

## 2023-10-07 ENCOUNTER — Encounter (INDEPENDENT_AMBULATORY_CARE_PROVIDER_SITE_OTHER)

## 2023-10-07 ENCOUNTER — Ambulatory Visit (INDEPENDENT_AMBULATORY_CARE_PROVIDER_SITE_OTHER): Admitting: Nurse Practitioner

## 2023-10-07 ENCOUNTER — Ambulatory Visit: Payer: Medicare HMO | Admitting: Dermatology

## 2023-11-04 ENCOUNTER — Ambulatory Visit (INDEPENDENT_AMBULATORY_CARE_PROVIDER_SITE_OTHER): Admitting: Nurse Practitioner

## 2023-11-04 ENCOUNTER — Encounter (INDEPENDENT_AMBULATORY_CARE_PROVIDER_SITE_OTHER)

## 2023-12-01 ENCOUNTER — Other Ambulatory Visit (INDEPENDENT_AMBULATORY_CARE_PROVIDER_SITE_OTHER): Payer: Self-pay | Admitting: Nurse Practitioner

## 2023-12-01 DIAGNOSIS — I6529 Occlusion and stenosis of unspecified carotid artery: Secondary | ICD-10-CM

## 2023-12-03 ENCOUNTER — Ambulatory Visit (INDEPENDENT_AMBULATORY_CARE_PROVIDER_SITE_OTHER): Admitting: Nurse Practitioner

## 2023-12-03 ENCOUNTER — Encounter (INDEPENDENT_AMBULATORY_CARE_PROVIDER_SITE_OTHER)

## 2024-01-04 DIAGNOSIS — C4A9 Merkel cell carcinoma, unspecified: Secondary | ICD-10-CM | POA: Diagnosis not present

## 2024-01-13 ENCOUNTER — Other Ambulatory Visit (INDEPENDENT_AMBULATORY_CARE_PROVIDER_SITE_OTHER)

## 2024-01-13 ENCOUNTER — Ambulatory Visit (INDEPENDENT_AMBULATORY_CARE_PROVIDER_SITE_OTHER): Admitting: Nurse Practitioner

## 2024-01-13 ENCOUNTER — Encounter (INDEPENDENT_AMBULATORY_CARE_PROVIDER_SITE_OTHER): Payer: Self-pay | Admitting: Nurse Practitioner

## 2024-01-13 VITALS — BP 121/77 | HR 88 | Ht 63.0 in | Wt 199.4 lb

## 2024-01-13 DIAGNOSIS — I890 Lymphedema, not elsewhere classified: Secondary | ICD-10-CM

## 2024-01-13 DIAGNOSIS — I739 Peripheral vascular disease, unspecified: Secondary | ICD-10-CM | POA: Diagnosis not present

## 2024-01-13 DIAGNOSIS — I25810 Atherosclerosis of coronary artery bypass graft(s) without angina pectoris: Secondary | ICD-10-CM | POA: Diagnosis not present

## 2024-01-13 DIAGNOSIS — I6529 Occlusion and stenosis of unspecified carotid artery: Secondary | ICD-10-CM | POA: Diagnosis not present

## 2024-01-13 DIAGNOSIS — I10 Essential (primary) hypertension: Secondary | ICD-10-CM | POA: Diagnosis not present

## 2024-01-17 ENCOUNTER — Encounter (INDEPENDENT_AMBULATORY_CARE_PROVIDER_SITE_OTHER): Payer: Self-pay | Admitting: Nurse Practitioner

## 2024-01-17 NOTE — Progress Notes (Signed)
 Subjective:    Patient ID: Stacey [name], female    DOB: [birth year], 83 y.o.   MRN: [id number] Chief Complaint  Patient presents with   Follow-up    - fu Bil Carotid Duplex see FB     The patient is seen for follow up evaluation of carotid stenosis. The carotid stenosis followed by ultrasound.   The patient denies amaurosis fugax. There is no recent history of TIA symptoms or focal motor deficits. There is a prior documented CVA.  The patient is taking enteric-coated aspirin  81 mg daily.  There is no history of migraine headaches. There is no history of seizures.  The patient has recently started to describe claudication-like symptoms.  No history of rest pain symptoms. No new ulcers or wounds of the lower extremities have occurred.  She also continues to have issues with swelling but this is improved from her previous visit.  There is no history of DVT, PE or superficial thrombophlebitis. No documented recent episodes of angina or shortness of breath documented.   Carotid Duplex done today shows 1 to 39% bilaterally.    Review of Systems  Cardiovascular:  Positive for leg swelling.  Musculoskeletal:  Positive for arthralgias.  Neurological:  Positive for weakness.  All other systems reviewed and are negative.      Objective:   Physical Exam Vitals reviewed.  HENT:     Head: Normocephalic.  Cardiovascular:     Rate and Rhythm: Normal rate.  Pulmonary:     Effort: Pulmonary effort is normal.  Skin:    General: Skin is warm and dry.  Neurological:     Mental Status: She is alert and oriented to person, place, and time.  Psychiatric:        Mood and Affect: Mood normal.        Behavior: Behavior normal.        Thought Content: Thought content normal.        Judgment: Judgment normal.     BP 121/77   Pulse 88   Ht 5' 3 (1.6 m)   Wt 199 lb 6.4 oz (90.4 kg)   BMI 35.32 kg/m   Past Medical History:  Diagnosis Date   Abnormal nuclear stress test  05/24/2017   Actinic keratosis    Anxiety    a.) on BZO (diazepam ) PRN   Aortic atherosclerosis (HCC)    Arthritis    Asthma    Atrial fibrillation, transient (HCC)    a.) CHA2DS2VASc = 7 (age x 2, sex, HTN, CVA x 2, prior MI);  b.) rate/rhythm maintained on oral metoprolol  tartrate; chronically anticoagulated with apixaban    Benign neoplasm of ascending colon    Benign neoplasm of descending colon    CAD (coronary artery disease)    a.) LHC at [facility] 02/20/2017: 80% oRI-RI, 90% o-pLAD, 95% mLAD, 99% p-mRCA --> urgent CVTS consult; b.) 3v CABG at [facility]; c.) LHC 05/24/2017: 100% o-pLAD, 80% oRI-RI, 100% p-mRCA, SVG-RCA patent, SVG-oD1 with min luminal irregs, LIMA-LAD patent -- med mgmt.   Chronic lower back pain    CKD (chronic kidney disease) stage 3, GFR 30-59 ml/min (HCC)    Complication of anesthesia    DDD (degenerative disc disease), cervical    Depression    Dyspnea    Family history of adverse reaction to anesthesia    most of family - PONV   Fibromyalgia    GERD (gastroesophageal reflux disease)    Glaucoma    History of 2019 novel coronavirus  disease (COVID-19) 02/02/2019   History of bilateral cataract extraction    HTN (hypertension) 03/09/2007   Hyperlipidemia LDL goal <70 03/25/1993   Hypertension    Hypothyroidism    Insomnia    a.) on hypnotic (zolpidem ) PRN   Long term current use of anticoagulant    a.) apixaban    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   L calf, WLE at [facility]   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/2023   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 WD SCC with superficial infiltration., MOHs 10/02/19   Stroke (HCC) 10/22/2017   a.) CT/MRI 10/22/2017 - RIGHT occipitoparietal infarct   Vitamin D  deficiency     Social History   Socioeconomic History   Marital status: Married    Spouse name: Stacey [name]   Number of children: 3   Years of education: Not on file   Highest education level: GED or equivalent  Occupational History    Employer: [employer]  Tobacco Use   Smoking status: Former    Current packs/day: 0.00    Types: Cigarettes    Quit date: 05/26/1975    Years since quitting: 48.6   Smokeless tobacco: Never   Tobacco comments:    quit 45  years ago   Vaping Use   Vaping status: Never Used  Substance and Sexual Activity   Alcohol use: No   Drug use: No   Sexual activity: Not Currently  Other Topics Concern   Not on file  Social History Narrative   Not on file   Social Drivers of Health   Financial Resource Strain: Low Risk  (08/14/2021)   Overall Financial Resource Strain (CARDIA)    Difficulty of Paying Living Expenses: Not hard at all  Food Insecurity: No Food Insecurity (01/06/2024)   Received from [facility]   Hunger Vital Sign    Within the past 12 months, you worried that your food would run out before you got the money to buy more.: Never true    Within the past 12 months, the food you bought just didn't last and you didn't have money to get more.: Never true  Transportation Needs: No Transportation Needs (01/06/2024)   Received from [facility]   PRAPARE - Transportation    Lack of Transportation (Medical): No    Lack of Transportation (Non-Medical): No  Physical Activity: Insufficiently Active (08/14/2021)   Exercise Vital Sign    Days of Exercise per Week: 2 days    Minutes of Exercise per Session: 20 min  Stress: No Stress Concern Present (08/14/2021)    [employer] of Occupational Health - Occupational Stress Questionnaire    Feeling of Stress : Only a little  Social Connections: Unknown (10/07/2021)   Received from [facility]   Social Network    Social Network: Not on file  Intimate Partner Violence:  Patient Unable To Answer (01/06/2024)   Received from [facility]   Humiliation, Afraid, Rape, and Kick questionnaire    Within the last year, have you been afraid of your partner or ex-partner?: Patient unable to answer    Within the last year, have you been humiliated or emotionally abused in other ways by your partner or ex-partner?: Patient unable to answer    Within the last year, have you been kicked, hit, slapped, or otherwise physically hurt by your partner or ex-partner?: Patient unable to answer    Within the last year, have you been raped or forced to have any kind of sexual activity by your partner or ex-partner?: Patient unable to answer    Past Surgical History:  Procedure Laterality Date   ABDOMINAL HYSTERECTOMY     APPENDECTOMY     BLADDER SURGERY     CATARACT EXTRACTION W/ INTRAOCULAR LENS  IMPLANT, BILATERAL     COLONOSCOPY WITH PROPOFOL  N/A 01/17/2016   Procedure: COLONOSCOPY WITH PROPOFOL ;  Surgeon: Rogelia Copping, MD;  Location: [facility];  Service: Endoscopy;  Laterality: N/A;   COLONOSCOPY WITH PROPOFOL  N/A 03/02/2023   Procedure: COLONOSCOPY WITH PROPOFOL ;  Surgeon: Maryruth Ole DASEN, MD;  Location: [facility] END[facility];  Service: Endoscopy;  Laterality: N/A;  PROBABLY CAN NOT BE EARLIER THAN 11:45 AM ARRIVAL PER HUSBAND DUE TO TRANSPORTATION   CORONARY ARTERY BYPASS GRAFT N/A 02/20/2017   Procedure: CORONARY ARTERY BYPASS GRAFTING (CABG) x , three using left internal mammary artery to left anterior descending coronary artery and right greater saphenous vein harvested endoscopically to distal right and diagonal coronary arteries.;  Surgeon: Army Dallas NOVAK, MD;  Location: [facility] OR;  Service: Open Heart  Surgery;  Laterality: N/A;   LEFT HEART CATH AND CORONARY ANGIOGRAPHY N/A 02/20/2017   Procedure: LEFT HEART CATH AND CORONARY ANGIOGRAPHY;  Surgeon: Ammon Blunt, MD;  Location: [facility] INVASIVE CV LAB;  Service: Cardiovascular;  Laterality: N/A;   LEFT HEART CATH AND CORS/GRAFTS ANGIOGRAPHY N/A 05/24/2017   Procedure: LEFT HEART CATH AND CORS/GRAFTS ANGIOGRAPHY;  Surgeon: Wonda Sharper, MD;  Location: [facility] INVASIVE CV LAB;  Service: Cardiovascular;  Laterality: N/A;   NASAL SINUS SURGERY     POLYPECTOMY  01/17/2016   Procedure: POLYPECTOMY;  Surgeon: Rogelia Copping, MD;  Location: [facility];  Service: Endoscopy;;   POLYPECTOMY  03/02/2023   Procedure: POLYPECTOMY;  Surgeon: Maryruth Ole DASEN, MD;  Location: [facility] ENDOSCOPY;  Service: Endoscopy;;   RIGHT OOPHORECTOMY     TEE WITHOUT CARDIOVERSION N/A 02/20/2017   Procedure: TRANSESOPHAGEAL ECHOCARDIOGRAM (TEE);  Surgeon: Army Dallas NOVAK, MD;  Location: [facility] OR;  Service: Open Heart Surgery;  Laterality: N/A;   TOTAL SHOULDER ARTHROPLASTY Right 03/26/2022   Procedure: TOTAL SHOULDER ARTHROPLASTY- reverse;  Surgeon: Rollene Cough, MD;  Location: [facility] ORS;  Service: Orthopedics;  Laterality: Right;    Family History  Problem Relation Age of Onset   Alzheimer's disease Mother    Kidney cancer Mother    Heart attack Father    Pancreatic cancer Sister    Heart attack Brother 84   Ovarian cancer Sister    Kidney disease Sister        dialysis   Cancer Sister        uterin   Diabetes Sister    Heart attack Sister    Heart attack Brother 51   Ovarian cancer Sister     Allergies  Allergen Reactions   Cephalexin Anaphylaxis   Atorvastatin     myalgia  Morphine  And Codeine Hives and Nausea And Vomiting   Other Hives and Other (See Comments)    Muscle pain   Procaine Other (See Comments)    Chest pain Chest pain   Shellfish Allergy Swelling    angioedema   Statins Other (See Comments)    Muscle pain   Tomato Other  (See Comments)    (by testing) - also beans, wheat, peas (by testing) - also beans, wheat, peas   Penicillins Hives and Rash    Has patient had a PCN reaction causing immediate rash, facial/tongue/throat swelling, SOB or lightheadedness with hypotension: Yes Has patient had a PCN reaction causing severe rash involving mucus membranes or skin necrosis: No Has patient had a PCN reaction that required hospitalization: No Has patient had a PCN reaction occurring within the last 10 years: Yes If all of the above answers are NO, then may proceed with Cephalosporin use.       Latest Ref Rng & Units 05/22/2022   11:02 AM 05/15/2022    1:52 PM 03/27/2022    4:43 AM  CBC  WBC 3.4 - 10.8 x10E3/uL 7.7  6.1  12.2   Hemoglobin 11.1 - 15.9 g/dL 86.5  87.2  87.6   Hematocrit 34.0 - 46.6 % 40.4  38.4  36.7   Platelets 150 - 450 x10E3/uL 180  169  164       CMP     Component Value Date/Time   NA 141 05/22/2022 1102   K 3.4 (L) 05/22/2022 1102   CL 94 (L) 05/22/2022 1102   CO2 28 05/22/2022 1102   GLUCOSE 128 (H) 05/22/2022 1102   GLUCOSE 170 (H) 03/27/2022 0443   BUN 23 05/22/2022 1102   CREATININE 1.06 (H) 05/22/2022 1102   CREATININE 0.93 04/29/2017 0801   CALCIUM  9.2 05/22/2022 1102   PROT 5.8 (L) 05/22/2022 1102   ALBUMIN  3.9 05/22/2022 1102   AST 17 05/22/2022 1102   ALT 11 05/22/2022 1102   ALKPHOS 88 05/22/2022 1102   BILITOT 0.4 05/22/2022 1102   EGFR 53 (L) 05/22/2022 1102   GFRNONAA >60 03/27/2022 0443   GFRNONAA 61 04/29/2017 0801     No results found.     Assessment & Plan:   1. Occlusion and stenosis of unspecified carotid artery (Primary) Recommend:  Given the patient's asymptomatic subcritical stenosis no further invasive testing or surgery at this time.  Duplex ultrasound shows <40% stenosis bilaterally.  Continue antiplatelet therapy as prescribed Continue management of CAD, HTN and Hyperlipidemia Healthy heart diet,  encouraged exercise at least 4  times per week  Follow up in 12 months with duplex ultrasound and physical exam   2. Lymphedema Patient is advised to continue with conservative therapies including use of her medical grade compression socks.  She is also advised to elevate her lower extremities and continue with use of her lymphedema pump daily to help control her symptoms.    3. Essential hypertension Continue antihypertensive medications as already ordered, these medications have been reviewed and there are no changes at this time.  4. Claudication [facility]) The patient has symptoms consistent with claudication.  She has had previous scans which indicate significant atherosclerotic aortic disease.  Based on her symptoms it would be prudent to have her return for an aortoiliac duplex as well as ABIs to determine if her lower extremity symptoms are related to decreased perfusion.   Current Outpatient Medications on File Prior to Visit  Medication Sig Dispense Refill   acyclovir  (ZOVIRAX )  400 MG tablet TAKE ONE TABLET BY MOUTH 3 TIMES DAILY FOR 5 DAYS TO START IMMEDIATELY WITH EACH EPISODE OF FEVER BLISTER.MAY ALSO TAKE ONE DAILY FOR PREVENTION 30 tablet 11   albuterol  (VENTOLIN  HFA) 108 (90 Base) MCG/ACT inhaler INHALE 2 PUFFS BY MOUTH EVERY 4 TO 6 HOURS AS NEEDED FOR SHORTNESS OF BREATH 18 Inhaler 5   ALPRAZolam  (XANAX ) 0.25 MG tablet Take by mouth.     amLODipine  (NORVASC ) 5 MG tablet Take 1 tablet by mouth daily.     apixaban  (ELIQUIS ) 5 MG TABS tablet Take 1 tablet (5 mg total) by mouth 2 (two) times daily. 60 tablet 2   ascorbic acid  (VITAMIN C ) 500 MG tablet Take 2,000 mg by mouth daily.      aspirin  81 MG tablet Take 1 tablet (81 mg total) by mouth daily. 30 tablet 2   buPROPion  (WELLBUTRIN  XL) 300 MG 24 hr tablet TAKE 1 TABLET BY MOUTH DAILY 90 tablet 0   chlorthalidone  (HYGROTON ) 25 MG tablet Take 0.5 tablets by mouth daily.     Cholecalciferol  (VITAMIN D ) 2000 units tablet Take 2,000 Units by mouth daily.      diazepam  (VALIUM ) 5 MG tablet Take 1 tablet (5 mg total) by mouth every 12 (twelve) hours as needed for anxiety. 20 tablet 0   fluocinonide gel (LIDEX) 0.05 % Apply 1 application topically as needed.     furosemide  (LASIX ) 40 MG tablet TAKE ONE TABLET EVERY OTHER DAY 30 tablet 3   isosorbide mononitrate (IMDUR) 30 MG 24 hr tablet Take 30 mg by mouth daily.     levothyroxine  (SYNTHROID ) 100 MCG tablet Take 1 tablet (100 mcg total) by mouth daily. 90 tablet 1   methocarbamol  (ROBAXIN ) 500 MG tablet Take 1 tablet (500 mg total) by mouth every 8 (eight) hours as needed for muscle spasms. 20 tablet 0   metoprolol  tartrate (LOPRESSOR ) 25 MG tablet One and one half tabs twice daily 90 tablet 0   mometasone  (ELOCON ) 0.1 % cream Apply 1 application topically daily as needed (Rash). 45 g 2   oxymetazoline  (AFRIN) 0.05 % nasal spray Place 1 spray into both nostrils as needed for congestion.     Pyridoxine HCl (VITAMIN B-6 PO) Take 1 tablet by mouth daily.     rosuvastatin  (CRESTOR ) 20 MG tablet TAKE ONE TABLET (20 MG) BY MOUTH EVERY EVENING 90 tablet 3   sertraline  (ZOLOFT ) 25 MG tablet TAKE 1 TABLET BY MOUTH DAILY. SCHEDULE APPT FOR MORE REFILLS 90 tablet 1   tacrolimus  (PROTOPIC ) 0.1 % ointment Apply qd to bid qd prn to lips for dryness 60 g 1   CRANBERRY PO Take 1 tablet by mouth daily.     Cyanocobalamin  (B-12) 500 MCG TABS Take 500 mcg by mouth daily.      dextromethorphan-guaiFENesin  (MUCINEX  DM) 30-600 MG 12hr tablet Take 1 tablet by mouth 2 (two) times daily as needed for cough. (Patient not taking: Reported on 01/13/2024)     ELDERBERRY PO Take by mouth daily. Unsure dose / combination vitamin     EPINEPHrine  (EPIPEN  2-PAK) 0.3 mg/0.3 mL IJ SOAJ injection INJECT AS DIRECTED FOR SEVERE ALLERGIC REACTION (Patient taking differently: Inject 0.3 mg into the muscle as needed (for severe allergic reaction).) 1 Device 1   menthol -cetylpyridinium (CEPACOL) 3 MG lozenge Take 1 lozenge (3 mg total) by mouth as  needed for sore throat.     Multiple Vitamin (MULTI-VITAMINS) TABS Take 1 tablet by mouth daily.     mupirocin  ointment (  BACTROBAN ) 2 % Apply 1 Application topically daily. With dressing changes 22 g 0   sulfamethoxazole -trimethoprim  (BACTRIM  DS) 800-160 MG tablet Take 1 tablet by mouth 2 (two) times daily. (Patient not taking: Reported on 10/01/2023) 20 tablet 0   tacrolimus  (PROTOPIC ) 0.1 % ointment As needed for rosacea flare ups     zinc gluconate 50 MG tablet Take 50 mg by mouth daily.      zolpidem  (AMBIEN ) 5 MG tablet Take 1 tablet (5 mg total) by mouth at bedtime as needed. for sleep 30 tablet 0   No current facility-administered medications on file prior to visit.    There are no Patient Instructions on file for this visit. No follow-ups on file.   Maryan Sivak E Jera Headings, NP

## 2024-01-27 DIAGNOSIS — E039 Hypothyroidism, unspecified: Secondary | ICD-10-CM | POA: Diagnosis not present

## 2024-01-27 DIAGNOSIS — Z951 Presence of aortocoronary bypass graft: Secondary | ICD-10-CM | POA: Diagnosis not present

## 2024-01-27 DIAGNOSIS — C4A72 Merkel cell carcinoma of left lower limb, including hip: Secondary | ICD-10-CM | POA: Diagnosis not present

## 2024-02-16 DIAGNOSIS — F32A Depression, unspecified: Secondary | ICD-10-CM | POA: Diagnosis not present

## 2024-02-16 DIAGNOSIS — Z951 Presence of aortocoronary bypass graft: Secondary | ICD-10-CM | POA: Diagnosis not present

## 2024-02-16 DIAGNOSIS — I10 Essential (primary) hypertension: Secondary | ICD-10-CM | POA: Diagnosis not present

## 2024-02-16 DIAGNOSIS — Z87891 Personal history of nicotine dependence: Secondary | ICD-10-CM | POA: Diagnosis not present

## 2024-02-16 DIAGNOSIS — S3210XD Unspecified fracture of sacrum, subsequent encounter for fracture with routine healing: Secondary | ICD-10-CM | POA: Diagnosis not present

## 2024-02-16 DIAGNOSIS — M1611 Unilateral primary osteoarthritis, right hip: Secondary | ICD-10-CM | POA: Diagnosis not present

## 2024-02-16 DIAGNOSIS — E785 Hyperlipidemia, unspecified: Secondary | ICD-10-CM | POA: Diagnosis not present

## 2024-02-16 DIAGNOSIS — E039 Hypothyroidism, unspecified: Secondary | ICD-10-CM | POA: Diagnosis not present

## 2024-02-16 DIAGNOSIS — Z5112 Encounter for antineoplastic immunotherapy: Secondary | ICD-10-CM | POA: Diagnosis not present

## 2024-02-16 DIAGNOSIS — X58XXXD Exposure to other specified factors, subsequent encounter: Secondary | ICD-10-CM | POA: Diagnosis not present

## 2024-02-16 DIAGNOSIS — I4891 Unspecified atrial fibrillation: Secondary | ICD-10-CM | POA: Diagnosis not present

## 2024-02-16 DIAGNOSIS — Z79899 Other long term (current) drug therapy: Secondary | ICD-10-CM | POA: Diagnosis not present

## 2024-02-16 DIAGNOSIS — C4A72 Merkel cell carcinoma of left lower limb, including hip: Secondary | ICD-10-CM | POA: Diagnosis not present

## 2024-02-16 DIAGNOSIS — R21 Rash and other nonspecific skin eruption: Secondary | ICD-10-CM | POA: Diagnosis not present

## 2024-02-16 DIAGNOSIS — Z96611 Presence of right artificial shoulder joint: Secondary | ICD-10-CM | POA: Diagnosis not present

## 2024-02-16 DIAGNOSIS — Z7982 Long term (current) use of aspirin: Secondary | ICD-10-CM | POA: Diagnosis not present

## 2024-02-16 DIAGNOSIS — I2510 Atherosclerotic heart disease of native coronary artery without angina pectoris: Secondary | ICD-10-CM | POA: Diagnosis not present

## 2024-02-23 ENCOUNTER — Other Ambulatory Visit (INDEPENDENT_AMBULATORY_CARE_PROVIDER_SITE_OTHER): Payer: Self-pay | Admitting: Nurse Practitioner

## 2024-02-23 DIAGNOSIS — I739 Peripheral vascular disease, unspecified: Secondary | ICD-10-CM

## 2024-02-24 ENCOUNTER — Ambulatory Visit (INDEPENDENT_AMBULATORY_CARE_PROVIDER_SITE_OTHER): Admitting: Nurse Practitioner

## 2024-02-24 ENCOUNTER — Ambulatory Visit (INDEPENDENT_AMBULATORY_CARE_PROVIDER_SITE_OTHER)

## 2024-02-24 ENCOUNTER — Other Ambulatory Visit (INDEPENDENT_AMBULATORY_CARE_PROVIDER_SITE_OTHER)

## 2024-02-24 ENCOUNTER — Encounter (INDEPENDENT_AMBULATORY_CARE_PROVIDER_SITE_OTHER): Payer: Self-pay | Admitting: Nurse Practitioner

## 2024-02-24 VITALS — BP 117/73 | HR 80 | Ht 63.0 in | Wt 201.6 lb

## 2024-02-24 DIAGNOSIS — I10 Essential (primary) hypertension: Secondary | ICD-10-CM | POA: Diagnosis not present

## 2024-02-24 DIAGNOSIS — I6529 Occlusion and stenosis of unspecified carotid artery: Secondary | ICD-10-CM

## 2024-02-24 DIAGNOSIS — I739 Peripheral vascular disease, unspecified: Secondary | ICD-10-CM | POA: Diagnosis not present

## 2024-02-24 DIAGNOSIS — I890 Lymphedema, not elsewhere classified: Secondary | ICD-10-CM

## 2024-02-24 NOTE — Progress Notes (Signed)
 Subjective:    Patient ID: Stacey [name], female    DOB: 08/19/1940, 83 y.o.   MRN: [id number] Chief Complaint  Patient presents with   Follow-up    Pt conv . Aorta/iliac / ABI    The patient returns today for follow-up evaluation of claudication-like symptoms.  She has no open wounds or ulcerations on her bilateral lower extremities.  She does note significant claudication-like symptoms with activity.  She does also have a history of lower back issues.  She notes that she saw [facility] for this but has not followed up recently.  She does note that her swelling seems to be worse.  She notes that she has difficulty with utilizing medical grade compression.  Today noninvasive venous show an ABI of 1.06 on the right and 1.03 on the left.  She has strong triphasic tibial waveforms and normal toe waveforms bilaterally.  In addition we also had the patient undergo an aortoiliac duplex which showed no evidence of significant iliac stenosis.  There is also no evidence of aortic aneurysm.     Review of Systems  Cardiovascular:  Positive for leg swelling.  Musculoskeletal:  Positive for arthralgias, back pain and gait problem.  Neurological:  Positive for weakness.  All other systems reviewed and are negative.      Objective:   Physical Exam Vitals reviewed.  HENT:     Head: Normocephalic.  Cardiovascular:     Rate and Rhythm: Normal rate.     Pulses:          Dorsalis pedis pulses are detected w/ Doppler on the right side and detected w/ Doppler on the left side.       Posterior tibial pulses are detected w/ Doppler on the right side and detected w/ Doppler on the left side.  Pulmonary:     Effort: Pulmonary effort is normal.  Musculoskeletal:     Right lower leg: 2+ Edema present.     Left lower leg: 2+ Edema present.  Skin:    General: Skin is warm and dry.  Neurological:     Mental Status: She is alert and oriented to person, place, and time.  Psychiatric:        Mood and  Affect: Mood normal.        Behavior: Behavior normal.        Thought Content: Thought content normal.        Judgment: Judgment normal.     BP 117/73   Pulse 80   Ht 5' 3 (1.6 m)   Wt 201 lb 9.6 oz (91.4 kg)   BMI 35.71 kg/m   Past Medical History:  Diagnosis Date   Abnormal nuclear stress test 05/24/2017   Actinic keratosis    Anxiety    a.) on BZO (diazepam ) PRN   Aortic atherosclerosis    Arthritis    Asthma    Atrial fibrillation, transient (HCC)    a.) CHA2DS2VASc = 7 (age x 2, sex, HTN, CVA x 2, prior MI);  b.) rate/rhythm maintained on oral metoprolol  tartrate; chronically anticoagulated with apixaban    Benign neoplasm of ascending colon    Benign neoplasm of descending colon    CAD (coronary artery disease)    a.) LHC at [facility] 02/20/2017: 80% oRI-RI, 90% o-pLAD, 95% mLAD, 99% p-mRCA --> urgent CVTS consult; b.) 3v CABG at [facility]; c.) LHC 05/24/2017: 100% o-pLAD, 80% oRI-RI, 100% p-mRCA, SVG-RCA patent, SVG-oD1 with min luminal irregs, LIMA-LAD patent -- med mgmt.   Chronic  lower back pain    CKD (chronic kidney disease) stage 3, GFR 30-59 ml/min (HCC)    Complication of anesthesia    DDD (degenerative disc disease), cervical    Depression    Dyspnea    Family history of adverse reaction to anesthesia    most of family - PONV   Fibromyalgia    GERD (gastroesophageal reflux disease)    Glaucoma    History of 2019 novel coronavirus disease (COVID-19) 02/02/2019   History of bilateral cataract extraction    HTN (hypertension) 03/09/2007   Hyperlipidemia LDL goal <70 03/25/1993   Hypertension    Hypothyroidism    Insomnia    a.) on hypnotic (zolpidem ) PRN   Long term current use of anticoagulant    a.) apixaban    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   L calf, WLE at [facility]   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/2023   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  Will notify [facility] oncology where she is being followed for this diagnosis   Migraines     a.) 1-2x/mo   Motion sickness    all moving vehicles   NSTEMI (non-ST elevated myocardial infarction) (HCC) 02/20/2017   a.) LHC at [facility] 02/20/2017: 80% oRI-RI, 90% o-pLAD, 95% mLAD, 99% p-mRCA --> urgent CVTS consult; b.) 3v CABG at [facility]   OSA on CPAP    PONV (postoperative nausea and vomiting)    S/P CABG x 3    a.) LIMA-LAD, SVG-dRCA, SVG-D1   Squamous cell carcinoma of skin 07/25/2019   Left upper lateral lip above vermillion border. WD SCC with superficial infiltration., MOHs 10/02/19   Stroke (HCC) 10/22/2017   a.) CT/MRI 10/22/2017 - RIGHT occipitoparietal infarct   Vitamin D  deficiency     Social History   Socioeconomic History   Marital status: Married    Spouse name: Prentice   Number of children: 3   Years of education: Not on file   Highest education level: GED or equivalent  Occupational History    Employer: [employer]  Tobacco Use   Smoking status: Former    Current packs/day: 0.00    Types: Cigarettes    Quit date: 05/26/1975    Years since quitting: 48.7   Smokeless tobacco: Never   Tobacco comments:    quit 45  years ago   Vaping Use   Vaping status: Never Used  Substance and Sexual Activity   Alcohol use: No   Drug use: No   Sexual activity: Not Currently  Other Topics Concern   Not on file  Social History Narrative   Not on file   Social Drivers of Health   Financial Resource Strain: Low Risk  (08/14/2021)   Overall Financial Resource Strain (CARDIA)    Difficulty of Paying Living Expenses: Not hard at all  Food Insecurity: No Food Insecurity (01/06/2024)   Received from [facility]   Hunger Vital Sign    Within the past 12 months, you worried that your food would run out before you got the money to buy more.: Never true    Within the past 12 months, the food you bought just didn't last and you didn't have money to get more.: Never true  Transportation Needs: No Transportation Needs (01/06/2024)   Received from [facility]   PRAPARE -  Transportation    Lack of Transportation (Medical): No    Lack of Transportation (Non-Medical): No  Physical Activity: Insufficiently Active (08/14/2021)  Exercise Vital Sign    Days of Exercise per Week: 2 days    Minutes of Exercise per Session: 20 min  Stress: No Stress Concern Present (08/14/2021)   [employer] of Occupational Health - Occupational Stress Questionnaire    Feeling of Stress : Only a little  Social Connections: Unknown (10/07/2021)   Received from [facility]   Social Network    Social Network: Not on file  Intimate Partner Violence: Patient Unable To Answer (01/06/2024)   Received from [facility]   Humiliation, Afraid, Rape, and Kick questionnaire    Within the last year, have you been afraid of your partner or ex-partner?: Patient unable to answer    Within the last year, have you been humiliated or emotionally abused in other ways by your partner or ex-partner?: Patient unable to answer    Within the last year, have you been kicked, hit, slapped, or otherwise physically hurt by your partner or ex-partner?: Patient unable to answer    Within the last year, have you been raped or forced to have any kind of sexual activity by your partner or ex-partner?: Patient unable to answer    Past Surgical History:  Procedure Laterality Date   ABDOMINAL HYSTERECTOMY     APPENDECTOMY     BLADDER SURGERY     CATARACT EXTRACTION W/ INTRAOCULAR LENS  IMPLANT, BILATERAL     COLONOSCOPY WITH PROPOFOL  N/A 01/17/2016   Procedure: COLONOSCOPY WITH PROPOFOL ;  Surgeon: Rogelia Copping, MD;  Location: [facility];  Service: Endoscopy;  Laterality: N/A;   COLONOSCOPY WITH PROPOFOL  N/A 03/02/2023   Procedure: COLONOSCOPY WITH PROPOFOL ;  Surgeon: Maryruth Ole DASEN, MD;  Location: [facility];  Service: Endoscopy;  Laterality: N/A;  PROBABLY CAN NOT BE EARLIER THAN 11:45 AM ARRIVAL PER HUSBAND DUE TO TRANSPORTATION   CORONARY ARTERY BYPASS GRAFT N/A 02/20/2017   Procedure:  CORONARY ARTERY BYPASS GRAFTING (CABG) x , three using left internal mammary artery to left anterior descending coronary artery and right greater saphenous vein harvested endoscopically to distal right and diagonal coronary arteries.;  Surgeon: Army Dallas NOVAK, MD;  Location: [facility] OR;  Service: Open Heart Surgery;  Laterality: N/A;   LEFT HEART CATH AND CORONARY ANGIOGRAPHY N/A 02/20/2017   Procedure: LEFT HEART CATH AND CORONARY ANGIOGRAPHY;  Surgeon: Ammon Blunt, MD;  Location: [facility] INVASIVE CV LAB;  Service: Cardiovascular;  Laterality: N/A;   LEFT HEART CATH AND CORS/GRAFTS ANGIOGRAPHY N/A 05/24/2017   Procedure: LEFT HEART CATH AND CORS/GRAFTS ANGIOGRAPHY;  Surgeon: Wonda Sharper, MD;  Location: [facility] INVASIVE CV LAB;  Service: Cardiovascular;  Laterality: N/A;   NASAL SINUS SURGERY     POLYPECTOMY  01/17/2016   Procedure: POLYPECTOMY;  Surgeon: Rogelia Copping, MD;  Location: [facility];  Service: Endoscopy;;   POLYPECTOMY  03/02/2023   Procedure: POLYPECTOMY;  Surgeon: Maryruth Ole DASEN, MD;  Location: [facility] ENDOSCOPY;  Service: Endoscopy;;   RIGHT OOPHORECTOMY     TEE WITHOUT CARDIOVERSION N/A 02/20/2017   Procedure: TRANSESOPHAGEAL ECHOCARDIOGRAM (TEE);  Surgeon: Army Dallas NOVAK, MD;  Location: [facility] OR;  Service: Open Heart Surgery;  Laterality: N/A;   TOTAL SHOULDER ARTHROPLASTY Right 03/26/2022   Procedure: TOTAL SHOULDER ARTHROPLASTY- reverse;  Surgeon: Rollene Cough, MD;  Location: [facility] ORS;  Service: Orthopedics;  Laterality: Right;    Family History  Problem Relation Age of Onset   Alzheimer's disease Mother    Kidney cancer Mother    Heart attack Father    Pancreatic cancer Sister  Heart attack Brother 26   Ovarian cancer Sister    Kidney disease Sister        dialysis   Cancer Sister        uterin   Diabetes Sister    Heart attack Sister    Heart attack Brother 54   Ovarian cancer Sister     Allergies  Allergen Reactions   Cephalexin Anaphylaxis    Atorvastatin     myalgia   Morphine  And Codeine Hives and Nausea And Vomiting   Other Hives and Other (See Comments)    Muscle pain   Procaine Other (See Comments)    Chest pain Chest pain   Shellfish Allergy Swelling    angioedema   Statins Other (See Comments)    Muscle pain   Tomato Other (See Comments)    (by testing) - also beans, wheat, peas (by testing) - also beans, wheat, peas   Penicillins Hives and Rash    Has patient had a PCN reaction causing immediate rash, facial/tongue/throat swelling, SOB or lightheadedness with hypotension: Yes Has patient had a PCN reaction causing severe rash involving mucus membranes or skin necrosis: No Has patient had a PCN reaction that required hospitalization: No Has patient had a PCN reaction occurring within the last 10 years: Yes If all of the above answers are NO, then may proceed with Cephalosporin use.       Latest Ref Rng & Units 05/22/2022   11:02 AM 05/15/2022    1:52 PM 03/27/2022    4:43 AM  CBC  WBC 3.4 - 10.8 x10E3/uL 7.7  6.1  12.2   Hemoglobin 11.1 - 15.9 g/dL 86.5  87.2  87.6   Hematocrit 34.0 - 46.6 % 40.4  38.4  36.7   Platelets 150 - 450 x10E3/uL 180  169  164       CMP     Component Value Date/Time   NA 141 05/22/2022 1102   K 3.4 (L) 05/22/2022 1102   CL 94 (L) 05/22/2022 1102   CO2 28 05/22/2022 1102   GLUCOSE 128 (H) 05/22/2022 1102   GLUCOSE 170 (H) 03/27/2022 0443   BUN 23 05/22/2022 1102   CREATININE 1.06 (H) 05/22/2022 1102   CREATININE 0.93 04/29/2017 0801   CALCIUM  9.2 05/22/2022 1102   PROT 5.8 (L) 05/22/2022 1102   ALBUMIN  3.9 05/22/2022 1102   AST 17 05/22/2022 1102   ALT 11 05/22/2022 1102   ALKPHOS 88 05/22/2022 1102   BILITOT 0.4 05/22/2022 1102   EGFR 53 (L) 05/22/2022 1102   GFRNONAA >60 03/27/2022 0443   GFRNONAA 61 04/29/2017 0801     No results found.     Assessment & Plan:   1. 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  Duplex ultrasound shows <40% stenosis bilaterally.  Continue antiplatelet therapy as prescribed Continue management of CAD, HTN and Hyperlipidemia Healthy heart diet,  encouraged exercise at least 4 times per week  Follow up in 12 months with duplex ultrasound and physical exam   2. Lymphedema The patient feels that her swelling is worse and it is now becoming painful and uncomfortable for her.  She does not have evidence of cellulitis or weeping but there is concern it could start soon.  Patient request we will place her into bilateral Unna boots to be changed weekly.  3. Essential hypertension Continue antihypertensive medications as already ordered,  these medications have been reviewed and there are no changes at this time.  4. Claudication [facility]) Based on the patient's studies her claudication symptoms are likely neurogenic in nature.  She is advised to follow-up with [facility] for possible options that may help to treat her symptoms.   Current Outpatient Medications on File Prior to Visit  Medication Sig Dispense Refill   acyclovir  (ZOVIRAX ) 400 MG tablet TAKE ONE TABLET BY MOUTH 3 TIMES DAILY FOR 5 DAYS TO START IMMEDIATELY WITH EACH EPISODE OF FEVER BLISTER.MAY ALSO TAKE ONE DAILY FOR PREVENTION 30 tablet 11   albuterol  (VENTOLIN  HFA) 108 (90 Base) MCG/ACT inhaler INHALE 2 PUFFS BY MOUTH EVERY 4 TO 6 HOURS AS NEEDED FOR SHORTNESS OF BREATH 18 Inhaler 5   ALPRAZolam  (XANAX ) 0.25 MG tablet Take by mouth.     amLODipine  (NORVASC ) 5 MG tablet Take 1 tablet by mouth daily.     apixaban  (ELIQUIS ) 5 MG TABS tablet Take 1 tablet (5 mg total) by mouth 2 (two) times daily. 60 tablet 2   ascorbic acid  (VITAMIN C ) 500 MG tablet Take 2,000 mg by mouth daily.      aspirin  81 MG tablet Take 1 tablet (81 mg total) by mouth daily. 30 tablet 2   buPROPion  (WELLBUTRIN  XL) 300 MG 24 hr tablet TAKE 1  TABLET BY MOUTH DAILY 90 tablet 0   chlorthalidone  (HYGROTON ) 25 MG tablet Take 0.5 tablets by mouth daily.     Cholecalciferol  (VITAMIN D ) 2000 units tablet Take 2,000 Units by mouth daily.     CRANBERRY PO Take 1 tablet by mouth daily.     Cyanocobalamin  (B-12) 500 MCG TABS Take 500 mcg by mouth daily.      diazepam  (VALIUM ) 5 MG tablet Take 1 tablet (5 mg total) by mouth every 12 (twelve) hours as needed for anxiety. 20 tablet 0   ELDERBERRY PO Take by mouth daily. Unsure dose / combination vitamin     EPINEPHrine  (EPIPEN  2-PAK) 0.3 mg/0.3 mL IJ SOAJ injection INJECT AS DIRECTED FOR SEVERE ALLERGIC REACTION (Patient taking differently: Inject 0.3 mg into the muscle as needed (for severe allergic reaction).) 1 Device 1   fluocinonide gel (LIDEX) 0.05 % Apply 1 application topically as needed.     furosemide  (LASIX ) 40 MG tablet TAKE ONE TABLET EVERY OTHER DAY 30 tablet 3   isosorbide mononitrate (IMDUR) 30 MG 24 hr tablet Take 30 mg by mouth daily.     levothyroxine  (SYNTHROID ) 100 MCG tablet Take 1 tablet (100 mcg total) by mouth daily. 90 tablet 1   menthol -cetylpyridinium (CEPACOL) 3 MG lozenge Take 1 lozenge (3 mg total) by mouth as needed for sore throat.     methocarbamol  (ROBAXIN ) 500 MG tablet Take 1 tablet (500 mg total) by mouth every 8 (eight) hours as needed for muscle spasms. 20 tablet 0   metoprolol  tartrate (LOPRESSOR ) 25 MG tablet One and one half tabs twice daily 90 tablet 0   mometasone  (ELOCON ) 0.1 % cream Apply 1 application topically daily as needed (Rash). 45 g 2   Multiple Vitamin (MULTI-VITAMINS) TABS Take 1 tablet by mouth daily.     mupirocin  ointment (BACTROBAN ) 2 % Apply 1 Application topically daily. With dressing changes 22 g 0   oxymetazoline  (AFRIN) 0.05 % nasal spray Place 1 spray into both nostrils as needed for congestion.     Pyridoxine HCl (VITAMIN B-6 PO) Take 1 tablet by mouth daily.     rosuvastatin  (CRESTOR ) 20 MG tablet TAKE ONE TABLET (  20 MG) BY MOUTH  EVERY EVENING 90 tablet 3   sertraline  (ZOLOFT ) 25 MG tablet TAKE 1 TABLET BY MOUTH DAILY. SCHEDULE APPT FOR MORE REFILLS 90 tablet 1   tacrolimus  (PROTOPIC ) 0.1 % ointment As needed for rosacea flare ups     tacrolimus  (PROTOPIC ) 0.1 % ointment Apply qd to bid qd prn to lips for dryness 60 g 1   zinc gluconate 50 MG tablet Take 50 mg by mouth daily.      zolpidem  (AMBIEN ) 5 MG tablet Take 1 tablet (5 mg total) by mouth at bedtime as needed. for sleep 30 tablet 0   dextromethorphan-guaiFENesin  (MUCINEX  DM) 30-600 MG 12hr tablet Take 1 tablet by mouth 2 (two) times daily as needed for cough. (Patient not taking: Reported on 02/24/2024)     sulfamethoxazole -trimethoprim  (BACTRIM  DS) 800-160 MG tablet Take 1 tablet by mouth 2 (two) times daily. (Patient not taking: Reported on 02/24/2024) 20 tablet 0   No current facility-administered medications on file prior to visit.    There are no Patient Instructions on file for this visit. No follow-ups on file.   Shaquan Missey E Yeira Gulden, NP

## 2024-02-28 LAB — VAS US ABI WITH/WO TBI
Left ABI: 1.03
Right ABI: 1.06

## 2024-03-02 ENCOUNTER — Encounter (INDEPENDENT_AMBULATORY_CARE_PROVIDER_SITE_OTHER): Payer: Self-pay

## 2024-03-02 ENCOUNTER — Ambulatory Visit (INDEPENDENT_AMBULATORY_CARE_PROVIDER_SITE_OTHER): Admitting: Nurse Practitioner

## 2024-03-02 ENCOUNTER — Ambulatory Visit: Admitting: Dermatology

## 2024-03-02 VITALS — BP 142/78 | HR 72 | Resp 18 | Ht 62.0 in | Wt 200.6 lb

## 2024-03-02 DIAGNOSIS — I890 Lymphedema, not elsewhere classified: Secondary | ICD-10-CM | POA: Diagnosis not present

## 2024-03-02 NOTE — Progress Notes (Signed)
 History of Present Illness  There is no documented history at this time  Assessments & Plan   There are no diagnoses linked to this encounter.    Additional instructions  Subjective:  Patient presents with venous ulcer of the Bilateral lower extremity.    Procedure:  3 layer unna wrap was placed Bilateral lower extremity.   Plan:   Follow up in one week.

## 2024-03-08 DIAGNOSIS — R21 Rash and other nonspecific skin eruption: Secondary | ICD-10-CM | POA: Diagnosis not present

## 2024-03-08 DIAGNOSIS — R110 Nausea: Secondary | ICD-10-CM | POA: Diagnosis not present

## 2024-03-08 DIAGNOSIS — C4A72 Merkel cell carcinoma of left lower limb, including hip: Secondary | ICD-10-CM | POA: Diagnosis not present

## 2024-03-08 DIAGNOSIS — Z923 Personal history of irradiation: Secondary | ICD-10-CM | POA: Diagnosis not present

## 2024-03-08 DIAGNOSIS — R197 Diarrhea, unspecified: Secondary | ICD-10-CM | POA: Diagnosis not present

## 2024-03-08 DIAGNOSIS — Z79899 Other long term (current) drug therapy: Secondary | ICD-10-CM | POA: Diagnosis not present

## 2024-03-09 ENCOUNTER — Ambulatory Visit (INDEPENDENT_AMBULATORY_CARE_PROVIDER_SITE_OTHER): Admitting: Nurse Practitioner

## 2024-03-09 ENCOUNTER — Encounter (INDEPENDENT_AMBULATORY_CARE_PROVIDER_SITE_OTHER): Payer: Self-pay

## 2024-03-09 VITALS — BP 98/60 | HR 80 | Resp 18

## 2024-03-09 DIAGNOSIS — I890 Lymphedema, not elsewhere classified: Secondary | ICD-10-CM | POA: Diagnosis not present

## 2024-03-09 NOTE — Progress Notes (Signed)
 History of Present Illness  There is no documented history at this time  Assessments & Plan   There are no diagnoses linked to this encounter.    Additional instructions  Subjective:  Patient presents with venous ulcer of the Bilateral lower extremity.    Procedure:  3 layer unna wrap was placed Bilateral lower extremity.   Plan:   Follow up in one week.

## 2024-03-16 ENCOUNTER — Encounter (INDEPENDENT_AMBULATORY_CARE_PROVIDER_SITE_OTHER)

## 2024-03-16 ENCOUNTER — Encounter (INDEPENDENT_AMBULATORY_CARE_PROVIDER_SITE_OTHER): Payer: Self-pay | Admitting: Nurse Practitioner

## 2024-03-23 ENCOUNTER — Encounter (INDEPENDENT_AMBULATORY_CARE_PROVIDER_SITE_OTHER): Payer: Self-pay

## 2024-03-23 ENCOUNTER — Ambulatory Visit (INDEPENDENT_AMBULATORY_CARE_PROVIDER_SITE_OTHER): Admitting: Nurse Practitioner

## 2024-03-23 VITALS — BP 122/76 | HR 90 | Resp 18 | Ht 60.0 in | Wt 200.0 lb

## 2024-03-23 DIAGNOSIS — I890 Lymphedema, not elsewhere classified: Secondary | ICD-10-CM | POA: Diagnosis not present

## 2024-03-23 NOTE — Progress Notes (Signed)
 History of Present Illness  There is no documented history at this time  Assessments & Plan   There are no diagnoses linked to this encounter.    Additional instructions  Subjective:  Patient presents with venous ulcer of the Bilateral lower extremity.    Procedure:  3 layer unna wrap was placed Bilateral lower extremity.   Plan:   Follow up in one week.

## 2024-03-27 DIAGNOSIS — C4A72 Merkel cell carcinoma of left lower limb, including hip: Secondary | ICD-10-CM | POA: Diagnosis not present

## 2024-03-27 DIAGNOSIS — R933 Abnormal findings on diagnostic imaging of other parts of digestive tract: Secondary | ICD-10-CM | POA: Diagnosis not present

## 2024-03-27 DIAGNOSIS — J984 Other disorders of lung: Secondary | ICD-10-CM | POA: Diagnosis not present

## 2024-03-27 DIAGNOSIS — R918 Other nonspecific abnormal finding of lung field: Secondary | ICD-10-CM | POA: Diagnosis not present

## 2024-03-27 DIAGNOSIS — R937 Abnormal findings on diagnostic imaging of other parts of musculoskeletal system: Secondary | ICD-10-CM | POA: Diagnosis not present

## 2024-03-29 DIAGNOSIS — Z7901 Long term (current) use of anticoagulants: Secondary | ICD-10-CM | POA: Diagnosis not present

## 2024-03-29 DIAGNOSIS — E785 Hyperlipidemia, unspecified: Secondary | ICD-10-CM | POA: Diagnosis not present

## 2024-03-29 DIAGNOSIS — I10 Essential (primary) hypertension: Secondary | ICD-10-CM | POA: Diagnosis not present

## 2024-03-29 DIAGNOSIS — I2510 Atherosclerotic heart disease of native coronary artery without angina pectoris: Secondary | ICD-10-CM | POA: Diagnosis not present

## 2024-03-29 DIAGNOSIS — R5383 Other fatigue: Secondary | ICD-10-CM | POA: Diagnosis not present

## 2024-03-29 DIAGNOSIS — I4891 Unspecified atrial fibrillation: Secondary | ICD-10-CM | POA: Diagnosis not present

## 2024-03-29 DIAGNOSIS — R21 Rash and other nonspecific skin eruption: Secondary | ICD-10-CM | POA: Diagnosis not present

## 2024-03-29 DIAGNOSIS — R059 Cough, unspecified: Secondary | ICD-10-CM | POA: Diagnosis not present

## 2024-03-29 DIAGNOSIS — C4A72 Merkel cell carcinoma of left lower limb, including hip: Secondary | ICD-10-CM | POA: Diagnosis not present

## 2024-03-29 DIAGNOSIS — E039 Hypothyroidism, unspecified: Secondary | ICD-10-CM | POA: Diagnosis not present

## 2024-03-29 DIAGNOSIS — Z5112 Encounter for antineoplastic immunotherapy: Secondary | ICD-10-CM | POA: Diagnosis not present

## 2024-03-29 DIAGNOSIS — Z87891 Personal history of nicotine dependence: Secondary | ICD-10-CM | POA: Diagnosis not present

## 2024-03-29 DIAGNOSIS — Z79899 Other long term (current) drug therapy: Secondary | ICD-10-CM | POA: Diagnosis not present

## 2024-03-29 DIAGNOSIS — F32A Depression, unspecified: Secondary | ICD-10-CM | POA: Diagnosis not present

## 2024-03-29 DIAGNOSIS — Z951 Presence of aortocoronary bypass graft: Secondary | ICD-10-CM | POA: Diagnosis not present

## 2024-03-29 DIAGNOSIS — Z923 Personal history of irradiation: Secondary | ICD-10-CM | POA: Diagnosis not present

## 2024-03-29 DIAGNOSIS — Z7982 Long term (current) use of aspirin: Secondary | ICD-10-CM | POA: Diagnosis not present

## 2024-03-29 DIAGNOSIS — M1612 Unilateral primary osteoarthritis, left hip: Secondary | ICD-10-CM | POA: Diagnosis not present

## 2024-03-30 ENCOUNTER — Ambulatory Visit (INDEPENDENT_AMBULATORY_CARE_PROVIDER_SITE_OTHER): Admitting: Nurse Practitioner

## 2024-04-19 DIAGNOSIS — M25552 Pain in left hip: Secondary | ICD-10-CM | POA: Diagnosis not present

## 2024-04-19 DIAGNOSIS — C4A72 Merkel cell carcinoma of left lower limb, including hip: Secondary | ICD-10-CM | POA: Diagnosis not present

## 2024-04-19 DIAGNOSIS — S3210XA Unspecified fracture of sacrum, initial encounter for closed fracture: Secondary | ICD-10-CM | POA: Diagnosis not present

## 2024-04-19 DIAGNOSIS — Z7901 Long term (current) use of anticoagulants: Secondary | ICD-10-CM | POA: Diagnosis not present

## 2024-04-19 DIAGNOSIS — X58XXXA Exposure to other specified factors, initial encounter: Secondary | ICD-10-CM | POA: Diagnosis not present

## 2024-04-19 DIAGNOSIS — M1610 Unilateral primary osteoarthritis, unspecified hip: Secondary | ICD-10-CM | POA: Diagnosis not present

## 2024-04-19 DIAGNOSIS — Z96611 Presence of right artificial shoulder joint: Secondary | ICD-10-CM | POA: Diagnosis not present

## 2024-04-19 DIAGNOSIS — F32A Depression, unspecified: Secondary | ICD-10-CM | POA: Diagnosis not present

## 2024-04-19 DIAGNOSIS — R059 Cough, unspecified: Secondary | ICD-10-CM | POA: Diagnosis not present

## 2024-04-19 DIAGNOSIS — E785 Hyperlipidemia, unspecified: Secondary | ICD-10-CM | POA: Diagnosis not present

## 2024-04-19 DIAGNOSIS — Z79899 Other long term (current) drug therapy: Secondary | ICD-10-CM | POA: Diagnosis not present

## 2024-04-19 DIAGNOSIS — Z5112 Encounter for antineoplastic immunotherapy: Secondary | ICD-10-CM | POA: Diagnosis not present

## 2024-04-19 DIAGNOSIS — R918 Other nonspecific abnormal finding of lung field: Secondary | ICD-10-CM | POA: Diagnosis not present

## 2024-04-19 DIAGNOSIS — J811 Chronic pulmonary edema: Secondary | ICD-10-CM | POA: Diagnosis not present

## 2024-04-19 DIAGNOSIS — Z79890 Hormone replacement therapy (postmenopausal): Secondary | ICD-10-CM | POA: Diagnosis not present

## 2024-04-19 DIAGNOSIS — I4891 Unspecified atrial fibrillation: Secondary | ICD-10-CM | POA: Diagnosis not present

## 2024-04-19 DIAGNOSIS — I10 Essential (primary) hypertension: Secondary | ICD-10-CM | POA: Diagnosis not present

## 2024-04-19 DIAGNOSIS — E039 Hypothyroidism, unspecified: Secondary | ICD-10-CM | POA: Diagnosis not present

## 2024-04-19 DIAGNOSIS — R21 Rash and other nonspecific skin eruption: Secondary | ICD-10-CM | POA: Diagnosis not present

## 2024-04-19 DIAGNOSIS — I2510 Atherosclerotic heart disease of native coronary artery without angina pectoris: Secondary | ICD-10-CM | POA: Diagnosis not present

## 2024-04-19 DIAGNOSIS — Z87891 Personal history of nicotine dependence: Secondary | ICD-10-CM | POA: Diagnosis not present

## 2024-04-19 DIAGNOSIS — Z951 Presence of aortocoronary bypass graft: Secondary | ICD-10-CM | POA: Diagnosis not present

## 2024-05-10 DIAGNOSIS — R5383 Other fatigue: Secondary | ICD-10-CM | POA: Diagnosis not present

## 2024-05-10 DIAGNOSIS — I10 Essential (primary) hypertension: Secondary | ICD-10-CM | POA: Diagnosis not present

## 2024-05-10 DIAGNOSIS — E039 Hypothyroidism, unspecified: Secondary | ICD-10-CM | POA: Diagnosis not present

## 2024-05-10 DIAGNOSIS — E785 Hyperlipidemia, unspecified: Secondary | ICD-10-CM | POA: Diagnosis not present

## 2024-05-10 DIAGNOSIS — Z923 Personal history of irradiation: Secondary | ICD-10-CM | POA: Diagnosis not present

## 2024-05-10 DIAGNOSIS — Z79899 Other long term (current) drug therapy: Secondary | ICD-10-CM | POA: Diagnosis not present

## 2024-05-10 DIAGNOSIS — R197 Diarrhea, unspecified: Secondary | ICD-10-CM | POA: Diagnosis not present

## 2024-05-10 DIAGNOSIS — Z5112 Encounter for antineoplastic immunotherapy: Secondary | ICD-10-CM | POA: Diagnosis not present

## 2024-05-10 DIAGNOSIS — Z7982 Long term (current) use of aspirin: Secondary | ICD-10-CM | POA: Diagnosis not present

## 2024-05-10 DIAGNOSIS — Z7901 Long term (current) use of anticoagulants: Secondary | ICD-10-CM | POA: Diagnosis not present

## 2024-05-10 DIAGNOSIS — F32A Depression, unspecified: Secondary | ICD-10-CM | POA: Diagnosis not present

## 2024-05-10 DIAGNOSIS — C4A72 Merkel cell carcinoma of left lower limb, including hip: Secondary | ICD-10-CM | POA: Diagnosis not present

## 2024-05-10 DIAGNOSIS — I2510 Atherosclerotic heart disease of native coronary artery without angina pectoris: Secondary | ICD-10-CM | POA: Diagnosis not present

## 2024-05-10 DIAGNOSIS — Z87891 Personal history of nicotine dependence: Secondary | ICD-10-CM | POA: Diagnosis not present

## 2024-05-10 DIAGNOSIS — I4891 Unspecified atrial fibrillation: Secondary | ICD-10-CM | POA: Diagnosis not present

## 2024-05-10 DIAGNOSIS — K521 Toxic gastroenteritis and colitis: Secondary | ICD-10-CM | POA: Diagnosis not present

## 2024-05-10 DIAGNOSIS — Z951 Presence of aortocoronary bypass graft: Secondary | ICD-10-CM | POA: Diagnosis not present
